# Patient Record
Sex: Female | Born: 1975
Health system: Southern US, Community
[De-identification: ages and names within clinical notes are randomized; demographics above are authoritative.]

## PROBLEM LIST (undated history)

## (undated) DIAGNOSIS — I10 Essential (primary) hypertension: Secondary | ICD-10-CM

## (undated) DIAGNOSIS — D649 Anemia, unspecified: Secondary | ICD-10-CM

## (undated) DIAGNOSIS — G709 Myoneural disorder, unspecified: Secondary | ICD-10-CM

## (undated) DIAGNOSIS — M549 Dorsalgia, unspecified: Secondary | ICD-10-CM

## (undated) DIAGNOSIS — G4700 Insomnia, unspecified: Secondary | ICD-10-CM

## (undated) DIAGNOSIS — G43909 Migraine, unspecified, not intractable, without status migrainosus: Secondary | ICD-10-CM

## (undated) DIAGNOSIS — L309 Dermatitis, unspecified: Secondary | ICD-10-CM

## (undated) DIAGNOSIS — Z9289 Personal history of other medical treatment: Secondary | ICD-10-CM

## (undated) DIAGNOSIS — F32A Depression, unspecified: Secondary | ICD-10-CM

## (undated) DIAGNOSIS — M1990 Unspecified osteoarthritis, unspecified site: Secondary | ICD-10-CM

## (undated) DIAGNOSIS — N938 Other specified abnormal uterine and vaginal bleeding: Secondary | ICD-10-CM

## (undated) DIAGNOSIS — S83281A Other tear of lateral meniscus, current injury, right knee, initial encounter: Secondary | ICD-10-CM

## (undated) DIAGNOSIS — F319 Bipolar disorder, unspecified: Secondary | ICD-10-CM

## (undated) DIAGNOSIS — K219 Gastro-esophageal reflux disease without esophagitis: Secondary | ICD-10-CM

## (undated) DIAGNOSIS — N390 Urinary tract infection, site not specified: Secondary | ICD-10-CM

## (undated) DIAGNOSIS — F329 Major depressive disorder, single episode, unspecified: Secondary | ICD-10-CM

## (undated) DIAGNOSIS — F419 Anxiety disorder, unspecified: Secondary | ICD-10-CM

## (undated) HISTORY — PX: TUBAL LIGATION: SHX77

## (undated) HISTORY — DX: Anxiety disorder, unspecified: F41.9

## (undated) HISTORY — DX: Depression, unspecified: F32.A

## (undated) HISTORY — DX: Essential (primary) hypertension: I10

## (undated) HISTORY — DX: Migraine, unspecified, not intractable, without status migrainosus: G43.909

## (undated) HISTORY — DX: Insomnia, unspecified: G47.00

## (undated) HISTORY — DX: Bipolar disorder, unspecified: F31.9

## (undated) HISTORY — DX: Major depressive disorder, single episode, unspecified: F32.9

## (undated) HISTORY — DX: Other specified abnormal uterine and vaginal bleeding: N93.8

---

## 1999-01-01 ENCOUNTER — Emergency Department (HOSPITAL_COMMUNITY): Admission: EM | Admit: 1999-01-01 | Discharge: 1999-01-01 | Payer: Self-pay | Admitting: Emergency Medicine

## 2000-01-23 ENCOUNTER — Emergency Department (HOSPITAL_COMMUNITY): Admission: EM | Admit: 2000-01-23 | Discharge: 2000-01-23 | Payer: Self-pay | Admitting: Emergency Medicine

## 2001-08-28 ENCOUNTER — Encounter: Payer: Self-pay | Admitting: Family Medicine

## 2001-08-28 ENCOUNTER — Ambulatory Visit (HOSPITAL_COMMUNITY): Admission: RE | Admit: 2001-08-28 | Discharge: 2001-08-28 | Payer: Self-pay | Admitting: Anesthesiology

## 2001-12-11 ENCOUNTER — Emergency Department (HOSPITAL_COMMUNITY): Admission: EM | Admit: 2001-12-11 | Discharge: 2001-12-11 | Payer: Self-pay | Admitting: Emergency Medicine

## 2001-12-21 ENCOUNTER — Emergency Department (HOSPITAL_COMMUNITY): Admission: EM | Admit: 2001-12-21 | Discharge: 2001-12-21 | Payer: Self-pay | Admitting: Emergency Medicine

## 2003-02-04 ENCOUNTER — Emergency Department (HOSPITAL_COMMUNITY): Admission: EM | Admit: 2003-02-04 | Discharge: 2003-02-04 | Payer: Self-pay | Admitting: Emergency Medicine

## 2003-04-07 ENCOUNTER — Emergency Department (HOSPITAL_COMMUNITY): Admission: EM | Admit: 2003-04-07 | Discharge: 2003-04-08 | Payer: Self-pay | Admitting: Emergency Medicine

## 2003-06-25 ENCOUNTER — Inpatient Hospital Stay (HOSPITAL_COMMUNITY): Admission: AD | Admit: 2003-06-25 | Discharge: 2003-06-25 | Payer: Self-pay | Admitting: Obstetrics & Gynecology

## 2004-02-03 ENCOUNTER — Inpatient Hospital Stay (HOSPITAL_COMMUNITY): Admission: AD | Admit: 2004-02-03 | Discharge: 2004-02-03 | Payer: Self-pay | Admitting: Obstetrics and Gynecology

## 2004-02-11 ENCOUNTER — Ambulatory Visit: Payer: Self-pay | Admitting: Obstetrics and Gynecology

## 2004-02-29 ENCOUNTER — Ambulatory Visit: Payer: Self-pay | Admitting: Sports Medicine

## 2004-02-29 ENCOUNTER — Ambulatory Visit (HOSPITAL_COMMUNITY): Admission: RE | Admit: 2004-02-29 | Discharge: 2004-02-29 | Payer: Self-pay | Admitting: Family Medicine

## 2004-04-05 ENCOUNTER — Ambulatory Visit: Payer: Self-pay | Admitting: Family Medicine

## 2004-06-07 ENCOUNTER — Ambulatory Visit: Payer: Self-pay | Admitting: Family Medicine

## 2004-11-01 ENCOUNTER — Ambulatory Visit: Payer: Self-pay | Admitting: Sports Medicine

## 2004-11-03 ENCOUNTER — Ambulatory Visit: Payer: Self-pay | Admitting: Sports Medicine

## 2004-11-04 ENCOUNTER — Ambulatory Visit: Payer: Self-pay | Admitting: Family Medicine

## 2005-12-29 ENCOUNTER — Emergency Department (HOSPITAL_COMMUNITY): Admission: EM | Admit: 2005-12-29 | Discharge: 2005-12-29 | Payer: Self-pay | Admitting: Emergency Medicine

## 2006-03-06 ENCOUNTER — Inpatient Hospital Stay (HOSPITAL_COMMUNITY): Admission: AD | Admit: 2006-03-06 | Discharge: 2006-03-06 | Payer: Self-pay | Admitting: Obstetrics and Gynecology

## 2006-08-02 ENCOUNTER — Emergency Department (HOSPITAL_COMMUNITY): Admission: EM | Admit: 2006-08-02 | Discharge: 2006-08-02 | Payer: Self-pay | Admitting: Emergency Medicine

## 2006-10-20 ENCOUNTER — Emergency Department (HOSPITAL_COMMUNITY): Admission: EM | Admit: 2006-10-20 | Discharge: 2006-10-20 | Payer: Self-pay | Admitting: Emergency Medicine

## 2006-11-10 ENCOUNTER — Emergency Department (HOSPITAL_COMMUNITY): Admission: EM | Admit: 2006-11-10 | Discharge: 2006-11-10 | Payer: Self-pay | Admitting: Emergency Medicine

## 2006-12-20 ENCOUNTER — Other Ambulatory Visit: Admission: RE | Admit: 2006-12-20 | Discharge: 2006-12-20 | Payer: Self-pay | Admitting: Family Medicine

## 2006-12-20 ENCOUNTER — Ambulatory Visit: Payer: Self-pay | Admitting: Family Medicine

## 2006-12-20 ENCOUNTER — Encounter: Payer: Self-pay | Admitting: Family Medicine

## 2006-12-20 DIAGNOSIS — I10 Essential (primary) hypertension: Secondary | ICD-10-CM | POA: Insufficient documentation

## 2006-12-20 LAB — CONVERTED CEMR LAB: Pap Smear: NORMAL

## 2006-12-21 ENCOUNTER — Encounter: Payer: Self-pay | Admitting: Family Medicine

## 2006-12-21 ENCOUNTER — Ambulatory Visit: Payer: Self-pay | Admitting: Family Medicine

## 2006-12-21 LAB — CONVERTED CEMR LAB
ALT: <8 units/L (ref 0–35)
AST: 12 units/L (ref 0–37)
Albumin: 4.1 g/dL (ref 3.5–5.2)
Alkaline Phosphatase: 49 units/L (ref 39–117)
BUN: 13 mg/dL (ref 6–23)
CO2: 22 meq/L (ref 19–32)
Calcium: 8.8 mg/dL (ref 8.4–10.5)
Chloride: 106 meq/L (ref 96–112)
Cholesterol: 190 mg/dL (ref 0–200)
Creatinine, Ser: 0.72 mg/dL (ref 0.40–1.20)
Glucose, Bld: 82 mg/dL (ref 70–99)
HCT: 44.3 % (ref 36.0–46.0)
HDL: 63 mg/dL (ref >39)
Hemoglobin: 14.7 g/dL (ref 12.0–15.0)
LDL Cholesterol: 111 mg/dL — ABNORMAL HIGH (ref 0–99)
MCHC: 33.2 g/dL (ref 30.0–36.0)
MCV: 88.6 fL (ref 78.0–100.0)
Platelets: 232 10*3/uL (ref 150–400)
Potassium: 4.3 meq/L (ref 3.5–5.3)
RBC: 5 M/uL (ref 3.87–5.11)
RDW: 13.8 % (ref 11.5–14.0)
Sodium: 138 meq/L (ref 135–145)
TSH: 0.394 microintl units/mL (ref 0.350–5.50)
Total Bilirubin: 0.8 mg/dL (ref 0.3–1.2)
Total CHOL/HDL Ratio: 3
Total Protein: 6.7 g/dL (ref 6.0–8.3)
Triglycerides: 80 mg/dL (ref <150)
VLDL: 16 mg/dL (ref 0–40)
WBC: 5.6 10*3/uL (ref 4.0–10.5)

## 2006-12-24 ENCOUNTER — Telehealth: Payer: Self-pay | Admitting: Family Medicine

## 2006-12-27 ENCOUNTER — Encounter: Payer: Self-pay | Admitting: Family Medicine

## 2007-03-07 ENCOUNTER — Emergency Department (HOSPITAL_COMMUNITY): Admission: EM | Admit: 2007-03-07 | Discharge: 2007-03-07 | Payer: Self-pay | Admitting: Emergency Medicine

## 2007-03-18 ENCOUNTER — Ambulatory Visit: Payer: Self-pay | Admitting: Sports Medicine

## 2007-03-19 ENCOUNTER — Ambulatory Visit: Payer: Self-pay | Admitting: Family Medicine

## 2007-03-20 ENCOUNTER — Ambulatory Visit: Payer: Self-pay | Admitting: Family Medicine

## 2008-04-06 ENCOUNTER — Ambulatory Visit: Payer: Self-pay | Admitting: Family Medicine

## 2008-04-08 ENCOUNTER — Ambulatory Visit: Payer: Self-pay | Admitting: Family Medicine

## 2008-04-28 ENCOUNTER — Telehealth: Payer: Self-pay | Admitting: Family Medicine

## 2008-08-11 ENCOUNTER — Emergency Department (HOSPITAL_COMMUNITY): Admission: EM | Admit: 2008-08-11 | Discharge: 2008-08-11 | Payer: Self-pay | Admitting: Family Medicine

## 2008-09-09 ENCOUNTER — Telehealth: Payer: Self-pay | Admitting: Family Medicine

## 2008-10-29 ENCOUNTER — Ambulatory Visit: Payer: Self-pay | Admitting: Family Medicine

## 2008-10-29 ENCOUNTER — Encounter: Payer: Self-pay | Admitting: Family Medicine

## 2008-10-29 DIAGNOSIS — E66811 Obesity, class 1: Secondary | ICD-10-CM | POA: Insufficient documentation

## 2008-10-29 DIAGNOSIS — E669 Obesity, unspecified: Secondary | ICD-10-CM | POA: Insufficient documentation

## 2008-11-03 ENCOUNTER — Encounter: Payer: Self-pay | Admitting: Family Medicine

## 2008-11-03 LAB — CONVERTED CEMR LAB
BUN: 16 mg/dL (ref 6–23)
CO2: 26 meq/L (ref 19–32)
Calcium: 9 mg/dL (ref 8.4–10.5)
Chloride: 103 meq/L (ref 96–112)
Creatinine, Ser: 0.77 mg/dL (ref 0.40–1.20)
Glucose, Bld: 86 mg/dL (ref 70–99)
Potassium: 4 meq/L (ref 3.5–5.3)
Sodium: 141 meq/L (ref 135–145)

## 2008-12-24 ENCOUNTER — Emergency Department (HOSPITAL_COMMUNITY): Admission: EM | Admit: 2008-12-24 | Discharge: 2008-12-24 | Payer: Self-pay | Admitting: Emergency Medicine

## 2009-01-13 ENCOUNTER — Telehealth: Payer: Self-pay | Admitting: Family Medicine

## 2009-01-13 ENCOUNTER — Ambulatory Visit: Payer: Self-pay | Admitting: Family Medicine

## 2009-01-23 ENCOUNTER — Encounter (INDEPENDENT_AMBULATORY_CARE_PROVIDER_SITE_OTHER): Payer: Self-pay | Admitting: *Deleted

## 2009-01-23 DIAGNOSIS — F17200 Nicotine dependence, unspecified, uncomplicated: Secondary | ICD-10-CM | POA: Insufficient documentation

## 2009-03-13 DIAGNOSIS — S83519A Sprain of anterior cruciate ligament of unspecified knee, initial encounter: Secondary | ICD-10-CM

## 2009-03-13 HISTORY — DX: Sprain of anterior cruciate ligament of unspecified knee, initial encounter: S83.519A

## 2009-06-29 ENCOUNTER — Ambulatory Visit: Payer: Self-pay | Admitting: Family Medicine

## 2009-06-29 DIAGNOSIS — G4700 Insomnia, unspecified: Secondary | ICD-10-CM | POA: Insufficient documentation

## 2009-09-08 ENCOUNTER — Telehealth: Payer: Self-pay | Admitting: Family Medicine

## 2009-09-08 ENCOUNTER — Ambulatory Visit: Payer: Self-pay | Admitting: Family Medicine

## 2009-09-08 LAB — CONVERTED CEMR LAB: Beta hcg, urine, semiquantitative: POSITIVE

## 2009-09-09 ENCOUNTER — Telehealth: Payer: Self-pay | Admitting: Family Medicine

## 2009-09-28 ENCOUNTER — Ambulatory Visit: Payer: Self-pay | Admitting: Family Medicine

## 2009-10-06 ENCOUNTER — Telehealth: Payer: Self-pay | Admitting: Family Medicine

## 2009-10-27 ENCOUNTER — Emergency Department (HOSPITAL_COMMUNITY)
Admission: EM | Admit: 2009-10-27 | Discharge: 2009-10-27 | Payer: Self-pay | Source: Home / Self Care | Admitting: Emergency Medicine

## 2009-10-27 ENCOUNTER — Emergency Department (HOSPITAL_COMMUNITY): Admission: EM | Admit: 2009-10-27 | Discharge: 2009-10-27 | Payer: Self-pay | Admitting: Emergency Medicine

## 2009-10-31 ENCOUNTER — Emergency Department (HOSPITAL_COMMUNITY): Admission: EM | Admit: 2009-10-31 | Discharge: 2009-10-31 | Payer: Self-pay | Admitting: Emergency Medicine

## 2009-11-03 ENCOUNTER — Ambulatory Visit: Payer: Self-pay | Admitting: Family Medicine

## 2009-11-03 DIAGNOSIS — G8929 Other chronic pain: Secondary | ICD-10-CM | POA: Insufficient documentation

## 2009-11-03 DIAGNOSIS — M25569 Pain in unspecified knee: Secondary | ICD-10-CM | POA: Insufficient documentation

## 2009-11-04 ENCOUNTER — Ambulatory Visit (HOSPITAL_COMMUNITY): Admission: RE | Admit: 2009-11-04 | Discharge: 2009-11-04 | Payer: Self-pay | Admitting: Family Medicine

## 2009-11-05 ENCOUNTER — Telehealth: Payer: Self-pay | Admitting: Family Medicine

## 2009-11-05 DIAGNOSIS — M23349 Other meniscus derangements, anterior horn of lateral meniscus, unspecified knee: Secondary | ICD-10-CM | POA: Insufficient documentation

## 2009-11-11 ENCOUNTER — Telehealth: Payer: Self-pay | Admitting: *Deleted

## 2009-11-12 ENCOUNTER — Encounter: Payer: Self-pay | Admitting: Family Medicine

## 2009-11-12 ENCOUNTER — Ambulatory Visit: Payer: Self-pay | Admitting: Family Medicine

## 2009-11-17 ENCOUNTER — Encounter (INDEPENDENT_AMBULATORY_CARE_PROVIDER_SITE_OTHER): Payer: Self-pay | Admitting: *Deleted

## 2009-11-17 ENCOUNTER — Encounter: Payer: Self-pay | Admitting: Family Medicine

## 2009-11-17 ENCOUNTER — Ambulatory Visit: Payer: Self-pay | Admitting: Sports Medicine

## 2009-11-17 DIAGNOSIS — S83006A Unspecified dislocation of unspecified patella, initial encounter: Secondary | ICD-10-CM | POA: Insufficient documentation

## 2009-12-08 ENCOUNTER — Ambulatory Visit: Payer: Self-pay | Admitting: Sports Medicine

## 2009-12-08 ENCOUNTER — Encounter: Payer: Self-pay | Admitting: Family Medicine

## 2009-12-08 ENCOUNTER — Encounter (INDEPENDENT_AMBULATORY_CARE_PROVIDER_SITE_OTHER): Payer: Self-pay | Admitting: *Deleted

## 2009-12-09 ENCOUNTER — Telehealth: Payer: Self-pay | Admitting: Family Medicine

## 2009-12-27 ENCOUNTER — Encounter
Admission: RE | Admit: 2009-12-27 | Discharge: 2010-01-11 | Payer: Self-pay | Source: Home / Self Care | Admitting: Family Medicine

## 2009-12-27 ENCOUNTER — Encounter: Payer: Self-pay | Admitting: Family Medicine

## 2010-04-14 NOTE — Progress Notes (Signed)
  Phone Note Refill Request Call back at [phone]   Refills Requested: Medication #1:  PERCOCET 5-325 MG TABS Take 1-2 tabs by mouth every 6 hours as needed for pain Patient out of pain meds and need refilled.  Please send to [facility] on [address]  Initial call taken by: Abundio Miu,  December 09, 2009 2:47 PM  Follow-up for Phone Call        Narcotic, did not call this in.  Placed prescription in "to be called" box so that patient would be contacted to pick this up.  If she requires further pain medications she should make an appointment Follow-up by: Renold Don MD,  December 09, 2009 8:45 PM  Additional Follow-up for Phone Call Additional follow up Details #1::        Pt notified that rx ready for pick up and if more is required appt will need to be made. Additional Follow-up by: Clydell Hakim,  December 10, 2009 10:00 AM    Prescriptions: PERCOCET 5-325 MG TABS (OXYCODONE-ACETAMINOPHEN) Take 1-2 tabs by mouth every 6 hours as needed for pain  #45 x 0   Entered and Authorized by:   Renold Don MD   Signed by:   Renold Don MD on 12/09/2009   Method used:   Print then Give to Patient   RxID:   [id number]

## 2010-04-14 NOTE — Letter (Signed)
Summary: Out of Work  [facility]  [address]   [place], [place] [address]   Phone: [phone]  Fax: [phone]    November 17, 2009   Employee:  [employer]    To Whom It May Concern:   For Medical reasons, please excuse the above named employee from work for the following dates:  Start:  November 17, 2009 3:25 PM   End:  December 08, 2009   If you need additional information, please feel free to contact our office.         Sincerely,    Lillia Pauls CMA

## 2010-04-14 NOTE — Assessment & Plan Note (Signed)
Summary: f/u,df   Vital Signs:  Patient profile:   35 year old female Height:      65 inches Weight:      179.0 pounds BMI:     29.89 Temp:     98.6 degrees F oral Pulse rate:   103 / minute BP sitting:   165 / 117  (left arm) Cuff size:   regular  Vitals Entered By: Gladstone Pih (September 28, 2009 2:39 PM)  Serial Vital Signs/Assessments:  Comments: 3:08 PM Manual BP: 148/96 By: Garen Grams LPN   CC: F/U, discuss preg and HTN med change Is Patient Diabetic? No Pain Assessment Patient in pain? no        Primary Care Provider:  Marisue Ivan  MD  CC:  F/U and discuss preg and HTN med change.  History of Present Illness: Patient here to discuss that she is pregnant and hypertension:  1.  Pregnancy:  Patient with LMP in June 15.  States she took home pregnancy test, verified here in office.  Unsure whether she wants to keep pregnancy .Taking prenatal vitamins.  Has information on various agencies in town that perform abortions.    2.  Hypertension:  measures blood pressures at home.  States they have been running 140s/150s, higher since being taken off HCTZ and placed on Labetolol.  States she is taking her medicine every single day twice daily as recommended.  Occasional lightheadedness when pressures high.  ROS:  no headaches, vision changes, chest pain, dyspnea, nausea/vomiting, changes in bowel habits, lower extremity edema   Habits & Providers  Alcohol-Tobacco-Diet     Tobacco Status: current     Tobacco Counseling: to quit use of tobacco products     Cigarette Packs/Day: <0.25  Current Problems (verified): 1)  Pregnant State, Incidental  (ICD-V22.2) 2)  Insomnia, Chronic  (ICD-307.42) 3)  Back Pain, Lumbar  (ICD-724.2) 4)  Tobacco User  (ICD-305.1) 5)  Obesity  (ICD-278.00) 6)  Preventive Health Care  (ICD-V70.0) 7)  Hypertension  (ICD-401.9)  Current Medications (verified): 1)  Naproxen 500 Mg Tabs (Naproxen) .Marland Kitchen.. 1 Tab By Mouth Two Times A Day With  Food X 10 Days 2)  Labetalol Hcl 100 Mg Tabs (Labetalol Hcl) .... Two Tabs in Am and 1 Tab in Pm 3)  Ondansetron Hcl 4 Mg Tabs (Ondansetron Hcl) .... One Tab By Mouth Q6 Hrs As Needed Nausea 4)  Promethazine Hcl 12.5 Mg Tabs (Promethazine Hcl) .... One Tab By Mouth Q 6hrs As Needed Nausea 5)  Prenavite Multiple Vitamin 28-0.8 Mg Tabs (Prenatal Vit-Fe Fumarate-Fa) .... One Tab By Mouth Daily  Allergies (verified): 1)  ! Imitrex (Sumatriptan Succinate)  Past History:  Past medical, surgical, family and social histories (including risk factors) reviewed, and no changes noted (except as noted below).  Past Medical History: Reviewed history from 12/20/2006 and no changes required. 1. HTN 2. Allergic rhinitis 3. Migraines  Past Surgical History: Reviewed history from 12/20/2006 and no changes required. BTL  Family History: Reviewed history from 12/20/2006 and no changes required. 1. Sister- bipolar 2. Cousins- bipolar 3. Mother- HTN 4. Brother- Htn  Social History: Reviewed history from 12/20/2006 and no changes required. Lives in apt with 17yo daughter, 15yo son, 11yo daughter.  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  occasional EtOH.  No other drugsPacks/Day:  <0.25  Physical Exam  General:  Vital signs reviewed. Well-developed, well-nourished patient in NAD.  Awake and cooperative  Mouth:  oral mucosa moist Lungs:  clear to auscultation bilaterally without wheezing, rales, or rhonchi.  Normal work of breathing  Heart:  Regular rate and rhythm without murmur, rub, or gallop.  Normal S1/S2  Abdomen:  soft/ND/NT.  Fundal height non-palpable, not able to auscultate FHTs.  good BS   Impression & Recommendations:  Problem # 1:  PREGNANT STATE, INCIDENTAL (ICD-V22.2) Assessment Unchanged Patient needs prenatal labs drawn.  Declined blood draw today, fear of needles.  Discussed with her importance of this.  Will need official new OB visit as well as prenatal lab draws  prior to being seen at [facility], where patient needs to be referred due to pre-gestational HTN.  Currrently doing well, no complaints, emesis improved.  Patient still undecided over pregnancy, urged her to continue taking PNV while she makes decision and to stop smoking.   Orders: Obstetric Referral (Obstetric) Other OB visit- [facility] ([facility])  Problem # 2:  HYPERTENSION (ICD-401.9) Assessment: Deteriorated Patient with worsening HTN after being taken off HCTZ.  Plan to increase Labetolol today, want to see her back next week but patient states it will have to be 2 weeks due to finances.  Even after pressing her on this issues, patient adament 2 week return visit.  No current red flags or symptoms of pre-eclampsia.  Will recheck blood pressure at that time as well as obtain UA for protein and prenatal labs as above.   Her updated medication list for this problem includes:    Labetalol Hcl 100 Mg Tabs (Labetalol hcl) .Marland Kitchen..Marland Kitchen Two tabs in am and 1 tab in pm  Orders: Obstetric Referral (Obstetric)  Complete Medication List: 1)  Naproxen 500 Mg Tabs (Naproxen) .Marland Kitchen.. 1 tab by mouth two times a day with food x 10 days 2)  Labetalol Hcl 100 Mg Tabs (Labetalol hcl) .... Two tabs in am and 1 tab in pm 3)  Ondansetron Hcl 4 Mg Tabs (Ondansetron hcl) .... One tab by mouth q6 hrs as needed nausea 4)  Promethazine Hcl 12.5 Mg Tabs (Promethazine hcl) .... One tab by mouth q 6hrs as needed nausea 5)  Prenavite Multiple Vitamin 28-0.8 Mg Tabs (Prenatal vit-fe fumarate-fa) .... One tab by mouth daily Prescriptions: LABETALOL HCL 100 MG TABS (LABETALOL HCL) two tabs in AM and 1 tab in PM  #60 x 2   Entered and Authorized by:   Renold Don MD   Signed by:   Renold Don MD on 09/28/2009   Method used:   Electronically to        [facility] [phone]* (retail)       [address]       [place], [place]  [address]       Ph: [phone]       Fax: [phone]   RxID:   [id number]
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
## 2010-04-14 NOTE — Progress Notes (Signed)
Summary: referral prob  Phone Note Call from Patient Call back at Home Phone [phone]   Caller: Patient Summary of Call: pt has [facility] and [facility] doesn't take that - needs to go somewhere else  also needs a note to go back to work next week instead of tomorrow Initial call taken by: De Nurse,  November 11, 2009 10:43 AM  Follow-up for Phone Call        Informed pt that referral would have to go thru [facility], [place], or [place]. Pt c/o of increased pain and doesnt believe she can go back to work Monday. Will be in tomm (9/2) to address these concerns with PCP. Follow-up by: Garen Grams LPN,  November 11, 2009 4:51 PM

## 2010-04-14 NOTE — Progress Notes (Signed)
Summary: triage  Phone Note Call from Patient Call back at Home Phone [phone]   Caller: Patient Summary of Call: n/v - stomach pain - wants to come in today Initial call taken by: De Nurse,  September 08, 2009 11:10 AM  Follow-up for Phone Call        sick x 6 days. states she is feeling worse. appt today in work in . aware of wait Follow-up by: Golden Circle RN,  September 08, 2009 11:34 AM

## 2010-04-14 NOTE — Progress Notes (Signed)
Summary: meds prob  Phone Note Call from Patient Call back at Home Phone [phone]   Caller: Patient Summary of Call: pt cannot afford meds from yesterday - needs it called into [facility]  Initial call taken by: De Nurse,  September 09, 2009 8:34 AM  Follow-up for Phone Call        LM for HD to call me back. need to know if they will fill rxs for person with orange card. called pt & told her what I have done so far. told her I will call her back with the answer when I hear from them states she does not feel any better Follow-up by: Golden Circle RN,  September 09, 2009 8:55 AM  Additional Follow-up for Phone Call Additional follow up Details #1::        spoke with pharmacist. they only have & will fill the phenergan. called pt about the others told her the labetalol is on a low cost plan at [facility]. she asked for them to be [facility]  told her the ondansetron will never be on a low cost list. she is appyling for medicaid since she is pregnant. states she is on her way there now.  told her medicaid only pays for this drug if she is post op or undergoing chemo. she is unable to pay cash. once she gets the pink medicaid they will pay for prenatal  vits. can buy it herself today or get with medicaid. she called back asking about the naproxyn. told her she cannot take it sinc she is pregnant. told her tylenol only for any aches/pains. Additional Follow-up by: Golden Circle RN,  September 09, 2009 1:57 PM    Additional Follow-up for Phone Call Additional follow up Details #2::    Sent labetalol to requested pharmacy. Please let patient know. thanks!  Follow-up by: Bobby Rumpf  MD,  September 10, 2009 11:49 AM  Prescriptions: LABETALOL HCL 100 MG TABS (LABETALOL HCL) one tab by mouth two times a day  #60 x 1   Entered and Authorized by:   Bobby Rumpf  MD   Signed by:   Bobby Rumpf  MD on 09/10/2009   Method used:   Electronically to        [facility]  [address] [phone]* (retail)       [address] [place], [place]  [address]       Ph: [phone]       Fax: [phone]   RxID:   [id number] PROMETHAZINE HCL 12.5 MG TABS (PROMETHAZINE HCL) one tab by mouth q 6hrs as needed nausea  #30 x 1   Entered by:   Golden Circle RN   Authorized by:   Bobby Rumpf  MD   Signed by:   Golden Circle RN on 09/09/2009   Method used:   Printed then faxed to ...       [facility] (retail)       [address] [place], [place]  [address]       Ph: [phone]       Fax: [phone]   RxID:   [id number]

## 2010-04-14 NOTE — Miscellaneous (Signed)
Summary: [facility]  [facility]   Imported By: Marily Memos 01/13/2010 15:32:31  _____________________________________________________________________  External Attachment:    Type:   Image     Comment:   External Document

## 2010-04-14 NOTE — Miscellaneous (Signed)
Summary: [facility]  [facility]   Imported By: Marily Memos 01/12/2010 09:06:27  _____________________________________________________________________  External Attachment:    Type:   Image     Comment:   External Document

## 2010-04-14 NOTE — Letter (Signed)
Summary: [facility] PT Referral form  [facility] PT Referral form   Imported By: Marily Memos 12/09/2009 10:35:14  _____________________________________________________________________  External Attachment:    Type:   Image     Comment:   External Document

## 2010-04-14 NOTE — Assessment & Plan Note (Signed)
Summary: MENISCAL PAIN/MJD   Vital Signs:  Patient profile:   35 year old female BP sitting:   129 / 84  Vitals Entered By: Lillia Pauls CMA (November 17, 2009 2:42 PM)  Primary Provider:  Renold Don MD   History of Present Illness: 35 yo F with 3 weeks of R knee pain s/p fall referred by PCP Renold Don. Slipped and fell backward 3 weeks ago, had immediate pain and swelling after getting up. Has continued to have diffuse pain and swelling. MRI that showed possible flap tear of ant horn lat meniscuse, MPFL and medial patellar retinacular tear, grade II MCL and LCL injuries, chondral bruising, and possible posterolateral corner injury. Still using crutches to get up and down steps.  Using an ACE wrap bandage. Alternating ice and heat.  Elevating as well. Does not remember patella dislocating, but states it has happened in the past.  Also lost her pregnancy with this fall.   ~ [redacted] weeks pregnant.   Allergies: 1)  ! Imitrex (Sumatriptan Succinate)  Past History:  Past Medical History: Last updated: 12/20/2006 1. HTN 2. Allergic rhinitis 3. Migraines  Past Surgical History: Last updated: 12/20/2006 BTL  Family History: Last updated: 12/20/2006 1. Sister- bipolar 2. Cousins- bipolar 3. Mother- HTN 4. Brother- Htn  Social History: Last updated: 12/20/2006 Lives in apt with 17yo daughter, 15yo son, 11yo daughter.  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  occasional EtOH.  No other drugs  Risk Factors: Smoking Status: current (11/12/2009) Packs/Day: <0.25 (11/12/2009)  Physical Exam  General:  Well-developed,well-nourished,in no acute distress; alert,appropriate and cooperative throughout examination Head:  normocephalic.   Eyes:  vision grossly intact.   Nose:  no external deformity.   Neck:  supple.   Lungs:  normal respiratory effort.   Abdomen:  soft.   Msk:  R Knee: No erythema, + moderate effusion. Palpation with b/l mild joint line ttp and  diffuse condylar ttp. ROM decreased with extension at  ~3-5 deg and flex to 100-110 deg. ACL and PCL intact.  MCL and LCL with pain and mild laxity compared to contralateral side. Equivocal McMurrays difficult to ascertain based on pain and swelling.  - bounce home. Mildly painful patellar compression, + apprehension.  Neurologic:  alert & oriented X3.     Impression & Recommendations:  Problem # 1:  KNEE PAIN, RIGHT (ICD-719.46)  Multiple factors contributing to her pain after this fall.  Most concerning are MPFL and med patellar retinacular tears which increase risk of repeat patellar dislocation, as well as possible meniscal tear.  Overall, pt does feel her pain and swelling are improved. - knee immobilzer x 3 weeks - isometric quad strengthening exercises - pt states she may have medicaid in 3 weeks, and if still in considerable pain with unstable knee, would refer for ortho consult at her f/u visit - f/u 3 weeks  Her updated medication list for this problem includes:    Naproxen 500 Mg Tabs (Naproxen) .Marland Kitchen... 1 tab by mouth two times a day with food x 10 days    Percocet 5-325 Mg Tabs (Oxycodone-acetaminophen) .Marland Kitchen... Take 1-2 tabs by mouth every 6 hours as needed for pain  Her updated medication list for this problem includes:    Naproxen 500 Mg Tabs (Naproxen) .Marland Kitchen... 1 tab by mouth two times a day with food x 10 days    Percocet 5-325 Mg Tabs (Oxycodone-acetaminophen) .Marland Kitchen... Take 1-2 tabs by mouth every 6 hours as needed for  pain  Problem # 2:  DERANGEMENT OF ANTERIOR HORN OF LATERAL MENISCUS (ICD-717.42) Unclear from MRI if truly has bucket handle tear - knee immobilizer x 3 weeks then f/u at [facility] - would consider scope and menisectomy if still in pain in 3 weeks  Orders: Knee Immobilizer any size (O9629)  Problem # 3:  PATELLAR DISLOCATION, RIGHT (ICD-836.3) Based on MRI findings, very concerning for repeat patellar dislocation.  Since has had another prior to this one, she is  at large risk of recurrent dislocations and really needs MPFL and retinaculum fixed.  However, currently without good insurance. - knee immobilizer x 3 weeks - if no better, ortho consult whenever gets medicaid - f/u 3 weeks  Complete Medication List: 1)  Naproxen 500 Mg Tabs (Naproxen) .Marland Kitchen.. 1 tab by mouth two times a day with food x 10 days 2)  Ondansetron Hcl 4 Mg Tabs (Ondansetron hcl) .... One tab by mouth q6 hrs as needed nausea 3)  Promethazine Hcl 12.5 Mg Tabs (Promethazine hcl) .... One tab by mouth q 6hrs as needed nausea 4)  Prenavite Multiple Vitamin 28-0.8 Mg Tabs (Prenatal vit-fe fumarate-fa) .... One tab by mouth daily 5)  Percocet 5-325 Mg Tabs (Oxycodone-acetaminophen) .... Take 1-2 tabs by mouth every 6 hours as needed for pain 6)  Hydrochlorothiazide 25 Mg Tabs (Hydrochlorothiazide) .... Take 1 pill daily po

## 2010-04-14 NOTE — Letter (Signed)
Summary: *Consult Note  [facility]  [address]   [place], [place] [address]   Phone: [phone]  Fax: [phone]    Re:    Kristin [name] DOB:    15-May-1975   Dear: Dr. Gwendolyn Grant   Thank you for requesting that we see the above patient for consultation.  A copy of the detailed office note will be sent under separate cover, for your review.  Evaluation today is consistent with: R knee pain with multiple damaged structures   Our recommendation is for: immobilizer for 3 weeks and then follow up in sports med clinic   New Orders include:    New Medications started today include:    After today's visit, the patients current medications include: 1)  NAPROXEN 500 MG TABS (NAPROXEN) 1 tab by mouth two times a day with food x 10 days 2)  ONDANSETRON HCL 4 MG TABS (ONDANSETRON HCL) one tab by mouth q6 hrs as needed nausea 3)  PROMETHAZINE HCL 12.5 MG TABS (PROMETHAZINE HCL) one tab by mouth q 6hrs as needed nausea 4)  PRENAVITE MULTIPLE VITAMIN 28-0.8 MG TABS (PRENATAL VIT-FE FUMARATE-FA) one tab by mouth daily 5)  PERCOCET 5-325 MG TABS (OXYCODONE-ACETAMINOPHEN) Take 1-2 tabs by mouth every 6 hours as needed for pain 6)  HYDROCHLOROTHIAZIDE 25 MG TABS (HYDROCHLOROTHIAZIDE) Take 1 pill daily po   Thank you for this consultation.  If you have any further questions regarding the care of this patient, please do not hesitate to contact me @ [phone]  Thank you for this opportunity to look after your patient.  Sincerely,   Corbin Ade MD Sports Medicine [facility]

## 2010-04-14 NOTE — Letter (Signed)
Summary: Out of Work  [facility]  [address]   [place], [place] [address]   Phone: [phone]  Fax: [phone]    December 08, 2009   Employee:  [employer]    To Whom It May Concern:   For Medical reasons, please excuse the above named employee from certain work activities. She has work restrictions of :  No lifting over 15 lbs No lifting, bending, or stooping   If you need additional information, please feel free to contact our office.         Sincerely,    Corbin Ade, M.D./Neeton Christell Constant CMA

## 2010-04-14 NOTE — Progress Notes (Signed)
  Phone Note Outgoing Call   Call placed by: Renold Don MD,  October 06, 2009 2:44 PM Summary of Call: Spoke with Ms Panik on phone regarding [facility].  Patient needs to be seen here with prenatal labs drawn before being referred to [facility].  Discussed scheduling an appt here this week or next, sooner rather than later to get her blood pressure under control.  Patient expressed understanding and agreed with plan, will call after looking at schedule.

## 2010-04-14 NOTE — Assessment & Plan Note (Signed)
Summary: n&v x 6 days/[place]   Vital Signs:  Patient profile:   35 year old female Height:      65 inches Weight:      176.6 pounds BMI:     29.49 Temp:     98.4 degrees F oral Pulse rate:   88 / minute BP sitting:   144 / 89  (left arm) Cuff size:   regular  Vitals Entered By: Gladstone Pih (September 08, 2009 4:29 PM) CC: C/O N/V x6 days Is Patient Diabetic? No Pain Assessment Patient in pain? no      Comments LMPmay 27th   Primary Care Provider:  Marisue Ivan  MD  CC:  C/O N/V x6 days.  History of Present Illness: 1) Emesis and nausea: Started six days ago with nausea - progressed to emesis about three days ago, approximately three times a day with trying to eat (non bloody, non bilious). LMP May 27th. Mild abdominal pain epigastric - occurs when vomiting. Denies cough, runny nose, sore throat, fever, chills, sick contact, association of pain with position, diarrhea, melena, hematochezia, neurological signs.   Urine pregnancy today = POSITIVE  [id number]. History of hyperemesis gravidarum. Unplanned pregnancy. Unsure if she wishes to keep vs. terminate. 4 weeks  5 days EGA by LMP. Periods usually regular, 28 - 30 days apart.    2) Hypertension: On HCTZ. Denies chest pain, LE edema, dyspnea, headache. Would like refill on HCTZ.   3) Tobacco use: 1/4 pack per day. Wants to quit now that she is pregnant. Denies chronic cough.   see [facility] for medication reconciliation  Habits & Providers  Alcohol-Tobacco-Diet     Tobacco Status: current     Tobacco Counseling: to quit use of tobacco products     Cigarette Packs/Day: 0.25  Medications Prior to Update: 1)  Hydrochlorothiazide 25 Mg  Tabs (Hydrochlorothiazide) .... Take 1 Tab By Mouth Every Morning 2)  Naproxen 500 Mg Tabs (Naproxen) .Marland Kitchen.. 1 Tab By Mouth Two Times A Day With Food X 10 Days  Allergies (verified): 1)  ! Imitrex (Sumatriptan Succinate)  Social History: Packs/Day:  0.25  Review of Systems   as per HPI o/w negative   Physical Exam  General:  VS Reviewed. Well appearing, NAD.  Lungs:  Normal respiratory effort, chest expands symmetrically. Lungs are clear to auscultation, no crackles or wheezes. Heart:  Normal rate and regular rhythm. S1 and S2 normal without gallop, murmur, click, rub or other extra sounds. Abdomen:  soft, NT, ND, +BS  uterus < 10 weeks in size  Extremities:  no edema  Neurologic:  alert & oriented X3 and cranial nerves II-XII intact.     Impression & Recommendations:  Problem # 1:  PREGNANT STATE, INCIDENTAL (ICD-V22.2) +ve pregancy. uncertain about desire to terminate pregnancy. Advised to stop tobacco use, and alcohol use. Start prenatal vitamin. Apply for Medicaid. Patient to follow up with Dr. Gwendolyn Grant regarding plans for this pregnancy in one week. If desires to keep pregnancy would need referral to [facility] given hypertension (but would need prenatal labs and [place] done here first). Will change to labetalol today (from HCTZ). Will treat emesis with phenergan and Zofran. Also Advanced Maternal Age.  Orders: [facility]- Est  Level 4 (25956)  Problem # 2:  EMESIS (ICD-787.03) Assessment: New  Related to problem #1. Treated with phenergan and zofran. Advised regarding smaller, more frequent meals.   Orders: [facility]- Est  Level 4 (38756)  Problem # 3:  TOBACCO USER (ICD-305.1)  Assessment: Unchanged  Advised to quit smoking especially given pregnancy, offered assistance - patient wants to quit on her own. Would have PCP follow this issue.   Orders: [facility]- Est  Level 4 (95284)  Problem # 4:  HYPERTENSION (ICD-401.9) Assessment: Unchanged  Will change to labetalol (from HCTZ as below) given pregnancy. Follow up with PCP in one week.   The following medications were removed from the medication list:    Hydrochlorothiazide 25 Mg Tabs (Hydrochlorothiazide) .Marland Kitchen... Take 1 tab by mouth every morning Her updated medication list for this problem  includes:    Labetalol Hcl 100 Mg Tabs (Labetalol hcl) ..... One tab by mouth two times a day  BP today: 144/89 Prior BP: 135/81 (06/29/2009)  Labs Reviewed: K+: 4.0 (10/29/2008) Creat: : 0.77 (10/29/2008)   Chol: 190 (12/21/2006)   HDL: 63 (12/21/2006)   LDL: 111 (12/21/2006)   TG: 80 (12/21/2006)  Orders: [facility]- Est  Level 4 (99214)  Complete Medication List: 1)  Naproxen 500 Mg Tabs (Naproxen) .Marland Kitchen.. 1 tab by mouth two times a day with food x 10 days 2)  Labetalol Hcl 100 Mg Tabs (Labetalol hcl) .... One tab by mouth two times a day 3)  Ondansetron Hcl 4 Mg Tabs (Ondansetron hcl) .... One tab by mouth q6 hrs as needed nausea 4)  Promethazine Hcl 12.5 Mg Tabs (Promethazine hcl) .... One tab by mouth q 6hrs as needed nausea 5)  Prenavite Multiple Vitamin 28-0.8 Mg Tabs (Prenatal vit-fe fumarate-fa) .... One tab by mouth daily  Other Orders: U Preg-[facility] (13244)  Patient Instructions: 1)  Stop taking hydrochlorothiazide. 2)  Start taking labetolol for blood pressure control. 3)  I know this pregnancy comes as a surprise for you.  4)  Follow up in one week with your new doctor Dr. Gwendolyn Grant to discuss your plans regarding this pregnancy. If you plan on keeping this pregnancy we will need to refer you to the [facility] [place] [facility] 5)  Take promethazine and ondansetron to help with nausea as directed 6)  Eat smaller more frequent meals to help nausea. 7)  Take prenatal vitamin.  Prescriptions: PRENAVITE MULTIPLE VITAMIN 28-0.8 MG TABS (PRENATAL VIT-FE FUMARATE-FA) one tab by mouth daily  #30 x 3   Entered and Authorized by:   Bobby Rumpf  MD   Signed by:   Bobby Rumpf  MD on 09/08/2009   Method used:   Electronically to        [facility] [address] [phone]* (retail)       [address]       [place], [place]  [address]       Ph: [phone]       Fax: [phone]   RxID:   [id number] PROMETHAZINE HCL 12.5 MG TABS (PROMETHAZINE HCL) one tab by mouth q 6hrs as needed  nausea  #30 x 1   Entered and Authorized by:   Bobby Rumpf  MD   Signed by:   Bobby Rumpf  MD on 09/08/2009   Method used:   Electronically to        [facility] [address] [phone]* (retail)       [address]       [place], [place]  [address]       Ph: [phone]       Fax: [phone]   RxID:   [id number] ONDANSETRON HCL 4 MG TABS (ONDANSETRON HCL) one tab by mouth q6 hrs as needed nausea  #30 x 1  Entered and Authorized by:   Bobby Rumpf  MD   Signed by:   Bobby Rumpf  MD on 09/08/2009   Method used:   Electronically to        [facility] [address] [phone]* (retail)       [address]       [place], [place]  [address]       Ph: [phone]       Fax: [phone]   RxID:   [id number] LABETALOL HCL 100 MG TABS (LABETALOL HCL) one tab by mouth two times a day  #60 x 1   Entered and Authorized by:   Bobby Rumpf  MD   Signed by:   Bobby Rumpf  MD on 09/08/2009   Method used:   Electronically to        [facility] [address] [phone]* (retail)       [address]       [place], [place]  [address]       Ph: [phone]       Fax: [phone]   RxID:   [id number]   Laboratory Results   Urine Tests  Date/Time Received: September 08, 2009 4:42 PM  Date/Time Reported: September 08, 2009 4:44 PM     Urine HCG: positive Comments: ............................................... Delora Fuel September 08, 2009 4:44 PM

## 2010-04-14 NOTE — Assessment & Plan Note (Signed)
Summary: F/U LEG,MC   Vital Signs:  Patient profile:   35 year old female BP sitting:   145 / 89  Vitals Entered By: Lillia Pauls CMA (December 08, 2009 2:00 PM)  Primary Provider:  Renold Don MD   History of Present Illness: 35 yo F here for f/u R knee pain.  Fall 6 weeks ago.  Had MRI with MPFL and med ret tear.  Also with lat men tear.  Also Gr 2 MCL and LCL sprains. Also possible PLC injury. Has been in immobilizer since 3 weeks ago. Some walking in the immobilizer Knee feeling better.  Mostly pain at night. Swelling has gotten better. Has been doing straight leg raises. Unsure if will get medicaid or not. Meets again with them soon.  Allergies: 1)  ! Imitrex (Sumatriptan Succinate)  Physical Exam  General:  Well-developed,well-nourished,in no acute distress; alert,appropriate and cooperative throughout examination Msk:  Knee: Normal to inspection with no erythema, mild effusion . Palpation normal with no warmth or joint line tenderness or patellar tenderness or condyle tenderness.  Mild ttp around patellar rim. ROM full ext, flex 120 deg. Ligaments with solid consistent endpoints at ACL, PCL, only mild laxity at LCL/MCL. Negative Mcmurray's and provocative meniscal tests. Non painful patellar compression and neg apprehension. Patellar and quadriceps tendons unremarkable.    Impression & Recommendations:  Problem # 1:  PATELLAR DISLOCATION, RIGHT (ICD-836.3) Assessment Improved Much improvement. - placed in patellar stabilizing brace since LCL and MCL ligaments essentially stable. - referred to [facility] - f/u 3 weeks  Orders: Patella / Knee brace (A2130)  Problem # 2:  DERANGEMENT OF ANTERIOR HORN OF LATERAL MENISCUS (ICD-717.42) Assessment: Improved May be cause of some continued popping and pain. - PT referral - patellar stabilizing brace - f/u 3 weeks, if gets medicaid, likely consider ortho consult for possible menisectomy  Complete Medication  List: 1)  Naproxen 500 Mg Tabs (Naproxen) .Marland Kitchen.. 1 tab by mouth two times a day with food x 10 days 2)  Ondansetron Hcl 4 Mg Tabs (Ondansetron hcl) .... One tab by mouth q6 hrs as needed nausea 3)  Promethazine Hcl 12.5 Mg Tabs (Promethazine hcl) .... One tab by mouth q 6hrs as needed nausea 4)  Prenavite Multiple Vitamin 28-0.8 Mg Tabs (Prenatal vit-fe fumarate-fa) .... One tab by mouth daily 5)  Percocet 5-325 Mg Tabs (Oxycodone-acetaminophen) .... Take 1-2 tabs by mouth every 6 hours as needed for pain 6)  Hydrochlorothiazide 25 Mg Tabs (Hydrochlorothiazide) .... Take 1 pill daily po
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
## 2010-04-14 NOTE — Assessment & Plan Note (Signed)
Summary: Lower back pain, insomnia, and HTN   Vital Signs:  Patient profile:   35 year old female Height:      65 inches Weight:      185.4 pounds BMI:     30.96 Temp:     98.6 degrees F oral Pulse rate:   87 / minute BP sitting:   135 / 81  (left arm) Cuff size:   regular  Vitals Entered By: Gladstone Pih (June 29, 2009 1:23 PM) CC: C/O lower back pain X6days Is Patient Diabetic? No Pain Assessment Patient in pain? yes     Location: lower back   Primary Care Provider:  Marisue Ivan  MD  CC:  C/O lower back pain X6days.  History of Present Illness: 35yo AAF c/o lumbar pain and insomnia  Lumbar pain: x 6 days.  Worsening course.  States that it is localized in lower back, bilaterally.  She woke up with the pain.  Denies any event or trauma to cause the pain.  No prior hx of back problems.  Pain is intermittent sharp pain, nonradiating.  No numbness, weakness, or paresthesia of lower extremities.  No associated fevers, chills, N/V, dysuria, or hematuria.  Insomnia: Chronic issue.  States she has been taking sleeping aides like Tylenol PM since she was a kid.  Denies any stimulants at night.    HTN: No adverse effects from medication.  Not checking it regularly.  No dizziness, HA, CP, palpitations, or swelling.   Habits & Providers  Alcohol-Tobacco-Diet     Tobacco Status: current     Tobacco Counseling: to quit use of tobacco products     Cigarette Packs/Day: 0.5  Current Medications (verified): 1)  Hydrochlorothiazide 25 Mg  Tabs (Hydrochlorothiazide) .... Take 1 Tab By Mouth Every Morning 2)  Naproxen 500 Mg Tabs (Naproxen) .Marland Kitchen.. 1 Tab By Mouth Two Times A Day With Food X 10 Days  Allergies (verified): 1)  ! Imitrex (Sumatriptan Succinate)  Past History:  Past Medical History: Last updated: 12/20/2006 1. HTN 2. Allergic rhinitis 3. Migraines  Social History: Packs/Day:  0.5  Review of Systems      See HPI  Physical Exam  General:  VS Reviewed.  Well appearing, NAD.  Lungs:  Normal respiratory effort, chest expands symmetrically. Lungs are clear to auscultation, no crackles or wheezes. Heart:  Normal rate and regular rhythm. S1 and S2 normal without gallop, murmur, click, rub or other extra sounds. Msk:  Back exam: Inspection- no obvious deformities, no erythema, ecchymosis, or edema Palpation- no ttp of entire spine; mild ttp of para-lumbar muscles ROM- full Neg straight leg raise- sitting and supine Neurologic:  gross sensation intact diminished DTRs but equal b/l 5/5 strenght lower extremities   Impression & Recommendations:  Problem # 1:  BACK PAIN, LUMBAR (ICD-724.2) Assessment New Exam and hx reassuring.  No neuropathy. Likely para-lumbar muscle strain. Will treat with naproxen and rehab back exercises and f/u in 2 weeks.  Her updated medication list for this problem includes:    Naproxen 500 Mg Tabs (Naproxen) .Marland Kitchen... 1 tab by mouth two times a day with food x 10 days  Orders: Urinalysis-[facility] (00000) [facility]- Est  Level 4 (16606)  Problem # 2:  INSOMNIA, CHRONIC (ICD-307.42) Assessment: Unchanged  This is the first time she has complained of insomnia. States it is a chronic issue. Plan to implement a new sleep routine and inc exercise regimen. Will f/u in 2 weeks.  Consider add melatonin at that time.  Orders:  [facility]- Est  Level 4 (99214)  Problem # 3:  HYPERTENSION (ICD-401.9) Assessment: Improved  At goal (<140/90). No changes to regimen.  Her updated medication list for this problem includes:    Hydrochlorothiazide 25 Mg Tabs (Hydrochlorothiazide) .Marland Kitchen... Take 1 tab by mouth every morning  Orders: [facility]- Est  Level 4 (16109)  Complete Medication List: 1)  Hydrochlorothiazide 25 Mg Tabs (Hydrochlorothiazide) .... Take 1 tab by mouth every morning 2)  Naproxen 500 Mg Tabs (Naproxen) .Marland Kitchen.. 1 tab by mouth two times a day with food x 10 days  Patient Instructions: 1)  Please schedule a follow-up appointment in  2 weeks.  2)  Back pain: Do the exercises provided and take the pain medication with food. 3)  Insomnia: Go to bed at the same time each night and wake up at the same time each morning.  Increase the intensity of exercise 7 days a week but before 6pm.   Prescriptions: NAPROXEN 500 MG TABS (NAPROXEN) 1 tab by mouth two times a day with food x 10 days  #20 x 0   Entered and Authorized by:   Marisue Ivan  MD   Signed by:   Marisue Ivan  MD on 06/29/2009   Method used:   Electronically to        [facility] [phone]* (retail)       [address]       [place], [place]  [address]       Ph: [phone]       Fax: [phone]   RxID:   [id number]    Prevention & Chronic Care Immunizations   Influenza vaccine: Not documented    Tetanus booster: Not documented    Pneumococcal vaccine: Not documented  Other Screening   Pap smear: normal  (12/20/2006)   Pap smear due: 12/19/2009   Smoking status: current  (06/29/2009)  Lipids   Total Cholesterol: 190  (12/21/2006)   LDL: 111  (12/21/2006)   LDL Direct: Not documented   HDL: 63  (12/21/2006)   Triglycerides: 80  (12/21/2006)  Hypertension   Last Blood Pressure: 135 / 81  (06/29/2009)   Serum creatinine: 0.77  (10/29/2008)   BMP action: Ordered   Serum potassium 4.0  (10/29/2008)    Hypertension flowsheet reviewed?: Yes   Progress toward BP goal: At goal  Self-Management Support :   Personal Goals (by the next clinic visit) :      Personal blood pressure goal: 140/90  (10/29/2008)   Patient will work on the following items until the next clinic visit to reach self-care goals:     Medications and monitoring: take my medicines every day, check my blood pressure, bring all of my medications to every visit  (06/29/2009)     Eating: drink diet soda or water instead of juice or soda, eat more vegetables, use fresh or frozen vegetables, eat foods that are low in salt, eat baked foods instead of fried foods, eat  fruit for snacks and desserts, limit or avoid alcohol  (06/29/2009)    Hypertension self-management support: BP self-monitoring log, Written self-care plan, Education handout  (06/29/2009)   Hypertension self-care plan printed.   Hypertension education handout printed  Appended Document: Lower back pain, insomnia, and HTN Reviewed.  Normal...........Marland KitchenMarisue Ivan, MD   Clinical Lists Changes  Observations: Added new observation of COMMENTS: Eustaquio Boyden  MD  June 29, 2009 2:14 PM  (06/29/2009 14:13) Added new observation of PH URINE: 5.5  (06/29/2009 14:13) Added  new observation of SPEC GR URIN: >=1.030  (06/29/2009 14:13) Added new observation of APPEARANCE U: Clear  (06/29/2009 14:13) Added new observation of UA COLOR: dark yellow  (06/29/2009 14:13) Added new observation of WBC DIPSTK U: negative  (06/29/2009 14:13) Added new observation of NITRITE URN: negative  (06/29/2009 14:13) Added new observation of UROBILINOGEN: 0.2  (06/29/2009 14:13) Added new observation of PROTEIN, URN: trace  (06/29/2009 14:13) Added new observation of BLOOD UR DIP: negative  (06/29/2009 14:13) Added new observation of KETONES URN: trace (5)  (06/29/2009 14:13) Added new observation of BILIRUBIN UR: negative  (06/29/2009 14:13) Added new observation of GLUCOSE, URN: negative  (06/29/2009 14:13)      Laboratory Results   Urine Tests  Date/Time Received: June 29, 2009 1:55 PM  Date/Time Reported: 2:13 PM   Routine Urinalysis   Color: dark yellow Appearance: Clear Glucose: negative   (Normal Range: Negative) Bilirubin: negative   (Normal Range: Negative) Ketone: trace (5)   (Normal Range: Negative) Spec. Gravity: >=1.030   (Normal Range: 1.003-1.035) Blood: negative   (Normal Range: Negative) pH: 5.5   (Normal Range: 5.0-8.0) Protein: trace   (Normal Range: Negative) Urobilinogen: 0.2   (Normal Range: 0-1) Nitrite: negative   (Normal Range: Negative) Leukocyte Esterace:  negative   (Normal Range: Negative)    Comments: Eustaquio Boyden  MD  June 29, 2009 2:14 PM

## 2010-05-26 LAB — POCT PREGNANCY, URINE: Preg Test, Ur: NEGATIVE

## 2010-06-20 LAB — POCT URINALYSIS DIP (DEVICE)
Bilirubin Urine: NEGATIVE
Glucose, UA: NEGATIVE mg/dL
Hgb urine dipstick: NEGATIVE
Ketones, ur: NEGATIVE mg/dL
Nitrite: NEGATIVE
Protein, ur: NEGATIVE mg/dL
Specific Gravity, Urine: 1.025 (ref 1.005–1.030)
Urobilinogen, UA: 1 mg/dL (ref 0.0–1.0)
pH: 6 (ref 5.0–8.0)

## 2010-06-20 LAB — POCT PREGNANCY, URINE: Preg Test, Ur: NEGATIVE

## 2010-06-20 LAB — URINE CULTURE: Colony Count: 35000

## 2010-07-26 ENCOUNTER — Other Ambulatory Visit: Payer: Self-pay | Admitting: Family Medicine

## 2010-07-26 NOTE — Telephone Encounter (Signed)
Refill request

## 2010-07-29 NOTE — Group Therapy Note (Signed)
Kristin [name], Kristin [name] NO.:  [id number]   MEDICAL RECORD NO.:  [id number]          PATIENT TYPE:  WOC   LOCATION:  [facility]                   FACILITY:  [facility]   PHYSICIAN:  Argentina Donovan, MD        DATE OF BIRTH:  1975/03/30   DATE OF SERVICE:  02/11/2004                                    CLINIC NOTE   REASON FOR VISIT:  The patient is a 35 year old black female with history of  tubal ligation who went into the [facility] because of amenorrhea and breast  tenderness with bilateral breast discharge.  She had a pregnancy test which  was negative and was referred to our clinic, and in the interim had her  period.  We discussed breast discharges with the caution of no stimulation  and not to squeeze as she has done to elicit the discharge when she had sore  breasts after the delayed onset of period.  She was due for Pap smear which  we did, and [facility] could do a prolactin although I expect it is going to be  normal.  The external genitalia is normal, BUS within normal limits.  The  vagina is clean, well-rugated, the cervix clean and parous.  The uterus  anterior; normal size, shape, consistency.  The adnexa is normal.  The  abdomen is soft, flat, nontender, no masses nor organomegaly.   IMPRESSION:  1.  Galactorrhea secondary to manipulation.  2.  Gynecological examination for Pap smear.   In addition, the patient is found to have significant hypertension, 152/100  and 166/114, for which she will be referred to the medical clinic.      PR/MEDQ  D:  02/11/2004  T:  02/11/2004  Job:  [id number]

## 2010-09-06 ENCOUNTER — Ambulatory Visit (INDEPENDENT_AMBULATORY_CARE_PROVIDER_SITE_OTHER): Payer: Medicaid Other | Admitting: Family Medicine

## 2010-09-06 ENCOUNTER — Telehealth: Payer: Self-pay | Admitting: Family Medicine

## 2010-09-06 ENCOUNTER — Encounter: Payer: Self-pay | Admitting: Family Medicine

## 2010-09-06 VITALS — BP 122/80 | HR 88 | Temp 98.4°F | Ht 64.0 in | Wt 183.0 lb

## 2010-09-06 DIAGNOSIS — L989 Disorder of the skin and subcutaneous tissue, unspecified: Secondary | ICD-10-CM

## 2010-09-06 NOTE — Patient Instructions (Signed)
Get on the Internet and read about Chlorhexidine baths or Hibiclens baths for MRSA prevention.  Our office will call you or send a letter with your culture results.  Remember to put sunblock on the dark areas on your legs and stomach.

## 2010-09-06 NOTE — Telephone Encounter (Signed)
Please call this patient in the morning and let her know that our swab could not be sent off since it was two different areas on 1 swab. She needs to come back for a nurse visit (they will not charge her for it) and have one or both of them reswabbed tomorrow if they reaccumulate. Please let her know first thing in the morning.

## 2010-09-07 ENCOUNTER — Ambulatory Visit: Payer: Medicaid Other

## 2010-09-07 ENCOUNTER — Encounter: Payer: Self-pay | Admitting: Sports Medicine

## 2010-09-07 ENCOUNTER — Ambulatory Visit (INDEPENDENT_AMBULATORY_CARE_PROVIDER_SITE_OTHER): Payer: Medicaid Other | Admitting: Sports Medicine

## 2010-09-07 VITALS — BP 117/79 | HR 81 | Temp 97.8°F | Wt 183.7 lb

## 2010-09-07 DIAGNOSIS — L989 Disorder of the skin and subcutaneous tissue, unspecified: Secondary | ICD-10-CM

## 2010-09-07 MED ORDER — DOXYCYCLINE HYCLATE 100 MG PO TABS: 100.0000 mg | ORAL_TABLET | Freq: Two times a day (BID) | ORAL | Status: AC

## 2010-09-07 MED ORDER — PERMETHRIN 5 % EX CREA: TOPICAL_CREAM | Freq: Once | CUTANEOUS | Status: AC

## 2010-09-07 MED ORDER — HYDROXYZINE HCL 25 MG PO TABS: 25.0000 mg | ORAL_TABLET | Freq: Four times a day (QID) | ORAL | Status: AC | PRN

## 2010-09-07 NOTE — Assessment & Plan Note (Signed)
I suspect her rash is due to scabies, it is more pruritic than painful which is less suggestive of a folliculitis.  Though there is minimal erythema, it is still possible there is some bacterial superinfection. Will tx with permethrin applied once. Doxycycline 100mg  PO BID x 7d. Hydroxyzine for itching.

## 2010-09-07 NOTE — Progress Notes (Signed)
  Subjective:    Patient ID: Kristin [name], female    DOB: Dec 18, 1975, 35 y.o.   MRN: [id number]  HPI Multiple pruritic spots on wrists, arms, waist, ankles.  They are small red spots that are intensely pruritic, they then scab over and become hyperpigmented.  No fevers/chills.  Noone else in house has similar symptoms.   Review of Systems See HPI    Objective:   Physical Exam  Constitutional: She appears well-developed and well-nourished. No distress.  Skin: Skin is warm and dry.       Pt has multiple 1mm papules with red central punctum, excoriated, on wrists/forearms, waist, ankles, legs.          Assessment & Plan:

## 2010-09-07 NOTE — Patient Instructions (Signed)
Great to see you. Permethrin as directed. Doxy as directed. Hydroxyzine for itching. Come back if no better after 2nd application.   Ihor Austin. Benjamin Stain, M.D. [name] [address] [place] [address] [phone]  . Scabies Scabies are small bugs (mites) that burrow under the skin and cause red bumps and severe itching. These bugs can only be seen with a microscope.   Scabies are highly contagious. They can spread easily from person to person by direct contact. They are also spread through sharing clothing or linens that have the scabies mites living in them. It is not unusual for an entire family to become infected through shared towels, clothing, or bedding.   HOME CARE INSTRUCTIONS  Your caregiver may prescribe a cream or lotion to kill the mites. If this cream is prescribed; massage the cream into the entire area of the body from the neck to the bottom of both feet. Also massage the cream into the scalp and face if your child is less than 20 year old. Avoid the eyes and mouth.   Leave the cream on for 8 to12 hours. DO NOT wash your hands after application. Your child should bathe or shower after the 8 to 12 hour application period. Sometimes it is helpful to apply the cream to your child at right before bedtime.   One treatment is usually effective and will eliminate approximately 95% of infestations. For severe cases, your caregiver may decide to repeat the treatment in 1 week. Everyone in your household should be treated with one application of the cream.   New rashes or burrows should not appear after successful treatment within 24 to 48 hours; however the itching and rash may last for 2 to 4 weeks after successful treatment. If your symptoms persist longer than this, see your caregiver.   Your caregiver also may prescribe a medication to help with the itching or to help the rash go away more quickly.   Scabies can live on clothing or linens for  up to 3 days. Your entire child's recently used clothing, towels, stuffed toys, and bed linens should be washed in hot water and then dried in a dryer for at least 20 minutes on high heat. Items that cannot be washed should be enclosed in a plastic bag for at least 3 days.   To help relieve itching, bathe your child in a COOL bath or apply cool washcloths to the affected areas.   Your child may return to school after treatment with the prescribed cream.  SEEK MEDICAL CARE IF:  The itching persists longer than 4 weeks after treatment.   The rash spreads or becomes infected (the area has red blisters or yellow-tan crust).  Document Released: 02/27/2005 Document Re-Released: 07/16/2008 [facility] Patient Information 2011 [place], [place].IMPORTANT: HOW TO USE THIS INFORMATION:  This is a summary and does NOT have all possible information about this product. This information does not assure that this product is safe, effective, or appropriate for you. This information is not individual medical advice and does not substitute for the advice of your health care professional. Always ask your health care professional for complete information about this product and your specific health needs.     PERMETHRIN CREAM - TOPICAL (purr-METH-rin)    COMMON BRAND NAME(S): Elimite    USES:  This medication is used to treat scabies, a condition caused by tiny insects called mites that infest and irritate your skin. Permethrin belongs to a class of drugs  known as pyrethrins. Permethrin works by paralyzing and killing the mites and their eggs.    HOW TO USE:  This medication is for use on the skin only. Apply this medication as soon as possible after it is prescribed. Apply the medicine from your head to the soles of your feet, including under your nails and in skin folds such as between the toes, as directed. Massage the cream into the skin. Do not use more medication than prescribed. Wash off the cream after 8-14 hours  by showering or taking a bath. Avoid getting the cream into your eyes, nose, mouth, or vagina. If the medication gets in your eyes, flush the eyes with plenty of water. Consult your doctor if irritation persists. Symptoms of scabies include an intense itching that is usually worse at bedtime. You may also see small, fine, wavy lines on the skin with a tiny insect at the end (a burrow). Ronna Polio are usually found on finger/toe webs, wrists, elbows, armpits, belt line, lower buttocks, female nipples, or female genitals. Even if permethrin kills all the scabies, the dead mites can still make you itch for up to 4 weeks after treatment. Ask your doctor about other medications that may be used to soothe the itching. Inform your doctor if your condition persists or worsens 2 weeks after treatment. Your doctor may need to look for living mites and recommend more treatment.    SIDE EFFECTS:  Skin irritation, including itching, swelling, and redness, may occur with scabies and temporarily worsen after treatment with permethrin. Mild burning or stinging may also occur. If any of these effects persist or worsen, notify your doctor or pharmacist promptly. Remember that your doctor has prescribed this medication because he or she has judged that the benefit to you is greater than the risk of side effects. Many people using this medication do not have serious side effects. A very serious allergic reaction to this drug is rare. However, seek immediate medical attention if you notice any symptoms of a serious allergic reaction, including: rash, itching/swelling (especially of the face/tongue/throat), severe dizziness, trouble breathing. This is not a complete list of possible side effects. If you notice other effects not listed above, contact your doctor or pharmacist. In the [place] - Call your doctor for medical advice about side effects. You may report side effects to FDA at 1-800-FDA-1088. In [place] - Call your doctor for medical  advice about side effects. You may report side effects to [facility] [place] at [phone].    PRECAUTIONS:  Before using permethrin, tell your doctor or pharmacist if you are allergic to it; or if you have any other allergies. This product may contain inactive ingredients, which can cause allergic reactions or other problems. Talk to your pharmacist for more details. Before using this medication, tell your doctor or pharmacist your medical history, especially of: skin infections, asthma. Constant or forceful scratching of the skin/scalp may lead to a bacterial skin infection. Tell your doctor immediately if you develop worsening redness or pus. Tell your doctor if you are pregnant before using this medication. It is unknown if this drug passes into breast milk but is unlikely to harm a nursing infant. Consult your doctor before breast-feeding.    DRUG INTERACTIONS:  Your doctor or pharmacist may already be aware of any possible drug interactions and may be monitoring you for them. Do not start, stop, or change the dosage of any medicine before checking with your doctor or pharmacist first. Before using this medication, tell  your doctor or pharmacist of all prescription and nonprescription/herbal products you may use. This document does not contain all possible interactions. Therefore, before using this product, tell your doctor or pharmacist of all the products you use. Keep a list of all your medications with you, and share the list with your doctor and pharmacist.    OVERDOSE:  This medication may be harmful if swallowed. If swallowing or overdose is suspected, contact your local poison control center or emergency room immediately. [place] residents can call the [place] National Poison Hotline at 340-883-1304. [place] residents should call a provincial local poison control center directly.    NOTES:  Do not share this medication with others. One application is usually all that is needed. To avoid giving scabies  to another person or getting it again, clothing and bed linens that have been in contact with your skin less than 2 days before treatment should be machine-washed with hot water and dried in a hot dryer for 20 minutes, dry cleaned, or removed from body contact for 72 hours. People who are in close contact with the infected person, such as members of the same household, should also be checked for scabies.    MISSED DOSE:  Not applicable.    STORAGE:  Store at room temperature between 59-77 degrees F (15-25 degrees C) away from light and moisture. Do not store in the bathroom. Keep all medicines away from children and pets. Do not flush medications down the toilet or pour them into a drain unless instructed to do so. Properly discard this product when it is expired or no longer needed. Consult your pharmacist or local waste disposal company for more details about how to safely discard your product.    Information last revised October 2010 Copyright(c) 2010 [employer].

## 2010-09-08 NOTE — Progress Notes (Signed)
Skin Lesion: PT has had some skin lesions that last 3 weeks. She has some itching with them but says they comes and go. Her current lesions on her on left chin and Rt index finger. However, she has had them on her abdomen and legs and she showed me the scars. She as not been able to come in because of her work and wants to see if there is something that can be done about them today. No h/o of exposure to a person with a rash.   ROS: neg except as noted above.   PE:  Skin: Pt has what looks like an acne bump on her left chin measuring 2 mm is size, the also has a Rt index finger clear fluid filled blister-looking lesion that is about 5 mm in size. Multiple scars from prior lesion on her abdomen and legs.

## 2010-09-08 NOTE — Assessment & Plan Note (Signed)
Pt has noticed these random "bumps" that appear to be small abscesses on her abdomen and now there is one on her face and a clear fluid filled lesion on her Rt index finger.  I suspect the one on her face is an acne bump but the one on her finger is not the same. Will try to culture.

## 2010-09-09 ENCOUNTER — Ambulatory Visit: Payer: Medicaid Other

## 2010-09-14 ENCOUNTER — Emergency Department (HOSPITAL_COMMUNITY)
Admission: EM | Admit: 2010-09-14 | Discharge: 2010-09-14 | Disposition: A | Discharge status: Home / Self Care | Payer: Medicaid Other | Attending: Emergency Medicine | Admitting: Emergency Medicine

## 2010-09-14 ENCOUNTER — Emergency Department (HOSPITAL_COMMUNITY): Payer: Medicaid Other

## 2010-09-14 DIAGNOSIS — S63279A Dislocation of unspecified interphalangeal joint of unspecified finger, initial encounter: Secondary | ICD-10-CM | POA: Insufficient documentation

## 2010-09-14 DIAGNOSIS — M79609 Pain in unspecified limb: Secondary | ICD-10-CM | POA: Insufficient documentation

## 2010-09-14 DIAGNOSIS — M20009 Unspecified deformity of unspecified finger(s): Secondary | ICD-10-CM | POA: Insufficient documentation

## 2010-09-14 DIAGNOSIS — I10 Essential (primary) hypertension: Secondary | ICD-10-CM | POA: Insufficient documentation

## 2010-09-14 DIAGNOSIS — S6980XA Other specified injuries of unspecified wrist, hand and finger(s), initial encounter: Secondary | ICD-10-CM | POA: Insufficient documentation

## 2010-09-14 DIAGNOSIS — S6990XA Unspecified injury of unspecified wrist, hand and finger(s), initial encounter: Secondary | ICD-10-CM | POA: Insufficient documentation

## 2010-10-04 ENCOUNTER — Ambulatory Visit (INDEPENDENT_AMBULATORY_CARE_PROVIDER_SITE_OTHER): Payer: Medicaid Other | Admitting: Family Medicine

## 2010-10-04 ENCOUNTER — Encounter: Payer: Self-pay | Admitting: Family Medicine

## 2010-10-04 DIAGNOSIS — F410 Panic disorder [episodic paroxysmal anxiety] without agoraphobia: Secondary | ICD-10-CM

## 2010-10-04 DIAGNOSIS — F3189 Other bipolar disorder: Secondary | ICD-10-CM

## 2010-10-04 DIAGNOSIS — F3181 Bipolar II disorder: Secondary | ICD-10-CM

## 2010-10-04 DIAGNOSIS — G4700 Insomnia, unspecified: Secondary | ICD-10-CM

## 2010-10-04 MED ORDER — ESZOPICLONE 1 MG PO TABS: 1.0000 mg | ORAL_TABLET | Freq: Every day | ORAL | Status: DC

## 2010-10-04 MED ORDER — CLONAZEPAM 0.5 MG PO TABS: 0.5000 mg | ORAL_TABLET | Freq: Two times a day (BID) | ORAL | Status: DC

## 2010-10-04 NOTE — Patient Instructions (Signed)
Take the Klonopin 1 pill in the AM and 1 pill in the PM for 1 week, then if you need it. Take the Lunesta at night.  The Klonopin can also be used for sleep, so if you take then both do not drive afterwards. I will [facility] to [facility]

## 2010-10-05 ENCOUNTER — Telehealth: Payer: Self-pay | Admitting: *Deleted

## 2010-10-05 ENCOUNTER — Telehealth: Payer: Self-pay | Admitting: Psychology

## 2010-10-05 ENCOUNTER — Other Ambulatory Visit: Payer: Self-pay | Admitting: Family Medicine

## 2010-10-05 MED ORDER — ZOLPIDEM TARTRATE 5 MG PO TABS: 5.0000 mg | ORAL_TABLET | Freq: Every evening | ORAL | Status: DC | PRN

## 2010-10-05 NOTE — Telephone Encounter (Signed)
Patient called to schedule [facility] appointment.  First available for a new patient was 11/16/2010 at 9:30.  Discussed options including [facility]  Patient preferred to wait.  Will alert Dr. Gwendolyn Grant in case he needs to follow-up with her sooner.    I explained that I need a phone call if she is unable to attend her appointment.  She voiced an understanding.

## 2010-10-05 NOTE — Telephone Encounter (Signed)
Change to Ambien

## 2010-10-05 NOTE — Telephone Encounter (Signed)
Will switch to Ambien.  Same warnings apply for sedation and not to take with klonopin

## 2010-10-05 NOTE — Telephone Encounter (Signed)
PA required for Lunesta. Form placed in MD box.

## 2010-10-05 NOTE — Telephone Encounter (Signed)
Patient calls asking about status of medication. She states pharmacy told her Ambien would be covered. Will forward message to MD to ask if he wants to switch medication.

## 2010-10-05 NOTE — Telephone Encounter (Signed)
Rx called to pharmacy for generic ambein. Advised pharmacy to cancel RX for Lunesta.

## 2010-10-09 ENCOUNTER — Encounter: Payer: Self-pay | Admitting: Family Medicine

## 2010-10-09 DIAGNOSIS — F410 Panic disorder [episodic paroxysmal anxiety] without agoraphobia: Secondary | ICD-10-CM | POA: Insufficient documentation

## 2010-10-09 DIAGNOSIS — F319 Bipolar disorder, unspecified: Secondary | ICD-10-CM | POA: Insufficient documentation

## 2010-10-09 NOTE — Assessment & Plan Note (Signed)
Likely diagnosis, but await final diagnosis by [facility]. Referred her there today.  She would likely benefit from mood stabilizer.  If unable to be seen, can start this and ask for [facility] for further guidance.

## 2010-10-09 NOTE — Assessment & Plan Note (Signed)
Will try short-term course of Ambien.  Remeron may help this patient, along with depression, see Bipolar.

## 2010-10-09 NOTE — Progress Notes (Signed)
  Subjective:    Patient ID: Kristin [name], female    DOB: March 01, 1976, 35 y.o.   MRN: [id number]  HPI 1.  Concern for bipolar:  She has been told by family members to discuss with her doctor about being bipolar.  Doesn't know what this means.  Describes periods of anhedonia, depression, guilt, loss of interest, followed by weeks of elevated mood, increased sexual activity, spending sprees.  States "I spend lots of money when I'm angry."  Also states other family members think angers very easily, very labile daily mood.  No hallucinations, visual disturbances.  2.  Anxiety attacks:  Becoming more frequent for patient.  Describes chest tightness, difficulty breathing, feelings of doom, palpitations.  Lasts about 5 minutes then resolves over time.  No related to exertion.  Triggered by stress or leaving house.  No LE swelling.    3.  Insomnia:  Long-standing problem for patient, ever since she was a teenager.  Difficulty falling asleep and staying asleep.  Lunesta helped with this in past, has not taken for some time.  Now taking OTC meds, but states these are no longer helping.    Review of Systems See HPI above for review of systems.       Objective:   Physical Exam Gen:  Alert, cooperative patient who appears stated age in no acute distress.  Vital signs reviewed. Neck: No masses or thyromegaly or limitation in range of motion.  No cervical lymphadenopathy. Cardiac:  Regular rate and rhythm without murmur auscultated.  Good S1/S2. Pulm:  Clear to auscultation bilaterally with good air movement.  No wheezes or rales noted.   Ext:  No clubbing/cyanosis/erythema.  No edema noted bilateral lower extremities.   Alert and oriented to person, place, and date.  CN II-XII intact.  No focal deficits noted.          Assessment & Plan:   No problem-specific assessment & plan notes found for this encounter.

## 2010-10-09 NOTE — Assessment & Plan Note (Signed)
Started on Klonopin.  Will taper off once she is started on mood stabilizer.  Do not want to start SSRI today for fear of triggering manic episode.

## 2010-10-21 ENCOUNTER — Telehealth: Payer: Self-pay | Admitting: Psychology

## 2010-10-21 NOTE — Telephone Encounter (Signed)
Called [place] to let her know I had a cancellation for [facility] for August 15th.  She is scheduled for September 5th.  She said she was able to make that appointment.  Scheduled for 10:00.  Reminded her that I need a phone call if for some reason she is unable to attend.

## 2010-10-26 ENCOUNTER — Ambulatory Visit (INDEPENDENT_AMBULATORY_CARE_PROVIDER_SITE_OTHER): Payer: Medicaid Other | Admitting: Psychology

## 2010-10-26 DIAGNOSIS — F1010 Alcohol abuse, uncomplicated: Secondary | ICD-10-CM

## 2010-10-26 DIAGNOSIS — F319 Bipolar disorder, unspecified: Secondary | ICD-10-CM

## 2010-10-26 MED ORDER — DIVALPROEX SODIUM ER 500 MG PO TB24: 1000.0000 mg | ORAL_TABLET | Freq: Every day | ORAL | Status: DC
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
## 2010-10-26 NOTE — Patient Instructions (Signed)
Please call the [facility] ([facility] at:  [phone].  Dr. Kathrynn Running and I think you need this level of care in order to adequately treat your illness. You can educate yourself at [email or web address]  There you will find a lot of information about Bipolar Disorder. Staying away from alcohol is your best chance at being well.  We suspect you will not be able to manage your illness well at your current level of drinking. Dr. Kathrynn Running recommended Depakote as a good medication to treat your illness.  It is a mood stabilizer.  You will likely need more than one medication for adequate treatment.  Take one pill a day for three days and then two pills daily.  Always with food (evening meal is a good choice).  You may experience nausea and upset stomach - especially if you don't take with food.  This medicine should not make you feel worse.  If you are feeling worse, please call Dr. Pascal Lux at [phone] or follow-up with Dr. Gwendolyn Grant at [facility]. Please schedule a follow-up with Dr. Gwendolyn Grant in two weeks to get your Depakote level drawn.   Getting at therapist is another crucial piece of your treatment.  We recommend Letha Cape for therapy at [phone].

## 2010-10-26 NOTE — Assessment & Plan Note (Signed)
Denies difficulty with the law although violent episodes appear to be more likely when drinking.  Does drink and drive.  Did not assess history of drinking.  + family history of alcoholism (mother).
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
## 2010-10-28 NOTE — Progress Notes (Signed)
I have read this note and spoken in person with Dr. Pascal Lux.  Kristin [name] obviously is in need of much help and outside the scope of primary care regarding her Behavioral and mental health.  Await seeing her again, will draw Depakote level, and continue to support her in whatever capacity I can.

## 2010-10-31 ENCOUNTER — Emergency Department (HOSPITAL_COMMUNITY)
Admission: EM | Admit: 2010-10-31 | Discharge: 2010-10-31 | Disposition: A | Discharge status: Home / Self Care | Payer: Medicaid Other | Attending: Emergency Medicine | Admitting: Emergency Medicine

## 2010-10-31 ENCOUNTER — Emergency Department (HOSPITAL_COMMUNITY): Payer: Medicaid Other

## 2010-10-31 DIAGNOSIS — W208XXA Other cause of strike by thrown, projected or falling object, initial encounter: Secondary | ICD-10-CM | POA: Insufficient documentation

## 2010-10-31 DIAGNOSIS — R21 Rash and other nonspecific skin eruption: Secondary | ICD-10-CM | POA: Insufficient documentation

## 2010-10-31 DIAGNOSIS — I10 Essential (primary) hypertension: Secondary | ICD-10-CM | POA: Insufficient documentation

## 2010-10-31 DIAGNOSIS — M79609 Pain in unspecified limb: Secondary | ICD-10-CM | POA: Insufficient documentation

## 2010-11-01 ENCOUNTER — Telehealth: Payer: Self-pay | Admitting: Psychology

## 2010-11-01 NOTE — Telephone Encounter (Signed)
Called [place] to follow-up from [facility].  I forgot to take her to the lab after her [facility] appointment and she was under the impression she would get them drawn when she followed up with Dr. Gwendolyn Grant.  She said she had an appt scheduled for this Wednesday with Dr. Gwendolyn Grant but I do not see it listed.  Called her back and encouraged her to schedule with Dr. Gwendolyn Grant and get her labs.  She reported she has started taking Depakote and is tolerating it.  Will notify Dr. Gwendolyn Grant of mix-up with the lab.

## 2010-11-09 ENCOUNTER — Encounter: Payer: Self-pay | Admitting: Family Medicine

## 2010-11-09 ENCOUNTER — Other Ambulatory Visit: Payer: Medicaid Other

## 2010-11-09 ENCOUNTER — Ambulatory Visit (INDEPENDENT_AMBULATORY_CARE_PROVIDER_SITE_OTHER): Payer: Medicaid Other | Admitting: Family Medicine

## 2010-11-09 VITALS — BP 139/94 | HR 70 | Temp 98.2°F | Ht 64.5 in | Wt 187.4 lb

## 2010-11-09 DIAGNOSIS — F1010 Alcohol abuse, uncomplicated: Secondary | ICD-10-CM

## 2010-11-09 DIAGNOSIS — I10 Essential (primary) hypertension: Secondary | ICD-10-CM

## 2010-11-09 DIAGNOSIS — F319 Bipolar disorder, unspecified: Secondary | ICD-10-CM

## 2010-11-09 LAB — COMPREHENSIVE METABOLIC PANEL
Albumin: 4.3 g/dL (ref 3.5–5.2)
CO2: 27 mEq/L (ref 19–32)
Calcium: 9.3 mg/dL (ref 8.4–10.5)
Glucose, Bld: 74 mg/dL (ref 70–99)
Potassium: 3.8 mEq/L (ref 3.5–5.3)
Sodium: 138 mEq/L (ref 135–145)
Total Protein: 6.9 g/dL (ref 6.0–8.3)

## 2010-11-09 LAB — LIPID PANEL
Cholesterol: 215 mg/dL — ABNORMAL HIGH (ref 0–200)
LDL Cholesterol: 80 mg/dL (ref 0–99)
VLDL: 52 mg/dL — ABNORMAL HIGH (ref 0–40)

## 2010-11-09 LAB — CBC
Hemoglobin: 13.1 g/dL (ref 12.0–15.0)
RBC: 4.49 MIL/uL (ref 3.87–5.11)

## 2010-11-09 MED ORDER — CLONAZEPAM 0.5 MG PO TABS: 0.5000 mg | ORAL_TABLET | Freq: Two times a day (BID) | ORAL | Status: DC

## 2010-11-09 MED ORDER — ZOLPIDEM TARTRATE 5 MG PO TABS: 5.0000 mg | ORAL_TABLET | Freq: Every evening | ORAL | Status: DC | PRN

## 2010-11-09 NOTE — Progress Notes (Signed)
TSH,FLP,CBC AND CMP DONE TODAY Kristin [name]

## 2010-11-09 NOTE — Patient Instructions (Signed)
Please call the [facility] at:  [phone].  Dr. Manning and I think you need this level of care in order to adequately treat your illness. You can educate yourself at [email or web address].  There you will find a lot of information about Bipolar Disorder. Staying away from alcohol is your best chance at being well.  We suspect you will not be able to manage your illness well at your current level of drinking. Dr. Manning recommended Depakote as a good medication to treat your illness.  It is a mood stabilizer.  You will likely need more than one medication for adequate treatment.  Take one pill a day for three days and then two pills daily.  Always with food (evening meal is a good choice).  You may experience nausea and upset stomach - especially if you don't take with food.  This medicine should not make you feel worse.  If you are feeling worse, please call Dr. Kane at [phone] or [facility]. Please schedule a follow-up with Dr. Walden in two weeks to get your Depakote level drawn.   Getting at therapist is another crucial piece of your treatment.  We recommend Merrianne Leff for therapy at [phone].     

## 2010-11-10 ENCOUNTER — Other Ambulatory Visit: Payer: Medicaid Other

## 2010-11-10 LAB — VALPROIC ACID LEVEL: Valproic Acid Lvl: 125.8 ug/mL — ABNORMAL HIGH (ref 50.0–100.0)

## 2010-11-13 NOTE — Assessment & Plan Note (Signed)
Persistent use.  Counseled regarding cutting back and drinking and driving.

## 2010-11-13 NOTE — Assessment & Plan Note (Signed)
Mood improved today, although patient cannot tell much of a difference with medication.  Reassured her that it may take some time before she notices major changes.  Still not immediate risk to self or others. Depakote level somewhat elevated to 125.  Discussed this with Dr. Raymondo Band, no changes as this will likely help manic symptoms. We will not be continuing this medication here.  She is to obtain refills from psychiatry.  Gave her number again for [facility].   Will continue to follow closely.

## 2010-11-13 NOTE — Progress Notes (Signed)
  Subjective:    Patient ID: Kristin [name], female    DOB: May 17, 1975, 35 y.o.   MRN: [id number]  HPI 1.  [facility]:  Patient states she is doing much better.  Still "actively investigating" Psych, has not yet called [facility].  No real changes in mental status since she was last at [facility].  Still with occasional auditory hallucinations.  Denies any active suicidal or homicidal ideations.  Denies any depression today.  Came for Depakote level today as well.  No abdominal pain, nausea/vomiting, headaches, manic episodes.   Review of Systems See HPI above for review of systems.       Objective:   Physical Exam Gen:  Alert, cooperative patient who appears stated age in no acute distress.  Vital signs reviewed. Cardiac:  Regular rate and rhythm without murmur auscultated.  Good S1/S2. Pulm:  Clear to auscultation bilaterally with good air movement.  No wheezes or rales noted.   Abd:  Soft/nondistended/nontender.  Good bowel sounds throughout all four quadrants.  No masses noted.  Psych: Engaged, not depressed, anxious, or manic appearing today        Assessment & Plan:

## 2010-12-16 ENCOUNTER — Ambulatory Visit (INDEPENDENT_AMBULATORY_CARE_PROVIDER_SITE_OTHER): Payer: Medicaid Other | Admitting: Family Medicine

## 2010-12-16 ENCOUNTER — Encounter: Payer: Self-pay | Admitting: Family Medicine

## 2010-12-16 VITALS — BP 130/88 | HR 71 | Temp 98.6°F | Ht 64.5 in | Wt 196.3 lb

## 2010-12-16 DIAGNOSIS — H6690 Otitis media, unspecified, unspecified ear: Secondary | ICD-10-CM

## 2010-12-16 MED ORDER — AMOXICILLIN 500 MG PO CAPS: 500.0000 mg | ORAL_CAPSULE | Freq: Two times a day (BID) | ORAL | Status: AC

## 2010-12-16 NOTE — Patient Instructions (Signed)
Consider getting your flu shot Make an appointment for smoking cessation counseling at any time! Common Cold, Adult An upper respiratory tract infection, or cold, is a viral infection of the air passages to the lung. Colds are contagious, especially during the first 3 or 4 days. Antibiotics cannot cure a cold. Cold germs are spread by coughs, sneezes, and hand to hand contact. A respiratory tract infection usually clears up in a few days, but some people may be sick for a week or two. HOME CARE INSTRUCTIONS  Only take over-the-counter or prescription medicines for pain, discomfort, or fever as directed by your caregiver.   Be careful not to blow your nose too hard. This may cause a nosebleed.   Use a cool-mist humidifier (vaporizer) to increase air moisture. This will make it easier for you to breath. Do not use hot steam.   Rest as much as possible and get plenty of sleep.   Wash your hands often, especially after you blow your nose. Cover your mouth and nose with a tissue when you sneeze or cough.   Drink at least 8 glasses of clear liquids every day, such as water, fruit juices, tea, clear soups, and carbonated beverages.  SEEK MEDICAL CARE IF:  An oral temperature above 101 lasts 4 days or more, and is not controlled by medication.   You have a sore throat that gets worse or you see white or yellow spots in your throat.   Your cough gets worse or lasts more than 10 days.   You have a rash somewhere on your skin. You have large and tender lumps in your neck.   You have an earache or a headache.   You have thick, greenish or yellowish discharge from your nose.   You cough-up thick yellow, green, gray or bloody mucus (secretions).  SEEK IMMEDIATE MEDICAL CARE IF: You have trouble breathing, chest pain, or your skin or nails look gray or blue. MAKE SURE YOU:    Understand these instructions.   Will watch your condition.   Will get help right away if you are not doing well or  get worse.  Document Released: 02/25/2000 Document Re-Released: 02/10/2008 [facility] Patient Information 2011 [place], [place].

## 2010-12-16 NOTE — Assessment & Plan Note (Signed)
Patient has right otitis media in setting of uri- given mild symptoms, advised would likely self resolve.  Given handwritten rx of amoxicillin to use if worsens.  Discussed important of smoking cessation and encouraged her on already cutting back  Informed her of resources available for additional help.

## 2010-12-16 NOTE — Progress Notes (Signed)
  Subjective:    Patient ID: Kristin [name], female    DOB: Feb 13, 1976, 35 y.o.   MRN: [id number]  HPI 2 weeks of cough., sore throat, nasal congestion and rhinorrhea bilaterally.  Subjective fevers, right ear pressure.  No diarrhea abdominal pain, constipation.  Overall feels fatigued, myalgias.    Review of Systems See above.    Objective:   Physical Exam  GEN: Alert & Oriented, No acute distress HEENT: [place]/AT. EOMI, PERRLA, no conjunctival injection or scleral icterus.  Left tympanic membranes intact without erythema or effusion.  Right TM red without effusion.  Nares without edema or rhinorrhea.  Oropharynx is without erythema or exudates.  No anterior or posterior cervical lymphadenopathy. CV:  Regular Rate & Rhythm, no murmur Respiratory:  Normal work of breathing, CTAB Abd:  + BS, soft, no tenderness to palpation Ext: no pre-tibial edema       Assessment & Plan:

## 2010-12-30 ENCOUNTER — Other Ambulatory Visit: Payer: Self-pay | Admitting: Family Medicine

## 2010-12-30 DIAGNOSIS — F319 Bipolar disorder, unspecified: Secondary | ICD-10-CM

## 2010-12-30 NOTE — Telephone Encounter (Signed)
Kristin [name] is out of her medication for Bi-Polar, she did not know the name of the med.  She uses [facility] on [address].  She is also calling the Pharmacy to request the refill request be sent.

## 2011-01-02 NOTE — Telephone Encounter (Signed)
Forwarded to pcp.Kristin [name]  

## 2011-01-03 ENCOUNTER — Other Ambulatory Visit: Payer: Self-pay | Admitting: Family Medicine

## 2011-01-03 DIAGNOSIS — F319 Bipolar disorder, unspecified: Secondary | ICD-10-CM

## 2011-01-03 MED ORDER — DIVALPROEX SODIUM ER 500 MG PO TB24: 1000.0000 mg | ORAL_TABLET | Freq: Every day | ORAL | Status: DC

## 2011-01-03 NOTE — Telephone Encounter (Signed)
Will provide 1 month refill until Jaelen can be seen at psych office.

## 2011-01-25 ENCOUNTER — Telehealth: Payer: Self-pay | Admitting: Family Medicine

## 2011-01-25 NOTE — Telephone Encounter (Signed)
Patient was here at the end of Aug and had blood work done, she had forgotten to take her meds that day and says her levels may have been elevated because of that, wants to know if she can have the labs redrawn?

## 2011-01-26 NOTE — Telephone Encounter (Signed)
Called pt and told her that she will need to make an appt to be seen for the labs that she is requesting to have redrawn. She stated that she will speak to the mental health doctor. I informed her that she has an appt on 11/30 with pcp and she can possibly speak with him at that time about it.Loralee Pacas [place]

## 2011-02-06 ENCOUNTER — Other Ambulatory Visit: Payer: Self-pay | Admitting: Family Medicine

## 2011-02-06 NOTE — Telephone Encounter (Signed)
Refill request

## 2011-02-10 ENCOUNTER — Ambulatory Visit: Payer: Medicaid Other | Admitting: Family Medicine

## 2011-03-23 ENCOUNTER — Other Ambulatory Visit: Payer: Self-pay | Admitting: Family Medicine

## 2011-03-23 NOTE — Telephone Encounter (Signed)
Refill request

## 2011-04-12 ENCOUNTER — Other Ambulatory Visit: Payer: Self-pay | Admitting: Family Medicine

## 2011-05-26 ENCOUNTER — Other Ambulatory Visit: Payer: Self-pay | Admitting: Family Medicine

## 2011-05-26 NOTE — Telephone Encounter (Signed)
Refill request

## 2011-07-27 ENCOUNTER — Telehealth: Payer: Self-pay | Admitting: Family Medicine

## 2011-07-27 NOTE — Telephone Encounter (Signed)
Patient scheduled in [place] with Dr. Rivka Safer tomorrow morning.

## 2011-07-27 NOTE — Telephone Encounter (Signed)
Patient is calling because she needs a refill on her Klonopin and Ambien.  Her Medicaid has ended so she can't afford to come in and be seen for her meds.  She said her son is very sick and she needs something for her nerves sent to [facility] on [address].

## 2011-07-28 ENCOUNTER — Ambulatory Visit: Payer: Medicaid Other

## 2011-10-18 ENCOUNTER — Ambulatory Visit: Payer: Medicaid Other | Admitting: Sports Medicine

## 2011-12-05 ENCOUNTER — Other Ambulatory Visit: Payer: Self-pay | Admitting: Family Medicine

## 2012-03-26 ENCOUNTER — Ambulatory Visit (INDEPENDENT_AMBULATORY_CARE_PROVIDER_SITE_OTHER): Payer: Medicaid Other | Admitting: Sports Medicine

## 2012-03-26 ENCOUNTER — Encounter: Payer: Self-pay | Admitting: Sports Medicine

## 2012-03-26 VITALS — BP 161/96 | HR 79 | Temp 99.4°F | Wt 195.5 lb

## 2012-03-26 DIAGNOSIS — L259 Unspecified contact dermatitis, unspecified cause: Secondary | ICD-10-CM

## 2012-03-26 DIAGNOSIS — L309 Dermatitis, unspecified: Secondary | ICD-10-CM

## 2012-03-26 MED ORDER — TRIAMCINOLONE ACETONIDE 0.1 % EX OINT: TOPICAL_OINTMENT | Freq: Two times a day (BID) | CUTANEOUS | Status: DC

## 2012-03-26 NOTE — Patient Instructions (Addendum)
It was nice to see you today.   Today we discussed: 1. Eczema  Please switch back to your old detergent or use [employer] hypoallegenic detergent.  Continue to use DOVE SOAP  Use Eucerin Cream or Vaseline Ointment 3-4 times per day to help moisturize your skin  You may use the - triamcinolone ointment (KENALOG) 0.1 %; Apply topically 2 (two) times daily.  Dispense: 30 g; Refill: 0 as needed  Please plan to return to see me in 1 month.  If you need anything prior to seeing me please call the clinic.  Please Bring all medications with you to each appointment.

## 2012-03-26 NOTE — Assessment & Plan Note (Addendum)
Symptomatic treatment Short course of steroid ointment SEE AVS

## 2012-03-28 NOTE — Progress Notes (Signed)
  [name]  Patient name: Wandra Babin MRN [id number]  Date of birth: Apr 03, 1975  CC & HPI:  Arva Slaugh is a 37 y.o. female presenting today for rash on forearms, thighs and legs.  Reports changing laundry detergent recently.  Has not been using any spec fic creams.  Worsened over past 2-3 weeks with weather change and change in detergents at same time period  ROS:  No fevers, no chills, no broken skin  Pertinent History Reviewed:  Medical & Surgical Hx:  Reviewed: Significant for HTN, bipolar disorder, panic attacks and etOH abuse Medications: Reviewed & Updated - see associated section Social History: Reviewed -  reports that she has been smoking.  She does not have any smokeless tobacco history on file.   Objective Findings:  Vitals:  Filed Vitals:   03/26/12 1436  BP: 161/96  Pulse: 79  Temp: 99.4 F (37.4 C)    PE: GENERAL:  Adult obese AA female. In no discomfort; no respiratory distress. Skin: markedly dry skin with eczematous rash on noted on antecubital area with hyperpigmentation.    Assessment & Plan:

## 2012-04-10 ENCOUNTER — Ambulatory Visit: Payer: Medicaid Other | Admitting: Sports Medicine

## 2012-06-12 ENCOUNTER — Ambulatory Visit: Payer: Medicaid Other | Admitting: Sports Medicine

## 2012-06-12 ENCOUNTER — Ambulatory Visit (INDEPENDENT_AMBULATORY_CARE_PROVIDER_SITE_OTHER): Payer: Medicaid Other | Admitting: Sports Medicine

## 2012-06-12 ENCOUNTER — Encounter: Payer: Self-pay | Admitting: Sports Medicine

## 2012-06-12 VITALS — BP 152/99 | HR 75 | Temp 98.3°F | Wt 193.0 lb

## 2012-06-12 DIAGNOSIS — F1010 Alcohol abuse, uncomplicated: Secondary | ICD-10-CM

## 2012-06-12 DIAGNOSIS — R109 Unspecified abdominal pain: Secondary | ICD-10-CM | POA: Insufficient documentation

## 2012-06-12 DIAGNOSIS — F410 Panic disorder [episodic paroxysmal anxiety] without agoraphobia: Secondary | ICD-10-CM

## 2012-06-12 DIAGNOSIS — F319 Bipolar disorder, unspecified: Secondary | ICD-10-CM

## 2012-06-12 DIAGNOSIS — I10 Essential (primary) hypertension: Secondary | ICD-10-CM

## 2012-06-12 DIAGNOSIS — R358 Other polyuria: Secondary | ICD-10-CM

## 2012-06-12 DIAGNOSIS — R3589 Other polyuria: Secondary | ICD-10-CM

## 2012-06-12 DIAGNOSIS — J309 Allergic rhinitis, unspecified: Secondary | ICD-10-CM

## 2012-06-12 LAB — COMPREHENSIVE METABOLIC PANEL
AST: 20 U/L (ref 0–37)
Albumin: 4.7 g/dL (ref 3.5–5.2)
Alkaline Phosphatase: 50 U/L (ref 39–117)
BUN: 17 mg/dL (ref 6–23)
Potassium: 4.1 mEq/L (ref 3.5–5.3)

## 2012-06-12 LAB — POCT URINALYSIS DIPSTICK
Ketones, UA: NEGATIVE
Leukocytes, UA: NEGATIVE
Nitrite, UA: NEGATIVE
Protein, UA: NEGATIVE
pH, UA: 6

## 2012-06-12 LAB — CBC
HCT: 41.1 % (ref 36.0–46.0)
MCHC: 33.6 g/dL (ref 30.0–36.0)
RDW: 14.8 % (ref 11.5–15.5)

## 2012-06-12 MED ORDER — CETIRIZINE HCL 10 MG PO TABS: 10.0000 mg | ORAL_TABLET | Freq: Every day | ORAL | Status: DC

## 2012-06-12 MED ORDER — LISINOPRIL-HYDROCHLOROTHIAZIDE 10-12.5 MG PO TABS: 1.0000 | ORAL_TABLET | Freq: Every day | ORAL | Status: DC

## 2012-06-12 MED ORDER — CLONAZEPAM 0.5 MG PO TABS: 0.5000 mg | ORAL_TABLET | Freq: Two times a day (BID) | ORAL | Status: DC | PRN

## 2012-06-12 MED ORDER — DIVALPROEX SODIUM ER 500 MG PO TB24: 1000.0000 mg | ORAL_TABLET | Freq: Every day | ORAL | Status: DC

## 2012-06-12 NOTE — Progress Notes (Signed)
  [facility]  Patient name: Kristin [name] MRN [id number]  Date of birth: November 03, 1975  CC & HPI:  Kristin [name] is a 37 y.o. female presenting today for follow up of:  # Bipolar disorder, anxiety: Patient has recently lost her Medicaid and was lost to followup from [facility].   #  Hypertension - chronic problem, marginally controlled.    Home BP checks/ranges: Not checking  no orthostasis  no chest pain, no dyspnea on exertion, no orthopnea/PND, no peripheral edema,   no episodes of unilateral weakness, dysarthria or acute visual changes    ------------------------------------------------------------------------------------------------------------------ Medication Compliance: Has been out of medications  Diet Compliance: probably noncompliant though I cannot elicit that specific history ------------------------------------------------------------------------------------------------------------------ New Concerns:  # Seasonal allergies: Reports that she's had issues with this for quite some time, she is reported to have tried a nasal medication previously without success.  Reports significant congestion  # Abdominal pain: Initially reported some dysuria but then later to me directly she did not have any dysuria this was more likely coming from the fact she is due to begin her menses.  This is typical for her to have some lower pelvic discomfort prior to.   ROS:  PER HPI  Pertinent History Reviewed:  Medical & Surgical Hx:  Reviewed: Significant for bipolar type I, insomnia, hypertension, current every day tobacco smoker, history of alcohol abuse, Medications: Reviewed & Updated - See associated section in EMR Social History: Reviewed -  reports that she has been smoking.  She does not have any smokeless tobacco history on file.   Objective Findings:  Vitals: BP 152/99  Pulse 75  Temp(Src) 98.3 F (36.8 C) (Oral)  Wt 193 lb (87.544 kg)  BMI 32.63  kg/m2  PE: GENERAL:  Adult African American female. In no discomfort; no respiratory distress. PSYCH: Alert and appropriately interactive; Insight:Lacking, flight of ideas and tangential thoughts however appropriate throughout exam.  Suspect some borderline cognitive ability H&N: AT/[place], trachea midline EENT:  MMM, no scleral icterus, EOMi HEART: RRR, S1/S2 heard, no murmur LUNGS: CTA B, no wheezes, no crackles EXTREMITIES: Moves all 4 extremities spontaneously, warm well perfused, no edema, bilateral DP and PT pulses 2/4.      Assessment & Plan:

## 2012-06-12 NOTE — Assessment & Plan Note (Signed)
Likely due to premenstrual symptoms.  UA negative

## 2012-06-12 NOTE — Assessment & Plan Note (Signed)
Likely contributing but did not readdress this today

## 2012-06-12 NOTE — Assessment & Plan Note (Signed)
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

## 2012-06-12 NOTE — Assessment & Plan Note (Addendum)
I have began this patient again on her prior Depakote dosage as well as her chronic benzodiazepine dosage.  She has to followup with [facility] center for continuation of this therapy in patient voices understanding of this today.  She has previously been seen and mood disorder clinic at [facility] and was ultimately referred to [facility] center for lack of remorse and significant anti-social characteristics. I check baseline labs and she is to followup in 4 weeks for recheck of CBC, CMET and Depakote level.  I have spoken with Dr. Pascal Lux who agrees that all further psychiatric care needs to come from a formal psychiatric office

## 2012-06-12 NOTE — Assessment & Plan Note (Signed)
Significantly elevated today.  Has been on hydrochlorothiazide the past with questionable and unclear medication compliance recently.  I feel that she'll benefit more from being on dual therapy and have started her on lisinopril hydrochlorothiazide.

## 2012-06-12 NOTE — Assessment & Plan Note (Signed)
Have refilled chronic benzodiazepines.  This will need to come from [facility] in the future

## 2012-06-12 NOTE — Patient Instructions (Addendum)
It was good to see you.  I have refilled your Klonopin and Depakote.  You need to be seen at [facility] to be able to continue this medication.  We need to recheck your labs in 4-6 weeks.  Please schedule a follow up appointment with me in 4 weeks.

## 2012-06-14 ENCOUNTER — Encounter: Payer: Self-pay | Admitting: Sports Medicine

## 2012-06-14 ENCOUNTER — Telehealth: Payer: Self-pay | Admitting: Sports Medicine

## 2012-06-14 NOTE — Telephone Encounter (Signed)
Patient is calling for results of urinalysis.

## 2012-06-14 NOTE — Telephone Encounter (Signed)
Returned call. Informed of normal urinalysis and other labs

## 2012-07-15 ENCOUNTER — Encounter: Payer: Medicaid Other | Admitting: Sports Medicine

## 2012-08-05 ENCOUNTER — Emergency Department (HOSPITAL_COMMUNITY): Payer: Medicaid Other

## 2012-08-05 ENCOUNTER — Emergency Department (HOSPITAL_COMMUNITY)
Admission: EM | Admit: 2012-08-05 | Discharge: 2012-08-05 | Disposition: A | Discharge status: Home / Self Care | Payer: Medicaid Other | Attending: Emergency Medicine | Admitting: Emergency Medicine

## 2012-08-05 ENCOUNTER — Encounter (HOSPITAL_COMMUNITY): Payer: Self-pay | Admitting: Emergency Medicine

## 2012-08-05 DIAGNOSIS — R1110 Vomiting, unspecified: Secondary | ICD-10-CM | POA: Insufficient documentation

## 2012-08-05 DIAGNOSIS — Z3202 Encounter for pregnancy test, result negative: Secondary | ICD-10-CM | POA: Insufficient documentation

## 2012-08-05 DIAGNOSIS — N83209 Unspecified ovarian cyst, unspecified side: Secondary | ICD-10-CM | POA: Insufficient documentation

## 2012-08-05 DIAGNOSIS — F411 Generalized anxiety disorder: Secondary | ICD-10-CM | POA: Insufficient documentation

## 2012-08-05 DIAGNOSIS — G43909 Migraine, unspecified, not intractable, without status migrainosus: Secondary | ICD-10-CM | POA: Insufficient documentation

## 2012-08-05 DIAGNOSIS — F3289 Other specified depressive episodes: Secondary | ICD-10-CM | POA: Insufficient documentation

## 2012-08-05 DIAGNOSIS — F17200 Nicotine dependence, unspecified, uncomplicated: Secondary | ICD-10-CM | POA: Insufficient documentation

## 2012-08-05 DIAGNOSIS — F329 Major depressive disorder, single episode, unspecified: Secondary | ICD-10-CM | POA: Insufficient documentation

## 2012-08-05 DIAGNOSIS — I10 Essential (primary) hypertension: Secondary | ICD-10-CM | POA: Insufficient documentation

## 2012-08-05 LAB — COMPREHENSIVE METABOLIC PANEL
Albumin: 3.6 g/dL (ref 3.5–5.2)
BUN: 11 mg/dL (ref 6–23)
Calcium: 9 mg/dL (ref 8.4–10.5)
Creatinine, Ser: 0.8 mg/dL (ref 0.50–1.10)
Total Protein: 7.3 g/dL (ref 6.0–8.3)

## 2012-08-05 LAB — URINALYSIS, ROUTINE W REFLEX MICROSCOPIC
Leukocytes, UA: NEGATIVE
Nitrite: NEGATIVE
Specific Gravity, Urine: 1.032 — ABNORMAL HIGH (ref 1.005–1.030)
Urobilinogen, UA: 1 mg/dL (ref 0.0–1.0)
pH: 5.5 (ref 5.0–8.0)

## 2012-08-05 LAB — CBC WITH DIFFERENTIAL/PLATELET
Basophils Relative: 2 % — ABNORMAL HIGH (ref 0–1)
Eosinophils Absolute: 0.3 10*3/uL (ref 0.0–0.7)
Eosinophils Relative: 7 % — ABNORMAL HIGH (ref 0–5)
HCT: 37 % (ref 36.0–46.0)
Hemoglobin: 12.6 g/dL (ref 12.0–15.0)
MCH: 29.8 pg (ref 26.0–34.0)
MCHC: 34.1 g/dL (ref 30.0–36.0)
Monocytes Absolute: 0.5 10*3/uL (ref 0.1–1.0)
Monocytes Relative: 11 % (ref 3–12)
Neutrophils Relative %: 41 % — ABNORMAL LOW (ref 43–77)

## 2012-08-05 LAB — LIPASE, BLOOD: Lipase: 21 U/L (ref 11–59)

## 2012-08-05 MED ORDER — IBUPROFEN 600 MG PO TABS: 600.0000 mg | ORAL_TABLET | Freq: Four times a day (QID) | ORAL | Status: DC | PRN

## 2012-08-05 MED ORDER — SODIUM CHLORIDE 0.9 % IV BOLUS (SEPSIS)
1000.0000 mL | Freq: Once | INTRAVENOUS | Status: AC
  Administered 2012-08-05: 1000 mL via INTRAVENOUS

## 2012-08-05 MED ORDER — KETOROLAC TROMETHAMINE 30 MG/ML IJ SOLN
30.0000 mg | Freq: Once | INTRAMUSCULAR | Status: AC
  Administered 2012-08-05: 30 mg via INTRAVENOUS
  Filled 2012-08-05: qty 1

## 2012-08-05 MED ORDER — HYDROCODONE-ACETAMINOPHEN 5-325 MG PO TABS: 2.0000 | ORAL_TABLET | ORAL | Status: DC | PRN

## 2012-08-05 NOTE — ED Notes (Signed)
Pt c/o of abdominal pain that started yesterday but stopped and came back today around 1430. Vomited twice yesterday but has not today. Denies nausea at the moment.

## 2012-08-05 NOTE — ED Provider Notes (Signed)
History     CSN: [id number]  Arrival date & time 08/05/12  1513   First MD Initiated Contact with Patient 08/05/12 1532      Chief Complaint  Patient presents with  . Abdominal Pain     HPI Pt c/o of abdominal pain that started yesterday but stopped and came back today around 1430. Vomited twice yesterday but has not today. Denies nausea at the moment.  Past Medical History  Diagnosis Date  . Depression   . Anxiety   . Migraine headache   . Hypertension     History reviewed. No pertinent past surgical history.  History reviewed. No pertinent family history.  History  Substance Use Topics  . Smoking status: Current Every Day Smoker  . Smokeless tobacco: Not on file  . Alcohol Use: Yes     Comment: occassionally     OB History   Grav Para Term Preterm Abortions TAB SAB Ect Mult Living                  Review of Systems All other systems reviewed and are negative Allergies  Sumatriptan  Home Medications   Current Outpatient Rx  Name  Route  Sig  Dispense  Refill  . cetirizine (ZYRTEC) 10 MG tablet   Oral   Take 1 tablet (10 mg total) by mouth daily.   30 tablet   11   . clonazePAM (KLONOPIN) 0.5 MG tablet   Oral   Take 1 tablet (0.5 mg total) by mouth 2 (two) times daily as needed for anxiety.   60 tablet   1   . divalproex (DEPAKOTE ER) 500 MG 24 hr tablet   Oral   Take 500 mg by mouth daily.         Marland Kitchen lisinopril-hydrochlorothiazide (PRINZIDE) 10-12.5 MG per tablet   Oral   Take 1 tablet by mouth daily.   30 tablet   3   . HYDROcodone-acetaminophen (NORCO/VICODIN) 5-325 MG per tablet   Oral   Take 2 tablets by mouth every 4 (four) hours as needed for pain.   10 tablet   0   . ibuprofen (ADVIL,MOTRIN) 600 MG tablet   Oral   Take 1 tablet (600 mg total) by mouth every 6 (six) hours as needed for pain.   30 tablet   0     BP 122/75  Pulse 85  Temp(Src) 98 F (36.7 C)  Resp 20  SpO2 100%  LMP 06/15/2012  Physical Exam    Nursing note and vitals reviewed. Constitutional: She is oriented to person, place, and time. She appears well-developed and well-nourished. No distress.  HENT:  Head: Normocephalic and atraumatic.  Eyes: Pupils are equal, round, and reactive to light.  Neck: Normal range of motion.  Cardiovascular: Normal rate and intact distal pulses.   Pulmonary/Chest: No respiratory distress.  Abdominal: Normal appearance. She exhibits no distension. There is tenderness in the left lower quadrant. There is rebound. There is no rigidity and no guarding.    Musculoskeletal: Normal range of motion.  Neurological: She is alert and oriented to person, place, and time. No cranial nerve deficit.  Skin: Skin is warm and dry. No rash noted.  Psychiatric: She has a normal mood and affect. Her behavior is normal.    ED Course  Procedures (including critical care time) Medications  sodium chloride 0.9 % bolus 1,000 mL (0 mLs Intravenous Stopped 08/05/12 1732)  ketorolac (TORADOL) 30 MG/ML injection 30 mg (30 mg Intravenous Given  08/05/12 1754)    Labs Reviewed  CBC WITH DIFFERENTIAL - Abnormal; Notable for the following:    Neutrophils Relative % 41 (*)    Eosinophils Relative 7 (*)    Basophils Relative 2 (*)    All other components within normal limits  COMPREHENSIVE METABOLIC PANEL - Abnormal; Notable for the following:    Glucose, Bld 102 (*)    All other components within normal limits  URINALYSIS, ROUTINE W REFLEX MICROSCOPIC - Abnormal; Notable for the following:    APPearance CLOUDY (*)    Specific Gravity, Urine 1.032 (*)    All other components within normal limits  LIPASE, BLOOD  PREGNANCY, URINE   US Transvaginal Non-ob  08/05/2012   *RADIOLOGY REPORT*  Clinical Data: Pelvic pain.  LMP 06/14/2012  TRANSABDOMINAL AND TRANSVAGINAL ULTRASOUND OF PELVIS Technique:  Both transabdominal and transvaginal ultrasound examinations of the pelvis were performed. Transabdominal technique was performed  for global imaging of the pelvis including uterus, ovaries, adnexal regions, and pelvic cul-de-sac.  It was necessary to proceed with endovaginal exam following the transabdominal exam to visualize the uterus, endometrium, ovaries, adnexal regions.  Comparison:  None  Findings:  Uterus: 10.1 x 4.8 x 7.3 cm.  No mass.  Endometrium: 7.6 mm, normal in appearance.  Right ovary:  3.3 x 1.8 x 3.2 cm.  Left ovary: 4.3 x 3.8 x 6.4 cm. 2 complex cysts are present. Fine , reticular internal echoes are present.  No evidence for discrete solid components. These are similar in appearance and measure 2.5 x 2.6 x 2.3 cm and 2.0 x 2.4 x 2.3 cm.  Other findings: No free fluid  IMPRESSION:  1.  Normal appearance of the uterus and right ovary. 2.  2 complex cysts within the left ovary.  Findings are favored to be physiologic.  However, follow-up is suggested in 6-8 weeks to exclude persistent lesions.   Original Report Authenticated By: Norva Pavlov, M.D.   US Pelvis Complete  08/05/2012   *RADIOLOGY REPORT*  Clinical Data: Pelvic pain.  LMP 06/14/2012  TRANSABDOMINAL AND TRANSVAGINAL ULTRASOUND OF PELVIS Technique:  Both transabdominal and transvaginal ultrasound examinations of the pelvis were performed. Transabdominal technique was performed for global imaging of the pelvis including uterus, ovaries, adnexal regions, and pelvic cul-de-sac.  It was necessary to proceed with endovaginal exam following the transabdominal exam to visualize the uterus, endometrium, ovaries, adnexal regions.  Comparison:  None  Findings:  Uterus: 10.1 x 4.8 x 7.3 cm.  No mass.  Endometrium: 7.6 mm, normal in appearance.  Right ovary:  3.3 x 1.8 x 3.2 cm.  Left ovary: 4.3 x 3.8 x 6.4 cm. 2 complex cysts are present. Fine , reticular internal echoes are present.  No evidence for discrete solid components. These are similar in appearance and measure 2.5 x 2.6 x 2.3 cm and 2.0 x 2.4 x 2.3 cm.  Other findings: No free fluid  IMPRESSION:  1.  Normal  appearance of the uterus and right ovary. 2.  2 complex cysts within the left ovary.  Findings are favored to be physiologic.  However, follow-up is suggested in 6-8 weeks to exclude persistent lesions.   Original Report Authenticated By: Norva Pavlov, M.D.     1. Ovarian cyst       MDM          Nelia Shi, MD 08/05/12 [phone]

## 2012-08-05 NOTE — ED Notes (Signed)
US in Rm 

## 2012-08-05 NOTE — ED Notes (Signed)
Pt asked to give urine specimen but can't void. Nurse aware

## 2012-08-20 ENCOUNTER — Encounter: Payer: Medicaid Other | Admitting: Sports Medicine

## 2012-08-30 ENCOUNTER — Encounter: Payer: Self-pay | Admitting: Family Medicine

## 2012-08-30 ENCOUNTER — Ambulatory Visit (INDEPENDENT_AMBULATORY_CARE_PROVIDER_SITE_OTHER): Payer: Medicaid Other | Admitting: Family Medicine

## 2012-08-30 VITALS — BP 150/98 | HR 67 | Temp 98.7°F | Wt 199.9 lb

## 2012-08-30 DIAGNOSIS — N912 Amenorrhea, unspecified: Secondary | ICD-10-CM

## 2012-08-30 DIAGNOSIS — Z3201 Encounter for pregnancy test, result positive: Secondary | ICD-10-CM

## 2012-08-30 LAB — POCT URINE PREGNANCY: Preg Test, Ur: POSITIVE

## 2012-08-30 MED ORDER — PRENATAL VITAMINS (DIS) PO TABS: 1.0000 | ORAL_TABLET | Freq: Once | ORAL | Status: DC

## 2012-08-30 MED ORDER — GNP PRENATAL VITAMINS 28-0.8 MG PO TABS: 1.0000 | ORAL_TABLET | Freq: Once | ORAL | Status: DC

## 2012-08-30 NOTE — Assessment & Plan Note (Signed)
-   Positive pregnancy test at home and in office today. LMP: 08/05/12 - Patient does not believe she wants to continue the pregnancy. Does not offer reason. FOB is not in the picture full time. Her other children are grown.  - Prescribed PNV in the event she were to continue the pregnancy and encouraged her to start them today, until she makes her final decision.   - Encouraged her to make decision promptly in order to receive the most ideal outcome, with whatever she chooses. Explained the importance of early prenatal care.  - Stressed that she is currently taking medications that has been proven to have negative outcomes with pregnancy (depakote) and WILL cause the fetus harm.  - AVS on first trimester pregnancy and contact information for [facility].  - F/U: As she makes her decision, as soon as possible

## 2012-08-30 NOTE — Progress Notes (Signed)
Subjective:     Patient ID: Kristin [name], female   DOB: 12/18/1975, 37 y.o.   MRN: [id number]  HPI  Pregnancy: pateint is here today because she feels she may be pregnant. Her last LMP was 08/05/12, she has experienced breast tenderness, fatigue and nausea. This is an unplanned pregnancy, and she is uncertain if she would like to continue the pregancy if she is pregnant. The FOB is not in her life full time currently. She is a [id number]. She reports her periods are irregular between 28 -40 days and are usually quite heavy, requiring her to use a pad and a tampon together changing both about every 1 hour for the first few days. Her periods last about 6 days. She has an appointment next week to see Dr. [name] about her menses problems. He children are all grown. She is on Depakote and takes Advil on occasions.   Review of Systems See above HPI    Objective:   Physical Exam BP 150/98  Pulse 67  Temp(Src) 98.7 F (37.1 C) (Oral)  Wt 199 lb 14.4 oz (90.674 kg)  BMI 33.8 kg/m2  LMP 06/15/2012 CV: RRR Chest: CTAB ABD: Soft, NTND. BS present Ext: No erythema or edema

## 2012-08-30 NOTE — Patient Instructions (Addendum)
It was nice to meet you today. Your pregnancy test is positive. I have called you in PNV. It is important to make your decision as soon as possible as you are on medications that would need changed d/t their negative effects on a fetus. The earlier you initiate your care and start prenatal care the better for you and your baby.   Pregnancy - First Trimester During sexual intercourse, millions of sperm go into the vagina. Only 1 sperm will penetrate and fertilize the female egg while it is in the Fallopian tube. One week later, the fertilized egg implants into the wall of the uterus. An embryo begins to develop into a baby. At 6 to 8 weeks, the eyes and face are formed and the heartbeat can be seen on ultrasound. At the end of 12 weeks (first trimester), all the baby's organs are formed. Now that you are pregnant, you will want to do everything you can to have a healthy baby. Two of the most important things are to get good prenatal care and follow your caregiver's instructions. Prenatal care is all the medical care you receive before the baby's birth. It is given to prevent, find, and treat problems during the pregnancy and childbirth. PRENATAL EXAMS  During prenatal visits, your weight, blood pressure, and urine are checked. This is done to make sure you are healthy and progressing normally during the pregnancy.  A pregnant woman should gain 25 to 35 pounds during the pregnancy. However, if you are overweight or underweight, your caregiver will advise you regarding your weight.  Your caregiver will ask and answer questions for you.  Blood work, cervical cultures, other necessary tests, and a Pap test are done during your prenatal exams. These tests are done to check on your health and the probable health of your baby. Tests are strongly recommended and done for HIV with your permission. This is the virus that causes AIDS. These tests are done because medicines can be given to help prevent your baby from  being born with this infection should you have been infected without knowing it. Blood work is also used to find out your blood type, previous infections, and follow your blood levels (hemoglobin).  Low hemoglobin (anemia) is common during pregnancy. Iron and vitamins are given to help prevent this. Later in the pregnancy, blood tests for diabetes will be done along with any other tests if any problems develop.  You may need other tests to make sure you and the baby are doing well. CHANGES DURING THE FIRST TRIMESTER  Your body goes through many changes during pregnancy. They vary from person to person. Talk to your caregiver about changes you notice and are concerned about. Changes can include:  Your menstrual period stops.  The egg and sperm carry the genes that determine what you look like. Genes from you and your partner are forming a baby. The female genes determine whether the baby is a boy or a girl.  Your body increases in girth and you may feel bloated.  Feeling sick to your stomach (nauseous) and throwing up (vomiting). If the vomiting is uncontrollable, call your caregiver.  Your breasts will begin to enlarge and become tender.  Your nipples may stick out more and become darker.  The need to urinate more. Painful urination may mean you have a bladder infection.  Tiring easily.  Loss of appetite.  Cravings for certain kinds of food.  At first, you may gain or lose a couple of pounds.  You may have changes in your emotions from day to day (excited to be pregnant or concerned something may go wrong with the pregnancy and baby).  You may have more vivid and strange dreams. HOME CARE INSTRUCTIONS   It is very important to avoid all smoking, alcohol and non-prescribed drugs during your pregnancy. These affect the formation and growth of the baby. Avoid chemicals while pregnant to ensure the delivery of a healthy infant.  Start your prenatal visits by the 12th week of pregnancy.  They are usually scheduled monthly at first, then more often in the last 2 months before delivery. Keep your caregiver's appointments. Follow your caregiver's instructions regarding medicine use, blood and lab tests, exercise, and diet.  During pregnancy, you are providing food for you and your baby. Eat regular, well-balanced meals. Choose foods such as meat, fish, milk and other low fat dairy products, vegetables, fruits, and whole-grain breads and cereals. Your caregiver will tell you of the ideal weight gain.  You can help morning sickness by keeping soda crackers at the bedside. Eat a couple before arising in the morning. You may want to use the crackers without salt on them.  Eating 4 to 5 small meals rather than 3 large meals a day also may help the nausea and vomiting.  Drinking liquids between meals instead of during meals also seems to help nausea and vomiting.  A physical sexual relationship may be continued throughout pregnancy if there are no other problems. Problems may be early (premature) leaking of amniotic fluid from the membranes, vaginal bleeding, or belly (abdominal) pain.  Exercise regularly if there are no restrictions. Check with your caregiver or physical therapist if you are unsure of the safety of some of your exercises. Greater weight gain will occur in the last 2 trimesters of pregnancy. Exercising will help:  Control your weight.  Keep you in shape.  Prepare you for labor and delivery.  Help you lose your pregnancy weight after you deliver your baby.  Wear a good support or jogging bra for breast tenderness during pregnancy. This may help if worn during sleep too.  Ask when prenatal classes are available. Begin classes when they are offered.  Do not use hot tubs, steam rooms, or saunas.  Wear your seat belt when driving. This protects you and your baby if you are in an accident.  Avoid raw meat, uncooked cheese, cat litter boxes, and soil used by cats  throughout the pregnancy. These carry germs that can cause birth defects in the baby.  The first trimester is a good time to visit your dentist for your dental health. Getting your teeth cleaned is okay. Use a softer toothbrush and brush gently during pregnancy.  Ask for help if you have financial, counseling, or nutritional needs during pregnancy. Your caregiver will be able to offer counseling for these needs as well as refer you for other special needs.  Do not take any medicines or herbs unless told by your caregiver.  Inform your caregiver if there is any mental or physical domestic violence.  Make a list of emergency phone numbers of family, friends, hospital, and police and fire departments.  Write down your questions. Take them to your prenatal visit.  Do not douche.  Do not cross your legs.  If you have to stand for long periods of time, rotate you feet or take small steps in a circle.  You may have more vaginal secretions that may require a sanitary pad. Do not use  tampons or scented sanitary pads. MEDICINES AND DRUG USE IN PREGNANCY  Take prenatal vitamins as directed. The vitamin should contain 1 milligram of folic acid. Keep all vitamins out of reach of children. Only a couple vitamins or tablets containing iron may be fatal to a baby or young child when ingested.  Avoid use of all medicines, including herbs, over-the-counter medicines, not prescribed or suggested by your caregiver. Only take over-the-counter or prescription medicines for pain, discomfort, or fever as directed by your caregiver. Do not use aspirin, ibuprofen, or naproxen unless directed by your caregiver.  Let your caregiver also know about herbs you may be using.  Alcohol is related to a number of birth defects. This includes fetal alcohol syndrome. All alcohol, in any form, should be avoided completely. Smoking will cause low birth rate and premature babies.  Street or illegal drugs are very harmful to  the baby. They are absolutely forbidden. A baby born to an addicted mother will be addicted at birth. The baby will go through the same withdrawal an adult does.  Let your caregiver know about any medicines that you have to take and for what reason you take them. SEEK MEDICAL CARE IF:  You have any concerns or worries during your pregnancy. It is better to call with your questions if you feel they cannot wait, rather than worry about them. SEEK IMMEDIATE MEDICAL CARE IF:   An unexplained oral temperature above 102 F (38.9 C) develops, or as your caregiver suggests.  You have leaking of fluid from the vagina (birth canal). If leaking membranes are suspected, take your temperature and inform your caregiver of this when you call.  There is vaginal spotting or bleeding. Notify your caregiver of the amount and how many pads are used.  You develop a bad smelling vaginal discharge with a change in the color.  You continue to feel sick to your stomach (nauseated) and have no relief from remedies suggested. You vomit blood or coffee ground-like materials.  You lose more than 2 pounds of weight in 1 week.  You gain more than 2 pounds of weight in 1 week and you notice swelling of your face, hands, feet, or legs.  You gain 5 pounds or more in 1 week (even if you do not have swelling of your hands, face, legs, or feet).  You get exposed to [place] measles and have never had them.  You are exposed to fifth disease or chickenpox.  You develop belly (abdominal) pain. Round ligament discomfort is a common non-cancerous (benign) cause of abdominal pain in pregnancy. Your caregiver still must evaluate this.  You develop headache, fever, diarrhea, pain with urination, or shortness of breath.  You fall or are in a car accident or have any kind of trauma.  There is mental or physical violence in your home. Document Released: 02/21/2001 Document Revised: 11/22/2011 Document Reviewed:  08/25/2008 [facility] Patient Information 2014 [place], [place].

## 2012-09-04 ENCOUNTER — Ambulatory Visit (INDEPENDENT_AMBULATORY_CARE_PROVIDER_SITE_OTHER): Payer: Medicaid Other | Admitting: Family Medicine

## 2012-09-04 ENCOUNTER — Other Ambulatory Visit (HOSPITAL_COMMUNITY)
Admission: RE | Admit: 2012-09-04 | Discharge: 2012-09-04 | Disposition: A | Discharge status: Home / Self Care | Payer: Medicaid Other | Source: Ambulatory Visit | Attending: Family Medicine | Admitting: Family Medicine

## 2012-09-04 ENCOUNTER — Encounter: Payer: Self-pay | Admitting: Family Medicine

## 2012-09-04 VITALS — BP 144/90 | HR 70 | Temp 98.2°F | Ht 64.5 in | Wt 196.8 lb

## 2012-09-04 DIAGNOSIS — Z113 Encounter for screening for infections with a predominantly sexual mode of transmission: Secondary | ICD-10-CM

## 2012-09-04 DIAGNOSIS — N920 Excessive and frequent menstruation with regular cycle: Secondary | ICD-10-CM | POA: Insufficient documentation

## 2012-09-04 DIAGNOSIS — Z01419 Encounter for gynecological examination (general) (routine) without abnormal findings: Secondary | ICD-10-CM | POA: Insufficient documentation

## 2012-09-04 DIAGNOSIS — N83209 Unspecified ovarian cyst, unspecified side: Secondary | ICD-10-CM

## 2012-09-04 DIAGNOSIS — Z124 Encounter for screening for malignant neoplasm of cervix: Secondary | ICD-10-CM

## 2012-09-04 DIAGNOSIS — Z1151 Encounter for screening for human papillomavirus (HPV): Secondary | ICD-10-CM | POA: Insufficient documentation

## 2012-09-04 NOTE — Assessment & Plan Note (Addendum)
F/u U/s-pt. Given 10 Vicodin 1 month ago.  Should not require further meds.  Also has minimal tenderness on exam.

## 2012-09-04 NOTE — Patient Instructions (Signed)
Menorrhagia Dysfunctional uterine bleeding is different from a normal menstrual period. When periods are heavy or there is more bleeding than is usual for you, it is called menorrhagia. It may be caused by hormonal imbalance, or physical, metabolic, or other problems. Examination is necessary in order that your caregiver may treat treatable causes. If this is a continuing problem, a D&C may be needed. That means that the cervix (the opening of the uterus or womb) is dilated (stretched larger) and the lining of the uterus is scraped out. The tissue scraped out is then examined under a microscope by a specialist (pathologist) to make sure there is nothing of concern that needs further or more extensive treatment. HOME CARE INSTRUCTIONS   If medications were prescribed, take exactly as directed. Do not change or switch medications without consulting your caregiver.  Long term heavy bleeding may result in iron deficiency. Your caregiver may have prescribed iron pills. They help replace the iron your body lost from heavy bleeding. Take exactly as directed. Iron may cause constipation. If this becomes a problem, increase the bran, fruits, and roughage in your diet.  Do not take aspirin or medicines that contain aspirin one week before or during your menstrual period. Aspirin may make the bleeding worse.  If you need to change your sanitary pad or tampon more than once every 2 hours, stay in bed and rest as much as possible until the bleeding stops.  Eat well-balanced meals. Eat foods high in iron. Examples are leafy green vegetables, meat, liver, eggs, and whole grain breads and cereals. Do not try to lose weight until the abnormal bleeding has stopped and your blood iron level is back to normal. SEEK MEDICAL CARE IF:   You need to change your sanitary pad or tampon more than once an hour.  You develop nausea (feeling sick to your stomach) and vomiting, dizziness, or diarrhea while you are taking your  medicine.  You have any problems that may be related to the medicine you are taking. SEEK IMMEDIATE MEDICAL CARE IF:   You have a fever.  You develop chills.  You develop severe bleeding or start to pass blood clots.  You feel dizzy or faint. MAKE SURE YOU:   Understand these instructions.  Will watch your condition.  Will get help right away if you are not doing well or get worse. Document Released: 02/27/2005 Document Revised: 05/22/2011 Document Reviewed: 10/18/2007 [facility] Patient Information 2014 [place], [place]. Ovarian Cyst The ovaries are small organs that are on each side of the uterus. The ovaries are the organs that produce the female hormones, estrogen and progesterone. An ovarian cyst is a sac filled with fluid that can vary in its size. It is normal for a small cyst to form in women who are in the childbearing age and who have menstrual periods. This type of cyst is called a follicle cyst that becomes an ovulation cyst (corpus luteum cyst) after it produces the women's egg. It later goes away on its own if the woman does not become pregnant. There are other kinds of ovarian cysts that may cause problems and may need to be treated. The most serious problem is a cyst with cancer. It should be noted that menopausal women who have an ovarian cyst are at a higher risk of it being a cancer cyst. They should be evaluated very quickly, thoroughly and followed closely. This is especially true in menopausal women because of the high rate of ovarian cancer in women in  menopause. CAUSES AND TYPES OF OVARIAN CYSTS:  FUNCTIONAL CYST: The follicle/corpus luteum cyst is a functional cyst that occurs every month during ovulation with the menstrual cycle. They go away with the next menstrual cycle if the woman does not get pregnant. Usually, there are no symptoms with a functional cyst.  ENDOMETRIOMA CYST: This cyst develops from the lining of the uterus tissue. This cyst gets in or on the  ovary. It grows every month from the bleeding during the menstrual period. It is also called a "chocolate cyst" because it becomes filled with blood that turns brown. This cyst can cause pain in the lower abdomen during intercourse and with your menstrual period.  CYSTADENOMA CYST: This cyst develops from the cells on the outside of the ovary. They usually are not cancerous. They can get very big and cause lower abdomen pain and pain with intercourse. This type of cyst can twist on itself, cut off its blood supply and cause severe pain. It also can easily rupture and cause a lot of pain.  DERMOID CYST: This type of cyst is sometimes found in both ovaries. They are found to have different kinds of body tissue in the cyst. The tissue includes skin, teeth, hair, and/or cartilage. They usually do not have symptoms unless they get very big. Dermoid cysts are rarely cancerous.  POLYCYSTIC OVARY: This is a rare condition with hormone problems that produces many small cysts on both ovaries. The cysts are follicle-like cysts that never produce an egg and become a corpus luteum. It can cause an increase in body weight, infertility, acne, increase in body and facial hair and lack of menstrual periods or rare menstrual periods. Many women with this problem develop type 2 diabetes. The exact cause of this problem is unknown. A polycystic ovary is rarely cancerous.  THECA LUTEIN CYST: Occurs when too much hormone (human chorionic gonadotropin) is produced and over-stimulates the ovaries to produce an egg. They are frequently seen when doctors stimulate the ovaries for invitro-fertilization (test tube babies).  LUTEOMA CYST: This cyst is seen during pregnancy. Rarely it can cause an obstruction to the birth canal during labor and delivery. They usually go away after delivery. SYMPTOMS   Pelvic pain or pressure.  Pain during sexual intercourse.  Increasing girth (swelling) of the abdomen.  Abnormal menstrual  periods.  Increasing pain with menstrual periods.  You stop having menstrual periods and you are not pregnant. DIAGNOSIS  The diagnosis can be made during:  Routine or annual pelvic examination (common).  Ultrasound.  X-ray of the pelvis.  CT Scan.  MRI.  Blood tests. TREATMENT   Treatment may only be to follow the cyst monthly for 2 to 3 months with your caregiver. Many go away on their own, especially functional cysts.  May be aspirated (drained) with a long needle with ultrasound, or by laparoscopy (inserting a tube into the pelvis through a small incision).  The whole cyst can be removed by laparoscopy.  Sometimes the cyst may need to be removed through an incision in the lower abdomen.  Hormone treatment is sometimes used to help dissolve certain cysts.  Birth control pills are sometimes used to help dissolve certain cysts. HOME CARE INSTRUCTIONS  Follow your caregiver's advice regarding:  Medicine.  Follow up visits to evaluate and treat the cyst.  You may need to come back or make an appointment with another caregiver, to find the exact cause of your cyst, if your caregiver is not a gynecologist.  Get  your yearly and recommended pelvic examinations and Pap tests.  Let your caregiver know if you have had an ovarian cyst in the past. SEEK MEDICAL CARE IF:   Your periods are late, irregular, they stop, or are painful.  Your stomach (abdomen) or pelvic pain does not go away.  Your stomach becomes larger or swollen.  You have pressure on your bladder or trouble emptying your bladder completely.  You have painful sexual intercourse.  You have feelings of fullness, pressure, or discomfort in your stomach.  You lose weight for no apparent reason.  You feel generally ill.  You become constipated.  You lose your appetite.  You develop acne.  You have an increase in body and facial hair.  You are gaining weight, without changing your exercise and  eating habits.  You think you are pregnant. SEEK IMMEDIATE MEDICAL CARE IF:   You have increasing abdominal pain.  You feel sick to your stomach (nausea) and/or vomit.  You develop a fever that comes on suddenly.  You develop abdominal pain during a bowel movement.  Your menstrual periods become heavier than usual. Document Released: 02/27/2005 Document Revised: 05/22/2011 Document Reviewed: 12/31/2008 [facility] Patient Information 2014 [place], [place].

## 2012-09-04 NOTE — Assessment & Plan Note (Signed)
Unclear etiology, has nml pelvic sono.  Check TSH.  May need EMB.

## 2012-09-04 NOTE — Progress Notes (Signed)
Subjective:    Patient ID: Kristin [name], female    DOB: [birth year], 37 y.o.   MRN: [id number]  HPI  New pt.  Referred from ED when presented with abdominal pain.  Found 2 physiologic cysts.  Needs f/u u/s.  Given 10 vicodin, is out now.  Requesting more.  States pain is improved.  Also reports heavy cycles x 2 years.  No pap for a while.  Sen with pregnancy 4 days ago.  States, "I am not pregnant now". Had medical termination.  Is bleeding.  No pap for some time 2008.  Past Medical History  Diagnosis Date  . Depression   . Anxiety   . Migraine headache   . Hypertension    History reviewed. No pertinent past surgical history. Family History  Problem Relation Age of Onset  . Cancer Maternal Grandmother     Breast  . Hypertension Mother   . Hypertension Brother   . Cancer Maternal Uncle     colon   History   Social History  . Marital Status: Single    Spouse Name: N/A    Number of Children: N/A  . Years of Education: N/A   Occupational History  . Not on file.   Social History Main Topics  . Smoking status: Current Every Day Smoker  . Smokeless tobacco: Not on file  . Alcohol Use: Yes     Comment: occassionally   . Drug Use: No  . Sexually Active: Yes   Other Topics Concern  . Not on file   Social History Narrative  . No narrative on file   Current Outpatient Prescriptions on File Prior to Visit  Medication Sig Dispense Refill  . cetirizine (ZYRTEC) 10 MG tablet Take 1 tablet (10 mg total) by mouth daily.  30 tablet  11  . clonazePAM (KLONOPIN) 0.5 MG tablet Take 1 tablet (0.5 mg total) by mouth 2 (two) times daily as needed for anxiety.  60 tablet  1  . divalproex (DEPAKOTE ER) 500 MG 24 hr tablet Take 500 mg by mouth daily.      Marland Kitchen ibuprofen (ADVIL,MOTRIN) 600 MG tablet Take 1 tablet (600 mg total) by mouth every 6 (six) hours as needed for pain.  30 tablet  0  . lisinopril-hydrochlorothiazide (PRINZIDE) 10-12.5 MG per tablet Take 1 tablet by mouth daily.  30  tablet  3   No current facility-administered medications on file prior to visit.   Allergies  Allergen Reactions  . Sumatriptan Anaphylaxis     Review of Systems  Constitutional: Negative for fever and fatigue.  Respiratory: Negative for shortness of breath.   Cardiovascular: Negative for chest pain.  Gastrointestinal: Positive for abdominal pain. Negative for nausea and vomiting.  Genitourinary: Positive for vaginal bleeding.  Skin: Negative for rash.       Objective:   Physical Exam  Vitals reviewed. Constitutional: She is oriented to person, place, and time. She appears well-developed and well-nourished. No distress.  HENT:  Head: Normocephalic and atraumatic.  Neck: Neck supple.  Cardiovascular: Normal rate.   Pulmonary/Chest: Effort normal. She has no rales.  Abdominal: Soft. There is no tenderness.  Genitourinary: Vagina normal and uterus normal.  NEFG, BUS WNL, cervix without lesions.  Adnexa without mass or tenderness.  Musculoskeletal: Normal range of motion.  Neurological: She is alert and oriented to person, place, and time.  Skin: Skin is warm and dry.          Assessment & Plan:  See problem list Pap  smear today STD screen.

## 2012-09-05 LAB — TSH: TSH: 0.501 u[IU]/mL (ref 0.350–4.500)

## 2012-09-09 ENCOUNTER — Telehealth: Payer: Self-pay | Admitting: General Practice

## 2012-09-09 NOTE — Telephone Encounter (Signed)
Message copied by Kathee Delton on Mon Sep 09, 2012  4:13 PM ------      Message from: Reva Bores      Created: Mon Sep 09, 2012 11:20 AM       ASCUS pap with negative HPV--f/u pap in 1 year. ------

## 2012-09-09 NOTE — Telephone Encounter (Signed)
Called and informed patient of results and recommendations. Patient verbalized understanding and asked about the results of her other tests. Informed patient everything else was normal/negative. Patient verbalized understanding and had no further questions

## 2012-10-09 ENCOUNTER — Encounter: Payer: Self-pay | Admitting: Obstetrics & Gynecology

## 2012-10-09 ENCOUNTER — Ambulatory Visit (INDEPENDENT_AMBULATORY_CARE_PROVIDER_SITE_OTHER): Payer: Medicaid Other | Admitting: Obstetrics & Gynecology

## 2012-10-09 VITALS — BP 140/92 | HR 63 | Temp 98.0°F | Resp 20 | Ht 64.5 in | Wt 193.2 lb

## 2012-10-09 DIAGNOSIS — R102 Pelvic and perineal pain: Secondary | ICD-10-CM

## 2012-10-09 DIAGNOSIS — N83209 Unspecified ovarian cyst, unspecified side: Secondary | ICD-10-CM

## 2012-10-09 DIAGNOSIS — N949 Unspecified condition associated with female genital organs and menstrual cycle: Secondary | ICD-10-CM

## 2012-10-09 MED ORDER — MELOXICAM 15 MG PO TABS: 15.0000 mg | ORAL_TABLET | Freq: Every day | ORAL | Status: DC

## 2012-10-09 NOTE — Progress Notes (Addendum)
  Subjective:    Patient ID: Kristin [name], female    DOB: 12-21-1975, 37 y.o.   MRN: [id number]  HPI Pt here for f/u of pelvic pain.   She states that she has had this intermittent crampy BL pelvic pain for approx 2 months. She had some relief with ibuprofen and vicoden from the ED. She states that it is worsened by bending and states that sometimes its so severe that she cannot sit down.   She also states that she has had heavy periods for approx 2 years.  She had a + upreg in June and states that she has had an elective ab since then.   She also states she is having dyspareunia on insertion. Which is new in the last 2 months.   Review of Systems Per HPI    Objective:   Physical Exam  Gen: NAD, alert, cooperative with exam HEENT: NCAT CV: RRR, good S1/S2, no murmur Resp: CTABL, no wheezes, non-labored Abd: Soft, mild tenderness to palpation of BL lower quadrants, no rebound, no rigidity.      Assessment & Plan:   Pelvic pain - Cysts seen on [place] in 06/2012 complex small and likely physiologic vs hemmorhagic - GCC, HIV, RPR negative at last visit.  - ASCUS with negative HPV at last visit- f/u year.  - Rx for Mobic 15 mg daily, dc ibuprofen (pt is out), repeat US, f/u 4 weeks.   Elenora Gamma, MD    Attestation of Attending Supervision of Resident: Evaluation and management procedures were performed by the [facility] Medicine Resident under my supervision.  I have seen and examined the patient, reviewed the resident's note and chart, and I agree with the management and plan.  Jaynie Collins, MD, FACOG Attending Obstetrician & Gynecologist Faculty Practice, [facility] of [place]

## 2012-10-09 NOTE — Patient Instructions (Addendum)
Follow up 4 weeks for pelvic pain  Your ultrasound will be scheduled.

## 2012-10-09 NOTE — Progress Notes (Signed)
Pt states she continues to have pain daily- some days it is stronger than others.

## 2012-10-15 ENCOUNTER — Telehealth: Payer: Self-pay

## 2012-10-15 ENCOUNTER — Ambulatory Visit (HOSPITAL_COMMUNITY)
Admission: RE | Admit: 2012-10-15 | Discharge: 2012-10-15 | Disposition: A | Discharge status: Home / Self Care | Payer: Medicaid Other | Source: Ambulatory Visit | Attending: Family Medicine | Admitting: Family Medicine

## 2012-10-15 DIAGNOSIS — N83209 Unspecified ovarian cyst, unspecified side: Secondary | ICD-10-CM | POA: Insufficient documentation

## 2012-10-15 NOTE — Telephone Encounter (Signed)
Message copied by Faythe Casa on Tue Oct 15, 2012  4:31 PM ------      Message from: Reva Bores      Created: Tue Oct 15, 2012  4:27 PM       Ovarian cyst has resolved-please inform pt. ------

## 2012-10-15 NOTE — Telephone Encounter (Signed)
Called pt and informed pt that her ovarian cysts have resolved. Pt stated understanding and wanted to know if she had an appt scheduled.  I informed pt that she does have an appt sheduled for Sept 4th @ 215pm.  Pt stated "ok".

## 2012-11-06 ENCOUNTER — Ambulatory Visit (INDEPENDENT_AMBULATORY_CARE_PROVIDER_SITE_OTHER): Payer: Medicaid Other | Admitting: *Deleted

## 2012-11-06 DIAGNOSIS — Z111 Encounter for screening for respiratory tuberculosis: Secondary | ICD-10-CM

## 2012-11-06 NOTE — Progress Notes (Signed)
PPD Placement note Kristin [name], 37 y.o. female is here today for placement of PPD test Reason for PPD test: work  Pt taken PPD test before: yes Verified in allergy area and with patient that they are not allergic to the products PPD is made of (Phenol or Tween). Yes Is patient taking any oral or IV steroid medication now or have they taken it in the last month? no Has the patient ever received the BCG vaccine?: no Has the patient been in recent contact with anyone known or suspected of having active TB disease?: no      O: Alert and oriented in NAD. P:  PPD placed on 11/06/2012.  Patient advised to return for reading within 48-72 hours. Wyatt Haste, RN-BSN

## 2012-11-08 ENCOUNTER — Encounter: Payer: Self-pay | Admitting: *Deleted

## 2012-11-08 ENCOUNTER — Ambulatory Visit (INDEPENDENT_AMBULATORY_CARE_PROVIDER_SITE_OTHER): Payer: Medicaid Other | Admitting: *Deleted

## 2012-11-08 DIAGNOSIS — Z111 Encounter for screening for respiratory tuberculosis: Secondary | ICD-10-CM

## 2012-11-08 DIAGNOSIS — Z09 Encounter for follow-up examination after completed treatment for conditions other than malignant neoplasm: Secondary | ICD-10-CM

## 2012-11-14 ENCOUNTER — Ambulatory Visit: Payer: Medicaid Other | Admitting: Obstetrics & Gynecology

## 2012-11-17 NOTE — Progress Notes (Signed)
PPD Reading Note PPD read and results entered in EpicCare. Result: 0 mm induration. Interpretation: Negative Deetta Siegmann Ann, RN  

## 2012-12-04 ENCOUNTER — Other Ambulatory Visit: Payer: Self-pay | Admitting: Sports Medicine

## 2012-12-04 MED ORDER — LISINOPRIL-HYDROCHLOROTHIAZIDE 10-12.5 MG PO TABS: 1.0000 | ORAL_TABLET | Freq: Every day | ORAL | Status: DC

## 2012-12-16 ENCOUNTER — Encounter: Payer: Self-pay | Admitting: Family Medicine

## 2012-12-16 ENCOUNTER — Ambulatory Visit (INDEPENDENT_AMBULATORY_CARE_PROVIDER_SITE_OTHER): Payer: Medicaid Other | Admitting: Family Medicine

## 2012-12-16 VITALS — BP 142/82 | HR 80 | Temp 99.3°F | Ht 64.5 in | Wt 205.0 lb

## 2012-12-16 DIAGNOSIS — J111 Influenza due to unidentified influenza virus with other respiratory manifestations: Secondary | ICD-10-CM

## 2012-12-16 MED ORDER — OSELTAMIVIR PHOSPHATE 75 MG PO CAPS: 75.0000 mg | ORAL_CAPSULE | Freq: Two times a day (BID) | ORAL | Status: AC

## 2012-12-16 MED ORDER — ONDANSETRON 8 MG PO TBDP: 8.0000 mg | ORAL_TABLET | Freq: Three times a day (TID) | ORAL | Status: DC | PRN

## 2012-12-16 MED ORDER — IBUPROFEN 600 MG PO TABS: 600.0000 mg | ORAL_TABLET | Freq: Four times a day (QID) | ORAL | Status: DC | PRN

## 2012-12-16 NOTE — Patient Instructions (Addendum)
Ms. Dieter,  Thank you for coming in today. Your symptoms are consistent with the flu.  Please start tamiflu, one pill twice daily for 5 days.  Take Zofran if you feel nauseated by the tamiflu.   Also  please do the following: 1. Take in plenty of fluids. Hot tea, soup etc will help open your nasal passages. 2. Mucinex/robitussin (guaifenessin) to break up chest congestion.  3. Alternate tylenol (acetominophen) and ibuprofen (motrin) every 4 hrs as needed for fever and pain. 4. Nasal saline for stuffy nose.   Call and schedule a follow if you develop chest pain, shortness of breath, persistent high fever temp > 102 .  Dr. Armen Pickup

## 2012-12-16 NOTE — Progress Notes (Signed)
  Subjective:    Patient ID: Rockne Coons, female    DOB: [birth year], 37 y.o.   MRN: [id number]  URI   Fever    -37-year-old female presents with a significant other for same-day visit to discuss the following:  #1 bodyaches: Patient reports 2 days of body aches associated with nasal congestion, frontal headache, cough productive of green sputum, mild to moderate abdominal discomfort and nausea. Patient has sick contacts in the form of her significant other who had similar symptoms last week. She denies associated fever, chest pain, shortness of breath, emesis.  Social history, patient is a smoker she smokes Lasix per day. Review of Systems   As per history of present illness    Objective:   Physical Exam BP 142/82  Pulse 80  Temp(Src) 99.3 F (37.4 C) (Oral)  Ht 5' 4.5" (1.638 m)  Wt 205 lb (92.987 kg)  BMI 34.66 kg/m2  SpO2 100% General appearance: alert, cooperative and no distress Head: Normocephalic, without obvious abnormality, atraumatic Eyes: conjunctivae/corneas clear. PERRL, EOM's intact.  Ears: normal TM's and external ear canals both ears Nose: clear discharge, turbinates normal, no sinus tenderness Throat: lips, mucosa, and tongue normal; teeth and gums normal Neck: no adenopathy, no carotid bruit, no JVD, supple, symmetrical, trachea midline and thyroid not enlarged, symmetric, no tenderness/mass/nodules Lungs: clear to auscultation bilaterally Heart: regular rate and rhythm, S1, S2 normal, no murmur, click, rub or gallop Abdomen: mild TTP diffusely w/o masses, rebound or guarding  Extremities: extremities normal, atraumatic, no cyanosis or edema Skin: warm and dry       Assessment & Plan:

## 2012-12-16 NOTE — Assessment & Plan Note (Signed)
A: s/s consistent with flu. No evidence of pneumonia. Fist 48-72 hrs of illness.  P: Please start tamiflu, one pill twice daily for 5 days.  Take Zofran if you feel nauseated by the tamiflu.   Also  please do the following: 1. Take in plenty of fluids. Hot tea, soup etc will help open your nasal passages. 2. Mucinex/robitussin (guaifenessin) to break up chest congestion.  3. Alternate tylenol (acetominophen) and ibuprofen (motrin) every 4 hrs as needed for fever and pain. 4. Nasal saline for stuffy nose.   Call and schedule a follow if you develop chest pain, shortness of breath, persistent high fever temp > 102 .

## 2012-12-30 ENCOUNTER — Other Ambulatory Visit: Payer: Self-pay | Admitting: Sports Medicine

## 2012-12-30 DIAGNOSIS — J111 Influenza due to unidentified influenza virus with other respiratory manifestations: Secondary | ICD-10-CM

## 2012-12-30 MED ORDER — IBUPROFEN 600 MG PO TABS: 600.0000 mg | ORAL_TABLET | Freq: Four times a day (QID) | ORAL | Status: DC | PRN

## 2013-01-16 ENCOUNTER — Emergency Department (HOSPITAL_COMMUNITY)
Admission: EM | Admit: 2013-01-16 | Discharge: 2013-01-16 | Disposition: A | Discharge status: Home / Self Care | Payer: Medicaid Other | Attending: Emergency Medicine | Admitting: Emergency Medicine

## 2013-01-16 ENCOUNTER — Encounter (HOSPITAL_COMMUNITY): Payer: Self-pay | Admitting: Emergency Medicine

## 2013-01-16 ENCOUNTER — Emergency Department (HOSPITAL_COMMUNITY): Payer: Medicaid Other

## 2013-01-16 DIAGNOSIS — F329 Major depressive disorder, single episode, unspecified: Secondary | ICD-10-CM | POA: Insufficient documentation

## 2013-01-16 DIAGNOSIS — Z79899 Other long term (current) drug therapy: Secondary | ICD-10-CM | POA: Insufficient documentation

## 2013-01-16 DIAGNOSIS — F411 Generalized anxiety disorder: Secondary | ICD-10-CM | POA: Insufficient documentation

## 2013-01-16 DIAGNOSIS — I10 Essential (primary) hypertension: Secondary | ICD-10-CM | POA: Insufficient documentation

## 2013-01-16 DIAGNOSIS — IMO0002 Reserved for concepts with insufficient information to code with codable children: Secondary | ICD-10-CM | POA: Insufficient documentation

## 2013-01-16 DIAGNOSIS — G43909 Migraine, unspecified, not intractable, without status migrainosus: Secondary | ICD-10-CM | POA: Insufficient documentation

## 2013-01-16 DIAGNOSIS — F3289 Other specified depressive episodes: Secondary | ICD-10-CM | POA: Insufficient documentation

## 2013-01-16 DIAGNOSIS — S8391XA Sprain of unspecified site of right knee, initial encounter: Secondary | ICD-10-CM

## 2013-01-16 DIAGNOSIS — Y9301 Activity, walking, marching and hiking: Secondary | ICD-10-CM | POA: Insufficient documentation

## 2013-01-16 DIAGNOSIS — Y929 Unspecified place or not applicable: Secondary | ICD-10-CM | POA: Insufficient documentation

## 2013-01-16 DIAGNOSIS — X500XXA Overexertion from strenuous movement or load, initial encounter: Secondary | ICD-10-CM | POA: Insufficient documentation

## 2013-01-16 DIAGNOSIS — F17200 Nicotine dependence, unspecified, uncomplicated: Secondary | ICD-10-CM | POA: Insufficient documentation

## 2013-01-16 MED ORDER — IBUPROFEN 800 MG PO TABS: 800.0000 mg | ORAL_TABLET | Freq: Three times a day (TID) | ORAL | Status: DC

## 2013-01-16 MED ORDER — METHOCARBAMOL 500 MG PO TABS: 500.0000 mg | ORAL_TABLET | Freq: Two times a day (BID) | ORAL | Status: DC

## 2013-01-16 NOTE — ED Provider Notes (Signed)
CSN: [id number]     Arrival date & time 01/16/13  1238 History  This chart was scribed for non-physician practitioner Fayrene Helper, PA-C working with Dagmar Hait, MD by Leone Payor, ED Scribe. This patient was seen in room TR06C/TR06C and the patient's care was started at 1238.    Chief Complaint  Patient presents with  . Knee Injury    The history is provided by the patient. No language interpreter was used.    HPI Comments: Kristin [name] is a 37 y.o. female who presents to the Emergency Department complaining of a right knee injury that occurred yesterday. Pt states she was walking when she twisted it while stepping. She now complains of constant, unchanged right knee pain. She reports falling to the ground but denies head injury or LOC. Pt states pain is worse with standing or walking. She denies numbness or weakness.    Past Medical History  Diagnosis Date  . Depression   . Anxiety   . Migraine headache   . Hypertension    History reviewed. No pertinent past surgical history. Family History  Problem Relation Age of Onset  . Cancer Maternal Grandmother     Breast  . Hypertension Mother   . Hypertension Brother   . Cancer Maternal Uncle     colon   History  Substance Use Topics  . Smoking status: Current Every Day Smoker -- 0.25 packs/day for 5 years  . Smokeless tobacco: Not on file  . Alcohol Use: Yes     Comment: occassionally    OB History   Grav Para Term Preterm Abortions TAB SAB Ect Mult Living   4 3 3  1 1    3      Review of Systems  Musculoskeletal: Positive for arthralgias (right knee pain). Negative for back pain and neck pain.  Neurological: Negative for weakness and numbness.    Allergies  Sumatriptan  Home Medications   Current Outpatient Rx  Name  Route  Sig  Dispense  Refill  . cetirizine (ZYRTEC) 10 MG tablet   Oral   Take 1 tablet (10 mg total) by mouth daily.   30 tablet   11   . clonazePAM (KLONOPIN) 0.5 MG tablet   Oral    Take 1 tablet (0.5 mg total) by mouth 2 (two) times daily as needed for anxiety.   60 tablet   1   . divalproex (DEPAKOTE ER) 500 MG 24 hr tablet   Oral   Take 500 mg by mouth daily.         Marland Kitchen ibuprofen (ADVIL,MOTRIN) 600 MG tablet   Oral   Take 1 tablet (600 mg total) by mouth every 6 (six) hours as needed for pain or fever. DO NOT TAKE IF STILL TAKING MOBIC   30 tablet   0   . lisinopril-hydrochlorothiazide (PRINZIDE) 10-12.5 MG per tablet   Oral   Take 1 tablet by mouth daily.   30 tablet   3   . meloxicam (MOBIC) 15 MG tablet   Oral   Take 1 tablet (15 mg total) by mouth daily.   30 tablet   2   . ondansetron (ZOFRAN ODT) 8 MG disintegrating tablet   Oral   Take 1 tablet (8 mg total) by mouth every 8 (eight) hours as needed for nausea.   20 tablet   0    BP 137/88  Pulse 81  Temp(Src) 98.5 F (36.9 C) (Oral)  Resp 20  Wt 205  lb (92.987 kg)  SpO2 100%  LMP 01/02/2013 Physical Exam  Nursing note and vitals reviewed. Constitutional: She is oriented to person, place, and time. She appears well-developed and well-nourished.  HENT:  Head: Normocephalic and atraumatic.  Cardiovascular: Normal rate and intact distal pulses.   Pulmonary/Chest: Effort normal.  Abdominal: She exhibits no distension.  Musculoskeletal:       Right hip: Normal.       Right knee: She exhibits no ecchymosis and no deformity. Tenderness found. Medial joint line and lateral joint line tenderness noted.       Right ankle: Normal.  Right knee moderate tenderness to palpation to medial and lateral aspects. Mild edema noted. Tenderness with varus and valgus maneuvers. No obvious joint laxity. Negative anterior and posterior drawer test. No deformity, bruising, or bleeding.   Normal right hip and right ankle.   Neurological: She is alert and oriented to person, place, and time.  Skin: Skin is warm and dry.  Psychiatric: She has a normal mood and affect.    ED Course   Procedures  DIAGNOSTIC STUDIES: Oxygen Saturation is 100% on RA, normal by my interpretation.    COORDINATION OF CARE: 1:37 PM Advised pt to follow up with orthopedist. Discussed treatment plan with pt at bedside and pt agreed to plan.   Labs Review Labs Reviewed - No data to display Imaging Review Dg Knee Complete 4 Views Right  01/16/2013   CLINICAL DATA:  Medial right knee pain.  EXAM: RIGHT KNEE - COMPLETE 4+ VIEW  COMPARISON:  10/27/2009.  FINDINGS: Very mild medial compartment osteoarthritis is present with tiny marginal osteophytes. The medial joint space is preserved. There is no fracture. The alignment of the knee is anatomic. Mild patellofemoral osteoarthritis is present. Probable small knee effusion. Unchanged patellar enthesopathy.  IMPRESSION: No acute osseous abnormality. Mild medial and patellofemoral osteoarthritis. Probable small effusion.   Electronically Signed   By: Andreas Newport M.D.   On: 01/16/2013 13:15    EKG Interpretation   None       MDM   1. Right knee sprain, initial encounter    BP 137/88  Pulse 81  Temp(Src) 98.5 F (36.9 C) (Oral)  Resp 20  Wt 205 lb (92.987 kg)  SpO2 100%  LMP 01/02/2013  I have reviewed nursing notes and vital signs. I personally reviewed the imaging tests through PACS system  I reviewed available ER/hospitalization records thought the EMR   I personally performed the services described in this documentation, which was scribed in my presence. The recorded information has been reviewed and is accurate.    Fayrene Helper, PA-C 01/16/13 1353

## 2013-01-16 NOTE — ED Notes (Signed)
Pt in c/o right knee pain after twisting it yesterday, increased pain with walking, pt ambulatory to room

## 2013-01-16 NOTE — ED Notes (Addendum)
Pt offered ace wrap because per ortho tech we do not have a knee sleeve that will fit pt. Pt states she has an immobilizer from a friend she will wear. Fayrene Helper- PA-c aware.

## 2013-01-16 NOTE — ED Provider Notes (Signed)
Medical screening examination/treatment/procedure(s) were performed by non-physician practitioner and as supervising physician I was immediately available for consultation/collaboration.  EKG Interpretation   None         William Less Woolsey, MD 01/16/13 1541 

## 2013-05-15 ENCOUNTER — Other Ambulatory Visit: Payer: Self-pay | Admitting: *Deleted

## 2013-05-15 MED ORDER — LISINOPRIL-HYDROCHLOROTHIAZIDE 10-12.5 MG PO TABS: 1.0000 | ORAL_TABLET | Freq: Every day | ORAL | Status: DC

## 2013-05-19 ENCOUNTER — Other Ambulatory Visit: Payer: Self-pay | Admitting: *Deleted

## 2013-05-19 MED ORDER — DIVALPROEX SODIUM ER 500 MG PO TB24: 500.0000 mg | ORAL_TABLET | Freq: Every day | ORAL | Status: DC

## 2013-05-21 ENCOUNTER — Telehealth: Payer: Self-pay | Admitting: Sports Medicine

## 2013-05-21 NOTE — Telephone Encounter (Signed)
Wants klonapan refilled [facility] on [address]

## 2013-05-22 NOTE — Telephone Encounter (Signed)
Needs appointment for controlled substance refills

## 2013-05-22 NOTE — Telephone Encounter (Signed)
Relayed message patient voiced understanding.Jomar Denz S  

## 2013-06-09 ENCOUNTER — Ambulatory Visit: Payer: Medicaid Other | Admitting: Sports Medicine

## 2013-06-23 ENCOUNTER — Ambulatory Visit (INDEPENDENT_AMBULATORY_CARE_PROVIDER_SITE_OTHER): Payer: Medicaid Other | Admitting: Sports Medicine

## 2013-06-23 ENCOUNTER — Encounter: Payer: Self-pay | Admitting: Sports Medicine

## 2013-06-23 VITALS — BP 148/92 | HR 77 | Temp 98.5°F | Ht 64.5 in | Wt 195.0 lb

## 2013-06-23 DIAGNOSIS — F17200 Nicotine dependence, unspecified, uncomplicated: Secondary | ICD-10-CM

## 2013-06-23 DIAGNOSIS — I10 Essential (primary) hypertension: Secondary | ICD-10-CM

## 2013-06-23 DIAGNOSIS — F319 Bipolar disorder, unspecified: Secondary | ICD-10-CM

## 2013-06-23 DIAGNOSIS — J309 Allergic rhinitis, unspecified: Secondary | ICD-10-CM

## 2013-06-23 DIAGNOSIS — B353 Tinea pedis: Secondary | ICD-10-CM

## 2013-06-23 MED ORDER — CLOTRIMAZOLE 1 % EX CREA: 1.0000 "application " | TOPICAL_CREAM | Freq: Two times a day (BID) | CUTANEOUS | Status: DC

## 2013-06-23 MED ORDER — LURASIDONE HCL 40 MG PO TABS
40.0000 mg | ORAL_TABLET | Freq: Every day | ORAL | Status: DC
Start: 2013-06-23 — End: 2013-10-06

## 2013-06-23 MED ORDER — CETIRIZINE HCL 10 MG PO TABS: 10.0000 mg | ORAL_TABLET | Freq: Every day | ORAL | Status: DC

## 2013-06-23 MED ORDER — FLUTICASONE PROPIONATE 50 MCG/ACT NA SUSP: 2.0000 | Freq: Every day | NASAL | Status: DC

## 2013-06-23 MED ORDER — LISINOPRIL-HYDROCHLOROTHIAZIDE 10-12.5 MG PO TABS: 1.0000 | ORAL_TABLET | Freq: Every day | ORAL | Status: DC

## 2013-06-23 MED ORDER — CLONAZEPAM 0.5 MG PO TABS: 0.5000 mg | ORAL_TABLET | Freq: Two times a day (BID) | ORAL | Status: DC | PRN

## 2013-06-23 NOTE — Assessment & Plan Note (Signed)
Patient reports being quit, suspect some continued secondhand exposure

## 2013-06-23 NOTE — Assessment & Plan Note (Signed)
Problem Based Documentation:    Subjective Report:  Patient reports a 3 week onset of increased nasal congestion, stuffiness, rhinorrhea.  Typically occurs with season change his  Pt denies any fevers, chills, or rigors.     Assessment & Plan & Follow up Issues:  Subacute, recurrent condition 1. Symptomatic treatment with Flonase and Zyrtec

## 2013-06-23 NOTE — Assessment & Plan Note (Signed)
Problem Based Documentation:    Subjective Report:  Patient reports being seen a [facility] and medication was changed from Depakote to another medication but she never had this filled.  She has been lost to followup due to lack of insurance but she has regained her Medicaid.  No evidence of SI/HI or significant troubles at this time.  Patient recognizes the problems she has had the past and does feel like medication is appropriate for her and is willing to followup with [facility] since she has been established there previously     Assessment & Plan & Follow up Issues:  Chronic, untreated condition - Previously evaluated in the disorder clinic and is felt best at this time for her to be under the care of a psychiatrist due to her lack of remorse - Given the above will initiate therapy the patient is to be seen at [facility] (further prescriptions need to come from her psychiatrist) 1. Trial of Latuda given previous intolerance to Depakote and Abilify 2. Refill for Klonopin > Follow up to insure psychiatric treatment has been initiated .

## 2013-06-23 NOTE — Progress Notes (Signed)
Kristin [name] - [age] y.o. female MRN [id number]  Date of birth: September 04, 1975  SUBJECTIVE:     CC: Medication Refill See problem based charting for additional subjective (including HPI, Interval History & ROS)   HISTORY:  Recent Labs  09/04/12 1455  TSH 0.501  } Wt Readings from Last 3 Encounters:  06/23/13 195 lb (88.451 kg)  01/16/13 205 lb (92.987 kg)  12/16/12 205 lb (92.987 kg)   BP Readings from Last 3 Encounters:  06/23/13 148/92  01/16/13 102/69  12/16/12 142/82    History  Smoking status  . Current Every Day Smoker -- 0.25 packs/day for 5 years  Smokeless tobacco  . Not on file   There are no preventive care reminders to display for this patient.  Otherwise past Medical, Surgical, Social, and Family History Reviewed per EMR Medications and Allergies reviewed and updated per below.  VITALS: BP 148/92  Pulse 77  Temp(Src) 98.5 F (36.9 C) (Oral)  Ht 5' 4.5" (1.638 m)  Wt 195 lb (88.451 kg)  BMI 32.97 kg/m2  LMP 06/02/2013  PHYSICAL EXAM: GENERAL:  the [place] female female. In no discomfort; no respiratory distress  PSYCH: alert and appropriate, good insight  Mood is euthymic.    HNEENT:  no JVD, no thyromegaly   CARDIO: RRR, S1/S2 heard, no murmur  LUNGS: CTA B, no wheezes, no crackles  ABDOMEN:   EXTREM:  Warm, well perfused.  Moves all 4 extremities spontaneously; no lateralization.   Pedal pulses 2/4.  0 pretibial edema.  GU:   SKIN:  flaking slightly erythematous rash between interdigital areas on left foot and on plantar aspect of left foot    MEDICATIONS, LABS & OTHER ORDERS: Previous Medications   No medications on file   Modified Medications   Modified Medication Previous Medication   CETIRIZINE (ZYRTEC) 10 MG TABLET cetirizine (ZYRTEC) 10 MG tablet      Take 1 tablet (10 mg total) by mouth daily.    Take 1 tablet (10 mg total) by mouth daily.   CLONAZEPAM (KLONOPIN) 0.5 MG TABLET clonazePAM (KLONOPIN) 0.5 MG tablet      Take 1 tablet  (0.5 mg total) by mouth 2 (two) times daily as needed for anxiety.    Take 1 tablet (0.5 mg total) by mouth 2 (two) times daily as needed for anxiety.   LISINOPRIL-HYDROCHLOROTHIAZIDE (PRINZIDE) 10-12.5 MG PER TABLET lisinopril-hydrochlorothiazide (PRINZIDE) 10-12.5 MG per tablet      Take 1 tablet by mouth daily.    Take 1 tablet by mouth daily.   New Prescriptions   CLOTRIMAZOLE (LOTRIMIN) 1 % CREAM    Apply 1 application topically 2 (two) times daily.   FLUTICASONE (FLONASE) 50 MCG/ACT NASAL SPRAY    Place 2 sprays into both nostrils daily.   LURASIDONE (LATUDA) 40 MG TABS TABLET    Take 1 tablet (40 mg total) by mouth daily with breakfast.   Discontinued Medications   DIVALPROEX (DEPAKOTE ER) 500 MG 24 HR TABLET    Take 1 tablet (500 mg total) by mouth daily.   IBUPROFEN (ADVIL,MOTRIN) 600 MG TABLET    Take 1 tablet (600 mg total) by mouth every 6 (six) hours as needed for pain or fever. DO NOT TAKE IF STILL TAKING MOBIC   IBUPROFEN (ADVIL,MOTRIN) 800 MG TABLET    Take 1 tablet (800 mg total) by mouth 3 (three) times daily.   METHOCARBAMOL (ROBAXIN) 500 MG TABLET    Take 1 tablet (500 mg total) by mouth 2 (  two) times daily.   ONDANSETRON (ZOFRAN ODT) 8 MG DISINTEGRATING TABLET    Take 1 tablet (8 mg total) by mouth every 8 (eight) hours as needed for nausea.  No orders of the defined types were placed in this encounter.   ASSESSMENT & PLAN: See problem based charting & AVS for pt instructions.

## 2013-06-23 NOTE — Progress Notes (Signed)
Opened in error

## 2013-06-23 NOTE — Patient Instructions (Signed)
It was to see today. You need to follow up with [facility] to continue your care of your bipolar disorder there. I have sent in prescriptions for Zyrtec and Flonase for your allergies. Please use the Lotrimin cream twice per day for your athlete's foot. Your medication list should be up-to-date.  Please follow up in 2-3 months to check to make sure your blood pressure is improved

## 2013-06-23 NOTE — Assessment & Plan Note (Signed)
Problem Based Documentation:    Subjective Report:  Pt denies chest pain, dyspnea at rest or exertion, PND, lower extremity edema.  Patient denies any facial asymmetry, unilateral weakness, or dysarthria.  Patient has been out of medications for 2-3 days.  Overall low activity level.  Nutrition is poor with highly processed choices     Assessment & Plan & Follow up Issues:  Chronic, untreated condition 1. Refill for Lisinopril/HCTZ > Followup BP check in one month   .

## 2013-06-23 NOTE — Assessment & Plan Note (Signed)
Problem Based Documentation:    Subjective Report:   left foot with itching, pain and rash between toes and on medial aspect of foot     Assessment & Plan & Follow up Issues:  Acute condition 1. Start Lotrimin for likely athlete's foot. > Consider noninfectious (potential inflammatory/autoimmune) etiology given prior episodes of cutaneous manifestations on legs and arms that are now resolved.    Marland Kitchen

## 2013-10-02 ENCOUNTER — Encounter: Payer: Self-pay | Admitting: Family Medicine

## 2013-10-02 ENCOUNTER — Ambulatory Visit (INDEPENDENT_AMBULATORY_CARE_PROVIDER_SITE_OTHER): Payer: Medicaid Other | Admitting: Family Medicine

## 2013-10-02 VITALS — BP 151/94 | HR 71 | Temp 98.2°F | Ht 64.5 in | Wt 196.8 lb

## 2013-10-02 DIAGNOSIS — I10 Essential (primary) hypertension: Secondary | ICD-10-CM

## 2013-10-02 DIAGNOSIS — B353 Tinea pedis: Secondary | ICD-10-CM

## 2013-10-02 DIAGNOSIS — J988 Other specified respiratory disorders: Secondary | ICD-10-CM

## 2013-10-02 DIAGNOSIS — J22 Unspecified acute lower respiratory infection: Secondary | ICD-10-CM

## 2013-10-02 MED ORDER — CLOTRIMAZOLE-BETAMETHASONE 1-0.05 % EX CREA: 1.0000 "application " | TOPICAL_CREAM | Freq: Two times a day (BID) | CUTANEOUS | Status: DC

## 2013-10-02 MED ORDER — LISINOPRIL-HYDROCHLOROTHIAZIDE 10-12.5 MG PO TABS: 1.0000 | ORAL_TABLET | Freq: Every day | ORAL | Status: DC

## 2013-10-02 MED ORDER — AMOXICILLIN-POT CLAVULANATE ER 1000-62.5 MG PO TB12: 2.0000 | ORAL_TABLET | Freq: Two times a day (BID) | ORAL | Status: DC

## 2013-10-02 MED ORDER — HYDROCODONE-HOMATROPINE 5-1.5 MG/5ML PO SYRP: 5.0000 mL | ORAL_SOLUTION | Freq: Three times a day (TID) | ORAL | Status: DC | PRN

## 2013-10-02 NOTE — Assessment & Plan Note (Signed)
Medication refilled today

## 2013-10-02 NOTE — Assessment & Plan Note (Signed)
Given symptoms and physical exam findings there is concern for underlying PNA.  Additionally, L TM erythematous and consistent with Otitis. Will treat with empiric Augmentin.  Treating cough with Hycodan. Follow up if she fails to improve or worsens.

## 2013-10-02 NOTE — Patient Instructions (Signed)
It was nice to see you today.  Below is what we have discussed.  Cold - It appears that you likely have an underlying bacterial illness - I have prescribed an antibiotic for you. - I have also given you something for cough.  Foot itching/lesion - I have prescribed a new topical cream for the affected areas.  Follow up as needed.

## 2013-10-02 NOTE — Progress Notes (Signed)
   Subjective:    Patient ID: Kristin [name], female    DOB: September 15, 1975, 38 y.o.   MRN: [id number]  HPI 38 year female with hx of HTN and tobacco abuse presents for a same day appointment with complaints of cough/cold.  She also has 2 other concerns today.  1) Cough/Cold - She reports productive cough x 2 weeks. - Associated symptoms - tactile fever, sweats, chills, body aches, chest pain from cough, SOB/wheezing. - She has tried several OTC treatments/medications: Robitussin, Coricidin, Nyquil, Dayquil, Cough syrups, Mucinex.  No exacerbating factors  - She continues to have symptoms and is particularly trouble by the cough.  2) HTN - In need of medication refill  3) Rash - Left foot - Patient reports that she has been seen for this before and was prescribed Lotrimin. - She has not had any relief and continues to have "rash" despite treatment. - Area is itchy and she often scratches it. - She reports that it is exacerbated by moisture.  Review of Systems Per HPI    Objective:   Physical Exam Filed Vitals:   10/02/13 1527  BP: 151/94  Pulse: 71  Temp: 98.2 F (36.8 C)   Exam: General: well appearing female in NAD. HEENT:  - Oropharynx mildly erythematous; no exudate. - Left TM erythematous. Right TM normal.  Cardiovascular: RRR. No murmurs, rubs, or gallops. Respiratory: Diffuse wheezing bilaterally in all lung fields. Skin: Left foot - erythematous, macerated areas on the medial and lateral plantar surface of foot.  Excoriation noted.  Additional macerated areas noted between digits.     Assessment & Plan:  See Problem List

## 2013-10-02 NOTE — Assessment & Plan Note (Signed)
Will treat with topical Lotrisone.

## 2013-10-06 ENCOUNTER — Other Ambulatory Visit: Payer: Self-pay | Admitting: *Deleted

## 2013-10-06 DIAGNOSIS — F319 Bipolar disorder, unspecified: Secondary | ICD-10-CM

## 2013-10-07 MED ORDER — LURASIDONE HCL 40 MG PO TABS: 40.0000 mg | ORAL_TABLET | Freq: Every day | ORAL | Status: DC

## 2013-10-07 NOTE — Telephone Encounter (Signed)
Relayed message,patient voiced understanding. Kristin [name]  

## 2013-10-07 NOTE — Telephone Encounter (Signed)
This patient needs to be receiving care at [facility], per clinic notes by previous PCP. I have refilled latuda to bridge her to care there, but she will need to receive future prescriptions from them or at the least make an appointment to address this problem with me.

## 2013-11-12 ENCOUNTER — Ambulatory Visit (INDEPENDENT_AMBULATORY_CARE_PROVIDER_SITE_OTHER): Payer: Medicaid Other | Admitting: *Deleted

## 2013-11-12 DIAGNOSIS — Z111 Encounter for screening for respiratory tuberculosis: Secondary | ICD-10-CM

## 2013-11-12 NOTE — Progress Notes (Signed)
   Pt in nurse clinic for PPD placement.  Pt advised to return in 48-72 hours for reading.  Clovis Pu, RN

## 2013-11-14 ENCOUNTER — Encounter: Payer: Self-pay | Admitting: *Deleted

## 2013-11-14 ENCOUNTER — Ambulatory Visit (INDEPENDENT_AMBULATORY_CARE_PROVIDER_SITE_OTHER): Payer: Medicaid Other | Admitting: *Deleted

## 2013-11-14 DIAGNOSIS — Z111 Encounter for screening for respiratory tuberculosis: Secondary | ICD-10-CM

## 2013-11-14 LAB — TB SKIN TEST
INDURATION: 0 mm
TB SKIN TEST: NEGATIVE

## 2013-11-14 NOTE — Progress Notes (Signed)
   PPD Reading Note PPD read and results entered in EpicCare. Result: 0 mm induration. Interpretation: Negative Harris Kistler L, RN  

## 2014-01-12 ENCOUNTER — Encounter: Payer: Self-pay | Admitting: Family Medicine

## 2014-01-12 ENCOUNTER — Other Ambulatory Visit: Payer: Self-pay | Admitting: *Deleted

## 2014-01-12 DIAGNOSIS — F319 Bipolar disorder, unspecified: Secondary | ICD-10-CM

## 2014-02-16 ENCOUNTER — Ambulatory Visit: Payer: Medicaid Other | Admitting: Family Medicine

## 2014-02-23 ENCOUNTER — Other Ambulatory Visit: Payer: Self-pay | Admitting: Family Medicine

## 2014-02-23 ENCOUNTER — Ambulatory Visit (INDEPENDENT_AMBULATORY_CARE_PROVIDER_SITE_OTHER): Payer: Medicaid Other | Admitting: Family Medicine

## 2014-02-23 ENCOUNTER — Encounter: Payer: Self-pay | Admitting: Family Medicine

## 2014-02-23 VITALS — BP 141/82 | HR 84 | Temp 99.4°F | Ht 64.5 in | Wt 196.0 lb

## 2014-02-23 DIAGNOSIS — J020 Streptococcal pharyngitis: Secondary | ICD-10-CM

## 2014-02-23 DIAGNOSIS — J029 Acute pharyngitis, unspecified: Secondary | ICD-10-CM

## 2014-02-23 LAB — POCT RAPID STREP A (OFFICE): Rapid Strep A Screen: NEGATIVE

## 2014-02-23 MED ORDER — IBUPROFEN 600 MG PO TABS: 600.0000 mg | ORAL_TABLET | Freq: Four times a day (QID) | ORAL | Status: DC | PRN

## 2014-02-23 NOTE — Patient Instructions (Signed)

## 2014-02-23 NOTE — Progress Notes (Signed)
  Subjective:     Kristin [name] is a 38 y.o. female who presents for evaluation of sore throat. Associated symptoms include post nasal drip and sore throat. Onset of symptoms was 3 days ago, and have been unchanged since that time. She is drinking plenty of fluids. She has not had a recent close exposure to someone with proven streptococcal pharyngitis.  Denies fever, does have some LAD, + for cough, + for tonsillar hypertrophy   The following portions of the patient's history were reviewed and updated as appropriate: allergies, current medications, past family history, past medical history, past social history, past surgical history and problem list.  Review of Systems Pertinent items are noted in HPI.    Objective:    BP 141/82 mmHg  Pulse 84  Temp(Src) 99.4 F (37.4 C) (Oral)  Ht 5' 4.5" (1.638 m)  Wt 88.905 kg (196 lb)  BMI 33.14 kg/m2  LMP 02/10/2014 (Approximate) General:  alert, cooperative and appears stated age  Mouth:  + 1-2 hypertrophied tonsils, + hyperemia, no exudates, no vesicular lesion   Neck: no adenopathy.   Laboratory Strep test done. Results:pending      Assessment:     Acute Pharyngitis, likely  Viral pharyngitis    Plan:    Use of OTC analgesics recommended as well as salt water gargles. Patient advised of the risk of peritonsillar abscess formation. Follow up as needed. Ibuprofen 600-800 mg, throat lozenges PRN, tea w/ honey

## 2014-02-23 NOTE — Addendum Note (Signed)
Addended by: Gildardo Cranker R on: 02/23/2014 04:00 PM   Modules accepted: Orders

## 2014-03-30 ENCOUNTER — Ambulatory Visit: Payer: Medicaid Other | Admitting: Family Medicine

## 2014-04-22 ENCOUNTER — Encounter: Payer: Self-pay | Admitting: Family Medicine

## 2014-04-22 ENCOUNTER — Ambulatory Visit (INDEPENDENT_AMBULATORY_CARE_PROVIDER_SITE_OTHER): Payer: Medicaid Other | Admitting: Family Medicine

## 2014-04-22 ENCOUNTER — Other Ambulatory Visit (HOSPITAL_COMMUNITY)
Admission: RE | Admit: 2014-04-22 | Discharge: 2014-04-22 | Disposition: A | Discharge status: Home / Self Care | Payer: Medicaid Other | Source: Ambulatory Visit | Attending: Family Medicine | Admitting: Family Medicine

## 2014-04-22 VITALS — BP 154/97 | HR 75 | Temp 98.2°F | Ht 64.5 in | Wt 197.0 lb

## 2014-04-22 DIAGNOSIS — F319 Bipolar disorder, unspecified: Secondary | ICD-10-CM

## 2014-04-22 DIAGNOSIS — Z720 Tobacco use: Secondary | ICD-10-CM

## 2014-04-22 DIAGNOSIS — A499 Bacterial infection, unspecified: Secondary | ICD-10-CM

## 2014-04-22 DIAGNOSIS — I10 Essential (primary) hypertension: Secondary | ICD-10-CM

## 2014-04-22 DIAGNOSIS — G4700 Insomnia, unspecified: Secondary | ICD-10-CM

## 2014-04-22 DIAGNOSIS — F17200 Nicotine dependence, unspecified, uncomplicated: Secondary | ICD-10-CM

## 2014-04-22 DIAGNOSIS — B3731 Acute candidiasis of vulva and vagina: Secondary | ICD-10-CM | POA: Insufficient documentation

## 2014-04-22 DIAGNOSIS — B9689 Other specified bacterial agents as the cause of diseases classified elsewhere: Secondary | ICD-10-CM | POA: Insufficient documentation

## 2014-04-22 DIAGNOSIS — E669 Obesity, unspecified: Secondary | ICD-10-CM

## 2014-04-22 DIAGNOSIS — R300 Dysuria: Secondary | ICD-10-CM

## 2014-04-22 DIAGNOSIS — Z113 Encounter for screening for infections with a predominantly sexual mode of transmission: Secondary | ICD-10-CM | POA: Insufficient documentation

## 2014-04-22 DIAGNOSIS — N898 Other specified noninflammatory disorders of vagina: Secondary | ICD-10-CM

## 2014-04-22 DIAGNOSIS — B373 Candidiasis of vulva and vagina: Secondary | ICD-10-CM

## 2014-04-22 DIAGNOSIS — N760 Acute vaginitis: Secondary | ICD-10-CM

## 2014-04-22 LAB — POCT WET PREP (WET MOUNT)
CLUE CELLS WET PREP WHIFF POC: POSITIVE
Trichomonas Wet Prep HPF POC: NEGATIVE

## 2014-04-22 LAB — POCT URINALYSIS DIPSTICK
BILIRUBIN UA: NEGATIVE
Blood, UA: NEGATIVE
GLUCOSE UA: NEGATIVE
KETONES UA: 15
Leukocytes, UA: NEGATIVE
Nitrite, UA: NEGATIVE
Protein, UA: NEGATIVE
SPEC GRAV UA: 1.015
Urobilinogen, UA: NEGATIVE
pH, UA: 5.5

## 2014-04-22 NOTE — Assessment & Plan Note (Signed)
Pre-contemplative. Support offered.

## 2014-04-22 NOTE — Patient Instructions (Signed)
We did a lot during this visit, so we need to make sure we're on the same page. What we talked about is:  - Continue taking latuda as directed and take clonazepam only as needed. You should add talk therapy for maximum benefit. I highly recommend [facility] as a way of treating this mood disorder. I also understand wanting all your care at one place. With that in mind, I recommend that you meet with our psychologist, Dr. Spero Geralds, for help dealing with your bipolar disorder/anxiety.  You can schedule an appointment with her by calling her directly at [phone]. - Start taking trazodone at bedtime. Also keep good sleep hygiene, as we discussed. Make sure you have a bedtime routine. - I will call you with the results of your urine test and call in an antibiotic if needed.   Make a follow up appointment with me

## 2014-04-22 NOTE — Assessment & Plan Note (Signed)
+   whiff and clue cells on wet prep. Likely cause of discharge. Few yeast also present.  - Flagyl - Will follow up GC/Chl

## 2014-04-22 NOTE — Assessment & Plan Note (Addendum)
With few yeast on wet prep. Will Rx diflucan, especially in light of concomitant abx.

## 2014-04-22 NOTE — Assessment & Plan Note (Signed)
Historical records reviewed. She denies any recent manic symptoms. Incredibly resistant to talk therapy in general and [facility] in specific. Though I agree that she needs to be followed by a psychiatrist due to the severity of her symptoms and criminal record, I believe the only positive direction to move in will be me personally prescribing her chronic medications.  - Continue latuda - Refill clonazepam - Will discuss case with Dr. Pascal Lux.
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
## 2014-04-22 NOTE — Assessment & Plan Note (Signed)
Discussed sleep hygeine at length. Think control of mood disorder will improve this symptom.  - Start trazodone qHS

## 2014-04-23 ENCOUNTER — Telehealth: Payer: Self-pay | Admitting: Family Medicine

## 2014-04-23 MED ORDER — METRONIDAZOLE 500 MG PO TABS: 500.0000 mg | ORAL_TABLET | Freq: Two times a day (BID) | ORAL | Status: DC

## 2014-04-23 MED ORDER — CLONAZEPAM 0.5 MG PO TABS: 0.5000 mg | ORAL_TABLET | Freq: Two times a day (BID) | ORAL | Status: DC | PRN

## 2014-04-23 MED ORDER — TRAZODONE HCL 100 MG PO TABS: 100.0000 mg | ORAL_TABLET | Freq: Every day | ORAL | Status: DC

## 2014-04-23 MED ORDER — FLUCONAZOLE 150 MG PO TABS
150.0000 mg | ORAL_TABLET | Freq: Once | ORAL | Status: DC
Start: 2014-04-23 — End: 2014-12-21

## 2014-04-23 MED ORDER — LURASIDONE HCL 40 MG PO TABS: 40.0000 mg | ORAL_TABLET | Freq: Every day | ORAL | Status: DC

## 2014-04-23 NOTE — Assessment & Plan Note (Signed)
At goal after initially above goal today despite reported compliance. Asymptomatic.  - Continue ACE-HCTZ - Noticed need for repeat monitoring labs after pt left. Will discuss at follow up.

## 2014-04-23 NOTE — Telephone Encounter (Signed)
Spoke with patient and informed her of below 

## 2014-04-23 NOTE — Telephone Encounter (Signed)
She is right; with all the other issues covered during this visit I forgot to actually prescribe trazodone. That is my fault. I just prescribed it by EMR.

## 2014-04-23 NOTE — Telephone Encounter (Signed)
Pt called and wants to know when the doctor is going to send in her sleeping pills. She thought he was going to do this yesterday.Please call patient and inform them. jw

## 2014-04-23 NOTE — Telephone Encounter (Signed)
As indicated in office visit documentation from 04/22/2014, pt requires treatment for BV and vulvovaginitis. Urinalysis was completely normal, so no UTI treatment.  - Flagyl and diflucan were sent to the pharmacy along with refills of latuda (by EMR) and klonopin (called in).

## 2014-04-23 NOTE — Telephone Encounter (Signed)
Pt calling back, wants to see if MD is still going to call in a sleep medication? Says the laduda is for her bipolar and the klonopin is for her nerves. Pt may be reached at  [phone]

## 2014-04-24 ENCOUNTER — Telehealth: Payer: Self-pay | Admitting: Family Medicine

## 2014-04-24 ENCOUNTER — Ambulatory Visit (INDEPENDENT_AMBULATORY_CARE_PROVIDER_SITE_OTHER): Payer: Medicaid Other | Admitting: *Deleted

## 2014-04-24 ENCOUNTER — Telehealth: Payer: Self-pay | Admitting: *Deleted

## 2014-04-24 ENCOUNTER — Other Ambulatory Visit: Payer: Self-pay | Admitting: Family Medicine

## 2014-04-24 DIAGNOSIS — A549 Gonococcal infection, unspecified: Secondary | ICD-10-CM

## 2014-04-24 DIAGNOSIS — Q969 Turner's syndrome, unspecified: Secondary | ICD-10-CM

## 2014-04-24 LAB — GC/CHLAMYDIA PROBE AMP (~~LOC~~) NOT AT ARMC
CHLAMYDIA, DNA PROBE: NEGATIVE
NEISSERIA GONORRHEA: POSITIVE — AB

## 2014-04-24 MED ORDER — AZITHROMYCIN 500 MG PO TABS: 1000.0000 mg | ORAL_TABLET | Freq: Once | ORAL | Status: DC

## 2014-04-24 MED ORDER — AZITHROMYCIN 250 MG PO TABS
500.0000 mg | ORAL_TABLET | Freq: Once | ORAL | Status: AC
Start: 2014-04-24 — End: 2014-04-24
  Administered 2014-04-24: 500 mg via ORAL

## 2014-04-24 MED ORDER — CEFTRIAXONE SODIUM 1 G IJ SOLR
250.0000 mg | Freq: Once | INTRAMUSCULAR | Status: AC
  Administered 2014-04-24: 250 mg via INTRAMUSCULAR

## 2014-04-24 NOTE — Telephone Encounter (Signed)
Attempted to call patient to inform her that she will need to go to health department to be treated. She can call Br[facility] at [phone]. Mailbox is full

## 2014-04-24 NOTE — Progress Notes (Signed)
  Pt in nurse clinic for gonorrhea treatment.  Rocephin 250 mg IM given per verbal orders Dr. Jarvis Newcomer; pt brought in Rx for Azithromycin 1gm PO x1.  Pt advised not to have sex x 10-14 days until partner has been tested/treated. Clovis Pu, RN

## 2014-04-24 NOTE — Telephone Encounter (Signed)
Spoke with patient and gave her below message, she is coming today at 2:45pm for appointment for injection

## 2014-04-24 NOTE — Telephone Encounter (Signed)
Spoke with kendall and she will contact [facility] herself

## 2014-04-24 NOTE — Telephone Encounter (Signed)
Gonorrhea positive result noted. Pt needs therapy. Will determine her preference for therapy, the major options for which are: - CTX 250mg  IM x1 is preferred if she has any concomitant extragenital infection, but I'm not sure if she wants to return to the office.  - suprax 400mg  po x1 is $20 at [facility] for cash payers.

## 2014-06-29 ENCOUNTER — Other Ambulatory Visit: Payer: Self-pay | Admitting: *Deleted

## 2014-06-29 MED ORDER — CETIRIZINE HCL 10 MG PO TABS: 10.0000 mg | ORAL_TABLET | Freq: Every day | ORAL | Status: DC

## 2014-12-21 ENCOUNTER — Ambulatory Visit (INDEPENDENT_AMBULATORY_CARE_PROVIDER_SITE_OTHER): Payer: Self-pay | Admitting: *Deleted

## 2014-12-21 ENCOUNTER — Encounter: Payer: Self-pay | Admitting: Family Medicine

## 2014-12-21 ENCOUNTER — Ambulatory Visit (INDEPENDENT_AMBULATORY_CARE_PROVIDER_SITE_OTHER): Payer: Self-pay | Admitting: Family Medicine

## 2014-12-21 VITALS — BP 110/73 | HR 77 | Temp 98.5°F | Wt 194.7 lb

## 2014-12-21 DIAGNOSIS — M25562 Pain in left knee: Secondary | ICD-10-CM

## 2014-12-21 DIAGNOSIS — G4700 Insomnia, unspecified: Secondary | ICD-10-CM

## 2014-12-21 DIAGNOSIS — M25561 Pain in right knee: Secondary | ICD-10-CM

## 2014-12-21 DIAGNOSIS — L309 Dermatitis, unspecified: Secondary | ICD-10-CM

## 2014-12-21 DIAGNOSIS — M1711 Unilateral primary osteoarthritis, right knee: Secondary | ICD-10-CM

## 2014-12-21 DIAGNOSIS — Z111 Encounter for screening for respiratory tuberculosis: Secondary | ICD-10-CM

## 2014-12-21 DIAGNOSIS — F319 Bipolar disorder, unspecified: Secondary | ICD-10-CM

## 2014-12-21 DIAGNOSIS — F17200 Nicotine dependence, unspecified, uncomplicated: Secondary | ICD-10-CM

## 2014-12-21 DIAGNOSIS — I10 Essential (primary) hypertension: Secondary | ICD-10-CM

## 2014-12-21 HISTORY — DX: Unilateral primary osteoarthritis, right knee: M17.11

## 2014-12-21 LAB — CBC
HCT: 38.5 % (ref 36.0–46.0)
Hemoglobin: 12.4 g/dL (ref 12.0–15.0)
MCH: 27.3 pg (ref 26.0–34.0)
MCHC: 32.2 g/dL (ref 30.0–36.0)
MCV: 84.6 fL (ref 78.0–100.0)
MPV: 10.3 fL (ref 8.6–12.4)
PLATELETS: 297 10*3/uL (ref 150–400)
RBC: 4.55 MIL/uL (ref 3.87–5.11)
RDW: 14.7 % (ref 11.5–15.5)
WBC: 4.7 10*3/uL (ref 4.0–10.5)

## 2014-12-21 LAB — BASIC METABOLIC PANEL WITH GFR
BUN: 12 mg/dL (ref 7–25)
CALCIUM: 8.7 mg/dL (ref 8.6–10.2)
CO2: 28 mmol/L (ref 20–31)
Chloride: 108 mmol/L (ref 98–110)
Creat: 0.56 mg/dL (ref 0.50–1.10)
Glucose, Bld: 84 mg/dL (ref 65–99)
Potassium: 3.9 mmol/L (ref 3.5–5.3)
SODIUM: 144 mmol/L (ref 135–146)

## 2014-12-21 MED ORDER — MELOXICAM 7.5 MG PO TABS
7.5000 mg | ORAL_TABLET | Freq: Every day | ORAL | Status: DC
Start: 2014-12-21 — End: 2014-12-21

## 2014-12-21 MED ORDER — MELOXICAM 7.5 MG PO TABS: 7.5000 mg | ORAL_TABLET | Freq: Every day | ORAL | Status: DC

## 2014-12-21 MED ORDER — CLONAZEPAM 0.5 MG PO TABS: 0.5000 mg | ORAL_TABLET | Freq: Two times a day (BID) | ORAL | Status: DC | PRN

## 2014-12-21 MED ORDER — TRIAMCINOLONE ACETONIDE 0.025 % EX CREA: 1.0000 "application " | TOPICAL_CREAM | Freq: Two times a day (BID) | CUTANEOUS | Status: DC

## 2014-12-21 MED ORDER — LISINOPRIL-HYDROCHLOROTHIAZIDE 10-12.5 MG PO TABS: 1.0000 | ORAL_TABLET | Freq: Every day | ORAL | Status: DC

## 2014-12-21 MED ORDER — LURASIDONE HCL 40 MG PO TABS: 40.0000 mg | ORAL_TABLET | Freq: Every day | ORAL | Status: DC

## 2014-12-21 MED ORDER — TRAZODONE HCL 100 MG PO TABS: 100.0000 mg | ORAL_TABLET | Freq: Every day | ORAL | Status: DC
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
## 2014-12-21 NOTE — Progress Notes (Signed)
   PPD placed Left Forearm.  Pt to return 12/23/14 for reading.  Pt tolerated intradermal injection. Clovis Pu, RN

## 2014-12-21 NOTE — Assessment & Plan Note (Signed)
Reports trazodone at 100mg  is working well. Will continue this along with other psychotropics. Warned that ADD medication would exacerbate this.

## 2014-12-21 NOTE — Assessment & Plan Note (Signed)
Flare likely due to detergents in prison. Will Rx topical steroid cream for large area coverage until flare subsides with continued lifestyle modifications.

## 2014-12-21 NOTE — Assessment & Plan Note (Signed)
R knee XR 2014 showed very mild medial compartment OA with osteophytes and preserved joint space. Will trial NSAIDs in addition to continued activity and topical therapy.

## 2014-12-21 NOTE — Assessment & Plan Note (Signed)
Significant anti-social behaviors as well. Reports that she will seek psychiatric care at [facility]. I will continue latuda and klonopin until that visit. Warned of risks of multiple CNS depressants. Follow up 6 months.

## 2014-12-21 NOTE — Patient Instructions (Signed)
Thank you for coming in today!  We are checking some labs today, and we'll call you if they are abnormal. If you do not hear from me by phone or letter in 2 weeks, please call us as we may have been unable to reach you.   - Consider quitting smoking - Use klonopin as little as possible as this is a risky medication to be on.  - If you plan to become pregnant please let me know because we will need to change your medications.   Our clinic's number is [phone]. Feel free to call any time with questions or concerns. We will answer any questions after hours with our 24-hour emergency line at that number as well.   - Dr. Jarvis Newcomer

## 2014-12-21 NOTE — Assessment & Plan Note (Addendum)
Well controlled, continue current medications. Check labs today.

## 2014-12-21 NOTE — Assessment & Plan Note (Signed)
Declines intervention. Gave business card with 1-800 quit line. Follow up at next office visit.

## 2014-12-23 ENCOUNTER — Ambulatory Visit (INDEPENDENT_AMBULATORY_CARE_PROVIDER_SITE_OTHER): Payer: Self-pay | Admitting: *Deleted

## 2014-12-23 ENCOUNTER — Encounter: Payer: Self-pay | Admitting: *Deleted

## 2014-12-23 DIAGNOSIS — Z7689 Persons encountering health services in other specified circumstances: Secondary | ICD-10-CM

## 2014-12-23 DIAGNOSIS — Z111 Encounter for screening for respiratory tuberculosis: Secondary | ICD-10-CM

## 2014-12-23 LAB — TB SKIN TEST
INDURATION: 0 mm
TB Skin Test: NEGATIVE

## 2014-12-23 NOTE — Progress Notes (Signed)
   PPD Reading Note PPD read and results entered in EpicCare. Result: 0 mm induration. Interpretation: Negative If test not read within 48-72 hours of initial placement, patient advised to repeat in other arm 1-3 weeks after this test. Allergic reaction: no  Gabbi Whetstone L, RN  

## 2015-06-17 ENCOUNTER — Emergency Department (HOSPITAL_COMMUNITY): Admission: EM | Admit: 2015-06-17 | Discharge: 2015-06-17 | Discharge status: Against Medical Advice | Payer: Self-pay

## 2015-06-17 NOTE — ED Notes (Signed)
No answer when pt's name called in the waiting room; unable to locate pt; pt eloped from the waiting room prior to triage

## 2015-06-17 NOTE — ED Notes (Signed)
No answer when pt's name called for triage  

## 2015-06-17 NOTE — ED Notes (Signed)
No answer when pt's name called in the waiting room 

## 2015-06-18 ENCOUNTER — Emergency Department (HOSPITAL_COMMUNITY): Payer: No Typology Code available for payment source

## 2015-06-18 ENCOUNTER — Emergency Department (HOSPITAL_COMMUNITY)
Admission: EM | Admit: 2015-06-18 | Discharge: 2015-06-18 | Disposition: A | Discharge status: Home / Self Care | Payer: No Typology Code available for payment source | Attending: Emergency Medicine | Admitting: Emergency Medicine

## 2015-06-18 DIAGNOSIS — I10 Essential (primary) hypertension: Secondary | ICD-10-CM | POA: Insufficient documentation

## 2015-06-18 DIAGNOSIS — Y998 Other external cause status: Secondary | ICD-10-CM | POA: Diagnosis not present

## 2015-06-18 DIAGNOSIS — S161XXA Strain of muscle, fascia and tendon at neck level, initial encounter: Secondary | ICD-10-CM | POA: Diagnosis not present

## 2015-06-18 DIAGNOSIS — S39012A Strain of muscle, fascia and tendon of lower back, initial encounter: Secondary | ICD-10-CM | POA: Insufficient documentation

## 2015-06-18 DIAGNOSIS — F17200 Nicotine dependence, unspecified, uncomplicated: Secondary | ICD-10-CM | POA: Insufficient documentation

## 2015-06-18 DIAGNOSIS — S4992XA Unspecified injury of left shoulder and upper arm, initial encounter: Secondary | ICD-10-CM | POA: Diagnosis not present

## 2015-06-18 DIAGNOSIS — Z791 Long term (current) use of non-steroidal anti-inflammatories (NSAID): Secondary | ICD-10-CM | POA: Diagnosis not present

## 2015-06-18 DIAGNOSIS — Y9389 Activity, other specified: Secondary | ICD-10-CM | POA: Diagnosis not present

## 2015-06-18 DIAGNOSIS — F419 Anxiety disorder, unspecified: Secondary | ICD-10-CM | POA: Diagnosis not present

## 2015-06-18 DIAGNOSIS — F329 Major depressive disorder, single episode, unspecified: Secondary | ICD-10-CM | POA: Diagnosis not present

## 2015-06-18 DIAGNOSIS — Z79899 Other long term (current) drug therapy: Secondary | ICD-10-CM | POA: Diagnosis not present

## 2015-06-18 DIAGNOSIS — G43909 Migraine, unspecified, not intractable, without status migrainosus: Secondary | ICD-10-CM | POA: Insufficient documentation

## 2015-06-18 DIAGNOSIS — S4991XA Unspecified injury of right shoulder and upper arm, initial encounter: Secondary | ICD-10-CM | POA: Insufficient documentation

## 2015-06-18 DIAGNOSIS — Y92410 Unspecified street and highway as the place of occurrence of the external cause: Secondary | ICD-10-CM | POA: Diagnosis not present

## 2015-06-18 DIAGNOSIS — Z7952 Long term (current) use of systemic steroids: Secondary | ICD-10-CM | POA: Insufficient documentation

## 2015-06-18 DIAGNOSIS — S199XXA Unspecified injury of neck, initial encounter: Secondary | ICD-10-CM | POA: Diagnosis present

## 2015-06-18 MED ORDER — IBUPROFEN 800 MG PO TABS
800.0000 mg | ORAL_TABLET | Freq: Once | ORAL | Status: AC
  Administered 2015-06-18: 800 mg via ORAL
  Filled 2015-06-18: qty 1

## 2015-06-18 MED ORDER — TRAMADOL HCL 50 MG PO TABS: 50.0000 mg | ORAL_TABLET | Freq: Four times a day (QID) | ORAL | Status: DC | PRN

## 2015-06-18 MED ORDER — IBUPROFEN 800 MG PO TABS: 800.0000 mg | ORAL_TABLET | Freq: Three times a day (TID) | ORAL | Status: DC | PRN

## 2015-06-18 NOTE — ED Notes (Signed)
Patient transported to X-ray 
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
## 2015-06-18 NOTE — Discharge Instructions (Signed)
Return here as needed.  Follow-up with a primary care doctor.  Your's x-rays did not show any significant abnormalities.  Use ice and heat on your neck and back
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
## 2015-06-29 ENCOUNTER — Ambulatory Visit: Payer: Self-pay

## 2015-07-14 ENCOUNTER — Other Ambulatory Visit: Payer: Self-pay | Admitting: *Deleted

## 2015-07-14 DIAGNOSIS — F319 Bipolar disorder, unspecified: Secondary | ICD-10-CM

## 2015-08-23 ENCOUNTER — Ambulatory Visit (INDEPENDENT_AMBULATORY_CARE_PROVIDER_SITE_OTHER): Payer: Self-pay | Admitting: Family Medicine

## 2015-08-23 ENCOUNTER — Encounter: Payer: Self-pay | Admitting: Family Medicine

## 2015-08-23 VITALS — BP 123/72 | HR 73 | Temp 98.0°F | Ht 64.5 in | Wt 208.0 lb

## 2015-08-23 DIAGNOSIS — M25562 Pain in left knee: Secondary | ICD-10-CM

## 2015-08-23 DIAGNOSIS — M25561 Pain in right knee: Secondary | ICD-10-CM

## 2015-08-23 DIAGNOSIS — F319 Bipolar disorder, unspecified: Secondary | ICD-10-CM

## 2015-08-23 DIAGNOSIS — F411 Generalized anxiety disorder: Secondary | ICD-10-CM

## 2015-08-23 DIAGNOSIS — I10 Essential (primary) hypertension: Secondary | ICD-10-CM

## 2015-08-23 MED ORDER — CLONAZEPAM 0.5 MG PO TABS: 0.5000 mg | ORAL_TABLET | Freq: Two times a day (BID) | ORAL | Status: DC | PRN

## 2015-08-23 MED ORDER — CETIRIZINE HCL 10 MG PO TABS: 10.0000 mg | ORAL_TABLET | Freq: Every day | ORAL | Status: DC

## 2015-08-23 MED ORDER — METHYLPREDNISOLONE ACETATE 80 MG/ML IJ SUSP
80.0000 mg | Freq: Once | INTRAMUSCULAR | Status: AC
  Administered 2015-08-23: 80 mg via INTRA_ARTICULAR

## 2015-08-23 MED ORDER — LISINOPRIL-HYDROCHLOROTHIAZIDE 10-12.5 MG PO TABS: 1.0000 | ORAL_TABLET | Freq: Every day | ORAL | Status: DC

## 2015-08-23 MED ORDER — MELOXICAM 7.5 MG PO TABS: 7.5000 mg | ORAL_TABLET | Freq: Every day | ORAL | Status: DC
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
## 2015-08-23 NOTE — Assessment & Plan Note (Signed)
Urged to re-establish with [facility] once she obtains insurance coverage. Will refill low dose benzodiazepine in the interim.

## 2015-08-23 NOTE — Addendum Note (Signed)
Addended by: Lamonte Sakai, APRIL D on: 08/23/2015 04:54 PM   Modules accepted: Orders

## 2015-08-23 NOTE — Assessment & Plan Note (Signed)
Left knee was injected today after dry tap attempt x1 without ultrasound guidance. Tolerated well, postprocedure instructions provided. Continue tylenol and NSAIDs prn.

## 2015-08-23 NOTE — Patient Instructions (Signed)
Thank you for coming in today!  - Bring all medications in a bag to your visits. - Make sure you get the orange card and get established with [facility].   Our clinic's number is [phone]. Feel free to call any time with questions or concerns. We will answer any questions after hours with our 24-hour emergency line at that number as well.   - Dr. Jarvis Newcomer

## 2015-08-23 NOTE — Assessment & Plan Note (Signed)
At goal, continue current medications

## 2015-08-23 NOTE — Progress Notes (Signed)
Subjective: Kristin [name] is a 40 y.o. female returning for medication refills.  She has taken BP medications without interruption, doesn't check BP. Denies CP, SOB, orthopnea, palpitations.   Reports bilateral knee pain worse with walking though it seems to improve after getting up and around every day. No giving out, popping or locking. Told she needs knee surgery on the left, but doesn't have insurance.   Has not been taking latuda due to finances. Hasn't established with [facility] yet, but applying for orange card today and will estaqblish then. Has been on clonazepam long term with good control of anxiety. Anxiety is generalized without fear of going outdoors. Klonopin has allowed her to keep her job. She reports manic reaction to SSRI in the past.   - Smoker x 8 years. Not interested in quitting or cutting down at this time.   Objective: BP 123/72 mmHg  Pulse 73  Temp(Src) 98 F (36.7 C) (Oral)  Ht 5' 4.5" (1.638 m)  Wt 208 lb (94.348 kg)  BMI 35.16 kg/m2  LMP 07/29/2015 Gen: Well-appearing, pleasant 40 y.o. female in no distress HEENT: MMM, posterior oropharynx clear Pulm: Non-labored; CTAB, no wheezes  CV: Regular rate, no murmur appreciated; distal pulses intact/symmetric Skin: Many tattoos, eczematous eruption on bilateral shins, no wounds.  MSK: Knees with no overlying erythema or warmth, but significant effusions bilaterally and medial joint line tenderness. Negative thesally. Patellar and quad tendons wnl. AROM only restricted at full extension.  Neuro: A&Ox3, CN II-XII without deficits Psych: Neatly groomed, wearing pajamas. Maintains good eye contact and is cooperative and attentive. Speech is normal volume and rate. Mood is normal with a mildly restricted affect. No suicidal or homicidal ideation. Does not appear to be responding to any internal stimuli. Able to maintain train of thought and concentrate on the questions. No fidgeting. Cognitive ability may be low  average.  PROCEDURE NOTE:  Left knee arthrocentesis and injection 4:33 PM 08/23/2015  After consent was obtained, using sterile technique the inferolateral joint space of the left knee was prepped and draped in the usual sterile fashion. The joint was entered and negative pressure applied by 20 cc syringe with no fluid return. 1cc of 80mg /ml depomedrol and 4 ml plain Lidocaine was then injected and the needle withdrawn.  The procedure was well tolerated.   Assessment/Plan: Kristin [name] is a 40 y.o. female here for HTN, knee pain, and anxiety.

## 2015-10-19 ENCOUNTER — Encounter (HOSPITAL_COMMUNITY): Payer: Self-pay | Admitting: Emergency Medicine

## 2015-10-19 ENCOUNTER — Ambulatory Visit (HOSPITAL_COMMUNITY)
Admission: EM | Admit: 2015-10-19 | Discharge: 2015-10-19 | Disposition: A | Discharge status: Home / Self Care | Payer: Self-pay | Attending: Family Medicine | Admitting: Family Medicine

## 2015-10-19 DIAGNOSIS — Z3490 Encounter for supervision of normal pregnancy, unspecified, unspecified trimester: Secondary | ICD-10-CM

## 2015-10-19 DIAGNOSIS — Z3201 Encounter for pregnancy test, result positive: Secondary | ICD-10-CM

## 2015-10-19 DIAGNOSIS — N939 Abnormal uterine and vaginal bleeding, unspecified: Secondary | ICD-10-CM

## 2015-10-19 LAB — POCT PREGNANCY, URINE: PREG TEST UR: POSITIVE — AB

## 2015-10-19 NOTE — ED Notes (Signed)
Patients positive preg test was reported/verified to [employer]

## 2015-10-19 NOTE — ED Provider Notes (Signed)
CSN: [id number]     Arrival date & time 10/19/15  1555 History   None    Chief Complaint  Patient presents with  . Possible Pregnancy   (Consider location/radiation/quality/duration/timing/severity/associated sxs/prior Treatment) Patient checked at home pregnancy test and it was positive yesterday.  She has been spotting like she is on her menses and she is concerned and wants to get checked.   The history is provided by the patient.  Possible Pregnancy  This is a new problem. The current episode started yesterday. The problem occurs constantly. The problem has not changed since onset.Nothing aggravates the symptoms. Nothing relieves the symptoms. She has tried nothing for the symptoms.    Past Medical History:  Diagnosis Date  . Anxiety   . Depression   . Hypertension   . Migraine headache    History reviewed. No pertinent surgical history. Family History  Problem Relation Age of Onset  . Cancer Maternal Grandmother     Breast  . Hypertension Mother   . Hypertension Brother   . Cancer Maternal Uncle     colon   Social History  Substance Use Topics  . Smoking status: Current Every Day Smoker    Packs/day: 0.25    Years: 5.00  . Smokeless tobacco: Not on file  . Alcohol use Yes     Comment: occassionally    OB History    Gravida Para Term Preterm AB Living   4 3 3   1 3    SAB TAB Ectopic Multiple Live Births     1           Review of Systems  Constitutional: Negative.   HENT: Negative.   Eyes: Negative.   Respiratory: Negative.   Cardiovascular: Negative.   Gastrointestinal: Negative.   Endocrine: Negative.   Genitourinary: Negative.   Musculoskeletal: Negative.   Skin: Negative.   Allergic/Immunologic: Negative.   Neurological: Negative.   Hematological: Negative.   Psychiatric/Behavioral: Negative.     Allergies  Sumatriptan  Home Medications   Prior to Admission medications   Medication Sig Start Date End Date Taking? Authorizing Provider   cetirizine (ZYRTEC) 10 MG tablet Take 1 tablet (10 mg total) by mouth daily. 08/23/15   Tyrone Nine, MD  clonazePAM (KLONOPIN) 0.5 MG tablet Take 1 tablet (0.5 mg total) by mouth 2 (two) times daily as needed for anxiety. 08/23/15   Tyrone Nine, MD  lisinopril-hydrochlorothiazide (PRINZIDE,ZESTORETIC) 10-12.5 MG tablet Take 1 tablet by mouth daily. 08/23/15   Tyrone Nine, MD  lurasidone (LATUDA) 40 MG TABS tablet Take 1 tablet (40 mg total) by mouth daily with breakfast. 12/21/14   Tyrone Nine, MD  meloxicam (MOBIC) 7.5 MG tablet Take 1 tablet (7.5 mg total) by mouth daily. 08/23/15   Tyrone Nine, MD  traZODone (DESYREL) 100 MG tablet Take 1 tablet (100 mg total) by mouth at bedtime. 12/21/14   Tyrone Nine, MD   Meds Ordered and Administered this Visit  Medications - No data to display  BP 139/93 (BP Location: Left Arm)   Pulse 83   Temp 98.7 F (37.1 C) (Oral)   Resp 14   LMP 10/08/2015 (Approximate)   SpO2 100%  No data found.   Physical Exam  Constitutional: She appears well-developed and well-nourished.  HENT:  Head: Normocephalic and atraumatic.  Right Ear: External ear normal.  Left Ear: External ear normal.  Mouth/Throat: Oropharynx is clear and moist.  Eyes: Conjunctivae and EOM are normal. Pupils are  equal, round, and reactive to light.  Neck: Normal range of motion. Neck supple.  Cardiovascular: Normal rate, regular rhythm, normal heart sounds and intact distal pulses.   Pulmonary/Chest: Effort normal and breath sounds normal.    Urgent Care Course   Clinical Course    Procedures (including critical care time)  Labs Review Labs Reviewed  POCT PREGNANCY, URINE - Abnormal; Notable for the following:       Result Value   Preg Test, Ur POSITIVE (*)    All other components within normal limits    Imaging Review No results found.   Visual Acuity Review  Right Eye Distance:   Left Eye Distance:   Bilateral Distance:    Right Eye Near:   Left Eye Near:     Bilateral Near:         MDM   1. Pregnancy   2. Uterine bleeding    [facility] and spoke with Dr. Berton Lan who accepts and patient is DC'd and goes by POV to [facility].    Deatra Canter, FNP 10/19/15 1716    Deatra Canter, FNP 10/19/15 [phone]

## 2015-10-19 NOTE — ED Triage Notes (Signed)
Patient reports last normal period was in late June.  Reports spotting in July and now

## 2015-12-13 ENCOUNTER — Ambulatory Visit (INDEPENDENT_AMBULATORY_CARE_PROVIDER_SITE_OTHER): Payer: Self-pay | Admitting: *Deleted

## 2015-12-13 DIAGNOSIS — Z111 Encounter for screening for respiratory tuberculosis: Secondary | ICD-10-CM

## 2015-12-13 NOTE — Progress Notes (Signed)
Tuberculin skin test applied to left ventral forearm. Explained how to read the test, measuring induration not just erythema; she will come into office in 48-72 hours to have test read. 

## 2015-12-15 ENCOUNTER — Encounter: Payer: Self-pay | Admitting: *Deleted

## 2015-12-15 ENCOUNTER — Ambulatory Visit (INDEPENDENT_AMBULATORY_CARE_PROVIDER_SITE_OTHER): Payer: Self-pay | Admitting: *Deleted

## 2015-12-15 DIAGNOSIS — Z111 Encounter for screening for respiratory tuberculosis: Secondary | ICD-10-CM

## 2015-12-15 LAB — TB SKIN TEST
Induration: 0 mm
TB SKIN TEST: NEGATIVE

## 2015-12-15 NOTE — Progress Notes (Signed)
   PPD Reading Note PPD read and results entered in EpicCare. Result: 0 mm induration. Interpretation: Negative If test not read within 48-72 hours of initial placement, patient advised to repeat in other arm 1-3 weeks after this test. Allergic reaction: no  Martin, Tamika L, RN  

## 2015-12-27 ENCOUNTER — Other Ambulatory Visit: Payer: Self-pay | Admitting: Student

## 2015-12-27 ENCOUNTER — Other Ambulatory Visit: Payer: Self-pay | Admitting: *Deleted

## 2015-12-27 DIAGNOSIS — G4700 Insomnia, unspecified: Secondary | ICD-10-CM

## 2015-12-27 MED ORDER — TRAZODONE HCL 100 MG PO TABS: 100.0000 mg | ORAL_TABLET | Freq: Every day | ORAL | 5 refills | Status: DC

## 2016-07-28 ENCOUNTER — Encounter (HOSPITAL_COMMUNITY): Payer: Self-pay | Admitting: *Deleted

## 2016-07-28 ENCOUNTER — Emergency Department (HOSPITAL_COMMUNITY): Payer: Self-pay

## 2016-07-28 ENCOUNTER — Emergency Department (HOSPITAL_COMMUNITY)
Admission: EM | Admit: 2016-07-28 | Discharge: 2016-07-28 | Disposition: A | Discharge status: Home / Self Care | Payer: Self-pay | Attending: Emergency Medicine | Admitting: Emergency Medicine

## 2016-07-28 DIAGNOSIS — M5442 Lumbago with sciatica, left side: Secondary | ICD-10-CM | POA: Insufficient documentation

## 2016-07-28 DIAGNOSIS — M5441 Lumbago with sciatica, right side: Secondary | ICD-10-CM | POA: Insufficient documentation

## 2016-07-28 DIAGNOSIS — I10 Essential (primary) hypertension: Secondary | ICD-10-CM | POA: Insufficient documentation

## 2016-07-28 DIAGNOSIS — F17200 Nicotine dependence, unspecified, uncomplicated: Secondary | ICD-10-CM | POA: Insufficient documentation

## 2016-07-28 DIAGNOSIS — Z79899 Other long term (current) drug therapy: Secondary | ICD-10-CM | POA: Insufficient documentation

## 2016-07-28 LAB — URINALYSIS, ROUTINE W REFLEX MICROSCOPIC
BILIRUBIN URINE: NEGATIVE
GLUCOSE, UA: NEGATIVE mg/dL
KETONES UR: NEGATIVE mg/dL
LEUKOCYTES UA: NEGATIVE
NITRITE: NEGATIVE
Protein, ur: NEGATIVE mg/dL
Specific Gravity, Urine: 1.006 (ref 1.005–1.030)
pH: 6 (ref 5.0–8.0)

## 2016-07-28 MED ORDER — LORAZEPAM 1 MG PO TABS
1.0000 mg | ORAL_TABLET | Freq: Once | ORAL | Status: AC
Start: 2016-07-28 — End: 2016-07-28
  Administered 2016-07-28: 1 mg via ORAL
  Filled 2016-07-28: qty 1

## 2016-07-28 MED ORDER — METHYLPREDNISOLONE 4 MG PO TBPK: ORAL_TABLET | ORAL | 0 refills | Status: DC

## 2016-07-28 MED ORDER — CYCLOBENZAPRINE HCL 10 MG PO TABS: 10.0000 mg | ORAL_TABLET | Freq: Two times a day (BID) | ORAL | 0 refills | Status: DC | PRN

## 2016-07-28 NOTE — ED Notes (Signed)
Pt being transported to MRI.

## 2016-07-28 NOTE — ED Provider Notes (Signed)
[facility]-EMERGENCY DEPT Provider Note   CSN: [id number] Arrival date & time: 07/28/16  1611  By signing my name below, I, Kristin [name], attest that this documentation has been prepared under the direction and in the presence of [facility], PA-C. Electronically Signed: Cynda [name], Scribe. 07/28/16. 5:09 PM.  History   Chief Complaint Chief Complaint  Patient presents with  . Back Pain   HPI Comments: Kristin [name] is a 41 y.o. female with no pertinent past medical history, who presents to the Emergency Department complaining of sudden-onset, intermittent bilateral lower back pain that began two weeks ago. Patient reports low back pain that radiates down the bilateral legs circumferentially. 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 Patient states her pain is worse in the evening. Patient states she falls all of the time due to her "bad knees", last fall was three weeks ago when mis-stepped on her left foot. Patient reports associated pain with ambulation, nausea, unsteady gait, hip weakness, and bowel incontinence x3 episodes, most recently last night. Patient describes her pain as sharp, with a severity of 8/10. Patient reports taking Advil and aleve with mild relief. Patient is ambulatory without difficulty in the emergency room. Pain is worse with standing, nothing improves her pain. Patient denies any IV drug use or history of cancer. Patient denies any fever, chills, numbness, nausea, abdominal pain, or any other symptoms.   The history is provided by the patient. No language interpreter was used.    Past Medical History:  Diagnosis Date  . Anxiety   . Depression   . Hypertension   . Migraine headache     Patient Active Problem List   Diagnosis Date Noted  . Generalized anxiety disorder 08/23/2015  . Knee pain 12/21/2014  . Menorrhagia 09/04/2012  . Allergic rhinitis 06/12/2012  . Eczema 03/26/2012  . Alcohol abuse 10/26/2010  . Bipolar 1 disorder  (HCC) 10/09/2010  . Panic attacks 10/09/2010  . INSOMNIA, CHRONIC 06/29/2009  . TOBACCO USER 01/23/2009  . Obesity 10/29/2008  . Essential hypertension 12/20/2006    History reviewed. No pertinent surgical history.  OB History    Gravida Para Term Preterm AB Living   4 3 3   1 3    SAB TAB Ectopic Multiple Live Births     1             Home Medications    Prior to Admission medications   Medication Sig Start Date End Date Taking? Authorizing Provider  cetirizine (ZYRTEC) 10 MG tablet Take 1 tablet (10 mg total) by mouth daily. 08/23/15   Tyrone Nine, MD  clonazePAM (KLONOPIN) 0.5 MG tablet Take 1 tablet (0.5 mg total) by mouth 2 (two) times daily as needed for anxiety. 08/23/15   Tyrone Nine, MD  cyclobenzaprine (FLEXERIL) 10 MG tablet Take 1 tablet (10 mg total) by mouth 2 (two) times daily as needed for muscle spasms. 07/28/16   Diontae Route A, PA-C  lisinopril-hydrochlorothiazide (PRINZIDE,ZESTORETIC) 10-12.5 MG tablet Take 1 tablet by mouth daily. 08/23/15   Tyrone Nine, MD  lurasidone (LATUDA) 40 MG TABS tablet Take 1 tablet (40 mg total) by mouth daily with breakfast. 12/21/14   Tyrone Nine, MD  meloxicam (MOBIC) 7.5 MG tablet Take 1 tablet (7.5 mg total) by mouth daily. 08/23/15   Tyrone Nine, MD  methylPREDNISolone (MEDROL DOSEPAK) 4 MG TBPK tablet Day 1: 2 tablets before breakfast, 1 after lunch, 1 after dinner, and  2 at bedtime. Day 2: 1 tablet before breakfast, 1after lunch, 1 after dinner, and 2 at bedtime Day 3: 1 tablet before breakfast, 1 after lunch, 1 after dinner, and 1 at bedtime Day 4: 1 tablet before breakfast, 1 after lunch, and 1 at bedtime Day 5: 1 tablet before breakfast and 1 at bedtime Day 6: 1 tablet before breakfast 07/28/16   Devyn Sheerin A, PA-C  traZODone (DESYREL) 100 MG tablet Take 1 tablet (100 mg total) by mouth at bedtime. 12/27/15   Almon Hercules, MD    Family History Family History  Problem Relation Age of Onset  . Cancer Maternal  Grandmother        Breast  . Hypertension Mother   . Hypertension Brother   . Cancer Maternal Uncle        colon    Social History Social History  Substance Use Topics  . Smoking status: Current Every Day Smoker    Packs/day: 0.25    Years: 5.00  . Smokeless tobacco: Never Used  . Alcohol use Yes     Comment: occassionally      Allergies   Sumatriptan   Review of Systems Review of Systems  Constitutional: Negative for chills and fever.  Gastrointestinal: Positive for vomiting. Negative for abdominal pain and nausea.       Bowel incontinence  Musculoskeletal: Positive for back pain and gait problem. Negative for joint swelling.  Neurological: Positive for weakness. Negative for numbness.  All other systems reviewed and are negative.    Physical Exam Updated Vital Signs BP 136/81 (BP Location: Right Arm)   Pulse 80   Temp 98.1 F (36.7 C) (Oral)   Resp 18   SpO2 96%   Physical Exam  Constitutional: She is oriented to person, place, and time. She appears well-developed and well-nourished.  Resting comfortably in chair  HENT:  Head: Normocephalic.  Eyes: EOM are normal.  Neck: Normal range of motion.  Cardiovascular: Normal rate and intact distal pulses.   2+ dp/pt pulses bl, negative homans  Pulmonary/Chest: Effort normal.  Abdominal: Soft. She exhibits no distension. There is no tenderness.  Musculoskeletal: Normal range of motion.  Midline LSP TTP as well as bilateral paraspinal muscle tenderness. Full ROM of LSP with pain on flexion and extension. 4/5 strength of bl hips on flexion. 5/5 strength of gastrocs and EHLs.   Neurological: She is alert and oriented to person, place, and time. A sensory deficit is present. No cranial nerve deficit.  Reflex Scores:      Tricep reflexes are 1+ on the right side and 1+ on the left side.      Bicep reflexes are 1+ on the right side and 1+ on the left side.      Brachioradialis reflexes are 1+ on the right side and 1+  on the left side.      Patellar reflexes are 1+ on the right side and 1+ on the left side.      Achilles reflexes are 1+ on the right side and 1+ on the left side. Fluent speech, no facial droop,altered sensation of BLE circumferentially, antalgic gait, unable to heel walk or toe walk. Babinski's absent.   Skin: Skin is warm and dry. Capillary refill takes less than 2 seconds.  Psychiatric: She has a normal mood and affect. Her behavior is normal.  Nursing note and vitals reviewed.    ED Treatments / Results  DIAGNOSTIC STUDIES: Oxygen Saturation is 98% on RA, normal by my  interpretation.    COORDINATION OF CARE: 5:08 PM Discussed treatment plan with pt at bedside and pt agreed to plan, which includes pain medication and imaging.   Labs (all labs ordered are listed, but only abnormal results are displayed) Labs Reviewed  URINALYSIS, ROUTINE W REFLEX MICROSCOPIC - Abnormal; Notable for the following:       Result Value   Hgb urine dipstick SMALL (*)    Bacteria, UA RARE (*)    Squamous Epithelial / LPF 0-5 (*)    All other components within normal limits    EKG  EKG Interpretation None       Radiology Mr Lumbar Spine Wo Contrast  Result Date: 07/28/2016 CLINICAL DATA:  Low back pain radiating to lower extremities for 2 weeks. Stool incontinence. EXAM: MRI LUMBAR SPINE WITHOUT CONTRAST TECHNIQUE: Multiplanar, multisequence MR imaging of the lumbar spine was performed. No intravenous contrast was administered. COMPARISON:  Lumbar spine radiographs June 18, 2015 FINDINGS: Mild motion degraded examination. SEGMENTATION: For the purposes of this report, the last well-formed intervertebral disc will be described as L5-S1. ALIGNMENT: Maintenance of the lumbar lordosis. No malalignment. VERTEBRAE:Vertebral bodies are intact. Intervertebral discs demonstrate normal morphology and signal characteristics. No abnormal bone marrow signal. CONUS MEDULLARIS: Conus medullaris terminates at L1-2  and demonstrates normal morphology and signal characteristics. Cauda equina is normal. PARASPINAL AND SOFT TISSUES: Included prevertebral and paraspinal soft tissues are normal. 3 cm LEFT adnexal cyst with fluid fluid level consistent with hemorrhagic cyst. DISC LEVELS: L1-2 and L2-3: No disc bulge, canal stenosis nor neural foraminal narrowing. L3-4: Annular bulging. Mild facet arthropathy and ligamentum flavum redundancy without canal stenosis or neural foraminal narrowing. L4-5: Small broad-based disc bulge. Moderate LEFT subarticular broad-based disc protrusion posteriorly displaces the traversing LEFT L5 within the lateral recess. Mild facet arthropathy and ligamentum flavum redundancy. Minimal canal stenosis. Mild RIGHT, mild-to-moderate LEFT neural foraminal narrowing. L5-S1: Annular bulging asymmetric to the RIGHT. Mild facet arthropathy and ligamentum flavum redundancy without canal stenosis or neural foraminal narrowing. IMPRESSION: Moderate L4-5 disc protrusion resulting in minimal canal stenosis and displacement of the traversing LEFT L5 nerve. Mild to moderate LEFT L4-5 neural foraminal narrowing. 3 cm hemorrhagic LEFT adnexal cyst. No necessary follow-up. This recommendation follows ACR consensus guidelines: White Paper of the ACR Incidental Findings Committee II on Adnexal Findings. J Am Coll Radiol [phone]. Electronically Signed   By: Awilda Metro M.D.   On: 07/28/2016 19:04    Procedures Procedures (including critical care time)  Medications Ordered in ED Medications  LORazepam (ATIVAN) tablet 1 mg (1 mg Oral Given 07/28/16 1733)     Initial Impression / Assessment and Plan / ED Course  I have reviewed the triage vital signs and the nursing notes.  Pertinent labs & imaging results that were available during my care of the patient were reviewed by me and considered in my medical decision making (see chart for details).      Patient with back pain.  No neurological  deficits and normal neuro exam.  Patient can walk but states is painful.  No fever, night sweats, weight loss, h/o cancer, IVDU. With recent history bowel incontinence, concern for cauda equina syndrome. Obtained MRI of lumbar spine which showed moderate disc protrusion at L4-5 neural foraminal narrowing and nerve impingement. Incidental finding of left adnexal cyst. No evidence of cauda equina syndrome. Discussed findings with patient. RICE protocol, muscle relaxant, steroid pack indicated and discussed with patient. Discussed use of ice or heat to the affected areas  as well as gentle stretching. Recommend follow-up with neurosurgery for further evaluation. Patient states she already knows she has degeneration of discs in her lumbar spine and has seen a neurosurgeon in the past. Discussed strict ED return precautions. Pt verbalized understanding of and agreement with plan and is safe for discharge home at this time.  Final Clinical Impressions(s) / ED Diagnoses   Final diagnoses:  Acute bilateral low back pain with bilateral sciatica    New Prescriptions Discharge Medication List as of 07/28/2016  7:22 PM    START taking these medications   Details  cyclobenzaprine (FLEXERIL) 10 MG tablet Take 1 tablet (10 mg total) by mouth 2 (two) times daily as needed for muscle spasms., Starting Fri 07/28/2016, Print    methylPREDNISolone (MEDROL DOSEPAK) 4 MG TBPK tablet Day 1: 2 tablets before breakfast, 1 after lunch, 1 after dinner, and 2 at bedtime. Day 2: 1 tablet before breakfast, 1after lunch, 1 after dinner, and 2 at bedtime Day 3: 1 tablet before breakfast, 1 after lunch, 1 after dinner, and 1 at bedtime Day  4: 1 tablet before breakfast, 1 after lunch, and 1 at bedtime Day 5: 1 tablet before breakfast and 1 at bedtime Day 6: 1 tablet before breakfast, Print       I personally performed the services described in this documentation, which was scribed in my presence. The recorded information has  been reviewed and is accurate.    Jeanie Sewer, PA-C 07/29/16 0217    Maia Plan, MD 07/29/16 [phone]

## 2016-07-28 NOTE — Discharge Instructions (Signed)

## 2016-07-28 NOTE — ED Notes (Signed)
Pt to MRI

## 2016-07-28 NOTE — ED Triage Notes (Signed)
To ED for eval of lower back pain for past 2 weeks. States the pain moves down her legs. No difficulty with urination. Denies injury. Ambulatory without difficutly

## 2016-08-10 ENCOUNTER — Ambulatory Visit: Payer: Self-pay | Admitting: Student

## 2016-08-25 ENCOUNTER — Ambulatory Visit (INDEPENDENT_AMBULATORY_CARE_PROVIDER_SITE_OTHER): Payer: BLUE CROSS/BLUE SHIELD | Admitting: Student

## 2016-08-25 ENCOUNTER — Encounter: Payer: Self-pay | Admitting: Student

## 2016-08-25 DIAGNOSIS — M5442 Lumbago with sciatica, left side: Secondary | ICD-10-CM

## 2016-08-25 DIAGNOSIS — F411 Generalized anxiety disorder: Secondary | ICD-10-CM | POA: Diagnosis not present

## 2016-08-25 DIAGNOSIS — M25561 Pain in right knee: Secondary | ICD-10-CM

## 2016-08-25 DIAGNOSIS — M5441 Lumbago with sciatica, right side: Secondary | ICD-10-CM

## 2016-08-25 DIAGNOSIS — M25562 Pain in left knee: Secondary | ICD-10-CM

## 2016-08-25 DIAGNOSIS — I10 Essential (primary) hypertension: Secondary | ICD-10-CM

## 2016-08-25 DIAGNOSIS — G8929 Other chronic pain: Secondary | ICD-10-CM | POA: Diagnosis not present

## 2016-08-25 DIAGNOSIS — M549 Dorsalgia, unspecified: Secondary | ICD-10-CM | POA: Insufficient documentation

## 2016-08-25 MED ORDER — CYCLOBENZAPRINE HCL 10 MG PO TABS: 10.0000 mg | ORAL_TABLET | Freq: Two times a day (BID) | ORAL | 0 refills | Status: DC | PRN

## 2016-08-25 MED ORDER — LISINOPRIL-HYDROCHLOROTHIAZIDE 10-12.5 MG PO TABS: 1.0000 | ORAL_TABLET | Freq: Every day | ORAL | 3 refills | Status: DC

## 2016-08-25 MED ORDER — MELOXICAM 15 MG PO TABS: 7.5000 mg | ORAL_TABLET | Freq: Every day | ORAL | 0 refills | Status: DC
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
## 2016-08-25 NOTE — Assessment & Plan Note (Addendum)
Likely due to disc bulge as noted on her recent MRI of lumbar spine. No red flag for quada equina. Will treat with Mobic 15 mg daily and Flexeril 10 mg twice a day as needed. Follow up in two weeks.

## 2016-08-25 NOTE — Assessment & Plan Note (Addendum)
Patient to follow up in clinic in two weeks to discuss this in detail. She hasn't been seen in our clinic over a year. I don't think Klonopin is a good medicine for long term management of her anxiety. She also has history of BPD-1. She is currently not on any antipsychotic medication. She may need to follow up at [facility] as she has a new health insurance now. She is not a danger to self or others.

## 2016-08-25 NOTE — Patient Instructions (Signed)
It was great seeing you today! We have addressed the following issues today 1. Back pain: this is likely due to bulging disc in your back as seen on your recent MRI. I sent a prescription for meloxicam and flexeril to your pharmacy. I recommend avoiding heavy lifting greater than 10 pounds. I suggest follow-up in 2 weeks or sooner if needed.   If we did any lab work today, and the results require attention, either me or my nurse will get in touch with you. If everything is normal, you will get a letter in mail and a message via . If you don't hear from [place] in two weeks, please give [place] a call. Otherwise, we look forward to seeing you again at your next visit. If you have any questions or concerns before then, please call the clinic at [phone].  Please bring all your medications to every doctors visit  Sign up for My Chart to have easy access to your labs results, and communication with your Primary care physician.    Please check-out at the front desk before leaving the clinic.    Take Care,   Dr. Alanda Slim

## 2016-09-07 ENCOUNTER — Encounter: Payer: Self-pay | Admitting: Family Medicine

## 2016-09-07 ENCOUNTER — Ambulatory Visit (INDEPENDENT_AMBULATORY_CARE_PROVIDER_SITE_OTHER): Payer: BLUE CROSS/BLUE SHIELD | Admitting: Family Medicine

## 2016-09-07 ENCOUNTER — Telehealth: Payer: Self-pay | Admitting: Student

## 2016-09-07 VITALS — BP 136/98 | HR 82 | Temp 98.2°F | Ht 65.0 in | Wt 200.0 lb

## 2016-09-07 DIAGNOSIS — M545 Low back pain, unspecified: Secondary | ICD-10-CM

## 2016-09-07 DIAGNOSIS — G8929 Other chronic pain: Secondary | ICD-10-CM | POA: Diagnosis not present

## 2016-09-07 MED ORDER — PREDNISONE 50 MG PO TABS: 50.0000 mg | ORAL_TABLET | Freq: Every day | ORAL | 0 refills | Status: DC

## 2016-09-07 MED ORDER — CLOTRIMAZOLE-BETAMETHASONE 1-0.05 % EX CREA: 1.0000 "application " | TOPICAL_CREAM | Freq: Two times a day (BID) | CUTANEOUS | 0 refills | Status: DC

## 2016-09-07 NOTE — Patient Instructions (Signed)
Thank you so much for coming to visit today! I have sent a prescription for a steroid to the pharmacy. Please continue the medications prescribed by your PCP. I have placed a referral to physical therapy. Please let us know if you do not hear from them soon! Please follow up with your PCP for your anxiety. If no improvement in back pain, you may need a referral to an orthopedist.  Dr. Caroleen Hamman

## 2016-09-07 NOTE — Progress Notes (Signed)
Subjective:     Patient ID: Kristin [name], female   DOB: 11-01-1975, 41 y.o.   MRN: [id number]  HPI Mrs. Reuss is a 41 year old female presenting today for back pain. Last seen on June 15 for back pain. Prescribed Mobic 15 mg daily and Flexeril 10 mg. Reports little improvement with medications prescribed. Pain is worse on her right back and occasionally shoots down her right leg. Reports numbness in her big toes bilaterally and in her groin. Reports she is able to feel herself urinating and wiping. Reports she had fecal incontinence for 3 weeks, resolved 3 weeks ago. Denies urinary or fecal incontinence at this time. Denies urinary retention. Denies weakness. Has not yet tried physical therapy.  Also of note patient requests refill of Klonopin. Discussed that PCP did not wish for her to be on this medication. Patient is upset and states she plans to change PCPs.  Review of Systems Per HPI    Objective:   Physical Exam  Constitutional: She appears well-developed and well-nourished. No distress.  Cardiovascular: Normal rate and regular rhythm.   No murmur heard. Pulmonary/Chest: Effort normal. No respiratory distress. She has no wheezes.  Musculoskeletal:  Tenderness over right lumbar paraspinal muscles. Negative straight leg raise bilaterally. Muscle strength 5 out of 5 in bilateral lower extremities, 4+ out of 5 with dorsiflexion laterally. Gait normal.  Neurological:  Reports a decreased sensation in bilateral lower extremities, right worse than left.      Assessment and Plan:     1. Chronic right-sided low back pain without sciatica MRI obtained May 18 showed moderate L4-5 disc protrusion resulting in minimal canal stenosis and displacement of the left L5 nerve, mild to moderate left L88-72-year-old for minimal narrowing. This MRI was obtained in the midst of patient reporting fecal incontinence, which has resolved. Continue Mobic and Flexeril. Will also try a course of prednisone.  Referral placed to physical therapy. Discussed if no improvement, patient may require referral to orthopedics or neurosurgery. Follow-up with PCP.

## 2016-09-07 NOTE — Telephone Encounter (Signed)
Pt would like to talk to dr Alanda Slim. She wants to know why he will not refill her klonopin.

## 2016-09-08 ENCOUNTER — Emergency Department (HOSPITAL_COMMUNITY)
Admission: EM | Admit: 2016-09-08 | Discharge: 2016-09-09 | Discharge status: Against Medical Advice | Payer: BLUE CROSS/BLUE SHIELD | Attending: Emergency Medicine | Admitting: Emergency Medicine

## 2016-09-08 ENCOUNTER — Other Ambulatory Visit: Payer: Self-pay | Admitting: Student

## 2016-09-08 ENCOUNTER — Emergency Department (HOSPITAL_COMMUNITY): Payer: BLUE CROSS/BLUE SHIELD

## 2016-09-08 DIAGNOSIS — Z79899 Other long term (current) drug therapy: Secondary | ICD-10-CM | POA: Insufficient documentation

## 2016-09-08 DIAGNOSIS — F17200 Nicotine dependence, unspecified, uncomplicated: Secondary | ICD-10-CM | POA: Diagnosis not present

## 2016-09-08 DIAGNOSIS — Y999 Unspecified external cause status: Secondary | ICD-10-CM | POA: Diagnosis not present

## 2016-09-08 DIAGNOSIS — Y929 Unspecified place or not applicable: Secondary | ICD-10-CM | POA: Diagnosis not present

## 2016-09-08 DIAGNOSIS — Z791 Long term (current) use of non-steroidal anti-inflammatories (NSAID): Secondary | ICD-10-CM | POA: Insufficient documentation

## 2016-09-08 DIAGNOSIS — Y9383 Activity, rough housing and horseplay: Secondary | ICD-10-CM | POA: Diagnosis not present

## 2016-09-08 DIAGNOSIS — I10 Essential (primary) hypertension: Secondary | ICD-10-CM | POA: Diagnosis not present

## 2016-09-08 DIAGNOSIS — F319 Bipolar disorder, unspecified: Secondary | ICD-10-CM

## 2016-09-08 DIAGNOSIS — Z23 Encounter for immunization: Secondary | ICD-10-CM | POA: Diagnosis not present

## 2016-09-08 DIAGNOSIS — S31811A Laceration without foreign body of right buttock, initial encounter: Secondary | ICD-10-CM | POA: Diagnosis not present

## 2016-09-08 MED ORDER — TETANUS-DIPHTH-ACELL PERTUSSIS 5-2.5-18.5 LF-MCG/0.5 IM SUSP
0.5000 mL | Freq: Once | INTRAMUSCULAR | Status: AC
  Administered 2016-09-09: 0.5 mL via INTRAMUSCULAR
  Filled 2016-09-08: qty 0.5

## 2016-09-08 MED ORDER — LIDOCAINE-EPINEPHRINE (PF) 2 %-1:200000 IJ SOLN
20.0000 mL | Freq: Once | INTRAMUSCULAR | Status: AC
  Administered 2016-09-09: 20 mL
  Filled 2016-09-08: qty 20

## 2016-09-08 MED ORDER — CLONAZEPAM 0.5 MG PO TABS: 0.5000 mg | ORAL_TABLET | Freq: Two times a day (BID) | ORAL | 0 refills | Status: DC | PRN

## 2016-09-08 NOTE — Progress Notes (Signed)
Called and talked to patient about her Klonopin. Patient states having bad anxiety. She reports using Klonopin at least every other day, sometimes up to 2 times a day. She also reports using Klonopin for sleep. She has not tried any other medications.  She also had history of bipolar disorder. She took herself of Latuda due to cost issue. She says she didn't have insurance at that time. She says she have [employer] now.  I have explained my concern about chronic use of Klonopin. I told her this is not a good medication for long-term use if she has anxiety almost everyday. Patient doesn't agree. She says Klonopin worked well for her. Off note, patient got her last Klonopin Rx from our clinic about one year ago. She was given one more refill at that time. For now, I gave her 1 prescription for Klonopin 0.5 mg, #60. Advised her to schedule a follow-up appointment to discuss about her anxiety and mood issue. Patient voiced understanding and agrees to do so. I left a prescription at the front desk for the patient to pick up
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
## 2016-09-08 NOTE — Discharge Instructions (Signed)
1. Medications: Tylenol or ibuprofen for pain, usual home medications °2. Treatment: ice for swelling, keep wound clean with warm soap and water and keep bandage dry, do not submerge in water for 24 hours °3. Follow Up: Please return in 7 days to have your stitches/staples removed or sooner if you have concerns. Return to the emergency department for increased redness, drainage of pus from the wound ° ° °WOUND CARE ° Keep area clean and dry for 24 hours. Do not remove bandage, if applied. ° After 24 hours, remove bandage and wash wound gently with mild soap and warm water. Reapply a new bandage after cleaning wound, if directed.  ° Continue daily cleansing with soap and water until stitches/staples are removed. ° Do not apply any ointments or creams to the wound while stitches/staples are in place, as this may cause delayed healing. °Return if you experience any of the following signs of infection: Swelling, redness, pus drainage, streaking, fever >101.0 F ° Return if you experience excessive bleeding that does not stop after 15-20 minutes of constant, firm pressure. ° °
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
## 2016-09-12 ENCOUNTER — Telehealth: Payer: Self-pay | Admitting: Physical Therapy

## 2016-09-12 NOTE — Telephone Encounter (Signed)
09/12/16 due to deductible not met, patient wants to hold off on PT.

## 2016-09-16 ENCOUNTER — Encounter (HOSPITAL_COMMUNITY): Payer: Self-pay | Admitting: Emergency Medicine

## 2016-09-16 ENCOUNTER — Ambulatory Visit (HOSPITAL_COMMUNITY)
Admission: EM | Admit: 2016-09-16 | Discharge: 2016-09-16 | Disposition: A | Discharge status: Home / Self Care | Payer: BLUE CROSS/BLUE SHIELD | Attending: Family Medicine | Admitting: Family Medicine

## 2016-09-16 DIAGNOSIS — Z4802 Encounter for removal of sutures: Secondary | ICD-10-CM

## 2016-09-16 MED ORDER — DICLOFENAC POTASSIUM 50 MG PO TABS: 50.0000 mg | ORAL_TABLET | Freq: Three times a day (TID) | ORAL | 0 refills | Status: DC

## 2016-09-16 NOTE — Discharge Instructions (Signed)
According to the record you had 2 sutures placed in the ED. Two sutures removed in the urgent care. Keep area clean
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
## 2016-09-16 NOTE — ED Triage Notes (Signed)
Pt presents for suture removal. Site has been sore according to patient. Denies signs of infection.

## 2016-09-21 ENCOUNTER — Emergency Department (HOSPITAL_COMMUNITY)
Admission: EM | Admit: 2016-09-21 | Discharge: 2016-09-21 | Disposition: A | Discharge status: Home / Self Care | Payer: BLUE CROSS/BLUE SHIELD | Attending: Emergency Medicine | Admitting: Emergency Medicine

## 2016-09-21 ENCOUNTER — Encounter (HOSPITAL_COMMUNITY): Payer: Self-pay | Admitting: Emergency Medicine

## 2016-09-21 DIAGNOSIS — Z79899 Other long term (current) drug therapy: Secondary | ICD-10-CM | POA: Insufficient documentation

## 2016-09-21 DIAGNOSIS — Y999 Unspecified external cause status: Secondary | ICD-10-CM | POA: Diagnosis not present

## 2016-09-21 DIAGNOSIS — I10 Essential (primary) hypertension: Secondary | ICD-10-CM | POA: Diagnosis not present

## 2016-09-21 DIAGNOSIS — F17210 Nicotine dependence, cigarettes, uncomplicated: Secondary | ICD-10-CM | POA: Diagnosis not present

## 2016-09-21 DIAGNOSIS — Z3201 Encounter for pregnancy test, result positive: Secondary | ICD-10-CM | POA: Diagnosis not present

## 2016-09-21 DIAGNOSIS — Z041 Encounter for examination and observation following transport accident: Secondary | ICD-10-CM | POA: Diagnosis present

## 2016-09-21 DIAGNOSIS — M791 Myalgia: Secondary | ICD-10-CM | POA: Insufficient documentation

## 2016-09-21 DIAGNOSIS — Y929 Unspecified place or not applicable: Secondary | ICD-10-CM | POA: Diagnosis not present

## 2016-09-21 DIAGNOSIS — Y939 Activity, unspecified: Secondary | ICD-10-CM | POA: Insufficient documentation

## 2016-09-21 LAB — POC URINE PREG, ED: PREG TEST UR: POSITIVE — AB

## 2016-09-21 MED ORDER — CYCLOBENZAPRINE HCL 10 MG PO TABS: 10.0000 mg | ORAL_TABLET | Freq: Two times a day (BID) | ORAL | 0 refills | Status: DC | PRN

## 2016-09-21 MED ORDER — ACETAMINOPHEN 500 MG PO TABS
1000.0000 mg | ORAL_TABLET | Freq: Once | ORAL | Status: AC
  Administered 2016-09-21: 1000 mg via ORAL
  Filled 2016-09-21: qty 2

## 2016-09-21 NOTE — ED Notes (Signed)
Discharge instructions reviewed with patient. Patient verbalizes understanding. VSS- patient hypertensive, but states she has history of hypertension and didn't take her lisinopril this morning. Patient states she will follow up with her OBGYN for pregnancy hypertension follow-up.
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
## 2016-09-21 NOTE — Discharge Instructions (Signed)
Flexeril is category B which is presumed to be safe in pregnancy based on animal studies.  You can take this in addition to tylenol as needed.  Follow up with an OB/GYN doctor to establish your prenatal care. Return to the emergency room for bleeding, abdominal pain, other concerns

## 2016-09-26 ENCOUNTER — Ambulatory Visit: Payer: BLUE CROSS/BLUE SHIELD | Admitting: Student

## 2016-10-07 ENCOUNTER — Encounter (HOSPITAL_COMMUNITY): Payer: Self-pay | Admitting: Emergency Medicine

## 2016-10-07 ENCOUNTER — Emergency Department (HOSPITAL_COMMUNITY)
Admission: EM | Admit: 2016-10-07 | Discharge: 2016-10-08 | Discharge status: Against Medical Advice | Payer: BLUE CROSS/BLUE SHIELD | Attending: Emergency Medicine | Admitting: Emergency Medicine

## 2016-10-07 DIAGNOSIS — Z79899 Other long term (current) drug therapy: Secondary | ICD-10-CM | POA: Diagnosis not present

## 2016-10-07 DIAGNOSIS — R51 Headache: Secondary | ICD-10-CM | POA: Insufficient documentation

## 2016-10-07 DIAGNOSIS — F17210 Nicotine dependence, cigarettes, uncomplicated: Secondary | ICD-10-CM | POA: Diagnosis not present

## 2016-10-07 DIAGNOSIS — F10920 Alcohol use, unspecified with intoxication, uncomplicated: Secondary | ICD-10-CM | POA: Diagnosis not present

## 2016-10-07 DIAGNOSIS — R55 Syncope and collapse: Secondary | ICD-10-CM | POA: Diagnosis not present

## 2016-10-07 DIAGNOSIS — Y908 Blood alcohol level of 240 mg/100 ml or more: Secondary | ICD-10-CM | POA: Insufficient documentation

## 2016-10-07 DIAGNOSIS — I10 Essential (primary) hypertension: Secondary | ICD-10-CM | POA: Diagnosis not present

## 2016-10-07 LAB — BASIC METABOLIC PANEL
Anion gap: 13 (ref 5–15)
BUN: 9 mg/dL (ref 6–20)
CHLORIDE: 104 mmol/L (ref 101–111)
CO2: 24 mmol/L (ref 22–32)
Calcium: 8.6 mg/dL — ABNORMAL LOW (ref 8.9–10.3)
Creatinine, Ser: 0.73 mg/dL (ref 0.44–1.00)
GFR calc Af Amer: >60 mL/min (ref >60)
GFR calc non Af Amer: >60 mL/min (ref >60)
Glucose, Bld: 93 mg/dL (ref 65–99)
POTASSIUM: 3.4 mmol/L — AB (ref 3.5–5.1)
SODIUM: 141 mmol/L (ref 135–145)

## 2016-10-07 LAB — CBG MONITORING, ED
GLUCOSE-CAPILLARY: 106 mg/dL — AB (ref 65–99)
Glucose-Capillary: 88 mg/dL (ref 65–99)

## 2016-10-07 LAB — CBC
HEMATOCRIT: 32.1 % — AB (ref 36.0–46.0)
Hemoglobin: 11.1 g/dL — ABNORMAL LOW (ref 12.0–15.0)
MCH: 29.8 pg (ref 26.0–34.0)
MCHC: 34.6 g/dL (ref 30.0–36.0)
MCV: 86.3 fL (ref 78.0–100.0)
Platelets: 137 10*3/uL — ABNORMAL LOW (ref 150–400)
RBC: 3.72 MIL/uL — AB (ref 3.87–5.11)
RDW: 14.6 % (ref 11.5–15.5)
WBC: 2.5 10*3/uL — AB (ref 4.0–10.5)

## 2016-10-07 LAB — ETHANOL: ALCOHOL ETHYL (B): 392 mg/dL — AB (ref <5)

## 2016-10-07 LAB — I-STAT BETA HCG BLOOD, ED (MC, WL, AP ONLY): I-stat hCG, quantitative: <5 m[IU]/mL (ref <5)

## 2016-10-07 MED ORDER — SODIUM CHLORIDE 0.9 % IV BOLUS (SEPSIS)
1000.0000 mL | Freq: Once | INTRAVENOUS | Status: AC
  Administered 2016-10-07: 1000 mL via INTRAVENOUS

## 2016-10-07 MED ORDER — OXYCODONE-ACETAMINOPHEN 5-325 MG PO TABS
1.0000 | ORAL_TABLET | Freq: Once | ORAL | Status: AC
  Administered 2016-10-07: 1 via ORAL
  Filled 2016-10-07: qty 1

## 2016-10-07 NOTE — ED Provider Notes (Addendum)
[facility]-EMERGENCY DEPT Provider Note   CSN: [id number] Arrival date & time: 10/07/16  2103     History   Chief Complaint Chief Complaint  Patient presents with  . Loss of Consciousness    HPI Kristin [name] is a 41 y.o. female.  Patient is 41 year old female with a history of hypertension, anxiety and depression who presents after syncopal episode. She states that she was at her brother's funeral today and passed out 2-3 times. She was outside in the heat. She does say that she drink 1 beer earlier in the day. She also reported that she took 1 Klonopin early this morning before the general that per EMS, as reported that she may have taken more Klonopin throughout the day. She currently has a headache but she states it feels typical for her normal headaches. She denies any injuries and syncopal episode. She denies any numbness or weakness to extremities other than she's been having some chronic tingling in her feet related to back pain. She was recently diagnosed with a herniated disc. She denies any recent illnesses. She was recently diagnosed with pregnancy. Last menstrual period was July 6. She is not yet followed up with OB. She denies any vaginal bleeding or discharge. No abdominal pain. No chest pain. She states she was having some shortness of breath earlier today but none currently. No palpitations prior to the event.She has bilateral leg swelling which she says she's had in the past but seems to be more today. She denies any calf tenderness. She does report a history of syncope in the past. No fevers coughing colds or other recent illnesses. No vomiting or diarrhea. She's had some nausea which she thought may be related to the pregnancy. She does state she has chronic back pain. She recently was diagnosed with a herniated disc in May. At that time she was having some stool incontinence but she denies having any of those issues currently.  She denies any injuries from the syncopal  event.      Past Medical History:  Diagnosis Date  . Anxiety   . Depression   . Hypertension   . Migraine headache     Patient Active Problem List   Diagnosis Date Noted  . Back pain 08/25/2016  . Generalized anxiety disorder 08/23/2015  . Knee pain 12/21/2014  . Menorrhagia 09/04/2012  . Allergic rhinitis 06/12/2012  . Eczema 03/26/2012  . Alcohol abuse 10/26/2010  . Bipolar 1 disorder (HCC) 10/09/2010  . Panic attacks 10/09/2010  . INSOMNIA, CHRONIC 06/29/2009  . TOBACCO USER 01/23/2009  . Obesity 10/29/2008  . Essential hypertension 12/20/2006    History reviewed. No pertinent surgical history.  OB History    Gravida Para Term Preterm AB Living   4 3 3   1 3    SAB TAB Ectopic Multiple Live Births     1             Home Medications    Prior to Admission medications   Medication Sig Start Date End Date Taking? Authorizing Provider  clonazePAM (KLONOPIN) 0.5 MG tablet Take 1 tablet (0.5 mg total) by mouth 2 (two) times daily as needed for anxiety. 09/08/16   Almon Hercules, MD  clotrimazole-betamethasone (LOTRISONE) cream Apply 1 application topically 2 (two) times daily. 09/07/16   Rumley, Lora Havens, DO  cyclobenzaprine (FLEXERIL) 10 MG tablet Take 1 tablet (10 mg total) by mouth 2 (two) times daily as needed for muscle spasms. 09/21/16   Linwood Dibbles, MD  diclofenac (CATAFLAM) 50 MG tablet Take 1 tablet (50 mg total) by mouth 3 (three) times daily. One tablet TID with food prn pain. Patient taking differently: Take 50 mg by mouth 3 (three) times daily as needed. One tablet TID with food prn pain. 09/16/16   Hayden Rasmussen, NP  lisinopril-hydrochlorothiazide (PRINZIDE,ZESTORETIC) 10-12.5 MG tablet Take 1 tablet by mouth daily. 08/25/16   Almon Hercules, MD  meloxicam (MOBIC) 15 MG tablet Take 0.5 tablets (7.5 mg total) by mouth daily. Patient not taking: Reported on 09/21/2016 08/25/16   Almon Hercules, MD  predniSONE (DELTASONE) 50 MG tablet Take 1 tablet (50 mg total) by  mouth daily with breakfast. Patient not taking: Reported on 09/21/2016 09/07/16   Araceli Bouche, DO    Family History Family History  Problem Relation Age of Onset  . Cancer Maternal Grandmother        Breast  . Hypertension Mother   . Hypertension Brother   . Cancer Maternal Uncle        colon    Social History Social History  Substance Use Topics  . Smoking status: Current Every Day Smoker    Packs/day: 0.25    Years: 5.00  . Smokeless tobacco: Never Used  . Alcohol use Yes     Comment: occassionally      Allergies   Sumatriptan   Review of Systems Review of Systems  Constitutional: Positive for fatigue. Negative for chills, diaphoresis and fever.  HENT: Negative for congestion, rhinorrhea and sneezing.   Eyes: Negative.   Respiratory: Negative for cough, chest tightness and shortness of breath.   Cardiovascular: Positive for leg swelling. Negative for chest pain.  Gastrointestinal: Positive for nausea. Negative for abdominal pain, blood in stool, diarrhea and vomiting.  Genitourinary: Negative for difficulty urinating, flank pain, frequency, hematuria, vaginal bleeding and vaginal discharge.  Musculoskeletal: Positive for back pain (Chronic). Negative for arthralgias.  Skin: Negative for rash.  Neurological: Positive for syncope and numbness (Tingling in both feet). Negative for dizziness, speech difficulty, weakness and headaches.     Physical Exam Updated Vital Signs BP 121/74 (BP Location: Right Arm)   Pulse 91   Temp 98.2 F (36.8 C) (Oral)   Resp (!) 21   LMP 09/15/2016   SpO2 100%   Physical Exam  Constitutional: She is oriented to person, place, and time. She appears well-developed and well-nourished.  HENT:  Head: Normocephalic and atraumatic.  Eyes: Pupils are equal, round, and reactive to light.  Neck: Normal range of motion. Neck supple.  Cardiovascular: Normal rate, regular rhythm and normal heart sounds.   Pulmonary/Chest: Effort normal  and breath sounds normal. No respiratory distress. She has no wheezes. She has no rales. She exhibits no tenderness.  Abdominal: Soft. Bowel sounds are normal. There is no tenderness. There is no rebound and no guarding.  Musculoskeletal: Normal range of motion. She exhibits no edema.  Lymphadenopathy:    She has no cervical adenopathy.  Neurological: She is alert and oriented to person, place, and time.  After she was able to better cooperate with exam, had normal motor strength in all extremities, normal sensation to LT in all extremities.  Skin: Skin is warm and dry. No rash noted.  Psychiatric: She has a normal mood and affect.     ED Treatments / Results  Labs (all labs ordered are listed, but only abnormal results are displayed) Labs Reviewed  BASIC METABOLIC PANEL - Abnormal; Notable for the following:  Result Value   Potassium 3.4 (*)    Calcium 8.6 (*)    All other components within normal limits  CBC - Abnormal; Notable for the following:    WBC 2.5 (*)    RBC 3.72 (*)    Hemoglobin 11.1 (*)    HCT 32.1 (*)    Platelets 137 (*)    All other components within normal limits  ETHANOL - Abnormal; Notable for the following:    Alcohol, Ethyl (B) 392 (*)    All other components within normal limits  CBG MONITORING, ED - Abnormal; Notable for the following:    Glucose-Capillary 106 (*)    All other components within normal limits  URINALYSIS, ROUTINE W REFLEX MICROSCOPIC  RAPID URINE DRUG SCREEN, HOSP PERFORMED  HIV ANTIBODY (ROUTINE TESTING)  CBG MONITORING, ED  I-STAT BETA HCG BLOOD, ED ([facility], [facility], [facility] ONLY)    EKG  EKG Interpretation  Date/Time:  Saturday October 07 2016 21:19:35 EDT Ventricular Rate:  94 PR Interval:    QRS Duration: 95 QT Interval:  359 QTC Calculation: 449 R Axis:   72 Text Interpretation:  Sinus rhythm since last tracing no significant change Confirmed by Rolan Bucco [phone]) on 10/08/2016 12:20:33 AM       Radiology No results  found.  Procedures Procedures (including critical care time)  Medications Ordered in ED Medications  sodium chloride 0.9 % bolus 1,000 mL (1,000 mLs Intravenous New Bag/Given 10/07/16 2200)  oxyCODONE-acetaminophen (PERCOCET/ROXICET) 5-325 MG per tablet 1 tablet (1 tablet Oral Given 10/07/16 2217)     Initial Impression / Assessment and Plan / ED Course  I have reviewed the triage vital signs and the nursing notes.  Pertinent labs & imaging results that were available during my care of the patient were reviewed by me and considered in my medical decision making (see chart for details).     21:30 On initial evaluation, patient was not responding to verbal stimuli. A sternal rub was performed. She woke up and stated that she would "kick my ass"  if I touch her like that again. I did advise her that threatening staff was inappropriate and that we were trying to take care of her. She later apologized.  Patient is given IV fluids. She has normal vital signs. Her alcohol level came back at 390. I advised a period of observation the patient is refusing. She is adamant about leaving. She is going home with family members including her sister who is here. She's able to ambulate unassisted. She is oriented 3. She has no current complaints. She left AMA.  Final Clinical Impressions(s) / ED Diagnoses   Final diagnoses:  Syncope and collapse  Alcoholic intoxication without complication [facility])    New Prescriptions New Prescriptions   No medications on file     Rolan Bucco, MD 10/08/16 Deliah Boston, MD 10/08/16 1210

## 2016-10-07 NOTE — ED Notes (Signed)
Bed: WA17 Expected date:  Expected time:  Means of arrival:  Comments: EMS 41 yo female syncope x 2 today

## 2016-10-07 NOTE — ED Notes (Signed)
Pt states she cannot void at the moment for urine sample and did not want to try. Writer states she will go back into room once pt has had fluids and ask again for urine sample.

## 2016-10-07 NOTE — ED Triage Notes (Signed)
Pt BIB EMS for syncope x2. Pupils are constricted but reactive. Patient buried her brother today, states she only had one beer. Denies drug use. States she took a clonopin this morning before the funeral. Family on scene states she had 2-3 syncopal episodes. Pt lethargic and somnolent, no airway compromise. Easily arousable to voice.

## 2016-10-07 NOTE — ED Notes (Signed)
RN going to draw blood from IV for blood specimen. Pt still stating she cannot urinate for urine sample.

## 2016-10-30 ENCOUNTER — Encounter: Payer: Self-pay | Admitting: Family Medicine

## 2016-10-30 ENCOUNTER — Ambulatory Visit (INDEPENDENT_AMBULATORY_CARE_PROVIDER_SITE_OTHER): Payer: BLUE CROSS/BLUE SHIELD | Admitting: Family Medicine

## 2016-10-30 DIAGNOSIS — F319 Bipolar disorder, unspecified: Secondary | ICD-10-CM | POA: Diagnosis not present

## 2016-10-30 DIAGNOSIS — F411 Generalized anxiety disorder: Secondary | ICD-10-CM

## 2016-10-30 MED ORDER — CLONAZEPAM 0.5 MG PO TABS: 0.5000 mg | ORAL_TABLET | Freq: Two times a day (BID) | ORAL | 0 refills | Status: DC | PRN

## 2016-10-30 NOTE — Progress Notes (Signed)
D

## 2016-10-30 NOTE — Progress Notes (Signed)
    Subjective:  Kristin [name] is a 41 y.o. female who presents to the [facility] today with complaint of depressed mood  HPI:  Kristin [name] is a 41yo F with complicated psychiatric history including substance abuse, untreated bipolar one disorder, anxiety and depression.  She is also currently pregnant, but states she plans to terminate the pregnancy. Reportedly her brother was murdered about one month ago and she has been having trouble sleeping and feels depressed. Denies current drug use or alcohol use.  When asked specicically about alcohol abuse given her history, she states that she has not had a drink in one year.  However, chart review shows that she presented to the ED on 7/28 and had an ethanol level of 392.  Currently denies SOB, chest pain, NVD, changes in vision or headaches.  Recently seen by Dr. Alanda Slim at the end of June and was given klonopin 0.5mg  BID PRN and was dispensed 60 pills.  She is currently out of medication and requesting more today.  Reports she is unlikely to hurt herself in next week.  Never hospitalized for mental health issues.  Was previously on latuda for bipolar and has not been on it for at least 2 years.  She was recently incarcerated and reports that for that reason she has not been doing anyone in psychiatry.  She is open to following up with [facility] or someone at [facility] behavioral health in the future.   PMH: bipolar 1 disorder, tobacco use, alcohol abuse, generalized anxiety disorder Tobacco use: current smoker Medication: reviewed and updated ROS: see HPI   Objective:  Physical Exam: BP (!) 148/106   Pulse 75   Temp 98.5 F (36.9 C) (Oral)   Ht 5' 4.5" (1.638 m)   Wt 199 lb 3.2 oz (90.4 kg)   LMP 10/05/2016 (Approximate)   SpO2 98%   BMI 33.66 kg/m   Gen: 40yo F in NAD, resting comfortably, but appears irritated CV: RRR with no murmurs appreciated Pulm: NWOB, CTAB with no crackles, wheezes, or rhonchi GI: Normal bowel sounds present. Soft, Nontender,  Nondistended. MSK: no edema, cyanosis, or clubbing noted Skin: warm, dry Neuro: grossly normal, moves all extremities Psych: flat affect, appears irritated.  No results found for this or any previous visit (from the past 72 hour(s)).   Assessment/Plan:  Generalized anxiety disorder Complex patient with multiple untreated psychiatric issues currently on benzodiazepine klonopin 0.5mg  BID. Her current level of anxiety is likely heightened by her untreated bipolar disorder and recent death of her brother. Benzodiazepine is not the most appropriate medication to be on.  GIven that patient has been taking this daily for past month, there is concern for possible withdrawal. Patient's PCP wants to ultimately wean her off this per our discussion.  However, he is ok with one refill and wants to follow up with her from there.  She also desperately needs to be seen by psychiatrist.  - refilled klonopin 0.5mg  BID PRN dispensed 60 pills  - follow up with Dr. Alanda Slim - recommended patient call [facility]

## 2016-10-30 NOTE — Patient Instructions (Addendum)
[place] you were seen today for medication refill.  Please follow up with Dr. Alanda Slim.  As we discussed, if you are having any thoughts of hurting yourself or others you need to call 911.  I am recommending that you call [facility] at [phone] to be seen by a Psychiatrist.    It was very nice seeing you today, Kristin [name] L. Myrtie Soman, MD [facility] Family Medicine Resident PGY-2 10/30/2016 3:59 PM

## 2016-11-01 NOTE — Assessment & Plan Note (Signed)
Complex patient with multiple untreated psychiatric issues currently on benzodiazepine klonopin 0.5mg  BID. Her current level of anxiety is likely heightened by her untreated bipolar disorder and recent death of her brother. Benzodiazepine is not the most appropriate medication to be on.  GIven that patient has been taking this daily for past month, there is concern for possible withdrawal. Patient's PCP wants to ultimately wean her off this per our discussion.  However, he is ok with one refill and wants to follow up with her from there.  She also desperately needs to be seen by psychiatrist.  - refilled klonopin 0.5mg  BID PRN dispensed 60 pills  - follow up with Dr. Alanda Slim - recommended patient call [facility]

## 2016-11-26 ENCOUNTER — Inpatient Hospital Stay (HOSPITAL_COMMUNITY)
Admission: AD | Admit: 2016-11-26 | Discharge: 2016-11-26 | Disposition: A | Discharge status: Home / Self Care | Payer: BLUE CROSS/BLUE SHIELD | Source: Ambulatory Visit | Attending: Obstetrics and Gynecology | Admitting: Obstetrics and Gynecology

## 2016-11-26 DIAGNOSIS — I10 Essential (primary) hypertension: Secondary | ICD-10-CM | POA: Insufficient documentation

## 2016-11-26 DIAGNOSIS — Z79899 Other long term (current) drug therapy: Secondary | ICD-10-CM | POA: Insufficient documentation

## 2016-11-26 DIAGNOSIS — F419 Anxiety disorder, unspecified: Secondary | ICD-10-CM | POA: Insufficient documentation

## 2016-11-26 DIAGNOSIS — F329 Major depressive disorder, single episode, unspecified: Secondary | ICD-10-CM | POA: Insufficient documentation

## 2016-11-26 DIAGNOSIS — N939 Abnormal uterine and vaginal bleeding, unspecified: Secondary | ICD-10-CM

## 2016-11-26 DIAGNOSIS — F17210 Nicotine dependence, cigarettes, uncomplicated: Secondary | ICD-10-CM | POA: Insufficient documentation

## 2016-11-26 LAB — WET PREP, GENITAL
Sperm: NONE SEEN
Trich, Wet Prep: NONE SEEN
WBC WET PREP: NONE SEEN
Yeast Wet Prep HPF POC: NONE SEEN

## 2016-11-26 LAB — URINALYSIS, ROUTINE W REFLEX MICROSCOPIC
Bilirubin Urine: NEGATIVE
GLUCOSE, UA: NEGATIVE mg/dL
KETONES UR: NEGATIVE mg/dL
LEUKOCYTES UA: NEGATIVE
Nitrite: NEGATIVE
PROTEIN: NEGATIVE mg/dL
Specific Gravity, Urine: 1.002 — ABNORMAL LOW (ref 1.005–1.030)
WBC, UA: NONE SEEN WBC/hpf (ref 0–5)
pH: 6 (ref 5.0–8.0)

## 2016-11-26 LAB — HCG, QUANTITATIVE, PREGNANCY: hCG, Beta Chain, Quant, S: <1 m[IU]/mL (ref <5)

## 2016-11-26 LAB — CBC
HCT: 35.2 % — ABNORMAL LOW (ref 36.0–46.0)
Hemoglobin: 12.4 g/dL (ref 12.0–15.0)
MCH: 30.6 pg (ref 26.0–34.0)
MCHC: 35.2 g/dL (ref 30.0–36.0)
MCV: 86.9 fL (ref 78.0–100.0)
PLATELETS: 131 10*3/uL — AB (ref 150–400)
RBC: 4.05 MIL/uL (ref 3.87–5.11)
RDW: 15.9 % — AB (ref 11.5–15.5)
WBC: 3.5 10*3/uL — ABNORMAL LOW (ref 4.0–10.5)

## 2016-11-26 LAB — POCT PREGNANCY, URINE: Preg Test, Ur: NEGATIVE

## 2016-11-26 MED ORDER — MEGESTROL ACETATE 40 MG PO TABS: 40.0000 mg | ORAL_TABLET | Freq: Two times a day (BID) | ORAL | 2 refills | Status: DC

## 2016-11-26 MED ORDER — KETOROLAC TROMETHAMINE 60 MG/2ML IM SOLN
60.0000 mg | Freq: Once | INTRAMUSCULAR | Status: AC
  Administered 2016-11-26: 60 mg via INTRAMUSCULAR
  Filled 2016-11-26: qty 2

## 2016-11-26 NOTE — MAU Note (Signed)
+  vaginal bleeding with clots--nickel in size Spotting on Sunday 9/9; went away and then started bleeding again States having to wear a tampon +HPT  +lower abdominal cramping Rating pain 10/10

## 2016-11-26 NOTE — Progress Notes (Addendum)
G4P3 non-pregnant pt presents to triage for vaginal bleeding with dime size clots since last Sunday. Says bleeding stopped and now bleeding again. Pt states +HPT. UPT done and is negative here.   1830: up to bathroom   1854: pt getting inpatient. Asking what's taking so long to be seen. Provider made aware.   1907: Provider at bs assessing. GC, wet prep, and pelvic exam done.   2055: Discharge instructions given with pt understanding. Pt left unit via ambulatory with friend.

## 2016-11-26 NOTE — MAU Provider Note (Signed)
Chief Complaint: Vaginal Bleeding   First Provider Initiated Contact with Patient 11/26/16 1858      SUBJECTIVE HPI: Kristin [name] is a 41 y.o. [id number] who presents to maternity admissions reporting heavy vaginal bleeding, irregular menses. She reports she had a positive pregnancy test and then a negative test in [facility] and [facility] back in July.  She has not taken another test at home.  She reports her periods have always been irregular, but this month she reports her period started, then stopped, then when bleeding started again 1 week later, it was heavy with clots. Her bleeding is associated with fatigue. She denies dizziness, h/a, palpitations.  She has history of HTN and is on medication but did not take her medication. She admits to a lot of stress in recent months since her brother was killed. She is unable to give a timeline of her health history in the last few months, like when she had ED visits, because of her stress. She denies vaginal itching/burning, urinary symptoms, h/a, dizziness, n/v, or fever/chills.     HPI  Past Medical History:  Diagnosis Date  . Anxiety   . Depression   . Hypertension   . Migraine headache    No past surgical history on file. Social History   Social History  . Marital status: Single    Spouse name: N/A  . Number of children: N/A  . Years of education: N/A   Occupational History  . Not on file.   Social History Main Topics  . Smoking status: Current Every Day Smoker    Packs/day: 0.25    Years: 5.00  . Smokeless tobacco: Never Used  . Alcohol use Yes     Comment: occassionally   . Drug use: No  . Sexual activity: Not Currently    Birth control/ protection: None   Other Topics Concern  . Not on file   Social History Narrative  . No narrative on file   No current facility-administered medications on file prior to encounter.    Current Outpatient Prescriptions on File Prior to Encounter  Medication Sig Dispense Refill  . clonazePAM  (KLONOPIN) 0.5 MG tablet Take 1 tablet (0.5 mg total) by mouth 2 (two) times daily as needed for anxiety. 60 tablet 0  . clotrimazole-betamethasone (LOTRISONE) cream Apply 1 application topically 2 (two) times daily. 30 g 0  . cyclobenzaprine (FLEXERIL) 10 MG tablet Take 1 tablet (10 mg total) by mouth 2 (two) times daily as needed for muscle spasms. 20 tablet 0  . diclofenac (CATAFLAM) 50 MG tablet Take 1 tablet (50 mg total) by mouth 3 (three) times daily. One tablet TID with food prn pain. (Patient taking differently: Take 50 mg by mouth 3 (three) times daily as needed. One tablet TID with food prn pain.) 21 tablet 0  . lisinopril-hydrochlorothiazide (PRINZIDE,ZESTORETIC) 10-12.5 MG tablet Take 1 tablet by mouth daily. 90 tablet 3  . meloxicam (MOBIC) 15 MG tablet Take 0.5 tablets (7.5 mg total) by mouth daily. (Patient not taking: Reported on 09/21/2016) 30 tablet 0  . predniSONE (DELTASONE) 50 MG tablet Take 1 tablet (50 mg total) by mouth daily with breakfast. (Patient not taking: Reported on 09/21/2016) 5 tablet 0   Allergies  Allergen Reactions  . Sumatriptan Anaphylaxis    ROS:  Review of Systems  Constitutional: Positive for fatigue. Negative for chills and fever.  Respiratory: Negative for shortness of breath.   Cardiovascular: Negative for chest pain.  Gastrointestinal: Negative for nausea and vomiting.  Genitourinary: Positive for pelvic pain and vaginal bleeding. Negative for difficulty urinating, dysuria, flank pain, vaginal discharge and vaginal pain.  Neurological: Negative for dizziness and headaches.  Psychiatric/Behavioral: Negative.      I have reviewed patient's Past Medical Hx, Surgical Hx, Family Hx, Social Hx, medications and allergies.   Physical Exam   Patient Vitals for the past 24 hrs:  BP Temp Temp src Pulse Resp SpO2  11/26/16 1954 124/78 98 F (36.7 C) Oral 75 18 -  11/26/16 1819 (!) 139/94 - - 97 - -  11/26/16 1804 (!) 151/99 98 F (36.7 C) Oral 96 18  100 %   Constitutional: Well-developed, well-nourished female in no acute distress.  Cardiovascular: normal rate Respiratory: normal effort GI: Abd soft, non-tender. Pos BS x 4 MS: Extremities nontender, no edema, normal ROM Neurologic: Alert and oriented x 4.  GU: Neg CVAT.  PELVIC EXAM: Cervix pink, visually closed, without lesion, small amount dark red bleeding, vaginal walls and external genitalia normal Bimanual exam: Cervix 0/long/high, firm, anterior, neg CMT, uterus nontender, nonenlarged, adnexa without tenderness, enlargement, or mass   LAB RESULTS Results for orders placed or performed during the hospital encounter of 11/26/16 (from the past 24 hour(s))  Urinalysis, Routine w reflex microscopic     Status: Abnormal   Collection Time: 11/26/16  5:54 PM  Result Value Ref Range   Color, Urine STRAW (A) YELLOW   APPearance CLEAR CLEAR   Specific Gravity, Urine 1.002 (L) 1.005 - 1.030   pH 6.0 5.0 - 8.0   Glucose, UA NEGATIVE NEGATIVE mg/dL   Hgb urine dipstick MODERATE (A) NEGATIVE   Bilirubin Urine NEGATIVE NEGATIVE   Ketones, ur NEGATIVE NEGATIVE mg/dL   Protein, ur NEGATIVE NEGATIVE mg/dL   Nitrite NEGATIVE NEGATIVE   Leukocytes, UA NEGATIVE NEGATIVE   RBC / HPF 0-5 0 - 5 RBC/hpf   WBC, UA NONE SEEN 0 - 5 WBC/hpf   Bacteria, UA RARE (A) NONE SEEN   Squamous Epithelial / LPF 0-5 (A) NONE SEEN  Pregnancy, urine POC     Status: None   Collection Time: 11/26/16  6:05 PM  Result Value Ref Range   Preg Test, Ur NEGATIVE NEGATIVE  Wet prep, genital     Status: Abnormal   Collection Time: 11/26/16  7:10 PM  Result Value Ref Range   Yeast Wet Prep HPF POC NONE SEEN NONE SEEN   Trich, Wet Prep NONE SEEN NONE SEEN   Clue Cells Wet Prep HPF POC PRESENT (A) NONE SEEN   WBC, Wet Prep HPF POC NONE SEEN NONE SEEN   Sperm NONE SEEN   CBC     Status: Abnormal   Collection Time: 11/26/16  7:21 PM  Result Value Ref Range   WBC 3.5 (L) 4.0 - 10.5 K/uL   RBC 4.05 3.87 - 5.11  MIL/uL   Hemoglobin 12.4 12.0 - 15.0 g/dL   HCT 91.4 (L) 78.2 - 95.6 %   MCV 86.9 78.0 - 100.0 fL   MCH 30.6 26.0 - 34.0 pg   MCHC 35.2 30.0 - 36.0 g/dL   RDW 21.3 (H) 08.6 - 57.8 %   Platelets 131 (L) 150 - 400 K/uL       IMAGING No results found.  MAU Management/MDM: Ordered labs and reviewed results. No evidence of acute abdomen, Hgb normal.  Megace 40 mg BID with follow up in [facility].  Treatments in MAU included Toradol 60 mg IM x 1 dose. Pt stable at time of  discharge.  ASSESSMENT 1. Abnormal uterine bleeding (AUB)     PLAN Discharge home Allergies as of 11/26/2016      Reactions   Sumatriptan Anaphylaxis      Medication List    TAKE these medications   clonazePAM 0.5 MG tablet Commonly known as:  KLONOPIN Take 1 tablet (0.5 mg total) by mouth 2 (two) times daily as needed for anxiety.   clotrimazole-betamethasone cream Commonly known as:  LOTRISONE Apply 1 application topically 2 (two) times daily.   cyclobenzaprine 10 MG tablet Commonly known as:  FLEXERIL Take 1 tablet (10 mg total) by mouth 2 (two) times daily as needed for muscle spasms.   diclofenac 50 MG tablet Commonly known as:  CATAFLAM Take 1 tablet (50 mg total) by mouth 3 (three) times daily. One tablet TID with food prn pain. What changed:  when to take this  reasons to take this  additional instructions   lisinopril-hydrochlorothiazide 10-12.5 MG tablet Commonly known as:  PRINZIDE,ZESTORETIC Take 1 tablet by mouth daily.   megestrol 40 MG tablet Commonly known as:  MEGACE Take 1 tablet (40 mg total) by mouth 2 (two) times daily.   meloxicam 15 MG tablet Commonly known as:  MOBIC Take 0.5 tablets (7.5 mg total) by mouth daily.   predniSONE 50 MG tablet Commonly known as:  DELTASONE Take 1 tablet (50 mg total) by mouth daily with breakfast.            Discharge Care Instructions        Start     Ordered   11/26/16 0000  megestrol (MEGACE) 40 MG tablet  2 times daily     Question:  Supervising Provider  Answer:  Willodean Rosenthal   11/26/16 1952   11/26/16 0000  Discharge patient    Question Answer Comment  Discharge disposition 01-Home or Self Care   Discharge patient date 11/26/2016      11/26/16 1952     Follow-up Information    [facility] Follow up.   Specialty:  Obstetrics and Gynecology Why:  The office will call you with a follow up appointment. Return to MAU as needed for emergencies. Contact information: [address] [place] [address] [phone]          Sharen Counter Certified Nurse-Midwife 11/26/2016  8:21 PM

## 2016-11-27 LAB — GC/CHLAMYDIA PROBE AMP (~~LOC~~) NOT AT ARMC
Chlamydia: NEGATIVE
Neisseria Gonorrhea: NEGATIVE

## 2016-12-04 ENCOUNTER — Encounter: Payer: Self-pay | Admitting: Family Medicine

## 2016-12-05 ENCOUNTER — Encounter: Payer: Self-pay | Admitting: Student

## 2016-12-05 ENCOUNTER — Ambulatory Visit (INDEPENDENT_AMBULATORY_CARE_PROVIDER_SITE_OTHER): Payer: BLUE CROSS/BLUE SHIELD | Admitting: Student

## 2016-12-05 ENCOUNTER — Telehealth: Payer: Self-pay | Admitting: *Deleted

## 2016-12-05 VITALS — BP 108/68 | HR 69 | Temp 98.5°F | Ht 64.5 in | Wt 196.0 lb

## 2016-12-05 DIAGNOSIS — M5442 Lumbago with sciatica, left side: Secondary | ICD-10-CM | POA: Diagnosis not present

## 2016-12-05 DIAGNOSIS — G8929 Other chronic pain: Secondary | ICD-10-CM

## 2016-12-05 DIAGNOSIS — Z111 Encounter for screening for respiratory tuberculosis: Secondary | ICD-10-CM

## 2016-12-05 DIAGNOSIS — M5441 Lumbago with sciatica, right side: Secondary | ICD-10-CM | POA: Diagnosis not present

## 2016-12-05 MED ORDER — GABAPENTIN 300 MG PO CAPS: 300.0000 mg | ORAL_CAPSULE | Freq: Every day | ORAL | 3 refills | Status: DC

## 2016-12-05 MED ORDER — DICLOFENAC SODIUM 1 % TD GEL: 4.0000 g | Freq: Four times a day (QID) | TRANSDERMAL | 3 refills | Status: DC

## 2016-12-05 NOTE — Progress Notes (Signed)
Subjective:    Kristin [name] is a 41 y.o. old female here for back pain  HPI  Back pain: this has been going on for many years. Her back pain has been the same for the last three months but the pain is getting worse. She describes the pain as stabbing. Pain radiates down to her legs to anterior aspect of her thighs. She also reports numbness in her big toes bilaterally. She also reports balance issues especially in the morning.  No history of trauma or injury. She had epidural for delivery may years. She fell 4 days ago. Denies hitting her head and LOC. She falls about twice a month.  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 She did a lot of lifting in the past but not currently. She tried oral NSAID and steroid in the past.   Patient had lumbar MRI 4 months ago that showed moderate L4-5 disc protrusion resulting in minimal canal stenosis and displacement of the traversing LEFT L5 nerve, as well as mild to moderate LEFT L4-5 neural foraminal narrowing. Patient denies fever, saddle anesthesia, bowel or bladder issue, unintentional weight loss or night sweat.  Patient headache history of bipolar disorder. She denies smoking, drinking or recreational drug use.  PMH/Problem List: has Obesity; TOBACCO USER; INSOMNIA, CHRONIC; Essential hypertension; Bipolar 1 disorder (HCC); Panic attacks; Alcohol abuse; Eczema; Allergic rhinitis; Menorrhagia; Knee pain; Generalized anxiety disorder; and Back pain on her problem list.   has a past medical history of Anxiety; Depression; Hypertension; and Migraine headache.  FH:  Family History  Problem Relation Age of Onset  . Cancer Maternal Grandmother        Breast  . Hypertension Mother   . Hypertension Brother   . Cancer Maternal Uncle        colon    SH Social History  Substance Use Topics  . Smoking status: Current Every Day Smoker    Packs/day: 0.25    Years: 5.00  . Smokeless tobacco: Never Used  . Alcohol use Yes     Comment: occassionally      Review of Systems Review of systems negative except for pertinent positives and negatives in history of present illness above.     Objective:     Vitals:   12/05/16 1200  BP: 108/68  Pulse: 69  Temp: 98.5 F (36.9 C)  TempSrc: Oral  SpO2: 97%  Weight: 196 lb (88.9 kg)  Height: 5' 4.5" (1.638 m)   Body mass index is 33.12 kg/m.  Physical Exam GEN: appears well, obese, no apparent distress. CVS: RRR, nl S1&S2, no murmurs, no edema RESP: no IWOB, good air movement bilaterally, CTAB GI: BS present & normal, soft, NTND MSK: Back and LEs  Normal skin, spine with normal alignment and no deformity. Both legs appear symmetric  No step offs. Exaggerated response to mild palpation all over her back.     ROM hard to assess due patients poor effort  Lying and seated SLR negative  Neuro exam in LE: motor 4/5 in all muscle groups of upper and lower extremity partly due to poor effort. , light sensation intact in L4-S1 dermatomes. I wasn't able to illicit patellar reflex bilaterally even with distraction. Proprioception intact. Gait slow and short stance. Wasn't able to walk heal to toe or tip toe.  SKIN: no apparent skin lesion NEURO: as above PSYCH: appropriate affect, no SI/HI    Assessment and Plan:  1. Chronic bilateral low back pain with bilateral sciatica: Her exam is  unreliable due to patient's poor effort. Obviously, her weight has huge role to play. Patient with known canal and foraminal stenosis as seen on recent MRI 4 months ago that suggests some amount of radiculopathy. I thinks she would benefit from referral to neurosurgery. Meanwhile, will treat pain with Voltaren gel and gabapentin. Warned about the side effect of the latter. Part of her pain could also be a somatic manifestation of her underlying psychiatric condition. No red flag for cauda equina, malignancy or fracture. - diclofenac sodium (VOLTAREN) 1 % GEL; Apply 4 g topically 4 (four) times daily.  Dispense:  400 g; Refill: 3 - gabapentin (NEURONTIN) 300 MG capsule; Take 1 capsule (300 mg total) by mouth at bedtime. You may take up to 3 times a day if no improvement.  Dispense: 90 capsule; Refill: 3 - Ambulatory referral to Neurosurgery  2. 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 She understands that she has to return in 48-72 hours for reading - PPD  Return if symptoms worsen or fail to improve.  Almon Hercules, MD 12/06/16 Pager: [phone]

## 2016-12-05 NOTE — Patient Instructions (Signed)
It was great seeing you today! We have addressed the following issues today 1. Back pain: We have sent a prescription for Voltaren gel and gabapentin to your pharmacy. The later might make you drowsy and sedated. Please be cautious if you are driving. We have also sent a referral to neurosurgery. Someone will get in touch with you about this referring the next couple of weeks.  If we did any lab work today, and the results require attention, either me or my nurse will get in touch with you. If everything is normal, you will get a letter in mail and a message via . If you don't hear from [place] in two weeks, please give [place] a call. Otherwise, we look forward to seeing you again at your next visit. If you have any questions or concerns before then, please call the clinic at [phone].  Please bring all your medications to every doctors visit  Sign up for My Chart to have easy access to your labs results, and communication with your Primary care physician.    Please check-out at the front desk before leaving the clinic.    Take Care,   Dr. Alanda Slim

## 2016-12-05 NOTE — Telephone Encounter (Signed)
Prior Authorization received from [facility] for diclofenac 1% gel.  PA form placed in provider box for completion. Clovis Pu, RN

## 2016-12-06 NOTE — Telephone Encounter (Signed)
Completed, and signed PA form for Diclofenac gel.  Note routed to RN team inbasket and placed completed form in Clinic RN's office (wall pocket above desk).  Almon Hercules, MD

## 2016-12-07 ENCOUNTER — Ambulatory Visit (INDEPENDENT_AMBULATORY_CARE_PROVIDER_SITE_OTHER): Payer: BLUE CROSS/BLUE SHIELD | Admitting: *Deleted

## 2016-12-07 DIAGNOSIS — Z111 Encounter for screening for respiratory tuberculosis: Secondary | ICD-10-CM

## 2016-12-07 LAB — TB SKIN TEST
INDURATION: 0 mm
TB Skin Test: NEGATIVE

## 2016-12-07 NOTE — Progress Notes (Signed)
   PPD Reading Note PPD read and results entered in Epic. Result: 0 mm induration. Interpretation: Negative Allergic reaction: no Result letter given to pt L. Leward Quan, RN, BSN

## 2016-12-12 NOTE — Telephone Encounter (Signed)
PA for diclofenac 1% was denied via BCBS of [place]. Medication can be approved to treat osteoarthritis of elbow, wrist, hand, knee, ankle or foot. Reference number: [id number].  Clovis Pu, RN

## 2016-12-13 NOTE — Telephone Encounter (Signed)
Called and talked to patient about the PA denial on her diclofenac gel. Discussed about other options including physical therapy. She is interested in this but likes to weight on input from neurosurgery. She has a referral placed at her recent visit with me. She will let me know once she sees them.

## 2016-12-15 ENCOUNTER — Inpatient Hospital Stay (HOSPITAL_COMMUNITY)
Admission: AD | Admit: 2016-12-15 | Discharge: 2016-12-15 | Disposition: A | Discharge status: Home / Self Care | Payer: BLUE CROSS/BLUE SHIELD | Source: Ambulatory Visit | Attending: Obstetrics & Gynecology | Admitting: Obstetrics & Gynecology

## 2016-12-15 ENCOUNTER — Encounter (HOSPITAL_COMMUNITY): Payer: Self-pay | Admitting: *Deleted

## 2016-12-15 DIAGNOSIS — N939 Abnormal uterine and vaginal bleeding, unspecified: Secondary | ICD-10-CM

## 2016-12-15 DIAGNOSIS — Z3202 Encounter for pregnancy test, result negative: Secondary | ICD-10-CM | POA: Diagnosis not present

## 2016-12-15 DIAGNOSIS — F17210 Nicotine dependence, cigarettes, uncomplicated: Secondary | ICD-10-CM | POA: Diagnosis not present

## 2016-12-15 DIAGNOSIS — F419 Anxiety disorder, unspecified: Secondary | ICD-10-CM | POA: Insufficient documentation

## 2016-12-15 LAB — CBC
HCT: 33.5 % — ABNORMAL LOW (ref 36.0–46.0)
Hemoglobin: 11.5 g/dL — ABNORMAL LOW (ref 12.0–15.0)
MCH: 30.7 pg (ref 26.0–34.0)
MCHC: 34.3 g/dL (ref 30.0–36.0)
MCV: 89.3 fL (ref 78.0–100.0)
PLATELETS: 151 10*3/uL (ref 150–400)
RBC: 3.75 MIL/uL — ABNORMAL LOW (ref 3.87–5.11)
RDW: 15.5 % (ref 11.5–15.5)
WBC: 4.7 10*3/uL (ref 4.0–10.5)

## 2016-12-15 LAB — POCT PREGNANCY, URINE: Preg Test, Ur: NEGATIVE

## 2016-12-15 MED ORDER — MEGESTROL ACETATE 40 MG PO TABS: 80.0000 mg | ORAL_TABLET | Freq: Two times a day (BID) | ORAL | 1 refills | Status: DC

## 2016-12-15 NOTE — MAU Note (Signed)
Urine in lab 

## 2016-12-15 NOTE — MAU Provider Note (Signed)
History     CSN: [id number]  Arrival date and time: 12/15/16 1408   First Provider Initiated Contact with Patient 12/15/16 1449      Chief Complaint  Patient presents with  . Vaginal Bleeding   Non-pregnant female here with heavy VB. Bleeding started about 3 weeks ago. She reports saturating a pad and tampon hourly, every day. Associated sx are uterine cramping. She was seen 2 weeks ago in MAU for the bleeding and given Megace which she states hasn't helped. She is sexually active but not in the last 1.5 months. She is not using contraception.    Past Medical History:  Diagnosis Date  . Anxiety   . Depression   . Hypertension   . Migraine headache     Past Surgical History:  Procedure Laterality Date  . NO PAST SURGERIES      Family History  Problem Relation Age of Onset  . Cancer Maternal Grandmother        Breast  . Hypertension Mother   . Hypertension Brother   . Cancer Maternal Uncle        colon    Social History  Substance Use Topics  . Smoking status: Current Every Day Smoker    Packs/day: 0.25    Years: 5.00  . Smokeless tobacco: Never Used  . Alcohol use Yes     Comment: occassionally     Allergies:  Allergies  Allergen Reactions  . Sumatriptan Anaphylaxis    Prescriptions Prior to Admission  Medication Sig Dispense Refill Last Dose  . clonazePAM (KLONOPIN) 0.5 MG tablet Take 1 tablet (0.5 mg total) by mouth 2 (two) times daily as needed for anxiety. 60 tablet 0   . clotrimazole-betamethasone (LOTRISONE) cream Apply 1 application topically 2 (two) times daily. 30 g 0 Taking  . cyclobenzaprine (FLEXERIL) 10 MG tablet Take 1 tablet (10 mg total) by mouth 2 (two) times daily as needed for muscle spasms. 20 tablet 0 Taking  . diclofenac (CATAFLAM) 50 MG tablet Take 1 tablet (50 mg total) by mouth 3 (three) times daily. One tablet TID with food prn pain. (Patient taking differently: Take 50 mg by mouth 3 (three) times daily as needed. One tablet TID  with food prn pain.) 21 tablet 0 Taking  . diclofenac sodium (VOLTAREN) 1 % GEL Apply 4 g topically 4 (four) times daily. 400 g 3   . gabapentin (NEURONTIN) 300 MG capsule Take 1 capsule (300 mg total) by mouth at bedtime. You may take up to 3 times a day if no improvement. 90 capsule 3   . lisinopril-hydrochlorothiazide (PRINZIDE,ZESTORETIC) 10-12.5 MG tablet Take 1 tablet by mouth daily. 90 tablet 3 Taking  . megestrol (MEGACE) 40 MG tablet Take 1 tablet (40 mg total) by mouth 2 (two) times daily. 60 tablet 2   . meloxicam (MOBIC) 15 MG tablet Take 0.5 tablets (7.5 mg total) by mouth daily. (Patient not taking: Reported on 09/21/2016) 30 tablet 0 Completed Course at Unknown time    Review of Systems  Gastrointestinal: Positive for abdominal pain.  Genitourinary: Positive for vaginal bleeding.   Physical Exam   Blood pressure 118/79, pulse (!) 102, temperature 98.4 F (36.9 C), temperature source Oral, resp. rate 16, height 5' 4.5" (1.638 m), weight 193 lb 8 oz (87.8 kg), last menstrual period 12/05/2016, SpO2 98 %.  Physical Exam  Constitutional: She is oriented to person, place, and time. She appears well-developed and well-nourished. No distress.  HENT:  Head: Normocephalic and atraumatic.  Neck: Normal range of motion.  Cardiovascular: Normal rate.   Respiratory: Effort normal.  GI: Soft. She exhibits no distension and no mass. There is no tenderness. There is no rebound and no guarding.  Genitourinary:  Genitourinary Comments: External: no lesions or erythema Vagina: rugated, pink, moist, scant bloody discharge Uterus: non enlarged, anteverted, non tender, no CMT Adnexae: no masses, no tenderness left, no tenderness right   Musculoskeletal: Normal range of motion.  Neurological: She is alert and oriented to person, place, and time.  Skin: Skin is warm and dry.  Psychiatric: She has a normal mood and affect.   Results for orders placed or performed during the hospital  encounter of 12/15/16 (from the past 24 hour(s))  CBC     Status: Abnormal   Collection Time: 12/15/16  2:50 PM  Result Value Ref Range   WBC 4.7 4.0 - 10.5 K/uL   RBC 3.75 (L) 3.87 - 5.11 MIL/uL   Hemoglobin 11.5 (L) 12.0 - 15.0 g/dL   HCT 15.3 (L) 79.4 - 32.7 %   MCV 89.3 78.0 - 100.0 fL   MCH 30.7 26.0 - 34.0 pg   MCHC 34.3 30.0 - 36.0 g/dL   RDW 61.4 70.9 - 29.5 %   Platelets 151 150 - 400 K/uL  Pregnancy, urine POC     Status: None   Collection Time: 12/15/16  3:19 PM  Result Value Ref Range   Preg Test, Ur NEGATIVE NEGATIVE   MAU Course  Procedures  MDM Labs ordered and reviewed. No evidence of anemia. Will increase Megace and order outpt pelvic US. Pt has appt scheduled in [facility] for follow up. Stable for discharge home.  Assessment and Plan   1. Abnormal uterine bleeding (AUB)    Discharge home Follow up in [facility] as scheduled Rx Megace Ibuprofen or Aleve prn  Allergies as of 12/15/2016      Reactions   Sumatriptan Anaphylaxis      Medication List    TAKE these medications   clonazePAM 0.5 MG tablet Commonly known as:  KLONOPIN Take 1 tablet (0.5 mg total) by mouth 2 (two) times daily as needed for anxiety.   clotrimazole-betamethasone cream Commonly known as:  LOTRISONE Apply 1 application topically 2 (two) times daily.   cyclobenzaprine 10 MG tablet Commonly known as:  FLEXERIL Take 1 tablet (10 mg total) by mouth 2 (two) times daily as needed for muscle spasms.   diclofenac 50 MG tablet Commonly known as:  CATAFLAM Take 1 tablet (50 mg total) by mouth 3 (three) times daily. One tablet TID with food prn pain. What changed:  when to take this  reasons to take this  additional instructions   diclofenac sodium 1 % Gel Commonly known as:  VOLTAREN Apply 4 g topically 4 (four) times daily.   gabapentin 300 MG capsule Commonly known as:  NEURONTIN Take 1 capsule (300 mg total) by mouth at bedtime. You may take up to 3 times a day if no improvement.    lisinopril-hydrochlorothiazide 10-12.5 MG tablet Commonly known as:  PRINZIDE,ZESTORETIC Take 1 tablet by mouth daily.   megestrol 40 MG tablet Commonly known as:  MEGACE Take 2 tablets (80 mg total) by mouth 2 (two) times daily. What changed:  how much to take   meloxicam 15 MG tablet Commonly known as:  MOBIC Take 0.5 tablets (7.5 mg total) by mouth daily.      Donette Larry, CNM 12/15/2016, 3:39 PM

## 2016-12-15 NOTE — MAU Note (Addendum)
Was given some pills to stop her bleeding, taking the pills, continues to bleed. It did go away, but then came back about 3 days later and is heavier. Talked to her dr, was told to keep taking them and f/u here. Has started feeling dizzy, almost passed out yesterday and this mornnig

## 2016-12-15 NOTE — Discharge Instructions (Signed)

## 2016-12-25 ENCOUNTER — Ambulatory Visit (HOSPITAL_COMMUNITY)
Admission: RE | Admit: 2016-12-25 | Discharge: 2016-12-25 | Disposition: A | Discharge status: Home / Self Care | Payer: BLUE CROSS/BLUE SHIELD | Source: Ambulatory Visit | Attending: Certified Nurse Midwife | Admitting: Certified Nurse Midwife

## 2016-12-25 DIAGNOSIS — R9389 Abnormal findings on diagnostic imaging of other specified body structures: Secondary | ICD-10-CM | POA: Insufficient documentation

## 2016-12-25 DIAGNOSIS — N939 Abnormal uterine and vaginal bleeding, unspecified: Secondary | ICD-10-CM | POA: Diagnosis not present

## 2016-12-26 ENCOUNTER — Ambulatory Visit (INDEPENDENT_AMBULATORY_CARE_PROVIDER_SITE_OTHER): Payer: BLUE CROSS/BLUE SHIELD | Admitting: Family Medicine

## 2016-12-26 ENCOUNTER — Other Ambulatory Visit (HOSPITAL_COMMUNITY)
Admission: RE | Admit: 2016-12-26 | Discharge: 2016-12-26 | Disposition: A | Discharge status: Home / Self Care | Payer: BLUE CROSS/BLUE SHIELD | Source: Ambulatory Visit | Attending: Family Medicine | Admitting: Family Medicine

## 2016-12-26 ENCOUNTER — Encounter: Payer: Self-pay | Admitting: Family Medicine

## 2016-12-26 VITALS — BP 149/97 | HR 83 | Ht 64.0 in | Wt 194.0 lb

## 2016-12-26 DIAGNOSIS — N938 Other specified abnormal uterine and vaginal bleeding: Secondary | ICD-10-CM

## 2016-12-26 DIAGNOSIS — F329 Major depressive disorder, single episode, unspecified: Secondary | ICD-10-CM

## 2016-12-26 DIAGNOSIS — R4589 Other symptoms and signs involving emotional state: Secondary | ICD-10-CM

## 2016-12-26 NOTE — Progress Notes (Signed)
GYNECOLOGY OFFICE VISIT NOTE  History:  41 y.o. [id number] with significant for obesity, smoking, ad menorrhagia, here today to follow up on AUB. She reports long history of heavy menses, irregular. Now worse, reports she has been bleeding for about a month. Seen in MAU for this, and started on megace; bleeding has gotten lighter since, but still having some light bleeding.  She denies any abnormal vaginal discharge, bleeding, pelvic pain or other concerns. Denies recent changes in weight, change in appetite, heat/cold intolerance, tremors, palpitations, diarrhea or constipation  She reports she is s/p BTL.  Past Medical History:  Diagnosis Date  . Anxiety   . Depression   . Hypertension   . Migraine headache     Past Surgical History:  Procedure Laterality Date  . TUBAL LIGATION      The following portions of the patient's history were reviewed and updated as appropriate: allergies, current medications, past family history, past medical history, past social history, past surgical history and problem list.   Health Maintenance: Reports normal pap within last 3 years; need to get records  Review of Systems:  Pertinent items noted in HPI and remainder of comprehensive ROS otherwise negative.   Objective:  Physical Exam Ht 5\' 4"  (1.626 m)   Wt 194 lb (88 kg)   LMP 12/05/2016 (Exact Date)   BMI 33.30 kg/m  CONSTITUTIONAL: Well-developed, well-nourished female in no acute distress.  HENT:  Normocephalic, atraumatic. Oropharynx is clear and moist EYES: Conjunctivae and EOM are normal. Pupils are equal, round, and reactive to light. No scleral icterus.  NECK: Normal range of motion, supple, no masses SKIN: Skin is warm and dry. No rash noted. Not diaphoretic. No erythema. No pallor. NEUROLOGIC: Alert and oriented to person, place, and time.  PSYCHIATRIC: Normal mood and affect. Normal behavior.  CARDIOVASCULAR: Normal heart rate noted RESPIRATORY: Effort and breath sounds normal,  no problems with respiration noted ABDOMEN: Soft, no distention noted.   PELVIC: Normal appearing external genitalia; normal appearing vaginal mucosa and cervix.  No abnormal discharge noted.  Normal uterine size, no other palpable masses, no uterine or adnexal tenderness. MUSCULOSKELETAL: Normal range of motion. No edema noted.  Labs and Imaging US Transvaginal Non-ob  Result Date: 12/25/2016 CLINICAL DATA:  Abnormal uterine bleeding, heavy vaginal bleeding for 3 weeks EXAM: TRANSABDOMINAL AND TRANSVAGINAL ULTRASOUND OF PELVIS TECHNIQUE: Both transabdominal and transvaginal ultrasound examinations of the pelvis were performed. Transabdominal technique was performed for global imaging of the pelvis including uterus, ovaries, adnexal regions, and pelvic cul-de-sac. It was necessary to proceed with endovaginal exam following the transabdominal exam to visualize the endometrium, ovaries, and adnexa. COMPARISON:  08/05/2012 FINDINGS: Uterus Measurements: 9.7 x 4.8 x 4.8 cm. Normal morphology without mass Endometrium Thickness: 18 mm thick. Heterogeneous appearance without discrete mass. Right ovary Measurements:  2.4 x 1.5 x 2.6 cm. Normal morphology without mass Left ovary Measurements: 2.7 x 1.1 x 1.5 cm. Normal morphology without mass Other findings No free pelvic fluid or adnexal masses IMPRESSION: Thickened and heterogeneous endometrial complex 18 mm thick. Endometrial thickness is considered abnormal. If bleeding remains unresponsive to hormonal or medical therapy, focal lesion work-up with sonohysterogram should be considered. Endometrial biopsy should also be considered in pre-menopausal patients at high risk for endometrial carcinoma. (Ref: Radiological Reasoning: Algorithmic Workup of Abnormal Vaginal Bleeding with Endovaginal Sonography and Sonohysterography. AJR 2008; 425:Z56-38) Electronically Signed   By: Ulyses Southward M.D.   On: 12/25/2016 14:47   US Pelvis Complete  Result Date:  12/25/2016 CLINICAL DATA:  Abnormal uterine bleeding, heavy vaginal bleeding for 3 weeks EXAM: TRANSABDOMINAL AND TRANSVAGINAL ULTRASOUND OF PELVIS TECHNIQUE: Both transabdominal and transvaginal ultrasound examinations of the pelvis were performed. Transabdominal technique was performed for global imaging of the pelvis including uterus, ovaries, adnexal regions, and pelvic cul-de-sac. It was necessary to proceed with endovaginal exam following the transabdominal exam to visualize the endometrium, ovaries, and adnexa. COMPARISON:  08/05/2012 FINDINGS: Uterus Measurements: 9.7 x 4.8 x 4.8 cm. Normal morphology without mass Endometrium Thickness: 18 mm thick. Heterogeneous appearance without discrete mass. Right ovary Measurements:  2.4 x 1.5 x 2.6 cm. Normal morphology without mass Left ovary Measurements: 2.7 x 1.1 x 1.5 cm. Normal morphology without mass Other findings No free pelvic fluid or adnexal masses IMPRESSION: Thickened and heterogeneous endometrial complex 18 mm thick. Endometrial thickness is considered abnormal. If bleeding remains unresponsive to hormonal or medical therapy, focal lesion work-up with sonohysterogram should be considered. Endometrial biopsy should also be considered in pre-menopausal patients at high risk for endometrial carcinoma. (Ref: Radiological Reasoning: Algorithmic Workup of Abnormal Vaginal Bleeding with Endovaginal Sonography and Sonohysterography. AJR 2008; [id number]:[id number]) Electronically Signed   By: Ulyses Southward M.D.   On: 12/25/2016 14:47    Assessment & Plan:  AUB - Hgb 11.5 on 12/15/16 - Neg STD testing in MAU on 9/16 - U/S as above showing 18 mm endometrial complex - Endometrial biopsy done today - TSH ordered, draw next visit (lab closed at end of visit today) - Request pap results - Continue megace for now - Follow up in 1-2 weeks to discuss endometrial bx results and management (I briefly discussed medical management options with patient today if negative  endometrial bx)  Depressed mood  - Positive PHQ-9 today. No SI/HI - Scheduled BH follow up  Routine preventative health maintenance measures emphasized. Please refer to After Visit Summary for other counseling recommendations.   Return in about 2 weeks (around 01/09/2017) for GYN follow up .  Raynelle Fanning P. Armya Westerhoff, MD [facility] Fellow [facility]  Future Appointments Date Time Provider Department Center  01/10/2017 1:40 PM Reva Bores, MD [facility]  01/10/2017 2:00 PM [facility] [place] [facility]

## 2016-12-30 ENCOUNTER — Encounter: Payer: Self-pay | Admitting: Family Medicine

## 2016-12-30 NOTE — Progress Notes (Signed)
ENDOMETRIAL BIOPSY     The indications for endometrial biopsy were reviewed.   Risks of the biopsy including cramping, bleeding, infection, uterine perforation, inadequate specimen and need for additional procedures  were discussed. The patient states she understands and agrees to undergo procedure today. Consent was signed. Time out was performed. Urine HCG was negative. During the pelvic exam, the cervix was prepped with Betadine. A single-toothed tenaculum was placed on the anterior lip of the cervix to stabilize it. The 3 mm pipelle was introduced into the endometrial cavity without difficulty to a depth of 8.5 cm, and a moderate amount of tissue was obtained and sent to pathology. The instruments were removed from the patient's vagina. Minimal bleeding from the cervix was noted. The patient tolerated the procedure well. Routine post-procedure instructions were given to the patient.    Raynelle Fanning P. Vartan Kerins, MD OB Fellow

## 2017-01-10 ENCOUNTER — Encounter: Payer: Self-pay | Admitting: Family Medicine

## 2017-01-10 ENCOUNTER — Ambulatory Visit: Payer: BLUE CROSS/BLUE SHIELD | Admitting: Clinical

## 2017-01-10 ENCOUNTER — Ambulatory Visit (INDEPENDENT_AMBULATORY_CARE_PROVIDER_SITE_OTHER): Payer: BLUE CROSS/BLUE SHIELD | Admitting: Family Medicine

## 2017-01-10 VITALS — BP 142/103 | HR 83 | Ht 64.5 in | Wt 202.0 lb

## 2017-01-10 DIAGNOSIS — N921 Excessive and frequent menstruation with irregular cycle: Secondary | ICD-10-CM | POA: Diagnosis not present

## 2017-01-10 DIAGNOSIS — F4321 Adjustment disorder with depressed mood: Secondary | ICD-10-CM

## 2017-01-10 NOTE — Patient Instructions (Signed)
Endometrial Ablation Endometrial ablation is a procedure that destroys the thin inner layer of the lining of the uterus (endometrium). This procedure may be done:  To stop heavy periods.  To stop bleeding that is causing anemia.  To control irregular bleeding.  To treat bleeding caused by small tumors (fibroids) in the endometrium.  This procedure is often an alternative to major surgery, such as removal of the uterus and cervix (hysterectomy). As a result of this procedure:  You may not be able to have children. However, if you are premenopausal (you have not gone through menopause): ? You may still have a small chance of getting pregnant. ? You will need to use a reliable method of birth control after the procedure to prevent pregnancy.  You may stop having a menstrual period, or you may have only a small amount of bleeding during your period. Menstruation may return several years after the procedure.  Tell a health care provider about:  Any allergies you have.  All medicines you are taking, including vitamins, herbs, eye drops, creams, and over-the-counter medicines.  Any problems you or family members have had with the use of anesthetic medicines.  Any blood disorders you have.  Any surgeries you have had.  Any medical conditions you have. What are the risks? Generally, this is a safe procedure. However, problems may occur, including:  A hole (perforation) in the uterus or bowel.  Infection of the uterus, bladder, or vagina.  Bleeding.  Damage to other structures or organs.  An air bubble in the lung (air embolus).  Problems with pregnancy after the procedure.  Failure of the procedure.  Decreased ability to diagnose cancer in the endometrium.  What happens before the procedure?  You will have tests of your endometrium to make sure there are no pre-cancerous cells or cancer cells present.  You may have an ultrasound of the uterus.  You may be given  medicines to thin the endometrium.  Ask your health care provider about: ? Changing or stopping your regular medicines. This is especially important if you take diabetes medicines or blood thinners. ? Taking medicines such as aspirin and ibuprofen. These medicines can thin your blood. Do not take these medicines before your procedure if your doctor tells you not to.  Plan to have someone take you home from the hospital or clinic. What happens during the procedure?  You will lie on an exam table with your feet and legs supported as in a pelvic exam.  To lower your risk of infection: ? Your health care team will wash or sanitize their hands and put on germ-free (sterile) gloves. ? Your genital area will be washed with soap.  An IV tube will be inserted into one of your veins.  You will be given a medicine to help you relax (sedative).  A surgical instrument with a light and camera (resectoscope) will be inserted into your vagina and moved into your uterus. This allows your surgeon to see inside your uterus.  Endometrial tissue will be removed using one of the following methods: ? Radiofrequency. This method uses a radiofrequency-alternating electric current to remove the endometrium. ? Cryotherapy. This method uses extreme cold to freeze the endometrium. ? Heated-free liquid. This method uses a heated saltwater (saline) solution to remove the endometrium. ? Microwave. This method uses high-energy microwaves to heat up the endometrium and remove it. ? Thermal balloon. This method involves inserting a catheter with a balloon tip into the uterus. The balloon tip is   filled with heated fluid to remove the endometrium. The procedure may vary among health care providers and hospitals. What happens after the procedure?  Your blood pressure, heart rate, breathing rate, and blood oxygen level will be monitored until the medicines you were given have worn off.  As tissue healing occurs, you may  notice vaginal bleeding for 4-6 weeks after the procedure. You may also experience: ? Cramps. ? Thin, watery vaginal discharge that is light pink or brown in color. ? A need to urinate more frequently than usual. ? Nausea.  Do not drive for 24 hours if you were given a sedative.  Do not have sex or insert anything into your vagina until your health care provider approves. Summary  Endometrial ablation is done to treat the many causes of heavy menstrual bleeding.  The procedure may be done only after medications have been tried to control the bleeding.  Plan to have someone take you home from the hospital or clinic. This information is not intended to replace advice given to you by your health care provider. Make sure you discuss any questions you have with your health care provider. Document Released: 01/07/2004 Document Revised: 03/16/2016 Document Reviewed: 03/16/2016 Elsevier Interactive Patient Education  2017 [employer]  

## 2017-01-10 NOTE — Progress Notes (Signed)
Pt stated still having bleeding since after the biopsy was done

## 2017-01-10 NOTE — BH Specialist Note (Signed)
Integrated Behavioral Health Initial Visit  MRN: [id number] Name: Kristin [name]  Number of Integrated Behavioral Health Clinician visits:: 1/6 Session Start time: 2:45  Session End time: 2:50 Total time: 5 minutes  Type of Service: Integrated Behavioral Health- Individual/Family Interpretor:No. Interpretor Name and Language: n/a   Warm Hand Off Completed.       SUBJECTIVE: Kristin [name] is a 41 y.o. female accompanied by n/a Patient was referred by Dr [name] for grief Patient reports the following symptoms/concerns: Pt states she does not have time to talk today, denies SI, says "I lost my brother", but has no intent or plan to hurt herself. Pt says her "thoughts of death" are her thinking about her brother only. Pt open to taking educational materials today, and finding a new psychiatrist who will prescribe Klonopin. Duration of problem: Undetermined; Severity of problem: severe  OBJECTIVE: Mood: Appropriate and Affect: Appropriate Risk of harm to self or others: No plan to harm self or others  LIFE CONTEXT: Family and Social: - School/Work: Works as Water engineer: - Life Changes: Loss of brother  GOALS ADDRESSED: Patient will: 1. Reduce symptoms of: anxiety and depression 2. Increase knowledge and/or ability of: coping skills  3. Demonstrate ability to: Increase healthy adjustment to current life circumstances and Begin healthy grieving over loss  INTERVENTIONS: Interventions utilized: Supportive Counseling and Psychoeducation and/or Health Education  Standardized Assessments completed: GAD-7 and PHQ 9  ASSESSMENT: Patient currently experiencing Grief.   Patient may benefit from psychoeducational materials today, regarding coping with symptoms of anxiety and depression, increased since loss.  PLAN: 1. Follow up with behavioral health clinician on : As needed 2. Behavioral recommendations:  Psychologist, counselling for local psychiatrists who accept insurance; establish care  asap  -Consider reading educational material regarding coping with symptoms of anxiety and depression that have increased as a result of grief. 3. Referral(s): Integrated Behavioral Health Services (In Clinic) 4. "From scale of 1-10, how likely are you to follow plan?": 9  Rae Lips, LCSWA  Depression screen [facility] 2/9 12/26/2016 12/05/2016 09/07/2016 08/25/2016 08/23/2015  Decreased Interest - 0 0 0 3  Down, Depressed, Hopeless 1 0 0 0 3  PHQ - 2 Score 1 0 0 0 6  Altered sleeping 3 - - - -  Tired, decreased energy 3 - - - -  Change in appetite 3 - - - -  Feeling bad or failure about yourself  0 - - - -  Trouble concentrating 3 - - - -  Moving slowly or fidgety/restless 0 - - - -  Suicidal thoughts 0 - - - -  PHQ-9 Score 13 - - - -   GAD 7 : Generalized Anxiety Score 12/26/2016  Nervous, Anxious, on Edge 3  Control/stop worrying 2  Worry too much - different things 3  Trouble relaxing 3  Restless 3  Easily annoyed or irritable 3  Afraid - awful might happen 3  Total GAD 7 Score 20

## 2017-01-10 NOTE — Progress Notes (Signed)
    Subjective:    Patient ID: Kristin [name] is a 41 y.o. female presenting with Follow-up  on 01/10/2017  HPI: Here for bleeding. [id number] with three prior vaginal births. Has been on Megace and it is not working. Works as a Lawyer. Seen previously by [facility] fellow and had negative EMB.   Review of Systems  Constitutional: Negative for chills and fever.  Respiratory: Negative for shortness of breath.   Cardiovascular: Negative for chest pain.  Gastrointestinal: Negative for abdominal pain, nausea and vomiting.  Genitourinary: Positive for vaginal bleeding. Negative for dysuria.  Skin: Negative for rash.      Objective:    BP (!) 142/103   Pulse 83   Ht 5' 4.5" (1.638 m)   Wt 202 lb (91.6 kg)   BMI 34.14 kg/m  Physical Exam  Constitutional: She is oriented to person, place, and time. She appears well-developed and well-nourished. No distress.  HENT:  Head: Normocephalic and atraumatic.  Eyes: No scleral icterus.  Neck: Neck supple.  Cardiovascular: Normal rate.   Pulmonary/Chest: Effort normal.  Abdominal: Soft.  Neurological: She is alert and oriented to person, place, and time.  Skin: Skin is warm and dry.  Psychiatric: She has a normal mood and affect.   U/S reveals nml size uterus and thickened endometrial stripe. nml ovaries.     Assessment & Plan:   Problem List Items Addressed This Visit      Unprioritized   Menorrhagia - Primary    Treatment options reviewed in detail with the patient including oral meds, IUD, endometrial ablation or hysterectomy. Patient has failed meds. OC's are not an option due to smoking and age >35 and BP issues. She declines IUD. After careful consderation, she opts for endometrial ablation. S/p BTL. Risks include but are not limited to bleeding, infection, injury to surrounding structures, including bowel, bladder and ureters, blood clots, and death.  Likelihood of success is high.       Relevant Orders   TSH (Completed)   CBC  (Completed)    will update pap prior to surgery.  Total face-to-face time with patient: 25 minutes. Over 50% of encounter was spent on counseling and coordination of care. Return in about 3 months (around 04/12/2017) for postop check.  Reva Bores 01/10/2017 2:27 PM

## 2017-01-11 LAB — CBC
Hematocrit: 37.7 % (ref 34.0–46.6)
Hemoglobin: 12.2 g/dL (ref 11.1–15.9)
MCH: 30.4 pg (ref 26.6–33.0)
MCHC: 32.4 g/dL (ref 31.5–35.7)
MCV: 94 fL (ref 79–97)
PLATELETS: 210 10*3/uL (ref 150–379)
RBC: 4.01 x10E6/uL (ref 3.77–5.28)
RDW: 14.2 % (ref 12.3–15.4)
WBC: 3.7 10*3/uL (ref 3.4–10.8)

## 2017-01-11 LAB — TSH: TSH: 0.471 u[IU]/mL (ref 0.450–4.500)

## 2017-01-11 NOTE — Assessment & Plan Note (Signed)
Treatment options reviewed in detail with the patient including oral meds, IUD, endometrial ablation or hysterectomy. Patient has failed meds. OC's are not an option due to smoking and age >35 and BP issues. She declines IUD. After careful consderation, she opts for endometrial ablation. S/p BTL. Risks include but are not limited to bleeding, infection, injury to surrounding structures, including bowel, bladder and ureters, blood clots, and death.  Likelihood of success is high.

## 2017-01-12 ENCOUNTER — Other Ambulatory Visit: Payer: Self-pay | Admitting: Student

## 2017-01-19 ENCOUNTER — Encounter (HOSPITAL_COMMUNITY): Payer: Self-pay

## 2017-01-26 ENCOUNTER — Telehealth: Payer: Self-pay | Admitting: General Practice

## 2017-01-26 NOTE — Telephone Encounter (Signed)
Called and notified patient in regards to appointment with Dr. [name] on 02/23/17 at 10:40am.  Patient voiced understanding.

## 2017-02-23 ENCOUNTER — Ambulatory Visit: Payer: BLUE CROSS/BLUE SHIELD | Admitting: Family Medicine

## 2017-02-23 ENCOUNTER — Telehealth: Payer: Self-pay

## 2017-02-23 ENCOUNTER — Encounter: Payer: Self-pay | Admitting: Family Medicine

## 2017-02-23 NOTE — Telephone Encounter (Signed)
Per Dr. [name] Pt., Called to reschedule appt,Pt. Stated has the Flu and that's the reason why she didn't come in. Wanted me to reschedule for her but advised that she would need to contact Front desk directly. Pt became upset & stated that she would just have Pap Smear done at PCP. So spoke to Dr.Pratts nurse & she advised she needs to have Pap done hear so Surgery can be done. So called pt. back to advise that Front desk will call her to reschedule. And that the [facility] will call her regarding surgery date.

## 2017-02-23 NOTE — Progress Notes (Signed)
Patient did not keep appointment today. She will be called to reschedule.  

## 2017-02-25 ENCOUNTER — Ambulatory Visit (HOSPITAL_COMMUNITY)
Admission: EM | Admit: 2017-02-25 | Discharge: 2017-02-25 | Disposition: A | Discharge status: Home / Self Care | Payer: BLUE CROSS/BLUE SHIELD | Attending: Internal Medicine | Admitting: Internal Medicine

## 2017-02-25 ENCOUNTER — Encounter (HOSPITAL_COMMUNITY): Payer: Self-pay

## 2017-02-25 ENCOUNTER — Other Ambulatory Visit: Payer: Self-pay

## 2017-02-25 DIAGNOSIS — R69 Illness, unspecified: Secondary | ICD-10-CM | POA: Diagnosis not present

## 2017-02-25 DIAGNOSIS — J111 Influenza due to unidentified influenza virus with other respiratory manifestations: Secondary | ICD-10-CM

## 2017-02-25 LAB — POCT PREGNANCY, URINE: Preg Test, Ur: NEGATIVE

## 2017-02-25 MED ORDER — CETIRIZINE-PSEUDOEPHEDRINE ER 5-120 MG PO TB12
1.0000 | ORAL_TABLET | Freq: Every day | ORAL | 0 refills | Status: DC
Start: 2017-02-25 — End: 2017-03-16

## 2017-02-25 MED ORDER — BENZONATATE 100 MG PO CAPS: 100.0000 mg | ORAL_CAPSULE | Freq: Three times a day (TID) | ORAL | 0 refills | Status: DC

## 2017-02-25 MED ORDER — MELOXICAM 7.5 MG PO TABS
7.5000 mg | ORAL_TABLET | Freq: Every day | ORAL | 0 refills | Status: DC
Start: 2017-02-25 — End: 2017-03-16

## 2017-02-25 MED ORDER — FLUTICASONE PROPIONATE 50 MCG/ACT NA SUSP: 2.0000 | Freq: Every day | NASAL | 0 refills | Status: DC

## 2017-02-25 NOTE — Discharge Instructions (Signed)
Tessalon for cough. Mobic for body aches. Start flonase, zyrtec-D for nasal congestion. You can use over the counter nasal saline rinse such as neti pot for nasal congestion. Keep hydrated, your urine should be clear to pale yellow in color. Tylenol/motrin for fever and pain. Monitor for any worsening of symptoms, chest pain, shortness of breath, wheezing, swelling of the throat, follow up for reevaluation.   For sore throat try using a honey-based tea. Use 3 teaspoons of honey with juice squeezed from half lemon. Place shaved pieces of ginger into 1/2-1 cup of water and warm over stove top. Then mix the ingredients and repeat every 4 hours as needed.

## 2017-02-25 NOTE — ED Triage Notes (Signed)
Pt presents today with body aches, fever, chills, and sinus problems that has been going on for 2 days. She would also like a pregnancy test while she is here.

## 2017-02-25 NOTE — ED Provider Notes (Signed)
[facility]    CSN: [id number] Arrival date & time: 02/25/17  1552     History   Chief Complaint Chief Complaint  Patient presents with  . Generalized Body Aches    HPI Kristin [name] is a 41 y.o. female.   41 year old female comes in for 2 day history of URI symptoms. She has had body aches, subjective fever, chills, sinus pressure, rhinorrhea, nasal congestion. Productive cough, worse at night. otc nyquil/dayquil, cold medications without relief. mucinex with some relief. 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 Current every day smoker, 1/3 ppd, about 3 pack year history. Inhalers in the past. Denies shortness of breath, wheezing.   Would like pregnancy test today. States that she has had abnormal uterine bleeding for the past 4 months and has been evaluated by GYN and will be going through a procedure. States "I just want to make sure".       Past Medical History:  Diagnosis Date  . Anxiety   . Depression   . Hypertension   . Migraine headache     Patient Active Problem List   Diagnosis Date Noted  . Back pain 08/25/2016  . Generalized anxiety disorder 08/23/2015  . Knee pain 12/21/2014  . Menorrhagia 09/04/2012  . Allergic rhinitis 06/12/2012  . Eczema 03/26/2012  . Alcohol abuse 10/26/2010  . Bipolar 1 disorder (HCC) 10/09/2010  . Panic attacks 10/09/2010  . INSOMNIA, CHRONIC 06/29/2009  . TOBACCO USER 01/23/2009  . Obesity 10/29/2008  . Essential hypertension 12/20/2006    Past Surgical History:  Procedure Laterality Date  . TUBAL LIGATION      OB History    Gravida Para Term Preterm AB Living   4 3 3   1 3    SAB TAB Ectopic Multiple Live Births     1             Home Medications    Prior to Admission medications   Medication Sig Start Date End Date Taking? Authorizing Provider  clonazePAM (KLONOPIN) 0.5 MG tablet Take 1 tablet (0.5 mg total) by mouth 2 (two) times daily as needed for anxiety. 10/30/16  Yes  Renne MuscaWarden, Daniel L, MD  clotrimazole-betamethasone (LOTRISONE) cream Apply 1 application topically 2 (two) times daily. 09/07/16  Yes Rumley, Hastings N, DO  cyclobenzaprine (FLEXERIL) 10 MG tablet Take 1 tablet (10 mg total) by mouth 2 (two) times daily as needed for muscle spasms. 09/21/16  Yes Linwood DibblesKnapp, Jon, MD  diclofenac (CATAFLAM) 50 MG tablet Take 1 tablet (50 mg total) by mouth 3 (three) times daily. One tablet TID with food prn pain. Patient taking differently: Take 50 mg by mouth 3 (three) times daily as needed. One tablet TID with food prn pain. 09/16/16  Yes Mabe, Onalee Huaavid, NP  gabapentin (NEURONTIN) 300 MG capsule Take 1 capsule (300 mg total) by mouth at bedtime. You may take up to 3 times a day if no improvement. 12/05/16  Yes Almon HerculesGonfa, Taye T, MD  lisinopril-hydrochlorothiazide (PRINZIDE,ZESTORETIC) 10-12.5 MG tablet Take 1 tablet by mouth daily.   Yes [provider]  megestrol (MEGACE) 40 MG tablet Take 2 tablets (80 mg total) by mouth 2 (two) times daily. 12/15/16  Yes Donette LarryBhambri, Melanie, CNM  benzonatate (TESSALON) 100 MG capsule Take 1 capsule (100 mg total) by mouth every 8 (eight) hours. 02/25/17   Cathie HoopsYu, Mariusz Jubb V, PA-C  cetirizine-pseudoephedrine (ZYRTEC-D) 5-120 MG tablet Take 1 tablet by mouth daily. 02/25/17   Linward HeadlandYu, Evans Levee  V, PA-C  fluticasone (FLONASE) 50 MCG/ACT nasal spray Place 2 sprays into both nostrils daily. 02/25/17   Cathie Hoops, Aundrea Horace V, PA-C  meloxicam (MOBIC) 7.5 MG tablet Take 1 tablet (7.5 mg total) by mouth daily. 02/25/17   Belinda Fisher, PA-C    Family History Family History  Problem Relation Age of Onset  . Cancer Maternal Grandmother        Breast  . Hypertension Mother   . Hypertension Brother   . Cancer Maternal Uncle        colon    Social History Social History   Tobacco Use  . Smoking status: Current Every Day Smoker    Packs/day: 0.25    Years: 5.00    Pack years: 1.25  . Smokeless tobacco: Never Used  Substance Use Topics  . Alcohol use: Yes    Comment:  occassionally   . Drug use: No     Allergies   Sumatriptan   Review of Systems Review of Systems  Reason unable to perform ROS: See HPI as above.     Physical Exam Triage Vital Signs ED Triage Vitals  Enc Vitals Group     BP 02/25/17 1617 135/87     Pulse Rate 02/25/17 1617 77     Resp 02/25/17 1617 16     Temp 02/25/17 1617 98.9 F (37.2 C)     Temp Source 02/25/17 1617 Oral     SpO2 02/25/17 1617 98 %     Weight --      Height --      Head Circumference --      Peak Flow --      Pain Score 02/25/17 1620 10     Pain Loc --      Pain Edu? --      Excl. in GC? --    No data found.  Updated Vital Signs BP 135/87 (BP Location: Right Arm)   Pulse 77   Temp 98.9 F (37.2 C) (Oral)   Resp 16   SpO2 98%   Visual Acuity Right Eye Distance:   Left Eye Distance:   Bilateral Distance:    Right Eye Near:   Left Eye Near:    Bilateral Near:     Physical Exam  Constitutional: She is oriented to person, place, and time. She appears well-developed and well-nourished. No distress.  HENT:  Head: Normocephalic and atraumatic.  Right Ear: External ear and ear canal normal. Tympanic membrane is erythematous. Tympanic membrane is not bulging.  Left Ear: External ear and ear canal normal. Tympanic membrane is erythematous. Tympanic membrane is not bulging.  Nose: Mucosal edema and rhinorrhea present. Right sinus exhibits maxillary sinus tenderness. Right sinus exhibits no frontal sinus tenderness. Left sinus exhibits maxillary sinus tenderness. Left sinus exhibits no frontal sinus tenderness.  Mouth/Throat: Uvula is midline, oropharynx is clear and moist and mucous membranes are normal.  Eyes: Conjunctivae are normal. Pupils are equal, round, and reactive to light.  Neck: Normal range of motion. Neck supple.  Cardiovascular: Normal rate, regular rhythm and normal heart sounds. Exam reveals no gallop and no friction rub.  No murmur heard. Pulmonary/Chest: Effort normal and  breath sounds normal. She has no decreased breath sounds. She has no wheezes. She has no rhonchi. She has no rales.  Lymphadenopathy:    She has no cervical adenopathy.  Neurological: She is alert and oriented to person, place, and time.  Skin: Skin is warm and dry.  Psychiatric: She has a  normal mood and affect. Her behavior is normal. Judgment normal.     UC Treatments / Results  Labs (all labs ordered are listed, but only abnormal results are displayed) Labs Reviewed  POCT PREGNANCY, URINE    EKG  EKG Interpretation None       Radiology No results found.  Procedures Procedures (including critical care time)  Medications Ordered in UC Medications - No data to display   Initial Impression / Assessment and Plan / UC Course  I have reviewed the triage vital signs and the nursing notes.  Pertinent labs & imaging results that were available during my care of the patient were reviewed by me and considered in my medical decision making (see chart for details).    Discussed with patient history and exam most consistent with viral URI. Symptomatic treatment as needed. Push fluids. Return precautions given.   Final Clinical Impressions(s) / UC Diagnoses   Final diagnoses:  Influenza-like illness    ED Discharge Orders        Ordered    benzonatate (TESSALON) 100 MG capsule  Every 8 hours     02/25/17 1640    cetirizine-pseudoephedrine (ZYRTEC-D) 5-120 MG tablet  Daily     02/25/17 1640    fluticasone (FLONASE) 50 MCG/ACT nasal spray  Daily     02/25/17 1640    meloxicam (MOBIC) 7.5 MG tablet  Daily     02/25/17 1640         Belinda Fisher, PA-C 02/25/17 1645

## 2017-02-27 ENCOUNTER — Ambulatory Visit (INDEPENDENT_AMBULATORY_CARE_PROVIDER_SITE_OTHER): Payer: BLUE CROSS/BLUE SHIELD | Admitting: Student

## 2017-02-27 ENCOUNTER — Encounter: Payer: Self-pay | Admitting: Student

## 2017-02-27 ENCOUNTER — Other Ambulatory Visit: Payer: Self-pay

## 2017-02-27 VITALS — BP 124/78 | HR 92 | Temp 97.9°F | Ht 64.5 in | Wt 204.4 lb

## 2017-02-27 DIAGNOSIS — F319 Bipolar disorder, unspecified: Secondary | ICD-10-CM

## 2017-02-27 DIAGNOSIS — L309 Dermatitis, unspecified: Secondary | ICD-10-CM | POA: Diagnosis not present

## 2017-02-27 DIAGNOSIS — G8929 Other chronic pain: Secondary | ICD-10-CM

## 2017-02-27 DIAGNOSIS — M5442 Lumbago with sciatica, left side: Secondary | ICD-10-CM | POA: Diagnosis not present

## 2017-02-27 DIAGNOSIS — B353 Tinea pedis: Secondary | ICD-10-CM | POA: Diagnosis not present

## 2017-02-27 DIAGNOSIS — M5441 Lumbago with sciatica, right side: Secondary | ICD-10-CM

## 2017-02-27 DIAGNOSIS — R05 Cough: Secondary | ICD-10-CM | POA: Diagnosis not present

## 2017-02-27 DIAGNOSIS — R059 Cough, unspecified: Secondary | ICD-10-CM

## 2017-02-27 MED ORDER — BENZONATATE 100 MG PO CAPS: 100.0000 mg | ORAL_CAPSULE | Freq: Three times a day (TID) | ORAL | 0 refills | Status: DC

## 2017-02-27 MED ORDER — CYCLOBENZAPRINE HCL 10 MG PO TABS: 10.0000 mg | ORAL_TABLET | Freq: Two times a day (BID) | ORAL | 0 refills | Status: DC | PRN

## 2017-02-27 MED ORDER — TERBINAFINE HCL 1 % EX CREA: 1.0000 "application " | TOPICAL_CREAM | Freq: Two times a day (BID) | CUTANEOUS | 0 refills | Status: DC

## 2017-02-27 MED ORDER — TRIAMCINOLONE ACETONIDE 0.025 % EX OINT: 1.0000 "application " | TOPICAL_OINTMENT | Freq: Two times a day (BID) | CUTANEOUS | 0 refills | Status: DC

## 2017-02-27 MED ORDER — CETIRIZINE HCL 10 MG PO TABS: 10.0000 mg | ORAL_TABLET | Freq: Every day | ORAL | 0 refills | Status: DC

## 2017-02-27 NOTE — Progress Notes (Signed)
Subjective:    Kristin [name] is a 41 y.o. old female here for itching, cough and back pain.  HPI Itching: for two weeks. All over. She has history of eczema. Worried about bed bug. She says she was with someone who had a bed bug.  Reports having similar symptoms in the past.  Itching improved with eczema medication in the past.  Cough: for a week. Cough is productive with yellowish phlegm. No hemoptysis. Runny nose, congestion, fever, body pain leading to this. Denies shortness of breathing. Admits chest pain with cough. She works at nursing home.  She has not had a flu shot yet.  Declines flu shot today.  Back pain: Chronic issue.  She reports having back spasms intermittently.  Back spasm usually improves with muscle relaxer.  Denies urinary retention, fever, bowel or bladder issue.  She was referred to neurosurgery in the past due to associated radiculopathy. She missed her apt and didn't call to make another apt. PMH/Problem List: has Obesity; TOBACCO USER; INSOMNIA, CHRONIC; Essential hypertension; Bipolar 1 disorder (HCC); Panic attacks; Alcohol abuse; Eczema; Allergic rhinitis; Menorrhagia; Knee pain; Generalized anxiety disorder; and Back pain on their problem list.   has a past medical history of Anxiety, Depression, Hypertension, and Migraine headache.  FH:  Family History  Problem Relation Age of Onset  . Cancer Maternal Grandmother        Breast  . Hypertension Mother   . Hypertension Brother   . Cancer Maternal Uncle        colon    SH Social History   Tobacco Use  . Smoking status: Current Every Day Smoker    Packs/day: 0.25    Years: 5.00    Pack years: 1.25  . Smokeless tobacco: Never Used  Substance Use Topics  . Alcohol use: Yes    Comment: occassionally   . Drug use: No    Review of Systems Review of systems negative except for pertinent positives and negatives in history of present illness above.     Objective:     Vitals:   02/27/17 1509  BP: 124/78    Pulse: 92  Temp: 97.9 F (36.6 C)  TempSrc: Oral  SpO2: 94%  Weight: 204 lb 6.4 oz (92.7 kg)  Height: 5' 4.5" (1.638 m)   Body mass index is 34.54 kg/m.  Physical Exam  GEN: appears well, no apparent distress. HEM: negative for cervical or periauricular lymphadenopathies CVS: RRR, nl s1 & s2, no murmurs, no edema RESP: no IWOB, good air movement bilaterally, CTAB MSK: Some tenderness to palpation over paraspinal muscles. SKIN: some keratosis pilaris but no other obvious lesion. NEURO: alert and oiented appropriately, no gross deficits  PSYCH: inappropriate at times. She suddenly pulled her shirts saying there a rash on her left breast that I do not see.  Denies SI/HI.  Assessment and Plan:  1. Eczema, unspecified type - triamcinolone (KENALOG) 0.025 % ointment; Apply 1 application topically 2 (two) times daily.  Dispense: 30 g; Refill: 0 - cetirizine (ZYRTEC ALLERGY) 10 MG tablet; Take 1 tablet (10 mg total) by mouth daily.  Dispense: 30 tablet; Refill: 0 2. Cough - benzonatate (TESSALON) 100 MG capsule; Take 1 capsule (100 mg total) by mouth every 8 (eight) hours.  Dispense: 21 capsule; Refill: 0  3. Tinea pedis of both feet - terbinafine (LAMISIL) 1 % cream; Apply 1 application topically 2 (two) times daily.  Dispense: 30 g; Refill: 0  4. Chronic bilateral low back pain with bilateral sciatica: Referred  to neurosurgery about 3 months ago.  He is to schedule about 1 days ago.  She says she could not get hold of anyone when she tries to call back.  I encouraged her to either go to their office or call again to reschedule this appointment. - cyclobenzaprine (FLEXERIL) 10 MG tablet; Take 1 tablet (10 mg total) by mouth 2 (two) times daily as needed for muscle spasms.  Dispense: 20 tablet; Refill: 0  5. Bipolar 1 disorder (HCC): some manic and inappropriate behaviors. - Ambulatory referral to Psychiatry  Return if symptoms worsen or fail to improve.  Kristin Hercules,  MD 02/27/17 Pager: [phone]

## 2017-02-27 NOTE — Patient Instructions (Signed)
It was great seeing you today! We have addressed the following issues today  For your cough, we sent a cough medication to your pharmacy.  For eczema/itching, without the prescription for a medication you can take by mouth.  You also gave you a prescription for steroid ointment  For your fungal foot infection, we sent a prescription for a cream to your pharmacy.  For your back pain, we sent a prescription for Flexeril to pharmacy.  For your mood issue, we send a referral to a psychiatrist.  Someone will get in touch with you over the next 2-4 weeks about this referral.  If we did any lab work today, and the results require attention, either me or my nurse will get in touch with you. If everything is normal, you will get a letter in mail and a message via . If you don't hear from [place] in two weeks, please give [place] a call. Otherwise, we look forward to seeing you again at your next visit. If you have any questions or concerns before then, please call the clinic at [phone].  Please bring all your medications to every doctors visit  Sign up for My Chart to have easy access to your labs results, and communication with your Primary care physician.    Please check-out at the front desk before leaving the clinic.    Take Care,   Dr. Alanda Slim

## 2017-03-16 ENCOUNTER — Encounter: Payer: Self-pay | Admitting: Family Medicine

## 2017-03-16 ENCOUNTER — Ambulatory Visit (INDEPENDENT_AMBULATORY_CARE_PROVIDER_SITE_OTHER): Payer: BLUE CROSS/BLUE SHIELD | Admitting: Family Medicine

## 2017-03-16 ENCOUNTER — Other Ambulatory Visit: Payer: Self-pay

## 2017-03-16 ENCOUNTER — Emergency Department (HOSPITAL_COMMUNITY)
Admission: EM | Admit: 2017-03-16 | Discharge: 2017-03-16 | Disposition: A | Discharge status: Home / Self Care | Payer: BLUE CROSS/BLUE SHIELD | Attending: Emergency Medicine | Admitting: Emergency Medicine

## 2017-03-16 ENCOUNTER — Emergency Department (HOSPITAL_COMMUNITY): Payer: BLUE CROSS/BLUE SHIELD

## 2017-03-16 ENCOUNTER — Encounter (HOSPITAL_COMMUNITY): Payer: Self-pay

## 2017-03-16 VITALS — BP 139/83 | HR 86 | Wt 203.0 lb

## 2017-03-16 DIAGNOSIS — Y999 Unspecified external cause status: Secondary | ICD-10-CM | POA: Insufficient documentation

## 2017-03-16 DIAGNOSIS — L259 Unspecified contact dermatitis, unspecified cause: Secondary | ICD-10-CM | POA: Diagnosis not present

## 2017-03-16 DIAGNOSIS — L309 Dermatitis, unspecified: Secondary | ICD-10-CM

## 2017-03-16 DIAGNOSIS — Z124 Encounter for screening for malignant neoplasm of cervix: Secondary | ICD-10-CM

## 2017-03-16 DIAGNOSIS — Y929 Unspecified place or not applicable: Secondary | ICD-10-CM | POA: Insufficient documentation

## 2017-03-16 DIAGNOSIS — I10 Essential (primary) hypertension: Secondary | ICD-10-CM | POA: Insufficient documentation

## 2017-03-16 DIAGNOSIS — Y939 Activity, unspecified: Secondary | ICD-10-CM | POA: Insufficient documentation

## 2017-03-16 DIAGNOSIS — Z113 Encounter for screening for infections with a predominantly sexual mode of transmission: Secondary | ICD-10-CM

## 2017-03-16 DIAGNOSIS — Z79899 Other long term (current) drug therapy: Secondary | ICD-10-CM | POA: Diagnosis not present

## 2017-03-16 DIAGNOSIS — M545 Low back pain: Secondary | ICD-10-CM | POA: Insufficient documentation

## 2017-03-16 DIAGNOSIS — Z1151 Encounter for screening for human papillomavirus (HPV): Secondary | ICD-10-CM

## 2017-03-16 HISTORY — DX: Dermatitis, unspecified: L30.9

## 2017-03-16 LAB — POC URINE PREG, ED: Preg Test, Ur: NEGATIVE

## 2017-03-16 MED ORDER — CYCLOBENZAPRINE HCL 10 MG PO TABS: 10.0000 mg | ORAL_TABLET | Freq: Two times a day (BID) | ORAL | 0 refills | Status: DC | PRN

## 2017-03-16 MED ORDER — NAPROXEN 500 MG PO TABS: 500.0000 mg | ORAL_TABLET | Freq: Two times a day (BID) | ORAL | 0 refills | Status: DC

## 2017-03-16 MED ORDER — TRIAMCINOLONE ACETONIDE 0.1 % EX CREA: 1.0000 "application " | TOPICAL_CREAM | Freq: Two times a day (BID) | CUTANEOUS | 0 refills | Status: DC
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
## 2017-03-16 NOTE — Progress Notes (Signed)
   Subjective:    Patient ID: Kristin [name] is a 42 y.o. female presenting with Gynecologic Exam  on 03/16/2017  HPI: Here today for pap smear. We have scheduled her for HTA in late January. Needs pap, prior to procedure. On Megace but still bleeding.  Review of Systems  Constitutional: Negative for chills and fever.  Respiratory: Negative for shortness of breath.   Cardiovascular: Negative for chest pain.  Gastrointestinal: Negative for abdominal pain, nausea and vomiting.  Genitourinary: Negative for dysuria.  Skin: Negative for rash.      Objective:    BP 139/83   Pulse 86   Wt 203 lb (92.1 kg)   BMI 34.31 kg/m  Physical Exam  Constitutional: She is oriented to person, place, and time. She appears well-developed and well-nourished. No distress.  HENT:  Head: Normocephalic and atraumatic.  Eyes: No scleral icterus.  Neck: Neck supple.  Cardiovascular: Normal rate.  Pulmonary/Chest: Effort normal.  Abdominal: Soft.  Neurological: She is alert and oriented to person, place, and time.  Skin: Skin is warm and dry.  Psychiatric: She has a normal mood and affect.        Assessment & Plan:   Problem List Items Addressed This Visit    None    Visit Diagnoses    Screening for cervical cancer    -  Primary   Relevant Orders   Cytology - PAP     Continue Megace until surgery. Total face-to-face time with patient: 10 minutes. Over 50% of encounter was spent on counseling and coordination of care.  Reva Bores 03/16/2017 3:56 PM

## 2017-03-20 LAB — CYTOLOGY - PAP
Chlamydia: NEGATIVE
DIAGNOSIS: NEGATIVE
HPV (WINDOPATH): NOT DETECTED
Neisseria Gonorrhea: NEGATIVE

## 2017-03-27 ENCOUNTER — Encounter (HOSPITAL_BASED_OUTPATIENT_CLINIC_OR_DEPARTMENT_OTHER): Payer: Self-pay | Admitting: *Deleted

## 2017-03-29 NOTE — H&P (Signed)
Kristin [name] is an 42 y.o. [id number] female.   Chief Complaint: abnormal bleeding HPI: Here for bleeding. G9E0100 with three prior vaginal births. Has been on Megace and it is not working. Works as a Lawyer. Seen previously by [facility] fellow and had negative EMB. desires better treatment option. Normal pap    Past Medical History:  Diagnosis Date  . Anxiety   . Arthritis   . Depression   . Eczema   . Hypertension   . Migraine headache     Past Surgical History:  Procedure Laterality Date  . TUBAL LIGATION      Family History  Problem Relation Age of Onset  . Cancer Maternal Grandmother        Breast  . Hypertension Mother   . Hypertension Brother   . Cancer Maternal Uncle        colon   Social History:  reports that she has quit smoking. She has a 1.25 pack-year smoking history. she has never used smokeless tobacco. She reports that she drinks alcohol. She reports that she does not use drugs.  Allergies:  Allergies  Allergen Reactions  . Sumatriptan Anaphylaxis    No medications prior to admission.    A comprehensive review of systems was negative.  Height 5' 4.5" (1.638 m), weight 203 lb (92.1 kg), last menstrual period 03/16/2017. General appearance: alert, cooperative and appears stated age Head: Normocephalic, without obvious abnormality, atraumatic Neck: supple, symmetrical, trachea midline Lungs: normal effort Heart: regular rate and rhythm Abdomen: soft, non-tender; bowel sounds normal; no masses,  no organomegaly Extremities: extremities normal, atraumatic, no cyanosis or edema Skin: Skin color, texture, turgor normal. No rashes or lesions Neurologic: Grossly normal   Lab Results  Component Value Date   WBC 3.7 01/10/2017   HGB 12.2 01/10/2017   HCT 37.7 01/10/2017   MCV 94 01/10/2017   PLT 210 01/10/2017   Lab Results  Component Value Date   PREGTESTUR NEGATIVE 03/16/2017   HCG <5.0 10/07/2016     Assessment/Plan Principal Problem:  Menorrhagia  For Dilation and Curettage with Hysteroscopy and HTA endometrial ablation. Risks include but are not limited to bleeding, infection, injury to surrounding structures, including bowel, bladder and ureters, blood clots, and death.  Likelihood of success is high.    Reva Bores 03/29/2017, 11:38 AM

## 2017-04-03 ENCOUNTER — Encounter (HOSPITAL_BASED_OUTPATIENT_CLINIC_OR_DEPARTMENT_OTHER)
Admission: RE | Admit: 2017-04-03 | Discharge: 2017-04-03 | Disposition: A | Discharge status: Home / Self Care | Payer: BLUE CROSS/BLUE SHIELD | Source: Ambulatory Visit | Attending: Family Medicine | Admitting: Family Medicine

## 2017-04-03 DIAGNOSIS — M1990 Unspecified osteoarthritis, unspecified site: Secondary | ICD-10-CM | POA: Diagnosis not present

## 2017-04-03 DIAGNOSIS — N920 Excessive and frequent menstruation with regular cycle: Secondary | ICD-10-CM | POA: Diagnosis not present

## 2017-04-03 DIAGNOSIS — Z87891 Personal history of nicotine dependence: Secondary | ICD-10-CM | POA: Diagnosis not present

## 2017-04-03 DIAGNOSIS — I10 Essential (primary) hypertension: Secondary | ICD-10-CM | POA: Diagnosis not present

## 2017-04-03 DIAGNOSIS — Z888 Allergy status to other drugs, medicaments and biological substances status: Secondary | ICD-10-CM | POA: Diagnosis not present

## 2017-04-03 LAB — BASIC METABOLIC PANEL
Anion gap: 15 (ref 5–15)
BUN: 25 mg/dL — ABNORMAL HIGH (ref 6–20)
CO2: 23 mmol/L (ref 22–32)
CREATININE: 0.9 mg/dL (ref 0.44–1.00)
Calcium: 9.7 mg/dL (ref 8.9–10.3)
Chloride: 96 mmol/L — ABNORMAL LOW (ref 101–111)
Glucose, Bld: 109 mg/dL — ABNORMAL HIGH (ref 65–99)
POTASSIUM: 4 mmol/L (ref 3.5–5.1)
SODIUM: 134 mmol/L — AB (ref 135–145)

## 2017-04-03 LAB — CBC
HCT: 35.4 % — ABNORMAL LOW (ref 36.0–46.0)
Hemoglobin: 11.9 g/dL — ABNORMAL LOW (ref 12.0–15.0)
MCH: 30.3 pg (ref 26.0–34.0)
MCHC: 33.6 g/dL (ref 30.0–36.0)
MCV: 90.1 fL (ref 78.0–100.0)
PLATELETS: 215 10*3/uL (ref 150–400)
RBC: 3.93 MIL/uL (ref 3.87–5.11)
RDW: 15.6 % — AB (ref 11.5–15.5)
WBC: 4.3 10*3/uL (ref 4.0–10.5)

## 2017-04-04 ENCOUNTER — Encounter (HOSPITAL_BASED_OUTPATIENT_CLINIC_OR_DEPARTMENT_OTHER): Payer: Self-pay

## 2017-04-04 ENCOUNTER — Ambulatory Visit (HOSPITAL_BASED_OUTPATIENT_CLINIC_OR_DEPARTMENT_OTHER): Payer: BLUE CROSS/BLUE SHIELD | Admitting: Anesthesiology

## 2017-04-04 ENCOUNTER — Other Ambulatory Visit: Payer: Self-pay

## 2017-04-04 ENCOUNTER — Ambulatory Visit (HOSPITAL_BASED_OUTPATIENT_CLINIC_OR_DEPARTMENT_OTHER)
Admission: RE | Admit: 2017-04-04 | Discharge: 2017-04-04 | Disposition: A | Discharge status: Home / Self Care | Payer: BLUE CROSS/BLUE SHIELD | Source: Ambulatory Visit | Attending: Family Medicine | Admitting: Family Medicine

## 2017-04-04 ENCOUNTER — Encounter (HOSPITAL_BASED_OUTPATIENT_CLINIC_OR_DEPARTMENT_OTHER)
Admission: RE | Disposition: A | Discharge status: Home / Self Care | Payer: Self-pay | Source: Ambulatory Visit | Attending: Family Medicine

## 2017-04-04 DIAGNOSIS — N921 Excessive and frequent menstruation with irregular cycle: Secondary | ICD-10-CM | POA: Diagnosis not present

## 2017-04-04 DIAGNOSIS — I10 Essential (primary) hypertension: Secondary | ICD-10-CM | POA: Insufficient documentation

## 2017-04-04 DIAGNOSIS — M1990 Unspecified osteoarthritis, unspecified site: Secondary | ICD-10-CM | POA: Insufficient documentation

## 2017-04-04 DIAGNOSIS — Z87891 Personal history of nicotine dependence: Secondary | ICD-10-CM | POA: Insufficient documentation

## 2017-04-04 DIAGNOSIS — Z888 Allergy status to other drugs, medicaments and biological substances status: Secondary | ICD-10-CM | POA: Insufficient documentation

## 2017-04-04 DIAGNOSIS — N920 Excessive and frequent menstruation with regular cycle: Secondary | ICD-10-CM | POA: Diagnosis not present

## 2017-04-04 HISTORY — DX: Unspecified osteoarthritis, unspecified site: M19.90

## 2017-04-04 HISTORY — PX: DILITATION & CURRETTAGE/HYSTROSCOPY WITH HYDROTHERMAL ABLATION: SHX5570

## 2017-04-04 SURGERY — DILATATION & CURETTAGE/HYSTEROSCOPY WITH HYDROTHERMAL ABLATION
Anesthesia: General | Site: Vagina

## 2017-04-04 MED ORDER — PROPOFOL 10 MG/ML IV BOLUS
INTRAVENOUS | Status: DC | PRN
  Administered 2017-04-04: 200 mg via INTRAVENOUS

## 2017-04-04 MED ORDER — PROMETHAZINE HCL 25 MG/ML IJ SOLN: 6.2500 mg | INTRAMUSCULAR | Status: DC | PRN

## 2017-04-04 MED ORDER — OXYCODONE-ACETAMINOPHEN 5-325 MG PO TABS
2.0000 | ORAL_TABLET | Freq: Once | ORAL | Status: AC
  Administered 2017-04-04: 2 via ORAL

## 2017-04-04 MED ORDER — SUFENTANIL CITRATE 50 MCG/ML IV SOLN
INTRAVENOUS | Status: DC | PRN
  Administered 2017-04-04: 10 ug via INTRAVENOUS

## 2017-04-04 MED ORDER — BUPIVACAINE HCL (PF) 0.25 % IJ SOLN
INTRAMUSCULAR | Status: AC
  Filled 2017-04-04: qty 30

## 2017-04-04 MED ORDER — ONDANSETRON HCL 4 MG/2ML IJ SOLN
INTRAMUSCULAR | Status: AC
  Filled 2017-04-04: qty 2

## 2017-04-04 MED ORDER — HYDROMORPHONE HCL 1 MG/ML IJ SOLN
0.2500 mg | INTRAMUSCULAR | Status: DC | PRN
  Administered 2017-04-04: 0.5 mg via INTRAVENOUS

## 2017-04-04 MED ORDER — MIDAZOLAM HCL 2 MG/2ML IJ SOLN
1.0000 mg | INTRAMUSCULAR | Status: DC | PRN
  Administered 2017-04-04: 2 mg via INTRAVENOUS

## 2017-04-04 MED ORDER — KETOROLAC TROMETHAMINE 30 MG/ML IJ SOLN
30.0000 mg | Freq: Once | INTRAMUSCULAR | Status: AC
  Administered 2017-04-04: 30 mg via INTRAVENOUS

## 2017-04-04 MED ORDER — OXYCODONE-ACETAMINOPHEN 5-325 MG PO TABS
ORAL_TABLET | ORAL | Status: AC
  Filled 2017-04-04: qty 2

## 2017-04-04 MED ORDER — MIDAZOLAM HCL 2 MG/2ML IJ SOLN
INTRAMUSCULAR | Status: AC
  Filled 2017-04-04: qty 2

## 2017-04-04 MED ORDER — SUCCINYLCHOLINE CHLORIDE 200 MG/10ML IV SOSY
PREFILLED_SYRINGE | INTRAVENOUS | Status: AC
  Filled 2017-04-04: qty 10

## 2017-04-04 MED ORDER — SODIUM CHLORIDE 0.9 % IR SOLN
Status: DC | PRN
  Administered 2017-04-04: 3000 mL

## 2017-04-04 MED ORDER — PHENYLEPHRINE 40 MCG/ML (10ML) SYRINGE FOR IV PUSH (FOR BLOOD PRESSURE SUPPORT)
PREFILLED_SYRINGE | INTRAVENOUS | Status: AC
  Filled 2017-04-04: qty 10

## 2017-04-04 MED ORDER — BUPIVACAINE HCL (PF) 0.25 % IJ SOLN
INTRAMUSCULAR | Status: DC | PRN
  Administered 2017-04-04: 20 mL

## 2017-04-04 MED ORDER — HYDROMORPHONE HCL 1 MG/ML IJ SOLN
INTRAMUSCULAR | Status: AC
  Filled 2017-04-04: qty 0.5

## 2017-04-04 MED ORDER — LACTATED RINGERS IV SOLN: INTRAVENOUS | Status: DC

## 2017-04-04 MED ORDER — PROPOFOL 500 MG/50ML IV EMUL
INTRAVENOUS | Status: AC
  Filled 2017-04-04: qty 50

## 2017-04-04 MED ORDER — LIDOCAINE HCL (CARDIAC) 20 MG/ML IV SOLN
INTRAVENOUS | Status: DC | PRN
  Administered 2017-04-04: 50 mg via INTRAVENOUS

## 2017-04-04 MED ORDER — OXYCODONE-ACETAMINOPHEN 5-325 MG PO TABS: 1.0000 | ORAL_TABLET | Freq: Four times a day (QID) | ORAL | 0 refills | Status: DC | PRN

## 2017-04-04 MED ORDER — KETOROLAC TROMETHAMINE 30 MG/ML IJ SOLN
INTRAMUSCULAR | Status: AC
  Filled 2017-04-04: qty 1

## 2017-04-04 MED ORDER — EPHEDRINE 5 MG/ML INJ
INTRAVENOUS | Status: AC
  Filled 2017-04-04: qty 10

## 2017-04-04 MED ORDER — SCOPOLAMINE 1 MG/3DAYS TD PT72: 1.0000 | MEDICATED_PATCH | Freq: Once | TRANSDERMAL | Status: DC | PRN

## 2017-04-04 MED ORDER — SUFENTANIL CITRATE 50 MCG/ML IV SOLN
INTRAVENOUS | Status: AC
  Filled 2017-04-04: qty 1

## 2017-04-04 MED ORDER — FENTANYL CITRATE (PF) 100 MCG/2ML IJ SOLN
50.0000 ug | INTRAMUSCULAR | Status: DC | PRN
Start: 2017-04-04 — End: 2017-04-04

## 2017-04-04 MED ORDER — DEXAMETHASONE SODIUM PHOSPHATE 4 MG/ML IJ SOLN
INTRAMUSCULAR | Status: DC | PRN
  Administered 2017-04-04: 10 mg via INTRAVENOUS

## 2017-04-04 MED ORDER — LIDOCAINE 2% (20 MG/ML) 5 ML SYRINGE
INTRAMUSCULAR | Status: AC
Start: 2017-04-04 — End: 2017-04-04
  Filled 2017-04-04: qty 5

## 2017-04-04 MED ORDER — SCOPOLAMINE 1 MG/3DAYS TD PT72
MEDICATED_PATCH | TRANSDERMAL | Status: AC
  Filled 2017-04-04: qty 1

## 2017-04-04 MED ORDER — LACTATED RINGERS IV SOLN
INTRAVENOUS | Status: DC
  Administered 2017-04-04 (×2): via INTRAVENOUS

## 2017-04-04 MED ORDER — DEXAMETHASONE SODIUM PHOSPHATE 10 MG/ML IJ SOLN
INTRAMUSCULAR | Status: AC
  Filled 2017-04-04: qty 1

## 2017-04-04 MED ORDER — ONDANSETRON HCL 4 MG/2ML IJ SOLN
INTRAMUSCULAR | Status: DC | PRN
  Administered 2017-04-04: 4 mg via INTRAVENOUS

## 2017-04-04 MED ORDER — BUPIVACAINE-EPINEPHRINE (PF) 0.5% -1:200000 IJ SOLN
INTRAMUSCULAR | Status: AC
  Filled 2017-04-04: qty 30

## 2017-04-04 MED ORDER — ROCURONIUM BROMIDE 10 MG/ML (PF) SYRINGE
PREFILLED_SYRINGE | INTRAVENOUS | Status: AC
  Filled 2017-04-04: qty 5

## 2017-04-04 MED ORDER — LIDOCAINE 2% (20 MG/ML) 5 ML SYRINGE
INTRAMUSCULAR | Status: DC | PRN
  Administered 2017-04-04: 100 mg via INTRAVENOUS

## 2017-04-04 MED ORDER — SILVER NITRATE-POT NITRATE 75-25 % EX MISC
CUTANEOUS | Status: AC
  Filled 2017-04-04: qty 1

## 2017-04-04 MED ORDER — SODIUM CHLORIDE 0.9 % IJ SOLN
INTRAMUSCULAR | Status: AC
  Filled 2017-04-04: qty 10

## 2017-04-04 SURGICAL SUPPLY — 15 items
BRIEF STRETCH FOR OB PAD XXL (UNDERPADS AND DIAPERS) ×2 IMPLANT
CANISTER SUCT 3000ML PPV (MISCELLANEOUS) IMPLANT
CATH ROBINSON RED A/P 16FR (CATHETERS) IMPLANT
CLOTH BEACON ORANGE TIMEOUT ST (SAFETY) IMPLANT
CONTAINER PREFILL 10% NBF 60ML (FORM) IMPLANT
GLOVE BIOGEL PI IND STRL 7.0 (GLOVE) ×2 IMPLANT
GLOVE BIOGEL PI INDICATOR 7.0 (GLOVE) ×2
GLOVE ECLIPSE 6.5 STRL STRAW (GLOVE) ×2 IMPLANT
GLOVE ECLIPSE 7.0 STRL STRAW (GLOVE) ×2 IMPLANT
GOWN STRL REUS W/TWL LRG LVL3 (GOWN DISPOSABLE) ×4 IMPLANT
PACK VAGINAL MINOR WOMEN LF (CUSTOM PROCEDURE TRAY) ×2 IMPLANT
PAD OB MATERNITY 4.3X12.25 (PERSONAL CARE ITEMS) ×2 IMPLANT
PAD PREP 24X48 CUFFED NSTRL (MISCELLANEOUS) ×2 IMPLANT
SET GENESYS HTA PROCERVA (MISCELLANEOUS) ×2 IMPLANT
TOWEL OR 17X24 6PK STRL BLUE (TOWEL DISPOSABLE) ×2 IMPLANT

## 2017-04-04 NOTE — Anesthesia Postprocedure Evaluation (Signed)
Anesthesia Post Note  [name]  Procedure(s) Performed: DILATATION & CURETTAGE/HYSTEROSCOPY WITH HYDROTHERMAL ABLATION (N/A Vagina )     Patient location during evaluation: PACU Anesthesia Type: General Level of consciousness: sedated Pain management: pain level controlled Vital Signs Assessment: post-procedure vital signs reviewed and stable Respiratory status: spontaneous breathing and respiratory function stable Cardiovascular status: stable Postop Assessment: no apparent nausea or vomiting Anesthetic complications: no    Last Vitals:  Vitals:   04/04/17 1115 04/04/17 1132  BP: 107/68 119/80  Pulse: 95 95  Resp: 18 18  Temp:  36.9 C  SpO2: 100% 100%    Last Pain:  Vitals:   04/04/17 1132  TempSrc: Oral  PainSc: 0-No pain                 Dana Dorner DANIEL

## 2017-04-04 NOTE — Anesthesia Procedure Notes (Signed)
Procedure Name: LMA Insertion Date/Time: 04/04/2017 9:52 AM Performed by: Ronnette Hila, CRNA Pre-anesthesia Checklist: Patient identified, Emergency Drugs available, Suction available and Patient being monitored Patient Re-evaluated:Patient Re-evaluated prior to induction Oxygen Delivery Method: Circle system utilized Preoxygenation: Pre-oxygenation with 100% oxygen Induction Type: IV induction Ventilation: Mask ventilation without difficulty LMA: LMA inserted LMA Size: 4.0 Number of attempts: 1 Airway Equipment and Method: Bite block Placement Confirmation: positive ETCO2 Tube secured with: Tape Dental Injury: Teeth and Oropharynx as per pre-operative assessment

## 2017-04-04 NOTE — Discharge Instructions (Signed)
Hysteroscopy, Care After °Refer to this sheet in the next few weeks. These instructions provide you with information on caring for yourself after your procedure. Your health care provider may also give you more specific instructions. Your treatment has been planned according to current medical practices, but problems sometimes occur. Call your health care provider if you have any problems or questions after your procedure. °What can I expect after the procedure? °After your procedure, it is typical to have the following: °· You may have some cramping. This normally lasts for a couple days. °· You may have bleeding. This can vary from light spotting for a few days to menstrual-like bleeding for 3-7 days. ° °Follow these instructions at home: °· Rest for the first 1-2 days after the procedure. °· Only take over-the-counter or prescription medicines as directed by your health care provider. Do not take aspirin. It can increase the chances of bleeding. °· Take showers instead of baths for 2 weeks or as directed by your health care provider. °· Do not drive for 24 hours or as directed. °· Do not drink alcohol while taking pain medicine. °· Do not use tampons, douche, or have sexual intercourse for 2 weeks or until your health care provider says it is okay. °· Take your temperature twice a day for 4-5 days. Write it down each time. °· Follow your health care provider's advice about diet, exercise, and lifting. °· If you develop constipation, you may: °? Take a mild laxative if your health care provider approves. °? Add bran foods to your diet. °? Drink enough fluids to keep your urine clear or pale yellow. °· Try to have someone with you or available to you for the first 24-48 hours, especially if you were given a general anesthetic. °· Follow up with your health care provider as directed. °Contact a health care provider if: °· You feel dizzy or lightheaded. °· You feel sick to your stomach (nauseous). °· You have  abnormal vaginal discharge. °· You have a rash. °· You have pain that is not controlled with medicine. °Get help right away if: °· You have bleeding that is heavier than a normal menstrual period. °· You have a fever. °· You have increasing cramps or pain, not controlled with medicine. °· You have new belly (abdominal) pain. °· You pass out. °· You have pain in the tops of your shoulders (shoulder strap areas). °· You have shortness of breath. °This information is not intended to replace advice given to you by your health care provider. Make sure you discuss any questions you have with your health care provider. °Document Released: 12/18/2012 Document Revised: 08/05/2015 Document Reviewed: 09/26/2012 °Elsevier Interactive Patient Education © 2017 [employer] ° ° °Post Anesthesia Home Care Instructions ° °Activity: °Get plenty of rest for the remainder of the day. A responsible individual must stay with you for 24 hours following the procedure.  °For the next 24 hours, DO NOT: °-Drive a car °-Operate machinery °-Drink alcoholic beverages °-Take any medication unless instructed by your physician °-Make any legal decisions or sign important papers. ° °Meals: °Start with liquid foods such as gelatin or soup. Progress to regular foods as tolerated. Avoid greasy, spicy, heavy foods. If nausea and/or vomiting occur, drink only clear liquids until the nausea and/or vomiting subsides. Call your physician if vomiting continues. ° °Special Instructions/Symptoms: °Your throat may feel dry or sore from the anesthesia or the breathing tube placed in your throat during surgery. If this causes discomfort, gargle   with warm salt water. The discomfort should disappear within 24 hours. ° °If you had a scopolamine patch placed behind your ear for the management of post- operative nausea and/or vomiting: ° °1. The medication in the patch is effective for 72 hours, after which it should be removed.  Wrap patch in a tissue and discard in  the trash. Wash hands thoroughly with soap and water. °2. You may remove the patch earlier than 72 hours if you experience unpleasant side effects which may include dry mouth, dizziness or visual disturbances. °3. Avoid touching the patch. Wash your hands with soap and water after contact with the patch. °  ° ° °

## 2017-04-04 NOTE — Transfer of Care (Signed)
Immediate Anesthesia Transfer of Care Note  [name]  Procedure(s) Performed: DILATATION & CURETTAGE/HYSTEROSCOPY WITH HYDROTHERMAL ABLATION (N/A Vagina )  Patient Location: PACU  Anesthesia Type:General  Level of Consciousness: sedated  Airway & Oxygen Therapy: Patient Spontanous Breathing and Patient connected to face mask oxygen  Post-op Assessment: Report given to RN and Post -op Vital signs reviewed and stable  Post vital signs: Reviewed and stable  Last Vitals:  Vitals:   04/04/17 0927  BP: 120/72  Pulse: 91  Resp: 16  Temp: 36.8 C  SpO2: 100%    Last Pain:  Vitals:   04/04/17 0927  TempSrc: Oral  PainSc: 8          Complications: No apparent anesthesia complications

## 2017-04-04 NOTE — Interval H&P Note (Signed)
History and Physical Interval Note:  04/04/2017 9:26 AM  Kristin [name]  has presented today for surgery, with the diagnosis of Menorrhagia  The various methods of treatment have been discussed with the patient and family. After consideration of risks, benefits and other options for treatment, the patient has consented to  Procedure(s): DILATATION & CURETTAGE/HYSTEROSCOPY WITH HYDROTHERMAL ABLATION (N/A) as a surgical intervention .  The patient's history has been reviewed, patient examined, no change in status, stable for surgery.  I have reviewed the patient's chart and labs.  Questions were answered to the patient's satisfaction.     Reva Bores

## 2017-04-04 NOTE — Anesthesia Preprocedure Evaluation (Signed)
Anesthesia Evaluation  Patient identified by MRN, date of birth, ID band Patient awake    Reviewed: Allergy & Precautions, NPO status , Patient's Chart, lab work & pertinent test results  Airway Mallampati: II  TM Distance: >3 FB Neck ROM: Full    Dental no notable dental hx.    Pulmonary neg pulmonary ROS, former smoker,    Pulmonary exam normal breath sounds clear to auscultation       Cardiovascular hypertension, Pt. on medications Normal cardiovascular exam Rhythm:Regular Rate:Normal     Neuro/Psych  Headaches, PSYCHIATRIC DISORDERS Anxiety Depression Bipolar Disorder negative neurological ROS     GI/Hepatic negative GI ROS, Neg liver ROS,   Endo/Other  negative endocrine ROS  Renal/GU negative Renal ROS     Musculoskeletal  (+) Arthritis ,   Abdominal   Peds  Hematology negative hematology ROS (+)   Anesthesia Other Findings   Reproductive/Obstetrics negative OB ROS                             Anesthesia Physical Anesthesia Plan  ASA: II  Anesthesia Plan: General   Post-op Pain Management:    Induction: Intravenous  PONV Risk Score and Plan: 4 or greater and Ondansetron, Dexamethasone, Midazolam and Scopolamine patch - Pre-op  Airway Management Planned: LMA  Additional Equipment:   Intra-op Plan:   Post-operative Plan: Extubation in OR  Informed Consent: I have reviewed the patients History and Physical, chart, labs and discussed the procedure including the risks, benefits and alternatives for the proposed anesthesia with the patient or authorized representative who has indicated his/her understanding and acceptance.   Dental advisory given  Plan Discussed with: CRNA  Anesthesia Plan Comments:         Anesthesia Quick Evaluation

## 2017-04-04 NOTE — Op Note (Signed)
Preoperative diagnosis: Menorrhagia  Postoperative diagnosis: Menorrhagia  Procedure: HTA endometrial ablation, hysteroscopy, D & C  Surgeon: Shelbie Proctor. [name], M.D.  Anesthesia: Runell Gess, MD  Findings: Normal appearing cervix and uterine cavity with both ostia seen.  Estimated blood loss: Minimal  Specimens: Endometrial curettings  Disposition of specimen: Pathology  Reason for procedure: Patient is a 42 y.o. [id number] with abnormal bleeding who failed po meds and desired more definitive solution.   Procedure: Patient was taken to the OR where she was placed in dorsal lithotomy in Allen stirrups. SCDs were in place. An adequate timeout was obtained. The patient was prepped and draped in the usual sterile fashion. A speculum was placed inside the vagina and the cervix visualized. The cervix was grasped anteriorly with a single-tooth tenaculum. 20 cc of quarter percent Marcaine were injected for paracervical block. Sequential dilation was done to a #25 dilator, and the HTA with hysteroscope was introduced into the uterine cavity. The cervix and endometrial lining appeared normal both ostia were seen there was no deformity of the cavity. A seal test was done and was passed. The uterine cavity was heated to between 80 and 90C and HTA was performed for 10 minutes. Cooling was then performed and the instrument removed. Sharp curettage was then performed and sample sent to pathology. All instrument, needle, and lap counts were correct x 2. The patient was awakened taken to recovery room in stable condition.  Reva Bores MD 04/04/2017 10:31 AM

## 2017-04-05 ENCOUNTER — Encounter (HOSPITAL_BASED_OUTPATIENT_CLINIC_OR_DEPARTMENT_OTHER): Payer: Self-pay | Admitting: Family Medicine

## 2017-04-21 NOTE — OR Nursing (Signed)
RN spoke with Kristin [name] who calls today to ask about continued vaginal discharge after surgery on 04/04/2017.  RN gave contact information to surgeon office and instructed patient to review discharge instructions and call surgeon if she has further concerns.  RN also instructed Kristin. Jesus to go to the ER for heavy vaginal bleeding.  Kristin [name] reports no other symptoms other than continued discharge and states she will review discharge instructions and will call office/on-call physician if she has questions/problems or will go to ER if vaginal bleeding is heavy.

## 2017-04-26 ENCOUNTER — Ambulatory Visit (INDEPENDENT_AMBULATORY_CARE_PROVIDER_SITE_OTHER): Payer: BLUE CROSS/BLUE SHIELD | Admitting: Family Medicine

## 2017-04-26 VITALS — BP 147/94 | HR 86 | Wt 199.5 lb

## 2017-04-26 DIAGNOSIS — Z09 Encounter for follow-up examination after completed treatment for conditions other than malignant neoplasm: Secondary | ICD-10-CM

## 2017-04-26 DIAGNOSIS — Z113 Encounter for screening for infections with a predominantly sexual mode of transmission: Secondary | ICD-10-CM

## 2017-04-26 DIAGNOSIS — Z9889 Other specified postprocedural states: Secondary | ICD-10-CM

## 2017-04-26 DIAGNOSIS — N898 Other specified noninflammatory disorders of vagina: Secondary | ICD-10-CM

## 2017-04-26 NOTE — Progress Notes (Signed)
Patient stated she is having yellow watery discharge that has a foul odor since the surgery. Started period today.

## 2017-04-26 NOTE — Progress Notes (Signed)
    Subjective:    Patient ID: Kristin [name] is a 42 y.o. female presenting with Follow-up  on 04/26/2017  HPI: Having some foul smelling discharge. S/p HTA on 1/23. Notes some mid abdominal pain. Having light spotting presently. Denies fever, chills, disruption in bowel or bladder habits.  Review of Systems  Constitutional: Negative for chills and fever.  Respiratory: Negative for shortness of breath.   Cardiovascular: Negative for chest pain.  Gastrointestinal: Negative for abdominal pain, nausea and vomiting.  Genitourinary: Negative for dysuria.  Skin: Negative for rash.      Objective:    BP (!) 147/94   Pulse 86   Wt 199 lb 8 oz (90.5 kg)   LMP 04/26/2017   BMI 33.72 kg/m  Physical Exam  Constitutional: She is oriented to person, place, and time. She appears well-developed and well-nourished. No distress.  HENT:  Head: Normocephalic and atraumatic.  Eyes: No scleral icterus.  Neck: Neck supple.  Cardiovascular: Normal rate.  Pulmonary/Chest: Effort normal.  Abdominal: Soft.  Genitourinary:  Genitourinary Comments: Foul-smelling pink discharge noted.  Neurological: She is alert and oriented to person, place, and time.  Skin: Skin is warm and dry.  Psychiatric: She has a normal mood and affect.        Assessment & Plan:  Vaginal odor - check wet prep--though likely normal postop - Plan: Cervicovaginal ancillary only  Postop check - suspect odor is from HTA--reassured--should resolve in next 1-2 wks.  Return in about 3 months (around 07/24/2017) for a follow-up.   Reva Bores 04/26/2017 4:00 PM

## 2017-04-27 ENCOUNTER — Encounter: Payer: Self-pay | Admitting: Family Medicine

## 2017-04-27 LAB — CERVICOVAGINAL ANCILLARY ONLY
Bacterial vaginitis: POSITIVE — AB
CHLAMYDIA, DNA PROBE: NEGATIVE
Candida vaginitis: NEGATIVE
NEISSERIA GONORRHEA: NEGATIVE
Trichomonas: NEGATIVE

## 2017-04-29 ENCOUNTER — Other Ambulatory Visit: Payer: Self-pay | Admitting: Family Medicine

## 2017-04-29 DIAGNOSIS — N760 Acute vaginitis: Secondary | ICD-10-CM

## 2017-04-29 DIAGNOSIS — B9689 Other specified bacterial agents as the cause of diseases classified elsewhere: Secondary | ICD-10-CM

## 2017-04-29 MED ORDER — METRONIDAZOLE 500 MG PO TABS: 500.0000 mg | ORAL_TABLET | Freq: Two times a day (BID) | ORAL | 0 refills | Status: DC

## 2017-05-01 ENCOUNTER — Telehealth: Payer: Self-pay | Admitting: General Practice

## 2017-05-01 NOTE — Telephone Encounter (Signed)
Called patient & informed her of results, medication at pharmacy & instructions. Patient aware to avoid alcohol while on the medication. Patient verbalized understanding to all & had no questions

## 2017-05-01 NOTE — Telephone Encounter (Signed)
-----   Message from Reva Bores, MD sent at 04/29/2017  8:55 AM EST ----- Has BV--rx sent in

## 2017-05-10 ENCOUNTER — Encounter (HOSPITAL_COMMUNITY): Payer: Self-pay | Admitting: Emergency Medicine

## 2017-05-10 ENCOUNTER — Other Ambulatory Visit: Payer: Self-pay

## 2017-05-10 ENCOUNTER — Ambulatory Visit (HOSPITAL_COMMUNITY)
Admission: EM | Admit: 2017-05-10 | Discharge: 2017-05-10 | Disposition: A | Discharge status: Home / Self Care | Payer: BLUE CROSS/BLUE SHIELD | Attending: Internal Medicine | Admitting: Internal Medicine

## 2017-05-10 DIAGNOSIS — Z3201 Encounter for pregnancy test, result positive: Secondary | ICD-10-CM

## 2017-05-10 DIAGNOSIS — L309 Dermatitis, unspecified: Secondary | ICD-10-CM

## 2017-05-10 LAB — POCT PREGNANCY, URINE: Preg Test, Ur: POSITIVE — AB

## 2017-05-10 MED ORDER — TRIAMCINOLONE ACETONIDE 0.1 % EX CREA: 1.0000 "application " | TOPICAL_CREAM | Freq: Two times a day (BID) | CUTANEOUS | 0 refills | Status: DC

## 2017-05-10 NOTE — ED Triage Notes (Signed)
Pt c/o flair up for eczema and is requesting some cream. Pt also states she took a home preg test and it was positive and wants to be sure shes pregnant. Pt endorses vomiting x1 week.

## 2017-05-10 NOTE — ED Provider Notes (Signed)
[facility]    CSN: [id number] Arrival date & time: 05/10/17  1902     History   Chief Complaint Chief Complaint  Patient presents with  . Rash  . Emesis    HPI Kristin [name] is a 42 y.o. female.   Jully presents with request for refill of her kenalog for eczema lesions to her elbows. She also requests confirmation of pregnancy as she had a positive home test 2/23. She has been vomiting once a day. She has had some spotting. She had a d&c with ablation 1/23 and has had spotting since. Denies pelvic or abdominal pain. She has had three children and three total pregnancies. She takes gabapentin and lisinopril/hydrochlorothiazide daily. Hx of anxiety, depression, eczema, htn.    ROS per HPI.       Past Medical History:  Diagnosis Date  . Anxiety   . Arthritis   . Depression   . Eczema   . Hypertension   . Migraine headache     Patient Active Problem List   Diagnosis Date Noted  . Back pain 08/25/2016  . Generalized anxiety disorder 08/23/2015  . Knee pain 12/21/2014  . Menorrhagia 09/04/2012  . Allergic rhinitis 06/12/2012  . Eczema 03/26/2012  . Alcohol abuse 10/26/2010  . Bipolar 1 disorder (HCC) 10/09/2010  . Panic attacks 10/09/2010  . INSOMNIA, CHRONIC 06/29/2009  . TOBACCO USER 01/23/2009  . Obesity 10/29/2008  . Essential hypertension 12/20/2006    Past Surgical History:  Procedure Laterality Date  . DILITATION & CURRETTAGE/HYSTROSCOPY WITH HYDROTHERMAL ABLATION N/A 04/04/2017   Procedure: DILATATION & CURETTAGE/HYSTEROSCOPY WITH HYDROTHERMAL ABLATION;  Surgeon: Reva Bores, MD;  Location: [facility];  Service: Gynecology;  Laterality: N/A;  . TUBAL LIGATION      OB History    Gravida Para Term Preterm AB Living   4 3 3   1 3    SAB TAB Ectopic Multiple Live Births     1             Home Medications    Prior to Admission medications   Medication Sig Start Date End Date Taking? Authorizing Provider    cetirizine (ZYRTEC ALLERGY) 10 MG tablet Take 1 tablet (10 mg total) by mouth daily. 02/27/17   Almon Hercules, MD  clonazePAM (KLONOPIN) 0.5 MG tablet Take 1 tablet (0.5 mg total) by mouth 2 (two) times daily as needed for anxiety. 10/30/16   Renne Musca, MD  clotrimazole-betamethasone (LOTRISONE) cream Apply 1 application topically 2 (two) times daily. 09/07/16   Rumley, Lora Havens, DO  cyclobenzaprine (FLEXERIL) 10 MG tablet Take 1 tablet (10 mg total) by mouth 2 (two) times daily as needed for muscle spasms. 03/16/17   Khatri, Hina, PA-C  diclofenac (CATAFLAM) 50 MG tablet Take 1 tablet (50 mg total) by mouth 3 (three) times daily. One tablet TID with food prn pain. Patient taking differently: Take 50 mg by mouth 3 (three) times daily as needed. One tablet TID with food prn pain. 09/16/16   Hayden Rasmussen, NP  fluticasone (FLONASE) 50 MCG/ACT nasal spray Place 2 sprays into both nostrils daily. 02/25/17   Cathie Hoops, Amy V, PA-C  gabapentin (NEURONTIN) 300 MG capsule Take 1 capsule (300 mg total) by mouth at bedtime. You may take up to 3 times a day if no improvement. 12/05/16   Almon Hercules, MD  lisinopril-hydrochlorothiazide (PRINZIDE,ZESTORETIC) 10-12.5 MG tablet Take 1 tablet by mouth daily.    [provider]  metroNIDAZOLE (  FLAGYL) 500 MG tablet Take 1 tablet (500 mg total) by mouth 2 (two) times daily. Patient not taking: Reported on 05/10/2017 04/29/17   Reva Bores, MD  naproxen (NAPROSYN) 500 MG tablet Take 1 tablet (500 mg total) by mouth 2 (two) times daily. 03/16/17   Khatri, Hina, PA-C  oxyCODONE-acetaminophen (PERCOCET/ROXICET) 5-325 MG tablet Take 1-2 tablets by mouth every 6 (six) hours as needed. Patient not taking: Reported on 05/10/2017 04/04/17   Reva Bores, MD  terbinafine (LAMISIL) 1 % cream Apply 1 application topically 2 (two) times daily. 02/27/17   Almon Hercules, MD  triamcinolone cream (KENALOG) 0.1 % Apply 1 application topically 2 (two) times daily. 05/10/17   Georgetta Haber, NP    Family History Family History  Problem Relation Age of Onset  . Cancer Maternal Grandmother        Breast  . Hypertension Mother   . Hypertension Brother   . Cancer Maternal Uncle        colon    Social History Social History   Tobacco Use  . Smoking status: Former Smoker    Packs/day: 0.25    Years: 5.00    Pack years: 1.25  . Smokeless tobacco: Never Used  Substance Use Topics  . Alcohol use: Yes    Comment: occassionally   . Drug use: No     Allergies   Sumatriptan   Review of Systems Review of Systems   Physical Exam Triage Vital Signs ED Triage Vitals [05/10/17 1940]  Enc Vitals Group     BP (!) 141/91     Pulse Rate 80     Resp 18     Temp 98.6 F (37 C)     Temp src      SpO2 100 %     Weight      Height      Head Circumference      Peak Flow      Pain Score 0     Pain Loc      Pain Edu?      Excl. in GC?    No data found.  Updated Vital Signs BP (!) 141/91   Pulse 80   Temp 98.6 F (37 C)   Resp 18   LMP 04/26/2017   SpO2 100%   Visual Acuity Right Eye Distance:   Left Eye Distance:   Bilateral Distance:    Right Eye Near:   Left Eye Near:    Bilateral Near:     Physical Exam  Constitutional: She is oriented to person, place, and time. She appears well-developed and well-nourished. No distress.  Cardiovascular: Normal rate, regular rhythm and normal heart sounds.  Pulmonary/Chest: Effort normal and breath sounds normal.  Abdominal: Soft. She exhibits no distension. There is no tenderness. There is no guarding.  Neurological: She is alert and oriented to person, place, and time.  Skin: Skin is warm and dry. Rash noted.  Scaly rash to bilateral elbows, without redness, tenderness or drainage     UC Treatments / Results  Labs (all labs ordered are listed, but only abnormal results are displayed) Labs Reviewed  POCT PREGNANCY, URINE - Abnormal; Notable for the following components:      Result Value    Preg Test, Ur POSITIVE (*)    All other components within normal limits    EKG  EKG Interpretation None       Radiology No results found.  Procedures Procedures (including critical  care time)  Medications Ordered in UC Medications - No data to display   Initial Impression / Assessment and Plan / UC Course  I have reviewed the triage vital signs and the nursing notes.  Pertinent labs & imaging results that were available during my care of the patient were reviewed by me and considered in my medical decision making (see chart for details).     Positive urine pregnancy. Discussed with patient that her bp medication needs to be discontinued, patient states she would like to call her doctor tomorrow rather than have her medications changed tonight. Discussed to d/c use of gabapentin as well. Tylenol as needed. Kenalog for eczema. Encouraged follow up with OB as still with spotting. Patient verbalized understanding and agreeable to plan.     Final Clinical Impressions(s) / UC Diagnoses   Final diagnoses:  Eczema, unspecified type  Positive urine pregnancy test    ED Discharge Orders        Ordered    triamcinolone cream (KENALOG) 0.1 %  2 times daily     05/10/17 2024       Controlled Substance Prescriptions [place] Controlled Substance Registry consulted? Not Applicable   Georgetta Haber, NP 05/10/17 2044

## 2017-05-10 NOTE — Discharge Instructions (Signed)
Please follow up with your PCP and OB for your medication management as your medications are not safe during pregnancy. You will need your blood pressure medication changed.  Follow up with OB due to your continued spotting and for pregnancy follow up and management. If develop increased pain, bleeding or otherwise worsening please return to be seen or go to [facility].   For morning sickness: Doxylamine is available in some over-the-counter sleeping pills (eg, Unisom Sleep Tabs): One-half of the 25 mg over-the-counter tablet or two chewable 5 mg tablets can be used off-label as an antiemetic. In addition, pyridoxine (vitamin B6) 25 mg, also available over-the-counter, can be taken three or four times per day along with 12.5 mg of doxylamine

## 2017-06-14 ENCOUNTER — Encounter: Payer: Self-pay | Admitting: Student

## 2017-06-14 ENCOUNTER — Ambulatory Visit (INDEPENDENT_AMBULATORY_CARE_PROVIDER_SITE_OTHER): Payer: BLUE CROSS/BLUE SHIELD | Admitting: Student

## 2017-06-14 ENCOUNTER — Other Ambulatory Visit: Payer: Self-pay

## 2017-06-14 VITALS — BP 118/80 | HR 79 | Temp 98.5°F | Ht 65.0 in | Wt 199.6 lb

## 2017-06-14 DIAGNOSIS — B353 Tinea pedis: Secondary | ICD-10-CM

## 2017-06-14 DIAGNOSIS — F319 Bipolar disorder, unspecified: Secondary | ICD-10-CM

## 2017-06-14 DIAGNOSIS — F411 Generalized anxiety disorder: Secondary | ICD-10-CM

## 2017-06-14 MED ORDER — TERBINAFINE HCL 1 % EX CREA: 1.0000 "application " | TOPICAL_CREAM | Freq: Two times a day (BID) | CUTANEOUS | 0 refills | Status: DC

## 2017-06-14 MED ORDER — QUETIAPINE FUMARATE 50 MG PO TABS: ORAL_TABLET | ORAL | 0 refills | Status: DC

## 2017-06-14 MED ORDER — SERTRALINE HCL 50 MG PO TABS: 50.0000 mg | ORAL_TABLET | Freq: Every day | ORAL | 3 refills | Status: DC

## 2017-06-14 NOTE — Patient Instructions (Signed)
It was great seeing you today! We have addressed the following issues today  Mood issue: We have started you on Seroquel and Zoloft today.  Please do not take Zoloft without Seroquel.  You may take Seroquel without Zoloft.  Follow the direction on the prescription.  Seek immediate care or call 911 if you feel like you are unsafe to self or someone.  Follow-up in 2 weeks.  Skin rash: We sent a prescription for terbinafine cream to your pharmacy.  Use this cream for 2 weeks.  We also recommend frequent foot wear washing and change.   If we did any lab work today, and the results require attention, either me or my nurse will get in touch with you. If everything is normal, you will get a letter in mail and a message via . If you don't hear from [place] in two weeks, please give [place] a call. Otherwise, we look forward to seeing you again at your next visit. If you have any questions or concerns before then, please call the clinic at [phone].  Please bring all your medications to every doctors visit  Sign up for My Chart to have easy access to your labs results, and communication with your Primary care physician.    Please check-out at the front desk before leaving the clinic.    Take Care,   Dr. Alanda Slim

## 2017-06-14 NOTE — Progress Notes (Signed)
Subjective:    Kristin [name] is a 42 y.o. old female here for "nerves"  HPI Patient with history of bipolar disorder and GAD who presents to clinic for "nerves" that she describes as shaking, tremor and nausea. She also reports difficulty falling sleep. She says she has been going through tough time since her brother was murdered about 9 months ago. She reports experiencing most of her symptoms everyday. Despite this, she is still functional and working although she says her anxiety attack sometimes makes it hard to drive. She is not on medication except Klonopin 8 months ago. She was given one month supply at that time. She says she has been using it sparingly since then.  Patient used to go to [facility] in the past. I recommended going back [facility] to follow up on her  when I saw her for her mood issue over 9 months ago. She says she tried to go to [facility] but the wait was so long and she had to go back to work before she could see someone. She says she was on [place] about three years ago. She couldn't continue due to insurance.   PMH/Problem List: has Obesity; TOBACCO USER; INSOMNIA, CHRONIC; Essential hypertension; Bipolar 1 disorder (HCC); Panic attacks; Alcohol abuse; Eczema; Allergic rhinitis; Menorrhagia; Knee pain; Generalized anxiety disorder; and Back pain on their problem list.   has a past medical history of Anxiety, Arthritis, Depression, Eczema, Hypertension, and Migraine headache.  FH:  Family History  Problem Relation Age of Onset  . Cancer Maternal Grandmother        Breast  . Hypertension Mother   . Hypertension Brother   . Cancer Maternal Uncle        colon    SH Social History   Tobacco Use  . Smoking status: Former Smoker    Packs/day: 0.25    Years: 5.00    Pack years: 1.25  . Smokeless tobacco: Never Used  Substance Use Topics  . Alcohol use: Yes    Comment: occassionally   . Drug use: No    Review of Systems Review of systems negative except for pertinent  positives and negatives in history of present illness above.     Objective:     Vitals:   06/14/17 1543  BP: 118/80  Pulse: 79  Temp: 98.5 F (36.9 C)  TempSrc: Oral  SpO2: 96%  Weight: 199 lb 9.6 oz (90.5 kg)  Height: 5\' 5"  (1.651 m)   Body mass index is 33.22 kg/m.  Physical Exam  GEN: appears well & comfortable. No apparent distress. CVS: RRR, nl s1 & s2, no murmurs, no edema RESP: no IWOB, good air movement bilaterally, CTAB GI: BS present & normal, soft, NTND MSK: no focal tenderness or notable swelling SKIN: no apparent skin lesion NEURO: alert and oiented appropriately, no gross deficits   PSYCH: neatly groomed and appropriately dressed. Maintains good eye contact and is cooperative and attentive. Speech is normal volume and rate. Affect is appropriate. Thought process is logical and goal directed. No suicidal or homicidal ideation. Does not appear to be responding to any internal stimuli. Able to maintain train of thought and concentrate on the questions.   Depression screen [facility] 2/9 06/14/2017 03/16/2017 02/27/2017 01/10/2017 12/26/2016  Decreased Interest 3 2 0 3 -  Down, Depressed, Hopeless 1 0 0 3 1  PHQ - 2 Score 4 2 0 6 1  Altered sleeping 3 3 - 3 3  Tired, decreased energy 3 3 - 3 3  Change in appetite 3 2 - 2 3  Feeling bad or failure about yourself  0 0 - 1 0  Trouble concentrating 3 3 - 3 3  Moving slowly or fidgety/restless 1 0 - 0 0  Suicidal thoughts 0 0 - 1 0  PHQ-9 Score 17 13 - 19 13  Difficult doing work/chores Somewhat difficult - - - -   GAD 7 : Generalized Anxiety Score 06/14/2017 03/16/2017 01/10/2017 12/26/2016  Nervous, Anxious, on Edge 3 1 3 3   Control/stop worrying 2 1 3 2   Worry too much - different things 2 1 3 3   Trouble relaxing 3 3 3 3   Restless 3 0 3 3  Easily annoyed or irritable 3 3 3 3   Afraid - awful might happen 2 0 2 3  Total GAD 7 Score 18 9 20 20   Anxiety Difficulty Somewhat difficult - - -   She filled MDQ which was  positive for some manic symptoms as well.  Assessment and Plan:  1. Generalized anxiety disorder: GAD-7 elevated to 18. She reports daily symptoms. Given frequency of her symptoms she would benefit from daily medication such as SSRI. Discussed the risks with daily use of benzodiazepines. She is open to the recommendation. Will start Zoloft at 50 mg daily. We may need to titrate up this. Given history of BPD-1 and insomnia, will add Seroquel 50 mg HS for two days followed by 100 mg HS for two days and 200 mg HS then after. Discussed the risk of worsening mania including but not limited to SI & HI with Zoloft. Stressed the importance of taking her Seroquel. If not able to take, should stop Zoloft as well and let us know. Encouraged her to call 911 or crisis line if she feels unsafe to self or other. Follow up in two weeks or sooner if needed. Also referral to Psychiatry.  2. Bipolar 1 disorder [facility]): patient with both manic and depressive symptoms. PHQ-9 elevated to 17 suggestive for severe depression. Will initiate Zoloft and Seroquel as above - Ambulatory referral to Psychiatry  3. Tinea pedis of both feet: at the end of the encounter, patient brought up about some skin lesion and excessive sweating of her feet. She reports some improvement with antifungal cream in the past. This could also be due to corynebacterium minutissimum. If she fails to respond to terbinafine cream, will treat for the later. Emphasized the importance of foot hygiene and washing foot wears regularly.  - terbinafine (LAMISIL) 1 % cream; Apply 1 application topically 2 (two) times daily.  Dispense: 30 g; Refill: 0   No follow-ups on file.  Almon Hercules, MD 06/14/17 Pager: [phone]

## 2017-06-15 ENCOUNTER — Encounter: Payer: Self-pay | Admitting: Student

## 2017-06-15 NOTE — Assessment & Plan Note (Signed)
GAD-7 elevated to 18. She reports daily symptoms. Given frequency of her symptoms she would benefit from daily medication such as SSRI. Discussed the risks with daily use of benzodiazepines. She is open to the recommendation. Will start Zoloft at 50 mg daily. We may need to titrate up this. Given history of BPD-1 and insomnia, will add Seroquel 50 mg HS for two days followed by 100 mg HS for two days and 200 mg HS then after. Discussed the risk of worsening mania including but not limited to SI & HI with Zoloft. Stressed the importance of taking her Seroquel. If not able to take, should stop Zoloft as well and let us know. Encouraged her to call 911 or crisis line if she feels unsafe to self or other. Follow up in two weeks or sooner if needed. Also referral to Psychiatry.

## 2017-06-15 NOTE — Assessment & Plan Note (Signed)
Patient with both manic and depressive symptoms. PHQ-9 elevated to 17 suggestive for severe depression. Will initiate Zoloft and Seroquel as above - Ambulatory referral to Psychiatry

## 2017-06-19 ENCOUNTER — Telehealth: Payer: Self-pay | Admitting: Student

## 2017-06-19 NOTE — Telephone Encounter (Signed)
Pt called and said she went to pick up her meds from the pharmacy after her appt on 4/4 and the pharmacy did not get the seroquel. It looks like that one was printed instead of sent electronically. Please advise

## 2017-06-19 NOTE — Telephone Encounter (Signed)
We gave her paper prescription because the direction on the prescription was long. Can someone reprint or call in to her pharmacy please.  Thanks! Alwyn Ren

## 2017-06-21 NOTE — Telephone Encounter (Signed)
Contacted pt pharmacy to see if the Rx had been filled or picked up and they said it was there but they were just waiting on something with insurance.  I contacted pt and told her that the Rx was at pharmacy and that they were waiting on something with insurance.  Not sure if it was them needing her information or if it was a pre authorization.  Told pt she may want to take her insurance information to them and see what is going on.  Pharmacy said that things were crazy there as they are transitioning from [facility] to [facility]. Pt stated she would go and check into this. Lamonte Sakai, Zakariya Knickerbocker D, [place]

## 2017-07-04 ENCOUNTER — Other Ambulatory Visit: Payer: Self-pay | Admitting: Student

## 2017-07-04 DIAGNOSIS — M5442 Lumbago with sciatica, left side: Principal | ICD-10-CM

## 2017-07-04 DIAGNOSIS — G8929 Other chronic pain: Secondary | ICD-10-CM

## 2017-07-04 DIAGNOSIS — M5441 Lumbago with sciatica, right side: Principal | ICD-10-CM

## 2017-07-06 ENCOUNTER — Other Ambulatory Visit: Payer: Self-pay | Admitting: Family Medicine

## 2017-07-06 DIAGNOSIS — Z1231 Encounter for screening mammogram for malignant neoplasm of breast: Secondary | ICD-10-CM

## 2017-08-01 ENCOUNTER — Ambulatory Visit: Payer: Self-pay

## 2017-08-18 ENCOUNTER — Ambulatory Visit (HOSPITAL_COMMUNITY)
Admission: EM | Admit: 2017-08-18 | Discharge: 2017-08-18 | Disposition: A | Discharge status: Home / Self Care | Payer: Self-pay | Attending: Internal Medicine | Admitting: Internal Medicine

## 2017-08-18 ENCOUNTER — Other Ambulatory Visit: Payer: Self-pay

## 2017-08-18 ENCOUNTER — Encounter (HOSPITAL_COMMUNITY): Payer: Self-pay

## 2017-08-18 DIAGNOSIS — I10 Essential (primary) hypertension: Secondary | ICD-10-CM | POA: Insufficient documentation

## 2017-08-18 DIAGNOSIS — R109 Unspecified abdominal pain: Secondary | ICD-10-CM | POA: Insufficient documentation

## 2017-08-18 DIAGNOSIS — Z79899 Other long term (current) drug therapy: Secondary | ICD-10-CM | POA: Insufficient documentation

## 2017-08-18 DIAGNOSIS — E860 Dehydration: Secondary | ICD-10-CM | POA: Insufficient documentation

## 2017-08-18 DIAGNOSIS — R112 Nausea with vomiting, unspecified: Secondary | ICD-10-CM | POA: Insufficient documentation

## 2017-08-18 DIAGNOSIS — K219 Gastro-esophageal reflux disease without esophagitis: Secondary | ICD-10-CM | POA: Insufficient documentation

## 2017-08-18 DIAGNOSIS — F419 Anxiety disorder, unspecified: Secondary | ICD-10-CM | POA: Insufficient documentation

## 2017-08-18 DIAGNOSIS — Z8249 Family history of ischemic heart disease and other diseases of the circulatory system: Secondary | ICD-10-CM | POA: Insufficient documentation

## 2017-08-18 DIAGNOSIS — F329 Major depressive disorder, single episode, unspecified: Secondary | ICD-10-CM | POA: Insufficient documentation

## 2017-08-18 DIAGNOSIS — Z87891 Personal history of nicotine dependence: Secondary | ICD-10-CM | POA: Insufficient documentation

## 2017-08-18 LAB — POCT URINALYSIS DIP (DEVICE)
GLUCOSE, UA: NEGATIVE mg/dL
Hgb urine dipstick: NEGATIVE
Ketones, ur: 40 mg/dL — AB
LEUKOCYTES UA: NEGATIVE
Nitrite: NEGATIVE
Protein, ur: NEGATIVE mg/dL
Urobilinogen, UA: 1 mg/dL (ref 0.0–1.0)
pH: 5.5 (ref 5.0–8.0)

## 2017-08-18 LAB — POCT I-STAT, CHEM 8
BUN: 14 mg/dL (ref 6–20)
CHLORIDE: 96 mmol/L — AB (ref 101–111)
CREATININE: 1.2 mg/dL — AB (ref 0.44–1.00)
Calcium, Ion: 1.14 mmol/L — ABNORMAL LOW (ref 1.15–1.40)
Glucose, Bld: 66 mg/dL (ref 65–99)
HEMATOCRIT: 41 % (ref 36.0–46.0)
HEMOGLOBIN: 13.9 g/dL (ref 12.0–15.0)
POTASSIUM: 3.8 mmol/L (ref 3.5–5.1)
Sodium: 134 mmol/L — ABNORMAL LOW (ref 135–145)
TCO2: 22 mmol/L (ref 22–32)

## 2017-08-18 LAB — POCT PREGNANCY, URINE: PREG TEST UR: NEGATIVE

## 2017-08-18 MED ORDER — ONDANSETRON HCL 4 MG/2ML IJ SOLN
INTRAMUSCULAR | Status: AC
  Filled 2017-08-18: qty 2

## 2017-08-18 MED ORDER — ONDANSETRON HCL 4 MG/2ML IJ SOLN
4.0000 mg | Freq: Once | INTRAMUSCULAR | Status: AC
  Administered 2017-08-18: 4 mg via INTRAMUSCULAR

## 2017-08-18 MED ORDER — ONDANSETRON 4 MG PO TBDP
ORAL_TABLET | ORAL | Status: AC
  Filled 2017-08-18: qty 1

## 2017-08-18 MED ORDER — ONDANSETRON HCL 4 MG PO TABS: 4.0000 mg | ORAL_TABLET | Freq: Four times a day (QID) | ORAL | 0 refills | Status: DC

## 2017-08-18 MED ORDER — ONDANSETRON 4 MG PO TBDP
4.0000 mg | ORAL_TABLET | Freq: Once | ORAL | Status: AC
  Administered 2017-08-18: 4 mg via ORAL

## 2017-08-18 NOTE — Discharge Instructions (Addendum)
Zofran given in office Get rest and drink fluids Zofran prescribed.  Take as directed.    DIET Instructions: 30 minutes after taking nausea medicine, begin with sips of clear liquids. If able to hold down 2 - 4 ounces for 30 minutes, begin drinking more. Increase your fluid intake to replace losses. Clear liquids only for 24 hours (water, tea, sport drinks, clear flat ginger ale or cola and juices, broth, jello, popsicles, ect). Advance to bland foods, applesauce, rice, baked or boiled chicken, ect. Avoid milk, greasy foods and anything that doesn?t agree with you.  Follow up with PCP next week If you experience new or worsening symptoms return or go to ER If vomiting persists and you are unable to hold fluids or diarrhea persists more than 4 days, or becomes bloody, or if you develop high fever or abdominal pain, return here, see your doctor or go to the ER.

## 2017-08-18 NOTE — ED Triage Notes (Signed)
Pt presents today with vomiting, headache and some dizziness that has been going on for 3 weeks. States every day for 3 weeks straight she has not been able to keep anything on her stomach. States she does have some abdominal pain as well.

## 2017-08-18 NOTE — ED Provider Notes (Addendum)
[facility]   [id number] 08/18/17 Arrival Time: 1749  SUBJECTIVE:  Kristin [name] is a 42 y.o. female who presents with complaint of abdominal discomfort that began gradually 3 weeks ago.  Denies a precipitating event, or trauma.  Pain is diffuse about the abdomen.  Describes as intermittent and sharp in character.  Has tried acid reflux medication and TUMS without relief.  Reports worsening symptoms with eating and drinking.  Reports similar symptoms in the past and treated for acid reflux and started on nexium.  Last BM yesterday, normal for patient.  Complains of chills, decreased appetite, nausea  Reports 3x daily episodes of vomiting for the past 3 weeks.    Denies fever, chest pain, SOB, diarrhea, constipation, hematochezia, melena, dysuria, difficulty urinating, increased frequency or urgency, flank pain, loss of bowel or bladder function.  No LMP recorded.  ROS: As per HPI.  Past Medical History:  Diagnosis Date  . Anxiety   . Arthritis   . Depression   . Eczema   . Hypertension   . Migraine headache    Past Surgical History:  Procedure Laterality Date  . DILITATION & CURRETTAGE/HYSTROSCOPY WITH HYDROTHERMAL ABLATION N/A 04/04/2017   Procedure: DILATATION & CURETTAGE/HYSTEROSCOPY WITH HYDROTHERMAL ABLATION;  Surgeon: Reva Bores, MD;  Location: [facility];  Service: Gynecology;  Laterality: N/A;  . TUBAL LIGATION     Allergies  Allergen Reactions  . Sumatriptan Anaphylaxis   No current facility-administered medications on file prior to encounter.    Current Outpatient Medications on File Prior to Encounter  Medication Sig Dispense Refill  . cyclobenzaprine (FLEXERIL) 10 MG tablet Take 1 tablet (10 mg total) by mouth 2 (two) times daily as needed for muscle spasms. 20 tablet 0  . diclofenac (CATAFLAM) 50 MG tablet Take 1 tablet (50 mg total) by mouth 3 (three) times daily. One tablet TID with food prn pain. (Patient taking differently: Take  50 mg by mouth 3 (three) times daily as needed. One tablet TID with food prn pain.) 21 tablet 0  . fluticasone (FLONASE) 50 MCG/ACT nasal spray Place 2 sprays into both nostrils daily. 1 g 0  . gabapentin (NEURONTIN) 300 MG capsule TAKE 1 CAPSULE BY MOUTH AT BEDTIME*MAY TAKE UP TO 3 TIMES DAILY IF NO IMPROVEMENT 90 capsule 0  . lisinopril-hydrochlorothiazide (PRINZIDE,ZESTORETIC) 10-12.5 MG tablet Take 1 tablet by mouth daily.    . sertraline (ZOLOFT) 50 MG tablet Take 1 tablet (50 mg total) by mouth daily. 30 tablet 3  . terbinafine (LAMISIL) 1 % cream Apply 1 application topically 2 (two) times daily. 30 g 0  . triamcinolone cream (KENALOG) 0.1 % Apply 1 application topically 2 (two) times daily. 30 g 0  . cetirizine (ZYRTEC ALLERGY) 10 MG tablet Take 1 tablet (10 mg total) by mouth daily. 30 tablet 0  . clonazePAM (KLONOPIN) 0.5 MG tablet Take 1 tablet (0.5 mg total) by mouth 2 (two) times daily as needed for anxiety. 60 tablet 0  . clotrimazole-betamethasone (LOTRISONE) cream Apply 1 application topically 2 (two) times daily. 30 g 0  . metroNIDAZOLE (FLAGYL) 500 MG tablet Take 1 tablet (500 mg total) by mouth 2 (two) times daily. (Patient not taking: Reported on 05/10/2017) 14 tablet 0  . naproxen (NAPROSYN) 500 MG tablet Take 1 tablet (500 mg total) by mouth 2 (two) times daily. 30 tablet 0  . oxyCODONE-acetaminophen (PERCOCET/ROXICET) 5-325 MG tablet Take 1-2 tablets by mouth every 6 (six) hours as needed. (Patient not taking: Reported on  05/10/2017) 8 tablet 0  . QUEtiapine (SEROQUEL) 50 MG tablet Take 1 tablet (50 mg total) by mouth at bedtime for 2 days, THEN 2 tablets (100 mg total) at bedtime for 2 days, THEN 4 tablets (200 mg total) at bedtime. 126 tablet 0   Social History   Socioeconomic History  . Marital status: Single    Spouse name: Not on file  . Number of children: Not on file  . Years of education: Not on file  . Highest education level: Not on file  Occupational History  .  Not on file  Social Needs  . Financial resource strain: Not on file  . Food insecurity:    Worry: Not on file    Inability: Not on file  . Transportation needs:    Medical: Not on file    Non-medical: Not on file  Tobacco Use  . Smoking status: Former Smoker    Packs/day: 0.25    Years: 5.00    Pack years: 1.25  . Smokeless tobacco: Never Used  Substance and Sexual Activity  . Alcohol use: Yes    Comment: occassionally   . Drug use: No  . Sexual activity: Yes    Birth control/protection: Surgical  Lifestyle  . Physical activity:    Days per week: Not on file    Minutes per session: Not on file  . Stress: Not on file  Relationships  . Social connections:    Talks on phone: Not on file    Gets together: Not on file    Attends religious service: Not on file    Active member of club or organization: Not on file    Attends meetings of clubs or organizations: Not on file    Relationship status: Not on file  . Intimate partner violence:    Fear of current or ex partner: Not on file    Emotionally abused: Not on file    Physically abused: Not on file    Forced sexual activity: Not on file  Other Topics Concern  . Not on file  Social History Narrative  . Not on file   Family History  Problem Relation Age of Onset  . Cancer Maternal Grandmother        Breast  . Hypertension Mother   . Hypertension Brother   . Cancer Maternal Uncle        colon    OBJECTIVE:  Vitals:   08/18/17 1816  BP: 121/78  Pulse: 76  Resp: 16  Temp: 98.4 F (36.9 C)  TempSrc: Oral  SpO2: 99%    General appearance: AOx3 in no acute distress; appears uncomfortable  HEENT: NCAT.  Oropharynx clear.  Lungs: clear to auscultation bilaterally without adventitious breath sounds Heart: regular rate and rhythm.  Radial pulses 2+ symmetrical bilaterally Abdomen: soft, non-distended; normal active bowel sounds, nontender with auscultation examination; diffuse tenderness with light and deep  palpation about the abdomen; guarding; no rebound tenderness Back: no CVA tenderness Extremities: no edema; symmetrical with no gross deformities Skin: warm and dry Neurologic: normal gait Psychological: alert and cooperative; normal mood and affect  Labs: Results for orders placed or performed during the hospital encounter of 08/18/17 (from the past 24 hour(s))  POCT urinalysis dip (device)     Status: Abnormal   Collection Time: 08/18/17  7:25 PM  Result Value Ref Range   Glucose, UA NEGATIVE NEGATIVE mg/dL   Bilirubin Urine MODERATE (A) NEGATIVE   Ketones, ur 40 (A) NEGATIVE mg/dL  Specific Gravity, Urine >=1.030 1.005 - 1.030   Hgb urine dipstick NEGATIVE NEGATIVE   pH 5.5 5.0 - 8.0   Protein, ur NEGATIVE NEGATIVE mg/dL   Urobilinogen, UA 1.0 0.0 - 1.0 mg/dL   Nitrite NEGATIVE NEGATIVE   Leukocytes, UA NEGATIVE NEGATIVE  I-STAT, chem 8     Status: Abnormal   Collection Time: 08/18/17  7:32 PM  Result Value Ref Range   Sodium 134 (L) 135 - 145 mmol/L   Potassium 3.8 3.5 - 5.1 mmol/L   Chloride 96 (L) 101 - 111 mmol/L   BUN 14 6 - 20 mg/dL   Creatinine, Ser 1.61 (H) 0.44 - 1.00 mg/dL   Glucose, Bld 66 65 - 99 mg/dL   Calcium, Ion 0.96 (L) 1.15 - 1.40 mmol/L   TCO2 22 22 - 32 mmol/L   Hemoglobin 13.9 12.0 - 15.0 g/dL   HCT 04.5 40.9 - 81.1 %  Pregnancy, urine POC     Status: None   Collection Time: 08/18/17  7:34 PM  Result Value Ref Range   Preg Test, Ur NEGATIVE NEGATIVE    ASSESSMENT & PLAN:  1. Non-intractable vomiting with nausea, unspecified vomiting type   2. Dehydration     Meds ordered this encounter  Medications  . ondansetron (ZOFRAN-ODT) disintegrating tablet 4 mg  . ondansetron (ZOFRAN) 4 MG tablet    Sig: Take 1 tablet (4 mg total) by mouth every 6 (six) hours.    Dispense:  12 tablet    Refill:  0    Order Specific Question:   Supervising Provider    Answer:   Isa Rankin [id number]  . ondansetron (ZOFRAN) injection 4 mg   Patient  states she had an episode of vomiting following the administration of the medication.  Given zofran injection.    Get rest and drink fluids Zofran prescribed.  Take as directed.    DIET Instructions: 30 minutes after taking nausea medicine, begin with sips of clear liquids. If able to hold down 2 - 4 ounces for 30 minutes, begin drinking more. Increase your fluid intake to replace losses. Clear liquids only for 24 hours (water, tea, sport drinks, clear flat ginger ale or cola and juices, broth, jello, popsicles, ect). Advance to bland foods, applesauce, rice, baked or boiled chicken, ect. Avoid milk, greasy foods and anything that doesn't agree with you.  If symptoms persists follow up with PCP If you experience new or worsening symptoms return or go to ER If vomiting persists and you are unable to hold fluids or diarrhea persists more than 4 days, or becomes bloody, or if you develop high fever or abdominal pain, return here, see your doctor or go to the ER.    Reviewed expectations re: course of current medical issues. Questions answered. Outlined signs and symptoms indicating need for more acute intervention. Patient verbalized understanding. After Visit Summary given.   Rennis Harding, PA-C 08/18/17 1957    Rennis Harding, PA-C 08/18/17 1958

## 2017-08-20 ENCOUNTER — Emergency Department (HOSPITAL_COMMUNITY)
Admission: EM | Admit: 2017-08-20 | Discharge: 2017-08-21 | Disposition: A | Discharge status: Home / Self Care | Payer: Self-pay | Attending: Physician Assistant | Admitting: Physician Assistant

## 2017-08-20 ENCOUNTER — Other Ambulatory Visit: Payer: Self-pay

## 2017-08-20 ENCOUNTER — Emergency Department (HOSPITAL_COMMUNITY): Payer: Self-pay

## 2017-08-20 ENCOUNTER — Encounter (HOSPITAL_COMMUNITY): Payer: Self-pay | Admitting: Emergency Medicine

## 2017-08-20 DIAGNOSIS — R112 Nausea with vomiting, unspecified: Secondary | ICD-10-CM | POA: Insufficient documentation

## 2017-08-20 DIAGNOSIS — I10 Essential (primary) hypertension: Secondary | ICD-10-CM | POA: Insufficient documentation

## 2017-08-20 DIAGNOSIS — Z87891 Personal history of nicotine dependence: Secondary | ICD-10-CM | POA: Insufficient documentation

## 2017-08-20 DIAGNOSIS — Z79899 Other long term (current) drug therapy: Secondary | ICD-10-CM | POA: Insufficient documentation

## 2017-08-20 DIAGNOSIS — R911 Solitary pulmonary nodule: Secondary | ICD-10-CM | POA: Insufficient documentation

## 2017-08-20 DIAGNOSIS — K760 Fatty (change of) liver, not elsewhere classified: Secondary | ICD-10-CM | POA: Insufficient documentation

## 2017-08-20 DIAGNOSIS — R1084 Generalized abdominal pain: Secondary | ICD-10-CM | POA: Insufficient documentation

## 2017-08-20 LAB — URINALYSIS, ROUTINE W REFLEX MICROSCOPIC
GLUCOSE, UA: NEGATIVE mg/dL
Hgb urine dipstick: NEGATIVE
KETONES UR: 20 mg/dL — AB
Leukocytes, UA: NEGATIVE
NITRITE: NEGATIVE
PROTEIN: 30 mg/dL — AB
Specific Gravity, Urine: 1.027 (ref 1.005–1.030)
pH: 5 (ref 5.0–8.0)

## 2017-08-20 LAB — RAPID URINE DRUG SCREEN, HOSP PERFORMED
Amphetamines: POSITIVE — AB
Barbiturates: NOT DETECTED
Benzodiazepines: NOT DETECTED
Cocaine: POSITIVE — AB
OPIATES: NOT DETECTED
TETRAHYDROCANNABINOL: NOT DETECTED

## 2017-08-20 LAB — COMPREHENSIVE METABOLIC PANEL
ALBUMIN: 4.5 g/dL (ref 3.5–5.0)
ALT: 31 U/L (ref 14–54)
ANION GAP: 18 — AB (ref 5–15)
AST: 111 U/L — ABNORMAL HIGH (ref 15–41)
Alkaline Phosphatase: 82 U/L (ref 38–126)
BUN: 12 mg/dL (ref 6–20)
CHLORIDE: 92 mmol/L — AB (ref 101–111)
CO2: 24 mmol/L (ref 22–32)
Calcium: 9.8 mg/dL (ref 8.9–10.3)
Creatinine, Ser: 1.31 mg/dL — ABNORMAL HIGH (ref 0.44–1.00)
GFR calc Af Amer: 57 mL/min — ABNORMAL LOW (ref >60)
GFR calc non Af Amer: 49 mL/min — ABNORMAL LOW (ref >60)
GLUCOSE: 93 mg/dL (ref 65–99)
POTASSIUM: 3.8 mmol/L (ref 3.5–5.1)
SODIUM: 134 mmol/L — AB (ref 135–145)
Total Bilirubin: 1.3 mg/dL — ABNORMAL HIGH (ref 0.3–1.2)
Total Protein: 7.9 g/dL (ref 6.5–8.1)

## 2017-08-20 LAB — CBC
HEMATOCRIT: 36 % (ref 36.0–46.0)
HEMOGLOBIN: 12.4 g/dL (ref 12.0–15.0)
MCH: 30.5 pg (ref 26.0–34.0)
MCHC: 34.4 g/dL (ref 30.0–36.0)
MCV: 88.7 fL (ref 78.0–100.0)
Platelets: 177 10*3/uL (ref 150–400)
RBC: 4.06 MIL/uL (ref 3.87–5.11)
RDW: 14.8 % (ref 11.5–15.5)
WBC: 3.3 10*3/uL — ABNORMAL LOW (ref 4.0–10.5)

## 2017-08-20 LAB — URINE CULTURE

## 2017-08-20 LAB — I-STAT BETA HCG BLOOD, ED (MC, WL, AP ONLY)

## 2017-08-20 LAB — LIPASE, BLOOD: Lipase: 34 U/L (ref 11–51)

## 2017-08-20 MED ORDER — GI COCKTAIL ~~LOC~~
30.0000 mL | Freq: Once | ORAL | Status: AC
  Administered 2017-08-20: 30 mL via ORAL
  Filled 2017-08-20: qty 30

## 2017-08-20 MED ORDER — SODIUM CHLORIDE 0.9 % IV BOLUS
1000.0000 mL | Freq: Once | INTRAVENOUS | Status: AC
  Administered 2017-08-20: 1000 mL via INTRAVENOUS

## 2017-08-20 MED ORDER — IOPAMIDOL (ISOVUE-300) INJECTION 61%
INTRAVENOUS | Status: AC
  Filled 2017-08-20: qty 100

## 2017-08-20 MED ORDER — PROMETHAZINE HCL 25 MG PO TABS: 25.0000 mg | ORAL_TABLET | Freq: Three times a day (TID) | ORAL | 0 refills | Status: DC | PRN

## 2017-08-20 MED ORDER — FAMOTIDINE IN NACL 20-0.9 MG/50ML-% IV SOLN
20.0000 mg | Freq: Two times a day (BID) | INTRAVENOUS | Status: DC
  Administered 2017-08-20: 20 mg via INTRAVENOUS
  Filled 2017-08-20: qty 50

## 2017-08-20 MED ORDER — IOPAMIDOL (ISOVUE-300) INJECTION 61%
100.0000 mL | Freq: Once | INTRAVENOUS | Status: AC | PRN
  Administered 2017-08-20: 100 mL via INTRAVENOUS

## 2017-08-20 MED ORDER — PROMETHAZINE HCL 25 MG PO TABS
25.0000 mg | ORAL_TABLET | Freq: Once | ORAL | Status: AC
  Administered 2017-08-20: 25 mg via ORAL
  Filled 2017-08-20: qty 1

## 2017-08-20 MED ORDER — ONDANSETRON HCL 4 MG/2ML IJ SOLN
4.0000 mg | Freq: Once | INTRAMUSCULAR | Status: AC
  Administered 2017-08-20: 4 mg via INTRAVENOUS
  Filled 2017-08-20: qty 2

## 2017-08-20 NOTE — ED Provider Notes (Signed)
[facility]-EMERGENCY DEPT Provider Note   CSN: [id number] Arrival date & time: 08/20/17  1353     History   Chief Complaint Chief Complaint  Patient presents with  . Abdominal Pain    HPI Kristin [name] is a 42 y.o. female with history of anxiety, arthritis, depression, hypertension, migraine headaches, and eczema presents for evaluation of acute onset, progressively worsening nausea and vomiting with associated abdominal pain for 3 weeks.  Patient states that she is unable to keep any food or drink down and states "I have not been able to keep anything down since May 18 ".  She states that every time she has anything to eat or drink she will vomit.  She will also vomit sometimes without having had anything to eat recently.  Over the past week she has noted some blood "flecks" in the vomit.  Otherwise emesis is nonbilious.  She also notes constant generalized abdominal pain which is not lying at rest but becomes sharp and stabbing at times.  Pain worsens with vomiting.  She denies fevers, chills, chest pain, shortness of breath, diarrhea, constipation, melena, hematochezia, urinary symptoms, vaginal itching, bleeding, or discharge.  She was seen and evaluated at urgent care 2 days ago who gave her Zofran.  She notes decreased incidence in her vomiting but is still vomiting after meals.  She has tried over-the-counter acid reflux medication, Tums without relief of her symptoms.  She states that she has tried eating a bland diet of foods that will not upset her stomach but this is also not been helpful.  Patient states she will drink alcoholic beverages every weekend, denies recreational drug use or excessive caffeine intake.  The history is provided by the patient.    Past Medical History:  Diagnosis Date  . Anxiety   . Arthritis   . Depression   . Eczema   . Hypertension   . Migraine headache     Patient Active Problem List   Diagnosis Date Noted  . Back pain  08/25/2016  . Generalized anxiety disorder 08/23/2015  . Knee pain 12/21/2014  . Tinea pedis of both feet 10/02/2013  . Menorrhagia 09/04/2012  . Allergic rhinitis 06/12/2012  . Eczema 03/26/2012  . Alcohol abuse 10/26/2010  . Bipolar 1 disorder (HCC) 10/09/2010  . Panic attacks 10/09/2010  . INSOMNIA, CHRONIC 06/29/2009  . TOBACCO USER 01/23/2009  . Obesity 10/29/2008  . Essential hypertension 12/20/2006    Past Surgical History:  Procedure Laterality Date  . DILITATION & CURRETTAGE/HYSTROSCOPY WITH HYDROTHERMAL ABLATION N/A 04/04/2017   Procedure: DILATATION & CURETTAGE/HYSTEROSCOPY WITH HYDROTHERMAL ABLATION;  Surgeon: Reva Bores, MD;  Location: [facility];  Service: Gynecology;  Laterality: N/A;  . TUBAL LIGATION       OB History    Gravida  4   Para  3   Term  3   Preterm      AB  1   Living  3     SAB      TAB  1   Ectopic      Multiple      Live Births               Home Medications    Prior to Admission medications   Medication Sig Start Date End Date Taking? Authorizing Provider  clotrimazole-betamethasone (LOTRISONE) cream Apply 1 application topically 2 (two) times daily. 09/07/16  Yes Rumley, Rock[place] N, DO  diphenhydrAMINE (BENADRYL) 25 MG tablet Take 25 mg  by mouth every 6 (six) hours as needed for allergies.   Yes [provider]  fluticasone (FLONASE) 50 MCG/ACT nasal spray Place 2 sprays into both nostrils daily. Patient taking differently: Place 2 sprays into both nostrils daily as needed for allergies.  02/25/17  Yes Yu, Amy V, PA-C  gabapentin (NEURONTIN) 300 MG capsule TAKE 1 CAPSULE BY MOUTH AT BEDTIME*MAY TAKE UP TO 3 TIMES DAILY IF NO IMPROVEMENT Patient taking differently: TAKE 1 CAPSULE BY MOUTH AT BEDTIME AS NEEDED FOR PAIN (*MAY TAKE UP TO 3 TIMES) 07/04/17  Yes Almon Hercules, MD  lisinopril-hydrochlorothiazide (PRINZIDE,ZESTORETIC) 10-12.5 MG tablet Take 1 tablet by mouth daily.   Yes [provider]  sertraline (ZOLOFT) 50 MG tablet Take 1 tablet (50 mg total) by mouth daily. 06/14/17  Yes Almon Hercules, MD  terbinafine (LAMISIL) 1 % cream Apply 1 application topically 2 (two) times daily. 06/14/17  Yes Almon Hercules, MD  cetirizine (ZYRTEC ALLERGY) 10 MG tablet Take 1 tablet (10 mg total) by mouth daily. Patient taking differently: Take 10 mg by mouth daily as needed for allergies.  02/27/17   Almon Hercules, MD  clonazePAM (KLONOPIN) 0.5 MG tablet Take 1 tablet (0.5 mg total) by mouth 2 (two) times daily as needed for anxiety. Patient not taking: Reported on 08/20/2017 10/30/16   Renne Musca, MD  cyclobenzaprine (FLEXERIL) 10 MG tablet Take 1 tablet (10 mg total) by mouth 2 (two) times daily as needed for muscle spasms. 03/16/17   Khatri, Hina, PA-C  diclofenac (CATAFLAM) 50 MG tablet Take 1 tablet (50 mg total) by mouth 3 (three) times daily. One tablet TID with food prn pain. Patient not taking: Reported on 08/20/2017 09/16/16   Hayden Rasmussen, NP  metroNIDAZOLE (FLAGYL) 500 MG tablet Take 1 tablet (500 mg total) by mouth 2 (two) times daily. Patient not taking: Reported on 05/10/2017 04/29/17   Reva Bores, MD  naproxen (NAPROSYN) 500 MG tablet Take 1 tablet (500 mg total) by mouth 2 (two) times daily. Patient not taking: Reported on 08/20/2017 03/16/17   Dietrich Pates, PA-C  oxyCODONE-acetaminophen (PERCOCET/ROXICET) 5-325 MG tablet Take 1-2 tablets by mouth every 6 (six) hours as needed. Patient not taking: Reported on 05/10/2017 04/04/17   Reva Bores, MD  promethazine (PHENERGAN) 25 MG tablet Take 1 tablet (25 mg total) by mouth every 8 (eight) hours as needed for nausea or vomiting. 08/20/17   Michela Pitcher A, PA-C  QUEtiapine (SEROQUEL) 50 MG tablet Take 1 tablet (50 mg total) by mouth at bedtime for 2 days, THEN 2 tablets (100 mg total) at bedtime for 2 days, THEN 4 tablets (200 mg total) at bedtime. 06/14/17 07/18/17  Almon Hercules, MD  triamcinolone cream (KENALOG) 0.1 % Apply 1  application topically 2 (two) times daily. 05/10/17   Georgetta Haber, NP    Family History Family History  Problem Relation Age of Onset  . Cancer Maternal Grandmother        Breast  . Hypertension Mother   . Hypertension Brother   . Cancer Maternal Uncle        colon    Social History Social History   Tobacco Use  . Smoking status: Former Smoker    Packs/day: 0.25    Years: 5.00    Pack years: 1.25  . Smokeless tobacco: Never Used  Substance Use Topics  . Alcohol use: Yes    Comment: occassionally   . Drug use: No  Allergies   Sumatriptan   Review of Systems Review of Systems  Constitutional: Negative for chills and fever.  Respiratory: Negative for shortness of breath.   Cardiovascular: Negative for chest pain.  Gastrointestinal: Positive for abdominal pain, nausea and vomiting. Negative for blood in stool, constipation and diarrhea.  Genitourinary: Negative for dysuria, frequency, hematuria, vaginal bleeding, vaginal discharge and vaginal pain.  All other systems reviewed and are negative.    Physical Exam Updated Vital Signs BP 106/74 (BP Location: Left Arm)   Pulse 79   Temp 98.3 F (36.8 C) (Oral)   Resp 18   LMP 05/20/2017   SpO2 100%   Physical Exam  Constitutional: She appears well-developed and well-nourished. No distress.  HENT:  Head: Normocephalic and atraumatic.  Eyes: Conjunctivae are normal. Right eye exhibits no discharge. Left eye exhibits no discharge.  Neck: No JVD present. No tracheal deviation present.  Cardiovascular: Normal rate, regular rhythm and normal heart sounds.  Pulmonary/Chest: Effort normal and breath sounds normal.  Abdominal: Soft. She exhibits distension. Bowel sounds are increased. There is generalized tenderness and tenderness in the right upper quadrant and suprapubic area. There is guarding. There is no rigidity, no rebound, no CVA tenderness, no tenderness at McBurney's point and negative Murphy's sign.    Musculoskeletal: She exhibits no edema.  No midline spine TTP, no paraspinal muscle tenderness, no deformity, crepitus, or step-off noted   Neurological: She is alert.  Skin: Skin is warm and dry. No erythema.  Psychiatric: She has a normal mood and affect. Her behavior is normal.  Nursing note and vitals reviewed.    ED Treatments / Results  Labs (all labs ordered are listed, but only abnormal results are displayed) Labs Reviewed  COMPREHENSIVE METABOLIC PANEL - Abnormal; Notable for the following components:      Result Value   Sodium 134 (*)    Chloride 92 (*)    Creatinine, Ser 1.31 (*)    AST 111 (*)    Total Bilirubin 1.3 (*)    GFR calc non Af Amer 49 (*)    GFR calc Af Amer 57 (*)    Anion gap 18 (*)    All other components within normal limits  CBC - Abnormal; Notable for the following components:   WBC 3.3 (*)    All other components within normal limits  URINALYSIS, ROUTINE W REFLEX MICROSCOPIC - Abnormal; Notable for the following components:   Color, Urine AMBER (*)    APPearance HAZY (*)    Bilirubin Urine SMALL (*)    Ketones, ur 20 (*)    Protein, ur 30 (*)    Bacteria, UA RARE (*)    All other components within normal limits  RAPID URINE DRUG SCREEN, HOSP PERFORMED - Abnormal; Notable for the following components:   Cocaine POSITIVE (*)    Amphetamines POSITIVE (*)    All other components within normal limits  LIPASE, BLOOD  I-STAT BETA HCG BLOOD, ED ([facility], [facility], [facility] ONLY)    EKG None  Radiology Ct Abdomen Pelvis W Contrast  Result Date: 08/20/2017 CLINICAL DATA:  Nausea and vomiting.  Abdominal pain. EXAM: CT ABDOMEN AND PELVIS WITH CONTRAST TECHNIQUE: Multidetector CT imaging of the abdomen and pelvis was performed using the standard protocol following bolus administration of intravenous contrast. CONTRAST:  ISOVUE-300 IOPAMIDOL (ISOVUE-300) INJECTION 61% COMPARISON:  None. FINDINGS: Lower chest: 4 mm right middle lobe nodule image 8 series 4.  No pleural effusion or consolidation. Hepatobiliary: Diffusely decreased hepatic density  consistent with steatosis. More focal decreased density adjacent to the gallbladder fossa as well as falciform ligament may be more focal fatty infiltration. Gallbladder physiologically distended, no calcified stone. No biliary dilatation. Pancreas: No ductal dilatation or inflammation. Spleen: Normal in size without focal abnormality. Adrenals/Urinary Tract: Normal adrenal glands. No hydronephrosis or perinephric edema. Homogeneous renal enhancement with symmetric excretion on delayed phase imaging. Urinary bladder is non-distended without wall thickening. Stomach/Bowel: Lack of enteric contrast limits detailed bowel assessment. Stomach is nondistended. No bowel wall thickening, inflammatory change or obstruction. Normal appendix. Moderate colonic stool burden without colonic wall thickening or inflammatory change. Vascular/Lymphatic: No significant vascular findings are present. No enlarged abdominal or pelvic lymph nodes. Reproductive: Uterus and bilateral adnexa are unremarkable. Other: Small fat containing umbilical hernia. Small fat containing ventral upper abdominal hernia. No inflammatory change or bowel involvement. No free air, free fluid, or intra-abdominal fluid collection. Musculoskeletal: There are no acute or suspicious osseous abnormalities. IMPRESSION: 1. No acute findings in the abdomen/pelvis. 2. Diffusely decreased hepatic density consistent with steatosis. More focal decreased density adjacent the gallbladder fossa and adjacent to the falciform ligament likely represent more focal fatty infiltration, however nonspecific. Consider further evaluation with hepatic protocol MRI on a nonemergent basis for further evaluation to exclude underlying mass. 3. Right middle lobe 4 mm pulmonary nodule. No follow-up needed if patient is low-risk. Non-contrast chest CT can be considered in 12 months if patient is  high-risk. This recommendation follows the consensus statement: Guidelines for Management of Incidental Pulmonary Nodules Detected on CT Images: From the Fleischner Society 2017; Radiology 2017; 284:228-243. Electronically Signed   By: Rubye Oaks M.D.   On: 08/20/2017 21:29   US Abdomen Limited Ruq  Result Date: 08/20/2017 CLINICAL DATA:  Nausea and vomiting x3 weeks EXAM: ULTRASOUND ABDOMEN LIMITED RIGHT UPPER QUADRANT COMPARISON:  08/20/2017 CT FINDINGS: Gallbladder: No gallstones or wall thickening visualized. No sonographic Murphy sign noted by sonographer. Common bile duct: Diameter: 4 mm.  No choledocholithiasis. Liver: No focal lesion identified. Increased echogenicity of the liver parenchyma. Portal vein is patent on color Doppler imaging with normal direction of blood flow towards the liver. IMPRESSION: 1. Increased hepatic echogenicity compatible with steatosis. 2. No evidence of acute cholecystitis. Electronically Signed   By: Tollie Eth M.D.   On: 08/20/2017 21:44    Procedures Procedures (including critical care time)  Medications Ordered in ED Medications  sodium chloride 0.9 % bolus 1,000 mL (0 mLs Intravenous Stopped 08/21/17 0002)  gi cocktail (Maalox,Lidocaine,Donnatal) (30 mLs Oral Given 08/20/17 2208)  ondansetron (ZOFRAN) injection 4 mg (4 mg Intravenous Given 08/20/17 2043)  iopamidol (ISOVUE-300) 61 % injection 100 mL (100 mLs Intravenous Contrast Given 08/20/17 2104)  promethazine (PHENERGAN) tablet 25 mg (25 mg Oral Given 08/20/17 2249)     Initial Impression / Assessment and Plan / ED Course  I have reviewed the triage vital signs and the nursing notes.  Pertinent labs & imaging results that were available during my care of the patient were reviewed by me and considered in my medical decision making (see chart for details).     Patient presents with 3-week history of nausea and vomiting with generalized abdominal pain.  She is afebrile, vital signs are stable.   She is nontoxic in appearance.  She has generalized tenderness to palpation of the abdomen with no focal tenderness.  No evidence of acute abdomen.  Lab work reviewed by me shows no leukocytosis, no anemia.  CMP shows mild hyponatremia and hypochloremia consistent  with vomiting.  Elevated anion gap likely secondary to this as well.  She has an elevated creatinine of 1.3 though not consistent with AKI likely elevated secondary to her dehydration.  AST to ALT ratio suggestive of alcohol abuse.  She has an elevated AST and total bilirubin.  Remainder of LFTs are within normal limits.  UA consistent with dehydration but does not suggest UTI or nephrolithiasis.  Patient denies recreational drug use but UDS is positive for cocaine and amphetamines.  She is not pregnant.  Given abnormal LFTs, we will obtain right upper quadrant ultrasound and CT of the abdomen and pelvis.  She will be given nausea medicine and IV fluids in the ED in the meantime.   Imaging is consistent with hepatic steatosis, no evidence of acute cholecystitis. She also has a 4 mm right middle lobe pulmonary nodule, instructed patient to follow-up with her PCP in 1 year for reevaluation.  No evidence of obstruction, perforation, appendicitis, colitis, AAA, TOA, ovarian torsion, PID, or ectopic pregnancy.  I doubt other acute surgical abdominal pathology. Suspect hematemesis likely secondary to Mallory-Weiss tear and not likely to be due to esophageal varies or [place].  Patient did have emesis after being given Zofran but tolerated p.o. Phenergan without difficulty and was able to keep down p.o. food and fluids after this.  Will discharge with a small amount of p.o. Phenergan.  Instructed patient to advance the diet slowly and encouraged fluid hydration.  Also encouraged patient to continue over-the-counter GERD medication such as Zantac.  She will follow-up with her PCP or a gastroenterologist for reevaluation of her symptoms.  Discussed strict ED  return precautions. Pt verbalized understanding of and agreement with plan and is safe for discharge home at this time.  Discussed with Dr. Corlis Leak who agrees with assessment and plan at this time.  Patient has no complaints prior to discharge.   Final Clinical Impressions(s) / ED Diagnoses   Final diagnoses:  Nausea & vomiting  Generalized abdominal pain  Hepatic steatosis    ED Discharge Orders        Ordered    promethazine (PHENERGAN) 25 MG tablet  Every 8 hours PRN     08/20/17 2350       Jeanie Sewer, PA-C 08/22/17 1701    Mackuen, Cindee Salt, MD 08/23/17 2341

## 2017-08-20 NOTE — ED Triage Notes (Signed)
Pt complaint of abdominal pain with n/v; denies diarrhea; onset 3 weeks.

## 2017-08-20 NOTE — ED Notes (Signed)
ED Provider at bedside. 

## 2017-08-20 NOTE — Discharge Instructions (Signed)
1. Medications: You may take [phone] mg of Tylenol every 6 hours as needed for pain. Do not exceed 4000 mg of Tylenol daily.  Take Phenergan as needed for nausea.  Wait around 20 minutes before eating or drinking after taking this medication. 2. Treatment: rest, drink plenty of fluids, advance diet slowly.  Start with water and broth then advance to bland foods that will not upset your stomach such as crackers, mashed potatoes, and peanut butter.  Avoid fried foods, spicy foods, acidic foods, and alcohol. 3. Follow Up: Please followup with your primary doctor or a gastroenterologist in 3 days for discussion of your diagnoses and further evaluation after today's visit; if you do not have a primary care doctor use the resource guide provided to find one; Please return to the ER for persistent vomiting, high fevers or worsening symptoms

## 2017-08-20 NOTE — ED Notes (Signed)
Pt given gingerale and crackers. Will see how pt tolerates.

## 2017-08-20 NOTE — ED Notes (Signed)
Pt returned from CT. [place] at bedside.

## 2017-08-20 NOTE — ED Notes (Signed)
Patient transported to CT 

## 2017-08-21 NOTE — ED Notes (Signed)
Discharge instructions reviewed with pt. Pt verbalized understanding. Pt to follow up with GI. PIV removed. Pt ambulatory to waiting room.

## 2017-09-18 ENCOUNTER — Other Ambulatory Visit: Payer: Self-pay

## 2017-09-18 ENCOUNTER — Observation Stay (HOSPITAL_COMMUNITY)
Admission: EM | Admit: 2017-09-18 | Discharge: 2017-09-21 | Disposition: A | Discharge status: Home / Self Care | Payer: BLUE CROSS/BLUE SHIELD | Attending: Family Medicine | Admitting: Family Medicine

## 2017-09-18 ENCOUNTER — Encounter (HOSPITAL_COMMUNITY): Payer: Self-pay | Admitting: Emergency Medicine

## 2017-09-18 DIAGNOSIS — G629 Polyneuropathy, unspecified: Secondary | ICD-10-CM | POA: Insufficient documentation

## 2017-09-18 DIAGNOSIS — F319 Bipolar disorder, unspecified: Secondary | ICD-10-CM | POA: Diagnosis not present

## 2017-09-18 DIAGNOSIS — F1490 Cocaine use, unspecified, uncomplicated: Secondary | ICD-10-CM | POA: Diagnosis not present

## 2017-09-18 DIAGNOSIS — F411 Generalized anxiety disorder: Secondary | ICD-10-CM | POA: Insufficient documentation

## 2017-09-18 DIAGNOSIS — R1084 Generalized abdominal pain: Secondary | ICD-10-CM | POA: Insufficient documentation

## 2017-09-18 DIAGNOSIS — F17210 Nicotine dependence, cigarettes, uncomplicated: Secondary | ICD-10-CM | POA: Diagnosis not present

## 2017-09-18 DIAGNOSIS — R112 Nausea with vomiting, unspecified: Secondary | ICD-10-CM | POA: Diagnosis not present

## 2017-09-18 DIAGNOSIS — Z888 Allergy status to other drugs, medicaments and biological substances status: Secondary | ICD-10-CM | POA: Insufficient documentation

## 2017-09-18 DIAGNOSIS — K2980 Duodenitis without bleeding: Secondary | ICD-10-CM | POA: Insufficient documentation

## 2017-09-18 DIAGNOSIS — D61818 Other pancytopenia: Secondary | ICD-10-CM | POA: Insufficient documentation

## 2017-09-18 DIAGNOSIS — E878 Other disorders of electrolyte and fluid balance, not elsewhere classified: Secondary | ICD-10-CM | POA: Diagnosis not present

## 2017-09-18 DIAGNOSIS — K2950 Unspecified chronic gastritis without bleeding: Secondary | ICD-10-CM | POA: Insufficient documentation

## 2017-09-18 DIAGNOSIS — E876 Hypokalemia: Secondary | ICD-10-CM | POA: Insufficient documentation

## 2017-09-18 DIAGNOSIS — M1990 Unspecified osteoarthritis, unspecified site: Secondary | ICD-10-CM | POA: Diagnosis not present

## 2017-09-18 DIAGNOSIS — F1010 Alcohol abuse, uncomplicated: Secondary | ICD-10-CM | POA: Diagnosis not present

## 2017-09-18 DIAGNOSIS — R634 Abnormal weight loss: Secondary | ICD-10-CM | POA: Insufficient documentation

## 2017-09-18 DIAGNOSIS — K209 Esophagitis, unspecified without bleeding: Secondary | ICD-10-CM

## 2017-09-18 DIAGNOSIS — N179 Acute kidney failure, unspecified: Secondary | ICD-10-CM | POA: Diagnosis not present

## 2017-09-18 DIAGNOSIS — F419 Anxiety disorder, unspecified: Secondary | ICD-10-CM | POA: Insufficient documentation

## 2017-09-18 DIAGNOSIS — E669 Obesity, unspecified: Secondary | ICD-10-CM | POA: Diagnosis not present

## 2017-09-18 DIAGNOSIS — D649 Anemia, unspecified: Secondary | ICD-10-CM | POA: Diagnosis not present

## 2017-09-18 DIAGNOSIS — B9620 Unspecified Escherichia coli [E. coli] as the cause of diseases classified elsewhere: Secondary | ICD-10-CM | POA: Diagnosis not present

## 2017-09-18 DIAGNOSIS — F410 Panic disorder [episodic paroxysmal anxiety] without agoraphobia: Secondary | ICD-10-CM | POA: Diagnosis not present

## 2017-09-18 DIAGNOSIS — R197 Diarrhea, unspecified: Secondary | ICD-10-CM | POA: Diagnosis present

## 2017-09-18 DIAGNOSIS — Z6831 Body mass index (BMI) 31.0-31.9, adult: Secondary | ICD-10-CM | POA: Insufficient documentation

## 2017-09-18 DIAGNOSIS — F3170 Bipolar disorder, currently in remission, most recent episode unspecified: Secondary | ICD-10-CM

## 2017-09-18 DIAGNOSIS — N390 Urinary tract infection, site not specified: Secondary | ICD-10-CM | POA: Insufficient documentation

## 2017-09-18 DIAGNOSIS — K760 Fatty (change of) liver, not elsewhere classified: Secondary | ICD-10-CM | POA: Insufficient documentation

## 2017-09-18 DIAGNOSIS — I10 Essential (primary) hypertension: Secondary | ICD-10-CM | POA: Diagnosis not present

## 2017-09-18 DIAGNOSIS — D72818 Other decreased white blood cell count: Secondary | ICD-10-CM

## 2017-09-18 DIAGNOSIS — Z79899 Other long term (current) drug therapy: Secondary | ICD-10-CM | POA: Insufficient documentation

## 2017-09-18 DIAGNOSIS — E860 Dehydration: Secondary | ICD-10-CM

## 2017-09-18 LAB — URINALYSIS, ROUTINE W REFLEX MICROSCOPIC
Glucose, UA: NEGATIVE mg/dL
Hgb urine dipstick: NEGATIVE
Ketones, ur: NEGATIVE mg/dL
Nitrite: NEGATIVE
PROTEIN: 30 mg/dL — AB
Specific Gravity, Urine: 1.019 (ref 1.005–1.030)
WBC, UA: >50 WBC/hpf — ABNORMAL HIGH (ref 0–5)
pH: 5 (ref 5.0–8.0)

## 2017-09-18 LAB — COMPREHENSIVE METABOLIC PANEL
ALT: 15 U/L (ref 0–44)
AST: 80 U/L — AB (ref 15–41)
Albumin: 3.9 g/dL (ref 3.5–5.0)
Alkaline Phosphatase: 126 U/L (ref 38–126)
Anion gap: 14 (ref 5–15)
BUN: 7 mg/dL (ref 6–20)
CHLORIDE: 99 mmol/L (ref 98–111)
CO2: 26 mmol/L (ref 22–32)
Calcium: 9.6 mg/dL (ref 8.9–10.3)
Creatinine, Ser: 1.25 mg/dL — ABNORMAL HIGH (ref 0.44–1.00)
GFR, EST NON AFRICAN AMERICAN: 52 mL/min — AB (ref >60)
Glucose, Bld: 102 mg/dL — ABNORMAL HIGH (ref 70–99)
Potassium: 3.4 mmol/L — ABNORMAL LOW (ref 3.5–5.1)
Sodium: 139 mmol/L (ref 135–145)
Total Bilirubin: 1.4 mg/dL — ABNORMAL HIGH (ref 0.3–1.2)
Total Protein: 7.3 g/dL (ref 6.5–8.1)

## 2017-09-18 LAB — I-STAT BETA HCG BLOOD, ED (MC, WL, AP ONLY)

## 2017-09-18 LAB — CBC
HEMATOCRIT: 35 % — AB (ref 36.0–46.0)
Hemoglobin: 11.5 g/dL — ABNORMAL LOW (ref 12.0–15.0)
MCH: 30.1 pg (ref 26.0–34.0)
MCHC: 32.9 g/dL (ref 30.0–36.0)
MCV: 91.6 fL (ref 78.0–100.0)
Platelets: 186 10*3/uL (ref 150–400)
RBC: 3.82 MIL/uL — AB (ref 3.87–5.11)
RDW: 15.3 % (ref 11.5–15.5)
WBC: 3.5 10*3/uL — AB (ref 4.0–10.5)

## 2017-09-18 LAB — MAGNESIUM: Magnesium: 1.6 mg/dL — ABNORMAL LOW (ref 1.7–2.4)

## 2017-09-18 LAB — LIPASE, BLOOD: LIPASE: 32 U/L (ref 11–51)

## 2017-09-18 LAB — PHOSPHORUS: Phosphorus: 3 mg/dL (ref 2.5–4.6)

## 2017-09-18 LAB — FOLATE: FOLATE: 3.6 ng/mL — AB (ref >5.9)

## 2017-09-18 LAB — VITAMIN B12: VITAMIN B 12: 1574 pg/mL — AB (ref 180–914)

## 2017-09-18 LAB — PREALBUMIN: Prealbumin: 20.5 mg/dL (ref 18–38)

## 2017-09-18 MED ORDER — SERTRALINE HCL 50 MG PO TABS
50.0000 mg | ORAL_TABLET | Freq: Every day | ORAL | Status: DC
  Administered 2017-09-18 – 2017-09-21 (×4): 50 mg via ORAL
  Filled 2017-09-18 (×4): qty 1

## 2017-09-18 MED ORDER — SODIUM CHLORIDE 0.9 % IV BOLUS
1000.0000 mL | Freq: Once | INTRAVENOUS | Status: AC
  Administered 2017-09-18: 1000 mL via INTRAVENOUS

## 2017-09-18 MED ORDER — ONDANSETRON HCL 4 MG/2ML IJ SOLN
4.0000 mg | Freq: Once | INTRAMUSCULAR | Status: AC
Start: 2017-09-18 — End: 2017-09-18
  Administered 2017-09-18: 4 mg via INTRAVENOUS
  Filled 2017-09-18: qty 2

## 2017-09-18 MED ORDER — POTASSIUM CHLORIDE CRYS ER 20 MEQ PO TBCR
40.0000 meq | EXTENDED_RELEASE_TABLET | Freq: Once | ORAL | Status: AC
  Administered 2017-09-18: 40 meq via ORAL
  Filled 2017-09-18: qty 2

## 2017-09-18 MED ORDER — DIPHENHYDRAMINE HCL 50 MG/ML IJ SOLN
25.0000 mg | Freq: Once | INTRAMUSCULAR | Status: AC
Start: 2017-09-18 — End: 2017-09-18
  Administered 2017-09-18: 25 mg via INTRAVENOUS
  Filled 2017-09-18: qty 1

## 2017-09-18 MED ORDER — METOCLOPRAMIDE HCL 5 MG/ML IJ SOLN
10.0000 mg | Freq: Once | INTRAMUSCULAR | Status: AC
  Administered 2017-09-18: 10 mg via INTRAVENOUS
  Filled 2017-09-18: qty 2

## 2017-09-18 MED ORDER — SODIUM CHLORIDE 0.9 % IV SOLN
INTRAVENOUS | Status: DC
  Administered 2017-09-18: 23:00:00 via INTRAVENOUS

## 2017-09-18 MED ORDER — FAMOTIDINE IN NACL 20-0.9 MG/50ML-% IV SOLN
20.0000 mg | Freq: Two times a day (BID) | INTRAVENOUS | Status: DC
  Administered 2017-09-18 – 2017-09-19 (×2): 20 mg via INTRAVENOUS
  Filled 2017-09-18 (×2): qty 50

## 2017-09-18 MED ORDER — SODIUM CHLORIDE 0.9 % IV SOLN
1.0000 g | Freq: Once | INTRAVENOUS | Status: AC
  Administered 2017-09-18: 1 g via INTRAVENOUS
  Filled 2017-09-18: qty 10

## 2017-09-18 MED ORDER — ONDANSETRON HCL 4 MG/2ML IJ SOLN
4.0000 mg | Freq: Four times a day (QID) | INTRAMUSCULAR | Status: DC | PRN
  Administered 2017-09-19: 4 mg via INTRAVENOUS
  Filled 2017-09-18: qty 2

## 2017-09-18 MED ORDER — ENOXAPARIN SODIUM 40 MG/0.4ML ~~LOC~~ SOLN
40.0000 mg | SUBCUTANEOUS | Status: DC
  Administered 2017-09-18 – 2017-09-19 (×2): 40 mg via SUBCUTANEOUS
  Filled 2017-09-18 (×3): qty 0.4

## 2017-09-18 MED ORDER — GABAPENTIN 100 MG PO CAPS
100.0000 mg | ORAL_CAPSULE | Freq: Every evening | ORAL | Status: DC | PRN
  Administered 2017-09-20: 100 mg via ORAL
  Filled 2017-09-18: qty 1

## 2017-09-18 MED ORDER — ONDANSETRON HCL 4 MG PO TABS
4.0000 mg | ORAL_TABLET | Freq: Four times a day (QID) | ORAL | Status: DC | PRN
  Administered 2017-09-20 – 2017-09-21 (×3): 4 mg via ORAL
  Filled 2017-09-18 (×3): qty 1

## 2017-09-18 NOTE — Progress Notes (Signed)
Received report from ER RN

## 2017-09-18 NOTE — ED Notes (Signed)
Pt reporting mild nausea. EDP notified.

## 2017-09-18 NOTE — ED Notes (Signed)
Pt stated that she suspects she has a yeast infection and would like to treat it.

## 2017-09-18 NOTE — ED Notes (Signed)
Bladder Scan was unsuccessful. Could not detect bladder.

## 2017-09-18 NOTE — ED Notes (Signed)
ED Provider at bedside. 

## 2017-09-18 NOTE — H&P (Addendum)
Family Medicine Teaching [facility] Admission History and Physical Service Pager: [phone]  Patient name: Kristin [name] Medical record number: [id number] Date of birth: 05-07-1975 Age: 42 y.o. Gender: female  Primary Care Provider: Marthenia Rolling, DO Consultants: GI Code Status: Full code  Chief Complaint: Nausea and vomiting   Assessment and Plan: Kristin [name] is a 42 y.o. female presenting with nausea and vomiting. PMH is significant for HTN, bipolar disorder, anxiety, obesity, tobacco use, alcohol abuse, knee and back pain.  Nausea and vomiting: Ongoing for the past two months, worsening over the last few days.  Vitals are wnl.  Labs significant for slightly reduced potassium and elevated creatinine, although it is stable from one month ago.  Lipase wnl.  Patient has been seen in clinic by GI since mid May and was planning to follow-up with GI for capsule endoscopy in the coming week.  Right upper quadrant ultrasound on 6/10 showed no evidence of acute cholecystitis and possible steatosis; these results were corroborated by CT abdomen on the same day. GI was consulted in the ED and agreed with admission for observation and hydration, and they will see her on 7/10.  She was given 2L NS in the ED. -Admit to inpatient -Attending Dr. Lum Babe -Monitor vitals -Up with assistance -[facility] to see on 7/10; appreciate their recommendations -Continue IV fluids at 150 cc NS per hour - Famotidine IV - EKG to check on QT interval in setting of zofran use, although EKG in July 2018 with normal QT - weigh patient to quantify reported weight loss -Zofran PRN -Monitor mg and phos for refeeding syndrome  Peripheral neuropathy Patient reports a history peripheral neuropathy below the knees due to vertebral issue, no documentation was found in chart.  Decreased sensation of medial thighs and lower abdomen/pelvis is new.  No bladder or bowel incontinence. In the setting of potential malnutrition,  will check B12 and folate. - B12, folate pending  HTN: BP wnl on admission at 105/71, could be lower than baseline due to dehydration.  Takes lisinopril-HCTZ 10-12.5 mg at home. - hold BP meds d/t low-normal BP and AKI -Monitor vitals and restart home meds as needed  AKI: Creatinine on admission 1.25, baseline appears to be ~0.7.  Patient has also been taking NSAIDs for pain relief at home, which could contribute to this. - 1.5 mIVF @ 150 ml/hr - no nephrotoxic medications  Bipolar 1 disorder Per chart review patient takes Seroquel and Zoloft at home.  Patient reports not taking Seroquel at home but does take Zoloft. - Continue Zoloft 50 mg daily - would benefit from taking seroquel for bipolar disorder, will advise patient to resume at discharge  Asymptomatic positive UA (leukocytes) Patient did not endorse any symptoms of UTI during interview.  During work-up in ED urinalysis and microscopy were shown to have significant leukocytes, rare bacteria and white blood cells in clumps.  Patient was given 1 dose of ceftriaxone in ED. -Will not continue to treat asymptomatic bacteriuria  Cigarette smoker Patient reports smoking few cigarettes during this recent illness.  Patient declines nicotine patch  Alcohol use Patient reports consuming several drinks of alcohol on the weekends.  Last drink was 2 days ago.  Patient reports never having experienced withdrawal.  Patient does not expect to experience withdrawal symptoms in hospital.   Hypokalemia K mildly reduced at 3.4 on admission.   - give 40 mEq Kdur if tolerated - am BMP  FEN/GI: General diet Prophylaxis: lovenox  Disposition: MedSurg  History of Present Illness:  Kristin [name] is a 42 y.o. female presenting with nausea, vomiting, and abdominal cramping that has been occurring since 5/19 but has worsened over the past few days.  She reports that she has lost 30 lbs since this began.  She has some flecks of her blood in her vomit.   She says she was referred to [facility] and saw them in the clinic about two weeks ago.  She has a follow-up appointment with GI later this week but was told to go to the emergency room if symptoms worsened.  She is scheduled for a capsule endoscopy next week.  She reports that she has not been able to eat or drink during the past 24 hours.  She endorses general weakness and orthostatic symptoms with lightheadedness and shortness of breath with activity or on standing.  She denies diarrhea/hematuria/melena.  Exacerbating factors include palpation of the area and food intake, while alleviating factors include phenergan and tylenol and ibuprofen.  She says that her thighs and her stomach feel numb, starting about two weeks ago.  She reports decreased sensation of her lower extremities below the knees at baseline but this decreased sensation of her thighs is new.  Review Of Systems: Per HPI with the following additions:   Review of Systems  Constitutional: Positive for chills and fever.  Respiratory: Positive for cough and shortness of breath.   Gastrointestinal: Positive for abdominal pain, constipation, nausea and vomiting. Negative for blood in stool, diarrhea and melena.  Genitourinary: Negative for dysuria and flank pain.  Musculoskeletal: Negative for back pain, joint pain and neck pain.  Neurological: Negative for tremors.    Patient Active Problem List   Diagnosis Date Noted  . Back pain 08/25/2016  . Generalized anxiety disorder 08/23/2015  . Knee pain 12/21/2014  . Tinea pedis of both feet 10/02/2013  . Menorrhagia 09/04/2012  . Allergic rhinitis 06/12/2012  . Eczema 03/26/2012  . Alcohol abuse 10/26/2010  . Bipolar 1 disorder (HCC) 10/09/2010  . Panic attacks 10/09/2010  . INSOMNIA, CHRONIC 06/29/2009  . TOBACCO USER 01/23/2009  . Obesity 10/29/2008  . Essential hypertension 12/20/2006    Past Medical History: Past Medical History:  Diagnosis Date  . Anxiety   . Arthritis    . Depression   . Eczema   . Hypertension   . Migraine headache     Past Surgical History: Past Surgical History:  Procedure Laterality Date  . DILITATION & CURRETTAGE/HYSTROSCOPY WITH HYDROTHERMAL ABLATION N/A 04/04/2017   Procedure: DILATATION & CURETTAGE/HYSTEROSCOPY WITH HYDROTHERMAL ABLATION;  Surgeon: Reva Bores, MD;  Location: [facility];  Service: Gynecology;  Laterality: N/A;  . TUBAL LIGATION      Social History: Social History   Tobacco Use  . Smoking status: Current Every Day Smoker    Packs/day: 0.25    Years: 5.00    Pack years: 1.25  . Smokeless tobacco: Never Used  Substance Use Topics  . Alcohol use: Yes    Comment: occassionally   . Drug use: No   Please also refer to relevant sections of EMR.  Family History: Family History  Problem Relation Age of Onset  . Cancer Maternal Grandmother        Breast  . Hypertension Mother   . Hypertension Brother   . Cancer Maternal Uncle        colon    Allergies and Medications: Allergies  Allergen Reactions  . Sumatriptan Anaphylaxis   No current facility-administered  medications on file prior to encounter.    Current Outpatient Medications on File Prior to Encounter  Medication Sig Dispense Refill  . cetirizine (ZYRTEC ALLERGY) 10 MG tablet Take 1 tablet (10 mg total) by mouth daily. (Patient taking differently: Take 10 mg by mouth daily as needed for allergies. ) 30 tablet 0  . DEXILANT 60 MG capsule Take 60 mg by mouth daily.  2  . fluticasone (FLONASE) 50 MCG/ACT nasal spray Place 2 sprays into both nostrils daily. (Patient taking differently: Place 2 sprays into both nostrils daily as needed for allergies. ) 1 g 0  . gabapentin (NEURONTIN) 300 MG capsule TAKE 1 CAPSULE BY MOUTH AT BEDTIME*MAY TAKE UP TO 3 TIMES DAILY IF NO IMPROVEMENT (Patient taking differently: TAKE 1 CAPSULE BY MOUTH AT BEDTIME AS NEEDED FOR PAIN (*MAY TAKE UP TO 3 TIMES)) 90 capsule 0  .  lisinopril-hydrochlorothiazide (PRINZIDE,ZESTORETIC) 10-12.5 MG tablet Take 1 tablet by mouth daily.    . promethazine (PHENERGAN) 25 MG tablet Take 1 tablet (25 mg total) by mouth every 8 (eight) hours as needed for nausea or vomiting. 20 tablet 0  . sertraline (ZOLOFT) 50 MG tablet Take 1 tablet (50 mg total) by mouth daily. 30 tablet 3  . clonazePAM (KLONOPIN) 0.5 MG tablet Take 1 tablet (0.5 mg total) by mouth 2 (two) times daily as needed for anxiety. (Patient not taking: Reported on 08/20/2017) 60 tablet 0  . clotrimazole-betamethasone (LOTRISONE) cream Apply 1 application topically 2 (two) times daily. (Patient not taking: Reported on 09/18/2017) 30 g 0  . cyclobenzaprine (FLEXERIL) 10 MG tablet Take 1 tablet (10 mg total) by mouth 2 (two) times daily as needed for muscle spasms. (Patient not taking: Reported on 09/18/2017) 20 tablet 0  . diclofenac (CATAFLAM) 50 MG tablet Take 1 tablet (50 mg total) by mouth 3 (three) times daily. One tablet TID with food prn pain. (Patient not taking: Reported on 08/20/2017) 21 tablet 0  . metroNIDAZOLE (FLAGYL) 500 MG tablet Take 1 tablet (500 mg total) by mouth 2 (two) times daily. (Patient not taking: Reported on 05/10/2017) 14 tablet 0  . naproxen (NAPROSYN) 500 MG tablet Take 1 tablet (500 mg total) by mouth 2 (two) times daily. (Patient not taking: Reported on 08/20/2017) 30 tablet 0  . oxyCODONE-acetaminophen (PERCOCET/ROXICET) 5-325 MG tablet Take 1-2 tablets by mouth every 6 (six) hours as needed. (Patient not taking: Reported on 05/10/2017) 8 tablet 0  . QUEtiapine (SEROQUEL) 50 MG tablet Take 1 tablet (50 mg total) by mouth at bedtime for 2 days, THEN 2 tablets (100 mg total) at bedtime for 2 days, THEN 4 tablets (200 mg total) at bedtime. 126 tablet 0  . terbinafine (LAMISIL) 1 % cream Apply 1 application topically 2 (two) times daily. (Patient not taking: Reported on 09/18/2017) 30 g 0  . triamcinolone cream (KENALOG) 0.1 % Apply 1 application topically 2  (two) times daily. (Patient not taking: Reported on 09/18/2017) 30 g 0    Objective: BP (!) 132/92   Pulse 95   Temp 98.4 F (36.9 C) (Oral)   Resp 17   SpO2 100%  Exam: Physical Exam  Constitutional: She appears well-developed and well-nourished. No distress.  HENT:  Head: Normocephalic and atraumatic.  Eyes: Pupils are equal, round, and reactive to light. EOM are normal. No scleral icterus.  Neck: Neck supple.  Cardiovascular: Normal rate, regular rhythm, normal heart sounds and intact distal pulses. Exam reveals no gallop and no friction rub.  No murmur heard.  Pulmonary/Chest: Effort normal and breath sounds normal. No stridor. No respiratory distress. She has no wheezes. She has no rales.  Abdominal: Soft. Bowel sounds are normal. She exhibits no distension and no mass. There is tenderness. There is no guarding.  Abdomen diffusely tender to palpation.  More significant in the epigastric region.  Musculoskeletal: She exhibits no edema or deformity.  Lymphadenopathy:    She has no cervical adenopathy.  Neurological: A sensory deficit is present. No cranial nerve deficit.  Decreased sensation along medial thighs bilaterally and lower abdomen/pelvis.  Skin: Skin is warm and dry. She is not diaphoretic. No erythema.  Psychiatric: She has a normal mood and affect. Her behavior is normal. Judgment and thought content normal.     Labs and Imaging: CBC BMET  Recent Labs  Lab 09/18/17 1259  WBC 3.5*  HGB 11.5*  HCT 35.0*  PLT 186   Recent Labs  Lab 09/18/17 1259  NA 139  K 3.4*  CL 99  CO2 26  BUN 7  CREATININE 1.25*  GLUCOSE 102*  CALCIUM 9.6     Ct Abdomen Pelvis W Contrast  Result Date: 08/20/2017 CLINICAL DATA:  Nausea and vomiting.  Abdominal pain. EXAM: CT ABDOMEN AND PELVIS WITH CONTRAST TECHNIQUE: Multidetector CT imaging of the abdomen and pelvis was performed using the standard protocol following bolus administration of intravenous contrast. CONTRAST:   ISOVUE-300 IOPAMIDOL (ISOVUE-300) INJECTION 61% COMPARISON:  None. FINDINGS: Lower chest: 4 mm right middle lobe nodule image 8 series 4. No pleural effusion or consolidation. Hepatobiliary: Diffusely decreased hepatic density consistent with steatosis. More focal decreased density adjacent to the gallbladder fossa as well as falciform ligament may be more focal fatty infiltration. Gallbladder physiologically distended, no calcified stone. No biliary dilatation. Pancreas: No ductal dilatation or inflammation. Spleen: Normal in size without focal abnormality. Adrenals/Urinary Tract: Normal adrenal glands. No hydronephrosis or perinephric edema. Homogeneous renal enhancement with symmetric excretion on delayed phase imaging. Urinary bladder is non-distended without wall thickening. Stomach/Bowel: Lack of enteric contrast limits detailed bowel assessment. Stomach is nondistended. No bowel wall thickening, inflammatory change or obstruction. Normal appendix. Moderate colonic stool burden without colonic wall thickening or inflammatory change. Vascular/Lymphatic: No significant vascular findings are present. No enlarged abdominal or pelvic lymph nodes. Reproductive: Uterus and bilateral adnexa are unremarkable. Other: Small fat containing umbilical hernia. Small fat containing ventral upper abdominal hernia. No inflammatory change or bowel involvement. No free air, free fluid, or intra-abdominal fluid collection. Musculoskeletal: There are no acute or suspicious osseous abnormalities. IMPRESSION: 1. No acute findings in the abdomen/pelvis. 2. Diffusely decreased hepatic density consistent with steatosis. More focal decreased density adjacent the gallbladder fossa and adjacent to the falciform ligament likely represent more focal fatty infiltration, however nonspecific. Consider further evaluation with hepatic protocol MRI on a nonemergent basis for further evaluation to exclude underlying mass. 3. Right middle lobe 4 mm  pulmonary nodule. No follow-up needed if patient is low-risk. Non-contrast chest CT can be considered in 12 months if patient is high-risk. This recommendation follows the consensus statement: Guidelines for Management of Incidental Pulmonary Nodules Detected on CT Images: From the Fleischner Society 2017; Radiology 2017; 284:228-243. Electronically Signed   By: Rubye Oaks M.D.   On: 08/20/2017 21:29   US Abdomen Limited Ruq  Result Date: 08/20/2017 CLINICAL DATA:  Nausea and vomiting x3 weeks EXAM: ULTRASOUND ABDOMEN LIMITED RIGHT UPPER QUADRANT COMPARISON:  08/20/2017 CT FINDINGS: Gallbladder: No gallstones or wall thickening visualized. No sonographic Murphy sign noted by sonographer.  Common bile duct: Diameter: 4 mm.  No choledocholithiasis. Liver: No focal lesion identified. Increased echogenicity of the liver parenchyma. Portal vein is patent on color Doppler imaging with normal direction of blood flow towards the liver. IMPRESSION: 1. Increased hepatic echogenicity compatible with steatosis. 2. No evidence of acute cholecystitis. Electronically Signed   By: Tollie Eth M.D.   On: 08/20/2017 21:44     Mirian Mo, MD 09/18/2017, 8:44 PM PGY-1, [facility] FPTS Intern pager: [phone], text pages welcome  FPTS Upper-Level Resident Addendum   I have independently interviewed and examined the patient. I have discussed the above with the original author and agree with their documentation. My edits for correction/addition/clarification are in blue. Please see also any attending notes.    Lennox Solders, MD PGY-2, [facility] 09/18/2017 11:46 PM  FPTS Service pager: [phone] (text pages welcome through [facility])

## 2017-09-18 NOTE — ED Notes (Signed)
Went to pt's room to gather her things and take her upstairs to her new bed. Pt's admitting provider was at bedside talking to pt. This tech will take pt upstairs when they have finished with her.

## 2017-09-18 NOTE — ED Provider Notes (Addendum)
[facility] EMERGENCY DEPARTMENT Provider Note   CSN: [id number] Arrival date & time: 09/18/17  1228     History   Chief Complaint Chief Complaint  Patient presents with  . Emesis  . Dizziness    HPI Kristin [name] is a 42 y.o. female.  HPI 42 year old female with past medical history as below here with ongoing nausea, vomiting, and diffuse abdominal cramping.  Patient has had an extensive history of recurrent nausea and vomiting for the last 2 months.  She has lost 30 pounds unintentionally.  She has been seen by her PCP and is currently being referred to GI.  She saw a equal GI in the clinic and is scheduled for a capsule endoscopy within the next week.  She states that over the last several days, her nausea and vomiting is worsened.  She been unable to eat or drink for the last 24 hours.  She said associated lightheadedness upon standing and generalized weakness.  She has not had any diarrhea.  Her pain is diffuse, cramp-like, and worsens with palpation and eating.  No alleviating factors.   Past Medical History:  Diagnosis Date  . Anxiety   . Arthritis   . Depression   . Eczema   . Hypertension   . Migraine headache     Patient Active Problem List   Diagnosis Date Noted  . Nausea & vomiting 09/18/2017  . AKI (acute kidney injury) (HCC)   . Bipolar disorder in remission (HCC)   . Back pain 08/25/2016  . Generalized anxiety disorder 08/23/2015  . Knee pain 12/21/2014  . Tinea pedis of both feet 10/02/2013  . Menorrhagia 09/04/2012  . Allergic rhinitis 06/12/2012  . Eczema 03/26/2012  . Alcohol abuse 10/26/2010  . Bipolar 1 disorder (HCC) 10/09/2010  . Panic attacks 10/09/2010  . INSOMNIA, CHRONIC 06/29/2009  . TOBACCO USER 01/23/2009  . Obesity 10/29/2008  . Essential hypertension 12/20/2006    Past Surgical History:  Procedure Laterality Date  . DILITATION & CURRETTAGE/HYSTROSCOPY WITH HYDROTHERMAL ABLATION N/A 04/04/2017   Procedure:  DILATATION & CURETTAGE/HYSTEROSCOPY WITH HYDROTHERMAL ABLATION;  Surgeon: Reva Bores, MD;  Location: [facility];  Service: Gynecology;  Laterality: N/A;  . TUBAL LIGATION       OB History    Gravida  4   Para  3   Term  3   Preterm      AB  1   Living  3     SAB      TAB  1   Ectopic      Multiple      Live Births               Home Medications    Prior to Admission medications   Medication Sig Start Date End Date Taking? Authorizing Provider  cetirizine (ZYRTEC ALLERGY) 10 MG tablet Take 1 tablet (10 mg total) by mouth daily. Patient taking differently: Take 10 mg by mouth daily as needed for allergies.  02/27/17  Yes Gonfa, Boyce Medici, MD  DEXILANT 60 MG capsule Take 60 mg by mouth daily. 09/18/17  Yes [provider]  fluticasone (FLONASE) 50 MCG/ACT nasal spray Place 2 sprays into both nostrils daily. Patient taking differently: Place 2 sprays into both nostrils daily as needed for allergies.  02/25/17  Yes Yu, Amy V, PA-C  gabapentin (NEURONTIN) 300 MG capsule TAKE 1 CAPSULE BY MOUTH AT BEDTIME*MAY TAKE UP TO 3 TIMES DAILY IF NO IMPROVEMENT Patient taking  differently: TAKE 1 CAPSULE BY MOUTH AT BEDTIME AS NEEDED FOR PAIN (*MAY TAKE UP TO 3 TIMES) 07/04/17  Yes Almon Hercules, MD  lisinopril-hydrochlorothiazide (PRINZIDE,ZESTORETIC) 10-12.5 MG tablet Take 1 tablet by mouth daily.   Yes [provider]  promethazine (PHENERGAN) 25 MG tablet Take 1 tablet (25 mg total) by mouth every 8 (eight) hours as needed for nausea or vomiting. 08/20/17  Yes Fawze, Mina A, PA-C  sertraline (ZOLOFT) 50 MG tablet Take 1 tablet (50 mg total) by mouth daily. 06/14/17  Yes Almon Hercules, MD  clonazePAM (KLONOPIN) 0.5 MG tablet Take 1 tablet (0.5 mg total) by mouth 2 (two) times daily as needed for anxiety. Patient not taking: Reported on 08/20/2017 10/30/16   Renne Musca, MD  clotrimazole-betamethasone (LOTRISONE) cream Apply 1 application topically 2  (two) times daily. Patient not taking: Reported on 09/18/2017 09/07/16   Araceli Bouche, DO  cyclobenzaprine (FLEXERIL) 10 MG tablet Take 1 tablet (10 mg total) by mouth 2 (two) times daily as needed for muscle spasms. Patient not taking: Reported on 09/18/2017 03/16/17   Dietrich Pates, PA-C  diclofenac (CATAFLAM) 50 MG tablet Take 1 tablet (50 mg total) by mouth 3 (three) times daily. One tablet TID with food prn pain. Patient not taking: Reported on 08/20/2017 09/16/16   Hayden Rasmussen, NP  metroNIDAZOLE (FLAGYL) 500 MG tablet Take 1 tablet (500 mg total) by mouth 2 (two) times daily. Patient not taking: Reported on 05/10/2017 04/29/17   Reva Bores, MD  naproxen (NAPROSYN) 500 MG tablet Take 1 tablet (500 mg total) by mouth 2 (two) times daily. Patient not taking: Reported on 08/20/2017 03/16/17   Dietrich Pates, PA-C  oxyCODONE-acetaminophen (PERCOCET/ROXICET) 5-325 MG tablet Take 1-2 tablets by mouth every 6 (six) hours as needed. Patient not taking: Reported on 05/10/2017 04/04/17   Reva Bores, MD  QUEtiapine (SEROQUEL) 50 MG tablet Take 1 tablet (50 mg total) by mouth at bedtime for 2 days, THEN 2 tablets (100 mg total) at bedtime for 2 days, THEN 4 tablets (200 mg total) at bedtime. 06/14/17 07/18/17  Almon Hercules, MD  terbinafine (LAMISIL) 1 % cream Apply 1 application topically 2 (two) times daily. Patient not taking: Reported on 09/18/2017 06/14/17   Almon Hercules, MD  triamcinolone cream (KENALOG) 0.1 % Apply 1 application topically 2 (two) times daily. Patient not taking: Reported on 09/18/2017 05/10/17   Georgetta Haber, NP    Family History Family History  Problem Relation Age of Onset  . Cancer Maternal Grandmother        Breast  . Hypertension Mother   . Hypertension Brother   . Cancer Maternal Uncle        colon    Social History Social History   Tobacco Use  . Smoking status: Current Every Day Smoker    Packs/day: 0.25    Years: 5.00    Pack years: 1.25  . Smokeless tobacco: Never  Used  Substance Use Topics  . Alcohol use: Yes    Comment: occassionally   . Drug use: No     Allergies   Sumatriptan   Review of Systems Review of Systems  Constitutional: Positive for fatigue.  Gastrointestinal: Positive for abdominal pain, nausea and vomiting.  Neurological: Positive for light-headedness.  All other systems reviewed and are negative.    Physical Exam Updated Vital Signs BP 125/83 (BP Location: Left Arm)   Pulse 81   Temp 98.4 F (36.9 C) (Oral)  Resp 18   Ht 5\' 4"  (1.626 m)   Wt 80.9 kg (178 lb 5.6 oz)   SpO2 100%   BMI 30.61 kg/m   Physical Exam  Constitutional: She is oriented to person, place, and time. She appears well-developed and well-nourished. No distress.  HENT:  Head: Normocephalic and atraumatic.  Dry MM  Eyes: Conjunctivae are normal.  Neck: Neck supple.  Cardiovascular: Normal rate, regular rhythm and normal heart sounds. Exam reveals no friction rub.  No murmur heard. Pulmonary/Chest: Effort normal and breath sounds normal. No respiratory distress. She has no wheezes. She has no rales.  Abdominal: Normal appearance. She exhibits no distension. There is generalized tenderness.  Musculoskeletal: She exhibits no edema.  Neurological: She is alert and oriented to person, place, and time. She exhibits normal muscle tone.  Skin: Skin is warm. Capillary refill takes less than 2 seconds.  Psychiatric: She has a normal mood and affect.  Nursing note and vitals reviewed.    ED Treatments / Results  Labs (all labs ordered are listed, but only abnormal results are displayed) Labs Reviewed  COMPREHENSIVE METABOLIC PANEL - Abnormal; Notable for the following components:      Result Value   Potassium 3.4 (*)    Glucose, Bld 102 (*)    Creatinine, Ser 1.25 (*)    AST 80 (*)    Total Bilirubin 1.4 (*)    GFR calc non Af Amer 52 (*)    All other components within normal limits  CBC - Abnormal; Notable for the following components:    WBC 3.5 (*)    RBC 3.82 (*)    Hemoglobin 11.5 (*)    HCT 35.0 (*)    All other components within normal limits  URINALYSIS, ROUTINE W REFLEX MICROSCOPIC - Abnormal; Notable for the following components:   Color, Urine AMBER (*)    APPearance TURBID (*)    Bilirubin Urine SMALL (*)    Protein, ur 30 (*)    Leukocytes, UA LARGE (*)    WBC, UA >50 (*)    Bacteria, UA RARE (*)    Non Squamous Epithelial 0-5 (*)    All other components within normal limits  MAGNESIUM - Abnormal; Notable for the following components:   Magnesium 1.6 (*)    All other components within normal limits  VITAMIN B12 - Abnormal; Notable for the following components:   Vitamin B-12 1,574 (*)    All other components within normal limits  FOLATE - Abnormal; Notable for the following components:   Folate 3.6 (*)    All other components within normal limits  URINE CULTURE  LIPASE, BLOOD  PHOSPHORUS  PREALBUMIN  HIV ANTIBODY (ROUTINE TESTING)  CBC  BASIC METABOLIC PANEL  I-STAT BETA HCG BLOOD, ED ([facility], [facility], [facility] ONLY)    EKG None  Radiology No results found.  Procedures .Critical Care Performed by: Shaune Pollack, MD Authorized by: Shaune Pollack, MD   Critical care provider statement:    Critical care time (minutes):  35   Critical care time was exclusive of:  Separately billable procedures and treating other patients and teaching time   Critical care was necessary to treat or prevent imminent or life-threatening deterioration of the following conditions:  Cardiac failure, circulatory failure, respiratory failure and sepsis   Critical care was time spent personally by me on the following activities:  Development of treatment plan with patient or surrogate, discussions with consultants, evaluation of patient's response to treatment, examination of patient, obtaining history  from patient or surrogate, ordering and performing treatments and interventions, ordering and review of laboratory studies, ordering  and review of radiographic studies, pulse oximetry, re-evaluation of patient's condition and review of old charts   I assumed direction of critical care for this patient from another provider in my specialty: no     (including critical care time)  Medications Ordered in ED Medications  gabapentin (NEURONTIN) capsule 100 mg (has no administration in time range)  sertraline (ZOLOFT) tablet 50 mg (50 mg Oral Given 09/18/17 2322)  enoxaparin (LOVENOX) injection 40 mg (40 mg Subcutaneous Given 09/18/17 2318)  0.9 %  sodium chloride infusion ( Intravenous New Bag/Given 09/18/17 2318)  ondansetron (ZOFRAN) tablet 4 mg (has no administration in time range)    Or  ondansetron (ZOFRAN) injection 4 mg (has no administration in time range)  famotidine (PEPCID) IVPB 20 mg premix (20 mg Intravenous New Bag/Given 09/18/17 2323)  sodium chloride 0.9 % bolus 1,000 mL (0 mLs Intravenous Stopped 09/18/17 1857)  sodium chloride 0.9 % bolus 1,000 mL (0 mLs Intravenous Stopped 09/18/17 1857)  diphenhydrAMINE (BENADRYL) injection 25 mg (25 mg Intravenous Given 09/18/17 1708)  metoCLOPramide (REGLAN) injection 10 mg (10 mg Intravenous Given 09/18/17 1708)  cefTRIAXone (ROCEPHIN) 1 g in sodium chloride 0.9 % 100 mL IVPB (0 g Intravenous Stopped 09/18/17 1926)  ondansetron (ZOFRAN) injection 4 mg (4 mg Intravenous Given 09/18/17 2319)  potassium chloride SA (K-DUR,KLOR-CON) CR tablet 40 mEq (40 mEq Oral Given 09/18/17 2319)     Initial Impression / Assessment and Plan / ED Course  I have reviewed the triage vital signs and the nursing notes.  Pertinent labs & imaging results that were available during my care of the patient were reviewed by me and considered in my medical decision making (see chart for details).     43 year old female here with intractable nausea and vomiting.  This occurs in the setting of significant weight loss due to the same over the last 1 to 2 months.  On arrival here, the patient does appear dehydrated.   Lab work is overall reassuring with baseline renal function. Mild hypokalemia is likely 2/2 her vomiting, dehydration. She does have significant pyuria, large leukocytes, and rare bacteria concerning for concomitant UTI.  Will give IV Rocephin, fluids, and symptomatic control.  Discussed with Dr. Levora Angel of equal GI, who agrees with plan for admission and will see the patient in the morning.  Final Clinical Impressions(s) / ED Diagnoses   Final diagnoses:  Dehydration  Non-intractable vomiting with nausea, unspecified vomiting type  Other decreased white blood cell (WBC) count    ED Discharge Orders    None       Shaune Pollack, MD 09/19/17 Buddy Duty    Shaune Pollack, MD 10/01/17 [phone]

## 2017-09-18 NOTE — ED Triage Notes (Addendum)
vomting since may 18 , saw gi dr  Vista Lawman to have procedure on Friday, has gotten worse, has had ginger ale but states she vomited states she has been dizzy

## 2017-09-18 NOTE — ED Notes (Signed)
Pt ambulated back to room with a steady gait.  

## 2017-09-19 ENCOUNTER — Encounter (HOSPITAL_COMMUNITY)
Admission: EM | Disposition: A | Discharge status: Home / Self Care | Payer: Self-pay | Source: Home / Self Care | Attending: Emergency Medicine

## 2017-09-19 ENCOUNTER — Other Ambulatory Visit: Payer: Self-pay

## 2017-09-19 ENCOUNTER — Encounter (HOSPITAL_COMMUNITY): Payer: Self-pay | Admitting: Gastroenterology

## 2017-09-19 ENCOUNTER — Observation Stay (HOSPITAL_COMMUNITY): Payer: BLUE CROSS/BLUE SHIELD | Admitting: Anesthesiology

## 2017-09-19 DIAGNOSIS — E860 Dehydration: Secondary | ICD-10-CM

## 2017-09-19 DIAGNOSIS — K2950 Unspecified chronic gastritis without bleeding: Secondary | ICD-10-CM | POA: Diagnosis not present

## 2017-09-19 DIAGNOSIS — N390 Urinary tract infection, site not specified: Secondary | ICD-10-CM | POA: Diagnosis not present

## 2017-09-19 DIAGNOSIS — K2980 Duodenitis without bleeding: Secondary | ICD-10-CM | POA: Diagnosis not present

## 2017-09-19 DIAGNOSIS — R112 Nausea with vomiting, unspecified: Secondary | ICD-10-CM | POA: Diagnosis not present

## 2017-09-19 DIAGNOSIS — F3170 Bipolar disorder, currently in remission, most recent episode unspecified: Secondary | ICD-10-CM | POA: Diagnosis not present

## 2017-09-19 HISTORY — PX: ESOPHAGOGASTRODUODENOSCOPY (EGD) WITH PROPOFOL: SHX5813

## 2017-09-19 HISTORY — PX: BIOPSY: SHX5522

## 2017-09-19 LAB — RAPID URINE DRUG SCREEN, HOSP PERFORMED
AMPHETAMINES: NOT DETECTED
Benzodiazepines: NOT DETECTED
Cocaine: POSITIVE — AB
OPIATES: NOT DETECTED
Tetrahydrocannabinol: NOT DETECTED

## 2017-09-19 LAB — BASIC METABOLIC PANEL
Anion gap: 12 (ref 5–15)
BUN: 6 mg/dL (ref 6–20)
CALCIUM: 8.4 mg/dL — AB (ref 8.9–10.3)
CHLORIDE: 103 mmol/L (ref 98–111)
CO2: 24 mmol/L (ref 22–32)
Creatinine, Ser: 1.06 mg/dL — ABNORMAL HIGH (ref 0.44–1.00)
GFR calc Af Amer: >60 mL/min (ref >60)
GFR calc non Af Amer: >60 mL/min (ref >60)
GLUCOSE: 94 mg/dL (ref 70–99)
Potassium: 3.1 mmol/L — ABNORMAL LOW (ref 3.5–5.1)
SODIUM: 139 mmol/L (ref 135–145)

## 2017-09-19 LAB — LACTIC ACID, PLASMA: Lactic Acid, Venous: 1 mmol/L (ref 0.5–1.9)

## 2017-09-19 LAB — CBC
HCT: 29 % — ABNORMAL LOW (ref 36.0–46.0)
Hemoglobin: 9.7 g/dL — ABNORMAL LOW (ref 12.0–15.0)
MCH: 30.5 pg (ref 26.0–34.0)
MCHC: 33.4 g/dL (ref 30.0–36.0)
MCV: 91.2 fL (ref 78.0–100.0)
PLATELETS: 152 10*3/uL (ref 150–400)
RBC: 3.18 MIL/uL — ABNORMAL LOW (ref 3.87–5.11)
RDW: 15.2 % (ref 11.5–15.5)
WBC: 3.4 10*3/uL — ABNORMAL LOW (ref 4.0–10.5)

## 2017-09-19 LAB — HIV ANTIBODY (ROUTINE TESTING W REFLEX): HIV Screen 4th Generation wRfx: NONREACTIVE

## 2017-09-19 SURGERY — ESOPHAGOGASTRODUODENOSCOPY (EGD) WITH PROPOFOL
Anesthesia: Monitor Anesthesia Care

## 2017-09-19 MED ORDER — SODIUM CHLORIDE 0.9 % IV SOLN
INTRAVENOUS | Status: DC | PRN
  Administered 2017-09-19: 13:00:00 via INTRAVENOUS

## 2017-09-19 MED ORDER — PROPOFOL 10 MG/ML IV BOLUS
INTRAVENOUS | Status: DC | PRN
  Administered 2017-09-19: 70 mg via INTRAVENOUS
  Administered 2017-09-19: 50 mg via INTRAVENOUS
  Administered 2017-09-19: 40 mg via INTRAVENOUS
  Administered 2017-09-19: 100 mg via INTRAVENOUS

## 2017-09-19 MED ORDER — PANTOPRAZOLE SODIUM 40 MG PO TBEC
40.0000 mg | DELAYED_RELEASE_TABLET | Freq: Two times a day (BID) | ORAL | Status: DC
  Administered 2017-09-19 – 2017-09-21 (×5): 40 mg via ORAL
  Filled 2017-09-19 (×5): qty 1

## 2017-09-19 MED ORDER — FOLIC ACID 1 MG PO TABS
1.0000 mg | ORAL_TABLET | Freq: Every day | ORAL | Status: DC
  Administered 2017-09-19 – 2017-09-21 (×3): 1 mg via ORAL
  Filled 2017-09-19 (×3): qty 1

## 2017-09-19 MED ORDER — SODIUM CHLORIDE 0.9 % IV SOLN: INTRAVENOUS | Status: DC

## 2017-09-19 MED ORDER — LISINOPRIL-HYDROCHLOROTHIAZIDE 10-12.5 MG PO TABS: 1.0000 | ORAL_TABLET | Freq: Every day | ORAL | 3 refills | Status: DC

## 2017-09-19 MED ORDER — POTASSIUM CHLORIDE CRYS ER 20 MEQ PO TBCR
40.0000 meq | EXTENDED_RELEASE_TABLET | ORAL | Status: AC
  Administered 2017-09-19 (×2): 40 meq via ORAL
  Filled 2017-09-19 (×2): qty 2

## 2017-09-19 MED ORDER — FLUCONAZOLE 150 MG PO TABS
150.0000 mg | ORAL_TABLET | Freq: Once | ORAL | Status: AC
  Administered 2017-09-19: 150 mg via ORAL
  Filled 2017-09-19: qty 1

## 2017-09-19 MED ORDER — DICYCLOMINE HCL 20 MG PO TABS
20.0000 mg | ORAL_TABLET | Freq: Three times a day (TID) | ORAL | Status: DC
  Administered 2017-09-19 – 2017-09-21 (×6): 20 mg via ORAL
  Filled 2017-09-19 (×6): qty 1

## 2017-09-19 MED ORDER — LACTATED RINGERS IV SOLN: INTRAVENOUS | Status: DC | PRN

## 2017-09-19 MED ORDER — FAMOTIDINE 20 MG PO TABS
20.0000 mg | ORAL_TABLET | Freq: Two times a day (BID) | ORAL | Status: DC
  Administered 2017-09-19 – 2017-09-21 (×4): 20 mg via ORAL
  Filled 2017-09-19 (×4): qty 1

## 2017-09-19 MED ORDER — SUCRALFATE 1 GM/10ML PO SUSP
1.0000 g | Freq: Three times a day (TID) | ORAL | Status: DC
  Administered 2017-09-19 – 2017-09-21 (×7): 1 g via ORAL
  Filled 2017-09-19 (×7): qty 10

## 2017-09-19 MED ORDER — PROPOFOL 500 MG/50ML IV EMUL
INTRAVENOUS | Status: DC | PRN
  Administered 2017-09-19: 100 ug/kg/min via INTRAVENOUS

## 2017-09-19 SURGICAL SUPPLY — 14 items

## 2017-09-19 NOTE — Progress Notes (Signed)
Patient's IV came out and she is now refusing a new IV. MD made aware. Will continue to monitor.   Lillia Pauls RN

## 2017-09-19 NOTE — Brief Op Note (Signed)
09/18/2017 - 09/19/2017  1:47 PM  PATIENT:  Kristin [name]  42 y.o. female  PRE-OPERATIVE DIAGNOSIS:  Nausea, vomiting, weight loss  POST-OPERATIVE DIAGNOSIS:  Esophagitis, biopsies taken for Hpylori and Celiac disease  PROCEDURE:  Procedure(s): ESOPHAGOGASTRODUODENOSCOPY (EGD) WITH PROPOFOL (N/A) BIOPSY  SURGEON:  Surgeon(s) and Role:    * Miyah Hampshire, MD - Primary  Findings ----------- - EGD showed LA grade C esophagitis and Mild Gastritis. biopsies taken   Recommendations ----------------------- - Start BID PPI and Carafate - full liquid diet - recommend repeat EGD in 8 weeks to document healing. - GI will follow  Kathi Der MD, FACP 09/19/2017, 1:50 PM  Contact #  [phone]

## 2017-09-19 NOTE — Transfer of Care (Signed)
Immediate Anesthesia Transfer of Care Note  [name]  Procedure(s) Performed: ESOPHAGOGASTRODUODENOSCOPY (EGD) WITH PROPOFOL (N/A )  Patient Location: PACU  Anesthesia Type:MAC  Level of Consciousness: awake, alert  and oriented  Airway & Oxygen Therapy: Patient Spontanous Breathing and Patient connected to nasal cannula oxygen  Post-op Assessment: Report given to RN, Post -op Vital signs reviewed and stable and Patient moving all extremities X 4  Post vital signs: Reviewed and stable  Last Vitals:  Vitals Value Taken Time  BP    Temp    Pulse    Resp    SpO2      Last Pain:  Vitals:   09/19/17 1202  TempSrc: Oral  PainSc: 8       Patients Stated Pain Goal: 4 (94/80/16 5537)  Complications: No apparent anesthesia complications

## 2017-09-19 NOTE — Consult Note (Signed)
Reason for Consult:nausea vomiting weight loss Referring Physician: Hospital team  Kristin [name] is an 42 y.o. female.  HPI: patient seen and examined and her hospital computer chart reviewed and our office computer chart reviewed and she just seen our partner Dr. Michail Sermon in the office and was set up for an outpatient EGD on Friday and was admitted for her persistent symptoms of nausea vomiting abdominal pain and weight loss and we are consulted for further workup and plans and she's had this for about 6 months without much lower bowel complaints adn lab parameters and x-rays so far have been negative and she does have a positive tox screen but denies any dysphagia and is on multiple medicines including narcotics which might be playing a role and she has no other complaints  Past Medical History:  Diagnosis Date  . Anxiety   . Arthritis   . Depression   . Eczema   . Hypertension   . Migraine headache     Past Surgical History:  Procedure Laterality Date  . DILITATION & CURRETTAGE/HYSTROSCOPY WITH HYDROTHERMAL ABLATION N/A 04/04/2017   Procedure: DILATATION & CURETTAGE/HYSTEROSCOPY WITH HYDROTHERMAL ABLATION;  Surgeon: Donnamae Jude, MD;  Location: [place];  Service: Gynecology;  Laterality: N/A;  . TUBAL LIGATION      Family History  Problem Relation Age of Onset  . Cancer Maternal Grandmother        Breast  . Hypertension Mother   . Hypertension Brother   . Cancer Maternal Uncle        colon    Social History:  reports that she has been smoking.  She has a 1.25 pack-year smoking history. She has never used smokeless tobacco. She reports that she drinks alcohol. She reports that she does not use drugs.  Allergies:  Allergies  Allergen Reactions  . Sumatriptan Anaphylaxis    Medications: I have reviewed the patient's current medications.  Results for orders placed or performed during the hospital encounter of 09/18/17 (from the past 48 hour(s))  Lipase,  blood     Status: None   Collection Time: 09/18/17 12:59 PM  Result Value Ref Range   Lipase 32 11 - 51 U/L    Comment: Performed at [facility], [place] [address], [place], [place] [address]  Comprehensive metabolic panel     Status: Abnormal   Collection Time: 09/18/17 12:59 PM  Result Value Ref Range   Sodium 139 135 - 145 mmol/L   Potassium 3.4 (L) 3.5 - 5.1 mmol/L   Chloride 99 98 - 111 mmol/L    Comment: Please note change in reference range.   CO2 26 22 - 32 mmol/L   Glucose, Bld 102 (H) 70 - 99 mg/dL    Comment: Please note change in reference range.   BUN 7 6 - 20 mg/dL    Comment: Please note change in reference range.   Creatinine, Ser 1.25 (H) 0.44 - 1.00 mg/dL   Calcium 9.6 8.9 - 10.3 mg/dL   Total Protein 7.3 6.5 - 8.1 g/dL   Albumin 3.9 3.5 - 5.0 g/dL   AST 80 (H) 15 - 41 U/L   ALT 15 0 - 44 U/L    Comment: Please note change in reference range.   Alkaline Phosphatase 126 38 - 126 U/L   Total Bilirubin 1.4 (H) 0.3 - 1.2 mg/dL   GFR calc non Af Amer 52 (L) >60 mL/min   GFR calc Af Amer >60 >60 mL/min  Comment: (NOTE) The eGFR has been calculated using the CKD EPI equation. This calculation has not been validated in all clinical situations. eGFR's persistently <60 mL/min signify possible Chronic Kidney Disease.    Anion gap 14 5 - 15    Comment: Performed at [facility] [address] [place], [place] [address]  CBC     Status: Abnormal   Collection Time: 09/18/17 12:59 PM  Result Value Ref Range   WBC 3.5 (L) 4.0 - 10.5 K/uL   RBC 3.82 (L) 3.87 - 5.11 MIL/uL   Hemoglobin 11.5 (L) 12.0 - 15.0 g/dL   HCT 35.0 (L) 36.0 - 46.0 %   MCV 91.6 78.0 - 100.0 fL   MCH 30.1 26.0 - 34.0 pg   MCHC 32.9 30.0 - 36.0 g/dL   RDW 15.3 11.5 - 15.5 %   Platelets 186 150 - 400 K/uL    Comment: Performed at [facility], [place] [address], [place], [place] [address]  I-Stat beta hCG blood, ED     Status: None   Collection Time: 09/18/17  1:23 PM  Result  Value Ref Range   I-stat hCG, quantitative <5.0 <5 mIU/mL   Comment 3            Comment:   GEST. AGE      CONC.  (mIU/mL)   <=1 WEEK        5 - 50     2 WEEKS       50 - 500     3 WEEKS       100 - 10,000     4 WEEKS     1,000 - 30,000        FEMALE AND NON-PREGNANT FEMALE:     LESS THAN 5 mIU/mL   Urinalysis, Routine w reflex microscopic     Status: Abnormal   Collection Time: 09/18/17  3:37 PM  Result Value Ref Range   Color, Urine AMBER (A) YELLOW    Comment: BIOCHEMICALS MAY BE AFFECTED BY COLOR   APPearance TURBID (A) CLEAR   Specific Gravity, Urine 1.019 1.005 - 1.030   pH 5.0 5.0 - 8.0   Glucose, UA NEGATIVE NEGATIVE mg/dL   Hgb urine dipstick NEGATIVE NEGATIVE   Bilirubin Urine SMALL (A) NEGATIVE   Ketones, ur NEGATIVE NEGATIVE mg/dL   Protein, ur 30 (A) NEGATIVE mg/dL   Nitrite NEGATIVE NEGATIVE   Leukocytes, UA LARGE (A) NEGATIVE   RBC / HPF 6-10 0 - 5 RBC/hpf   WBC, UA >50 (H) 0 - 5 WBC/hpf   Bacteria, UA RARE (A) NONE SEEN   Squamous Epithelial / LPF 0-5 0 - 5   WBC Clumps PRESENT    Mucus PRESENT    Hyaline Casts, UA PRESENT    Non Squamous Epithelial 0-5 (A) NONE SEEN    Comment: Performed at [facility], [place] [address] [place], [place]  [address]  Magnesium     Status: Abnormal   Collection Time: 09/18/17  9:53 PM  Result Value Ref Range   Magnesium 1.6 (L) 1.7 - 2.4 mg/dL    Comment: Performed at [facility] [address] [place], [place] [address]  Phosphorus     Status: None   Collection Time: 09/18/17  9:53 PM  Result Value Ref Range   Phosphorus 3.0 2.5 - 4.6 mg/dL    Comment: Performed at [facility], [place] [address] [place], [place] [address]  Prealbumin  Status: None   Collection Time: 09/18/17  9:53 PM  Result Value Ref Range   Prealbumin 20.5 18 - 38 mg/dL    Comment: Performed at [facility], [place] [address] [place], [place] [address]  Vitamin B12     Status: Abnormal   Collection Time: 09/18/17  9:53 PM   Result Value Ref Range   Vitamin B-12 1,574 (H) 180 - 914 pg/mL    Comment: (NOTE) This assay is not validated for testing neonatal or myeloproliferative syndrome specimens for Vitamin B12 levels. Performed at [facility], [place] [address], [place], [place] [address]   Folate     Status: Abnormal   Collection Time: 09/18/17  9:53 PM  Result Value Ref Range   Folate 3.6 (L) >5.9 ng/mL    Comment: Performed at [facility], [place] [address] [place], [place] [address]  CBC     Status: Abnormal   Collection Time: 09/19/17  4:52 AM  Result Value Ref Range   WBC 3.4 (L) 4.0 - 10.5 K/uL   RBC 3.18 (L) 3.87 - 5.11 MIL/uL   Hemoglobin 9.7 (L) 12.0 - 15.0 g/dL   HCT 29.0 (L) 36.0 - 46.0 %   MCV 91.2 78.0 - 100.0 fL   MCH 30.5 26.0 - 34.0 pg   MCHC 33.4 30.0 - 36.0 g/dL   RDW 15.2 11.5 - 15.5 %   Platelets 152 150 - 400 K/uL    Comment: Performed at [facility], [place] [address], [place], [place] [address]  Basic metabolic panel     Status: Abnormal   Collection Time: 09/19/17  4:52 AM  Result Value Ref Range   Sodium 139 135 - 145 mmol/L   Potassium 3.1 (L) 3.5 - 5.1 mmol/L   Chloride 103 98 - 111 mmol/L    Comment: Please note change in reference range.   CO2 24 22 - 32 mmol/L   Glucose, Bld 94 70 - 99 mg/dL    Comment: Please note change in reference range.   BUN 6 6 - 20 mg/dL    Comment: Please note change in reference range.   Creatinine, Ser 1.06 (H) 0.44 - 1.00 mg/dL   Calcium 8.4 (L) 8.9 - 10.3 mg/dL   GFR calc non Af Amer >60 >60 mL/min   GFR calc Af Amer >60 >60 mL/min    Comment: (NOTE) The eGFR has been calculated using the CKD EPI equation. This calculation has not been validated in all clinical situations. eGFR's persistently <60 mL/min signify possible Chronic Kidney Disease.    Anion gap 12 5 - 15    Comment: Performed at [facility] [address] [place], [place] [address]  Lactic acid, plasma     Status: None   Collection Time:  09/19/17  7:57 AM  Result Value Ref Range   Lactic Acid, Venous 1.0 0.5 - 1.9 mmol/L    Comment: Performed at [facility] [address] [place], [place] [address]    No results found.  ROSnegative except above Blood pressure 120/87, pulse 73, temperature 98.3 F (36.8 C), temperature source Oral, resp. rate 18, height 5' 4" (1.626 m), weight 80.9 kg (178 lb 5.6 oz), SpO2 99 %. Physical Exampatient looks much better then I do expect from her story and is in good spirits vital signs stable afebrile no acute distress exam pertinent for abdomen being soft nontender good bowel sounds aren't long-term fine labs and CT  reviewed Assessment/Plan: Abdominal pain nausea vomiting weight loss questionably etiology Plan: Our partner Dr. B will proceed with an endoscopy later today and I've discussed that with him and the patient including the risks benefits and methods of endoscopy with further workup and plans pending those findings although I expect stopping her narcotics and other drug use will probably be helpful in the long run  [facility] E 09/19/2017, 9:48 AM

## 2017-09-19 NOTE — Progress Notes (Signed)
Interim Progress Note:  Patient requested pain meds for her abdominal pain and back pain s/p EGD.   I visited the patient while she was resting comfortably in bed. She was very pleasant. She said the pains were a mixture of sharp and hunger pains all over her abdomen, with additional pain radiating to her back.   On physical exam: heart was RRR Lungs: CTAB Abdomen: bowel sounds were auscultated in all quadrants, soft, non-distended, negative for guarding, rebound tenderness, Rovsing, and Murphy's Sign.   Back: tenderness to palpation on paraspinal musculature but no pain with palpation of vertebral spine; there was no CVA tenderness bilaterally.  The patient believes the back pain might have something to do with the mattress.   She also asked me to explain the post-EGD paperwork she received with her results and pictures.   I ordered Bentyl 20mg  PO for abdominal discomfort and cramping.  Peggyann Shoals, DO [facility], PGY-1 09/19/2017 5:50 PM

## 2017-09-19 NOTE — Anesthesia Postprocedure Evaluation (Signed)
Anesthesia Post Note  Patient: [name]  Procedure(s) Performed: ESOPHAGOGASTRODUODENOSCOPY (EGD) WITH PROPOFOL (N/A ) BIOPSY     Patient location during evaluation: PACU Anesthesia Type: MAC Level of consciousness: awake and alert Pain management: pain level controlled Vital Signs Assessment: post-procedure vital signs reviewed and stable Respiratory status: spontaneous breathing, nonlabored ventilation, respiratory function stable and patient connected to nasal cannula oxygen Cardiovascular status: stable and blood pressure returned to baseline Postop Assessment: no apparent nausea or vomiting Anesthetic complications: no    Last Vitals:  Vitals:   09/19/17 1349 09/19/17 1359  BP: 123/84 (!) 141/96  Pulse: 79 73  Resp: 16 16  Temp: 36.5 C   SpO2: 100% 100%    Last Pain:  Vitals:   09/19/17 1359  TempSrc:   PainSc: 0-No pain                 Ryan P Ellender

## 2017-09-19 NOTE — Anesthesia Preprocedure Evaluation (Addendum)
Anesthesia Evaluation  Patient identified by MRN, date of birth, ID band Patient awake    Reviewed: Allergy & Precautions, NPO status , Patient's Chart, lab work & pertinent test results  Airway Mallampati: III  TM Distance: >3 FB Neck ROM: Full    Dental   Patient unwilling to remove 3 facial piercings. Increased risk of injury discussed.:   Pulmonary Current Smoker,    Pulmonary exam normal breath sounds clear to auscultation       Cardiovascular hypertension, Pt. on medications Normal cardiovascular exam Rhythm:Regular Rate:Normal  ECG: SR, rate 94   Neuro/Psych  Headaches, PSYCHIATRIC DISORDERS Anxiety Depression Bipolar Disorder    GI/Hepatic negative GI ROS, Neg liver ROS,   Endo/Other  negative endocrine ROS  Renal/GU negative Renal ROS     Musculoskeletal negative musculoskeletal ROS (+)   Abdominal   Peds  Hematology  (+) anemia ,   Anesthesia Other Findings Nausea, vomiting, weight loss  Reproductive/Obstetrics hcg negative                            Anesthesia Physical Anesthesia Plan  ASA: III  Anesthesia Plan: MAC   Post-op Pain Management:    Induction: Intravenous  PONV Risk Score and Plan: 1 and Treatment may vary due to age or medical condition and Propofol infusion  Airway Management Planned: Natural Airway  Additional Equipment:   Intra-op Plan:   Post-operative Plan:   Informed Consent: I have reviewed the patients History and Physical, chart, labs and discussed the procedure including the risks, benefits and alternatives for the proposed anesthesia with the patient or authorized representative who has indicated his/her understanding and acceptance.   Dental advisory given  Plan Discussed with: CRNA  Anesthesia Plan Comments:         Anesthesia Quick Evaluation

## 2017-09-19 NOTE — Progress Notes (Signed)
Family Medicine Teaching Service Daily Progress Note Intern Pager: [phone]  Patient name: Kristin [name] Medical record number: [id number] Date of birth: [birth year] Age: 42 y.o. Gender: female  Primary Care Provider: Marthenia Rolling, DO Consultants: GI Code Status: Full code  Pt Overview and Major Events to Date:  7/9 admitted with nausea, vomiting, abdominal cramping  Assessment and Plan:  Kristin [name] is a 42 y.o. female presenting with nausea and vomiting. PMH is significant for HTN, bipolar disorder, anxiety, obesity, tobacco use, alcohol abuse, knee and back pain.  Nausea and vomiting: Endoscopy on 7/10 showed Esophagitis, biopsies were taken for H pylori and Celiac.  Started patient on PPI BID and Carafate, full liquid diet and recommend repeat Endoscopy in 8 weeks. Patient states that her stomach pain has improved, she only had tomato soup yesterday. -Monitor vitals -F/U [facility] consult today, appreciate recommendations - on Protonix and carafate per GI - f/u biopsy results - 8 week colonoscopy outpatient - Bentyl 20mg  PO - Famotidine BID -Zofran PRN - recheck if eating this afternoon  UTI: Urine culture positive for E coli. - Start Keflex 500mg  Q12hr x 7 days  Pancytopenia: History of leukopenia and anemia throughout admission.  On 7/11 platelets 135.  No concern for HIT.  Patient also has a prior history of thrombocytopenia that is more pronounced. - Recommend follow up CBC outpatient  Peripheral neuropathy Patient notes a history of peripheral neuropathy with reported slipped disc.    On admission, complained of new numbness of bilateral inner thighs.  No bladder or bowel incontinence. No concern for cauda equina.  B12 on admission 1,574.  Folate 3.6 on 7/9.Stable. - On Folic Acid  HTN: BP wnl on admission at 105/71, could be lower than baseline due to dehydration.  Takes lisinopril-HCTZ 10-12.5 mg at home. 130/101 this AM.  - restart home BP meds -Monitor vitals  and restart home meds as needed  AKI: Improving.  Creatinine on admission 1.25, baseline appears to be ~0.7.  Patient has also been taking NSAIDs for pain relief at home, which could contribute to this.  Cr improved to 0.99 this AM.  - Continue 1.5 mIVF @ 150 ml/hr - no nephrotoxic medications  Bipolar 1 disorder Per chart review patient takes Seroquel and Zoloft at home.  Patient reports not taking Seroquel at home but does take Zoloft. - Continue Zoloft 50 mg daily - would benefit from taking seroquel for bipolar disorder, will advise patient to resume at discharge, on $9 list at [facility]  Asymptomatic positive UA (leukocytes) Patient did not endorse any symptoms of UTI during interview.  During work-up in ED urinalysis and microscopy were shown to have significant leukocytes, rare bacteria and white blood cells in clumps.  Patient was given 1 dose of ceftriaxone in ED. -Will not continue to treat asymptomatic bacteriuria  Cigarette smoker Patient reports smoking few cigarettes during this recent illness.  Patient declines nicotine patch  Alcohol use Patient reports consuming several drinks of alcohol on the weekends.  Last drink was 2 days ago.  Patient reports never having experienced withdrawal.  Patient does not expect to experience withdrawal symptoms in hospital. Denies any current illicit drug use. - order UDS  Hypokalemia, resolved. K mildly reduced at 3.4 on admission.  Received Kdur 40 mEq on 7/9.  4.3 this AM. - recheck am BMP  FEN/GI: NPO currently for Endoscopy PPx: Lovenox  Disposition: Home  Subjective:  Patient feeling well today, states abdominal pain has improved.  Notes a  headache for which she was given tylenol.  States that she would like to go home.  Had some tomato soup yesterday and tolerated it well.  Objective: Temp:  [97.7 F (36.5 C)-98.4 F (36.9 C)] 98.2 F (36.8 C) (07/11 0435) Pulse Rate:  [72-79] 79 (07/11 0435) Resp:  [13-20] 18 (07/11  0435) BP: (123-147)/(84-101) 130/101 (07/11 0435) SpO2:  [98 %-100 %] 98 % (07/11 0435) Weight:  [182 lb 4.8 oz (82.7 kg)] 182 lb 4.8 oz (82.7 kg) (07/10 2047)   Physical Exam: General: 42 yo female in NAD, sleeping, but easily aroused Cardiovascular: RRR no m/r/g  Respiratory: CTAB no wheezes, no crackles Abdomen: Soft, epigastric tenderness, + bowel sounds Extremities: No edema Skin: warm and dry  Laboratory: Recent Labs  Lab 09/18/17 1259 09/19/17 0452 09/20/17 0541  WBC 3.5* 3.4* 3.5*  HGB 11.5* 9.7* 9.3*  HCT 35.0* 29.0* 27.6*  PLT 186 152 135*   Recent Labs  Lab 09/18/17 1259 09/19/17 0452 09/20/17 0541  NA 139 139 137  K 3.4* 3.1* 4.3  CL 99 103 108  CO2 26 24 21*  BUN 7 6 <5*  CREATININE 1.25* 1.06* 0.99  CALCIUM 9.6 8.4* 8.5*  PROT 7.3  --   --   BILITOT 1.4*  --   --   ALKPHOS 126  --   --   ALT 15  --   --   AST 80*  --   --   GLUCOSE 102* 94 98     Imaging/Diagnostic Tests: No results found.  Rittberger, Solmon Ice, DO 09/20/2017, 7:58 AM PGY-1, [facility] FPTS Intern pager: [phone], text pages welcome

## 2017-09-19 NOTE — Anesthesia Procedure Notes (Signed)
Procedure Name: MAC Date/Time: 09/19/2017 1:10 PM Performed by: Neldon Newport, CRNA Pre-anesthesia Checklist: Timeout performed, Patient being monitored, Suction available, Emergency Drugs available and Patient identified Oxygen Delivery Method: Nasal cannula

## 2017-09-19 NOTE — Progress Notes (Signed)
Initial Nutrition Assessment  DOCUMENTATION CODES:   Obesity unspecified  INTERVENTION:   Magic cup TID with meals, each supplement provides 290 kcal and 9 grams of protein  NUTRITION DIAGNOSIS:   Moderate Malnutrition related to acute illness(esophagitis) as evidenced by energy intake < 75% for > 7 days, percent weight loss.  GOAL:   Patient will meet greater than or equal to 90% of their needs  MONITOR:   PO intake, Supplement acceptance, Weight trends, Labs, Diet advancement  REASON FOR ASSESSMENT:   Malnutrition Screening Tool    ASSESSMENT:   Patient with PMH significant for HTN and bipolar disorder. Presents this admission with generalzied abdominal pain with associated nausea and vomiting.    7/10- EGD reveals esophagitis   Pt reports intake began to decrease on 07/28/17 after eating at a baby shower. During this time period she could only tolerate small snack options like chips, pineapple cups, and italian ice. She did not eat full meals as the sight of them made her nauseous. She was advanced to full liquid s/p EDG and has tolerated sips of water. Discussed the importance of protein intake for preservation of lean body mass. Pt amendable to [employer]. She does not like Ensure/Boost.   Pt endorses a UBW of 200 lb and a recent wt loss of 30 lb since 07/2016. Records indicate pt weighed 199 lb 06/14/17 and 178 lb this admission (10.5% wt loss in two months, significant for time frame). Nutrition-Focused physical exam completed.  Medications reviewed and include: folic acid, Carafate Labs reviewed: K 3.1 (L) Mg 1.6 (L)  NUTRITION - FOCUSED PHYSICAL EXAM:    Most Recent Value  Orbital Region  No depletion  Upper Arm Region  No depletion  Thoracic and Lumbar Region  No depletion  Buccal Region  No depletion  Temple Region  No depletion  Clavicle Bone Region  No depletion  Clavicle and Acromion Bone Region  No depletion  Scapular Bone Region  No depletion  Dorsal Hand   No depletion  Patellar Region  No depletion  Anterior Thigh Region  No depletion  Posterior Calf Region  No depletion  Edema (RD Assessment)  None  Hair  Reviewed  Eyes  Reviewed  Mouth  Reviewed  Skin  Reviewed  Nails  Reviewed     Diet Order:   Diet Order           Diet full liquid Room service appropriate? Yes; Fluid consistency: Thin  Diet effective now          EDUCATION NEEDS:   Education needs have been addressed  Skin:  Skin Assessment: Reviewed RN Assessment  Last BM:  09/17/17  Height:   Ht Readings from Last 1 Encounters:  09/18/17 5\' 4"  (1.626 m)    Weight:   Wt Readings from Last 1 Encounters:  09/18/17 178 lb 5.6 oz (80.9 kg)    Ideal Body Weight:  54.5 kg  BMI:  Body mass index is 30.61 kg/m.  Estimated Nutritional Needs:   Kcal:  1650-1850 kcal   Protein:  80-90 g  Fluid:  >1.6 L/day    Vanessa Kick RD, LDN Clinical Nutrition Pager # [phone]

## 2017-09-19 NOTE — Progress Notes (Signed)
Interim Progress Note:  Was just informed patient's IV came out. Patient refusing new IV because she does not want to be "stuck".  We will try medications p.o.  If nausea and vomiting persists, and patient is unable to take medicines by mouth, a new non-negotiable IV will be inserted.  Peggyann Shoals, DO [facility], PGY-1 09/19/2017 4:35 PM

## 2017-09-19 NOTE — Progress Notes (Signed)
Patient c/o itching in her vagina with no discharge. MD on call text paged. Will continue to monitor. Brilee Port, Drinda Butts, Charity fundraiser

## 2017-09-19 NOTE — Progress Notes (Signed)
EKG has been completed on this patient. Put in patient's chart. Will continue to monitor.   Lillia Pauls RN

## 2017-09-19 NOTE — Op Note (Signed)
[facility] Patient Name: Kristin [name] Procedure Date : 09/19/2017 MRN: [id number] Attending MD: Kathi Der , MD Date of Birth: 1976-03-04 CSN: [id number] Age: 42 Admit Type: Inpatient Procedure:                Upper GI endoscopy Indications:              Nausea with vomiting, Weight loss Providers:                Kathi Der, MD, Clearnce Sorrel, RN, Madalyn Rob, Technician Referring MD:              Medicines:                Sedation Administered by an Anesthesia Professional Complications:            No immediate complications. Estimated Blood Loss:     Estimated blood loss was minimal. Procedure:                Pre-Anesthesia Assessment:                           - Prior to the procedure, a History and Physical                            was performed, and patient medications and                            allergies were reviewed. The patient's tolerance of                            previous anesthesia was also reviewed. The risks                            and benefits of the procedure and the sedation                            options and risks were discussed with the patient.                            All questions were answered, and informed consent                            was obtained. Prior Anticoagulants: The patient has                            taken no previous anticoagulant or antiplatelet                            agents. ASA Grade Assessment: III - A patient with                            severe systemic disease. After reviewing the risks  and benefits, the patient was deemed in                            satisfactory condition to undergo the procedure.                           After obtaining informed consent, the endoscope was                            passed under direct vision. Throughout the                            procedure, the patient's blood pressure, pulse, and                           oxygen saturations were monitored continuously. The                            [id number] ([id number]) scope was introduced through the                            mouth, and advanced to the second part of duodenum.                            The upper GI endoscopy was technically difficult                            and complex due to the patient's poor cooperation.                            Successful completion of the procedure was aided by                            increasing the dose of sedation medication. The                            patient tolerated the procedure well. Scope In: Scope Out: Findings:      LA Grade C (one or more mucosal breaks continuous between tops of 2 or       more mucosal folds, less than 75% circumference) esophagitis with no       bleeding was found in the distal esophagus. Biopsies were taken with a       cold forceps for histology.      The exam of the esophagus was otherwise normal.      Scattered mild inflammation characterized by erythema, friability and       granularity was found in the entire examined stomach. Biopsies were       taken with a cold forceps for Helicobacter pylori testing.      The cardia and gastric fundus were normal on retroflexion.      The duodenal bulb, first portion of the duodenum and second portion of       the duodenum were normal. Biopsies were taken with a cold forceps for       histology. Impression:               -  LA Grade C erosive esophagitis. Biopsied.                           - Chronic gastritis. Biopsied.                           - Normal duodenal bulb, first portion of the                            duodenum and second portion of the duodenum.                            Biopsied. Recommendation:           - Return patient to hospital ward for ongoing care.                           - Full liquid diet.                           - Continue present medications.                           -  Await pathology results.                           - Repeat upper endoscopy in 2 months to check                            healing. Procedure Code(s):        --- Professional ---                           [phone], Esophagogastroduodenoscopy, flexible,                            transoral; with biopsy, single or multiple Diagnosis Code(s):        --- Professional ---                           K20.8, Other esophagitis                           K29.50, Unspecified chronic gastritis without                            bleeding                           R11.2, Nausea with vomiting, unspecified                           R63.4, Abnormal weight loss CPT copyright 2017 American Medical Association. All rights reserved. The codes documented in this report are preliminary and upon coder review may  be revised to meet current compliance requirements. Kathi Der, MD Kathi Der, MD 09/19/2017 1:45:47 PM Number of Addenda: 0

## 2017-09-19 NOTE — Progress Notes (Addendum)
Received page regarding vaginal itching.  Spoke with patient at bedside who endorsed vaginal itching for about 2 days.  Patient denies dysuria and polyuria at this time.  Patient reports no vaginal discharge or odor.  This is a similar presentation to past yeast infections per patient.  Will treat with Diflucan.  Patient had a UA positive for leukocytes, and microscopy significant for leukocytes in clusters.  Patient was not treated for UTI at that time due to the lack of reported symptoms.  Please consider UTI if symptoms are not resolved resolved with Diflucan.

## 2017-09-19 NOTE — Progress Notes (Signed)
Family Medicine Teaching Service Daily Progress Note Intern Pager: [phone]  Patient name: Kristin [name] Medical record number: [id number] Date of birth: 1975-11-09 Age: 42 y.o. Gender: female  Primary Care Provider: Marthenia Rolling, DO Consultants: GI Code Status: Full code  Pt Overview and Major Events to Date:  7/9 admitted with nausea, vomiting, abdominal cramping  Assessment and Plan:  Kristin [name] is a 42 y.o. female presenting with nausea and vomiting. PMH is significant for HTN, bipolar disorder, anxiety, obesity, tobacco use, alcohol abuse, knee and back pain.  Nausea and vomiting: Ongoing for the past two months, worsening over the last few days.  Vitals are wnl.  Labs significant for slightly reduced potassium and elevated creatinine, although it is stable from one month ago.  Lipase wnl.  Patient has been seen in clinic by GI since mid May and was planning to follow-up with GI for capsule endoscopy in the coming week.  Right upper quadrant ultrasound on 6/10 showed no evidence of acute cholecystitis and possible steatosis; these results were corroborated by CT abdomen on the same day. Patient also reports a 30 lb weight loss, since May, chart review reveals weight of 199# in clinic 06/2017, weight on admission 178#. GI was consulted in the ED and agreed with admission for observation and hydration, and they will see her on 7/10.  She was given 2L NS in the ED. LA ordered for mesenteric ischemia rule out, WNL.  Patient denies vomiting since admission, notes nausea improved on zofran.  Continues to experience abdominal cramping which she rates 10/10.  GI recommends endoscopy today. -Monitor vitals -F/U [facility] consult today, appreciate recommendations -Continue IV fluids at 150 cc NS per hour - Famotidine IV - EKG to check on QT interval in setting of zofran use, although EKG in July 2018 with normal QT -Zofran PRN -Monitor mg and phos for refeeding syndrome - FOBT  pending  Peripheral neuropathy Patient reports a history peripheral neuropathy below the knees due to vertebral issue, no documentation was found in chart.  Decreased sensation of medial thighs and lower abdomen/pelvis is new.  No bladder or bowel incontinence. B12 on admission 1,574.  Folate 3.6 on 7/9. Patient also notes a history of a known slipped disc in her back. - Start Folic Acid  HTN: BP wnl on admission at 105/71, could be lower than baseline due to dehydration.  Takes lisinopril-HCTZ 10-12.5 mg at home. 101/73 this AM. - hold BP meds d/t low-normal BP and AKI -Monitor vitals and restart home meds as needed  AKI: Creatinine on admission 1.25, baseline appears to be ~0.7.  Patient has also been taking NSAIDs for pain relief at home, which could contribute to this.  Cr improved to 1.06 this AM. - Continue 1.5 mIVF @ 150 ml/hr - no nephrotoxic medications  Bipolar 1 disorder Per chart review patient takes Seroquel and Zoloft at home.  Patient reports not taking Seroquel at home but does take Zoloft. - Continue Zoloft 50 mg daily - would benefit from taking seroquel for bipolar disorder, will advise patient to resume at discharge, on $9 list at [facility]  Asymptomatic positive UA (leukocytes) Patient did not endorse any symptoms of UTI during interview.  During work-up in ED urinalysis and microscopy were shown to have significant leukocytes, rare bacteria and white blood cells in clumps.  Patient was given 1 dose of ceftriaxone in ED. -Will not continue to treat asymptomatic bacteriuria  Cigarette smoker Patient reports smoking few cigarettes during this recent  illness.  Patient declines nicotine patch  Alcohol use Patient reports consuming several drinks of alcohol on the weekends.  Last drink was 2 days ago.  Patient reports never having experienced withdrawal.  Patient does not expect to experience withdrawal symptoms in hospital. Denies any current illicit drug use. -  order UDS  Hypokalemia K mildly reduced at 3.4 on admission.  Received Kdur 40 mEq on 7/9.  3.1 this AM. - 40 mEq Kdur x 2 - recheck am BMP  FEN/GI: NPO currently for Endoscopy PPx: Lovenox  Disposition: Home pending clinical improvement  Subjective:  Patient complains of 10/10 abdominal pain.  Denies vomiting since admission, nausea relieved by zofran.  Also admits to some numbness in inner thighs bilateral, denies numbness in vaginal area, no urinary incontinence or fecal incontinence.  Objective: Temp:  [98.1 F (36.7 C)-98.4 F (36.9 C)] 98.1 F (36.7 C) (07/10 0539) Pulse Rate:  [76-107] 76 (07/10 0539) Resp:  [16-18] 16 (07/10 0539) BP: (101-132)/(65-92) 101/73 (07/10 0539) SpO2:  [89 %-100 %] 100 % (07/10 0539) Weight:  [178 lb 5.6 oz (80.9 kg)] 178 lb 5.6 oz (80.9 kg) (07/09 2046)  Physical Exam: General: 42 yo female in NAD, resting comfortably in bed Cardiovascular: RRR no m/r/g  Respiratory: CTAB no wheezes, crackles Abdomen: Soft, diffusely mildly tender to palpation, + bowel sounds Extremities: Sensation intact, no edema, 5/5 stregnth BLE, 1+ Patellar reflex  Laboratory: Recent Labs  Lab 09/18/17 1259 09/19/17 0452  WBC 3.5* 3.4*  HGB 11.5* 9.7*  HCT 35.0* 29.0*  PLT 186 152   Recent Labs  Lab 09/18/17 1259 09/19/17 0452  NA 139 139  K 3.4* 3.1*  CL 99 103  CO2 26 24  BUN 7 6  CREATININE 1.25* 1.06*  CALCIUM 9.6 8.4*  PROT 7.3  --   BILITOT 1.4*  --   ALKPHOS 126  --   ALT 15  --   AST 80*  --   GLUCOSE 102* 94     Imaging/Diagnostic Tests: No results found.  Rittberger, Solmon Ice, DO 09/19/2017, 7:36 AM PGY-1, [facility] FPTS Intern pager: [phone], text pages welcome

## 2017-09-20 ENCOUNTER — Encounter (HOSPITAL_COMMUNITY): Payer: Self-pay | Admitting: Student

## 2017-09-20 ENCOUNTER — Observation Stay (HOSPITAL_COMMUNITY): Payer: BLUE CROSS/BLUE SHIELD

## 2017-09-20 DIAGNOSIS — K209 Esophagitis, unspecified without bleeding: Secondary | ICD-10-CM

## 2017-09-20 DIAGNOSIS — N179 Acute kidney failure, unspecified: Secondary | ICD-10-CM | POA: Diagnosis not present

## 2017-09-20 DIAGNOSIS — E860 Dehydration: Secondary | ICD-10-CM | POA: Diagnosis not present

## 2017-09-20 DIAGNOSIS — R112 Nausea with vomiting, unspecified: Secondary | ICD-10-CM | POA: Diagnosis not present

## 2017-09-20 DIAGNOSIS — F3170 Bipolar disorder, currently in remission, most recent episode unspecified: Secondary | ICD-10-CM | POA: Diagnosis not present

## 2017-09-20 LAB — BASIC METABOLIC PANEL
ANION GAP: 8 (ref 5–15)
BUN: <5 mg/dL — ABNORMAL LOW (ref 6–20)
CALCIUM: 8.5 mg/dL — AB (ref 8.9–10.3)
CO2: 21 mmol/L — ABNORMAL LOW (ref 22–32)
Chloride: 108 mmol/L (ref 98–111)
Creatinine, Ser: 0.99 mg/dL (ref 0.44–1.00)
GFR calc Af Amer: >60 mL/min (ref >60)
Glucose, Bld: 98 mg/dL (ref 70–99)
Potassium: 4.3 mmol/L (ref 3.5–5.1)
Sodium: 137 mmol/L (ref 135–145)

## 2017-09-20 LAB — CBC
HCT: 27.6 % — ABNORMAL LOW (ref 36.0–46.0)
HEMOGLOBIN: 9.3 g/dL — AB (ref 12.0–15.0)
MCH: 31.3 pg (ref 26.0–34.0)
MCHC: 33.7 g/dL (ref 30.0–36.0)
MCV: 92.9 fL (ref 78.0–100.0)
Platelets: 135 10*3/uL — ABNORMAL LOW (ref 150–400)
RBC: 2.97 MIL/uL — AB (ref 3.87–5.11)
RDW: 15 % (ref 11.5–15.5)
WBC: 3.5 10*3/uL — ABNORMAL LOW (ref 4.0–10.5)

## 2017-09-20 LAB — URINE CULTURE: Culture: 80000 — AB

## 2017-09-20 MED ORDER — GADOBENATE DIMEGLUMINE 529 MG/ML IV SOLN
20.0000 mL | Freq: Once | INTRAVENOUS | Status: AC
  Administered 2017-09-20: 20 mL via INTRAVENOUS

## 2017-09-20 MED ORDER — ACETAMINOPHEN 325 MG PO TABS
650.0000 mg | ORAL_TABLET | Freq: Four times a day (QID) | ORAL | Status: DC | PRN
  Administered 2017-09-20 – 2017-09-21 (×2): 650 mg via ORAL
  Filled 2017-09-20 (×2): qty 2

## 2017-09-20 MED ORDER — CEPHALEXIN 500 MG PO CAPS
500.0000 mg | ORAL_CAPSULE | Freq: Two times a day (BID) | ORAL | Status: DC
  Administered 2017-09-20 – 2017-09-21 (×3): 500 mg via ORAL
  Filled 2017-09-20 (×4): qty 1

## 2017-09-20 NOTE — Progress Notes (Addendum)
[facility] Progress Note  Kristin [name] 42 y.o. Dec 30, 1975  CC:  Nausea, vomiting, weight loss.   Subjective: she continues to nausea. No further vomiting. Abdominal pain somewhat improved.  ROS: negative for chest pain, palpitations, shortness of breath or hemoptysis.  Objective: Vital signs in last 24 hours: Vitals:   09/20/17 0435 09/20/17 0942  BP: (!) 130/101 109/82  Pulse: 79 78  Resp: 18 18  Temp: 98.2 F (36.8 C) 98.2 F (36.8 C)  SpO2: 98% 100%    Physical Exam:  General:  Alert, cooperative, no distress, appears stated age  Head:  Normocephalic, without obvious abnormality, atraumatic  Eyes:  , EOM's intact,   Lungs:   Clear to auscultation bilaterally, respirations unlabored  Heart:  Regular rate and rhythm, S1, S2 normal  Abdomen:   Soft, nontender, nondistended, bowel sounds present. No peritoneal signs  Extremities: Extremities normal, atraumatic, no  edema       Lab Results: Recent Labs    09/18/17 2153 09/19/17 0452 09/20/17 0541  NA  --  139 137  K  --  3.1* 4.3  CL  --  103 108  CO2  --  24 21*  GLUCOSE  --  94 98  BUN  --  6 <5*  CREATININE  --  1.06* 0.99  CALCIUM  --  8.4* 8.5*  MG 1.6*  --   --   PHOS 3.0  --   --    Recent Labs    09/18/17 1259  AST 80*  ALT 15  ALKPHOS 126  BILITOT 1.4*  PROT 7.3  ALBUMIN 3.9   Recent Labs    09/19/17 0452 09/20/17 0541  WBC 3.4* 3.5*  HGB 9.7* 9.3*  HCT 29.0* 27.6*  MCV 91.2 92.9  PLT 152 135*   No results for input(s): LABPROT, INR in the last 72 hours.    Assessment/Plan: - intractable nausea and vomiting. EGD yesterday showed EGD showed LA grade C esophagitis and Mild Gastritis. biopsies pending - weight loss.CT abdomen pelvis with IV contrast on 08/20/2017 showed focal decreased density adjacent to gallbladder fossa could be a focal fatty infiltration. MRI was recommended for further evaluation. - chronic anemia. - Mild elevated  AST.  Recommendations -------------------------- - MRI abdomen with liver protocol. - Advance diet to soft diet - Continue Twice a day PPI and Carafate. - hold off on gastric empty study as she has received Reglan on admission. -  Hopefully discharge tomorrow if continues to have improvement in symptoms.   Kathi Der MD, FACP 09/20/2017, 12:44 PM  Contact #  [phone]

## 2017-09-20 NOTE — Progress Notes (Signed)
RN brought patient's tylenol for her headache and asked how is her headache.Patient said " it's coming down because I took my blood pressure pill,they haven't given me my pill all day". Explained to patient that there's a reason why the doctors didn't order her BP med. Per MD note,pt came in with BP on the low side and in AKI. Explained  to patient the reason why it was not ordered and advised not to take any medicines from home.Patient verbalized understanding. MD on call text paged.Medicine (lisinopril/hctz) counted and brought down to pharmacy with patient's consent. Reminded patient to ask for it when she gets discharged. Zackrey Dyar, Drinda Butts, Charity fundraiser

## 2017-09-20 NOTE — Progress Notes (Signed)
Family Medicine Teaching Service Daily Progress Note Intern Pager: [phone]  Patient name: Kristin [name] Medical record number: [id number] Date of birth: 1976-01-18 Age: 42 y.o. Gender: female  Primary Care Provider: Marthenia Rolling, DO Consultants: GI Code Status: Full code  Pt Overview and Major Events to Date:  7/9 admitted with nausea, vomiting, abdominal cramping  Assessment and Plan:  Kristin [name] is a 42 y.o. female presenting with nausea and vomiting. PMH is significant for HTN, bipolar disorder, anxiety, obesity, tobacco use, alcohol abuse, knee and back pain.  Nausea and vomiting: Improved Patient states that she has been tolerating a p.o. diet well.  MRI on 7/11 shows diffuse hepatic steatosis with more focal area of fatty infiltration in the left hepatic lobe which corresponds to lesion seen on previous CT exam. Endoscopy on 7/10 positive for esophagitis.   started patient on PPI BID and Carafate, full liquid diet and recommend repeat Endoscopy in 8 weeks.  -Continue to monitor vitals -F/U GI consult, appreciate recommendations -Continue Protonix and carafate per GI - f/u biopsy results for H. pylori and celiac disease - 8 week endoscopy outpatient - Bentyl 20mg  PO as needed for abdominal cramping - Famotidine BID -Zofran PRN -Patient is medically stable for discharge at this time, waiting for patient to see GI today   UTI: Acute  Culture positive for E coli.  S/P 1 dose of Rocephin in ED. -Continue Keflex 500mg  Q12hr (7/11- )  Pancytopenia: Improved Stable leukopenia throughout admission.  Decreasing hemoglobin 11.5 on admission, 9.5 this AM.  Thrombocytopenia resolved, 151 this AM. -Outpatient CBC  Peripheral neuropathy: Chronic Patient notes that this is stable and continues to deny urinary and fecal incontinence. History of peripheral neuropathy with reported slipped disc.    Acute numbness of bilateral inner thighs with no concern for cauda equina.  B12  within normal limits.  Folate 3.6 on 7/9. -Continue folic Acid  HTN: Stable  This morning 121/91 -We will restart lisinopril-hydrochlorothiazide prior to discharge -Monitor vitals  AKI: Improving.   Creatinine this morning 0.98, baseline about 0.7  -Continue to encourage PO intake   Bipolar 1 disorder: Stable On Zoloft.  Has a prescription for Seroquel but has not picked up due to cost issues. - Continue Zoloft 50 mg daily - would benefit from taking seroquel for bipolar disorder, will advise patient to resume at discharge, on $9 list at [facility]   Cigarette smoker: Chronic Declines nicotine patch  Drug use UDS positive for cocaine  Hypokalemia, resolved.   S/P K-Dur 40 mEq.  K 3.7 -Continue to monitor BMP  FEN/GI: Soft foods PPx: Lovenox  Disposition: Home  Subjective:  Patient states that she is feeling much better today and is looking forward to going home.  Notes that she has been able to tolerate a solid food diet by mouth.  Notes a slight headache this morning.  Objective: Temp:  [98.1 F (36.7 C)-98.4 F (36.9 C)] 98.4 F (36.9 C) (07/12 0526) Pulse Rate:  [72-79] 72 (07/12 0526) Resp:  [16-20] 20 (07/12 0526) BP: (109-129)/(81-92) 121/91 (07/12 0526) SpO2:  [100 %] 100 % (07/12 0526) Weight:  [180 lb 11.2 oz (82 kg)] 180 lb 11.2 oz (82 kg) (07/12 0526)  Physical exam: General: 42 year old female in no acute distress, sleeping Cardio: Regular rate and rhythm, no murmurs rubs or gallops  Lungs: Clear to auscultation bilaterally with no wheezes or crackles Abdomen: Soft, epigastric tenderness, positive bowel sounds Extremities: No edema, sensation intact bilateral lower extremities Skin: Warm  and dry  Laboratory: Recent Labs  Lab 09/19/17 0452 09/20/17 0541 09/21/17 0430  WBC 3.4* 3.5* 3.6*  HGB 9.7* 9.3* 9.5*  HCT 29.0* 27.6* 28.2*  PLT 152 135* 151   Recent Labs  Lab 09/18/17 1259 09/19/17 0452 09/20/17 0541 09/21/17 0430  NA 139  139 137 139  K 3.4* 3.1* 4.3 3.7  CL 99 103 108 104  CO2 26 24 21* 23  BUN 7 6 <5* <5*  CREATININE 1.25* 1.06* 0.99 0.98  CALCIUM 9.6 8.4* 8.5* 8.6*  PROT 7.3  --   --   --   BILITOT 1.4*  --   --   --   ALKPHOS 126  --   --   --   ALT 15  --   --   --   AST 80*  --   --   --   GLUCOSE 102* 94 98 88     Imaging/Diagnostic Tests: No results found.  Rittberger, Solmon Ice, DO 09/21/2017, 6:47 AM PGY-1,  Family Medicine FPTS Intern pager: [phone], text pages welcome

## 2017-09-20 NOTE — Progress Notes (Signed)
Patient c/o headache,BP 130/101 MD on call text paged.Will continue to monitor. Bluford Sedler, Drinda Butts, Charity fundraiser

## 2017-09-21 DIAGNOSIS — E860 Dehydration: Secondary | ICD-10-CM | POA: Diagnosis not present

## 2017-09-21 DIAGNOSIS — R112 Nausea with vomiting, unspecified: Secondary | ICD-10-CM | POA: Diagnosis not present

## 2017-09-21 DIAGNOSIS — K209 Esophagitis, unspecified: Secondary | ICD-10-CM | POA: Diagnosis not present

## 2017-09-21 DIAGNOSIS — F3170 Bipolar disorder, currently in remission, most recent episode unspecified: Secondary | ICD-10-CM | POA: Diagnosis not present

## 2017-09-21 LAB — BASIC METABOLIC PANEL
ANION GAP: 12 (ref 5–15)
BUN: <5 mg/dL — ABNORMAL LOW (ref 6–20)
CHLORIDE: 104 mmol/L (ref 98–111)
CO2: 23 mmol/L (ref 22–32)
Calcium: 8.6 mg/dL — ABNORMAL LOW (ref 8.9–10.3)
Creatinine, Ser: 0.98 mg/dL (ref 0.44–1.00)
GFR calc Af Amer: >60 mL/min (ref >60)
GFR calc non Af Amer: >60 mL/min (ref >60)
GLUCOSE: 88 mg/dL (ref 70–99)
POTASSIUM: 3.7 mmol/L (ref 3.5–5.1)
SODIUM: 139 mmol/L (ref 135–145)

## 2017-09-21 LAB — CBC
HEMATOCRIT: 28.2 % — AB (ref 36.0–46.0)
HEMOGLOBIN: 9.5 g/dL — AB (ref 12.0–15.0)
MCH: 30.7 pg (ref 26.0–34.0)
MCHC: 33.7 g/dL (ref 30.0–36.0)
MCV: 91.3 fL (ref 78.0–100.0)
Platelets: 151 10*3/uL (ref 150–400)
RBC: 3.09 MIL/uL — ABNORMAL LOW (ref 3.87–5.11)
RDW: 15 % (ref 11.5–15.5)
WBC: 3.6 10*3/uL — AB (ref 4.0–10.5)

## 2017-09-21 LAB — PHOSPHORUS: PHOSPHORUS: 3.2 mg/dL (ref 2.5–4.6)

## 2017-09-21 LAB — MAGNESIUM: Magnesium: 1.6 mg/dL — ABNORMAL LOW (ref 1.7–2.4)

## 2017-09-21 MED ORDER — FOLIC ACID 1 MG PO TABS: 1.0000 mg | ORAL_TABLET | Freq: Every day | ORAL | 0 refills | Status: DC

## 2017-09-21 MED ORDER — CEPHALEXIN 500 MG PO CAPS: 500.0000 mg | ORAL_CAPSULE | Freq: Two times a day (BID) | ORAL | 0 refills | Status: DC

## 2017-09-21 MED ORDER — QUETIAPINE FUMARATE 50 MG PO TABS
ORAL_TABLET | ORAL | 0 refills | Status: DC
Start: 2017-09-21 — End: 2017-10-16

## 2017-09-21 MED ORDER — QUETIAPINE FUMARATE 200 MG PO TABS: 200.0000 mg | ORAL_TABLET | Freq: Every day | ORAL | 0 refills | Status: DC

## 2017-09-21 MED ORDER — SUCRALFATE 1 GM/10ML PO SUSP: 1.0000 g | Freq: Three times a day (TID) | ORAL | 0 refills | Status: DC

## 2017-09-21 MED ORDER — FAMOTIDINE 20 MG PO TABS: 20.0000 mg | ORAL_TABLET | Freq: Two times a day (BID) | ORAL | 0 refills | Status: DC

## 2017-09-21 MED ORDER — PANTOPRAZOLE SODIUM 40 MG PO TBEC: 40.0000 mg | DELAYED_RELEASE_TABLET | Freq: Two times a day (BID) | ORAL | 0 refills | Status: DC

## 2017-09-21 NOTE — Progress Notes (Signed)
[facility] Progress Note  Kristin [name] 42 y.o. Oct 22, 1975  CC:  Nausea, vomiting, weight loss.   Subjective: feeling somewhat better. Tolerating soft diet. One episode of vomiting which was not associated with food.  ROS: negative for chest pain, palpitations, shortness of breath or hemoptysis.  Objective: Vital signs in last 24 hours: Vitals:   09/20/17 2044 09/21/17 0526  BP: 122/81 (!) 121/91  Pulse: 79 72  Resp: 16 20  Temp: 98.1 F (36.7 C) 98.4 F (36.9 C)  SpO2: 100% 100%    Physical Exam:  General:  Alert, cooperative, no distress, appears stated age  Head:  Normocephalic, without obvious abnormality, atraumatic  Eyes:  , EOM's intact,   Lungs:   Clear to auscultation bilaterally, respirations unlabored  Heart:  Regular rate and rhythm, S1, S2 normal  Abdomen:   Soft, nontender, nondistended, bowel sounds present. No peritoneal signs  Extremities: Extremities normal, atraumatic, no  edema       Lab Results: Recent Labs    09/18/17 2153  09/20/17 0541 09/21/17 0430  NA  --    < > 137 139  K  --    < > 4.3 3.7  CL  --    < > 108 104  CO2  --    < > 21* 23  GLUCOSE  --    < > 98 88  BUN  --    < > <5* <5*  CREATININE  --    < > 0.99 0.98  CALCIUM  --    < > 8.5* 8.6*  MG 1.6*  --   --  1.6*  PHOS 3.0  --   --  3.2   < > = values in this interval not displayed.   Recent Labs    09/18/17 1259  AST 80*  ALT 15  ALKPHOS 126  BILITOT 1.4*  PROT 7.3  ALBUMIN 3.9   Recent Labs    09/20/17 0541 09/21/17 0430  WBC 3.5* 3.6*  HGB 9.3* 9.5*  HCT 27.6* 28.2*  MCV 92.9 91.3  PLT 135* 151   No results for input(s): LABPROT, INR in the last 72 hours.    Assessment/Plan: - intractable nausea and vomiting. EGD yesterday showed EGD showed LA grade C esophagitis and Mild Gastritis.  - weight loss.CT abdomen pelvis with IV contrast on 08/20/2017 showed focal decreased density adjacent to gallbladder fossa could be a focal fatty  infiltration. MRI was recommended for further evaluation.follow-up MRI showed fatty infiltration. No evidence of hepatic neoplasm. - chronic anemia. - Mild elevated AST.  Recommendations -------------------------- - gastric biopsies negative for H. Pylori. Esophageal biopsies showed Esophageal squamous mucosa shows vague two layers with superficial layer of parakeratotic-appearing squamous cells. This can be sometimes seen in esophagitis superficialis dissecans (sloughing esophagitis ) - recommend repeat EGD in 8 weeks as an outpatient setting. - MRI abdomen with liver protocol showed fatty infiltration. No evidence of neoplasm. - continue soft diet for now - Continue Twice a day PPI and Carafate. - hold off on gastric empty study as she has received Reglan on admission. - GI will sign off. Follow-up with primary GI doctor Schooler  in 4 weeks after discharge. Call us back if needed   Kathi Der MD, FACP 09/21/2017, 10:01 AM  Contact #  [phone]

## 2017-09-21 NOTE — Discharge Instructions (Signed)
Go to your appointment at the [facility] on Tuesday, July 16th at 2:45pm.  Call Dr. Marge Duncans office to make an appointment. [phone]  Pick up your prescriptions at [facility] on [facility].  Remember to try to stay hydrated and keep feeling better!

## 2017-09-22 NOTE — Discharge Summary (Signed)
Family Medicine Teaching [facility] Discharge Summary  Patient name: Kristin [name] Medical record number: [id number] Date of birth: July 03, 1975 Age: 42 y.o. Gender: female Date of Admission: 09/18/2017  Date of Discharge: 09/21/2017 Admitting Physician: Doreene Eland, MD  Primary Care Provider: Marthenia Rolling, DO Consultants: GI  Indication for Hospitalization: Nausea, vomiting, abdominal cramping  Discharge Diagnoses/Problem List:  Nausea and vomiting UTI Pancytopenia: Resolved Peripheral neuropathy Hypertension AKI: improving Bipolar 1 disorder Cigarette smoker Drug Use  Disposition: Home  Discharge Condition: Stable  Discharge Exam: Physical exam: General: 42 year old female in no acute distress, sleeping Cardio: Regular rate and rhythm, no murmurs rubs or gallops          Lungs: Clear to auscultation bilaterally with no wheezes or crackles Abdomen: Soft, epigastric tenderness, positive bowel sounds Extremities: No edema, sensation intact bilateral lower extremities Skin: Warm and dry    Brief Hospital Course:  Kristin [name] is a 42 year old female who presented with complaint of nausea and vomiting.  Her past medical history is significant for hypertension, bipolar disorder, anxiety, obesity, tobacco use, alcohol abuse, knee and back pain.  Nausea and vomiting Kristin [name] presented to the ED with complaints of generalized abdominal pain aching and cramping that she rated a 10 out of 10 in severity that was also associated with nausea and vomiting.  She stated that the symptoms have been occurring for the past 2 months but had worsened significantly in the few days prior to her presentation.  A chart review also revealed a 21 pound weight loss since 06/2017.  She had seen GI for evaluation as an outpatient, was scheduled for an outpatient endoscopy in the following week.  Her prior work-up has been negative, including a right upper quadrant ultrasound and CT scan of  the abdomen that were essentially negative.  GI was consulted in the ED and agreed with the decision to admit the patient for observation and hydration.  The patient was hydrated with IV fluids.  An endoscopy was performed on 7/10 that revealed severe esophagitis.  Biopsies were also taken, and were suggestive of sloughing esophagitis.  GI recommended starting a twice daily PPI and Carafate.  An MRI of the abdomen with liver protocol on 7/11 showed fatty infiltration.  GI noted that they would consider a gastric emptying study at a later time, as the patient had received Reglan during her admission.  It was recommended that she follow-up with Dr. Bosie Clos, GI doctor in 4 weeks after discharge.  The patient's nausea and abdominal pain continued to improve throughout her admission.  Her diet was advanced, which she continued to tolerate.  Patient's clinical symptoms continued to improve, and she was stable at the time of discharge.  UTI Patient's urine culture was found to be positive for E. coli.  Due to complaints of abdominal pain and nausea, decision was made to treat patient's UTI.  Patient was started on a 7-day course of Keflex 500 mg every 12 hours.  Pancytopenia She was slightly leukopenic and anemic throughout her admission.  On 7/11 her platelets decreased to 135.  There is no concern for HIT.  A chart review also revealed a more pronounced thrombocytopenia in the past.  Patient's thrombocytopenia resolved spontaneously, on discharge were 151.  Patient will need a follow-up CBC as an outpatient.  Hypokalemia Patient's potassium was mildly reduced on admission at 3.4.  She was given 40 mEq of K-Dur on 7/9.  Her hypokalemia resolved and on discharge was 10.  Recommend  a follow-up BMP as an outpatient.  AKI On admission patient's creatinine was found to be 1.25, a chart review revealed her baseline was around 0.7.  She noted that she had been taking NSAIDs for pain relief at home, she had also had  decreased oral intake due to her illness.  Was given IV fluids and her creatinine continue to improve.  On discharge her creatinine was 0.98.  Bipolar 1 disorder Upon presentation, the patient was only taking Zoloft.  She had a prescription for Seroquel but had not picked it up due to cost issues.  It was found that Seroquel is on the $9 list at [facility].  Her prescriptions were sent to [facility] and the patient was informed of cost.  She agreed that this would be affordable for her.   Drug use Patient also had a urine drug screen while hospitalized that was positive for cocaine.  Issues for Follow Up:  1. Resolution of UTI.  Patient received a 7-day course of Keflex, ending on 7/17. 2. Follow-up CBC, patient had transient pancytopenia while admitted. 3. Patient placed on folic acid due to low folate levels and chronic peripheral neuropathy. 4. Patient encouraged to start taking her Seroquel prescription, is on the $9 list at [facility].  Please make sure she is taking and tolerating well. 5. Follow-up BMP, patient had hypokalemia corrected with 40 mEq of K-Dur and AKI that was improving at the time of discharge. 6. Will need repeat endoscopy in 8 weeks.  Significant Procedures: Endoscopy with biopsy  Significant Labs and Imaging:  Recent Labs  Lab 09/19/17 0452 09/20/17 0541 09/21/17 0430  WBC 3.4* 3.5* 3.6*  HGB 9.7* 9.3* 9.5*  HCT 29.0* 27.6* 28.2*  PLT 152 135* 151   Recent Labs  Lab 09/18/17 1259 09/18/17 2153 09/19/17 0452 09/20/17 0541 09/21/17 0430  NA 139  --  139 137 139  K 3.4*  --  3.1* 4.3 3.7  CL 99  --  103 108 104  CO2 26  --  24 21* 23  GLUCOSE 102*  --  94 98 88  BUN 7  --  6 <5* <5*  CREATININE 1.25*  --  1.06* 0.99 0.98  CALCIUM 9.6  --  8.4* 8.5* 8.6*  MG  --  1.6*  --   --  1.6*  PHOS  --  3.0  --   --  3.2  ALKPHOS 126  --   --   --   --   AST 80*  --   --   --   --   ALT 15  --   --   --   --   ALBUMIN 3.9  --   --   --   --    Mr Liver W Wo  Contrast  Result Date: 09/21/2017 CLINICAL DATA:  Nausea and vomiting for several weeks. Indeterminate liver lesion on recent CT. EXAM: MRI ABDOMEN WITHOUT AND WITH CONTRAST TECHNIQUE: Multiplanar multisequence MR imaging of the abdomen was performed both before and after the administration of intravenous contrast. CONTRAST:  20mL MULTIHANCE GADOBENATE DIMEGLUMINE 529 MG/ML IV SOLN COMPARISON:  CT on 08/20/2017 FINDINGS: Lower chest: No acute findings. Hepatobiliary: No hepatic masses identified. Diffuse hepatic steatosis is seen on chemical shift imaging. On opposed phase images, more intense signal dropout is seen in the central left lobe adjacent to the porta hepatis, and extending along falciform ligament. This is consistent with focal greater areas of fatty infiltration. Gallbladder is unremarkable. No evidence of  biliary ductal dilatation. Pancreas:  No mass or inflammatory changes. Spleen:  Within normal limits in size and appearance. Adrenals/Urinary Tract: No masses identified. No evidence of hydronephrosis. Stomach/Bowel: Visualized portion unremarkable. Vascular/Lymphatic: No pathologically enlarged lymph nodes identified. No abdominal aortic aneurysm. Other:  None. Musculoskeletal:  No suspicious bone lesions identified. IMPRESSION: Diffuse hepatic steatosis, with more focal area of fatty infiltration in the left hepatic lobe which corresponds to the lesion seen on previous CT. No evidence of hepatic neoplasm or other significant abnormality. Electronically Signed   By: Myles Rosenthal M.D.   On: 09/21/2017 08:06   Results for orders placed or performed during the hospital encounter of 09/18/17  Urine culture     Status: Abnormal   Collection Time: 09/18/17  3:37 PM  Result Value Ref Range Status   Specimen Description URINE, CLEAN CATCH  Final   Special Requests   Final    NONE Performed at [facility], [address] [place], [place] [address]    Culture 80,000 COLONIES/mL ESCHERICHIA  COLI (A)  Final   Report Status 09/20/2017 FINAL  Final   Organism ID, Bacteria ESCHERICHIA COLI (A)  Final      Susceptibility   Escherichia coli - MIC*    AMPICILLIN >=32 RESISTANT Resistant     CEFAZOLIN <=4 SENSITIVE Sensitive     CEFTRIAXONE <=1 SENSITIVE Sensitive     CIPROFLOXACIN <=0.25 SENSITIVE Sensitive     GENTAMICIN >=16 RESISTANT Resistant     IMIPENEM <=0.25 SENSITIVE Sensitive     NITROFURANTOIN <=16 SENSITIVE Sensitive     TRIMETH/SULFA >=320 RESISTANT Resistant     AMPICILLIN/SULBACTAM >=32 RESISTANT Resistant     PIP/TAZO <=4 SENSITIVE Sensitive     Extended ESBL NEGATIVE Sensitive     * 80,000 COLONIES/mL ESCHERICHIA COLI      Results/Tests Pending at Time of Discharge: None  Discharge Medications:  Allergies as of 09/21/2017      Reactions   Sumatriptan Anaphylaxis      Medication List    STOP taking these medications   clonazePAM 0.5 MG tablet Commonly known as:  KLONOPIN   clotrimazole-betamethasone cream Commonly known as:  LOTRISONE   cyclobenzaprine 10 MG tablet Commonly known as:  FLEXERIL   DEXILANT 60 MG capsule Generic drug:  dexlansoprazole   diclofenac 50 MG tablet Commonly known as:  CATAFLAM   metroNIDAZOLE 500 MG tablet Commonly known as:  FLAGYL   naproxen 500 MG tablet Commonly known as:  NAPROSYN   oxyCODONE-acetaminophen 5-325 MG tablet Commonly known as:  PERCOCET/ROXICET   promethazine 25 MG tablet Commonly known as:  PHENERGAN   terbinafine 1 % cream Commonly known as:  LAMISIL   triamcinolone cream 0.1 % Commonly known as:  KENALOG     TAKE these medications   cephALEXin 500 MG capsule Commonly known as:  KEFLEX Take 1 capsule (500 mg total) by mouth every 12 (twelve) hours.   cetirizine 10 MG tablet Commonly known as:  ZYRTEC ALLERGY Take 1 tablet (10 mg total) by mouth daily. What changed:    when to take this  reasons to take this   famotidine 20 MG tablet Commonly known as:  PEPCID Take 1  tablet (20 mg total) by mouth 2 (two) times daily.   fluticasone 50 MCG/ACT nasal spray Commonly known as:  FLONASE Place 2 sprays into both nostrils daily. What changed:    when to take this  reasons to take this   folic acid 1 MG tablet  Commonly known as:  FOLVITE Take 1 tablet (1 mg total) by mouth daily.   gabapentin 300 MG capsule Commonly known as:  NEURONTIN TAKE 1 CAPSULE BY MOUTH AT BEDTIME*MAY TAKE UP TO 3 TIMES DAILY IF NO IMPROVEMENT What changed:  See the new instructions.   lisinopril-hydrochlorothiazide 10-12.5 MG tablet Commonly known as:  PRINZIDE,ZESTORETIC Take 1 tablet by mouth daily.   pantoprazole 40 MG tablet Commonly known as:  PROTONIX Take 1 tablet (40 mg total) by mouth 2 (two) times daily.   QUEtiapine 50 MG tablet Commonly known as:  SEROQUEL Take 1 tablet (50 mg total) by mouth at bedtime for 2 days, THEN 2 tablets (100 mg total) at bedtime for 2 days. Start taking on:  09/21/2017 What changed:  See the new instructions.   QUEtiapine 200 MG tablet Commonly known as:  SEROQUEL Take 1 tablet (200 mg total) by mouth at bedtime. After 50mg  x 2 days and 100mg  x 2 days. What changed:  You were already taking a medication with the same name, and this prescription was added. Make sure you understand how and when to take each.   sertraline 50 MG tablet Commonly known as:  ZOLOFT Take 1 tablet (50 mg total) by mouth daily.   sucralfate 1 GM/10ML suspension Commonly known as:  CARAFATE Take 10 mLs (1 g total) by mouth 4 (four) times daily -  with meals and at bedtime.       Discharge Instructions: Please refer to Patient Instructions section of EMR for full details.  Patient was counseled important signs and symptoms that should prompt return to medical care, changes in medications, dietary instructions, activity restrictions, and follow up appointments.   Follow-Up Appointments: Follow-up Information    Charlott Rakes, MD. Schedule an  appointment as soon as possible for a visit in 4 week(s).   Specialty:  Gastroenterology Why:  follow-up for esophagitis, nausea, vomiting and weight loss Contact information: [address] [place] [address] [phone]        Sandre Kitty, MD. Go to.   Why:  Appointment 7/16 at 2:45 pm Contact information: [address] [place] [address] [phone]           Stephannie Li, DO 09/22/2017, 1:06 PM PGY-1, [facility]

## 2017-09-25 ENCOUNTER — Inpatient Hospital Stay: Payer: BLUE CROSS/BLUE SHIELD | Admitting: Family Medicine

## 2017-10-15 ENCOUNTER — Encounter: Payer: Self-pay | Admitting: Family Medicine

## 2017-10-15 ENCOUNTER — Ambulatory Visit (INDEPENDENT_AMBULATORY_CARE_PROVIDER_SITE_OTHER): Payer: BLUE CROSS/BLUE SHIELD | Admitting: Family Medicine

## 2017-10-15 ENCOUNTER — Other Ambulatory Visit: Payer: Self-pay

## 2017-10-15 VITALS — BP 100/69 | HR 78 | Temp 99.1°F | Wt 180.0 lb

## 2017-10-15 DIAGNOSIS — M5441 Lumbago with sciatica, right side: Secondary | ICD-10-CM

## 2017-10-15 DIAGNOSIS — M5442 Lumbago with sciatica, left side: Secondary | ICD-10-CM

## 2017-10-15 DIAGNOSIS — S99921A Unspecified injury of right foot, initial encounter: Secondary | ICD-10-CM | POA: Diagnosis not present

## 2017-10-15 DIAGNOSIS — K209 Esophagitis, unspecified without bleeding: Secondary | ICD-10-CM

## 2017-10-15 DIAGNOSIS — G8929 Other chronic pain: Secondary | ICD-10-CM

## 2017-10-15 NOTE — Patient Instructions (Addendum)
It was a pleasure to see you today! Thank you for choosing [facility] for your primary care. Kristin [name] was seen for leg pain/"numbness". Come back to the clinic if you have any routine concerns, and go to the emergency room if you have any incontinence, full loss of sensation between your legs or inability to walk.  Also, call your GI doctor [phone] and your neurosurgeon [phone]   Please bring all your medications to every doctors visit   Sign up for My Chart to have easy access to your labs results, and communication with your Primary care physician.     Please check-out at the front desk before leaving the clinic.     Best,  Dr. Marthenia Rolling FAMILY MEDICINE RESIDENT - PGY2 10/15/2017 3:51 PM

## 2017-10-15 NOTE — Progress Notes (Signed)
    Subjective:  Kristin [name] is a 42 y.o. female who presents to the [facility] today with a chief complaint of back pain.   HPI: Back pain Back pain identical to prior complaints after extended time with no injections (she gets epidural steroid injection [place] neurosurg).  Some slowly chronic sensation changes diffusely to legs, not paresthesia but they feel "different, like they are frostbit", no leg weakness, no specific flexion or extension is aggravating, no incontinence, no IV drug use, no saddle paresthesia  GI followup Patient has not followed up with GI s/p hospitalization.   No current vomiting/throat pain and she wants to know if she should followup.  Toe injury Patient "stubbed" toe a week ago, it is still slightly swollen but not discolored.  She thinks she broke it.  Sensation still intact and although it hurts to do so she can walk on it"just fine".  She is taking only OTC pain meds for this and it's almost pain free at this point.  Objective:  Physical Exam: BP 100/69   Pulse 78   Temp 99.1 F (37.3 C) (Oral)   Wt 180 lb (81.6 kg)   SpO2 97%   BMI 30.90 kg/m   Gen: NAD, resting comfortably CV: RRR with no murmurs appreciated Pulm: NWOB, CTAB with no crackles, wheezes, or rhonchi GI: Normal bowel sounds present. Soft, Nontender, Nondistended. MSK: no edema, cyanosis, or clubbing noted.  Patient expressed some pain when activaly standing.  We ambulated ~50feet and she was able to do so reliably although her gait was slower and intentional. Skin: warm, dry Neuro: grossly normal, moves all extremities Psych: Normal affect and thought content  No results found for this or any previous visit (from the past 72 hour(s)).   Assessment/Plan:  Back pain Back pain identical to prior complaints after extended time with no injections (she gets epidural steroid injection [place] neurosurg)  Some slowly chronic sensation changes diffusely to legs, not paresthesia but they  feel "different, like they are frostbit", no leg weakness, no specific flexion or extension is aggravating, no incontinence, no IV drug use, no saddle paresthesia  Patient asked to contact neurosurg and schedule another injection.  Return precautions thoroughly discussed  Esophagitis determined by endoscopy Patient has not followed up with GI s/p hospitalization.   No current vomiting/throat pain and she wants to know if she should followup.  Patient instructed to call GI for followup appt.  Injury of toe on right foot Patient "stubbed" toe a week ago, it is still slightly swollen but not discolored.  She thinks she broke it.  Sensation still intact and although it hurts to do so she can walk on it"just fine"  She declines offer of XR, "all you will do is tell me to take it easy anyway"   Marthenia Rolling, DO FAMILY MEDICINE RESIDENT - PGY2 10/16/2017 9:16 AM

## 2017-10-16 DIAGNOSIS — S99921A Unspecified injury of right foot, initial encounter: Secondary | ICD-10-CM | POA: Insufficient documentation

## 2017-10-16 NOTE — Assessment & Plan Note (Signed)
Back pain identical to prior complaints after extended time with no injections (she gets epidural steroid injection [place] neurosurg)  Some slowly chronic sensation changes diffusely to legs, not paresthesia but they feel "different, like they are frostbit", no leg weakness, no specific flexion or extension is aggravating, no incontinence, no IV drug use, no saddle paresthesia  Patient asked to contact neurosurg and schedule another injection.  Return precautions thoroughly discussed

## 2017-10-16 NOTE — Assessment & Plan Note (Signed)
Patient "stubbed" toe a week ago, it is still slightly swollen but not discolored.  She thinks she broke it.  Sensation still intact and although it hurts to do so she can walk on it"just fine"  She declines offer of XR, "all you will do is tell me to take it easy anyway"

## 2017-10-16 NOTE — Assessment & Plan Note (Signed)
Patient has not followed up with GI s/p hospitalization.   No current vomiting/throat pain and she wants to know if she should followup.  Patient instructed to call GI for followup appt.

## 2017-10-18 ENCOUNTER — Emergency Department (HOSPITAL_COMMUNITY)
Admission: EM | Admit: 2017-10-18 | Discharge: 2017-10-18 | Discharge status: Absent without leave | Payer: BLUE CROSS/BLUE SHIELD

## 2017-10-18 NOTE — ED Notes (Signed)
Pt called to be triaged. Pt asked if she would have to be here for 5 hours. This RN told her we move patients as fast as possible, but there is no way to estimate her length of stay in the department especially before triaging her. Pt stated "I will just leave then"

## 2017-10-25 ENCOUNTER — Telehealth: Payer: Self-pay | Admitting: Family Medicine

## 2017-10-25 NOTE — Telephone Encounter (Signed)
Pt would like a referral to go to [facility]. She said that her stomach is not getting better and she is still loosing weight.jw

## 2017-10-31 NOTE — Telephone Encounter (Signed)
Pt has appt with Kristin [name] for 11/29/17.  She ask about [facility] and I advised that Dr. Parke Simmers wanted her to follow with Kristin [name] first.  Pt is agreeable to plan. Malaak Stach, Maryjo Rochester, CMA

## 2017-11-01 ENCOUNTER — Other Ambulatory Visit: Payer: Self-pay

## 2017-11-01 DIAGNOSIS — M5441 Lumbago with sciatica, right side: Principal | ICD-10-CM

## 2017-11-01 DIAGNOSIS — M5442 Lumbago with sciatica, left side: Principal | ICD-10-CM

## 2017-11-01 DIAGNOSIS — G8929 Other chronic pain: Secondary | ICD-10-CM

## 2017-11-01 MED ORDER — GABAPENTIN 300 MG PO CAPS: ORAL_CAPSULE | ORAL | 0 refills | Status: DC

## 2017-11-08 ENCOUNTER — Encounter: Payer: Self-pay | Admitting: Family Medicine

## 2017-11-08 ENCOUNTER — Ambulatory Visit: Payer: BLUE CROSS/BLUE SHIELD | Admitting: Family Medicine

## 2017-11-08 ENCOUNTER — Other Ambulatory Visit (HOSPITAL_COMMUNITY)
Admission: RE | Admit: 2017-11-08 | Discharge: 2017-11-08 | Disposition: A | Discharge status: Home / Self Care | Payer: BLUE CROSS/BLUE SHIELD | Source: Ambulatory Visit | Attending: Family Medicine | Admitting: Family Medicine

## 2017-11-08 VITALS — BP 119/80 | HR 88 | Temp 98.6°F | Wt 178.2 lb

## 2017-11-08 DIAGNOSIS — F319 Bipolar disorder, unspecified: Secondary | ICD-10-CM | POA: Diagnosis not present

## 2017-11-08 DIAGNOSIS — N898 Other specified noninflammatory disorders of vagina: Secondary | ICD-10-CM

## 2017-11-08 DIAGNOSIS — Z720 Tobacco use: Secondary | ICD-10-CM | POA: Diagnosis not present

## 2017-11-08 DIAGNOSIS — I10 Essential (primary) hypertension: Secondary | ICD-10-CM | POA: Insufficient documentation

## 2017-11-08 DIAGNOSIS — Z6830 Body mass index (BMI) 30.0-30.9, adult: Secondary | ICD-10-CM | POA: Insufficient documentation

## 2017-11-08 DIAGNOSIS — N760 Acute vaginitis: Secondary | ICD-10-CM

## 2017-11-08 DIAGNOSIS — B9689 Other specified bacterial agents as the cause of diseases classified elsewhere: Secondary | ICD-10-CM

## 2017-11-08 DIAGNOSIS — N3000 Acute cystitis without hematuria: Secondary | ICD-10-CM | POA: Insufficient documentation

## 2017-11-08 DIAGNOSIS — E669 Obesity, unspecified: Secondary | ICD-10-CM | POA: Diagnosis not present

## 2017-11-08 LAB — POCT URINALYSIS DIP (MANUAL ENTRY)
BILIRUBIN UA: NEGATIVE mg/dL
Bilirubin, UA: NEGATIVE
Blood, UA: NEGATIVE
GLUCOSE UA: NEGATIVE mg/dL
Nitrite, UA: POSITIVE — AB
Protein Ur, POC: 30 mg/dL — AB
Spec Grav, UA: 1.01 (ref 1.010–1.025)
Urobilinogen, UA: 0.2 E.U./dL
pH, UA: 6 (ref 5.0–8.0)

## 2017-11-08 LAB — POCT WET PREP (WET MOUNT)
CLUE CELLS WET PREP WHIFF POC: POSITIVE
Trichomonas Wet Prep HPF POC: ABSENT

## 2017-11-08 LAB — POCT UA - MICROSCOPIC ONLY

## 2017-11-08 LAB — POCT URINE PREGNANCY: Preg Test, Ur: NEGATIVE

## 2017-11-08 MED ORDER — METRONIDAZOLE 500 MG PO TABS: 500.0000 mg | ORAL_TABLET | Freq: Two times a day (BID) | ORAL | 0 refills | Status: AC

## 2017-11-08 MED ORDER — NITROFURANTOIN MONOHYD MACRO 100 MG PO CAPS: 100.0000 mg | ORAL_CAPSULE | Freq: Two times a day (BID) | ORAL | 0 refills | Status: AC

## 2017-11-08 NOTE — Patient Instructions (Addendum)
Thank you for coming in to see Kristin [name] today. Please see below to review our plan for today's visit.  Your symptoms and urine culture to support a urinary infection.  I have prescribed you an antibiotic called Macrobid which you will take twice daily for 5 days.  Your symptoms should improve over the next 48 hours.  If you develop fevers and chills, vomiting and are unable to take your medication, or flank pain, seek medical attention.  You also have bacterial vaginosis. Take the Flagyl twice daily for 7 days. Do not drink alcohol on this medication.  Please call the clinic at [phone] if your symptoms worsen or you have any concerns. It was our pleasure to serve you.  Durward Parcel, DO [facility], PGY-3

## 2017-11-08 NOTE — Progress Notes (Signed)
   Subjective   Patient ID: Kristin [name]    DOB: [birth year], 42 y.o. female   MRN: [id number]  CC: "Urinary infection"  HPI: Kristin [name] is a 42 y.o. female who presents for a same day appointment for the following:  DYSURIA  Pain urinating started 3 days ago. Pain is: burning Medications tried: none Any antibiotics in the last 30 days: mo More than 3 UTIs in the last 12 months: no STD exposure: no Possibly pregnant: unsure, does not use protection, LMP 1 month ago  Symptoms Urgency: yes Frequency: yes Blood in urine: no Pain in back: no Fever: no Vaginal discharge: some, clear Mouth Ulcers: no  ROS: see HPI for pertinent.  PMFSH: Bipolar, obesity, HTN, tobacco use disorder, GAD.  Surgical history tubal, D&C, endometrial ablation.  Family history HTN.  Smoking status reviewed. Medications reviewed.  Objective   BP 119/80   Pulse 88   Temp 98.6 F (37 C)   Wt 178 lb 3.2 oz (80.8 kg)   SpO2 100%   BMI 30.59 kg/m  Vitals and nursing note reviewed.  General: well nourished, well developed, NAD with non-toxic appearance HEENT: normocephalic, atraumatic, moist mucous membranes Cardiovascular: regular rate and rhythm without murmurs, rubs, or gallops Lungs: clear to auscultation bilaterally with normal work of breathing Abdomen: soft, non-tender, non-distended, normoactive bowel sounds GU: accompanied by chaperone, no external lesions or rashes, no bleeding, mild thin clear to white discharge, cervical os nonfriable, no adnexal tenderness  Assessment & Plan   Acute cystitis without hematuria Acute.  Urinalysis positive for nitrites and leukocytes.  Clinical presentation consistent.  History of Bactrim resistant E. coli.  No signs of kidney involvement.  Afebrile.  Has concomitant BV based on wet prep.  Negative pregnancy test. - Given Macrobid 100 mg twice daily for 5 days with Flagyl 500 mg twice daily for 7 days based on patient's preference for treatment given  vaginal discharge - Urine culture to evaluate sensitivities  Orders Placed This Encounter  Procedures  . Urine Culture  . POCT urinalysis dipstick  . POCT Wet Prep [employer])  . POCT UA - Microscopic Only  . POCT Urine Pregnancy (CPT [phone])   Meds ordered this encounter  Medications  . nitrofurantoin, macrocrystal-monohydrate, (MACROBID) 100 MG capsule    Sig: Take 1 capsule (100 mg total) by mouth 2 (two) times daily for 5 days.    Dispense:  10 capsule    Refill:  0  . metroNIDAZOLE (FLAGYL) 500 MG tablet    Sig: Take 1 tablet (500 mg total) by mouth 2 (two) times daily for 7 days.    Dispense:  14 tablet    Refill:  0    Durward Parcel, DO [facility], PGY-3 11/09/2017, 12:16 PM

## 2017-11-09 DIAGNOSIS — N3000 Acute cystitis without hematuria: Secondary | ICD-10-CM | POA: Insufficient documentation

## 2017-11-09 LAB — CERVICOVAGINAL ANCILLARY ONLY
Chlamydia: NEGATIVE
Neisseria Gonorrhea: NEGATIVE

## 2017-11-09 NOTE — Assessment & Plan Note (Addendum)
Acute.  Urinalysis positive for nitrites and leukocytes.  Clinical presentation consistent.  History of Bactrim resistant E. coli.  No signs of kidney involvement.  Afebrile.  Has concomitant BV based on wet prep.  Negative pregnancy test. - Given Macrobid 100 mg twice daily for 5 days with Flagyl 500 mg twice daily for 7 days based on patient's preference for treatment given vaginal discharge - Urine culture to evaluate sensitivities - Reviewed return precautions

## 2017-11-10 LAB — URINE CULTURE

## 2017-11-13 ENCOUNTER — Encounter: Payer: Self-pay | Admitting: Family Medicine

## 2017-11-18 ENCOUNTER — Other Ambulatory Visit: Payer: Self-pay

## 2017-11-18 ENCOUNTER — Emergency Department (HOSPITAL_COMMUNITY)
Admission: EM | Admit: 2017-11-18 | Discharge: 2017-11-18 | Disposition: A | Discharge status: Home / Self Care | Payer: BLUE CROSS/BLUE SHIELD | Attending: Emergency Medicine | Admitting: Emergency Medicine

## 2017-11-18 ENCOUNTER — Encounter (HOSPITAL_COMMUNITY): Payer: Self-pay | Admitting: Emergency Medicine

## 2017-11-18 ENCOUNTER — Emergency Department (HOSPITAL_COMMUNITY): Payer: BLUE CROSS/BLUE SHIELD

## 2017-11-18 DIAGNOSIS — Y9389 Activity, other specified: Secondary | ICD-10-CM | POA: Diagnosis not present

## 2017-11-18 DIAGNOSIS — S6992XA Unspecified injury of left wrist, hand and finger(s), initial encounter: Secondary | ICD-10-CM | POA: Diagnosis present

## 2017-11-18 DIAGNOSIS — I10 Essential (primary) hypertension: Secondary | ICD-10-CM | POA: Diagnosis not present

## 2017-11-18 DIAGNOSIS — F17210 Nicotine dependence, cigarettes, uncomplicated: Secondary | ICD-10-CM | POA: Diagnosis not present

## 2017-11-18 DIAGNOSIS — S62341A Nondisplaced fracture of base of second metacarpal bone. left hand, initial encounter for closed fracture: Secondary | ICD-10-CM

## 2017-11-18 DIAGNOSIS — Y929 Unspecified place or not applicable: Secondary | ICD-10-CM | POA: Insufficient documentation

## 2017-11-18 DIAGNOSIS — W19XXXA Unspecified fall, initial encounter: Secondary | ICD-10-CM | POA: Insufficient documentation

## 2017-11-18 DIAGNOSIS — Z79899 Other long term (current) drug therapy: Secondary | ICD-10-CM | POA: Insufficient documentation

## 2017-11-18 DIAGNOSIS — Y998 Other external cause status: Secondary | ICD-10-CM | POA: Diagnosis not present

## 2017-11-18 MED ORDER — HYDROCODONE-ACETAMINOPHEN 5-325 MG PO TABS: 1.0000 | ORAL_TABLET | Freq: Four times a day (QID) | ORAL | 0 refills | Status: DC | PRN

## 2017-11-18 MED ORDER — IBUPROFEN 200 MG PO TABS
600.0000 mg | ORAL_TABLET | Freq: Once | ORAL | Status: AC
Start: 2017-11-18 — End: 2017-11-18
  Administered 2017-11-18: 600 mg via ORAL
  Filled 2017-11-18: qty 1

## 2017-11-18 MED ORDER — HYDROCODONE-ACETAMINOPHEN 5-325 MG PO TABS
1.0000 | ORAL_TABLET | Freq: Once | ORAL | Status: AC
  Administered 2017-11-18: 1 via ORAL
  Filled 2017-11-18: qty 1

## 2017-11-18 NOTE — ED Provider Notes (Signed)
[facility] EMERGENCY DEPARTMENT Provider Note   CSN: [id number] Arrival date & time: 11/18/17  1924     History   Chief Complaint Chief Complaint  Patient presents with  . Fall  . Wrist Pain    HPI BILLIJO DILLING is a 42 y.o. female.  HPI   DEZIYA AMERO is a 42 y.o. female, with a history of HTN, presenting to the ED with left hand and wrist pain following a fall last night.  Patient sustained a mechanical fall onto an outstretched hand.  Her pain is moderate to severe, throbbing, nonradiating.  Denies neck/back pain, neuro deficits, LOC, other injuries, or any other complaints.      Past Medical History:  Diagnosis Date  . Anxiety   . Arthritis   . Depression   . Eczema   . Hypertension   . Migraine headache     Patient Active Problem List   Diagnosis Date Noted  . Acute cystitis without hematuria 11/09/2017  . Injury of toe on right foot 10/16/2017  . Esophagitis determined by endoscopy   . Dehydration   . Nausea & vomiting 09/18/2017  . AKI (acute kidney injury) (HCC)   . Bipolar disorder in remission (HCC)   . Back pain 08/25/2016  . Generalized anxiety disorder 08/23/2015  . Knee pain 12/21/2014  . Tinea pedis of both feet 10/02/2013  . Menorrhagia 09/04/2012  . Allergic rhinitis 06/12/2012  . Eczema 03/26/2012  . Alcohol abuse 10/26/2010  . Bipolar 1 disorder (HCC) 10/09/2010  . Panic attacks 10/09/2010  . INSOMNIA, CHRONIC 06/29/2009  . TOBACCO USER 01/23/2009  . Obesity 10/29/2008  . Essential hypertension 12/20/2006    Past Surgical History:  Procedure Laterality Date  . BIOPSY  09/19/2017   Procedure: BIOPSY;  Surgeon: Kathi Der, MD;  Location: [facility];  Service: Gastroenterology;;  . [name] & CURRETTAGE/HYSTROSCOPY WITH HYDROTHERMAL ABLATION N/A 04/04/2017   Procedure: DILATATION & CURETTAGE/HYSTEROSCOPY WITH HYDROTHERMAL ABLATION;  Surgeon: Reva Bores, MD;  Location: [facility];   Service: Gynecology;  Laterality: N/A;  . ESOPHAGOGASTRODUODENOSCOPY (EGD) WITH PROPOFOL N/A 09/19/2017   Procedure: ESOPHAGOGASTRODUODENOSCOPY (EGD) WITH PROPOFOL;  Surgeon: Kathi Der, MD;  Location: [facility];  Service: Gastroenterology;  Laterality: N/A;  . TUBAL LIGATION       OB History    Gravida  4   Para  3   Term  3   Preterm      AB  1   Living  3     SAB      TAB  1   Ectopic      Multiple      Live Births               Home Medications    Prior to Admission medications   Medication Sig Start Date End Date Taking? Authorizing Provider  cetirizine (ZYRTEC ALLERGY) 10 MG tablet Take 1 tablet (10 mg total) by mouth daily. Patient taking differently: Take 10 mg by mouth daily as needed for allergies.  02/27/17   Almon Hercules, MD  famotidine (PEPCID) 20 MG tablet Take 1 tablet (20 mg total) by mouth 2 (two) times daily. 09/21/17   Meccariello, Solmon Ice, DO  fluticasone (FLONASE) 50 MCG/ACT nasal spray Place 2 sprays into both nostrils daily. Patient taking differently: Place 2 sprays into both nostrils daily as needed for allergies.  02/25/17   Cathie Hoops, Amy V, PA-C  folic acid (FOLVITE) 1 MG tablet Take 1  tablet (1 mg total) by mouth daily. 09/22/17   Meccariello, Solmon Ice, DO  gabapentin (NEURONTIN) 300 MG capsule TAKE 1 CAPSULE BY MOUTH AT BEDTIME*MAY TAKE UP TO 3 TIMES DAILY IF NO IMPROVEMENT 11/01/17   Marthenia Rolling, DO  lisinopril-hydrochlorothiazide (PRINZIDE,ZESTORETIC) 10-12.5 MG tablet Take 1 tablet by mouth daily. 09/19/17   Marthenia Rolling, DO  pantoprazole (PROTONIX) 40 MG tablet Take 1 tablet (40 mg total) by mouth 2 (two) times daily. 09/21/17   Meccariello, Solmon Ice, DO  QUEtiapine (SEROQUEL) 200 MG tablet Take 1 tablet (200 mg total) by mouth at bedtime. After 50mg  x 2 days and 100mg  x 2 days. 09/21/17   Meccariello, Solmon Ice, DO  sertraline (ZOLOFT) 50 MG tablet Take 1 tablet (50 mg total) by mouth daily. 06/14/17   Almon Hercules, MD    Family  History Family History  Problem Relation Age of Onset  . Cancer Maternal Grandmother        Breast  . Hypertension Mother   . Hypertension Brother   . Cancer Maternal Uncle        colon    Social History Social History   Tobacco Use  . Smoking status: Current Every Day Smoker    Packs/day: 0.25    Years: 5.00    Pack years: 1.25  . Smokeless tobacco: Never Used  Substance Use Topics  . Alcohol use: Yes    Comment: occassionally   . Drug use: No     Allergies   Sumatriptan   Review of Systems Review of Systems  Musculoskeletal: Positive for arthralgias.  Neurological: Negative for weakness and numbness.     Physical Exam Updated Vital Signs BP 102/69 (BP Location: Right Arm)   Pulse 78   Temp 98.4 F (36.9 C) (Oral)   Resp 16   LMP 10/19/2017   SpO2 100%   Physical Exam  Constitutional: She appears well-developed and well-nourished. No distress.  HENT:  Head: Normocephalic and atraumatic.  Eyes: Conjunctivae are normal.  Neck: Neck supple.  Cardiovascular: Normal rate, regular rhythm and intact distal pulses.  Pulmonary/Chest: Effort normal.  Musculoskeletal: She exhibits edema and tenderness. She exhibits no deformity.  Tenderness across the dorsum of the left hand with questionable swelling.  Tenderness extends into the anatomical snuffbox.  No noted deformity or crepitus. No tenderness across the rest of the wrist.  Range of motion intact through the cardinal directions of the left wrist.  Neurological: She is alert.  Sensation grossly intact to light touch through each of the nerve distributions of the bilateral upper extremities. Abduction and adduction of the fingers intact against resistance. Grip strength somewhat weaker on the left, possibly due to pain. Supination and pronation intact against resistance. Strength 5/5 through the cardinal directions of the bilateral wrists. Strength 5/5 with flexion and extension of the bilateral  elbows. Patient can touch the thumb to each one of the fingertips without difficulty.  Skin: Skin is warm and dry. Capillary refill takes less than 2 seconds. She is not diaphoretic. No pallor.  Psychiatric: She has a normal mood and affect. Her behavior is normal.  Nursing note and vitals reviewed.    ED Treatments / Results  Labs (all labs ordered are listed, but only abnormal results are displayed) Labs Reviewed - No data to display  EKG None  Radiology Dg Wrist Complete Left  Result Date: 11/18/2017 CLINICAL DATA:  Left hand and wrist pain after a fall last night. Initial encounter. EXAM: LEFT WRIST - COMPLETE  3+ VIEW COMPARISON:  None. FINDINGS: There is a horizontal, nondisplaced fracture of the second metacarpal at the junction of the base and shaft. Nondisplaced fractures are also questioned through the base of the third and fourth metacarpals. There is no dislocation. Soft tissue swelling is noted throughout the hand. IMPRESSION: 1. Nondisplaced proximal second metacarpal fracture. 2. Possible nondisplaced proximal third and fourth metacarpal fractures. Electronically Signed   By: Sebastian Ache M.D.   On: 11/18/2017 20:31   Dg Hand Complete Left  Result Date: 11/18/2017 CLINICAL DATA:  Left hand and wrist pain after a fall last night. Initial encounter. EXAM: LEFT HAND - COMPLETE 3+ VIEW COMPARISON:  None. FINDINGS: There is a horizontal, nondisplaced fracture of the second metacarpal at the junction of the base and shaft. Nondisplaced fractures are also questioned through the base of the third and fourth metacarpals. There is no dislocation. Soft tissue swelling is noted throughout the hand. IMPRESSION: 1. Nondisplaced proximal second metacarpal fracture. 2. Possible nondisplaced proximal third and fourth metacarpal fractures. Electronically Signed   By: Sebastian Ache M.D.   On: 11/18/2017 20:31    Procedures Procedures (including critical care time)  Medications Ordered in  ED Medications  ibuprofen (ADVIL,MOTRIN) tablet 600 mg (600 mg Oral Given 11/18/17 1958)  HYDROcodone-acetaminophen (NORCO/VICODIN) 5-325 MG per tablet 1 tablet (1 tablet Oral Given 11/18/17 1959)     Initial Impression / Assessment and Plan / ED Course  I have reviewed the triage vital signs and the nursing notes.  Pertinent labs & imaging results that were available during my care of the patient were reviewed by me and considered in my medical decision making (see chart for details).     Patient presents with left hand pain following a fall that occurred last night.  Proximal second metacarpal fracture noted on x-ray.  Patient appears to be neurovascularly intact.  Placed in a sugar tong splint and given instructions for hand surgery follow-up. The patient was given instructions for home care as well as return precautions. Patient voices understanding of these instructions, accepts the plan, and is comfortable with discharge.  Findings and plan of care discussed with Linwood Dibbles, MD.   Final Clinical Impressions(s) / ED Diagnoses   Final diagnoses:  Fall, initial encounter  Closed nondisplaced fracture of base of second metacarpal bone of left hand, initial encounter    ED Discharge Orders    None       Concepcion Living 11/18/17 2055    Linwood Dibbles, MD 11/20/17 [phone]

## 2017-11-18 NOTE — ED Triage Notes (Signed)
Pt states she tripped and fell last night.  C/o pain to L wrist.

## 2017-11-18 NOTE — Progress Notes (Signed)
Orthopedic Tech Progress Note Patient Details:  Kristin [name] [birth year] [id number]  Ortho Devices Type of Ortho Device: Arm sling, Sugartong splint Ortho Device/Splint Location: lue Ortho Device/Splint Interventions: Ordered, Application, Adjustment   Post Interventions Patient Tolerated: Well Instructions Provided: Care of device, Adjustment of device   Trinna Post 11/18/2017, 9:33 PM

## 2017-11-18 NOTE — Discharge Instructions (Addendum)
There was evidence of at least one fracture in the left hand with the possibility of 2 more also noted. Antiinflammatory medications: Take 600 mg of ibuprofen every 6 hours or 440 mg (over the counter dose) to 500 mg (prescription dose) of naproxen every 12 hours for the next 3 days. After this time, these medications may be used as needed for pain. Take these medications with food to avoid upset stomach. Choose only one of these medications, do not take them together. Acetaminophen (generic for Tylenol): Should you continue to have additional pain while taking the ibuprofen or naproxen, you may add in acetaminophen as needed. Your daily total maximum amount of acetaminophen from all sources should be limited to 4000mg /day for persons without liver problems, or 2000mg /day for those with liver problems. Vicodin: May take Vicodin (hydrocodone-acetaminophen) as needed for severe pain.  Do not drive or perform other dangerous activities while taking the Vicodin.  Please note that each pill of Vicodin contains 325 mg of acetaminophen (Tylenol) and the above dosage limits apply. Ice: May apply ice to the area over the next 24 hours for 15 minutes at a time to reduce swelling. Elevation: Keep the extremity elevated as often as possible to reduce pain and inflammation. Support: Keep the splint clean and dry. Follow up: Follow-up with the hand specialist as soon as possible for management of this fracture.  This should occur within a week or two. Return: Return to the ED for numbness, weakness, increasing pain, overall worsening symptoms, loss of function, or you have tried to follow up with the orthopedic specialist and have been unable to do so.

## 2017-12-04 ENCOUNTER — Ambulatory Visit (INDEPENDENT_AMBULATORY_CARE_PROVIDER_SITE_OTHER): Payer: BLUE CROSS/BLUE SHIELD

## 2017-12-04 DIAGNOSIS — Z111 Encounter for screening for respiratory tuberculosis: Secondary | ICD-10-CM

## 2017-12-04 NOTE — Progress Notes (Signed)
Pt presents in nurse clinic for PPD placement. Tuberculin skin test placed on right forearm, wheel present. Pt scheduled for nurse visit 9/26 @230pm  for read. Reminder card given.

## 2017-12-05 ENCOUNTER — Other Ambulatory Visit (HOSPITAL_COMMUNITY): Payer: Self-pay | Admitting: *Deleted

## 2017-12-05 ENCOUNTER — Ambulatory Visit (HOSPITAL_COMMUNITY): Payer: BLUE CROSS/BLUE SHIELD | Admitting: Psychiatry

## 2017-12-05 ENCOUNTER — Encounter (HOSPITAL_COMMUNITY): Payer: Self-pay | Admitting: Psychiatry

## 2017-12-05 VITALS — BP 135/81 | HR 83 | Ht 64.5 in | Wt 180.0 lb

## 2017-12-05 DIAGNOSIS — F319 Bipolar disorder, unspecified: Secondary | ICD-10-CM | POA: Diagnosis not present

## 2017-12-05 DIAGNOSIS — R454 Irritability and anger: Secondary | ICD-10-CM | POA: Diagnosis not present

## 2017-12-05 DIAGNOSIS — F17210 Nicotine dependence, cigarettes, uncomplicated: Secondary | ICD-10-CM

## 2017-12-05 DIAGNOSIS — G4719 Other hypersomnia: Secondary | ICD-10-CM

## 2017-12-05 DIAGNOSIS — F5104 Psychophysiologic insomnia: Secondary | ICD-10-CM | POA: Diagnosis not present

## 2017-12-05 DIAGNOSIS — Z79899 Other long term (current) drug therapy: Secondary | ICD-10-CM

## 2017-12-05 DIAGNOSIS — R0683 Snoring: Secondary | ICD-10-CM

## 2017-12-05 DIAGNOSIS — F410 Panic disorder [episodic paroxysmal anxiety] without agoraphobia: Secondary | ICD-10-CM | POA: Diagnosis not present

## 2017-12-05 DIAGNOSIS — Z818 Family history of other mental and behavioral disorders: Secondary | ICD-10-CM

## 2017-12-05 MED ORDER — QUETIAPINE FUMARATE ER 50 MG PO TB24: ORAL_TABLET | ORAL | 2 refills | Status: DC

## 2017-12-05 MED ORDER — SERTRALINE HCL 100 MG PO TABS: ORAL_TABLET | ORAL | 0 refills | Status: DC
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
## 2017-12-05 NOTE — Telephone Encounter (Signed)
S.Maurer, MD asked that refill be sent in for Zoloft and Seroquel as they had been printed by accident. Submitted orders to patient pharmacy.

## 2017-12-06 ENCOUNTER — Ambulatory Visit: Payer: BLUE CROSS/BLUE SHIELD

## 2017-12-06 DIAGNOSIS — Z111 Encounter for screening for respiratory tuberculosis: Secondary | ICD-10-CM

## 2017-12-06 LAB — TB SKIN TEST
INDURATION: 0 mm
TB Skin Test: NEGATIVE

## 2017-12-06 NOTE — Progress Notes (Signed)
Pt presents in nurse clinic for PPD site read.  PPD read and results entered into epic.  Result: 45mm Interpretation: Negative   Letter generated and given to her for her employer

## 2018-01-08 ENCOUNTER — Ambulatory Visit: Payer: BLUE CROSS/BLUE SHIELD | Admitting: Family Medicine

## 2018-01-10 ENCOUNTER — Ambulatory Visit (HOSPITAL_COMMUNITY): Payer: BLUE CROSS/BLUE SHIELD | Admitting: Psychiatry

## 2018-01-22 ENCOUNTER — Other Ambulatory Visit: Payer: Self-pay | Admitting: Family Medicine

## 2018-01-22 DIAGNOSIS — G8929 Other chronic pain: Secondary | ICD-10-CM

## 2018-01-22 DIAGNOSIS — M5442 Lumbago with sciatica, left side: Principal | ICD-10-CM

## 2018-01-22 DIAGNOSIS — M5441 Lumbago with sciatica, right side: Principal | ICD-10-CM

## 2018-02-01 ENCOUNTER — Ambulatory Visit: Payer: BLUE CROSS/BLUE SHIELD | Admitting: Family Medicine

## 2018-02-21 ENCOUNTER — Ambulatory Visit: Payer: BLUE CROSS/BLUE SHIELD | Admitting: Family Medicine

## 2018-02-21 ENCOUNTER — Other Ambulatory Visit: Payer: Self-pay

## 2018-02-21 VITALS — BP 142/100 | HR 73 | Temp 98.2°F | Ht 64.0 in | Wt 176.8 lb

## 2018-02-21 DIAGNOSIS — Z3202 Encounter for pregnancy test, result negative: Secondary | ICD-10-CM

## 2018-02-21 DIAGNOSIS — R350 Frequency of micturition: Secondary | ICD-10-CM

## 2018-02-21 LAB — POCT UA - MICROSCOPIC ONLY

## 2018-02-21 LAB — POCT URINALYSIS DIP (MANUAL ENTRY)
BILIRUBIN UA: NEGATIVE
Glucose, UA: NEGATIVE mg/dL
NITRITE UA: POSITIVE — AB
PH UA: 6 (ref 5.0–8.0)
SPEC GRAV UA: 1.015 (ref 1.010–1.025)
Urobilinogen, UA: 1 E.U./dL

## 2018-02-21 LAB — POCT URINE PREGNANCY: PREG TEST UR: NEGATIVE

## 2018-02-21 MED ORDER — CEPHALEXIN 500 MG PO CAPS: 500.0000 mg | ORAL_CAPSULE | Freq: Four times a day (QID) | ORAL | 0 refills | Status: AC

## 2018-02-21 NOTE — Patient Instructions (Signed)
It was a pleasure to see you today! Thank you for choosing [facility] for your primary care. Kristin [name] was seen for UTI.   Our plans for today were:  I sent the antibiotic to your pharmacy. Call us if things aren't better in the next 2 days.   Please take your blood pressure medicine tomorrow and see Dr. Parke Simmers soon.   Best,  Dr. Chanetta Marshall

## 2018-02-21 NOTE — Progress Notes (Signed)
   CC: UTI?  HPI  Dysuria - more frequency than before. Also has back pain, does report she had a hx of slipped disk. No vaginal discharge. States urine smells like eggs. Has some pulling sensation when she pees. Feels similar to episode in august. No fever. Occasional sweats, thinks she may be having hot flashes.   BP - hasn't taken bp meds x 2 days because she has to  Urinate more with that medicine anyways.   ROS: Denies CP, SOB, abdominal pain, dysuria, changes in BMs.   CC, SH/smoking status, and VS noted  Objective: BP (!) 142/100   Pulse 73   Temp 98.2 F (36.8 C) (Oral)   Ht 5\' 4"  (1.626 m)   Wt 176 lb 12.8 oz (80.2 kg)   SpO2 99%   BMI 30.35 kg/m  Gen: NAD, alert, cooperative, and pleasant. HEENT: NCAT, EOMI, PERRL CV: RRR, no murmur Resp: CTAB, no wheezes, non-labored Abd: soft, mildly TTP over suprapubic region. MSK tenderness to bilateral lumbar aspects, +CVAT, no spinous tenderness.  Ext: No edema, warm Neuro: Alert and oriented, Speech clear, No gross deficits  Assessment and plan:  UTI: Given patient's chronic back pain, is difficult to appreciate whether this is MSK pain versus CVA tenderness.  Will treat as complicated UTI with increased frequency and duration of Keflex.  Previous urine culture was nearly pansensitive E. Coli, sensitive to ceftriaxone.  Keflex 500 4 times daily x7 days.  Return if worsening or no improvement.  Hypertension: Blood pressure elevated today, patient has not been taking her blood pressure meds due to frequency of urination.  Instructed her to take these tomorrow and return in 1 month to recheck with PCP.  Orders Placed This Encounter  Procedures  . POCT urinalysis dipstick  . POCT urine pregnancy  . POCT UA - Microscopic Only    No orders of the defined types were placed in this encounter.   Loni Muse, MD, PGY3 02/21/2018 3:26 PM

## 2018-02-26 LAB — URINE CULTURE

## 2018-02-28 ENCOUNTER — Other Ambulatory Visit: Payer: Self-pay | Admitting: Family Medicine

## 2018-02-28 ENCOUNTER — Telehealth: Payer: Self-pay | Admitting: *Deleted

## 2018-02-28 DIAGNOSIS — M5442 Lumbago with sciatica, left side: Principal | ICD-10-CM

## 2018-02-28 DIAGNOSIS — G8929 Other chronic pain: Secondary | ICD-10-CM

## 2018-02-28 DIAGNOSIS — M5441 Lumbago with sciatica, right side: Principal | ICD-10-CM

## 2018-02-28 MED ORDER — FLUCONAZOLE 150 MG PO TABS: 150.0000 mg | ORAL_TABLET | Freq: Once | ORAL | 0 refills | Status: AC

## 2018-02-28 NOTE — Telephone Encounter (Signed)
Called patient, she states she gets yeast infection with abx in the past. She is feeling some better than before antibiotics. Endorses chronic back pain.

## 2018-02-28 NOTE — Telephone Encounter (Signed)
Patient called again, is especially concerned about getting yeast treatment.  Ples Specter, RN [facility] RN)

## 2018-02-28 NOTE — Telephone Encounter (Signed)
I asked patient to call tomorrow to make an appt to be seen if she continues to have back pain - we can continue to consider pyelo although she has been adequately treated according to the culture.

## 2018-02-28 NOTE — Telephone Encounter (Signed)
Contacted pt and she first said it wasn't getting better and then she stated that she believes she has a yeast infection and would like something to treat this.  She stated that her back still hurts but she said she is still going a lot but not as much as she was, told her that doctor was waiting to see which antibiotic would be best.  Instructed her to continue with medicine unless she hears back from [place]. Lamonte Sakai, April D, [place]

## 2018-02-28 NOTE — Telephone Encounter (Signed)
-----   Message from Garth Bigness, MD sent at 02/26/2018 10:03 AM EST ----- [facility] team, please let patient know that her urine culture looks to be growing the same bacteria as before (it is not uncommon for the same bacteria to become a problem again). This means the antibiotic should have gotten her better, please ask her if she is feeling better. If she worsens or does not improve we need to see her again. I'm waiting for the results of exactly which antibiotics would be best for the particular bacteria, but based on last visit the antibiotic I sent her should be covering well. I will call her if anything changes.

## 2018-03-07 ENCOUNTER — Encounter: Payer: Self-pay | Admitting: Family Medicine

## 2018-03-07 ENCOUNTER — Other Ambulatory Visit (HOSPITAL_COMMUNITY)
Admission: RE | Admit: 2018-03-07 | Discharge: 2018-03-07 | Disposition: A | Discharge status: Home / Self Care | Payer: BLUE CROSS/BLUE SHIELD | Source: Ambulatory Visit | Attending: Family Medicine | Admitting: Family Medicine

## 2018-03-07 ENCOUNTER — Ambulatory Visit (INDEPENDENT_AMBULATORY_CARE_PROVIDER_SITE_OTHER): Payer: BLUE CROSS/BLUE SHIELD | Admitting: Family Medicine

## 2018-03-07 ENCOUNTER — Other Ambulatory Visit: Payer: Self-pay

## 2018-03-07 VITALS — BP 104/62 | HR 73 | Temp 97.9°F | Ht 64.0 in | Wt 171.2 lb

## 2018-03-07 DIAGNOSIS — N898 Other specified noninflammatory disorders of vagina: Secondary | ICD-10-CM | POA: Diagnosis not present

## 2018-03-07 DIAGNOSIS — R300 Dysuria: Secondary | ICD-10-CM | POA: Diagnosis not present

## 2018-03-07 LAB — POCT URINALYSIS DIP (MANUAL ENTRY)
Bilirubin, UA: NEGATIVE
Blood, UA: NEGATIVE
Glucose, UA: NEGATIVE mg/dL
Ketones, POC UA: NEGATIVE mg/dL
Nitrite, UA: NEGATIVE
PROTEIN UA: NEGATIVE mg/dL
Spec Grav, UA: <=1.005 — AB (ref 1.010–1.025)
Urobilinogen, UA: 0.2 E.U./dL
pH, UA: 6.5 (ref 5.0–8.0)

## 2018-03-07 LAB — POCT UA - MICROSCOPIC ONLY

## 2018-03-07 LAB — POCT WET PREP (WET MOUNT)
Clue Cells Wet Prep Whiff POC: POSITIVE
TRICHOMONAS WET PREP HPF POC: ABSENT

## 2018-03-07 MED ORDER — FLUCONAZOLE 150 MG PO TABS: 150.0000 mg | ORAL_TABLET | Freq: Once | ORAL | 0 refills | Status: AC

## 2018-03-07 MED ORDER — METRONIDAZOLE 500 MG PO TABS: 500.0000 mg | ORAL_TABLET | Freq: Two times a day (BID) | ORAL | 0 refills | Status: DC

## 2018-03-07 NOTE — Patient Instructions (Signed)
It was nice meeting you today Kristin [name]!  Your test showed that you have bacterial vaginosis and a yeast infection.  For your bacterial vaginosis, please take the antibiotic metronidazole 2 times per day for 1 week.  Please do not drink alcohol while taking this medication because it will make you feel very sick.  For your yeast infection, I am giving you 2 pills of Diflucan.  Please take the second pill 2 to 3 days after the first pill.  Please let us know if your symptoms do not improve within the week.  If you have any questions or concerns, please feel free to call the clinic.   Be well,  Dr. Frances Furbish

## 2018-03-07 NOTE — Assessment & Plan Note (Addendum)
Wet prep positive for yeast and clue cells.  GC and Chlamydia were also collected due to recent sexual activity.  Patient given a prescription for metronidazole 500 mg twice daily for 7 days and Diflucan 150 mg twice.  Patient counseled not to consume any alcohol while taking metronidazole.

## 2018-03-07 NOTE — Assessment & Plan Note (Signed)
UA positive for leukocytes but negative for nitrites.  Nitrites were positive the last 2 times her urine has been tested, so a negative result today argues against another UTI.  Patient was also given the appropriate antibiotic according to the sensitivities of her culture on 12/12, so the organism should have been properly treated.  Will send her urine today for culture and treat if a bacteria is isolated.  We will also treat if her symptoms do not resolve with her treatment for BV and yeast.

## 2018-03-07 NOTE — Progress Notes (Addendum)
   Subjective:    Kristin [name] - 42 y.o. female MRN [id number]  Date of birth: [birth year]  CC:  Kristin [name] is here for vaginal discharge and pelvic heaviness during urination.  HPI: Was treated with Keflex for a UTI on 02/21/18, and her malodorous urine improved; however, she continues to have pressure in her pelvis during urination.  She also reports urinary frequency.  She developed vulvar itching a few days after starting Keflex and was prescribed one tablet of Diflucan.  She denies any changes her vaginal discharge.  She does not think this is due to a sexually transmitted infection, but she has been sexually active recently.  Health Maintenance:  There are no preventive care reminders to display for this patient.  -  reports that she has been smoking. She has a 1.25 pack-year smoking history. She has never used smokeless tobacco. - Review of Systems: Per HPI. - Past Medical History: Patient Active Problem List   Diagnosis Date Noted  . Vaginal irritation 03/07/2018  . Acute cystitis without hematuria 11/09/2017  . Injury of toe on right foot 10/16/2017  . Esophagitis determined by endoscopy   . Dehydration   . Nausea & vomiting 09/18/2017  . AKI (acute kidney injury) (HCC)   . Bipolar disorder in remission (HCC)   . Back pain 08/25/2016  . Generalized anxiety disorder 08/23/2015  . Knee pain 12/21/2014  . Dysuria 04/22/2014  . Tinea pedis of both feet 10/02/2013  . Menorrhagia 09/04/2012  . Allergic rhinitis 06/12/2012  . Eczema 03/26/2012  . Alcohol abuse 10/26/2010  . Bipolar 1 disorder (HCC) 10/09/2010  . Panic attacks 10/09/2010  . INSOMNIA, CHRONIC 06/29/2009  . TOBACCO USER 01/23/2009  . Obesity 10/29/2008  . Essential hypertension 12/20/2006   - Medications: reviewed and updated   Objective:   Physical Exam BP 104/62   Pulse 73   Temp 97.9 F (36.6 C) (Oral)   Ht 5\' 4"  (1.626 m)   Wt 171 lb 3.2 oz (77.7 kg)   SpO2 97%   BMI 29.39 kg/m  Gen:  NAD, alert, cooperative with exam, well-appearing GU: Normal-appearing vulva, vagina, cervix without abnormal vaginal discharge or cervical lesions.        Assessment & Plan:   Vaginal irritation Wet prep positive for yeast and clue cells.  GC and Chlamydia were also collected due to recent sexual activity.  Patient given a prescription for metronidazole 500 mg twice daily for 7 days and Diflucan 150 mg twice.  Patient counseled not to consume any alcohol while taking metronidazole.  Dysuria UA positive for leukocytes but negative for nitrites.  Nitrites were positive the last 2 times her urine has been tested, so a negative result today argues against another UTI.  Patient was also given the appropriate antibiotic according to the sensitivities of her culture on 12/12, so the organism should have been properly treated.  Will send her urine today for culture and treat if a bacteria is isolated.  We will also treat if her symptoms do not resolve with her treatment for BV and yeast.    Lezlie Octave, M.D. 03/08/2018, 12:18 PM PGY-2, [facility]

## 2018-03-11 LAB — CERVICOVAGINAL ANCILLARY ONLY
Chlamydia: NEGATIVE
Neisseria Gonorrhea: NEGATIVE

## 2018-03-12 ENCOUNTER — Ambulatory Visit (INDEPENDENT_AMBULATORY_CARE_PROVIDER_SITE_OTHER): Payer: BLUE CROSS/BLUE SHIELD | Admitting: Psychiatry

## 2018-03-12 ENCOUNTER — Encounter: Payer: Self-pay | Admitting: Family Medicine

## 2018-03-12 ENCOUNTER — Encounter (HOSPITAL_COMMUNITY): Payer: Self-pay | Admitting: Psychiatry

## 2018-03-12 VITALS — BP 122/80 | Ht 64.5 in | Wt 170.0 lb

## 2018-03-12 DIAGNOSIS — F411 Generalized anxiety disorder: Secondary | ICD-10-CM

## 2018-03-12 DIAGNOSIS — F319 Bipolar disorder, unspecified: Secondary | ICD-10-CM

## 2018-03-12 MED ORDER — LAMOTRIGINE 25 MG PO TABS: ORAL_TABLET | ORAL | 1 refills | Status: DC

## 2018-03-12 MED ORDER — QUETIAPINE FUMARATE ER 50 MG PO TB24: ORAL_TABLET | ORAL | 1 refills | Status: DC
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
## 2018-03-15 ENCOUNTER — Encounter: Payer: Self-pay | Admitting: *Deleted

## 2018-03-19 ENCOUNTER — Encounter: Payer: Self-pay | Admitting: Family Medicine

## 2018-03-19 ENCOUNTER — Other Ambulatory Visit: Payer: Self-pay

## 2018-03-19 ENCOUNTER — Ambulatory Visit (INDEPENDENT_AMBULATORY_CARE_PROVIDER_SITE_OTHER): Payer: BLUE CROSS/BLUE SHIELD | Admitting: Family Medicine

## 2018-03-19 VITALS — BP 112/80 | HR 77 | Temp 98.5°F | Ht 64.0 in | Wt 171.6 lb

## 2018-03-19 DIAGNOSIS — R300 Dysuria: Secondary | ICD-10-CM

## 2018-03-19 DIAGNOSIS — R102 Pelvic and perineal pain unspecified side: Secondary | ICD-10-CM

## 2018-03-19 DIAGNOSIS — N200 Calculus of kidney: Secondary | ICD-10-CM

## 2018-03-19 DIAGNOSIS — N390 Urinary tract infection, site not specified: Secondary | ICD-10-CM

## 2018-03-19 DIAGNOSIS — R109 Unspecified abdominal pain: Secondary | ICD-10-CM

## 2018-03-19 LAB — POCT URINALYSIS DIP (MANUAL ENTRY)
Bilirubin, UA: NEGATIVE
Glucose, UA: NEGATIVE mg/dL
Ketones, POC UA: NEGATIVE mg/dL
Nitrite, UA: POSITIVE — AB
Protein Ur, POC: NEGATIVE mg/dL
Spec Grav, UA: <=1.005 — AB (ref 1.010–1.025)
Urobilinogen, UA: 0.2 E.U./dL
pH, UA: 6 (ref 5.0–8.0)

## 2018-03-19 LAB — POCT UA - MICROSCOPIC ONLY

## 2018-03-19 MED ORDER — SULFAMETHOXAZOLE-TRIMETHOPRIM 800-160 MG PO TABS: 1.0000 | ORAL_TABLET | Freq: Two times a day (BID) | ORAL | 0 refills | Status: DC

## 2018-03-19 MED ORDER — HYDROCODONE-ACETAMINOPHEN 10-325 MG PO TABS: 1.0000 | ORAL_TABLET | Freq: Four times a day (QID) | ORAL | 0 refills | Status: DC | PRN

## 2018-03-19 NOTE — Progress Notes (Signed)
Subjective:    Kristin [name] is a 43 y.o. female who presents to [facility] today for ongoing pelvic pressure and dysuria:  1.  Pelvic pressure: Present now for almost a month.  She has had ongoing frequent urination, intermittent dysuria although none currently, nocturia 1-3 times a night.  This is new for the past month.  She has never had a kidney stone.  She is ongoing pelvic pressure.  She had 2 documented urinary tract infections with positive E. coli in her culture.  She was first treated with Macrobid and switched to Keflex without relief.  No fevers or chills.  No nausea or vomiting.  She has had some mild to moderate bilateral low thoracic/upper lumbar back pain for the past week or so.  No real improvement with over-the-counter analgesics.  No abdominal pain.  No radiation to her lower legs or her back pain.  No falls or other mechanical injury.  She has no personal history of kidney stones.   ROS as above per HPI.    The following portions of the patient's history were reviewed and updated as appropriate: allergies, current medications, past medical history, family and social history, and problem list. Patient is a nonsmoker.    PMH reviewed.  Past Medical History:  Diagnosis Date  . Anxiety   . Arthritis   . Depression   . Eczema   . Hypertension   . Migraine headache    Past Surgical History:  Procedure Laterality Date  . BIOPSY  09/19/2017   Procedure: BIOPSY;  Surgeon: Kathi Der, MD;  [facility]: [facility];  Service: Gastroenterology;;  . [name] & CURRETTAGE/HYSTROSCOPY WITH HYDROTHERMAL ABLATION N/A 04/04/2017   Procedure: DILATATION & CURETTAGE/HYSTEROSCOPY WITH HYDROTHERMAL ABLATION;  Surgeon: Reva Bores, MD;  Location: [facility];  Service: Gynecology;  Laterality: N/A;  . ESOPHAGOGASTRODUODENOSCOPY (EGD) WITH PROPOFOL N/A 09/19/2017   Procedure: ESOPHAGOGASTRODUODENOSCOPY (EGD) WITH PROPOFOL;  Surgeon: Kathi Der, MD;  Location: [facility];  Service: Gastroenterology;  Laterality: N/A;  . TUBAL LIGATION      Medications reviewed. Current Outpatient Medications  Medication Sig Dispense Refill  . fluticasone (FLONASE) 50 MCG/ACT nasal spray Place 2 sprays into both nostrils daily. (Patient taking differently: Place 2 sprays into both nostrils daily as needed for allergies. ) 1 g 0  . folic acid (FOLVITE) 1 MG tablet Take 1 tablet (1 mg total) by mouth daily. 30 tablet 0  . gabapentin (NEURONTIN) 300 MG capsule TAKE 1 CAPSULE BY MOUTH AT BEDTIME. MAY TAKE UP TO 3 TIMES DAILY IF NO IMPROVEMENT 90 capsule 0  . lamoTRIgine (LAMICTAL) 25 MG tablet Take one tab daily for 1 week and than 2 tab daily 60 tablet 1  . lisinopril-hydrochlorothiazide (PRINZIDE,ZESTORETIC) 10-12.5 MG tablet Take 1 tablet by mouth daily. 90 tablet 3  . QUEtiapine (SEROQUEL XR) 50 MG TB24 24 hr tablet Take 50 mg at bedtime.  May repeat after 30-60 minutes as needed to achieve sleep 60 tablet 1  . sertraline (ZOLOFT) 100 MG tablet Take 100 mg (1 tablet) daily for one week, and then increase to 200 mg (2 tablets) daily Take in the morning. 180 tablet 0   No current facility-administered medications for this visit.      Objective:   Physical Exam BP 112/80   Pulse 77   Temp 98.5 F (36.9 C) (Oral)   Ht 5\' 4"  (1.626 m)   Wt 171 lb 9.6 oz (77.8 kg)   SpO2 99%   BMI 29.46  kg/m  Gen:  Alert, cooperative patient who appears stated age in no acute distress.  Vital signs reviewed. HEENT: EOMI,  MMM Cardiac:  Regular rate and rhythm without murmur auscultated.  Good S1/S2. Pulm:  Clear to auscultation bilaterally with good air movement.  No wheezes or rales noted.   Abd:  Soft/nondistended/minimal tenderness suprapubically.  Otherwise negative. Back:  Normal-appearing skin overlying back, no bruising.  Spine with normal alignment and no deformity.  No tenderness to vertebral process palpation.  Paraspinous muscles are TTP without spasm BL L1-2 region,  worse on RIght.   Range of motion full at neck and lumbar sacral regions.  Straight leg raise is negative.   Neuro:  Sensation and motor function 5/5 bilateral lower extremities.  Patellar and Achilles DTR's +2 BL.     Results for orders placed or performed in visit on 03/19/18 (from the past 72 hour(s))  POCT urinalysis dipstick     Status: Abnormal   Collection Time: 03/19/18  2:15 PM  Result Value Ref Range   Color, UA yellow yellow   Clarity, UA cloudy (A) clear   Glucose, UA negative negative mg/dL   Bilirubin, UA negative negative   Ketones, POC UA negative negative mg/dL   Spec Grav, UA <=1.102 (A) 1.010 - 1.025   Blood, UA trace-intact (A) negative   pH, UA 6.0 5.0 - 8.0   Protein Ur, POC negative negative mg/dL   Urobilinogen, UA 0.2 0.2 or 1.0 E.U./dL   Nitrite, UA Positive (A) Negative   Leukocytes, UA Moderate (2+) (A) Negative

## 2018-03-19 NOTE — Patient Instructions (Addendum)
It was good to see you again today.  Take the Bactrim once in the AM and once in the PM for the next 7 days.   Take the Norco for back pain.    We will get you set up for a CT scan to check for a kidney stone.    I have also referred you to urology.  They should call you for an appointment.

## 2018-03-20 ENCOUNTER — Ambulatory Visit
Admission: RE | Admit: 2018-03-20 | Discharge: 2018-03-20 | Disposition: A | Discharge status: Home / Self Care | Payer: BLUE CROSS/BLUE SHIELD | Source: Ambulatory Visit | Attending: Family Medicine | Admitting: Family Medicine

## 2018-03-20 DIAGNOSIS — R109 Unspecified abdominal pain: Secondary | ICD-10-CM

## 2018-03-21 ENCOUNTER — Encounter: Payer: Self-pay | Admitting: Family Medicine

## 2018-03-21 NOTE — Assessment & Plan Note (Signed)
Ongoing symptoms of the same. -Repeating UA today which showed persistent blood plus positive leukocyte esterase and positive nitrates.  She has urinary frequency and intermittent dysuria. -No improvement with treatments of Macrobid and Keflex.  Will try more broad-spectrum Bactrim today. -She also has ongoing back pain for the past week or so.  Likely mechanical/musculoskeletal although cannot rule out kidney stone especially blood in the urine. -Short-term course of Norco. -Obtaining CT scan of abdomen to check for kidney stone. --Referral to urology for either positive nephrolithiasis based on the results of the CT scan or referral for ongoing dysuria without other apparent cause and despite being treated multiple times with antibiotics. -If the Bactrim resolves her symptoms she can cancel urology appointment.

## 2018-03-28 ENCOUNTER — Ambulatory Visit (HOSPITAL_COMMUNITY): Payer: BLUE CROSS/BLUE SHIELD | Admitting: Licensed Clinical Social Worker

## 2018-03-29 ENCOUNTER — Encounter: Payer: Self-pay | Admitting: Family Medicine

## 2018-04-16 ENCOUNTER — Emergency Department (HOSPITAL_COMMUNITY)
Admission: EM | Admit: 2018-04-16 | Discharge: 2018-04-17 | Discharge status: Against Medical Advice | Payer: BLUE CROSS/BLUE SHIELD | Attending: Emergency Medicine | Admitting: Emergency Medicine

## 2018-04-16 ENCOUNTER — Encounter (HOSPITAL_COMMUNITY): Payer: Self-pay | Admitting: Emergency Medicine

## 2018-04-16 ENCOUNTER — Other Ambulatory Visit: Payer: Self-pay

## 2018-04-16 DIAGNOSIS — Z5321 Procedure and treatment not carried out due to patient leaving prior to being seen by health care provider: Secondary | ICD-10-CM | POA: Diagnosis not present

## 2018-04-16 DIAGNOSIS — R1110 Vomiting, unspecified: Secondary | ICD-10-CM | POA: Diagnosis present

## 2018-04-16 HISTORY — DX: Gastro-esophageal reflux disease without esophagitis: K21.9

## 2018-04-16 LAB — URINALYSIS, ROUTINE W REFLEX MICROSCOPIC
Bilirubin Urine: NEGATIVE
Glucose, UA: NEGATIVE mg/dL
HGB URINE DIPSTICK: NEGATIVE
Ketones, ur: NEGATIVE mg/dL
Leukocytes, UA: NEGATIVE
Nitrite: NEGATIVE
Protein, ur: NEGATIVE mg/dL
Specific Gravity, Urine: 1.027 (ref 1.005–1.030)
pH: 5 (ref 5.0–8.0)

## 2018-04-16 LAB — CBC
HCT: 35.1 % — ABNORMAL LOW (ref 36.0–46.0)
Hemoglobin: 11.5 g/dL — ABNORMAL LOW (ref 12.0–15.0)
MCH: 29.6 pg (ref 26.0–34.0)
MCHC: 32.8 g/dL (ref 30.0–36.0)
MCV: 90.2 fL (ref 80.0–100.0)
Platelets: UNDETERMINED 10*3/uL (ref 150–400)
RBC: 3.89 MIL/uL (ref 3.87–5.11)
RDW: 14.6 % (ref 11.5–15.5)
WBC: 4.8 10*3/uL (ref 4.0–10.5)
nRBC: 0 % (ref 0.0–0.2)

## 2018-04-16 LAB — COMPREHENSIVE METABOLIC PANEL
ALBUMIN: 4.1 g/dL (ref 3.5–5.0)
ALT: 37 U/L (ref 0–44)
ANION GAP: 15 (ref 5–15)
AST: 229 U/L — ABNORMAL HIGH (ref 15–41)
Alkaline Phosphatase: 84 U/L (ref 38–126)
BUN: 15 mg/dL (ref 6–20)
CALCIUM: 9.2 mg/dL (ref 8.9–10.3)
CO2: 20 mmol/L — ABNORMAL LOW (ref 22–32)
Chloride: 103 mmol/L (ref 98–111)
Creatinine, Ser: 0.92 mg/dL (ref 0.44–1.00)
GFR calc Af Amer: >60 mL/min (ref >60)
GFR calc non Af Amer: >60 mL/min (ref >60)
GLUCOSE: 103 mg/dL — AB (ref 70–99)
Potassium: 4 mmol/L (ref 3.5–5.1)
Sodium: 138 mmol/L (ref 135–145)
Total Bilirubin: 0.7 mg/dL (ref 0.3–1.2)
Total Protein: 7.6 g/dL (ref 6.5–8.1)

## 2018-04-16 LAB — LIPASE, BLOOD: Lipase: 31 U/L (ref 11–51)

## 2018-04-16 LAB — I-STAT BETA HCG BLOOD, ED (MC, WL, AP ONLY): I-stat hCG, quantitative: <5 m[IU]/mL (ref <5)

## 2018-04-16 MED ORDER — SODIUM CHLORIDE 0.9% FLUSH: 3.0000 mL | Freq: Once | INTRAVENOUS | Status: DC

## 2018-04-16 NOTE — ED Triage Notes (Signed)
C/o vomiting since May.  States she has been seen numerous times for same and has been told it is her acid reflux but isn't feeling better.

## 2018-04-17 ENCOUNTER — Encounter (HOSPITAL_COMMUNITY): Payer: Self-pay

## 2018-04-17 ENCOUNTER — Emergency Department (HOSPITAL_COMMUNITY)
Admission: EM | Admit: 2018-04-17 | Discharge: 2018-04-17 | Disposition: A | Discharge status: Home / Self Care | Payer: BLUE CROSS/BLUE SHIELD | Attending: Emergency Medicine | Admitting: Emergency Medicine

## 2018-04-17 ENCOUNTER — Other Ambulatory Visit: Payer: Self-pay

## 2018-04-17 DIAGNOSIS — R112 Nausea with vomiting, unspecified: Secondary | ICD-10-CM | POA: Diagnosis present

## 2018-04-17 DIAGNOSIS — K2100 Gastro-esophageal reflux disease with esophagitis, without bleeding: Secondary | ICD-10-CM

## 2018-04-17 DIAGNOSIS — F17200 Nicotine dependence, unspecified, uncomplicated: Secondary | ICD-10-CM | POA: Diagnosis not present

## 2018-04-17 DIAGNOSIS — E860 Dehydration: Secondary | ICD-10-CM | POA: Diagnosis not present

## 2018-04-17 DIAGNOSIS — I10 Essential (primary) hypertension: Secondary | ICD-10-CM | POA: Diagnosis not present

## 2018-04-17 DIAGNOSIS — Z79899 Other long term (current) drug therapy: Secondary | ICD-10-CM | POA: Insufficient documentation

## 2018-04-17 LAB — CBC WITH DIFFERENTIAL/PLATELET
Abs Immature Granulocytes: 0.01 10*3/uL (ref 0.00–0.07)
Basophils Absolute: 0 10*3/uL (ref 0.0–0.1)
Basophils Relative: 1 %
Eosinophils Absolute: 0.1 10*3/uL (ref 0.0–0.5)
Eosinophils Relative: 3 %
HCT: 35.8 % — ABNORMAL LOW (ref 36.0–46.0)
Hemoglobin: 11.7 g/dL — ABNORMAL LOW (ref 12.0–15.0)
Immature Granulocytes: 0 %
Lymphocytes Relative: 22 %
Lymphs Abs: 0.7 10*3/uL (ref 0.7–4.0)
MCH: 29.9 pg (ref 26.0–34.0)
MCHC: 32.7 g/dL (ref 30.0–36.0)
MCV: 91.6 fL (ref 80.0–100.0)
MONO ABS: 0.3 10*3/uL (ref 0.1–1.0)
Monocytes Relative: 10 %
Neutro Abs: 2 10*3/uL (ref 1.7–7.7)
Neutrophils Relative %: 64 %
Platelets: 110 10*3/uL — ABNORMAL LOW (ref 150–400)
RBC: 3.91 MIL/uL (ref 3.87–5.11)
RDW: 14.7 % (ref 11.5–15.5)
WBC: 3.2 10*3/uL — AB (ref 4.0–10.5)
nRBC: 0 % (ref 0.0–0.2)

## 2018-04-17 LAB — COMPREHENSIVE METABOLIC PANEL
ALK PHOS: 85 U/L (ref 38–126)
ALT: 35 U/L (ref 0–44)
AST: 154 U/L — ABNORMAL HIGH (ref 15–41)
Albumin: 4.3 g/dL (ref 3.5–5.0)
Anion gap: 9 (ref 5–15)
BILIRUBIN TOTAL: 1.8 mg/dL — AB (ref 0.3–1.2)
BUN: 14 mg/dL (ref 6–20)
CO2: 27 mmol/L (ref 22–32)
Calcium: 9.5 mg/dL (ref 8.9–10.3)
Chloride: 101 mmol/L (ref 98–111)
Creatinine, Ser: 0.73 mg/dL (ref 0.44–1.00)
GFR calc Af Amer: >60 mL/min (ref >60)
GFR calc non Af Amer: >60 mL/min (ref >60)
Glucose, Bld: 112 mg/dL — ABNORMAL HIGH (ref 70–99)
Potassium: 3.8 mmol/L (ref 3.5–5.1)
Sodium: 137 mmol/L (ref 135–145)
TOTAL PROTEIN: 7.7 g/dL (ref 6.5–8.1)

## 2018-04-17 LAB — URINALYSIS, ROUTINE W REFLEX MICROSCOPIC
Bilirubin Urine: NEGATIVE
Glucose, UA: NEGATIVE mg/dL
Hgb urine dipstick: NEGATIVE
Ketones, ur: 5 mg/dL — AB
Leukocytes, UA: NEGATIVE
Nitrite: NEGATIVE
Protein, ur: NEGATIVE mg/dL
SPECIFIC GRAVITY, URINE: 1.019 (ref 1.005–1.030)
pH: 5 (ref 5.0–8.0)

## 2018-04-17 LAB — PREGNANCY, URINE: Preg Test, Ur: NEGATIVE

## 2018-04-17 LAB — LIPASE, BLOOD: Lipase: 33 U/L (ref 11–51)

## 2018-04-17 MED ORDER — FAMOTIDINE IN NACL 20-0.9 MG/50ML-% IV SOLN
20.0000 mg | Freq: Once | INTRAVENOUS | Status: AC
  Administered 2018-04-17: 20 mg via INTRAVENOUS
  Filled 2018-04-17: qty 50

## 2018-04-17 MED ORDER — ONDANSETRON HCL 4 MG/2ML IJ SOLN
4.0000 mg | Freq: Once | INTRAMUSCULAR | Status: AC
  Administered 2018-04-17: 4 mg via INTRAVENOUS
  Filled 2018-04-17: qty 2

## 2018-04-17 MED ORDER — FAMOTIDINE 20 MG PO TABS: 20.0000 mg | ORAL_TABLET | Freq: Two times a day (BID) | ORAL | 0 refills | Status: DC

## 2018-04-17 MED ORDER — SODIUM CHLORIDE 0.9 % IV BOLUS
1000.0000 mL | Freq: Once | INTRAVENOUS | Status: AC
  Administered 2018-04-17: 1000 mL via INTRAVENOUS

## 2018-04-17 MED ORDER — PROMETHAZINE HCL 25 MG PO TABS: 25.0000 mg | ORAL_TABLET | Freq: Four times a day (QID) | ORAL | 0 refills | Status: DC | PRN

## 2018-04-17 NOTE — ED Provider Notes (Signed)
[facility]-EMERGENCY DEPT Provider Note   CSN: [id number] Arrival date & time: 04/17/18  5364     History   Chief Complaint Chief Complaint  Patient presents with  . Emesis    HPI Kristin [name] is a 43 y.o. female.  Pt presents to the ED today with n/v.  She said she has had sx since May of 2019.  She has followed with [facility] and has had 2 endoscopies.  She said she was told her esophagus was irritated.  She was given a rx for presumed PPI which she could not fill due to cost.  She called today for an appt, and was told that she was dismissed in December for noncompliance.  She denies any new sx.  No period in a year due to an endometrial ablation.     Past Medical History:  Diagnosis Date  . Acid reflux   . Anxiety   . Arthritis   . Depression   . Eczema   . Hypertension   . Migraine headache     Patient Active Problem List   Diagnosis Date Noted  . Vaginal irritation 03/07/2018  . Acute cystitis without hematuria 11/09/2017  . Injury of toe on right foot 10/16/2017  . Esophagitis determined by endoscopy   . Dehydration   . Nausea & vomiting 09/18/2017  . AKI (acute kidney injury) (HCC)   . Bipolar disorder in remission (HCC)   . Back pain 08/25/2016  . Generalized anxiety disorder 08/23/2015  . Knee pain 12/21/2014  . Dysuria 04/22/2014  . Tinea pedis of both feet 10/02/2013  . Menorrhagia 09/04/2012  . Allergic rhinitis 06/12/2012  . Eczema 03/26/2012  . Alcohol abuse 10/26/2010  . Bipolar 1 disorder (HCC) 10/09/2010  . Panic attacks 10/09/2010  . INSOMNIA, CHRONIC 06/29/2009  . TOBACCO USER 01/23/2009  . Obesity 10/29/2008  . Essential hypertension 12/20/2006    Past Surgical History:  Procedure Laterality Date  . BIOPSY  09/19/2017   Procedure: BIOPSY;  Surgeon: Kathi Der, MD;  Location: [facility];  Service: Gastroenterology;;  . [name] & CURRETTAGE/HYSTROSCOPY WITH HYDROTHERMAL ABLATION N/A 04/04/2017   Procedure: DILATATION & CURETTAGE/HYSTEROSCOPY WITH HYDROTHERMAL ABLATION;  Surgeon: Reva Bores, MD;  Location: [facility];  Service: Gynecology;  Laterality: N/A;  . ESOPHAGOGASTRODUODENOSCOPY (EGD) WITH PROPOFOL N/A 09/19/2017   Procedure: ESOPHAGOGASTRODUODENOSCOPY (EGD) WITH PROPOFOL;  Surgeon: Kathi Der, MD;  Location: [facility];  Service: Gastroenterology;  Laterality: N/A;  . TUBAL LIGATION       OB History    Gravida  4   Para  3   Term  3   Preterm      AB  1   Living  3     SAB      TAB  1   Ectopic      Multiple      Live Births               Home Medications    Prior to Admission medications   Medication Sig Start Date End Date Taking? Authorizing Provider  acetaminophen (TYLENOL) 500 MG tablet Take 1,000 mg by mouth every 6 (six) hours as needed for mild pain.   Yes [provider]  fluticasone (FLONASE) 50 MCG/ACT nasal spray Place 2 sprays into both nostrils daily. Patient taking differently: Place 2 sprays into both nostrils daily as needed for allergies.  02/25/17  Yes Yu, Amy V, PA-C  gabapentin (NEURONTIN) 300 MG capsule TAKE 1  CAPSULE BY MOUTH AT BEDTIME. MAY TAKE UP TO 3 TIMES DAILY IF NO IMPROVEMENT Patient taking differently: Take 300 mg by mouth 3 (three) times daily. TAKE 1 CAPSULE BY MOUTH AT BEDTIME. MAY TAKE UP TO 3 TIMES DAILY IF NO IMPROVEMENT 02/28/18  Yes Marthenia Rolling, DO  lisinopril-hydrochlorothiazide (PRINZIDE,ZESTORETIC) 10-12.5 MG tablet Take 1 tablet by mouth daily. 09/19/17  Yes Marthenia Rolling, DO  QUEtiapine (SEROQUEL XR) 50 MG TB24 24 hr tablet Take 50 mg at bedtime.  May repeat after 30-60 minutes as needed to achieve sleep 03/12/18  Yes Arfeen, Phillips Grout, MD  famotidine (PEPCID) 20 MG tablet Take 1 tablet (20 mg total) by mouth 2 (two) times daily. 04/17/18   Jacalyn Lefevre, MD  folic acid (FOLVITE) 1 MG tablet Take 1 tablet (1 mg total) by mouth daily. Patient not taking: Reported on 04/17/2018  09/22/17   Meccariello, Solmon Ice, DO  HYDROcodone-acetaminophen (NORCO) 10-325 MG tablet Take 1 tablet by mouth every 6 (six) hours as needed. Patient not taking: Reported on 04/17/2018 03/19/18   Tobey Grim, MD  lamoTRIgine (LAMICTAL) 25 MG tablet Take one tab daily for 1 week and than 2 tab daily 03/12/18   Arfeen, Phillips Grout, MD  promethazine (PHENERGAN) 25 MG tablet Take 1 tablet (25 mg total) by mouth every 6 (six) hours as needed for nausea or vomiting. 04/17/18   Jacalyn Lefevre, MD  sertraline (ZOLOFT) 100 MG tablet Take 100 mg (1 tablet) daily for one week, and then increase to 200 mg (2 tablets) daily Take in the morning. Patient not taking: Reported on 04/17/2018 12/05/17   Mariel Craft, MD  sulfamethoxazole-trimethoprim (BACTRIM DS,SEPTRA DS) 800-160 MG tablet Take 1 tablet by mouth 2 (two) times daily. X 7 days Patient not taking: Reported on 04/17/2018 03/19/18   Tobey Grim, MD    Family History Family History  Problem Relation Age of Onset  . Cancer Maternal Grandmother        Breast  . Hypertension Mother   . Hypertension Brother   . Cancer Maternal Uncle        colon  . Bipolar disorder Cousin   . Schizophrenia Cousin     Social History Social History   Tobacco Use  . Smoking status: Current Every Day Smoker    Packs/day: 0.25    Years: 5.00    Pack years: 1.25  . Smokeless tobacco: Never Used  Substance Use Topics  . Alcohol use: Yes    Comment: occassionally   . Drug use: No     Allergies   Sumatriptan   Review of Systems Review of Systems  Gastrointestinal: Positive for nausea and vomiting.  All other systems reviewed and are negative.    Physical Exam Updated Vital Signs BP (!) 127/92 (BP Location: Left Arm)   Pulse 85   Temp 98.3 F (36.8 C) (Oral)   Resp 16   Ht 5' 4.5" (1.638 m)   Wt 77.6 kg   SpO2 100%   BMI 28.90 kg/m   Physical Exam Vitals signs and nursing note reviewed.  Constitutional:      Appearance: Normal appearance.   HENT:     Head: Normocephalic and atraumatic.     Right Ear: External ear normal.     Left Ear: External ear normal.     Nose: Nose normal.     Mouth/Throat:     Mouth: Mucous membranes are dry.  Eyes:     Extraocular Movements: Extraocular movements intact.  Conjunctiva/sclera: Conjunctivae normal.     Pupils: Pupils are equal, round, and reactive to light.  Neck:     Musculoskeletal: Normal range of motion and neck supple.  Cardiovascular:     Rate and Rhythm: Normal rate and regular rhythm.     Pulses: Normal pulses.     Heart sounds: Normal heart sounds.  Pulmonary:     Effort: Pulmonary effort is normal.     Breath sounds: Normal breath sounds.  Abdominal:     General: Abdomen is flat. Bowel sounds are normal.     Palpations: Abdomen is soft.  Musculoskeletal: Normal range of motion.  Skin:    General: Skin is warm.     Capillary Refill: Capillary refill takes less than 2 seconds.  Neurological:     General: No focal deficit present.     Mental Status: She is alert and oriented to person, place, and time.  Psychiatric:        Mood and Affect: Mood normal.        Behavior: Behavior normal.      ED Treatments / Results  Labs (all labs ordered are listed, but only abnormal results are displayed) Labs Reviewed  CBC WITH DIFFERENTIAL/PLATELET - Abnormal; Notable for the following components:      Result Value   WBC 3.2 (*)    Hemoglobin 11.7 (*)    HCT 35.8 (*)    Platelets 110 (*)    All other components within normal limits  URINALYSIS, ROUTINE W REFLEX MICROSCOPIC - Abnormal; Notable for the following components:   APPearance HAZY (*)    Ketones, ur 5 (*)    All other components within normal limits  COMPREHENSIVE METABOLIC PANEL - Abnormal; Notable for the following components:   Glucose, Bld 112 (*)    AST 154 (*)    Total Bilirubin 1.8 (*)    All other components within normal limits  LIPASE, BLOOD  PREGNANCY, URINE    EKG None  Radiology No  results found.  Procedures Procedures (including critical care time)  Medications Ordered in ED Medications  sodium chloride 0.9 % bolus 1,000 mL (1,000 mLs Intravenous New Bag/Given 04/17/18 1521)  ondansetron (ZOFRAN) injection 4 mg (4 mg Intravenous Given 04/17/18 1518)  famotidine (PEPCID) IVPB 20 mg premix (20 mg Intravenous New Bag/Given 04/17/18 1518)     Initial Impression / Assessment and Plan / ED Course  I have reviewed the triage vital signs and the nursing notes.  Pertinent labs & imaging results that were available during my care of the patient were reviewed by me and considered in my medical decision making (see chart for details).  Pt is feeling much better.  She is given a rx for phenergan and pepcid which are cheaper.  She is instructed to f/u with GI.  Return if worse.  F/u with pcp.  Final Clinical Impressions(s) / ED Diagnoses   Final diagnoses:  Gastroesophageal reflux disease with esophagitis  Dehydration  Non-intractable vomiting with nausea, unspecified vomiting type    ED Discharge Orders         Ordered    promethazine (PHENERGAN) 25 MG tablet  Every 6 hours PRN     04/17/18 1634    famotidine (PEPCID) 20 MG tablet  2 times daily     04/17/18 1634           Jacalyn Lefevre, MD 04/17/18 1635

## 2018-04-17 NOTE — ED Triage Notes (Signed)
Pt states that she has been having emesis since May. Pt states that she had an endoscopy x2, without any definitive results. 3 episodes of emesis in 24 hours.

## 2018-04-17 NOTE — ED Notes (Signed)
No answer in the waiting room x 3 for room

## 2018-04-25 ENCOUNTER — Ambulatory Visit (HOSPITAL_COMMUNITY): Payer: BLUE CROSS/BLUE SHIELD | Admitting: Psychiatry

## 2018-05-13 ENCOUNTER — Other Ambulatory Visit: Payer: Self-pay | Admitting: Family Medicine

## 2018-05-13 DIAGNOSIS — M5442 Lumbago with sciatica, left side: Principal | ICD-10-CM

## 2018-05-13 DIAGNOSIS — G8929 Other chronic pain: Secondary | ICD-10-CM

## 2018-05-13 DIAGNOSIS — M5441 Lumbago with sciatica, right side: Principal | ICD-10-CM

## 2018-05-14 ENCOUNTER — Other Ambulatory Visit: Payer: Self-pay

## 2018-05-14 DIAGNOSIS — M5442 Lumbago with sciatica, left side: Principal | ICD-10-CM

## 2018-05-14 DIAGNOSIS — M5441 Lumbago with sciatica, right side: Principal | ICD-10-CM

## 2018-05-14 DIAGNOSIS — G8929 Other chronic pain: Secondary | ICD-10-CM

## 2018-05-15 MED ORDER — GABAPENTIN 300 MG PO CAPS: 300.0000 mg | ORAL_CAPSULE | Freq: Three times a day (TID) | ORAL | 0 refills | Status: DC

## 2018-06-10 ENCOUNTER — Telehealth (INDEPENDENT_AMBULATORY_CARE_PROVIDER_SITE_OTHER): Payer: Self-pay | Admitting: Psychiatry

## 2018-06-10 ENCOUNTER — Other Ambulatory Visit: Payer: Self-pay

## 2018-06-10 DIAGNOSIS — F319 Bipolar disorder, unspecified: Secondary | ICD-10-CM

## 2018-06-10 DIAGNOSIS — F411 Generalized anxiety disorder: Secondary | ICD-10-CM

## 2018-06-10 DIAGNOSIS — F1010 Alcohol abuse, uncomplicated: Secondary | ICD-10-CM

## 2018-06-10 MED ORDER — LAMOTRIGINE 25 MG PO TABS: ORAL_TABLET | ORAL | 1 refills | Status: DC

## 2018-06-10 MED ORDER — QUETIAPINE FUMARATE 100 MG PO TABS: 100.0000 mg | ORAL_TABLET | Freq: Every day | ORAL | 1 refills | Status: DC
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
## 2018-06-15 ENCOUNTER — Other Ambulatory Visit: Payer: Self-pay | Admitting: Family Medicine

## 2018-06-15 DIAGNOSIS — M5442 Lumbago with sciatica, left side: Principal | ICD-10-CM

## 2018-06-15 DIAGNOSIS — G8929 Other chronic pain: Secondary | ICD-10-CM

## 2018-06-15 DIAGNOSIS — M5441 Lumbago with sciatica, right side: Principal | ICD-10-CM

## 2018-07-23 ENCOUNTER — Other Ambulatory Visit: Payer: Self-pay

## 2018-07-23 ENCOUNTER — Ambulatory Visit (INDEPENDENT_AMBULATORY_CARE_PROVIDER_SITE_OTHER): Payer: BLUE CROSS/BLUE SHIELD | Admitting: Psychiatry

## 2018-07-23 ENCOUNTER — Encounter (HOSPITAL_COMMUNITY): Payer: Self-pay | Admitting: Psychiatry

## 2018-07-23 DIAGNOSIS — F411 Generalized anxiety disorder: Secondary | ICD-10-CM | POA: Diagnosis not present

## 2018-07-23 DIAGNOSIS — F419 Anxiety disorder, unspecified: Secondary | ICD-10-CM

## 2018-07-23 DIAGNOSIS — F1010 Alcohol abuse, uncomplicated: Secondary | ICD-10-CM

## 2018-07-23 DIAGNOSIS — F319 Bipolar disorder, unspecified: Secondary | ICD-10-CM

## 2018-07-23 MED ORDER — QUETIAPINE FUMARATE 100 MG PO TABS: 100.0000 mg | ORAL_TABLET | Freq: Every day | ORAL | 1 refills | Status: DC

## 2018-07-23 MED ORDER — HYDROXYZINE PAMOATE 25 MG PO CAPS: 25.0000 mg | ORAL_CAPSULE | Freq: Every day | ORAL | 1 refills | Status: DC | PRN

## 2018-07-23 MED ORDER — LAMOTRIGINE 25 MG PO TABS: ORAL_TABLET | ORAL | 1 refills | Status: DC
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
## 2018-08-06 ENCOUNTER — Ambulatory Visit: Payer: BLUE CROSS/BLUE SHIELD | Admitting: Family Medicine

## 2018-08-07 ENCOUNTER — Emergency Department (HOSPITAL_COMMUNITY)
Admission: EM | Admit: 2018-08-07 | Discharge: 2018-08-08 | Disposition: A | Discharge status: Home / Self Care | Payer: BLUE CROSS/BLUE SHIELD | Attending: Emergency Medicine | Admitting: Emergency Medicine

## 2018-08-07 ENCOUNTER — Encounter (HOSPITAL_COMMUNITY): Payer: Self-pay

## 2018-08-07 ENCOUNTER — Other Ambulatory Visit: Payer: Self-pay

## 2018-08-07 DIAGNOSIS — Y908 Blood alcohol level of 240 mg/100 ml or more: Secondary | ICD-10-CM | POA: Diagnosis not present

## 2018-08-07 DIAGNOSIS — R4689 Other symptoms and signs involving appearance and behavior: Secondary | ICD-10-CM | POA: Diagnosis present

## 2018-08-07 DIAGNOSIS — R45851 Suicidal ideations: Secondary | ICD-10-CM | POA: Diagnosis not present

## 2018-08-07 DIAGNOSIS — F332 Major depressive disorder, recurrent severe without psychotic features: Secondary | ICD-10-CM | POA: Insufficient documentation

## 2018-08-07 DIAGNOSIS — I10 Essential (primary) hypertension: Secondary | ICD-10-CM | POA: Diagnosis not present

## 2018-08-07 DIAGNOSIS — F10920 Alcohol use, unspecified with intoxication, uncomplicated: Secondary | ICD-10-CM | POA: Diagnosis not present

## 2018-08-07 DIAGNOSIS — F17210 Nicotine dependence, cigarettes, uncomplicated: Secondary | ICD-10-CM | POA: Insufficient documentation

## 2018-08-07 DIAGNOSIS — Z1159 Encounter for screening for other viral diseases: Secondary | ICD-10-CM | POA: Insufficient documentation

## 2018-08-07 LAB — CBC WITH DIFFERENTIAL/PLATELET
Abs Immature Granulocytes: 0.01 10*3/uL (ref 0.00–0.07)
Basophils Absolute: 0.1 10*3/uL (ref 0.0–0.1)
Basophils Relative: 2 %
Eosinophils Absolute: 0.3 10*3/uL (ref 0.0–0.5)
Eosinophils Relative: 6 %
HCT: 35.1 % — ABNORMAL LOW (ref 36.0–46.0)
Hemoglobin: 12.2 g/dL (ref 12.0–15.0)
Immature Granulocytes: 0 %
Lymphocytes Relative: 36 %
Lymphs Abs: 1.7 10*3/uL (ref 0.7–4.0)
MCH: 30.7 pg (ref 26.0–34.0)
MCHC: 34.8 g/dL (ref 30.0–36.0)
MCV: 88.4 fL (ref 80.0–100.0)
Monocytes Absolute: 0.6 10*3/uL (ref 0.1–1.0)
Monocytes Relative: 12 %
Neutro Abs: 2.1 10*3/uL (ref 1.7–7.7)
Neutrophils Relative %: 44 %
Platelets: 135 10*3/uL — ABNORMAL LOW (ref 150–400)
RBC: 3.97 MIL/uL (ref 3.87–5.11)
RDW: 14.2 % (ref 11.5–15.5)
WBC: 4.7 10*3/uL (ref 4.0–10.5)
nRBC: 0 % (ref 0.0–0.2)

## 2018-08-07 MED ORDER — ZIPRASIDONE MESYLATE 20 MG IM SOLR: 20.0000 mg | Freq: Once | INTRAMUSCULAR | Status: DC

## 2018-08-07 MED ORDER — STERILE WATER FOR INJECTION IJ SOLN
INTRAMUSCULAR | Status: AC
  Filled 2018-08-07: qty 10

## 2018-08-07 MED ORDER — ZIPRASIDONE MESYLATE 20 MG IM SOLR
20.0000 mg | Freq: Once | INTRAMUSCULAR | Status: AC | PRN
  Administered 2018-08-08: 20 mg via INTRAMUSCULAR
  Filled 2018-08-07: qty 20

## 2018-08-07 NOTE — ED Notes (Signed)
Pt presents with [employer] for SI/HI, bipolar, [employer] called out because she was threatening she "did not want to live anymore". BF took away pills a couple nights ago, states that was her plan to end her life. Pt verbally abusive and uncooperative.

## 2018-08-07 NOTE — ED Notes (Signed)
Daughters- [phone] Illene Silver

## 2018-08-07 NOTE — ED Provider Notes (Addendum)
[facility] EMERGENCY DEPARTMENT Provider Note   CSN: [id number] Arrival date & time: 08/07/18  2237    History   Chief Complaint Chief Complaint  Patient presents with  . Suicidal    HPI Kristin [name] is a 43 y.o. female.     43 year old female with a history of depression, anger issues, hypertension presents to the emergency department with [employer].  Police were called to the house due to a family altercation.  The patient was expressing suicidal thoughts and EMS was called to transport her to the hospital.  On my assessment, the patient is angry, agitated.  She was temporarily redirectable.  Was more calm and tearful regarding the passing of her brother almost 1 year ago.  She states that he "said he would never leave me, but now he is gone".  States that she has had thoughts to overdose on her prescribed medications.  Alleges that she overdosed on pills a few days ago with intention of suicide, but it "did not work".  She has been drinking alcohol excessively tonight.  Denies illicit drug use today.  Escalated her tone and aggression; has been combative with staff and police.  She is now demanding to leave the ED.  Followed by Dr. Lolly Mustache at [facility] Reports her medication was changed 1 week ago.     Past Medical History:  Diagnosis Date  . Acid reflux   . Anxiety   . Arthritis   . Depression   . Eczema   . Hypertension   . Migraine headache     Patient Active Problem List   Diagnosis Date Noted  . Vaginal irritation 03/07/2018  . Acute cystitis without hematuria 11/09/2017  . Injury of toe on right foot 10/16/2017  . Esophagitis determined by endoscopy   . Dehydration   . Nausea & vomiting 09/18/2017  . AKI (acute kidney injury) (HCC)   . Bipolar disorder in remission (HCC)   . Back pain 08/25/2016  . Generalized anxiety disorder 08/23/2015  . Knee pain 12/21/2014  . Dysuria 04/22/2014  . Tinea pedis of both feet 10/02/2013  . Menorrhagia 09/04/2012   . Allergic rhinitis 06/12/2012  . Eczema 03/26/2012  . Alcohol abuse 10/26/2010  . Bipolar 1 disorder (HCC) 10/09/2010  . Panic attacks 10/09/2010  . INSOMNIA, CHRONIC 06/29/2009  . TOBACCO USER 01/23/2009  . Obesity 10/29/2008  . Essential hypertension 12/20/2006    Past Surgical History:  Procedure Laterality Date  . BIOPSY  09/19/2017   Procedure: BIOPSY;  Surgeon: Kathi Der, MD;  Location: [facility] END[facility]COPY;  Service: Gastroenterology;;  . [name] & CURRETTAGE/HYSTROSCOPY WITH HYDROTHERMAL ABLATION N/A 04/04/2017   Procedure: DILATATION & CURETTAGE/HYSTEROSCOPY WITH HYDROTHERMAL ABLATION;  Surgeon: Reva Bores, MD;  Location: [facility];  Service: Gynecology;  Laterality: N/A;  . ESOPHAGOGASTRODUODENOSCOPY (EGD) WITH PROPOFOL N/A 09/19/2017   Procedure: ESOPHAGOGASTRODUODENOSCOPY (EGD) WITH PROPOFOL;  Surgeon: Kathi Der, MD;  Location: [facility];  Service: Gastroenterology;  Laterality: N/A;  . TUBAL LIGATION       OB History    Gravida  4   Para  3   Term  3   Preterm      AB  1   Living  3     SAB      TAB  1   Ectopic      Multiple      Live Births               Home Medications  Prior to Admission medications   Medication Sig Start Date End Date Taking? Authorizing Provider  acetaminophen (TYLENOL) 500 MG tablet Take 1,000 mg by mouth every 6 (six) hours as needed for mild pain.    [provider]  famotidine (PEPCID) 20 MG tablet Take 1 tablet (20 mg total) by mouth 2 (two) times daily. 04/17/18   Jacalyn Lefevre, MD  fluticasone (FLONASE) 50 MCG/ACT nasal spray Place 2 sprays into both nostrils daily. Patient taking differently: Place 2 sprays into both nostrils daily as needed for allergies.  02/25/17   Cathie Hoops, Amy V, PA-C  folic acid (FOLVITE) 1 MG tablet Take 1 tablet (1 mg total) by mouth daily. Patient not taking: Reported on 04/17/2018 09/22/17   Meccariello, Solmon Ice, DO  gabapentin (NEURONTIN) 300 MG  capsule TAKE 1 CAPSULE BY MOUTH THREE TIMES DAILY. TAKE 1 CAPSULE BY MOUTH AT BEDTIME, MAY TAKE UP TO 3 TIMES DAILY IF NO IMPROVEMENT 06/17/18   Marthenia Rolling, DO  hydrOXYzine (VISTARIL) 25 MG capsule Take 1 capsule (25 mg total) by mouth daily as needed for anxiety. 07/23/18   Arfeen, Phillips Grout, MD  lamoTRIgine (LAMICTAL) 25 MG tablet Take one tab daily for 1 week and than 2 tab daily 07/23/18   Arfeen, Phillips Grout, MD  lisinopril-hydrochlorothiazide (PRINZIDE,ZESTORETIC) 10-12.5 MG tablet Take 1 tablet by mouth daily. 09/19/17   Marthenia Rolling, DO  promethazine (PHENERGAN) 25 MG tablet Take 1 tablet (25 mg total) by mouth every 6 (six) hours as needed for nausea or vomiting. 04/17/18   Jacalyn Lefevre, MD  QUEtiapine (SEROQUEL) 100 MG tablet Take 1 tablet (100 mg total) by mouth at bedtime. 07/23/18 07/23/19  Cleotis Nipper, MD    Family History Family History  Problem Relation Age of Onset  . Cancer Maternal Grandmother        Breast  . Hypertension Mother   . Hypertension Brother   . Cancer Maternal Uncle        colon  . Bipolar disorder Cousin   . Schizophrenia Cousin     Social History Social History   Tobacco Use  . Smoking status: Current Every Day Smoker    Packs/day: 0.25    Years: 5.00    Pack years: 1.25  . Smokeless tobacco: Never Used  Substance Use Topics  . Alcohol use: Yes    Comment: occassionally   . Drug use: No     Allergies   Sumatriptan   Review of Systems Review of Systems Ten systems reviewed and are negative for acute change, except as noted in the HPI.    Physical Exam Updated Vital Signs BP 100/64 (BP Location: Right Arm)   Pulse (!) 101   Temp 98.6 F (37 C) (Oral)   Resp 20   Ht 5' 5.5" (1.664 m)   Wt 77.1 kg   SpO2 96%   BMI 27.86 kg/m   Physical Exam Vitals signs and nursing note reviewed.  Constitutional:      General: She is not in acute distress.    Appearance: She is well-developed. She is not diaphoretic.     Comments: Nontoxic and in  NAD  HENT:     Head: Normocephalic and atraumatic.  Eyes:     General: No scleral icterus.    Conjunctiva/sclera: Conjunctivae normal.  Neck:     Musculoskeletal: Normal range of motion.  Pulmonary:     Effort: Pulmonary effort is normal. No respiratory distress.     Comments: Respirations even and unlabored Musculoskeletal:  Normal range of motion.  Skin:    General: Skin is warm and dry.     Coloration: Skin is not pale.     Findings: No erythema or rash.  Neurological:     Mental Status: She is alert and oriented to person, place, and time.  Psychiatric:        Mood and Affect: Mood is depressed. Affect is angry and tearful.        Speech: Speech is rapid and pressured.        Behavior: Behavior is agitated and combative.        Thought Content: Thought content includes suicidal ideation. Thought content includes suicidal plan.     Comments: Patient expressing plans to OD on her prescribed medications. States she wants "to die".      ED Treatments / Results  Labs (all labs ordered are listed, but only abnormal results are displayed) Labs Reviewed  CBC WITH DIFFERENTIAL/PLATELET - Abnormal; Notable for the following components:      Result Value   HCT 35.1 (*)    Platelets 135 (*)    All other components within normal limits  COMPREHENSIVE METABOLIC PANEL - Abnormal; Notable for the following components:   CO2 19 (*)    BUN 22 (*)    AST 81 (*)    Anion gap 17 (*)    All other components within normal limits  ETHANOL - Abnormal; Notable for the following components:   Alcohol, Ethyl (B) 382 (*)    All other components within normal limits  ACETAMINOPHEN LEVEL - Abnormal; Notable for the following components:   Acetaminophen (Tylenol), Serum <10 (*)    All other components within normal limits  SARS CORONAVIRUS 2 (HOSPITAL ORDER, PERFORMED IN [facility])  SALICYLATE LEVEL  RAPID URINE DRUG SCREEN, HOSP PERFORMED  I-STAT BETA HCG BLOOD, ED ([facility], [facility], [facility]  ONLY)    EKG None  Radiology No results found.  Procedures Procedures (including critical care time)  Medications Ordered in ED Medications  sterile water (preservative free) injection (has no administration in time range)  ziprasidone (GEODON) injection 20 mg (20 mg Intramuscular Given 08/08/18 0002)     Initial Impression / Assessment and Plan / ED Course  I have reviewed the triage vital signs and the nursing notes.  Pertinent labs & imaging results that were available during my care of the patient were reviewed by me and considered in my medical decision making (see chart for details).        43 year old female presents to the emergency department for psychiatric evaluation.  Was transported by [facility] after she continued to express suicidal ideations.  Did endorse suicidal thoughts during my encounter with her.  Expressed plans to overdose on her prescribed medications.  States that she tried to overdose on medication a few days ago, but it "did not work".  Clinically intoxicated upon ED arrival.  Upon attempt to leave the emergency department, IVC papers initiated.  She did require Geodon for sedation secondary to agitation.  Pending TTS evaluation for recommendations.  The patient has been medically cleared at this time.  Disposition to be determined by oncoming ED provider.   Final Clinical Impressions(s) / ED Diagnoses   Final diagnoses:  Severe episode of recurrent major depressive disorder, without psychotic features (HCC)  Alcoholic intoxication without complication [facility])    ED Discharge Orders    None       Antony Madura, PA-C 08/08/18 0531    Jobe Gibbon,  Tresa Endo, PA-C 08/08/18 0531    Nira Conn, MD 08/08/18 [phone]

## 2018-08-07 NOTE — ED Notes (Signed)
Patient has been IVC'd--Kristin [name] 

## 2018-08-08 DIAGNOSIS — R45851 Suicidal ideations: Secondary | ICD-10-CM | POA: Diagnosis not present

## 2018-08-08 LAB — SALICYLATE LEVEL: Salicylate Lvl: <7 mg/dL (ref 2.8–30.0)

## 2018-08-08 LAB — I-STAT BETA HCG BLOOD, ED (MC, WL, AP ONLY): I-stat hCG, quantitative: <5 m[IU]/mL (ref <5)

## 2018-08-08 LAB — COMPREHENSIVE METABOLIC PANEL
ALT: 31 U/L (ref 0–44)
AST: 81 U/L — ABNORMAL HIGH (ref 15–41)
Albumin: 4.3 g/dL (ref 3.5–5.0)
Alkaline Phosphatase: 77 U/L (ref 38–126)
Anion gap: 17 — ABNORMAL HIGH (ref 5–15)
BUN: 22 mg/dL — ABNORMAL HIGH (ref 6–20)
CO2: 19 mmol/L — ABNORMAL LOW (ref 22–32)
Calcium: 9.3 mg/dL (ref 8.9–10.3)
Chloride: 104 mmol/L (ref 98–111)
Creatinine, Ser: 0.84 mg/dL (ref 0.44–1.00)
GFR calc Af Amer: >60 mL/min (ref >60)
GFR calc non Af Amer: >60 mL/min (ref >60)
Glucose, Bld: 85 mg/dL (ref 70–99)
Potassium: 3.5 mmol/L (ref 3.5–5.1)
Sodium: 140 mmol/L (ref 135–145)
Total Bilirubin: 1 mg/dL (ref 0.3–1.2)
Total Protein: 7.5 g/dL (ref 6.5–8.1)

## 2018-08-08 LAB — ACETAMINOPHEN LEVEL: Acetaminophen (Tylenol), Serum: <10 ug/mL — ABNORMAL LOW (ref 10–30)

## 2018-08-08 LAB — SARS CORONAVIRUS 2 BY RT PCR (HOSPITAL ORDER, PERFORMED IN ~~LOC~~ HOSPITAL LAB): SARS Coronavirus 2: NEGATIVE
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
## 2018-08-08 NOTE — ED Notes (Signed)
Pt offered snack, only wanted a coke to drink.

## 2018-08-08 NOTE — ED Notes (Signed)
MOTHER [name] [phone] Arlana Pouch [phone]

## 2018-08-08 NOTE — ED Notes (Signed)
Pt oob to bathroom and sitting up in bed. Now awake and alert

## 2018-08-08 NOTE — BH Assessment (Signed)
[facility] Assessment Progress Note   Pt BAL was 382 at 23:39.  Pt to be assessed when BAL is lower.  Clinician talked with Gabriel Rung, RN who confirmed that patient is still sleeping soundly from geodon administered earlier.

## 2018-08-08 NOTE — ED Notes (Addendum)
Pt rouses briefly and responds she is "too sleepy" to do tele psych assessment. Returns to sleep

## 2018-08-08 NOTE — ED Notes (Signed)
Pt discharged in care of Mother [name]. Pt and mother states understanding of instructions and follow up.

## 2018-08-08 NOTE — ED Notes (Signed)
Dr. Judd Lien made aware of [facility] recommendation. Chart reviewed and IVC rescind paperwork FaXED TO [facility].
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
## 2018-08-08 NOTE — Discharge Instructions (Signed)
Please come back to the emergency department for any new or worsening symptoms. °

## 2018-08-08 NOTE — ED Notes (Signed)
Please refer to downtime charting from 01:00a-02:30a 

## 2018-08-08 NOTE — ED Notes (Signed)
TTS in progress 

## 2018-08-08 NOTE — BH Assessment (Signed)
Clinician spoke to pt's nurse who states pt is unable to participate in [facility] Assessment at this time due to the medication she was provided to assist in her ability to relax; pt's nurse stated pt was snoring and sleeping at this time.

## 2018-08-08 NOTE — Progress Notes (Signed)
   08/08/18 0750  General Assessment Data  Reason for not completing assessment [facility] attempted to complete assessment, pt was unable to be aroused per nurse.  TTS will attempt to assess pt at a later time.    Sascha Palma L. Berdie Malter, MS, [facility], [facility] Therapeutic Triage Specialist  [phone]

## 2018-08-09 LAB — ETHANOL: Alcohol, Ethyl (B): 382 mg/dL (ref <10)

## 2018-08-14 ENCOUNTER — Ambulatory Visit (HOSPITAL_COMMUNITY)
Admission: EM | Admit: 2018-08-14 | Discharge: 2018-08-14 | Disposition: A | Discharge status: Home / Self Care | Payer: BLUE CROSS/BLUE SHIELD | Attending: Family Medicine | Admitting: Family Medicine

## 2018-08-14 ENCOUNTER — Other Ambulatory Visit: Payer: Self-pay

## 2018-08-14 ENCOUNTER — Encounter (HOSPITAL_COMMUNITY): Payer: Self-pay

## 2018-08-14 DIAGNOSIS — N390 Urinary tract infection, site not specified: Secondary | ICD-10-CM | POA: Insufficient documentation

## 2018-08-14 LAB — POCT URINALYSIS DIP (DEVICE)
Bilirubin Urine: NEGATIVE
Glucose, UA: NEGATIVE mg/dL
Ketones, ur: NEGATIVE mg/dL
Nitrite: POSITIVE — AB
Protein, ur: 30 mg/dL — AB
Specific Gravity, Urine: 1.025 (ref 1.005–1.030)
Urobilinogen, UA: 1 mg/dL (ref 0.0–1.0)
pH: 6 (ref 5.0–8.0)

## 2018-08-14 MED ORDER — NITROFURANTOIN MONOHYD MACRO 100 MG PO CAPS: 100.0000 mg | ORAL_CAPSULE | Freq: Two times a day (BID) | ORAL | 0 refills | Status: DC

## 2018-08-14 NOTE — ED Provider Notes (Signed)
[facility]    CSN: [id number] Arrival date & time: 08/14/18  1206     History   Chief Complaint Chief Complaint  Patient presents with  . Urinary Tract Infection    HPI Kristin Kristin [name] is Kristin [name] 43 y.o. female.   Patient is Kristin [name] 43 year old female with Kristin [name] past medical history of anxiety, acid reflux, arthritis, depression, migraine, hypertension.  She presents with approximate 1 week of dysuria, urinary frequency.  She has also had some mild odor and cloudiness to urine.  Denies any hematuria.  She has had some mild lower back discomfort but denies any lower abdominal pain, fevers or nausea.  Reporting history of recurrent UTIs.  Denies any vaginal discharge, itching or irritation.  Denies any concern for STDs.  Ablation for birth control.  She has not taken anything to treat her symptoms.  ROS per HPI      Past Medical History:  Diagnosis Date  . Acid reflux   . Anxiety   . Arthritis   . Depression   . Eczema   . Hypertension   . Migraine headache     Patient Active Problem List   Diagnosis Date Noted  . Suicidal ideation 08/08/2018  . Vaginal irritation 03/07/2018  . Acute cystitis without hematuria 11/09/2017  . Injury of toe on right foot 10/16/2017  . Esophagitis determined by endoscopy   . Dehydration   . Nausea & vomiting 09/18/2017  . AKI (acute kidney injury) (HCC)   . Bipolar disorder in remission (HCC)   . Back pain 08/25/2016  . Generalized anxiety disorder 08/23/2015  . Knee pain 12/21/2014  . Dysuria 04/22/2014  . Tinea pedis of both feet 10/02/2013  . Menorrhagia 09/04/2012  . Allergic rhinitis 06/12/2012  . Eczema 03/26/2012  . Alcohol abuse 10/26/2010  . Bipolar 1 disorder (HCC) 10/09/2010  . Panic attacks 10/09/2010  . INSOMNIA, CHRONIC 06/29/2009  . TOBACCO USER 01/23/2009  . Obesity 10/29/2008  . Essential hypertension 12/20/2006    Past Surgical History:  Procedure Laterality Date  . BIOPSY  09/19/2017   Procedure: BIOPSY;   Surgeon: Kathi Der, MD;  [facility];  Service: Gastroenterology;;  . [name] & CURRETTAGE/HYSTROSCOPY WITH HYDROTHERMAL ABLATION N/Kristin [name] 04/04/2017   Procedure: DILATATION & CURETTAGE/HYSTEROSCOPY WITH HYDROTHERMAL ABLATION;  Surgeon: Reva Bores, MD;  Location: [facility];  Service: Gynecology;  Laterality: N/Kristin [name];  . ESOPHAGOGASTRODUODENOSCOPY (EGD) WITH PROPOFOL N/Kristin [name] 09/19/2017   Procedure: ESOPHAGOGASTRODUODENOSCOPY (EGD) WITH PROPOFOL;  Surgeon: Kathi Der, MD;  [facility];  Service: Gastroenterology;  Laterality: N/Kristin [name];  . TUBAL LIGATION      OB History    Gravida  4   Para  3   Term  3   Preterm      AB  1   Living  3     SAB      TAB  1   Ectopic      Multiple      Live Births               Home Medications    Prior to Admission medications   Medication Sig Start Date End Date Taking? Authorizing Provider  acetaminophen (TYLENOL) 500 MG tablet Take 1,000 mg by mouth every 6 (six) hours as needed for mild pain.    [provider]  famotidine (PEPCID) 20 MG tablet Take 1 tablet (20 mg total) by mouth 2 (two) times daily. 04/17/18   Jacalyn Lefevre, MD  fluticasone (FLONASE) 50 MCG/ACT  nasal spray Place 2 sprays into both nostrils daily. Patient taking differently: Place 2 sprays into both nostrils daily as needed for allergies.  02/25/17   Cathie Hoops, Amy V, PA-C  folic acid (FOLVITE) 1 MG tablet Take 1 tablet (1 mg total) by mouth daily. Patient not taking: Reported on 04/17/2018 09/22/17   Meccariello, Solmon Ice, DO  gabapentin (NEURONTIN) 300 MG capsule TAKE 1 CAPSULE BY MOUTH THREE TIMES DAILY. TAKE 1 CAPSULE BY MOUTH AT BEDTIME, MAY TAKE UP TO 3 TIMES DAILY IF NO IMPROVEMENT 06/17/18   Marthenia Rolling, DO  hydrOXYzine (VISTARIL) 25 MG capsule Take 1 capsule (25 mg total) by mouth daily as needed for anxiety. 07/23/18   Arfeen, Phillips Grout, MD  lamoTRIgine (LAMICTAL) 25 MG tablet Take one tab daily for 1 week and than 2 tab  daily 07/23/18   Arfeen, Phillips Grout, MD  lisinopril-hydrochlorothiazide (PRINZIDE,ZESTORETIC) 10-12.5 MG tablet Take 1 tablet by mouth daily. 09/19/17   Marthenia Rolling, DO  nitrofurantoin, macrocrystal-monohydrate, (MACROBID) 100 MG capsule Take 1 capsule (100 mg total) by mouth 2 (two) times daily. 08/14/18   Kristin Byes A, NP  promethazine (PHENERGAN) 25 MG tablet Take 1 tablet (25 mg total) by mouth every 6 (six) hours as needed for nausea or vomiting. 04/17/18   Jacalyn Lefevre, MD  QUEtiapine (SEROQUEL) 100 MG tablet Take 1 tablet (100 mg total) by mouth at bedtime. 07/23/18 07/23/19  Cleotis Nipper, MD    Family History Family History  Problem Relation Age of Onset  . Cancer Maternal Grandmother        Breast  . Hypertension Mother   . Hypertension Brother   . Cancer Maternal Uncle        colon  . Bipolar disorder Cousin   . Schizophrenia Cousin     Social History Social History   Tobacco Use  . Smoking status: Current Every Day Smoker    Packs/day: 0.25    Years: 5.00    Pack years: 1.25  . Smokeless tobacco: Never Used  Substance Use Topics  . Alcohol use: Yes    Comment: occassionally   . Drug use: No     Allergies   Sumatriptan   Review of Systems Review of Systems   Physical Exam Triage Vital Signs ED Triage Vitals  Enc Vitals Group     BP 08/14/18 1220 (!) 150/96     Pulse Rate 08/14/18 1220 73     Resp 08/14/18 1220 18     Temp 08/14/18 1220 98.9 F (37.2 C)     Temp Source 08/14/18 1220 Oral     SpO2 08/14/18 1220 99 %     Weight --      Height --      Head Circumference --      Peak Flow --      Pain Score 08/14/18 1222 6     Pain Loc --      Pain Edu? --      Excl. in GC? --    No data found.  Updated Vital Signs BP (!) 150/96 (BP Location: Left Arm)   Pulse 73   Temp 98.9 F (37.2 C) (Oral)   Resp 18   SpO2 99%   Visual Acuity Right Eye Distance:   Left Eye Distance:   Bilateral Distance:    Right Eye Near:   Left Eye Near:     Bilateral Near:     Physical Exam Vitals signs and nursing note reviewed.  Constitutional:  General: She is not in acute distress.    Appearance: Normal appearance. She is not ill-appearing, toxic-appearing or diaphoretic.  HENT:     Head: Normocephalic.     Nose: Nose normal.     Mouth/Throat:     Pharynx: Oropharynx is clear.  Eyes:     Conjunctiva/sclera: Conjunctivae normal.  Neck:     Musculoskeletal: Normal range of motion.  Pulmonary:     Effort: Pulmonary effort is normal.  Abdominal:     Palpations: Abdomen is soft.     Tenderness: There is no abdominal tenderness.  Musculoskeletal: Normal range of motion.  Skin:    General: Skin is warm and dry.     Findings: No rash.  Neurological:     Mental Status: She is alert.  Psychiatric:        Mood and Affect: Mood normal.      UC Treatments / Results  Labs (all labs ordered are listed, but only abnormal results are displayed) Labs Reviewed  POCT URINALYSIS DIP (DEVICE) - Abnormal; Notable for the following components:      Result Value   Hgb urine dipstick TRACE (*)    Protein, ur 30 (*)    Nitrite POSITIVE (*)    Leukocytes,Ua SMALL (*)    All other components within normal limits  URINE CULTURE    EKG None  Radiology No results found.  Procedures Procedures (including critical care time)  Medications Ordered in UC Medications - No data to display  Initial Impression / Assessment and Plan / UC Course  I have reviewed the triage vital signs and the nursing notes.  Pertinent labs & imaging results that were available during my care of the patient were reviewed by me and considered in my medical decision making (see chart for details).    UTI  Urine positive for urinary tract infection Sending for culture based on history of resistance to antibiotics. Macrobid twice Kristin [name] day for 5 days Follow up as needed for continued or worsening symptoms  Final Clinical Impressions(s) / UC Diagnoses    Final diagnoses:  Lower urinary tract infectious disease     Discharge Instructions     Your urine was positive for urinary tract infection We will treat this with Macrobid twice Kristin [name] day for 5 days Make sure you are drinking plenty of fluids to include water Follow up as needed for continued or worsening symptoms     ED Prescriptions    Medication Sig Dispense Auth. Provider   nitrofurantoin, macrocrystal-monohydrate, (MACROBID) 100 MG capsule Take 1 capsule (100 mg total) by mouth 2 (two) times daily. 10 capsule Kristin Byes A, NP     Controlled Substance Prescriptions [place] Controlled Substance Registry consulted? Not Applicable   Janace Aris, NP 08/14/18 1318

## 2018-08-14 NOTE — Discharge Instructions (Signed)
Your urine was positive for urinary tract infection We will treat this with Macrobid twice a day for 5 days Make sure you are drinking plenty of fluids to include water Follow up as needed for continued or worsening symptoms

## 2018-08-14 NOTE — ED Triage Notes (Signed)
Pt presents with urinary tract symptoms ; urinary frequency and discomfort X 7 days.

## 2018-08-16 LAB — URINE CULTURE: Culture: >=100000 — AB

## 2018-08-19 ENCOUNTER — Telehealth (HOSPITAL_COMMUNITY): Payer: Self-pay | Admitting: Emergency Medicine

## 2018-08-19 NOTE — Telephone Encounter (Signed)
Urine culture was positive for e coli and was given macrobid urgent care visit. Attempted to reach patient. No answer at this time.

## 2018-08-21 ENCOUNTER — Telehealth (HOSPITAL_COMMUNITY): Payer: Self-pay | Admitting: Emergency Medicine

## 2018-08-21 NOTE — Telephone Encounter (Signed)
Pt called stating she was feeling no better after her antibiotics. Reviewed chart with Traci, pt will need to be reseen due to still having symptoms. Pt agreeable to plan, states she will come back tomorrow.

## 2018-08-29 ENCOUNTER — Telehealth: Payer: Self-pay | Admitting: *Deleted

## 2018-08-29 ENCOUNTER — Other Ambulatory Visit: Payer: Self-pay

## 2018-08-29 ENCOUNTER — Ambulatory Visit (HOSPITAL_COMMUNITY)
Admission: EM | Admit: 2018-08-29 | Discharge: 2018-08-29 | Disposition: A | Discharge status: Home / Self Care | Payer: BLUE CROSS/BLUE SHIELD | Attending: Family Medicine | Admitting: Family Medicine

## 2018-08-29 ENCOUNTER — Encounter (HOSPITAL_COMMUNITY): Payer: Self-pay | Admitting: Emergency Medicine

## 2018-08-29 DIAGNOSIS — M5441 Lumbago with sciatica, right side: Secondary | ICD-10-CM | POA: Diagnosis present

## 2018-08-29 DIAGNOSIS — N390 Urinary tract infection, site not specified: Secondary | ICD-10-CM | POA: Diagnosis not present

## 2018-08-29 DIAGNOSIS — G8929 Other chronic pain: Secondary | ICD-10-CM | POA: Insufficient documentation

## 2018-08-29 DIAGNOSIS — M5442 Lumbago with sciatica, left side: Secondary | ICD-10-CM | POA: Diagnosis not present

## 2018-08-29 DIAGNOSIS — Z20822 Contact with and (suspected) exposure to covid-19: Secondary | ICD-10-CM

## 2018-08-29 DIAGNOSIS — R6889 Other general symptoms and signs: Secondary | ICD-10-CM | POA: Diagnosis present

## 2018-08-29 DIAGNOSIS — R509 Fever, unspecified: Secondary | ICD-10-CM | POA: Insufficient documentation

## 2018-08-29 LAB — POCT URINALYSIS DIP (DEVICE)
Bilirubin Urine: NEGATIVE
Glucose, UA: NEGATIVE mg/dL
Ketones, ur: NEGATIVE mg/dL
Nitrite: POSITIVE — AB
Protein, ur: 100 mg/dL — AB
Specific Gravity, Urine: 1.015 (ref 1.005–1.030)
Urobilinogen, UA: 2 mg/dL — ABNORMAL HIGH (ref 0.0–1.0)
pH: 7 (ref 5.0–8.0)

## 2018-08-29 MED ORDER — LIDOCAINE HCL 2 % IJ SOLN
INTRAMUSCULAR | Status: AC
  Filled 2018-08-29: qty 20

## 2018-08-29 MED ORDER — CEPHALEXIN 500 MG PO CAPS: 500.0000 mg | ORAL_CAPSULE | Freq: Three times a day (TID) | ORAL | 0 refills | Status: DC

## 2018-08-29 MED ORDER — ACETAMINOPHEN 325 MG PO TABS
ORAL_TABLET | ORAL | Status: AC
  Filled 2018-08-29: qty 2

## 2018-08-29 MED ORDER — CEFTRIAXONE SODIUM 1 G IJ SOLR
1.0000 g | Freq: Once | INTRAMUSCULAR | Status: AC
  Administered 2018-08-29: 1 g via INTRAMUSCULAR

## 2018-08-29 MED ORDER — CEFTRIAXONE SODIUM 1 G IJ SOLR
INTRAMUSCULAR | Status: AC
  Filled 2018-08-29: qty 10

## 2018-08-29 MED ORDER — ACETAMINOPHEN 325 MG PO TABS
650.0000 mg | ORAL_TABLET | Freq: Once | ORAL | Status: AC
  Administered 2018-08-29: 650 mg via ORAL

## 2018-08-29 MED ORDER — GABAPENTIN 300 MG PO CAPS: ORAL_CAPSULE | ORAL | 0 refills | Status: DC

## 2018-08-29 NOTE — Telephone Encounter (Signed)
-----   Message from Raylene Everts, MD sent at 08/29/2018  2:26 PM EDT ----- Regarding: needs COVID testing

## 2018-08-29 NOTE — Telephone Encounter (Signed)
Patient called and scheduled for testing at [facility] site on 08/30/18. Pt advised to wear a mask and remain in care at scheduled appt time. Pt verbalized understanding.

## 2018-08-29 NOTE — ED Provider Notes (Signed)
[facility]    CSN: [id number] Arrival date & time: 08/29/18  1313      History   Chief Complaint Chief Complaint  Patient presents with   Dysuria   Fever    HPI Kristin [name] is a 43 y.o. female.   HPI  Patient is here for a bladder infection.  She states she is been having bladder infection symptoms ever since she was seen earlier this month for same.  She took the antibiotics.  She felt briefly better.  Now they are back.  Urinary frequency.  Dysuria.  Lower abdominal pressure and pain.  No vaginal discharge.  No concern for STD.  No nausea or vomiting. Starting yesterday she started having body aches.  Hurts all over.  Fever.  No cough, shortness of breath.  No runny nose or sore throat.  No respiratory symptoms.  She thinks it is due to her bladder infection. She also has a chronic low back pain with sciatica.  This is flared up.  It is her typical low back pain with pain into her leg.  Past Medical History:  Diagnosis Date   Acid reflux    Anxiety    Arthritis    Depression    Eczema    Hypertension    Migraine headache     Patient Active Problem List   Diagnosis Date Noted   Suicidal ideation 08/08/2018   Vaginal irritation 03/07/2018   Acute cystitis without hematuria 11/09/2017   Injury of toe on right foot 10/16/2017   Esophagitis determined by endoscopy    Dehydration    Nausea & vomiting 09/18/2017   AKI (acute kidney injury) (HCC)    Bipolar disorder in remission (HCC)    Back pain 08/25/2016   Generalized anxiety disorder 08/23/2015   Knee pain 12/21/2014   Dysuria 04/22/2014   Tinea pedis of both feet 10/02/2013   Menorrhagia 09/04/2012   Allergic rhinitis 06/12/2012   Eczema 03/26/2012   Alcohol abuse 10/26/2010   Bipolar 1 disorder (HCC) 10/09/2010   Panic attacks 10/09/2010   INSOMNIA, CHRONIC 06/29/2009   TOBACCO USER 01/23/2009   Obesity 10/29/2008   Essential hypertension 12/20/2006     Past Surgical History:  Procedure Laterality Date   BIOPSY  09/19/2017   Procedure: BIOPSY;  Surgeon: Kathi DerBrahmbhatt, Parag, MD;  Location: [facility];  Service: Gastroenterology;;   DILITATION & CURRETTAGE/HYSTROSCOPY WITH HYDROTHERMAL ABLATION N/A 04/04/2017   Procedure: DILATATION & CURETTAGE/HYSTEROSCOPY WITH HYDROTHERMAL ABLATION;  Surgeon: Reva BoresPratt, Tanya S, MD;  Location: [facility];  Service: Gynecology;  Laterality: N/A;   ESOPHAGOGASTRODUODENOSCOPY (EGD) WITH PROPOFOL N/A 09/19/2017   Procedure: ESOPHAGOGASTRODUODENOSCOPY (EGD) WITH PROPOFOL;  Surgeon: Kathi DerBrahmbhatt, Parag, MD;  Location: [facility];  Service: Gastroenterology;  Laterality: N/A;   TUBAL LIGATION      OB History    Gravida  4   Para  3   Term  3   Preterm      AB  1   Living  3     SAB      TAB  1   Ectopic      Multiple      Live Births               Home Medications    Prior to Admission medications   Medication Sig Start Date End Date Taking? Authorizing Provider  hydrOXYzine (VISTARIL) 25 MG capsule Take 1 capsule (25 mg total) by mouth daily as needed for anxiety. 07/23/18  Yes  Cleotis Nipper, MD  lamoTRIgine (LAMICTAL) 25 MG tablet Take one tab daily for 1 week and than 2 tab daily 07/23/18  Yes Arfeen, Phillips Grout, MD  lisinopril-hydrochlorothiazide (PRINZIDE,ZESTORETIC) 10-12.5 MG tablet Take 1 tablet by mouth daily. 09/19/17  Yes Bland, Scott, DO  QUEtiapine (SEROQUEL) 100 MG tablet Take 1 tablet (100 mg total) by mouth at bedtime. 07/23/18 07/23/19 Yes Arfeen, Phillips Grout, MD  acetaminophen (TYLENOL) 500 MG tablet Take 1,000 mg by mouth every 6 (six) hours as needed for mild pain.    [provider]  cephALEXin (KEFLEX) 500 MG capsule Take 1 capsule (500 mg total) by mouth 3 (three) times daily. 08/29/18   Eustace Moore, MD  famotidine (PEPCID) 20 MG tablet Take 1 tablet (20 mg total) by mouth 2 (two) times daily. 04/17/18   Jacalyn Lefevre, MD  fluticasone (FLONASE)  50 MCG/ACT nasal spray Place 2 sprays into both nostrils daily. Patient taking differently: Place 2 sprays into both nostrils daily as needed for allergies.  02/25/17   Cathie Hoops, Amy V, PA-C  gabapentin (NEURONTIN) 300 MG capsule TAKE 1 CAPSULE BY MOUTH THREE TIMES DAILY 08/29/18   Eustace Moore, MD  promethazine (PHENERGAN) 25 MG tablet Take 1 tablet (25 mg total) by mouth every 6 (six) hours as needed for nausea or vomiting. 04/17/18 08/29/18  Jacalyn Lefevre, MD    Family History Family History  Problem Relation Age of Onset   Cancer Maternal Grandmother        Breast   Hypertension Mother    Hypertension Brother    Cancer Maternal Uncle        colon   Bipolar disorder Cousin    Schizophrenia Cousin     Social History Social History   Tobacco Use   Smoking status: Current Every Day Smoker    Packs/day: 0.25    Years: 5.00    Pack years: 1.25   Smokeless tobacco: Never Used  Substance Use Topics   Alcohol use: Yes    Comment: occassionally    Drug use: No     Allergies   Sumatriptan   Review of Systems Review of Systems  Constitutional: Positive for fatigue and fever. Negative for chills.  HENT: Negative for ear pain and sore throat.   Eyes: Negative for pain and visual disturbance.  Respiratory: Negative for cough and shortness of breath.   Cardiovascular: Negative for chest pain and palpitations.  Gastrointestinal: Negative for abdominal pain and vomiting.  Genitourinary: Positive for dysuria and frequency. Negative for hematuria.  Musculoskeletal: Positive for myalgias. Negative for arthralgias and back pain.  Skin: Negative for color change and rash.  Neurological: Negative for seizures and syncope.  All other systems reviewed and are negative.    Physical Exam Triage Vital Signs ED Triage Vitals  Enc Vitals Group     BP 08/29/18 1347 124/78     Pulse Rate 08/29/18 1347 (!) 101     Resp 08/29/18 1347 16     Temp 08/29/18 1347 (!) 103.1 F (39.5  C)     Temp Source 08/29/18 1347 Oral     SpO2 08/29/18 1347 98 %     Weight --      Height --      Head Circumference --      Peak Flow --      Pain Score 08/29/18 1344 10     Pain Loc --      Pain Edu? --      Excl.  in GC? --    No data found.  Updated Vital Signs BP 124/78    Pulse (!) 101    Temp (!) 103.1 F (39.5 C) (Oral)    Resp 16    SpO2 98%      Physical Exam Constitutional:      General: She is in acute distress.     Appearance: She is well-developed and normal weight. She is ill-appearing.  HENT:     Head: Normocephalic and atraumatic.     Nose: Nose normal. No congestion.     Mouth/Throat:     Mouth: Mucous membranes are moist.  Eyes:     Conjunctiva/sclera: Conjunctivae normal.     Pupils: Pupils are equal, round, and reactive to light.  Neck:     Musculoskeletal: Normal range of motion.  Cardiovascular:     Rate and Rhythm: Tachycardia present.  Pulmonary:     Effort: Pulmonary effort is normal. No respiratory distress.     Breath sounds: Normal breath sounds. No rhonchi or rales.  Abdominal:     General: Abdomen is flat. There is no distension.     Palpations: Abdomen is soft.     Tenderness: There is no abdominal tenderness. There is no right CVA tenderness or left CVA tenderness.  Musculoskeletal: Normal range of motion.     Comments: Mild tenderness in the SI joints.  No palpable muscle spasm strength sensation range of motion and reflexes are normal in both lower extremities  Lymphadenopathy:     Cervical: No cervical adenopathy.  Skin:    General: Skin is warm and dry.  Neurological:     General: No focal deficit present.     Mental Status: She is alert.     Sensory: No sensory deficit.     Gait: Gait normal.     Deep Tendon Reflexes: Reflexes normal.  Psychiatric:        Mood and Affect: Mood normal.        Behavior: Behavior normal.      UC Treatments / Results  Labs (all labs ordered are listed, but only abnormal results are  displayed) Labs Reviewed  POCT URINALYSIS DIP (DEVICE) - Abnormal; Notable for the following components:      Result Value   Hgb urine dipstick TRACE (*)    Protein, ur 100 (*)    Urobilinogen, UA 2.0 (*)    Nitrite POSITIVE (*)    Leukocytes,Ua SMALL (*)    All other components within normal limits  URINE CULTURE    EKG None  Radiology No results found.  Procedures Procedures (including critical care time)  Medications Ordered in UC Medications  acetaminophen (TYLENOL) tablet 650 mg (650 mg Oral Given 08/29/18 1357)  cefTRIAXone (ROCEPHIN) injection 1 g (1 g Intramuscular Given 08/29/18 1456)  acetaminophen (TYLENOL) 325 MG tablet (has no administration in time range)  cefTRIAXone (ROCEPHIN) 1 g injection (has no administration in time range)  lidocaine (XYLOCAINE) 2 % (with pres) injection (has no administration in time range)    Initial Impression / Assessment and Plan / UC Course  I have reviewed the triage vital signs and the nursing notes.  Pertinent labs & imaging results that were available during my care of the patient were reviewed by me and considered in my medical decision making (see chart for details).  Clinical Course as of Aug 28 2053  Thu Aug 29, 2018  1412 POCT Urinalysis, Dipstick [YN]    Clinical Course User Index [YN] Delton See,  Letta PateYvonne Sue, MD   I explained to the patient that cystitis does not cause a high fever that she has.  She does not have CVA tenderness or abdominal tenderness, vomiting like you would see with a pyelonephritis.  I worry that the fever and body aches could be COVID-19.  Going to give her a shot of Rocephin followed with Keflex as an antibiotic for the urinary tract.  I am sending her for COVID-19 testing tomorrow.  She understands that she has to restrict her activities and stay home until her cover test is available.  Call for problems Pension refill provided at her request Final Clinical Impressions(s) / UC Diagnoses   Final  diagnoses:  Lower urinary tract infectious disease  Fever, unspecified fever cause  Suspected Covid-19 Virus Infection     Discharge Instructions     You need to drink plenty of fluids Take antibiotic 3 times a day Take Tylenol for pain and fever Rest You need to go for coronavirus testing tomorrow You need to stay home on isolation until your coronavirus test is available, you will be called     Person Under Monitoring Name: Kristin [name]  Location[place] [id number] [place] [id number]   Infection Prevention Recommendations for Individuals Confirmed to have, or Being Evaluated for, 2019 Novel Coronavirus (COVID-19) Infection Who Receive Care at Home  Individuals who are confirmed to have, or are being evaluated for, COVID-19 should follow the prevention steps below until a healthcare provider or local or state health department says they can return to normal activities.  Stay home except to get medical care You should restrict activities outside your home, except for getting medical care. Do not go to work, school, or public areas, and do not use public transportation or taxis.  Call ahead before visiting your doctor Before your medical appointment, call the healthcare provider and tell them that you have, or are being evaluated for, COVID-19 infection. This will help the healthcare providers office take steps to keep other people from getting infected. Ask your healthcare provider to call the local or state health department.  Monitor your symptoms Seek prompt medical attention if your illness is worsening (e.g., difficulty breathing). Before going to your medical appointment, call the healthcare provider and tell them that you have, or are being evaluated for, COVID-19 infection. Ask your healthcare provider to call the local or state health department.  Wear a facemask You should wear a facemask that covers your nose and mouth when you are in the same room  with other people and when you visit a healthcare provider. People who live with or visit you should also wear a facemask while they are in the same room with you.  Separate yourself from other people in your home As much as possible, you should stay in a different room from other people in your home. Also, you should use a separate bathroom, if available.  Avoid sharing household items You should not share dishes, drinking glasses, cups, eating utensils, towels, bedding, or other items with other people in your home. After using these items, you should wash them thoroughly with soap and water.  Cover your coughs and sneezes Cover your mouth and nose with a tissue when you cough or sneeze, or you can cough or sneeze into your sleeve. Throw used tissues in a lined trash can, and immediately wash your hands with soap and water for at least 20 seconds or use an alcohol-based hand rub.  Wash your  hands Wash your hands often and thoroughly with soap and water for at least 20 seconds. You can use an alcohol-based hand sanitizer if soap and water are not available and if your hands are not visibly dirty. Avoid touching your eyes, nose, and mouth with unwashed hands.   Prevention Steps for Caregivers and Household Members of Individuals Confirmed to have, or Being Evaluated for, COVID-19 Infection Being Cared for in the Home  If you live with, or provide care at home for, a person confirmed to have, or being evaluated for, COVID-19 infection please follow these guidelines to prevent infection:  Follow healthcare providers instructions Make sure that you understand and can help the patient follow any healthcare provider instructions for all care.  Provide for the patients basic needs You should help the patient with basic needs in the home and provide support for getting groceries, prescriptions, and other personal needs.  Monitor the patients symptoms If they are getting sicker, call  his or her medical provider and tell them that the patient has, or is being evaluated for, COVID-19 infection. This will help the healthcare providers office take steps to keep other people from getting infected. Ask the healthcare provider to call the local or state health department.  Limit the number of people who have contact with the patient  If possible, have only one caregiver for the patient.  Other household members should stay in another home or place of residence. If this is not possible, they should stay  in another room, or be separated from the patient as much as possible. Use a separate bathroom, if available.  Restrict visitors who do not have an essential need to be in the home.  Keep older adults, very young children, and other sick people away from the patient Keep older adults, very young children, and those who have compromised immune systems or chronic health conditions away from the patient. This includes people with chronic heart, lung, or kidney conditions, diabetes, and cancer.  Ensure good ventilation Make sure that shared spaces in the home have good air flow, such as from an air conditioner or an opened window, weather permitting.  Wash your hands often  Wash your hands often and thoroughly with soap and water for at least 20 seconds. You can use an alcohol based hand sanitizer if soap and water are not available and if your hands are not visibly dirty.  Avoid touching your eyes, nose, and mouth with unwashed hands.  Use disposable paper towels to dry your hands. If not available, use dedicated cloth towels and replace them when they become wet.  Wear a facemask and gloves  Wear a disposable facemask at all times in the room and gloves when you touch or have contact with the patients blood, body fluids, and/or secretions or excretions, such as sweat, saliva, sputum, nasal mucus, vomit, urine, or feces.  Ensure the mask fits over your nose and mouth  tightly, and do not touch it during use.  Throw out disposable facemasks and gloves after using them. Do not reuse.  Wash your hands immediately after removing your facemask and gloves.  If your personal clothing becomes contaminated, carefully remove clothing and launder. Wash your hands after handling contaminated clothing.  Place all used disposable facemasks, gloves, and other waste in a lined container before disposing them with other household waste.  Remove gloves and wash your hands immediately after handling these items.  Do not share dishes, glasses, or other household items with the  patient  Avoid sharing household items. You should not share dishes, drinking glasses, cups, eating utensils, towels, bedding, or other items with a patient who is confirmed to have, or being evaluated for, COVID-19 infection.  After the person uses these items, you should wash them thoroughly with soap and water.  Wash laundry thoroughly  Immediately remove and wash clothes or bedding that have blood, body fluids, and/or secretions or excretions, such as sweat, saliva, sputum, nasal mucus, vomit, urine, or feces, on them.  Wear gloves when handling laundry from the patient.  Read and follow directions on labels of laundry or clothing items and detergent. In general, wash and dry with the warmest temperatures recommended on the label.  Clean all areas the individual has used often  Clean all touchable surfaces, such as counters, tabletops, doorknobs, bathroom fixtures, toilets, phones, keyboards, tablets, and bedside tables, every day. Also, clean any surfaces that may have blood, body fluids, and/or secretions or excretions on them.  Wear gloves when cleaning surfaces the patient has come in contact with.  Use a diluted bleach solution (e.g., dilute bleach with 1 part bleach and 10 parts water) or a household disinfectant with a label that says EPA-registered for coronaviruses. To make a bleach  solution at home, add 1 tablespoon of bleach to 1 quart (4 cups) of water. For a larger supply, add  cup of bleach to 1 gallon (16 cups) of water.  Read labels of cleaning products and follow recommendations provided on product labels. Labels contain instructions for safe and effective use of the cleaning product including precautions you should take when applying the product, such as wearing gloves or eye protection and making sure you have good ventilation during use of the product.  Remove gloves and wash hands immediately after cleaning.  Monitor yourself for signs and symptoms of illness Caregivers and household members are considered close contacts, should monitor their health, and will be asked to limit movement outside of the home to the extent possible. Follow the monitoring steps for close contacts listed on the symptom monitoring form.   ? If you have additional questions, contact your local health department or call the epidemiologist on call at [phone] (available 24/7). ? This guidance is subject to change. For the most up-to-date guidance from [facility], please refer to their website: TripMetro.huhttps://www.cdc.gov/coronavirus/2019-ncov/hcp/guidance-prevent-spread.html    ED Prescriptions    Medication Sig Dispense Auth. Provider   cephALEXin (KEFLEX) 500 MG capsule Take 1 capsule (500 mg total) by mouth 3 (three) times daily. 30 capsule Eustace MooreNelson, Harman Ferrin Sue, MD   gabapentin (NEURONTIN) 300 MG capsule TAKE 1 CAPSULE BY MOUTH THREE TIMES DAILY 90 capsule Eustace MooreNelson, Audriana Aldama Sue, MD     Controlled Substance Prescriptions [place] Controlled Substance Registry consulted? Not Applicable   Eustace MooreNelson, Saurabh Hettich Sue, MD 08/29/18 2059

## 2018-08-29 NOTE — ED Triage Notes (Signed)
PT was seen June 3 for UTI symptoms and given antibiotics. PT reports she took full course and is not better. PT reports continued dysuria and frequency. New symptoms are back and leg pain. PT is having issues with incontinence due to frequency

## 2018-08-29 NOTE — Discharge Instructions (Signed)
You need to drink plenty of fluids Take antibiotic 3 times a day Take Tylenol for pain and fever Rest You need to go for coronavirus testing tomorrow You need to stay home on isolation until your coronavirus test is available, you will be called     Person Under Monitoring Name: Kristin [name]  Location: [address] [place] [address]   Infection Prevention Recommendations for Individuals Confirmed to have, or Being Evaluated for, 2019 Novel Coronavirus (COVID-19) Infection Who Receive Care at Home  Individuals who are confirmed to have, or are being evaluated for, COVID-19 should follow the prevention steps below until a healthcare provider or local or state health department says they can return to normal activities.  Stay home except to get medical care You should restrict activities outside your home, except for getting medical care. Do not go to work, school, or public areas, and do not use public transportation or taxis.  Call ahead before visiting your doctor Before your medical appointment, call the healthcare provider and tell them that you have, or are being evaluated for, COVID-19 infection. This will help the healthcare providers office take steps to keep other people from getting infected. Ask your healthcare provider to call the local or state health department.  Monitor your symptoms Seek prompt medical attention if your illness is worsening (e.g., difficulty breathing). Before going to your medical appointment, call the healthcare provider and tell them that you have, or are being evaluated for, COVID-19 infection. Ask your healthcare provider to call the local or state health department.  Wear a facemask You should wear a facemask that covers your nose and mouth when you are in the same room with other people and when you visit a healthcare provider. People who live with or visit you should also wear a facemask while they are in the same room  with you.  Separate yourself from other people in your home As much as possible, you should stay in a different room from other people in your home. Also, you should use a separate bathroom, if available.  Avoid sharing household items You should not share dishes, drinking glasses, cups, eating utensils, towels, bedding, or other items with other people in your home. After using these items, you should wash them thoroughly with soap and water.  Cover your coughs and sneezes Cover your mouth and nose with a tissue when you cough or sneeze, or you can cough or sneeze into your sleeve. Throw used tissues in a lined trash can, and immediately wash your hands with soap and water for at least 20 seconds or use an alcohol-based hand rub.  Wash your [employer] your hands often and thoroughly with soap and water for at least 20 seconds. You can use an alcohol-based hand sanitizer if soap and water are not available and if your hands are not visibly dirty. Avoid touching your eyes, nose, and mouth with unwashed hands.   Prevention Steps for Caregivers and Household Members of Individuals Confirmed to have, or Being Evaluated for, COVID-19 Infection Being Cared for in the Home  If you live with, or provide care at home for, a person confirmed to have, or being evaluated for, COVID-19 infection please follow these guidelines to prevent infection:  Follow healthcare providers instructions Make sure that you understand and can help the patient follow any healthcare provider instructions for all care.  Provide for the patients basic needs You should help the patient with basic needs in the home and  provide support for getting groceries, prescriptions, and other personal needs.  Monitor the patients symptoms If they are getting sicker, call his or her medical provider and tell them that the patient has, or is being evaluated for, COVID-19 infection. This will help the healthcare providers  office take steps to keep other people from getting infected. Ask the healthcare provider to call the local or state health department.  Limit the number of people who have contact with the patient If possible, have only one caregiver for the patient. Other household members should stay in another home or place of residence. If this is not possible, they should stay in another room, or be separated from the patient as much as possible. Use a separate bathroom, if available. Restrict visitors who do not have an essential need to be in the home.  Keep older adults, very young children, and other sick people away from the patient Keep older adults, very young children, and those who have compromised immune systems or chronic health conditions away from the patient. This includes people with chronic heart, lung, or kidney conditions, diabetes, and cancer.  Ensure good ventilation Make sure that shared spaces in the home have good air flow, such as from an air conditioner or an opened window, weather permitting.  Wash your hands often Wash your hands often and thoroughly with soap and water for at least 20 seconds. You can use an alcohol based hand sanitizer if soap and water are not available and if your hands are not visibly dirty. Avoid touching your eyes, nose, and mouth with unwashed hands. Use disposable paper towels to dry your hands. If not available, use dedicated cloth towels and replace them when they become wet.  Wear a facemask and gloves Wear a disposable facemask at all times in the room and gloves when you touch or have contact with the patients blood, body fluids, and/or secretions or excretions, such as sweat, saliva, sputum, nasal mucus, vomit, urine, or feces.  Ensure the mask fits over your nose and mouth tightly, and do not touch it during use. Throw out disposable facemasks and gloves after using them. Do not reuse. Wash your hands immediately after removing your facemask  and gloves. If your personal clothing becomes contaminated, carefully remove clothing and launder. Wash your hands after handling contaminated clothing. Place all used disposable facemasks, gloves, and other waste in a lined container before disposing them with other household waste. Remove gloves and wash your hands immediately after handling these items.  Do not share dishes, glasses, or other household items with the patient Avoid sharing household items. You should not share dishes, drinking glasses, cups, eating utensils, towels, bedding, or other items with a patient who is confirmed to have, or being evaluated for, COVID-19 infection. After the person uses these items, you should wash them thoroughly with soap and water.  Wash laundry thoroughly Immediately remove and wash clothes or bedding that have blood, body fluids, and/or secretions or excretions, such as sweat, saliva, sputum, nasal mucus, vomit, urine, or feces, on them. Wear gloves when handling laundry from the patient. Read and follow directions on labels of laundry or clothing items and detergent. In general, wash and dry with the warmest temperatures recommended on the label.  Clean all areas the individual has used often Clean all touchable surfaces, such as counters, tabletops, doorknobs, bathroom fixtures, toilets, phones, keyboards, tablets, and bedside tables, every day. Also, clean any surfaces that may have blood, body fluids, and/or secretions or  excretions on them. Wear gloves when cleaning surfaces the patient has come in contact with. Use a diluted bleach solution (e.g., dilute bleach with 1 part bleach and 10 parts water) or a household disinfectant with a label that says EPA-registered for coronaviruses. To make a bleach solution at home, add 1 tablespoon of bleach to 1 quart (4 cups) of water. For a larger supply, add  cup of bleach to 1 gallon (16 cups) of water. Read labels of cleaning products and follow  recommendations provided on product labels. Labels contain instructions for safe and effective use of the cleaning product including precautions you should take when applying the product, such as wearing gloves or eye protection and making sure you have good ventilation during use of the product. Remove gloves and wash hands immediately after cleaning.  Monitor yourself for signs and symptoms of illness Caregivers and household members are considered close contacts, should monitor their health, and will be asked to limit movement outside of the home to the extent possible. Follow the monitoring steps for close contacts listed on the symptom monitoring form.   ? If you have additional questions, contact your local health department or call the epidemiologist on call at [phone] (available 24/7). ? This guidance is subject to change. For the most up-to-date guidance from [facility], please refer to their website: [email or web address]

## 2018-08-30 ENCOUNTER — Other Ambulatory Visit: Payer: Self-pay

## 2018-08-30 DIAGNOSIS — Z20822 Contact with and (suspected) exposure to covid-19: Secondary | ICD-10-CM

## 2018-08-30 LAB — URINE CULTURE

## 2018-09-05 LAB — NOVEL CORONAVIRUS, NAA: SARS-CoV-2, NAA: NOT DETECTED

## 2018-09-06 ENCOUNTER — Telehealth (HOSPITAL_COMMUNITY): Payer: Self-pay | Admitting: Emergency Medicine

## 2018-09-06 NOTE — Telephone Encounter (Signed)
Your test for COVID-19 was negative.  Please continue good preventive care measures, including:  frequent hand-washing, avoid touching your face, cover coughs/sneezes, stay out of crowds and keep a 6 foot distance from others.  If you develop fever/cough/breathlessness, please stay home for 10 days and until you have had 3 consecutive days with cough/breathlessness improving and without fever (without taking a fever reducer). Go to the nearest hospital ED tent for assessment if fever/cough/breathlessness are severe or illness seems like a threat to life..  Patient contacted and made aware of all results, all questions answered.  

## 2018-09-18 ENCOUNTER — Other Ambulatory Visit: Payer: Self-pay

## 2018-09-18 ENCOUNTER — Encounter (HOSPITAL_COMMUNITY): Payer: Self-pay | Admitting: Psychiatry

## 2018-09-18 ENCOUNTER — Ambulatory Visit (INDEPENDENT_AMBULATORY_CARE_PROVIDER_SITE_OTHER): Payer: BLUE CROSS/BLUE SHIELD | Admitting: Psychiatry

## 2018-09-18 DIAGNOSIS — F411 Generalized anxiety disorder: Secondary | ICD-10-CM

## 2018-09-18 DIAGNOSIS — F419 Anxiety disorder, unspecified: Secondary | ICD-10-CM

## 2018-09-18 DIAGNOSIS — F319 Bipolar disorder, unspecified: Secondary | ICD-10-CM

## 2018-09-18 MED ORDER — HYDROXYZINE PAMOATE 25 MG PO CAPS: 25.0000 mg | ORAL_CAPSULE | Freq: Three times a day (TID) | ORAL | 1 refills | Status: DC | PRN

## 2018-09-18 MED ORDER — OLANZAPINE 5 MG PO TBDP: 5.0000 mg | ORAL_TABLET | Freq: Every day | ORAL | 0 refills | Status: DC
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
## 2018-09-25 ENCOUNTER — Other Ambulatory Visit: Payer: Self-pay

## 2018-09-25 ENCOUNTER — Ambulatory Visit (HOSPITAL_COMMUNITY): Payer: BLUE CROSS/BLUE SHIELD | Admitting: Licensed Clinical Social Worker

## 2018-09-30 ENCOUNTER — Other Ambulatory Visit: Payer: Self-pay | Admitting: Family Medicine

## 2018-09-30 DIAGNOSIS — M5442 Lumbago with sciatica, left side: Secondary | ICD-10-CM

## 2018-09-30 DIAGNOSIS — G8929 Other chronic pain: Secondary | ICD-10-CM

## 2018-10-14 ENCOUNTER — Encounter (HOSPITAL_COMMUNITY): Payer: Self-pay | Admitting: Psychiatry

## 2018-10-14 ENCOUNTER — Ambulatory Visit (INDEPENDENT_AMBULATORY_CARE_PROVIDER_SITE_OTHER): Payer: BLUE CROSS/BLUE SHIELD | Admitting: Psychiatry

## 2018-10-14 ENCOUNTER — Other Ambulatory Visit: Payer: Self-pay

## 2018-10-14 DIAGNOSIS — F419 Anxiety disorder, unspecified: Secondary | ICD-10-CM | POA: Diagnosis not present

## 2018-10-14 DIAGNOSIS — F1010 Alcohol abuse, uncomplicated: Secondary | ICD-10-CM

## 2018-10-14 DIAGNOSIS — F319 Bipolar disorder, unspecified: Secondary | ICD-10-CM | POA: Diagnosis not present

## 2018-10-14 MED ORDER — HYDROXYZINE PAMOATE 25 MG PO CAPS: 25.0000 mg | ORAL_CAPSULE | Freq: Every day | ORAL | 1 refills | Status: DC | PRN

## 2018-10-14 MED ORDER — OLANZAPINE 5 MG PO TBDP: 5.0000 mg | ORAL_TABLET | Freq: Every day | ORAL | 1 refills | Status: DC
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
## 2019-01-12 ENCOUNTER — Ambulatory Visit (HOSPITAL_COMMUNITY)
Admission: EM | Admit: 2019-01-12 | Discharge: 2019-01-12 | Disposition: A | Discharge status: Home / Self Care | Payer: Self-pay | Attending: Internal Medicine | Admitting: Internal Medicine

## 2019-01-12 ENCOUNTER — Other Ambulatory Visit: Payer: Self-pay

## 2019-01-12 ENCOUNTER — Encounter (HOSPITAL_COMMUNITY): Payer: Self-pay

## 2019-01-12 ENCOUNTER — Ambulatory Visit (INDEPENDENT_AMBULATORY_CARE_PROVIDER_SITE_OTHER): Payer: Self-pay

## 2019-01-12 DIAGNOSIS — N3001 Acute cystitis with hematuria: Secondary | ICD-10-CM | POA: Insufficient documentation

## 2019-01-12 DIAGNOSIS — M25561 Pain in right knee: Secondary | ICD-10-CM | POA: Insufficient documentation

## 2019-01-12 MED ORDER — KETOROLAC TROMETHAMINE 30 MG/ML IJ SOLN
30.0000 mg | Freq: Once | INTRAMUSCULAR | Status: AC
  Administered 2019-01-12: 30 mg via INTRAMUSCULAR

## 2019-01-12 MED ORDER — TRAMADOL HCL 50 MG PO TABS: 50.0000 mg | ORAL_TABLET | Freq: Four times a day (QID) | ORAL | 0 refills | Status: DC | PRN

## 2019-01-12 MED ORDER — NAPROXEN 375 MG PO TABS: 375.0000 mg | ORAL_TABLET | Freq: Two times a day (BID) | ORAL | 0 refills | Status: DC

## 2019-01-12 MED ORDER — DEXAMETHASONE SODIUM PHOSPHATE 10 MG/ML IJ SOLN
INTRAMUSCULAR | Status: AC
  Filled 2019-01-12: qty 1

## 2019-01-12 MED ORDER — CIPROFLOXACIN HCL 500 MG PO TABS: 500.0000 mg | ORAL_TABLET | Freq: Two times a day (BID) | ORAL | 0 refills | Status: DC

## 2019-01-12 MED ORDER — CEPHALEXIN 500 MG PO CAPS: 500.0000 mg | ORAL_CAPSULE | Freq: Four times a day (QID) | ORAL | 0 refills | Status: DC

## 2019-01-12 MED ORDER — DEXAMETHASONE SODIUM PHOSPHATE 10 MG/ML IJ SOLN
10.0000 mg | Freq: Once | INTRAMUSCULAR | Status: AC
  Administered 2019-01-12: 10 mg via INTRAMUSCULAR

## 2019-01-12 MED ORDER — KETOROLAC TROMETHAMINE 30 MG/ML IJ SOLN
INTRAMUSCULAR | Status: AC
  Filled 2019-01-12: qty 1

## 2019-01-12 MED ORDER — FLUCONAZOLE 150 MG PO TABS: 150.0000 mg | ORAL_TABLET | Freq: Once | ORAL | 0 refills | Status: AC

## 2019-01-12 NOTE — ED Triage Notes (Signed)
Pt Present right knee pain and swelling. She also C/O urinary frequency. Symptoms started on Thursday. Pt has completed antibiotics for her UTI but symptom has return.

## 2019-01-12 NOTE — ED Provider Notes (Addendum)
[facility]    CSN: [id number] Arrival date & time: 01/12/19  1121      History   Chief Complaint Chief Complaint  Patient presents with  . Knee Pain  . Urinary Tract Infection    HPI Kristin [name] is a 43 y.o. female comes to urgent care with complaint of severe right knee pain that started few days ago.  Patient had ACL injury several years ago and was supposed to have knee surgery at that time but not been able to get the knee surgery done.  Knee pain started spontaneously and is gotten progressively worse.  Pain is currently sharp, severe-10/10.  Patient is using crutches and cannot bear weight.  No fever or chills.  Pain is worse with movement.  No known relieving factors.  No radiation of pain. It is associated with left knee swelling without erythema.   HPI  Past Medical History:  Diagnosis Date  . Acid reflux   . Anxiety   . Arthritis   . Depression   . Eczema   . Hypertension   . Migraine headache     Patient Active Problem List   Diagnosis Date Noted  . Suicidal ideation 08/08/2018  . Vaginal irritation 03/07/2018  . Acute cystitis without hematuria 11/09/2017  . Injury of toe on right foot 10/16/2017  . Esophagitis determined by endoscopy   . Dehydration   . Nausea & vomiting 09/18/2017  . AKI (acute kidney injury) (HCC)   . Bipolar disorder in remission (HCC)   . Back pain 08/25/2016  . Generalized anxiety disorder 08/23/2015  . Knee pain 12/21/2014  . Dysuria 04/22/2014  . Tinea pedis of both feet 10/02/2013  . Menorrhagia 09/04/2012  . Allergic rhinitis 06/12/2012  . Eczema 03/26/2012  . Alcohol abuse 10/26/2010  . Bipolar 1 disorder (HCC) 10/09/2010  . Panic attacks 10/09/2010  . INSOMNIA, CHRONIC 06/29/2009  . TOBACCO USER 01/23/2009  . Obesity 10/29/2008  . Essential hypertension 12/20/2006    Past Surgical History:  Procedure Laterality Date  . BIOPSY  09/19/2017   Procedure: BIOPSY;  Surgeon: Kathi Der, MD;   Location: [facility];  Service: Gastroenterology;;  . [name] & CURRETTAGE/HYSTROSCOPY WITH HYDROTHERMAL ABLATION N/A 04/04/2017   Procedure: DILATATION & CURETTAGE/HYSTEROSCOPY WITH HYDROTHERMAL ABLATION;  Surgeon: Reva Bores, MD;  Location: [facility];  Service: Gynecology;  Laterality: N/A;  . ESOPHAGOGASTRODUODENOSCOPY (EGD) WITH PROPOFOL N/A 09/19/2017   Procedure: ESOPHAGOGASTRODUODENOSCOPY (EGD) WITH PROPOFOL;  Surgeon: Kathi Der, MD;  Location: [facility];  Service: Gastroenterology;  Laterality: N/A;  . TUBAL LIGATION      OB History    Gravida  4   Para  3   Term  3   Preterm      AB  1   Living  3     SAB      TAB  1   Ectopic      Multiple      Live Births               Home Medications    Prior to Admission medications   Medication Sig Start Date End Date Taking? Authorizing Provider  acetaminophen (TYLENOL) 500 MG tablet Take 1,000 mg by mouth every 6 (six) hours as needed for mild pain.    [provider]  cephALEXin (KEFLEX) 500 MG capsule Take 1 capsule (500 mg total) by mouth 4 (four) times daily. 01/12/19   Merrilee Jansky, MD  famotidine (PEPCID) 20 MG  tablet Take 1 tablet (20 mg total) by mouth 2 (two) times daily. 04/17/18   Jacalyn Lefevre, MD  fluticasone (FLONASE) 50 MCG/ACT nasal spray Place 2 sprays into both nostrils daily. Patient taking differently: Place 2 sprays into both nostrils daily as needed for allergies.  02/25/17   Cathie Hoops, Amy V, PA-C  gabapentin (NEURONTIN) 300 MG capsule TAKE 1 CAPSULE BY MOUTH AT BEDTIME, MAY TAKE UP TO 3 TIMES DAILY IF NO IMPROVEMENT. 09/30/18   Marthenia Rolling, DO  hydrOXYzine (VISTARIL) 25 MG capsule Take 1 capsule (25 mg total) by mouth daily as needed for anxiety. 10/14/18   Arfeen, Phillips Grout, MD  lisinopril-hydrochlorothiazide (PRINZIDE,ZESTORETIC) 10-12.5 MG tablet Take 1 tablet by mouth daily. 09/19/17   Marthenia Rolling, DO  naproxen (NAPROSYN) 375 MG tablet Take 1 tablet (375  mg total) by mouth 2 (two) times daily. 01/12/19   Naavya Postma, Britta Mccreedy, MD  OLANZapine zydis (ZYPREXA ZYDIS) 5 MG disintegrating tablet Take 1 tablet (5 mg total) by mouth at bedtime. 10/14/18   Arfeen, Phillips Grout, MD  traMADol (ULTRAM) 50 MG tablet Take 1 tablet (50 mg total) by mouth every 6 (six) hours as needed. 01/12/19   LampteyBritta Mccreedy, MD  promethazine (PHENERGAN) 25 MG tablet Take 1 tablet (25 mg total) by mouth every 6 (six) hours as needed for nausea or vomiting. 04/17/18 08/29/18  Jacalyn Lefevre, MD    Family History Family History  Problem Relation Age of Onset  . Cancer Maternal Grandmother        Breast  . Hypertension Mother   . Hypertension Brother   . Cancer Maternal Uncle        colon  . Bipolar disorder Cousin   . Schizophrenia Cousin     Social History Social History   Tobacco Use  . Smoking status: Current Every Day Smoker    Packs/day: 0.25    Years: 5.00    Pack years: 1.25  . Smokeless tobacco: Never Used  Substance Use Topics  . Alcohol use: Yes    Comment: occassionally   . Drug use: No     Allergies   Sumatriptan   Review of Systems Review of Systems  Constitutional: Positive for activity change. Negative for chills, fatigue and fever.  HENT: Negative.   Respiratory: Negative.   Cardiovascular: Negative.   Gastrointestinal: Negative.   Genitourinary: Negative.   Musculoskeletal: Positive for arthralgias, gait problem and joint swelling. Negative for back pain, myalgias, neck pain and neck stiffness.  Skin: Negative.   Neurological: Negative for dizziness, weakness and light-headedness.  Psychiatric/Behavioral: Negative for confusion and decreased concentration.     Physical Exam Triage Vital Signs ED Triage Vitals  Enc Vitals Group     BP 01/12/19 1207 138/84     Pulse Rate 01/12/19 1207 85     Resp 01/12/19 1207 16     Temp 01/12/19 1207 98.1 F (36.7 C)     Temp Source 01/12/19 1207 Temporal     SpO2 01/12/19 1207 97 %     Weight --       Height --      Head Circumference --      Peak Flow --      Pain Score 01/12/19 1212 10     Pain Loc --      Pain Edu? --      Excl. in GC? --    No data found.  Updated Vital Signs BP 138/84 (BP Location: Left Arm)   Pulse 85  Temp 98.1 F (36.7 C) (Temporal)   Resp 16   SpO2 97%   Visual Acuity Right Eye Distance:   Left Eye Distance:   Bilateral Distance:    Right Eye Near:   Left Eye Near:    Bilateral Near:     Physical Exam Vitals signs and nursing note reviewed.  Constitutional:      General: She is in acute distress.     Appearance: She is ill-appearing. She is not toxic-appearing.  HENT:     Mouth/Throat:     Mouth: Mucous membranes are moist.     Pharynx: No oropharyngeal exudate or posterior oropharyngeal erythema.  Eyes:     Conjunctiva/sclera: Conjunctivae normal.  Neck:     Musculoskeletal: Normal range of motion and neck supple.  Cardiovascular:     Rate and Rhythm: Normal rate and regular rhythm.     Pulses: Normal pulses.     Heart sounds: Normal heart sounds.  Pulmonary:     Effort: Pulmonary effort is normal. No respiratory distress.     Breath sounds: Normal breath sounds. No stridor. No rhonchi or rales.  Abdominal:     General: Bowel sounds are normal. There is no distension.     Tenderness: There is no abdominal tenderness. There is no rebound.  Musculoskeletal: Normal range of motion.        General: Swelling and tenderness present. No deformity or signs of injury.     Right lower leg: No edema.     Left lower leg: No edema.     Comments: Painful swelling of the right knee. Patella is not ballotable.  Skin:    General: Skin is warm.     Capillary Refill: Capillary refill takes less than 2 seconds.     Findings: No bruising, erythema or lesion.  Neurological:     General: No focal deficit present.     Mental Status: She is alert and oriented to person, place, and time.      UC Treatments / Results  Labs (all labs  ordered are listed, but only abnormal results are displayed) Labs Reviewed  URINE CULTURE    EKG   Radiology No results found.  Procedures Procedures (including critical care time)  Medications Ordered in UC Medications  ketorolac (TORADOL) 30 MG/ML injection 30 mg (30 mg Intramuscular Given 01/12/19 1246)  dexamethasone (DECADRON) injection 10 mg (10 mg Intramuscular Given 01/12/19 1246)  ketorolac (TORADOL) 30 MG/ML injection (has no administration in time range)  dexamethasone (DECADRON) 10 MG/ML injection (has no administration in time range)  ketorolac (TORADOL) 30 MG/ML injection (has no administration in time range)  dexamethasone (DECADRON) 10 MG/ML injection (has no administration in time range)    Initial Impression / Assessment and Plan / UC Course  I have reviewed the triage vital signs and the nursing notes.  Pertinent labs & imaging results that were available during my care of the patient were reviewed by me and considered in my medical decision making (see chart for details).     1.  Painful right knee effusion: X-ray of the right knee showed soft tissue edema and a small suprapatellar effusion. Mild degenerative changes. Toradol 30 mg IM x1 dose Dexamethasone 10 mg IM x1 dose Short course of prednisone at discharge We will prescribe tramadol for breakthrough pain and naproxen twice daily with food Patient will need orthopedic follow-up for knee evaluation if there is no improvement.  2.  Acute cystitis with hematuria: Urinalysis was positive for leukocyte  Estrace, protein, nitrite, blood Urine cultures have been sent Prior to urine cultures grew E. coli sensitive to cefazolin Patient will be prescribed Keflex 500 mg 4 times daily for 5 days We will call patient with urine culture results once available. Final Clinical Impressions(s) / UC Diagnoses   Final diagnoses:  Acute pain of right knee  Acute cystitis with hematuria   Discharge Instructions    None    ED Prescriptions    Medication Sig Dispense Auth. Provider   naproxen (NAPROSYN) 375 MG tablet Take 1 tablet (375 mg total) by mouth 2 (two) times daily. 20 tablet Rhonda Vangieson, Myrene Galas, MD   traMADol (ULTRAM) 50 MG tablet Take 1 tablet (50 mg total) by mouth every 6 (six) hours as needed. 15 tablet Afshin Chrystal, Myrene Galas, MD   ciprofloxacin (CIPRO) 500 MG tablet  (Status: Discontinued) Take 1 tablet (500 mg total) by mouth every 12 (twelve) hours. 10 tablet Daisha Filosa, Myrene Galas, MD   cephALEXin (KEFLEX) 500 MG capsule Take 1 capsule (500 mg total) by mouth 4 (four) times daily. 20 capsule Octavious Zidek, Myrene Galas, MD     I have reviewed the PDMP during this encounter.   Chase Picket, MD 01/12/19 1329    Chase Picket, MD 01/24/19 [phone]

## 2019-01-13 LAB — URINE CULTURE: Special Requests: NORMAL

## 2019-01-13 LAB — POCT URINALYSIS DIP (DEVICE)
Glucose, UA: NEGATIVE mg/dL
Nitrite: POSITIVE — AB
Protein, ur: 100 mg/dL — AB
Specific Gravity, Urine: 1.025 (ref 1.005–1.030)
Urobilinogen, UA: 0.2 mg/dL (ref 0.0–1.0)
pH: 5.5 (ref 5.0–8.0)

## 2019-01-15 ENCOUNTER — Other Ambulatory Visit: Payer: Self-pay

## 2019-01-15 ENCOUNTER — Encounter (HOSPITAL_COMMUNITY): Payer: BLUE CROSS/BLUE SHIELD | Admitting: Psychiatry

## 2019-01-15 ENCOUNTER — Telehealth (HOSPITAL_COMMUNITY): Payer: Self-pay | Admitting: Psychiatry

## 2019-01-15 NOTE — Progress Notes (Signed)
No show

## 2019-01-15 NOTE — Telephone Encounter (Signed)
No show. Patient did not pick up the phone. Message left to call back to r/s appointment.

## 2019-01-16 ENCOUNTER — Other Ambulatory Visit: Payer: Self-pay

## 2019-01-16 ENCOUNTER — Encounter (HOSPITAL_COMMUNITY): Payer: Self-pay

## 2019-01-16 ENCOUNTER — Ambulatory Visit (HOSPITAL_COMMUNITY)
Admission: EM | Admit: 2019-01-16 | Discharge: 2019-01-16 | Disposition: A | Discharge status: Home / Self Care | Payer: Self-pay | Attending: Internal Medicine | Admitting: Internal Medicine

## 2019-01-16 DIAGNOSIS — M25561 Pain in right knee: Secondary | ICD-10-CM

## 2019-01-16 MED ORDER — NAPROXEN 375 MG PO TABS: 375.0000 mg | ORAL_TABLET | Freq: Two times a day (BID) | ORAL | 0 refills | Status: DC

## 2019-01-16 NOTE — ED Provider Notes (Signed)
[place]    CSN: [id number] Arrival date & time: 01/16/19  1419      History   Chief Complaint Chief Complaint  Patient presents with  . follow up right knee inj    HPI Kristin [name] is a 43 y.o. female recently seen for painful right knee effusion comes to urgent care with complaints of persistent right knee pain and a request to be given some days off of work to recover.  Patient has history of collateral ligament sprain sustained in August 2011  she was supposed to have been evaluated by orthopedic surgery for knee surgery but has not been able to get to work.  Knee swelling is better compared to 4 days ago.  Pain is still of moderate severity.  No fever or chills.  No numbness or tingling.  HPI  Past Medical History:  Diagnosis Date  . Acid reflux   . Anxiety   . Arthritis   . Depression   . Eczema   . Hypertension   . Migraine headache     Patient Active Problem List   Diagnosis Date Noted  . Suicidal ideation 08/08/2018  . Vaginal irritation 03/07/2018  . Acute cystitis without hematuria 11/09/2017  . Injury of toe on right foot 10/16/2017  . Esophagitis determined by endoscopy   . Dehydration   . Nausea & vomiting 09/18/2017  . AKI (acute kidney injury) ([place])   . Bipolar disorder in remission ([place])   . Back pain 08/25/2016  . Generalized anxiety disorder 08/23/2015  . Knee pain 12/21/2014  . Dysuria 04/22/2014  . Tinea pedis of both feet 10/02/2013  . Menorrhagia 09/04/2012  . Allergic rhinitis 06/12/2012  . Eczema 03/26/2012  . Alcohol abuse 10/26/2010  . Bipolar 1 disorder ([place]) 10/09/2010  . Panic attacks 10/09/2010  . INSOMNIA, CHRONIC 06/29/2009  . TOBACCO USER 01/23/2009  . Obesity 10/29/2008  . Essential hypertension 12/20/2006    Past Surgical History:  Procedure Laterality Date  . BIOPSY  09/19/2017   Procedure: BIOPSY;  Surgeon: Otis Brace, MD;  Location: [place];  Service: Gastroenterology;;  . Murrell Redden &  CURRETTAGE/HYSTROSCOPY WITH HYDROTHERMAL ABLATION N/A 04/04/2017   Procedure: DILATATION & CURETTAGE/HYSTEROSCOPY WITH HYDROTHERMAL ABLATION;  Surgeon: Donnamae Jude, MD;  Location: [facility];  Service: Gynecology;  Laterality: N/A;  . ESOPHAGOGASTRODUODENOSCOPY (EGD) WITH PROPOFOL N/A 09/19/2017   Procedure: ESOPHAGOGASTRODUODENOSCOPY (EGD) WITH PROPOFOL;  Surgeon: Otis Brace, MD;  [facility];  Service: Gastroenterology;  Laterality: N/A;  . TUBAL LIGATION      OB History    Gravida  4   Para  3   Term  3   Preterm      AB  1   Living  3     SAB      TAB  1   Ectopic      Multiple      Live Births               Home Medications    Prior to Admission medications   Medication Sig Start Date End Date Taking? Authorizing Provider  acetaminophen (TYLENOL) 500 MG tablet Take 1,000 mg by mouth every 6 (six) hours as needed for mild pain.    [provider]  cephALEXin (KEFLEX) 500 MG capsule Take 1 capsule (500 mg total) by mouth 4 (four) times daily. 01/12/19   Chase Picket, MD  famotidine (PEPCID) 20 MG tablet Take 1 tablet (20 mg total) by mouth  2 (two) times daily. 04/17/18   Jacalyn Lefevre, MD  fluticasone (FLONASE) 50 MCG/ACT nasal spray Place 2 sprays into both nostrils daily. Patient taking differently: Place 2 sprays into both nostrils daily as needed for allergies.  02/25/17   Cathie Hoops, Amy V, PA-C  gabapentin (NEURONTIN) 300 MG capsule TAKE 1 CAPSULE BY MOUTH AT BEDTIME, MAY TAKE UP TO 3 TIMES DAILY IF NO IMPROVEMENT. 09/30/18   Marthenia Rolling, DO  hydrOXYzine (VISTARIL) 25 MG capsule Take 1 capsule (25 mg total) by mouth daily as needed for anxiety. 10/14/18   Arfeen, Phillips Grout, MD  lisinopril-hydrochlorothiazide (PRINZIDE,ZESTORETIC) 10-12.5 MG tablet Take 1 tablet by mouth daily. 09/19/17   Marthenia Rolling, DO  naproxen (NAPROSYN) 375 MG tablet Take 1 tablet (375 mg total) by mouth 2 (two) times daily. 01/16/19   Lamptey, Britta Mccreedy,  MD  OLANZapine zydis (ZYPREXA ZYDIS) 5 MG disintegrating tablet Take 1 tablet (5 mg total) by mouth at bedtime. 10/14/18   Arfeen, Phillips Grout, MD  traMADol (ULTRAM) 50 MG tablet Take 1 tablet (50 mg total) by mouth every 6 (six) hours as needed. 01/12/19   LampteyBritta Mccreedy, MD  promethazine (PHENERGAN) 25 MG tablet Take 1 tablet (25 mg total) by mouth every 6 (six) hours as needed for nausea or vomiting. 04/17/18 08/29/18  Jacalyn Lefevre, MD    Family History Family History  Problem Relation Age of Onset  . Cancer Maternal Grandmother        Breast  . Hypertension Mother   . Hypertension Brother   . Cancer Maternal Uncle        colon  . Bipolar disorder Cousin   . Schizophrenia Cousin     Social History Social History   Tobacco Use  . Smoking status: Current Every Day Smoker    Packs/day: 0.25    Years: 5.00    Pack years: 1.25  . Smokeless tobacco: Never Used  Substance Use Topics  . Alcohol use: Yes    Comment: occassionally   . Drug use: No     Allergies   Sumatriptan   Review of Systems Review of Systems  Constitutional: Negative.   Respiratory: Negative.   Genitourinary: Negative.   Musculoskeletal: Positive for arthralgias and joint swelling. Negative for back pain, gait problem, myalgias and neck pain.  Skin: Negative.   Neurological: Negative.      Physical Exam Triage Vital Signs ED Triage Vitals  Enc Vitals Group     BP 01/16/19 1438 137/90     Pulse Rate 01/16/19 1438 80     Resp 01/16/19 1438 16     Temp 01/16/19 1438 98.8 F (37.1 C)     Temp Source 01/16/19 1438 Oral     SpO2 01/16/19 1438 99 %     Weight --      Height --      Head Circumference --      Peak Flow --      Pain Score 01/16/19 1443 10     Pain Loc --      Pain Edu? --      Excl. in GC? --    No data found.  Updated Vital Signs BP 137/90 (BP Location: Left Arm)   Pulse 80   Temp 98.8 F (37.1 C) (Oral)   Resp 16   SpO2 99%   Visual Acuity Right Eye Distance:   Left  Eye Distance:   Bilateral Distance:    Right Eye Near:   Left Eye Near:  Bilateral Near:     Physical Exam Constitutional:      Appearance: She is not ill-appearing.  Cardiovascular:     Pulses: Normal pulses.     Heart sounds: Normal heart sounds.  Pulmonary:     Effort: Pulmonary effort is normal.     Breath sounds: Normal breath sounds. No wheezing, rhonchi or rales.  Abdominal:     General: Bowel sounds are normal.  Musculoskeletal:        General: Swelling present.     Comments: Right knee swelling.  Full range of motion.No bruising.  Skin:    General: Skin is warm.     Capillary Refill: Capillary refill takes less than 2 seconds.     Coloration: Skin is not jaundiced.     Findings: No erythema or lesion.  Neurological:     General: No focal deficit present.     Mental Status: She is alert and oriented to person, place, and time.      UC Treatments / Results  Labs (all labs ordered are listed, but only abnormal results are displayed) Labs Reviewed - No data to display  EKG   Radiology No results found.  Procedures Procedures (including critical care time)  Medications Ordered in UC Medications - No data to display  Initial Impression / Assessment and Plan / UC Course  I have reviewed the triage vital signs and the nursing notes.  Pertinent labs & imaging results that were available during my care of the patient were reviewed by me and considered in my medical decision making (see chart for details).     1.  Painful right knee swelling likely chronic: Refill of naproxen Orthopedic surgery referral for definitive management Work excuse note given to the patient. Final Clinical Impressions(s) / UC Diagnoses   Final diagnoses:  Right knee pain, unspecified chronicity   Discharge Instructions   None    ED Prescriptions    Medication Sig Dispense Auth. Provider   naproxen (NAPROSYN) 375 MG tablet Take 1 tablet (375 mg total) by mouth 2 (two)  times daily. 20 tablet Lamptey, Britta Mccreedy, MD     PDMP not reviewed this encounter.   Merrilee Jansky, MD 01/16/19 [phone]

## 2019-01-16 NOTE — ED Notes (Signed)
Pt states she has been vomiting since 3 days ago while taking medications prescribed 4 days ago from her last visit

## 2019-01-16 NOTE — ED Triage Notes (Signed)
Pt presents to UC stating she was seen 4 days ago for right knee injury. Pt presents to UC today stating her employment wants her to go back to work but pt states she is in too much pain to go back, so she would like to be evaluated and taken out of work for a few more days. Pt also states her employment needs documentation of her visits.

## 2019-01-21 ENCOUNTER — Encounter (HOSPITAL_COMMUNITY): Payer: Self-pay | Admitting: Emergency Medicine

## 2019-01-21 ENCOUNTER — Emergency Department (HOSPITAL_COMMUNITY)
Admission: EM | Admit: 2019-01-21 | Discharge: 2019-01-21 | Disposition: A | Discharge status: Home / Self Care | Payer: Self-pay | Attending: Emergency Medicine | Admitting: Emergency Medicine

## 2019-01-21 ENCOUNTER — Other Ambulatory Visit: Payer: Self-pay

## 2019-01-21 DIAGNOSIS — F17200 Nicotine dependence, unspecified, uncomplicated: Secondary | ICD-10-CM | POA: Insufficient documentation

## 2019-01-21 DIAGNOSIS — I10 Essential (primary) hypertension: Secondary | ICD-10-CM | POA: Insufficient documentation

## 2019-01-21 DIAGNOSIS — M25561 Pain in right knee: Secondary | ICD-10-CM | POA: Diagnosis present

## 2019-01-21 DIAGNOSIS — Z79899 Other long term (current) drug therapy: Secondary | ICD-10-CM | POA: Diagnosis not present

## 2019-01-21 DIAGNOSIS — M25461 Effusion, right knee: Secondary | ICD-10-CM | POA: Diagnosis not present

## 2019-01-21 MED ORDER — HYDROCODONE-ACETAMINOPHEN 5-325 MG PO TABS
1.0000 | ORAL_TABLET | Freq: Once | ORAL | Status: AC
  Administered 2019-01-21: 1 via ORAL
  Filled 2019-01-21: qty 1

## 2019-01-21 MED ORDER — HYDROCODONE-ACETAMINOPHEN 5-325 MG PO TABS: 1.0000 | ORAL_TABLET | Freq: Four times a day (QID) | ORAL | 0 refills | Status: DC | PRN

## 2019-01-21 NOTE — Discharge Instructions (Signed)
Continue using knee brace and crutches, ice and elevate the knee as much as possible.  Continue taking naproxen twice daily, Norco for breakthrough pain.  Please call first thing tomorrow morning to schedule follow-up appointment with Dr. Stann Mainland with orthopedics.

## 2019-01-21 NOTE — ED Triage Notes (Signed)
Pt in with R knee pain. States she was injured at work, was evaluated at [facility], and remains in pain. Tramadol and Naproxen not effective per pt. Brace on in triage.

## 2019-01-21 NOTE — ED Provider Notes (Signed)
[facility] EMERGENCY DEPARTMENT Provider Note   CSN: [id number] Arrival date & time: 01/21/19  1236     History   Chief Complaint Chief Complaint  Patient presents with  . Knee Pain    HPI Kristin [name] is a 43 y.o. female.     Kristin [name] is a 43 y.o. female with a history of hypertension, migraines, arthritis, depression, anxiety, who presents to the ED for ongoing right knee pain.  She had an injury at work at the beginning of the month and has been seen at urgent care twice regarding this knee pain.  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  She has history of a previous ACL injury many years ago that she was supposed to have surgery on but never did.  Ever since injury she has been having pain and swelling in the knee.  She has been given an knee brace and crutches.  She was initially prescribed tramadol and naproxen reports minimal relief with naproxen and tramadol made her very nauseous and she was unable to tolerate it.  She has been referred to orthopedics from urgent care but due to insurance issues was not able to follow-up and was referred to the ED.  She is already had x-rays of the knee which show no evidence of fracture.  She denies any redness or warmth over the knee, no fevers or chills.  She reports that pain is made worse with movement and improves with rest it is difficult for her to rest because she has had to continue to go back to work because she is having issues with Worker's Comp., and has multiple children at home.      Past Medical History:  Diagnosis Date  . Acid reflux   . Anxiety   . Arthritis   . Depression   . Eczema   . Hypertension   . Migraine headache     Patient Active Problem List   Diagnosis Date Noted  . Suicidal ideation 08/08/2018  . Vaginal irritation 03/07/2018  . Acute cystitis without hematuria 11/09/2017  . Injury of toe on right foot 10/16/2017  . Esophagitis determined by endoscopy    . Dehydration   . Nausea & vomiting 09/18/2017  . AKI (acute kidney injury) (HCC)   . Bipolar disorder in remission (HCC)   . Back pain 08/25/2016  . Generalized anxiety disorder 08/23/2015  . Knee pain 12/21/2014  . Dysuria 04/22/2014  . Tinea pedis of both feet 10/02/2013  . Menorrhagia 09/04/2012  . Allergic rhinitis 06/12/2012  . Eczema 03/26/2012  . Alcohol abuse 10/26/2010  . Bipolar 1 disorder (HCC) 10/09/2010  . Panic attacks 10/09/2010  . INSOMNIA, CHRONIC 06/29/2009  . TOBACCO USER 01/23/2009  . Obesity 10/29/2008  . Essential hypertension 12/20/2006    Past Surgical History:  Procedure Laterality Date  . BIOPSY  09/19/2017   Procedure: BIOPSY;  Surgeon: Kathi Der, MD;  Location: [facility];  Service: Gastroenterology;;  . [name] & CURRETTAGE/HYSTROSCOPY WITH HYDROTHERMAL ABLATION N/A 04/04/2017   Procedure: DILATATION & CURETTAGE/HYSTEROSCOPY WITH HYDROTHERMAL ABLATION;  Surgeon: Reva Bores, MD;  Location: [facility];  Service: Gynecology;  Laterality: N/A;  . ESOPHAGOGASTRODUODENOSCOPY (EGD) WITH PROPOFOL N/A 09/19/2017   Procedure: ESOPHAGOGASTRODUODENOSCOPY (EGD) WITH PROPOFOL;  Surgeon: Kathi Der, MD;  Location: [facility];  Service: Gastroenterology;  Laterality: N/A;  . TUBAL LIGATION       OB History    Gravida  4  Para  3   Term  3   Preterm      AB  1   Living  3     SAB      TAB  1   Ectopic      Multiple      Live Births               Home Medications    Prior to Admission medications   Medication Sig Start Date End Date Taking? Authorizing Provider  acetaminophen (TYLENOL) 500 MG tablet Take 1,000 mg by mouth every 6 (six) hours as needed for mild pain.    [provider]  cephALEXin (KEFLEX) 500 MG capsule Take 1 capsule (500 mg total) by mouth 4 (four) times daily. 01/12/19   Chase Picket, MD  famotidine (PEPCID) 20 MG tablet Take 1 tablet (20 mg total) by mouth 2 (two)  times daily. 04/17/18   Isla Pence, MD  fluticasone (FLONASE) 50 MCG/ACT nasal spray Place 2 sprays into both nostrils daily. Patient taking differently: Place 2 sprays into both nostrils daily as needed for allergies.  02/25/17   Tasia Catchings, Amy V, PA-C  gabapentin (NEURONTIN) 300 MG capsule TAKE 1 CAPSULE BY MOUTH AT BEDTIME, MAY TAKE UP TO 3 TIMES DAILY IF NO IMPROVEMENT. 09/30/18   Sherene Sires, DO  HYDROcodone-acetaminophen (NORCO) 5-325 MG tablet Take 1 tablet by mouth every 6 (six) hours as needed. 01/21/19   Jacqlyn Larsen, PA-C  hydrOXYzine (VISTARIL) 25 MG capsule Take 1 capsule (25 mg total) by mouth daily as needed for anxiety. 10/14/18   Arfeen, Arlyce Harman, MD  lisinopril-hydrochlorothiazide (PRINZIDE,ZESTORETIC) 10-12.5 MG tablet Take 1 tablet by mouth daily. 09/19/17   Sherene Sires, DO  naproxen (NAPROSYN) 375 MG tablet Take 1 tablet (375 mg total) by mouth 2 (two) times daily. 01/16/19   Lamptey, Myrene Galas, MD  OLANZapine zydis (ZYPREXA ZYDIS) 5 MG disintegrating tablet Take 1 tablet (5 mg total) by mouth at bedtime. 10/14/18   Arfeen, Arlyce Harman, MD  traMADol (ULTRAM) 50 MG tablet Take 1 tablet (50 mg total) by mouth every 6 (six) hours as needed. 01/12/19   LampteyMyrene Galas, MD  promethazine (PHENERGAN) 25 MG tablet Take 1 tablet (25 mg total) by mouth every 6 (six) hours as needed for nausea or vomiting. 04/17/18 08/29/18  Isla Pence, MD    Family History Family History  Problem Relation Age of Onset  . Cancer Maternal Grandmother        Breast  . Hypertension Mother   . Hypertension Brother   . Cancer Maternal Uncle        colon  . Bipolar disorder Cousin   . Schizophrenia Cousin     Social History Social History   Tobacco Use  . Smoking status: Current Every Day Smoker    Packs/day: 0.25    Years: 5.00    Pack years: 1.25  . Smokeless tobacco: Never Used  Substance Use Topics  . Alcohol use: Yes    Comment: occassionally   . Drug use: No     Allergies   Sumatriptan    Review of Systems Review of Systems  Constitutional: Negative for chills and fever.  Gastrointestinal: Negative for nausea and vomiting.  Musculoskeletal: Positive for arthralgias and joint swelling.  Skin: Negative for color change, rash and wound.  Neurological: Negative for weakness and numbness.     Physical Exam Updated Vital Signs BP (!) 156/75 (BP Location: Left Arm)   Pulse 76  Temp 98.6 F (37 C) (Oral)   Resp 18   Wt 77.1 kg   SpO2 99%   BMI 27.86 kg/m   Physical Exam Vitals signs and nursing note reviewed.  Constitutional:      General: She is not in acute distress.    Appearance: Normal appearance. She is well-developed and normal weight. She is not ill-appearing or diaphoretic.  HENT:     Head: Normocephalic and atraumatic.  Eyes:     General:        Right eye: No discharge.        Left eye: No discharge.  Pulmonary:     Effort: Pulmonary effort is normal. No respiratory distress.  Musculoskeletal:     Comments: Tenderness to palpation over the right knee with some swelling present, no erythema or warmth.  Patient is able to flex and extend greater than 90 degrees with some discomfort.  Distal pulses intact, normal sensation and strength.  Skin:    General: Skin is warm and dry.  Neurological:     Mental Status: She is alert and oriented to person, place, and time.     Coordination: Coordination normal.  Psychiatric:        Mood and Affect: Mood normal.        Behavior: Behavior normal.      ED Treatments / Results  Labs (all labs ordered are listed, but only abnormal results are displayed) Labs Reviewed - No data to display  EKG None  Radiology No results found.  Procedures Procedures (including critical care time)  Medications Ordered in ED Medications  HYDROcodone-acetaminophen (NORCO/VICODIN) 5-325 MG per tablet 1 tablet (1 tablet Oral Given 01/21/19 1617)     Initial Impression / Assessment and Plan / ED Course  I have reviewed  the triage vital signs and the nursing notes.  Pertinent labs & imaging results that were available during my care of the patient were reviewed by me and considered in my medical decision making (see chart for details).  Patient presents with persistent right knee pain, she has been seen multiple times at urgent care after injury to the knee at work, has had difficulty getting into see orthopedics for follow-up.  Has already had negative x-rays with no new injury do not think that additional x-rays are warranted at this time.  Known history of ACL injury, there is also potential for additional ligamentous injury, patient has knee effusion but I feel this is more likely traumatic, exam is not suggestive of septic arthritis and patient has no fevers or systemic signs of infection.  She already has knee brace and crutches.  Was given tramadol for breakthrough pain but did not tolerate this due to severe nausea.  Will give dose of Norco here and provide patient with short course.  Ortho follow-up with Dr. Aundria Rud.  Return precautions discussed.  Patient expresses understanding and agreement with plan.  Discharged home in good condition.  Final Clinical Impressions(s) / ED Diagnoses   Final diagnoses:  Acute pain of right knee  Effusion of right knee    ED Discharge Orders         Ordered    HYDROcodone-acetaminophen (NORCO) 5-325 MG tablet  Every 6 hours PRN     01/21/19 1645           Dartha Lodge, [place] 01/21/19 1700    Melene Plan, DO 01/21/19 2018

## 2019-01-21 NOTE — ED Notes (Signed)
Patient Alert and oriented to baseline. Stable and ambulatory to baseline. Patient verbalized understanding of the discharge instructions.  Patient belongings were taken by the patient.   

## 2019-01-29 ENCOUNTER — Other Ambulatory Visit: Payer: Self-pay

## 2019-01-29 ENCOUNTER — Encounter (HOSPITAL_COMMUNITY): Payer: Self-pay | Admitting: Emergency Medicine

## 2019-01-29 ENCOUNTER — Ambulatory Visit (HOSPITAL_COMMUNITY)
Admission: EM | Admit: 2019-01-29 | Discharge: 2019-01-29 | Disposition: A | Discharge status: Home / Self Care | Payer: Worker's Compensation | Attending: Emergency Medicine | Admitting: Emergency Medicine

## 2019-01-29 DIAGNOSIS — M25561 Pain in right knee: Secondary | ICD-10-CM | POA: Diagnosis not present

## 2019-01-29 MED ORDER — MELOXICAM 15 MG PO TABS: 15.0000 mg | ORAL_TABLET | Freq: Every day | ORAL | 0 refills | Status: AC

## 2019-01-29 MED ORDER — HYDROCODONE-ACETAMINOPHEN 5-325 MG PO TABS: 1.0000 | ORAL_TABLET | Freq: Four times a day (QID) | ORAL | 0 refills | Status: DC | PRN

## 2019-01-29 NOTE — ED Provider Notes (Signed)
HPI  SUBJECTIVE:  Kristin [name] is a 43 y.o. female who presents with continued right knee pain is described as sharp, shooting, constant, continued swelling.  She states that she is having difficulty walking.  She states the pain is getting worse.  No fevers, body aches, reinjury.  No giving way.  She reports popping.  No erythema.  She has tried ibuprofen 800 mg 3 or 4 times a day, Tylenol, steroids, tramadol and Norco without improvement in her symptoms.  Symptoms are worse with walking.  She does have crutches and a brace.  She states that the brace helps.  Patient was initially seen at the [facility] urgent care on 11/1 with severe right knee pain.  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  Denies direct trauma to it.  She was found to have a small suprapatellar effusion, soft tissue edema.  She was given Toradol, dexamethasone, short course of prednisone, tramadol and Naprosyn, recommended follow-up with orthopedics.  Returned to the urgent care on 11/5 with improvement in her symptoms, had a refill of her Naprosyn, referred to orthopedic surgery for definitive management.  Went to the ER on 11/10 because was unable to follow-up with orthopedics, was sent home with Norco and told to follow-up with orthopedics.  Patient states that she does not have insurance and cannot afford to see orthopedics.  She states that she is planning on filing with Worker's Compensation.  She has a past medical history of ACL injury that was going to need repair but she never had repaired, hypertension.  LMP: Status post ablation.  She is amenorrheic.  PMD: [facility].  Past Medical History:  Diagnosis Date  . Acid reflux   . Anxiety   . Arthritis   . Depression   . Eczema   . Hypertension   . Migraine headache     Past Surgical History:  Procedure Laterality Date  . BIOPSY  09/19/2017   Procedure: BIOPSY;  Surgeon: Kathi Der, MD;  Location: [facility];  Service:  Gastroenterology;;  . [name] & CURRETTAGE/HYSTROSCOPY WITH HYDROTHERMAL ABLATION N/A 04/04/2017   Procedure: DILATATION & CURETTAGE/HYSTEROSCOPY WITH HYDROTHERMAL ABLATION;  Surgeon: Reva Bores, MD;  Location: [facility];  Service: Gynecology;  Laterality: N/A;  . ESOPHAGOGASTRODUODENOSCOPY (EGD) WITH PROPOFOL N/A 09/19/2017   Procedure: ESOPHAGOGASTRODUODENOSCOPY (EGD) WITH PROPOFOL;  Surgeon: Kathi Der, MD;  Location: [facility];  Service: Gastroenterology;  Laterality: N/A;  . TUBAL LIGATION      Family History  Problem Relation Age of Onset  . Cancer Maternal Grandmother        Breast  . Hypertension Mother   . Hypertension Brother   . Cancer Maternal Uncle        colon  . Bipolar disorder Cousin   . Schizophrenia Cousin     Social History   Tobacco Use  . Smoking status: Current Every Day Smoker    Packs/day: 0.25    Years: 5.00    Pack years: 1.25  . Smokeless tobacco: Never Used  Substance Use Topics  . Alcohol use: Yes    Comment: occassionally   . Drug use: No    No current facility-administered medications for this encounter.   Current Outpatient Medications:  .  acetaminophen (TYLENOL) 500 MG tablet, Take 1,000 mg by mouth every 6 (six) hours as needed for mild pain., Disp: , Rfl:  .  cephALEXin (KEFLEX) 500 MG capsule, Take 1 capsule (500 mg total) by  mouth 4 (four) times daily., Disp: 20 capsule, Rfl: 0 .  famotidine (PEPCID) 20 MG tablet, Take 1 tablet (20 mg total) by mouth 2 (two) times daily., Disp: 30 tablet, Rfl: 0 .  fluticasone (FLONASE) 50 MCG/ACT nasal spray, Place 2 sprays into both nostrils daily. (Patient taking differently: Place 2 sprays into both nostrils daily as needed for allergies. ), Disp: 1 g, Rfl: 0 .  HYDROcodone-acetaminophen (NORCO/VICODIN) 5-325 MG tablet, Take 1-2 tablets by mouth every 6 (six) hours as needed for moderate pain or severe pain., Disp: 12 tablet, Rfl: 0 .  hydrOXYzine (VISTARIL) 25 MG  capsule, Take 1 capsule (25 mg total) by mouth daily as needed for anxiety., Disp: 20 capsule, Rfl: 1 .  lisinopril-hydrochlorothiazide (PRINZIDE,ZESTORETIC) 10-12.5 MG tablet, Take 1 tablet by mouth daily., Disp: 90 tablet, Rfl: 3 .  meloxicam (MOBIC) 15 MG tablet, Take 1 tablet (15 mg total) by mouth daily for 7 days., Disp: 7 tablet, Rfl: 0 .  OLANZapine zydis (ZYPREXA ZYDIS) 5 MG disintegrating tablet, Take 1 tablet (5 mg total) by mouth at bedtime., Disp: 30 tablet, Rfl: 1  Allergies  Allergen Reactions  . Sumatriptan Anaphylaxis     ROS  As noted in HPI.   Physical Exam  BP (!) 154/100 (BP Location: Left Arm)   Pulse 82   Temp 98.2 F (36.8 C) (Oral)   Resp 18   SpO2 99%   Constitutional: Well developed, well nourished, appears uncomfortable.  Wearing a knee brace, has a single crutch with her Eyes:  EOMI, conjunctiva normal bilaterally HENT: Normocephalic, atraumatic,mucus membranes moist Respiratory: Normal inspiratory effort Cardiovascular: Normal rate GI: nondistended skin: No rash, skin intact Musculoskeletal: R Knee ROM  decreased due to pain, Flexion/extension intact Tenderness entire joint, Patella NT, Patellar tendon tender, Medial joint  tender, Lateral joint tender, Popliteal region tender, Varus MCL stress testing stable, Valgus LCL stress testing stable, McMurray's testing  abnormal, some mild laxity with anterior drawer test.   Distal NVI with intact baseline sensation / motor / pulse distal to knee.  Positive effusion. No erythema. No increased temperature. No crepitus.  Neurologic: Alert & oriented x 3, no focal neuro deficits Psychiatric: Speech and behavior appropriate   ED Course   Medications - No data to display  No orders of the defined types were placed in this encounter.   No results found for this or any previous visit (from the past 24 hour(s)). No results found.  ED Clinical Impression  1. Right knee pain, unspecified chronicity       ED Assessment/Plan  Urgent care and ER records reviewed.  As noted in HPI.  Do not think that she needs repeat imaging as she denies repeat trauma.  No evidence of septic joint.  Suspect traumatic effusion.  Discussed with her that she needs evaluation by orthopedics, either through [employer]. or she can go through her own insurance and be seen by orthopedics sooner.  We will refill her Norco, try Mobic, discontinue other NSAIDs, continue 1 g of Tylenol 3-4 times a day as needed for pain.  We will have her follow-up with Dr. Jena Gauss if she chooses to go through her own insurance/pays out-of-pocket.  Continue knee brace and crutches or cane.  Work note for 3 days.  Reviewed  [facility] narcotic database.  2 narcotic prescriptions in 2020, both of them recently.  Feel that it is appropriate to give her another short prescription of narcotics.  Discussed MDM, treatment plan, and plan for  follow-up with patient.  patient agrees with plan.   Meds ordered this encounter  Medications  . HYDROcodone-acetaminophen (NORCO/VICODIN) 5-325 MG tablet    Sig: Take 1-2 tablets by mouth every 6 (six) hours as needed for moderate pain or severe pain.    Dispense:  12 tablet    Refill:  0  . meloxicam (MOBIC) 15 MG tablet    Sig: Take 1 tablet (15 mg total) by mouth daily for 7 days.    Dispense:  7 tablet    Refill:  0    *This clinic note was created using [employer]. Therefore, there may be occasional mistakes despite careful proofreading.   ?.Corena Pilgrim, MD 01/29/19 [phone]

## 2019-01-29 NOTE — ED Triage Notes (Signed)
Pt here for chronic knee pain

## 2019-01-29 NOTE — Discharge Instructions (Addendum)
Stop Advil, ibuprofen.  Start Mobic.  You take this once in the morning.  You may take a Tylenol containing product 3-4 times a day.  Either take at 1000 mg of Tylenol for mild to moderate pain or 1-2 Norco for severe pain.  Do not take Tylenol and Norco together as they both have Tylenol in them and too much Tylenol can hurt your liver.  Do not exceed 4 g of Tylenol from all sources in 1 day.  Ice your knee for 20 minutes at a time, especially after use.  Follow-up with Workmen's Compensation provider or with Dr. Doreatha Martin as soon as you possibly can for definitive treatment.  You can either go through [employer] for referral to orthopedics and it will be paid for, or you can use your own insurance/pay out-of-pocket to be seen by orthopedics sooner.

## 2019-04-14 ENCOUNTER — Other Ambulatory Visit: Payer: Self-pay

## 2019-04-14 ENCOUNTER — Ambulatory Visit (HOSPITAL_COMMUNITY)
Admission: EM | Admit: 2019-04-14 | Discharge: 2019-04-14 | Disposition: A | Discharge status: Home / Self Care | Payer: Self-pay | Attending: Family Medicine | Admitting: Family Medicine

## 2019-04-14 ENCOUNTER — Ambulatory Visit (INDEPENDENT_AMBULATORY_CARE_PROVIDER_SITE_OTHER): Payer: Self-pay

## 2019-04-14 DIAGNOSIS — S161XXA Strain of muscle, fascia and tendon at neck level, initial encounter: Secondary | ICD-10-CM

## 2019-04-14 DIAGNOSIS — S39012A Strain of muscle, fascia and tendon of lower back, initial encounter: Secondary | ICD-10-CM

## 2019-04-14 DIAGNOSIS — S7002XA Contusion of left hip, initial encounter: Secondary | ICD-10-CM

## 2019-04-14 DIAGNOSIS — R52 Pain, unspecified: Secondary | ICD-10-CM

## 2019-04-14 DIAGNOSIS — S7012XA Contusion of left thigh, initial encounter: Secondary | ICD-10-CM

## 2019-04-14 DIAGNOSIS — M549 Dorsalgia, unspecified: Secondary | ICD-10-CM

## 2019-04-14 MED ORDER — HYDROCODONE-ACETAMINOPHEN 5-325 MG PO TABS: 2.0000 | ORAL_TABLET | ORAL | 0 refills | Status: DC | PRN

## 2019-04-14 MED ORDER — IBUPROFEN 800 MG PO TABS: 800.0000 mg | ORAL_TABLET | Freq: Three times a day (TID) | ORAL | 0 refills | Status: DC

## 2019-04-14 MED ORDER — TIZANIDINE HCL 4 MG PO TABS: 4.0000 mg | ORAL_TABLET | Freq: Four times a day (QID) | ORAL | 0 refills | Status: DC | PRN
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
## 2019-04-14 NOTE — Discharge Instructions (Signed)
You need to rest.  Activity as tolerated.  Avoid bending and lifting activities. Ice or heat to painful muscles Ibuprofen 3 times a day with food Take pain medicine as needed.  Do not drive on pain medicine Take muscle relaxer as needed.  This is useful at bedtime Expect improvement over next few days. See your family practice doctor if not better towards the end of the week
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
## 2019-06-02 ENCOUNTER — Other Ambulatory Visit: Payer: Self-pay | Admitting: Family Medicine

## 2019-06-02 DIAGNOSIS — G8929 Other chronic pain: Secondary | ICD-10-CM

## 2019-06-02 DIAGNOSIS — M5442 Lumbago with sciatica, left side: Secondary | ICD-10-CM

## 2019-06-06 ENCOUNTER — Other Ambulatory Visit: Payer: Self-pay | Admitting: Family Medicine

## 2019-06-06 DIAGNOSIS — G8929 Other chronic pain: Secondary | ICD-10-CM

## 2019-06-06 DIAGNOSIS — M5442 Lumbago with sciatica, left side: Secondary | ICD-10-CM
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
## 2019-06-25 ENCOUNTER — Encounter: Payer: Self-pay | Admitting: Family Medicine

## 2019-06-25 ENCOUNTER — Ambulatory Visit (INDEPENDENT_AMBULATORY_CARE_PROVIDER_SITE_OTHER): Payer: Medicaid Other | Admitting: Family Medicine

## 2019-06-25 ENCOUNTER — Other Ambulatory Visit: Payer: Self-pay

## 2019-06-25 VITALS — BP 128/84 | HR 79 | Ht 64.0 in | Wt 188.6 lb

## 2019-06-25 DIAGNOSIS — K209 Esophagitis, unspecified without bleeding: Secondary | ICD-10-CM

## 2019-06-25 DIAGNOSIS — M25561 Pain in right knee: Secondary | ICD-10-CM

## 2019-06-25 DIAGNOSIS — M5442 Lumbago with sciatica, left side: Secondary | ICD-10-CM

## 2019-06-25 DIAGNOSIS — G8929 Other chronic pain: Secondary | ICD-10-CM

## 2019-06-25 DIAGNOSIS — M5441 Lumbago with sciatica, right side: Secondary | ICD-10-CM

## 2019-06-25 DIAGNOSIS — R350 Frequency of micturition: Secondary | ICD-10-CM

## 2019-06-25 DIAGNOSIS — M25562 Pain in left knee: Secondary | ICD-10-CM

## 2019-06-25 DIAGNOSIS — M1731 Unilateral post-traumatic osteoarthritis, right knee: Secondary | ICD-10-CM

## 2019-06-25 DIAGNOSIS — L309 Dermatitis, unspecified: Secondary | ICD-10-CM

## 2019-06-25 DIAGNOSIS — N3000 Acute cystitis without hematuria: Secondary | ICD-10-CM

## 2019-06-25 LAB — POCT URINALYSIS DIP (MANUAL ENTRY)
Blood, UA: NEGATIVE
Glucose, UA: NEGATIVE mg/dL
Nitrite, UA: POSITIVE — AB
Protein Ur, POC: 100 mg/dL — AB
Spec Grav, UA: 1.025 (ref 1.010–1.025)
Urobilinogen, UA: 1 E.U./dL
pH, UA: 6 (ref 5.0–8.0)

## 2019-06-25 MED ORDER — GABAPENTIN 300 MG PO CAPS: 300.0000 mg | ORAL_CAPSULE | Freq: Three times a day (TID) | ORAL | 2 refills | Status: DC

## 2019-06-25 MED ORDER — CEPHALEXIN 500 MG PO CAPS: 500.0000 mg | ORAL_CAPSULE | Freq: Four times a day (QID) | ORAL | 0 refills | Status: AC

## 2019-06-25 MED ORDER — TRIAMCINOLONE ACETONIDE 0.1 % EX OINT: 1.0000 "application " | TOPICAL_OINTMENT | Freq: Two times a day (BID) | CUTANEOUS | 3 refills | Status: DC

## 2019-06-25 MED ORDER — IBUPROFEN 600 MG PO TABS: 600.0000 mg | ORAL_TABLET | Freq: Four times a day (QID) | ORAL | 1 refills | Status: DC

## 2019-06-25 MED ORDER — FAMOTIDINE 20 MG PO TABS: 20.0000 mg | ORAL_TABLET | Freq: Two times a day (BID) | ORAL | 0 refills | Status: DC

## 2019-06-25 MED ORDER — FLUCONAZOLE 150 MG PO TABS: 150.0000 mg | ORAL_TABLET | Freq: Once | ORAL | 0 refills | Status: AC

## 2019-06-25 NOTE — Patient Instructions (Addendum)
You have osteoarthritis of your right knee.  No great treatments. When it flairs up like now, I think you would benefit from being on an antiinflammatory drug (ibuprofen) regularly, not just for the pain but also to calm down the inflammation.   I sent in several meds - see below. I also treated you for a urinary tract infection. Remember to learn to use your cane in the left hand, easier on your back.   Eventually (years from now), you are likely looking at knee replacement surgery.

## 2019-06-26 ENCOUNTER — Encounter: Payer: Self-pay | Admitting: Family Medicine

## 2019-06-26 NOTE — Assessment & Plan Note (Signed)
UA highly suggestive.  Begin keflex

## 2019-06-26 NOTE — Assessment & Plan Note (Signed)
Acute flair of chronic osteoarthritis.  Unclear cause of flair.

## 2019-06-26 NOTE — Assessment & Plan Note (Signed)
Refill pepcid per patient request.

## 2019-06-26 NOTE — Assessment & Plan Note (Signed)
Refill triamcinolone per patient request.

## 2019-06-26 NOTE — Progress Notes (Signed)
    SUBJECTIVE:   CHIEF COMPLAINT / HPI:   Urinary frequency , right knee pain and refill meds. 1. Urinary frequency and urgency.  No fever, dysuria or flank pain.  No vag DC.  Hx of UTIs.  Does have mild increase in chronic low back pain consistent with previous UTIs.   2. Right knee pain and swelling x 1 week.  No injury or overuse.  Previous knee problems.  Per patient, had torn ACL in 2011 that was not repair (she is ACL deficient.)  Than had injury at work in Nov 2011.  I found old x rays from 01/12/2019 that show "mild degenerative changes.  She knows to use a cane but was using in the wrong hand. 3. Wants refill on her pepcid and eczema medictions.      OBJECTIVE:   BP 128/84   Pulse 79   Ht 5\' 4"  (1.626 m)   Wt 188 lb 9.6 oz (85.5 kg)   SpO2 99%   BMI 32.37 kg/m   Low back mild paraspinous muscle tenderness.  Minimal lower abd tenderness. Right knee, possible mild effusion.  Medial collateral lig lax and intact.  Normal lat collateral.  As expected, some ACL give.  Not much tenderness to patellar compression.  ASSESSMENT/PLAN:   Acute cystitis without hematuria UA highly suggestive.  Begin keflex  Esophagitis determined by endoscopy Refill pepcid per patient request.  Eczema Refill triamcinolone per patient request.  Osteoarthritis of right knee Acute flair of chronic osteoarthritis.  Unclear cause of flair.     , MD [facility]

## 2019-06-27 LAB — URINE CULTURE

## 2019-07-04 ENCOUNTER — Other Ambulatory Visit: Payer: Self-pay

## 2019-07-04 ENCOUNTER — Encounter: Payer: Self-pay | Admitting: Family Medicine

## 2019-07-04 ENCOUNTER — Ambulatory Visit (INDEPENDENT_AMBULATORY_CARE_PROVIDER_SITE_OTHER): Payer: Self-pay | Admitting: Family Medicine

## 2019-07-04 VITALS — BP 126/84 | HR 78 | Wt 196.0 lb

## 2019-07-04 DIAGNOSIS — M5441 Lumbago with sciatica, right side: Secondary | ICD-10-CM

## 2019-07-04 DIAGNOSIS — F319 Bipolar disorder, unspecified: Secondary | ICD-10-CM

## 2019-07-04 DIAGNOSIS — I10 Essential (primary) hypertension: Secondary | ICD-10-CM

## 2019-07-04 DIAGNOSIS — F419 Anxiety disorder, unspecified: Secondary | ICD-10-CM

## 2019-07-04 DIAGNOSIS — M5442 Lumbago with sciatica, left side: Secondary | ICD-10-CM

## 2019-07-04 DIAGNOSIS — G8929 Other chronic pain: Secondary | ICD-10-CM

## 2019-07-04 MED ORDER — TIZANIDINE HCL 4 MG PO TABS: 4.0000 mg | ORAL_TABLET | Freq: Four times a day (QID) | ORAL | 0 refills | Status: DC | PRN

## 2019-07-04 MED ORDER — LISINOPRIL-HYDROCHLOROTHIAZIDE 10-12.5 MG PO TABS: 1.0000 | ORAL_TABLET | Freq: Every day | ORAL | 3 refills | Status: DC

## 2019-07-04 MED ORDER — OLANZAPINE 5 MG PO TBDP: 5.0000 mg | ORAL_TABLET | Freq: Every day | ORAL | 1 refills | Status: DC

## 2019-07-04 MED ORDER — ACETAMINOPHEN 500 MG PO TABS: 500.0000 mg | ORAL_TABLET | Freq: Three times a day (TID) | ORAL | 1 refills | Status: DC | PRN

## 2019-07-04 MED ORDER — HYDROXYZINE PAMOATE 25 MG PO CAPS: 25.0000 mg | ORAL_CAPSULE | Freq: Every day | ORAL | 1 refills | Status: DC | PRN

## 2019-07-04 NOTE — Patient Instructions (Signed)
Today we talked about your chronic pain issues, I have reviewed your imaging and I agree that you have a proven source of pain there.  I think that it might be reasonable for you to go back and see the orthopedic surgeon that you have already seen before.  So I put in a referral to [facility]  If you do not hear from them by middle of next week it would be fair to call across and just ask if you could schedule appointment.  I have also refilled most of her medications but I want to make sure that I am giving you some precautions about the Tylenol and ibuprofen you are taking  Please do not take more than 1000 mg of Tylenol at a time, you should also not take more than 2000 mg/day Please do not take more than 800 mg of ibuprofen at a time, you should not take more than 2000 mg a day of ibuprofen  Dr. Parke Simmers

## 2019-07-07 ENCOUNTER — Other Ambulatory Visit: Payer: Self-pay | Admitting: Family Medicine

## 2019-07-07 DIAGNOSIS — F419 Anxiety disorder, unspecified: Secondary | ICD-10-CM | POA: Insufficient documentation

## 2019-07-07 DIAGNOSIS — K209 Esophagitis, unspecified without bleeding: Secondary | ICD-10-CM

## 2019-07-07 NOTE — Assessment & Plan Note (Signed)
Lost to f/u with murphy wainer due to insurance issue.  Chrnoic pain persisting without red flag symptoms  Requests referral to go back to ortho

## 2019-07-07 NOTE — Progress Notes (Signed)
    SUBJECTIVE:   CHIEF COMPLAINT / HPI: chronic back pain  Patient with chronic back pain without radiation/weakness/incontinence/paresthesia.  She has had no fever and doesn't use IV drugs.  Has been using ibuprofen.  No falls/injuries lately, no acute changes  Also, had been without meds because she lost insurance but this is now resolved  PERTINENT  PMH / PSH: MRI findings of bulging disk  OBJECTIVE:   BP 126/84   Pulse 78   Wt 196 lb (88.9 kg)   SpO2 98%   BMI 33.64 kg/m   Gen: alert/pleasant/no distress MSK: no midline back tenderness, does have some mild hypertonicitiy on right lumbar musculature, no assymetric muscle weakness or atrophy, no sensation changes distally  ASSESSMENT/PLAN:   Essential hypertension Well controlled, no changes indicated  Bipolar 1 disorder No si/hi, thinks she is stable currently.  Refill chronic meds  Anxiety Feels hydoxyxine helps, stable with no acute changes  Refill chronic meds  Back pain Lost to f/u with [facility] due to insurance issue.  Chrnoic pain persisting without red flag symptoms  Requests referral to go back to ortho     Marthenia Rolling, DO [facility]

## 2019-07-07 NOTE — Assessment & Plan Note (Signed)
Feels hydoxyxine helps, stable with no acute changes  Refill chronic meds

## 2019-07-07 NOTE — Assessment & Plan Note (Signed)
Well controlled, no changes indicated

## 2019-07-07 NOTE — Assessment & Plan Note (Signed)
No si/hi, thinks she is stable currently.  Refill chronic meds

## 2019-07-10 ENCOUNTER — Other Ambulatory Visit: Payer: Self-pay | Admitting: Family Medicine

## 2019-07-10 DIAGNOSIS — M25561 Pain in right knee: Secondary | ICD-10-CM

## 2019-07-10 DIAGNOSIS — M25562 Pain in left knee: Secondary | ICD-10-CM

## 2019-07-22 ENCOUNTER — Other Ambulatory Visit: Payer: Self-pay

## 2019-07-22 ENCOUNTER — Ambulatory Visit (INDEPENDENT_AMBULATORY_CARE_PROVIDER_SITE_OTHER): Payer: Self-pay | Admitting: Family Medicine

## 2019-07-22 VITALS — BP 142/90 | HR 86 | Ht 64.0 in | Wt 186.1 lb

## 2019-07-22 DIAGNOSIS — F319 Bipolar disorder, unspecified: Secondary | ICD-10-CM

## 2019-07-22 DIAGNOSIS — R350 Frequency of micturition: Secondary | ICD-10-CM

## 2019-07-22 DIAGNOSIS — G8929 Other chronic pain: Secondary | ICD-10-CM

## 2019-07-22 DIAGNOSIS — K209 Esophagitis, unspecified without bleeding: Secondary | ICD-10-CM

## 2019-07-22 DIAGNOSIS — N3000 Acute cystitis without hematuria: Secondary | ICD-10-CM

## 2019-07-22 DIAGNOSIS — M5440 Lumbago with sciatica, unspecified side: Secondary | ICD-10-CM

## 2019-07-22 LAB — POCT URINALYSIS DIP (MANUAL ENTRY)
Bilirubin, UA: NEGATIVE
Blood, UA: NEGATIVE
Glucose, UA: NEGATIVE mg/dL
Ketones, POC UA: NEGATIVE mg/dL
Leukocytes, UA: NEGATIVE
Nitrite, UA: NEGATIVE
Protein Ur, POC: NEGATIVE mg/dL
Spec Grav, UA: <=1.005 — AB (ref 1.010–1.025)
Urobilinogen, UA: 0.2 E.U./dL
pH, UA: 6 (ref 5.0–8.0)
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
## 2019-07-22 NOTE — Patient Instructions (Signed)
Thank you for coming in to see [place] today! Please see below to review our plan for today's visit:  1. Keep taking your Pepcid twice daily - it may not feel like much but this IS helping to prevent damage to your esophagus. I have referred you to Gastroenterology for further evaluation of your Acid Reflux. 2. Take Ibuprofen 200mg  with Tylenol 500mg  3-4 times daily for back pain. Follow up with in 1 week if you have not yet heard from about the referral. 3. We will check your urine for signs of infection - we will call you with results.  Please come back and see [place] about your Migraines!  Please call the clinic at [phone] if your symptoms worsen or you have any concerns. It was our pleasure to serve you!  Dr. [place] [facility]

## 2019-07-25 ENCOUNTER — Other Ambulatory Visit: Payer: Self-pay | Admitting: Family Medicine

## 2019-07-25 DIAGNOSIS — M25561 Pain in right knee: Secondary | ICD-10-CM

## 2019-07-25 DIAGNOSIS — K209 Esophagitis, unspecified without bleeding: Secondary | ICD-10-CM

## 2019-07-25 DIAGNOSIS — M25562 Pain in left knee: Secondary | ICD-10-CM

## 2019-07-28 NOTE — Progress Notes (Deleted)
    SUBJECTIVE:   CHIEF COMPLAINT / HPI:   *** VAGINAL DISCHARGE  Onset: ***   Description: ***  Odor: ***   Itching: ***  Symptoms Dysuria: {YES/NO/WILD CARDS:18581}  Bleeding: {YES/NO/WILD CARDS:18581}  Pelvic pain: {YES/NO/WILD CARDS:18581}  Back pain: {YES/NO/WILD CARDS:18581}  Fever: {YES/NO/WILD CARDS:18581}  Genital sores: {YES/NO/WILD CARDS:18581}  Rash: {YES/NO/WILD CARDS:18581}  Dyspareunia: {YES/NO/WILD CARDS:18581}  GI Sxs: {YES/NO/WILD CARDS:18581}  Prior treatment: {YES/NO/WILD CARDS:18581}   Red Flags: Missed period: {YES/NO/WILD ALPFX:90240}  Pregnancy: {YES/NO/WILD CARDS:18581}  Recent antibiotics: {YES/NO/WILD XBDZH:29924}  Sexual activity: {YES/NO/WILD CARDS:18581}  Possible STD exposure: {YES/NO/WILD CARDS:18581}  IUD: {YES/NO/WILD CARDS:18581}  Diabetes: {YES/NO/WILD CARDS:18581}     PERTINENT  PMH / PSH: ***  OBJECTIVE:   There were no vitals taken for this visit.  ***  ASSESSMENT/PLAN:   No problem-specific Assessment & Plan notes found for this encounter.     Oralia Manis, DO [facility]

## 2019-07-29 ENCOUNTER — Ambulatory Visit: Payer: Medicaid Other | Admitting: Family Medicine

## 2019-07-29 DIAGNOSIS — R350 Frequency of micturition: Secondary | ICD-10-CM | POA: Insufficient documentation

## 2019-07-29 NOTE — Assessment & Plan Note (Signed)
Patient's PHQ 9 score is 17 today.  Does not appear to be in any mania.  Is mostly upset that she continues to have acid reflux, migraines, body aches.  Denies any SI/HI. -Continue to follow-up with psychiatrist, [facility] specialist

## 2019-07-29 NOTE — Assessment & Plan Note (Signed)
Patient reporting urinary frequency, denies burning or urgency with urination.  Was recently prescribed Keflex at 4/15 appointment, however reports that she lost the antibiotic. -Urinalysis at today's visit negative for urinary tract infection -Follow-up if symptoms persist

## 2019-07-29 NOTE — Assessment & Plan Note (Signed)
-   Keep taking Pepcid twice daily - Referral made to gastroenterology for further evaluation

## 2019-07-29 NOTE — Assessment & Plan Note (Signed)
Chronic pain persisting without red flag symptoms.  Imaging normal February 2021 except for concerns for left hip arthritis. -Ibuprofen 200 mg with Tylenol 500 mg 3-4 times daily for back pain with plenty of water.  Need to especially monitor for any signs or symptoms of GI bleed due to patient's history of GERD -Referral made to orthopedics, patient instructed to follow-up with [place] if she has not heard from referral in about 1 week

## 2019-07-31 ENCOUNTER — Ambulatory Visit: Payer: Medicaid Other | Admitting: Family Medicine

## 2019-08-04 ENCOUNTER — Ambulatory Visit: Payer: Medicaid Other | Admitting: Family Medicine

## 2019-08-05 ENCOUNTER — Encounter: Payer: Self-pay | Admitting: Family Medicine

## 2019-08-05 ENCOUNTER — Ambulatory Visit (INDEPENDENT_AMBULATORY_CARE_PROVIDER_SITE_OTHER): Payer: Self-pay | Admitting: Family Medicine

## 2019-08-05 ENCOUNTER — Other Ambulatory Visit: Payer: Self-pay

## 2019-08-05 VITALS — BP 135/80 | HR 101 | Ht 64.5 in | Wt 185.2 lb

## 2019-08-05 DIAGNOSIS — R300 Dysuria: Secondary | ICD-10-CM

## 2019-08-05 DIAGNOSIS — N390 Urinary tract infection, site not specified: Secondary | ICD-10-CM

## 2019-08-05 DIAGNOSIS — N898 Other specified noninflammatory disorders of vagina: Secondary | ICD-10-CM

## 2019-08-05 DIAGNOSIS — R319 Hematuria, unspecified: Secondary | ICD-10-CM

## 2019-08-05 LAB — POCT URINALYSIS DIP (MANUAL ENTRY)
Glucose, UA: NEGATIVE mg/dL
Ketones, POC UA: NEGATIVE mg/dL
Nitrite, UA: NEGATIVE
Protein Ur, POC: 100 mg/dL — AB
Spec Grav, UA: 1.025 (ref 1.010–1.025)
Urobilinogen, UA: 1 E.U./dL
pH, UA: 6 (ref 5.0–8.0)

## 2019-08-05 LAB — POCT WET PREP (WET MOUNT)
Clue Cells Wet Prep Whiff POC: POSITIVE
WBC, Wet Prep HPF POC: >20

## 2019-08-05 NOTE — Patient Instructions (Signed)
Was great seeing you today!  We performed a urine test to see how your urine looks as well as to evaluate your back pain.  I will give you call with these results when they come back.  We also did a wet prep, which shows if you have yeast, BV, trichomoniasis.  On top of that we did opt to get the blood work for syphilis and HIV testing.  I will give you call with these results, some of these should come back this afternoon.

## 2019-08-06 ENCOUNTER — Telehealth: Payer: Self-pay

## 2019-08-06 ENCOUNTER — Other Ambulatory Visit: Payer: Self-pay | Admitting: Orthopedic Surgery

## 2019-08-06 DIAGNOSIS — N390 Urinary tract infection, site not specified: Secondary | ICD-10-CM | POA: Insufficient documentation

## 2019-08-06 DIAGNOSIS — M25561 Pain in right knee: Secondary | ICD-10-CM

## 2019-08-06 LAB — RPR: RPR Ser Ql: NONREACTIVE

## 2019-08-06 LAB — HIV ANTIBODY (ROUTINE TESTING W REFLEX): HIV Screen 4th Generation wRfx: NONREACTIVE

## 2019-08-06 MED ORDER — METRONIDAZOLE 500 MG PO TABS
500.0000 mg | ORAL_TABLET | Freq: Two times a day (BID) | ORAL | 0 refills | Status: DC
Start: 2019-08-06 — End: 2019-09-03

## 2019-08-06 MED ORDER — CEPHALEXIN 500 MG PO CAPS
500.0000 mg | ORAL_CAPSULE | Freq: Three times a day (TID) | ORAL | 0 refills | Status: DC
Start: 2019-08-06 — End: 2019-09-03

## 2019-08-06 NOTE — Assessment & Plan Note (Signed)
Positive for trichomoniasis and BV on wet prep.  Will treat with Flagyl 500 mg twice daily for 7 days.  Recommended patient get her partner tested and for their treatment as well.

## 2019-08-06 NOTE — Telephone Encounter (Signed)
LVM for patient to return my call in the morning.

## 2019-08-06 NOTE — Assessment & Plan Note (Signed)
Large blood, 3+ leukocytes.  Will treat with Keflex 500 mg 3 times daily for 5 days.

## 2019-08-06 NOTE — Progress Notes (Signed)
   CHIEF COMPLAINT / HPI: 44 year old female who presents for dysuria and vaginal discharge.  Patient states that the symptoms have been going on for a few days now.  His that she is interested in getting her urine and STD testing.  Patient did not want GC/chlamydia testing, but did not want wet prep and blood testing for RPR and HIV.  PERTINENT  PMH / PSH:    OBJECTIVE: BP 135/80   Pulse (!) 101   Ht 5' 4.5" (1.638 m)   Wt 185 lb 3.2 oz (84 kg)   SpO2 99%   BMI 31.30 kg/m   Gen: 44 year old female, no acute distress, resting comfortably CV: Skin warm and dry Resp: No accessory muscle use, no respiratory distress Neuro: Alert and oriented, Speech clear, No gross deficits  GU: Thick clear discharge noted on wet prep.  Wet prep: Positive for trichomoniasis, few clue cells, positive whiff UA: Moderate bilirubin, large blood, protein equal 100, large leuks  ASSESSMENT / PLAN:  Vaginal discharge Positive for trichomoniasis and BV on wet prep.  Will treat with Flagyl 500 mg twice daily for 7 days.  Recommended patient get her partner tested and for their treatment as well.  UTI (urinary tract infection) Large blood, 3+ leukocytes.  Will treat with Keflex 500 mg 3 times daily for 5 days.     Myrene Buddy MD PGY-3 Family Medicine Resident [facility]

## 2019-08-06 NOTE — Telephone Encounter (Signed)
Will treat with flagyl 500mg  bid for 7 days. It looks like she also has UTI on her UA so will treat with keflex 500mg  tid for 5 days.  MD PGY-3 Family Medicine Resident

## 2019-08-06 NOTE — Telephone Encounter (Signed)
Patient calls nurse line requesting results from yesterdays apt. She test positive for BV and Trichomonas it looks like. I can relay the message, just let me know the treatment plan to her pharmacy. Will forward to provider who saw patient.

## 2019-08-12 ENCOUNTER — Other Ambulatory Visit: Payer: Self-pay | Admitting: Family Medicine

## 2019-08-12 DIAGNOSIS — K209 Esophagitis, unspecified without bleeding: Secondary | ICD-10-CM

## 2019-08-19 NOTE — Addendum Note (Signed)
Addended by: Steva Colder on: 08/19/2019 09:37 AM   Modules accepted: Orders

## 2019-08-19 NOTE — Telephone Encounter (Signed)
I finally was able to get a hold of patient to inform her of results. Patient informed of UTI and +Trich. Patient stated her pharmacy called her about a week ago and she has already picked up and taken all antibiotics, Keflex and Flagyl. Patient advised to tell partner(s) and refrain from intercourse until all parties treated. Patient reports she has not been active for a while, but voiced understanding. Patient reports she now has a yeast infection from antibiotic use. Please send in 1 diflucan to her pharmacy.

## 2019-08-19 NOTE — Telephone Encounter (Signed)
Patient returns call to nurse line to report strong smell to urine this morning. Patient denies any other symptoms at this time. Asked patient if she had continued to notice odor all day, patient states she has not voided since this AM. Advised patient to increase water intake to flush out kidneys and to call back if odor is persistent or new symptoms develop.   Patient also wants to check status of receiving diflucan.   To prescribing doctor  Veronda Prude, RN

## 2019-08-20 MED ORDER — FLUCONAZOLE 150 MG PO TABS: 150.0000 mg | ORAL_TABLET | Freq: Once | ORAL | 0 refills | Status: AC

## 2019-08-20 NOTE — Telephone Encounter (Signed)
Sent in diflucan to pharmacy  Myrene Buddy MD PGY-3 Family Medicine Resident

## 2019-08-20 NOTE — Addendum Note (Signed)
Addended by: Delman Cheadle on: 08/20/2019 11:32 AM   Modules accepted: Orders

## 2019-08-21 NOTE — Telephone Encounter (Signed)
Patient calls nurse line stating her UTI symptoms are still persistent despite finishing antibiotic. A culture was not done for sensitivity, therefore I have scheduled the patient a FU visit next week, per her request. Patient reports a foul odor to her urine all day and darker color. Patient advised to drink plenty of water. ED precautions given.

## 2019-08-25 ENCOUNTER — Other Ambulatory Visit: Payer: Self-pay | Admitting: Family Medicine

## 2019-08-25 DIAGNOSIS — K209 Esophagitis, unspecified without bleeding: Secondary | ICD-10-CM

## 2019-08-25 DIAGNOSIS — M25561 Pain in right knee: Secondary | ICD-10-CM

## 2019-08-26 ENCOUNTER — Encounter: Payer: Self-pay | Admitting: Family Medicine

## 2019-08-26 ENCOUNTER — Ambulatory Visit (INDEPENDENT_AMBULATORY_CARE_PROVIDER_SITE_OTHER): Payer: 59 | Admitting: Family Medicine

## 2019-08-26 ENCOUNTER — Other Ambulatory Visit: Payer: Self-pay

## 2019-08-26 VITALS — BP 148/88 | HR 110 | Ht 64.0 in | Wt 181.0 lb

## 2019-08-26 DIAGNOSIS — N390 Urinary tract infection, site not specified: Secondary | ICD-10-CM | POA: Diagnosis not present

## 2019-08-26 DIAGNOSIS — R300 Dysuria: Secondary | ICD-10-CM | POA: Diagnosis not present

## 2019-08-26 LAB — POCT URINALYSIS DIP (MANUAL ENTRY)
Glucose, UA: NEGATIVE mg/dL
Ketones, POC UA: NEGATIVE mg/dL
Nitrite, UA: POSITIVE — AB
Protein Ur, POC: 30 mg/dL — AB
Spec Grav, UA: >=1.03 — AB (ref 1.010–1.025)
Urobilinogen, UA: 1 E.U./dL
pH, UA: 5.5 (ref 5.0–8.0)

## 2019-08-26 LAB — POCT UA - MICROSCOPIC ONLY: Epithelial cells, urine per micros: >20

## 2019-08-26 MED ORDER — SULFAMETHOXAZOLE-TRIMETHOPRIM 400-80 MG PO TABS: 2.0000 | ORAL_TABLET | Freq: Two times a day (BID) | ORAL | 0 refills | Status: AC

## 2019-08-26 MED ORDER — FLUCONAZOLE 150 MG PO TABS
150.0000 mg | ORAL_TABLET | Freq: Once | ORAL | 0 refills | Status: AC
Start: 2019-08-26 — End: 2019-08-26

## 2019-08-26 NOTE — Progress Notes (Signed)
    SUBJECTIVE:   CHIEF COMPLAINT / HPI:   DYSURIA  Pain urinating started 4 weeks ago. Pain is:  back pain Medications tried: ibuprofen, gabapentin Any antibiotics in the last 30 days: Y, keflex More than 3 UTIs in the last 12 months: N STD exposure: Trichomonas in 07/2019 Possibly pregnant: Pt s/p uterine ablation, no longer having periods  Symptoms Urgency: Y Frequency: Y Blood in urine: N Pain in back:Y Fever: N Vaginal discharge: N Mouth Ulcers: N  Denies nausea, vomiting   Review of Symptoms - see HPI  PERTINENT  PMH / PSH:   OBJECTIVE:   BP (!) 148/88   Pulse (!) 110   Ht 5\' 4"  (1.626 m)   Wt 181 lb (82.1 kg)   SpO2 97%   BMI 31.07 kg/m   Gen: NAD, resting comfortably CV: RRR with no murmurs appreciated Pulm: NWOB, CTAB with no crackles, wheezes, or rhonchi GI:  Soft, Nontender, Nondistended, no CVA tenderness MSK: no edema, cyanosis, or clubbing noted Skin: warm, dry Neuro: grossly normal, moves all extremities Psych: Normal affect and thought content  ASSESSMENT/PLAN:   UTI (urinary tract infection) UA is consistent with cystitis.  Unsure if recurrence of symptoms is just due to incomplete treatment of prior UTI versus new infection.  Will send urine for culture to ensure that susceptibilities were obtained.  We will do a longer course ensure clearance. -Bactrim for 14 days     , MD [facility]

## 2019-08-26 NOTE — Assessment & Plan Note (Signed)
UA is consistent with cystitis.  Unsure if recurrence of symptoms is just due to incomplete treatment of prior UTI versus new infection.  Will send urine for culture to ensure that susceptibilities were obtained.  We will do a longer course ensure clearance. -Bactrim for 14 days

## 2019-08-26 NOTE — Patient Instructions (Signed)
Urinary Tract Infection, Adult A urinary tract infection (UTI) is an infection of any part of the urinary tract. The urinary tract includes:  The kidneys.  The ureters.  The bladder.  The urethra. These organs make, store, and get rid of pee (urine) in the body. What are the causes? This is caused by germs (bacteria) in your genital area. These germs grow and cause swelling (inflammation) of your urinary tract. What increases the risk? You are more likely to develop this condition if:  You have a small, thin tube (catheter) to drain pee.  You cannot control when you pee or poop (incontinence).  You are female, and: ? You use these methods to prevent pregnancy:  A medicine that kills sperm (spermicide).  A device that blocks sperm (diaphragm). ? You have low levels of a female hormone (estrogen). ? You are pregnant.  You have genes that add to your risk.  You are sexually active.  You take antibiotic medicines.  You have trouble peeing because of: ? A prostate that is bigger than normal, if you are female. ? A blockage in the part of your body that drains pee from the bladder (urethra). ? A kidney stone. ? A nerve condition that affects your bladder (neurogenic bladder). ? Not getting enough to drink. ? Not peeing often enough.  You have other conditions, such as: ? Diabetes. ? A weak disease-fighting system (immune system). ? Sickle cell disease. ? Gout. ? Injury of the spine. What are the signs or symptoms? Symptoms of this condition include:  Needing to pee right away (urgently).  Peeing often.  Peeing small amounts often.  Pain or burning when peeing.  Blood in the pee.  Pee that smells bad or not like normal.  Trouble peeing.  Pee that is cloudy.  Fluid coming from the vagina, if you are female.  Pain in the belly or lower back. Other symptoms include:  Throwing up (vomiting).  No urge to eat.  Feeling mixed up (confused).  Being tired  and grouchy (irritable).  A fever.  Watery poop (diarrhea). How is this treated? This condition may be treated with:  Antibiotic medicine.  Other medicines.  Drinking enough water. Follow these instructions at home:  Medicines  Take over-the-counter and prescription medicines only as told by your doctor.  If you were prescribed an antibiotic medicine, take it as told by your doctor. Do not stop taking it even if you start to feel better. General instructions  Make sure you: ? Pee until your bladder is empty. ? Do not hold pee for a long time. ? Empty your bladder after sex. ? Wipe from front to back after pooping if you are a female. Use each tissue one time when you wipe.  Drink enough fluid to keep your pee pale yellow.  Keep all follow-up visits as told by your doctor. This is important. Contact a doctor if:  You do not get better after 1-2 days.  Your symptoms go away and then come back. Get help right away if:  You have very bad back pain.  You have very bad pain in your lower belly.  You have a fever.  You are sick to your stomach (nauseous).  You are throwing up. Summary  A urinary tract infection (UTI) is an infection of any part of the urinary tract.  This condition is caused by germs in your genital area.  There are many risk factors for a UTI. These include having a small, thin   tube to drain pee and not being able to control when you pee or poop.  Treatment includes antibiotic medicines for germs.  Drink enough fluid to keep your pee pale yellow. This information is not intended to replace advice given to you by your health care provider. Make sure you discuss any questions you have with your health care provider. Document Revised: 02/14/2018 Document Reviewed: 09/06/2017 Elsevier Patient Education  2020 [employer]  

## 2019-08-30 ENCOUNTER — Other Ambulatory Visit: Payer: Self-pay

## 2019-08-30 ENCOUNTER — Encounter (HOSPITAL_COMMUNITY): Payer: Self-pay | Admitting: *Deleted

## 2019-08-30 ENCOUNTER — Emergency Department (HOSPITAL_COMMUNITY)
Admission: EM | Admit: 2019-08-30 | Discharge: 2019-08-30 | Discharge status: Against Medical Advice | Payer: 59 | Attending: Emergency Medicine | Admitting: Emergency Medicine

## 2019-08-30 DIAGNOSIS — Z888 Allergy status to other drugs, medicaments and biological substances status: Secondary | ICD-10-CM | POA: Diagnosis not present

## 2019-08-30 DIAGNOSIS — Z79899 Other long term (current) drug therapy: Secondary | ICD-10-CM | POA: Insufficient documentation

## 2019-08-30 DIAGNOSIS — R030 Elevated blood-pressure reading, without diagnosis of hypertension: Secondary | ICD-10-CM

## 2019-08-30 DIAGNOSIS — Z87891 Personal history of nicotine dependence: Secondary | ICD-10-CM | POA: Diagnosis not present

## 2019-08-30 DIAGNOSIS — R112 Nausea with vomiting, unspecified: Secondary | ICD-10-CM | POA: Insufficient documentation

## 2019-08-30 MED ORDER — FAMOTIDINE 20 MG PO TABS
20.0000 mg | ORAL_TABLET | Freq: Once | ORAL | Status: DC
  Filled 2019-08-30: qty 1

## 2019-08-30 MED ORDER — ONDANSETRON 8 MG PO TBDP
8.0000 mg | ORAL_TABLET | Freq: Once | ORAL | Status: DC
  Filled 2019-08-30: qty 1

## 2019-08-30 MED ORDER — ALUM & MAG HYDROXIDE-SIMETH 200-200-20 MG/5ML PO SUSP: 30.0000 mL | Freq: Once | ORAL | Status: DC

## 2019-08-30 NOTE — ED Triage Notes (Addendum)
Pt states she has been vomiting for about 3 weeks, starts in am with cough followed by vomiting or when she coughs, she has GERD and was hospitalized due to GERD with inflammation to the esophagus. Vomits about 3 times a day. Reports burning in throat due to vomiting

## 2019-08-31 NOTE — ED Provider Notes (Signed)
[name]-EMERGENCY DEPT Provider Note   CSN: [id number] Arrival date & time: 08/30/19  1332     History Chief Complaint  Patient presents with  . Emesis    Kristin [name] is a 44 y.o. female.  Patient c/o intermittent nausea/vomiting in the past three weeks. Symptoms acute onset, episodic, recurrent, moderate, 1-3 episodes per day. No bloody or bilious emesis. No abd distension. Having normal bms. Currently, denies abd or flank pain. Denies any recent chest pain or discomfort. No sob or unusual doe. No fever or chills. No headaches.   The history is provided by the patient.  Emesis Associated symptoms: no abdominal pain, no chills, no cough, no fever, no headaches and no sore throat        Past Medical History:  Diagnosis Date  . Acid reflux   . Anxiety   . Arthritis   . Depression   . Eczema   . Hypertension   . Migraine headache     Patient Active Problem List   Diagnosis Date Noted  . UTI (urinary tract infection) 08/06/2019  . Frequent urination 07/29/2019  . Anxiety 07/07/2019  . Suicidal ideation 08/08/2018  . Vaginal irritation 03/07/2018  . Acute cystitis without hematuria 11/09/2017  . Injury of toe on right foot 10/16/2017  . Esophagitis determined by endoscopy   . Dehydration   . Nausea & vomiting 09/18/2017  . AKI (acute kidney injury) (HCC)   . Bipolar disorder in remission (HCC)   . Back pain 08/25/2016  . Generalized anxiety disorder 08/23/2015  . Osteoarthritis of right knee 12/21/2014  . Dysuria 04/22/2014  . Vaginal discharge 04/22/2014  . Tinea pedis of both feet 10/02/2013  . Menorrhagia 09/04/2012  . Allergic rhinitis 06/12/2012  . Eczema 03/26/2012  . Alcohol abuse 10/26/2010  . Bipolar 1 disorder (HCC) 10/09/2010  . Panic attacks 10/09/2010  . INSOMNIA, CHRONIC 06/29/2009  . TOBACCO USER 01/23/2009  . Obesity 10/29/2008  . Essential hypertension 12/20/2006    Past Surgical History:  Procedure Laterality  Date  . BIOPSY  09/19/2017   Procedure: BIOPSY;  Surgeon: Kathi Der, MD;  Location: [facility] ENDOSCOPY;  Service: Gastroenterology;;  . [name] & CURRETTAGE/HYSTROSCOPY WITH HYDROTHERMAL ABLATION N/A 04/04/2017   Procedure: DILATATION & CURETTAGE/HYSTEROSCOPY WITH HYDROTHERMAL ABLATION;  Surgeon: Reva Bores, MD;  Location:  SURGERY CENTER;  Service: Gynecology;  Laterality: N/A;  . ESOPHAGOGASTRODUODENOSCOPY (EGD) WITH PROPOFOL N/A 09/19/2017   Procedure: ESOPHAGOGASTRODUODENOSCOPY (EGD) WITH PROPOFOL;  Surgeon: Kathi Der, MD;  Location: [facility] ENDOSCOPY;  Service: Gastroenterology;  Laterality: N/A;  . TUBAL LIGATION       OB History    Gravida  4   Para  3   Term  3   Preterm      AB  1   Living  3     SAB      TAB  1   Ectopic      Multiple      Live Births              Family History  Problem Relation Age of Onset  . Cancer Maternal Grandmother        Breast  . Hypertension Mother   . Hypertension Brother   . Cancer Maternal Uncle        colon  . Bipolar disorder Cousin   . Schizophrenia Cousin     Social History   Tobacco Use  . Smoking status: Former Smoker    Packs/day: 0.25  Years: 5.00    Pack years: 1.25    Types: Cigarettes  . Smokeless tobacco: Never Used  Vaping Use  . Vaping Use: Never used  Substance Use Topics  . Alcohol use: Yes    Comment: occassionally   . Drug use: No    Home Medications Prior to Admission medications   Medication Sig Start Date End Date Taking? Authorizing Provider  acetaminophen (TYLENOL) 500 MG tablet Take 1 tablet (500 mg total) by mouth every 8 (eight) hours as needed for mild pain. 07/04/19   Sherene Sires, DO  cephALEXin (KEFLEX) 500 MG capsule Take 1 capsule (500 mg total) by mouth 3 (three) times daily. 08/06/19   Guadalupe Dawn, MD  famotidine (PEPCID) 20 MG tablet TAKE 1 TABLET(20 MG) BY MOUTH TWICE DAILY 08/25/19   Sherene Sires, DO  gabapentin (NEURONTIN) 300 MG capsule Take 1  capsule (300 mg total) by mouth 3 (three) times daily. 06/25/19   Zenia Resides, MD  hydrOXYzine (VISTARIL) 25 MG capsule Take 1 capsule (25 mg total) by mouth daily as needed for anxiety. 07/04/19   Sherene Sires, DO  ibuprofen (ADVIL) 600 MG tablet TAKE 1 TABLET(600 MG) BY MOUTH TWICE DAILY AS NEEDED 08/25/19   Sherene Sires, DO  lisinopril-hydrochlorothiazide (ZESTORETIC) 10-12.5 MG tablet Take 1 tablet by mouth daily. 07/04/19   Sherene Sires, DO  metroNIDAZOLE (FLAGYL) 500 MG tablet Take 1 tablet (500 mg total) by mouth 2 (two) times daily. 08/06/19   Guadalupe Dawn, MD  OLANZapine zydis (ZYPREXA ZYDIS) 5 MG disintegrating tablet Take 1 tablet (5 mg total) by mouth at bedtime. 07/04/19   Sherene Sires, DO  sulfamethoxazole-trimethoprim (BACTRIM) 400-80 MG tablet Take 2 tablets by mouth 2 (two) times daily for 14 days. 08/26/19 09/09/19  Bonnita Hollow, MD  tiZANidine (ZANAFLEX) 4 MG tablet Take 1-2 tablets (4-8 mg total) by mouth every 6 (six) hours as needed for muscle spasms. 07/04/19   Sherene Sires, DO  triamcinolone ointment (KENALOG) 0.1 % Apply 1 application topically 2 (two) times daily. 06/25/19   Zenia Resides, MD  fluticasone (FLONASE) 50 MCG/ACT nasal spray Place 2 sprays into both nostrils daily. Patient taking differently: Place 2 sprays into both nostrils daily as needed for allergies.  02/25/17 04/14/19  Ok Edwards, PA-C  promethazine (PHENERGAN) 25 MG tablet Take 1 tablet (25 mg total) by mouth every 6 (six) hours as needed for nausea or vomiting. 04/17/18 08/29/18  Isla Pence, MD    Allergies    Sumatriptan  Review of Systems   Review of Systems  Constitutional: Negative for chills and fever.  HENT: Negative for sore throat.   Eyes: Negative for redness.  Respiratory: Negative for cough and shortness of breath.   Cardiovascular: Negative for chest pain.  Gastrointestinal: Positive for nausea and vomiting. Negative for abdominal pain.  Genitourinary: Negative for dysuria  and flank pain.  Musculoskeletal: Negative for back pain and neck pain.  Skin: Negative for rash.  Neurological: Negative for headaches.  Hematological: Does not bruise/bleed easily.  Psychiatric/Behavioral: Negative for confusion.    Physical Exam Updated Vital Signs BP (!) 165/112 (BP Location: Left Arm) Comment: pt staed hasnt took medication today  Pulse 93   Temp 98.1 F (36.7 C) (Oral)   Resp 18   SpO2 97%   Physical Exam Vitals and nursing note reviewed.  Constitutional:      Appearance: Normal appearance. She is well-developed.  HENT:     Head: Atraumatic.     Nose:  Nose normal.     Mouth/Throat:     Mouth: Mucous membranes are moist.  Eyes:     General: No scleral icterus.    Conjunctiva/sclera: Conjunctivae normal.  Neck:     Trachea: No tracheal deviation.  Cardiovascular:     Rate and Rhythm: Normal rate and regular rhythm.     Pulses: Normal pulses.     Heart sounds: Normal heart sounds. No murmur heard.  No friction rub. No gallop.   Pulmonary:     Effort: Pulmonary effort is normal. No respiratory distress.     Breath sounds: Normal breath sounds.  Abdominal:     General: Bowel sounds are normal. There is no distension.     Palpations: Abdomen is soft. There is no mass.     Tenderness: There is no abdominal tenderness. There is no guarding or rebound.     Hernia: No hernia is present.  Genitourinary:    Comments: No cva tenderness.  Musculoskeletal:        General: No swelling.     Cervical back: Normal range of motion and neck supple. No rigidity. No muscular tenderness.     Right lower leg: No edema.     Left lower leg: No edema.  Skin:    General: Skin is warm and dry.     Findings: No rash.  Neurological:     Mental Status: She is alert.     Comments: Alert, speech normal.   Psychiatric:        Mood and Affect: Mood normal.     ED Results / Procedures / Treatments   Labs (all labs ordered are listed, but only abnormal results are  displayed)  EKG None  Radiology No results found.  Procedures Procedures (including critical care time)  Medications Ordered in ED Medications - No data to display  ED Course  I have reviewed the triage vital signs and the nursing notes.  Pertinent labs & imaging results that were available during my care of the patient were reviewed by me and considered in my medical decision making (see chart for details).    MDM Rules/Calculators/A&P                         Labs ordered. Zofran. Po fluids.   Reviewed nursing notes and prior charts for additional history.   I went to reassess pt - pt had left ED AMA without completion of ED evaluation and treatment without notifying myself or medical staff.     Final Clinical Impression(s) / ED Diagnoses Final diagnoses:  None    Rx / DC Orders ED Discharge Orders    None       Cathren Laine, MD 08/31/19 1319

## 2019-09-02 ENCOUNTER — Emergency Department (HOSPITAL_COMMUNITY)
Admission: EM | Admit: 2019-09-02 | Discharge: 2019-09-03 | Disposition: A | Discharge status: Home / Self Care | Payer: 59 | Attending: Emergency Medicine | Admitting: Emergency Medicine

## 2019-09-02 ENCOUNTER — Encounter (HOSPITAL_COMMUNITY): Payer: Self-pay | Admitting: *Deleted

## 2019-09-02 ENCOUNTER — Other Ambulatory Visit: Payer: Self-pay

## 2019-09-02 DIAGNOSIS — R112 Nausea with vomiting, unspecified: Secondary | ICD-10-CM | POA: Diagnosis not present

## 2019-09-02 DIAGNOSIS — N3000 Acute cystitis without hematuria: Secondary | ICD-10-CM | POA: Diagnosis not present

## 2019-09-02 DIAGNOSIS — I10 Essential (primary) hypertension: Secondary | ICD-10-CM | POA: Diagnosis not present

## 2019-09-02 DIAGNOSIS — Z87891 Personal history of nicotine dependence: Secondary | ICD-10-CM | POA: Insufficient documentation

## 2019-09-02 DIAGNOSIS — E876 Hypokalemia: Secondary | ICD-10-CM | POA: Insufficient documentation

## 2019-09-02 DIAGNOSIS — Z79899 Other long term (current) drug therapy: Secondary | ICD-10-CM | POA: Diagnosis not present

## 2019-09-02 LAB — COMPREHENSIVE METABOLIC PANEL
ALT: 12 U/L (ref 0–44)
AST: 66 U/L — ABNORMAL HIGH (ref 15–41)
Albumin: 4.1 g/dL (ref 3.5–5.0)
Alkaline Phosphatase: 101 U/L (ref 38–126)
Anion gap: 16 — ABNORMAL HIGH (ref 5–15)
BUN: 8 mg/dL (ref 6–20)
CO2: 22 mmol/L (ref 22–32)
Calcium: 8.9 mg/dL (ref 8.9–10.3)
Chloride: 99 mmol/L (ref 98–111)
Creatinine, Ser: 0.73 mg/dL (ref 0.44–1.00)
GFR calc Af Amer: >60 mL/min (ref >60)
GFR calc non Af Amer: >60 mL/min (ref >60)
Glucose, Bld: 111 mg/dL — ABNORMAL HIGH (ref 70–99)
Potassium: 2.8 mmol/L — ABNORMAL LOW (ref 3.5–5.1)
Sodium: 137 mmol/L (ref 135–145)
Total Bilirubin: 2.3 mg/dL — ABNORMAL HIGH (ref 0.3–1.2)
Total Protein: 7 g/dL (ref 6.5–8.1)

## 2019-09-02 LAB — CBC
HCT: 35 % — ABNORMAL LOW (ref 36.0–46.0)
Hemoglobin: 12.1 g/dL (ref 12.0–15.0)
MCH: 31.7 pg (ref 26.0–34.0)
MCHC: 34.6 g/dL (ref 30.0–36.0)
MCV: 91.6 fL (ref 80.0–100.0)
Platelets: 156 10*3/uL (ref 150–400)
RBC: 3.82 MIL/uL — ABNORMAL LOW (ref 3.87–5.11)
RDW: 13.1 % (ref 11.5–15.5)
WBC: 5.1 10*3/uL (ref 4.0–10.5)
nRBC: 0 % (ref 0.0–0.2)

## 2019-09-02 LAB — I-STAT BETA HCG BLOOD, ED (MC, WL, AP ONLY): I-stat hCG, quantitative: <5 m[IU]/mL (ref <5)

## 2019-09-02 LAB — LIPASE, BLOOD: Lipase: 28 U/L (ref 11–51)

## 2019-09-02 MED ORDER — SODIUM CHLORIDE 0.9% FLUSH: 3.0000 mL | Freq: Once | INTRAVENOUS | Status: DC

## 2019-09-02 NOTE — ED Triage Notes (Addendum)
To ED for eval of n/v for past 3 wks. Seen by pcp and given pepcid which isn't working. Some diarrhea. Hx of reflux. States she is not able to keep any food or drink down. States she has been under treatment for UTI from [facility] - states she was just placed on a stronger abx but 'probably not in my system' because of vomiting.

## 2019-09-03 ENCOUNTER — Other Ambulatory Visit: Payer: Self-pay

## 2019-09-03 LAB — URINALYSIS, ROUTINE W REFLEX MICROSCOPIC
Glucose, UA: NEGATIVE mg/dL
Ketones, ur: NEGATIVE mg/dL
Nitrite: NEGATIVE
Protein, ur: >=300 mg/dL — AB
Specific Gravity, Urine: 1.026 (ref 1.005–1.030)
WBC, UA: >50 WBC/hpf — ABNORMAL HIGH (ref 0–5)
pH: 5 (ref 5.0–8.0)

## 2019-09-03 LAB — MAGNESIUM: Magnesium: 1.2 mg/dL — ABNORMAL LOW (ref 1.7–2.4)

## 2019-09-03 MED ORDER — MAGNESIUM OXIDE 400 MG PO TABS: 400.0000 mg | ORAL_TABLET | Freq: Every day | ORAL | 0 refills | Status: DC

## 2019-09-03 MED ORDER — SODIUM CHLORIDE 0.9 % IV BOLUS
1000.0000 mL | Freq: Once | INTRAVENOUS | Status: AC
  Administered 2019-09-03: 1000 mL via INTRAVENOUS

## 2019-09-03 MED ORDER — MAGNESIUM OXIDE 400 (241.3 MG) MG PO TABS
400.0000 mg | ORAL_TABLET | Freq: Once | ORAL | Status: AC
  Administered 2019-09-03: 400 mg via ORAL
  Filled 2019-09-03: qty 1

## 2019-09-03 MED ORDER — POTASSIUM CHLORIDE 10 MEQ/100ML IV SOLN
10.0000 meq | INTRAVENOUS | Status: AC
  Administered 2019-09-03 (×2): 10 meq via INTRAVENOUS
  Filled 2019-09-03 (×2): qty 100

## 2019-09-03 MED ORDER — ONDANSETRON HCL 4 MG/2ML IJ SOLN
4.0000 mg | Freq: Once | INTRAMUSCULAR | Status: AC
  Administered 2019-09-03: 4 mg via INTRAVENOUS
  Filled 2019-09-03: qty 2

## 2019-09-03 MED ORDER — PANTOPRAZOLE SODIUM 40 MG IV SOLR
40.0000 mg | Freq: Once | INTRAVENOUS | Status: AC
  Administered 2019-09-03: 40 mg via INTRAVENOUS
  Filled 2019-09-03: qty 40

## 2019-09-03 MED ORDER — MAGNESIUM SULFATE 2 GM/50ML IV SOLN: 2.0000 g | Freq: Once | INTRAVENOUS | Status: DC

## 2019-09-03 MED ORDER — OMEPRAZOLE 20 MG PO CPDR
DELAYED_RELEASE_CAPSULE | ORAL | 0 refills | Status: DC
Start: 2019-09-03 — End: 2019-09-23

## 2019-09-03 MED ORDER — SODIUM CHLORIDE 0.9 % IV SOLN
1.0000 g | Freq: Once | INTRAVENOUS | Status: AC
  Administered 2019-09-03: 1 g via INTRAVENOUS
  Filled 2019-09-03: qty 10

## 2019-09-03 MED ORDER — SUCRALFATE 1 GM/10ML PO SUSP
1.0000 g | Freq: Three times a day (TID) | ORAL | 0 refills | Status: DC
Start: 2019-09-03 — End: 2019-09-22

## 2019-09-03 MED ORDER — POTASSIUM CHLORIDE CRYS ER 20 MEQ PO TBCR
40.0000 meq | EXTENDED_RELEASE_TABLET | Freq: Once | ORAL | Status: AC
  Administered 2019-09-03: 40 meq via ORAL
  Filled 2019-09-03: qty 2

## 2019-09-03 MED ORDER — ONDANSETRON 4 MG PO TBDP
4.0000 mg | ORAL_TABLET | Freq: Three times a day (TID) | ORAL | 0 refills | Status: DC | PRN
Start: 2019-09-03 — End: 2019-09-23

## 2019-09-03 MED ORDER — POTASSIUM CHLORIDE CRYS ER 20 MEQ PO TBCR: 20.0000 meq | EXTENDED_RELEASE_TABLET | Freq: Two times a day (BID) | ORAL | 0 refills | Status: DC

## 2019-09-03 NOTE — Discharge Instructions (Signed)
Please read and follow all provided instructions.  Your diagnoses today include:  1. Non-intractable vomiting with nausea, unspecified vomiting type   2. Hypokalemia   3. Hypomagnesemia   4. Acute cystitis without hematuria     Tests performed today include:  Blood counts and electrolytes - shows low potassium and magnesium  Blood tests to check liver and kidney function  Urine test to look for infection - shows continued infection  Vital signs. See below for your results today.   Medications prescribed:   Omeprazole (Prilosec) - stomach acid reducer  This medication can be found over-the-counter   Carafate - for stomach upset and to protect your stomach   Zofran (ondansetron) - for nausea and vomiting  Take any prescribed medications only as directed.  Home care instructions:   Follow any educational materials contained in this packet.   You should rest for the next several days. Keep drinking plenty of fluids and use the medicine for nausea as directed.    Drink clear liquids for the next 24 hours and introduce solid foods slowly after 24 hours using the b.r.a.t. diet (Bananas, Rice, Applesauce, Toast, Yogurt).      Follow-up instructions: Please follow-up with your primary care provider in the next 2 days for further evaluation of your symptoms and recheck of your electrolytes.   Return instructions:  SEEK IMMEDIATE MEDICAL ATTENTION IF:  If you have pain that does not go away or becomes severe   A temperature above 101F develops   Repeated vomiting occurs (multiple episodes)   If you have pain that becomes localized to portions of the abdomen. The right side could possibly be appendicitis. In an adult, the left lower portion of the abdomen could be colitis or diverticulitis.   Blood is being passed in stools or vomit (bright red or black tarry stools)   You develop chest pain, difficulty breathing, dizziness or fainting, or become confused, poorly  responsive, or inconsolable (young children)  If you have any other emergent concerns regarding your health   Your vital signs today were: BP (!) 139/96   Pulse 98   Temp 98.1 F (36.7 C) (Oral)   Resp 15   Ht 5\' 4"  (1.626 m)   Wt 82.1 kg   SpO2 100%   BMI 31.07 kg/m  If your blood pressure (bp) was elevated above 135/85 this visit, please have this repeated by your doctor within one month. --------------

## 2019-09-03 NOTE — ED Provider Notes (Signed)
[facility] EMERGENCY DEPARTMENT Provider Note   CSN: [id number] Arrival date & time: 09/02/19  1718     History No chief complaint on file.   [name] is a 44 y.o. female.  Patient with history of esophagitis/gastritis, admission for this in 09/2017 --presents to the emergency department with complaint of 3 weeks of vomiting as well as sore throat and epigastric pain.  Patient states that she has been vomiting approximately three times per day.  She has been vomiting pretty consistently in the mornings.  She states that eating and drinking makes her nausea worse.  She has been able to keep down small fluid boluses.  She currently takes Pepcid twice a day but does not think this has been helping.  Per review of records in epic, she was prescribed Carafate and twice daily PPI after discharge from hospital in 2019.  She uses ibuprofen sparingly.  She drinks alcohol approximately once a week.  She is a former smoker.  Patient has been prescribed several recent courses of antibiotics for UTI.  She reports odor to the urine and increased urinary frequency.  She completed a course of Keflex and metronidazole in May.  She was prescribed Bactrim about a week ago by PCP.  She states that she took two doses of this yesterday but vomited afterwards.  She has not been taking it regularly.  No fevers or back pain.  She has has noticed dysuria over the past 2 days.        Past Medical History:  Diagnosis Date  . Acid reflux   . Anxiety   . Arthritis   . Depression   . Eczema   . Hypertension   . Migraine headache     Patient Active Problem List   Diagnosis Date Noted  . UTI (urinary tract infection) 08/06/2019  . Frequent urination 07/29/2019  . Anxiety 07/07/2019  . Suicidal ideation 08/08/2018  . Vaginal irritation 03/07/2018  . Acute cystitis without hematuria 11/09/2017  . Injury of toe on right foot 10/16/2017  . Esophagitis determined by endoscopy   .  Dehydration   . Nausea & vomiting 09/18/2017  . AKI (acute kidney injury) (HCC)   . Bipolar disorder in remission (HCC)   . Back pain 08/25/2016  . Generalized anxiety disorder 08/23/2015  . Osteoarthritis of right knee 12/21/2014  . Dysuria 04/22/2014  . Vaginal discharge 04/22/2014  . Tinea pedis of both feet 10/02/2013  . Menorrhagia 09/04/2012  . Allergic rhinitis 06/12/2012  . Eczema 03/26/2012  . Alcohol abuse 10/26/2010  . Bipolar 1 disorder (HCC) 10/09/2010  . Panic attacks 10/09/2010  . INSOMNIA, CHRONIC 06/29/2009  . TOBACCO USER 01/23/2009  . Obesity 10/29/2008  . Essential hypertension 12/20/2006    Past Surgical History:  Procedure Laterality Date  . BIOPSY  09/19/2017   Procedure: BIOPSY;  Surgeon: Kathi Der, MD;  Location: [facility];  Service: Gastroenterology;;  . [name] & CURRETTAGE/HYSTROSCOPY WITH HYDROTHERMAL ABLATION N/A 04/04/2017   Procedure: DILATATION & CURETTAGE/HYSTEROSCOPY WITH HYDROTHERMAL ABLATION;  Surgeon: Reva Bores, MD;  Location: [facility];  Service: Gynecology;  Laterality: N/A;  . ESOPHAGOGASTRODUODENOSCOPY (EGD) WITH PROPOFOL N/A 09/19/2017   Procedure: ESOPHAGOGASTRODUODENOSCOPY (EGD) WITH PROPOFOL;  Surgeon: Kathi Der, MD;  Location: [facility];  Service: Gastroenterology;  Laterality: N/A;  . TUBAL LIGATION       OB History    Gravida  4   Para  3   Term  3   Preterm  AB  1   Living  3     SAB      TAB  1   Ectopic      Multiple      Live Births              Family History  Problem Relation Age of Onset  . Cancer Maternal Grandmother        Breast  . Hypertension Mother   . Hypertension Brother   . Cancer Maternal Uncle        colon  . Bipolar disorder Cousin   . Schizophrenia Cousin     Social History   Tobacco Use  . Smoking status: Former Smoker    Packs/day: 0.25    Years: 5.00    Pack years: 1.25    Types: Cigarettes  . Smokeless tobacco: Never  Used  Vaping Use  . Vaping Use: Never used  Substance Use Topics  . Alcohol use: Yes    Comment: occassionally   . Drug use: No    Home Medications Prior to Admission medications   Medication Sig Start Date End Date Taking? Authorizing Provider  acetaminophen (TYLENOL) 500 MG tablet Take 1 tablet (500 mg total) by mouth every 8 (eight) hours as needed for mild pain. 07/04/19  Yes Bland, Scott, DO  famotidine (PEPCID) 20 MG tablet TAKE 1 TABLET(20 MG) BY MOUTH TWICE DAILY Patient taking differently: Take 20 mg by mouth 2 (two) times daily.  08/25/19  Yes Bland, Scott, DO  gabapentin (NEURONTIN) 300 MG capsule Take 1 capsule (300 mg total) by mouth 3 (three) times daily. 06/25/19  Yes Hensel, Santiago Bumpers, MD  hydrOXYzine (VISTARIL) 25 MG capsule Take 1 capsule (25 mg total) by mouth daily as needed for anxiety. 07/04/19  Yes Bland, Scott, DO  ibuprofen (ADVIL) 600 MG tablet TAKE 1 TABLET(600 MG) BY MOUTH TWICE DAILY AS NEEDED Patient taking differently: Take 600 mg by mouth 2 (two) times daily as needed for moderate pain.  08/25/19  Yes Bland, Scott, DO  lisinopril-hydrochlorothiazide (ZESTORETIC) 10-12.5 MG tablet Take 1 tablet by mouth daily. 07/04/19  Yes Bland, Scott, DO  OLANZapine zydis (ZYPREXA ZYDIS) 5 MG disintegrating tablet Take 1 tablet (5 mg total) by mouth at bedtime. 07/04/19  Yes Marthenia Rolling, DO  sulfamethoxazole-trimethoprim (BACTRIM) 400-80 MG tablet Take 2 tablets by mouth 2 (two) times daily for 14 days. 08/26/19 09/09/19 Yes Garnette Gunner, MD  tiZANidine (ZANAFLEX) 4 MG tablet Take 1-2 tablets (4-8 mg total) by mouth every 6 (six) hours as needed for muscle spasms. 07/04/19  Yes Marthenia Rolling, DO  triamcinolone ointment (KENALOG) 0.1 % Apply 1 application topically 2 (two) times daily. 06/25/19  Yes Hensel, Santiago Bumpers, MD  fluticasone (FLONASE) 50 MCG/ACT nasal spray Place 2 sprays into both nostrils daily. Patient taking differently: Place 2 sprays into both nostrils daily as  needed for allergies.  02/25/17 04/14/19  Belinda Fisher, PA-C  promethazine (PHENERGAN) 25 MG tablet Take 1 tablet (25 mg total) by mouth every 6 (six) hours as needed for nausea or vomiting. 04/17/18 08/29/18  Jacalyn Lefevre, MD    Allergies    Sumatriptan  Review of Systems   Review of Systems  Constitutional: Negative for fever.  HENT: Positive for sore throat. Negative for rhinorrhea.   Eyes: Negative for redness.  Respiratory: Negative for cough and shortness of breath.   Cardiovascular: Negative for chest pain.  Gastrointestinal: Positive for abdominal pain, nausea and vomiting. Negative for blood in  stool, constipation and diarrhea.  Genitourinary: Positive for dysuria and frequency.  Musculoskeletal: Negative for myalgias.  Skin: Negative for rash.  Neurological: Negative for headaches.    Physical Exam Updated Vital Signs BP (!) 135/94 (BP Location: Right Arm)   Pulse 94   Temp 98.4 F (36.9 C) (Oral)   Resp 15   SpO2 100%   Physical Exam Vitals and nursing note reviewed.  Constitutional:      Appearance: She is well-developed.  HENT:     Head: Normocephalic and atraumatic.  Eyes:     General:        Right eye: No discharge.        Left eye: No discharge.     Conjunctiva/sclera: Conjunctivae normal.  Cardiovascular:     Rate and Rhythm: Regular rhythm. Tachycardia present.     Heart sounds: Normal heart sounds.     Comments: 95-110bpm during exam Pulmonary:     Effort: Pulmonary effort is normal.     Breath sounds: Normal breath sounds.  Abdominal:     Palpations: Abdomen is soft.     Tenderness: There is abdominal tenderness. There is no guarding or rebound.     Comments: Mild epigastric tenderness  Musculoskeletal:     Cervical back: Normal range of motion and neck supple.  Skin:    General: Skin is warm and dry.  Neurological:     Mental Status: She is alert.     ED Results / Procedures / Treatments   Labs (all labs ordered are listed, but only  abnormal results are displayed) Labs Reviewed  COMPREHENSIVE METABOLIC PANEL - Abnormal; Notable for the following components:      Result Value   Potassium 2.8 (*)    Glucose, Bld 111 (*)    AST 66 (*)    Total Bilirubin 2.3 (*)    Anion gap 16 (*)    All other components within normal limits  CBC - Abnormal; Notable for the following components:   RBC 3.82 (*)    HCT 35.0 (*)    All other components within normal limits  URINALYSIS, ROUTINE W REFLEX MICROSCOPIC - Abnormal; Notable for the following components:   Color, Urine AMBER (*)    APPearance HAZY (*)    Hgb urine dipstick SMALL (*)    Bilirubin Urine SMALL (*)    Protein, ur >=300 (*)    Leukocytes,Ua LARGE (*)    WBC, UA >50 (*)    Bacteria, UA FEW (*)    Non Squamous Epithelial 0-5 (*)    All other components within normal limits  MAGNESIUM - Abnormal; Notable for the following components:   Magnesium 1.2 (*)    All other components within normal limits  URINE CULTURE  LIPASE, BLOOD  I-STAT BETA HCG BLOOD, ED ([facility], [facility], [facility] ONLY)    ED ECG REPORT   Date: 09/03/2019  Rate: 108  Rhythm: sinus tachycardia, PAC  QRS Axis: normal  Intervals: normal  ST/T Wave abnormalities: nonspecific T wave changes  Conduction Disutrbances:none  Narrative Interpretation:   Old EKG Reviewed: changes noted  I have personally reviewed the EKG tracing and agree with the computerized printout as noted.   Radiology No results found.  Procedures Procedures (including critical care time)  Medications Ordered in ED Medications  sodium chloride flush (NS) 0.9 % injection 3 mL (has no administration in time range)  sodium chloride 0.9 % bolus 1,000 mL (has no administration in time range)  potassium chloride 10 mEq  in 100 mL IVPB (has no administration in time range)  ondansetron (ZOFRAN) injection 4 mg (has no administration in time range)  pantoprazole (PROTONIX) injection 40 mg (has no administration in time range)    ED  Course  I have reviewed the triage vital signs and the nursing notes.  Pertinent labs & imaging results that were available during my care of the patient were reviewed by me and considered in my medical decision making (see chart for details).  Patient seen and examined.  Reviewed hospitalization notes from 2019 as well as EGD results.  Patient history and exam is suspicious for recurrence of esophagitis/gastritis.  She has no rebound or guarding on exam and appears comfortable.  She would benefit from IV fluids given her mild tachycardia.  She will be given potassium repletion.  Will check magnesium.  Will give dose of IV Protonix.  Patient requesting ice chips.  Vital signs reviewed and are as follows: BP (!) 135/94 (BP Location: Right Arm)   Pulse 94   Temp 98.4 F (36.9 C) (Oral)   Resp 15   SpO2 100%   Mg was low, repletion ordered.   UA continues to show UTI -- given not tolerating antibiotics at home, dose of Rocephin ordered.  Patient was given 2 L normal saline IV.  Repeat EKG reviewed.  Patient has had similar inferior T wave inversions in the past.  Patient has tolerated oral fluids without vomiting.  Overall she appears well.  Plan as follows:  Concern for esophagitis/gastritis: Patient to continue Pepcid, start omeprazole, start Carafate.  Encouraged continuation of liquids to ensure hydration.  Low potassium/magnesium: Will need follow-up with PCP in the next several days for recheck.  Home with 10 days of supplementation.  I sent note to PCP requesting close follow-up.  UTI: Untreated.  No signs of pyelonephritis.  Culture sent.  Patient given dose of Rocephin here.  She was prescribed Bactrim.  Hopefully she can tolerate this with symptom control.  Will need PCP follow-up to ensure improvement/resolution.  The patient was urged to return to the Emergency Department immediately with worsening of current symptoms, worsening abdominal pain, persistent vomiting, blood noted  in stools, fever, or any other concerns. The patient verbalized understanding.      MDM Rules/Calculators/A&P                          Patient with abdominal pain, epigastric.  Here today mainly because of persistent nausea and vomiting.  Symptoms are controlled and patient was hydrated as above.  Lab abnormalities addressed as above.  Patient without rebound or guarding on exam.  She is only tender in the epigastrium.  Do not feel that further imaging is necessary at this point.  Vitals are stable.  No signs of significant dehydration, patient is tolerating PO's. Lungs are clear and no signs suggestive of PNA. Low concern for appendicitis, cholecystitis, pancreatitis, ruptured viscus, UTI, kidney stone, aortic dissection, aortic aneurysm or other emergent abdominal etiology. Supportive therapy indicated with return if symptoms worsen.   Final Clinical Impression(s) / ED Diagnoses Final diagnoses:  Non-intractable vomiting with nausea, unspecified vomiting type  Hypokalemia  Hypomagnesemia  Acute cystitis without hematuria    Rx / DC Orders ED Discharge Orders         Ordered    omeprazole (PRILOSEC) 20 MG capsule     Discontinue  Reprint     09/03/19 1318    sucralfate (CARAFATE) 1 GM/10ML  suspension  3 times daily with meals & bedtime     Discontinue  Reprint     09/03/19 1318    ondansetron (ZOFRAN ODT) 4 MG disintegrating tablet  Every 8 hours PRN     Discontinue  Reprint     09/03/19 1318    potassium chloride SA (KLOR-CON) 20 MEQ tablet  2 times daily     Discontinue  Reprint     09/03/19 1318    magnesium oxide (MAG-OX) 400 MG tablet  Daily     Discontinue  Reprint     09/03/19 1318           Carlisle Cater, PA-C 09/03/19 1340    Little, Wenda Overland, MD 09/03/19 1356

## 2019-09-04 ENCOUNTER — Ambulatory Visit: Payer: 59 | Admitting: Family Medicine

## 2019-09-04 LAB — URINE CULTURE

## 2019-09-05 ENCOUNTER — Telehealth: Payer: Self-pay | Admitting: *Deleted

## 2019-09-05 NOTE — Telephone Encounter (Signed)
LVM to call office to see about getting pt in for an appointment per Dr. Parke Simmers.  Her new PCP will be Dr. Nobie Putnam.  Please assist her in getting this scheduled.Theodore Rahrig Zimmerman Rumple, CMA

## 2019-09-05 NOTE — Telephone Encounter (Signed)
-----   Message from Marthenia Rolling, DO sent at 09/05/2019 11:49 AM EDT ----- Regarding: FW: Patient follow-up Please schedule patient for f/u in the next week if possible with someone "bmp/recurring UTIs/vomiting" ----- Message ----- From: Garnette Gunner, MD Sent: 09/05/2019   9:28 AM EDT To: Marthenia Rolling, DO Subject: FW: Patient follow-up                          FYI ----- Message ----- From: Renne Crigler, PA-C Sent: 09/03/2019   1:23 PM EDT To: Garnette Gunner, MD Subject: Patient follow-up                              Dr. Janee Morn --   Ms. Wordell has seen multiple providers at [facility], you most recently, so I wanted to update you about her ED visit today.    She was seen for nausea/vomiting and epigastric pain over the past several weeks.  She has a history of esophagitis/gastritis and I am concerned that this has returned.  She was given IV fluids, protonix and zofran with improvement.  I have restarted her on omeprazole and Carafate for this.  She was found to have low potassium and low magnesium.  These will need rechecked as an outpatient.  We spent time giving repletion in the ED.  I have given her 10 days of oral supplements as well.  As you know, she continues to undergo treatment for UTI.  She tried to start her latest prescription, Bactrim, yesterday but vomited.  I sent a urine culture today and give her a dose of IV Rocephin.  Her presentation is not suggestive of pyelo currently.   I do not think that she needs admission at this time, but will require close follow-up with the clinic.  Thanks a lot,  [employer]

## 2019-09-07 ENCOUNTER — Other Ambulatory Visit: Payer: Self-pay | Admitting: Family Medicine

## 2019-09-07 DIAGNOSIS — K209 Esophagitis, unspecified without bleeding: Secondary | ICD-10-CM

## 2019-09-08 ENCOUNTER — Other Ambulatory Visit: Payer: Self-pay

## 2019-09-08 ENCOUNTER — Ambulatory Visit
Admission: RE | Admit: 2019-09-08 | Discharge: 2019-09-08 | Disposition: A | Discharge status: Home / Self Care | Payer: 59 | Source: Ambulatory Visit | Attending: Orthopedic Surgery | Admitting: Orthopedic Surgery

## 2019-09-08 DIAGNOSIS — M25561 Pain in right knee: Secondary | ICD-10-CM

## 2019-09-22 ENCOUNTER — Encounter (HOSPITAL_BASED_OUTPATIENT_CLINIC_OR_DEPARTMENT_OTHER): Payer: Self-pay | Admitting: Orthopedic Surgery

## 2019-09-22 ENCOUNTER — Other Ambulatory Visit: Payer: Self-pay

## 2019-09-22 NOTE — Progress Notes (Addendum)
Spoke w/ via phone for pre-op interview---pt Lab needs dos---- urine poct             Lab results------ekg 09-08-2019 epic COVID test ------09-27-2019 at 1225 pm Arrive at -------600 am 09-30-2019 No food after mdinight clear liquids from midnight until 500 am then npo Medications to take morning of surgery -----famotidine, gabapentin, omeprazole, hydroxyazine prn Diabetic medication -----n/a Patient Special Instructions -----none Pre-Op special Istructions -----none Patient verbalized understanding of instructions that were given at this phone interview. Patient denies shortness of breath, chest pain, fever, cough a this phone interview.

## 2019-09-22 NOTE — Progress Notes (Signed)
NEW Covid Policy July 2021  Surgery Day:  09-30-19  Facility:  [facility]  Type of Surgery: right knee arthroscopy  Fully Covid Vaccinated:   no Not Covid Vaccinated:  covid  Do you have symptoms? no  In the past 14 days:        Have you had any symptoms? no       Have you been tested covid positive? no       Have you been in contact with someone covid positive? no        Is pt Immuno-compromised? no

## 2019-09-23 ENCOUNTER — Encounter: Payer: Self-pay | Admitting: Family Medicine

## 2019-09-23 ENCOUNTER — Other Ambulatory Visit: Payer: Self-pay

## 2019-09-23 ENCOUNTER — Ambulatory Visit (INDEPENDENT_AMBULATORY_CARE_PROVIDER_SITE_OTHER): Payer: 59 | Admitting: Family Medicine

## 2019-09-23 VITALS — BP 132/64 | HR 93 | Wt 171.2 lb

## 2019-09-23 DIAGNOSIS — F319 Bipolar disorder, unspecified: Secondary | ICD-10-CM | POA: Diagnosis not present

## 2019-09-23 DIAGNOSIS — N898 Other specified noninflammatory disorders of vagina: Secondary | ICD-10-CM | POA: Diagnosis not present

## 2019-09-23 DIAGNOSIS — K209 Esophagitis, unspecified without bleeding: Secondary | ICD-10-CM

## 2019-09-23 DIAGNOSIS — R300 Dysuria: Secondary | ICD-10-CM

## 2019-09-23 LAB — POCT URINALYSIS DIP (MANUAL ENTRY)
Blood, UA: NEGATIVE
Glucose, UA: NEGATIVE mg/dL
Leukocytes, UA: NEGATIVE
Nitrite, UA: NEGATIVE
Protein Ur, POC: 30 mg/dL — AB
Spec Grav, UA: 1.02 (ref 1.010–1.025)
Urobilinogen, UA: 2 E.U./dL — AB
pH, UA: 5.5 (ref 5.0–8.0)

## 2019-09-23 LAB — POCT WET PREP (WET MOUNT): Clue Cells Wet Prep Whiff POC: POSITIVE

## 2019-09-23 MED ORDER — METRONIDAZOLE 500 MG PO TABS
500.0000 mg | ORAL_TABLET | Freq: Two times a day (BID) | ORAL | 0 refills | Status: DC
Start: 2019-09-23 — End: 2019-10-15

## 2019-09-23 MED ORDER — ONDANSETRON 4 MG PO TBDP: 4.0000 mg | ORAL_TABLET | Freq: Three times a day (TID) | ORAL | 0 refills | Status: DC | PRN

## 2019-09-23 MED ORDER — OLANZAPINE 5 MG PO TABS: 5.0000 mg | ORAL_TABLET | Freq: Every day | ORAL | 1 refills | Status: DC

## 2019-09-23 MED ORDER — OMEPRAZOLE 40 MG PO CPDR: 40.0000 mg | DELAYED_RELEASE_CAPSULE | Freq: Every day | ORAL | 3 refills | Status: DC

## 2019-09-23 MED ORDER — FAMOTIDINE 20 MG PO TABS: ORAL_TABLET | ORAL | 0 refills | Status: DC

## 2019-09-23 NOTE — Progress Notes (Signed)
    SUBJECTIVE:   CHIEF COMPLAINT / HPI:  Reflux Patient reports long history of reflux which she has been taking medication for for approximately 2 years.  She says that 2 years ago she had a severe episode of reflux and she was hospitalized and had an upper esophageal scope which showed esophagitis.  She was started on Pepcid as well as omeprazole 40 mg daily which was then decreased to 20 mg daily and she eventually stopped.  When she went to the hospital 2 weeks ago she went because she was having such terrible reflux and difficulty keeping liquids down because it would reflux every time she would eat or drink something.  She was started on omeprazole 40 mg daily for 1 week which she should then take down to 20 mg daily.  She says that the 40 mg daily helped but when she went down to 20 mg to her reflux started to come back.  She has continuously taken Pepcid twice a day.  Denies any blood in her stool, dark tarry stools, blood in any vomit or spit up.  Is requesting that we go back up on her omeprazole.  Would also like a referral to gastroenterology.  Dysuria, malodor, vaginal discharge Patient reports that she was diagnosed with trichomonas before she went to the hospital.  She says that she was given medications for this but was unable to keep it down.  Reports that she is still having pain with urination as well as vaginal discharge with the bad odor.  Bipolar 1 Patient reports history of bipolar 2 for which she takes Zyprexa for she says that she is in need of a refill on her medications.  Reports that it is well controlled on the medications.   OBJECTIVE:   BP 132/64   Pulse 93   Wt 171 lb 3.2 oz (77.7 kg)   SpO2 100%   BMI 29.39 kg/m   General: Well-appearing, no acute distress, sitting in chair next to exam table Respiratory: Regular rate, normal work of breathing, lungs clear to auscultation bilaterally Cardiac: Regular rate and rhythm no murmurs appreciated Abdomen: Soft,  nontender, positive bowel sounds GU: Normal external female genitalia, no erythema or irritation in vaginal tissue, moderate amount of vaginal discharge, no odor noted on exam Extremities: No edema noted Psych: Alert and oriented, adequate responsive to questions, nonirritated  ASSESSMENT/PLAN:   Esophagitis determined by endoscopy Patient reports continuation of esophagitis with reflux.  She is been taking her Pepcid twice daily and was on omeprazole 40 mg for 1 week which she said helped.  When it decreased to 20 mg she says her symptoms are returning. -Continue omeprazole 40 mg daily -Continue Pepcid twice daily -GI referral placed   Vaginal discharge Patient with continued vaginal discharge and odor along with dysuria.  Patient reports she was unable to keep her treatment down for the trichomonas she was diagnosed with previously.  Wet prep completed today showing clue cells as well as trichomonas. -Metronidazole 500 mg twice daily for 7 days -Zofran for nausea  Bipolar 1 disorder Patient reports that her bipolar is well controlled with her Zyprexa.  She is in need of refill for her medications. -Refill sent to her pharmacy -Continue to follow with psychiatry      Derrel Nip, MD [facility]

## 2019-09-23 NOTE — Patient Instructions (Addendum)
It was very nice to meet you today.  Regarding your issues with reflux I would like for you to continue taking the omeprazole 40 mg daily.  I have sent a new prescription to your pharmacy for this.  The new pills are 40 mg to take 1 daily.  Regarding your issues with vaginal discharge, odor pain with urination we have collected a urine sample as well as a fluid sample to look for infection.  When these results come back I will send an antibiotic in for you.  I also sent a refill for your Zyprexa and your Pepcid.  If you have any questions or concerns please feel free to call our clinic.  I hope you have a wonderful afternoon!

## 2019-09-25 NOTE — Assessment & Plan Note (Signed)
Patient with continued vaginal discharge and odor along with dysuria.  Patient reports she was unable to keep her treatment down for the trichomonas she was diagnosed with previously.  Wet prep completed today showing clue cells as well as trichomonas. -Metronidazole 500 mg twice daily for 7 days -Zofran for nausea

## 2019-09-25 NOTE — Assessment & Plan Note (Signed)
Patient reports continuation of esophagitis with reflux.  She is been taking her Pepcid twice daily and was on omeprazole 40 mg for 1 week which she said helped.  When it decreased to 20 mg she says her symptoms are returning. -Continue omeprazole 40 mg daily -Continue Pepcid twice daily -GI referral placed

## 2019-09-25 NOTE — Assessment & Plan Note (Addendum)
Patient reports that her bipolar is well controlled with her Zyprexa.  She is in need of refill for her medications. -Refill sent to her pharmacy -Continue to follow with psychiatry

## 2019-09-27 ENCOUNTER — Other Ambulatory Visit (HOSPITAL_COMMUNITY)
Admission: RE | Admit: 2019-09-27 | Discharge: 2019-09-27 | Disposition: A | Discharge status: Home / Self Care | Payer: 59 | Source: Ambulatory Visit | Attending: Orthopedic Surgery | Admitting: Orthopedic Surgery

## 2019-09-27 DIAGNOSIS — Z20822 Contact with and (suspected) exposure to covid-19: Secondary | ICD-10-CM | POA: Insufficient documentation

## 2019-09-27 DIAGNOSIS — Z01812 Encounter for preprocedural laboratory examination: Secondary | ICD-10-CM | POA: Insufficient documentation

## 2019-09-27 LAB — SARS CORONAVIRUS 2 (TAT 6-24 HRS): SARS Coronavirus 2: NEGATIVE

## 2019-09-28 ENCOUNTER — Other Ambulatory Visit: Payer: Self-pay | Admitting: Family Medicine

## 2019-09-28 DIAGNOSIS — M25562 Pain in left knee: Secondary | ICD-10-CM

## 2019-09-28 DIAGNOSIS — K209 Esophagitis, unspecified without bleeding: Secondary | ICD-10-CM

## 2019-09-28 DIAGNOSIS — M25561 Pain in right knee: Secondary | ICD-10-CM

## 2019-09-29 NOTE — Anesthesia Preprocedure Evaluation (Addendum)
Anesthesia Evaluation  Patient identified by MRN, date of birth, ID band Patient awake    Reviewed: Allergy & Precautions, NPO status , Patient's Chart, lab work & pertinent test results  Airway Mallampati: II  TM Distance: >3 FB Neck ROM: Full    Dental no notable dental hx. (+) Teeth Intact, Dental Advisory Given   Pulmonary Current Smoker and Patient abstained from smoking.,    Pulmonary exam normal breath sounds clear to auscultation       Cardiovascular hypertension, Pt. on medications Normal cardiovascular exam Rhythm:Regular Rate:Normal     Neuro/Psych  Headaches, Anxiety    GI/Hepatic Neg liver ROS, GERD  ,  Endo/Other  negative endocrine ROS  Renal/GU      Musculoskeletal  (+) Arthritis ,   Abdominal   Peds  Hematology negative hematology ROS (+)   Anesthesia Other Findings   Reproductive/Obstetrics                           Anesthesia Physical Anesthesia Plan  ASA: III  Anesthesia Plan: General   Post-op Pain Management:    Induction: Intravenous  PONV Risk Score and Plan: Treatment may vary due to age or medical condition, Dexamethasone, Midazolam and Ondansetron  Airway Management Planned: LMA  Additional Equipment: None  Intra-op Plan:   Post-operative Plan:   Informed Consent: I have reviewed the patients History and Physical, chart, labs and discussed the procedure including the risks, benefits and alternatives for the proposed anesthesia with the patient or authorized representative who has indicated his/her understanding and acceptance.     Dental advisory given  Plan Discussed with: CRNA and Surgeon  Anesthesia Plan Comments:        Anesthesia Quick Evaluation

## 2019-09-29 NOTE — H&P (Signed)
PREOPERATIVE H&P  Chief Complaint: RIGHT KNEE LATERAL MENISCUS TEAR  HPI: Kristin [name] is a 44 y.o. female who is scheduled for KNEE ARTHROSCOPY WITH LATERAL MENISECTOMY.   Patient has a past medical history significant for HTN, acid reflux, headaches, anxiety/depression.   Patient has had worsening right sided knee pain for the past few months. She has tried injections, anti-inflammatories, and icing her knee with mild improvement. Injection only helped for a little while. She has pain mostly on the lateral aspect of her right knee. 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   Symptoms are rated as moderate to severe, and have been worsening.  This is significantly impairing activities of daily living.    Please see clinic note for further details on this patient's care.    She has elected for surgical management.   Past Medical History:  Diagnosis Date   Acid reflux    ACL tear 2011   not sure which side   Acute lateral meniscus tear of right knee    Anxiety    Arthritis    Back pain    slipped disc lower back lumbar   Depression    Eczema    Hypertension    Migraine headache    Past Surgical History:  Procedure Laterality Date   BIOPSY  09/19/2017   Procedure: BIOPSY;  Surgeon: Kathi Der, MD;  Location: [facility];  Service: Gastroenterology;;   DILITATION & CURRETTAGE/HYSTROSCOPY WITH HYDROTHERMAL ABLATION N/A 04/04/2017   Procedure: DILATATION & CURETTAGE/HYSTEROSCOPY WITH HYDROTHERMAL ABLATION;  Surgeon: Reva Bores, MD;  Location: [facility];  Service: Gynecology;  Laterality: N/A;   ESOPHAGOGASTRODUODENOSCOPY (EGD) WITH PROPOFOL N/A 09/19/2017   Procedure: ESOPHAGOGASTRODUODENOSCOPY (EGD) WITH PROPOFOL;  Surgeon: Kathi Der, MD;  Location: [facility];  Service: Gastroenterology;  Laterality: N/A;   TUBAL LIGATION  yrs ago   Social History   Socioeconomic History   Marital status: Single    Spouse name: Not  on file   Number of children: 3   Years of education: Not on file   Highest education level: High school graduate  Occupational History   Not on file  Tobacco Use   Smoking status: Current Some Day Smoker    Packs/day: 0.25    Years: 5.00    Pack years: 1.25    Types: Cigarettes   Smokeless tobacco: Never Used  Vaping Use   Vaping Use: Never used  Substance and Sexual Activity   Alcohol use: Yes    Comment: occassionally    Drug use: No   Sexual activity: Yes    Birth control/protection: Surgical  Other Topics Concern   Not on file  Social History Narrative   Not on file   Social Determinants of Health   Financial Resource Strain:    Difficulty of Paying Living Expenses:   Food Insecurity:    Worried About Programme researcher, broadcasting/film/video in the Last Year:    Barista in the Last Year:   Transportation Needs:    Freight forwarder (Medical):    Lack of Transportation (Non-Medical):   Physical Activity:    Days of Exercise per Week:    Minutes of Exercise per Session:   Stress:    Feeling of Stress :   Social Connections:    Frequency of Communication with Friends and Family:    Frequency of Social Gatherings with Friends and Family:    Attends Religious Services:  Active Member of Clubs or Organizations:    Attends Engineer, structural:    Marital Status:    Family History  Problem Relation Age of Onset   Cancer Maternal Grandmother        Breast   Hypertension Mother    Hypertension Brother    Cancer Maternal Uncle        colon   Bipolar disorder Cousin    Schizophrenia Cousin    Allergies  Allergen Reactions   Sumatriptan Anaphylaxis    High blood pressure and numb   Prior to Admission medications   Medication Sig Start Date End Date Taking? Authorizing Provider  acetaminophen (TYLENOL) 500 MG tablet Take 1 tablet (500 mg total) by mouth every 8 (eight) hours as needed for mild pain. 07/04/19   Marthenia Rolling,  DO  famotidine (PEPCID) 20 MG tablet TAKE 1 TABLET(20 MG) BY MOUTH TWICE DAILY 09/29/19   Derrel Nip, MD  gabapentin (NEURONTIN) 300 MG capsule Take 1 capsule (300 mg total) by mouth 3 (three) times daily. 06/25/19   Moses Manners, MD  hydrOXYzine (VISTARIL) 25 MG capsule Take 1 capsule (25 mg total) by mouth daily as needed for anxiety. 07/04/19   Marthenia Rolling, DO  ibuprofen (ADVIL) 600 MG tablet TAKE 1 TABLET(600 MG) BY MOUTH TWICE DAILY AS NEEDED 09/29/19   Derrel Nip, MD  lisinopril-hydrochlorothiazide (ZESTORETIC) 10-12.5 MG tablet Take 1 tablet by mouth daily. 07/04/19   Marthenia Rolling, DO  magnesium oxide (MAG-OX) 400 MG tablet Take 1 tablet (400 mg total) by mouth daily. 09/03/19   Renne Crigler, PA-C  metroNIDAZOLE (FLAGYL) 500 MG tablet Take 1 tablet (500 mg total) by mouth 2 (two) times daily. 09/23/19   Derrel Nip, MD  OLANZapine (ZYPREXA) 5 MG tablet Take 1 tablet (5 mg total) by mouth at bedtime. 09/23/19   Derrel Nip, MD  omeprazole (PRILOSEC) 40 MG capsule Take 1 capsule (40 mg total) by mouth daily. 09/23/19   Derrel Nip, MD  ondansetron (ZOFRAN ODT) 4 MG disintegrating tablet Take 1 tablet (4 mg total) by mouth every 8 (eight) hours as needed for nausea or vomiting. 09/23/19   Derrel Nip, MD  potassium chloride SA (KLOR-CON) 20 MEQ tablet Take 1 tablet (20 mEq total) by mouth 2 (two) times daily. 09/03/19   Renne Crigler, PA-C  tiZANidine (ZANAFLEX) 4 MG tablet Take 1-2 tablets (4-8 mg total) by mouth every 6 (six) hours as needed for muscle spasms. 07/04/19   Marthenia Rolling, DO  triamcinolone ointment (KENALOG) 0.1 % Apply 1 application topically 2 (two) times daily. 06/25/19   Moses Manners, MD  fluticasone (FLONASE) 50 MCG/ACT nasal spray Place 2 sprays into both nostrils daily. Patient taking differently: Place 2 sprays into both nostrils daily as needed for allergies.  02/25/17 04/14/19  Belinda Fisher, PA-C  promethazine (PHENERGAN) 25 MG tablet Take 1  tablet (25 mg total) by mouth every 6 (six) hours as needed for nausea or vomiting. 04/17/18 08/29/18  Jacalyn Lefevre, MD    ROS: All other systems have been reviewed and were otherwise negative with the exception of those mentioned in the HPI and as above.  Physical Exam: General: Alert, no acute distress Cardiovascular: No pedal edema Respiratory: No cyanosis, no use of accessory musculature GI: No organomegaly, abdomen is soft and non-tender Skin: No lesions in the area of chief complaint Neurologic: Sensation intact distally Psychiatric: Patient is competent for consent with normal mood and affect Lymphatic: No axillary or cervical lymphadenopathy  MUSCULOSKELETAL:  Right knee: tenderness to palpation throughout the right knee, worse laterally. Some crepitus noted. Significant guarding. Difficult to obtain ligamentous exam.  Imaging: MRI showed advanced tricompartmental osteoarthritis with a horizontal tear in the lateral meniscus. Fatty replacement of musculature was noted around the knee.   Assessment: RIGHT KNEE LATERAL MENISCUS TEAR  Plan: Plan for Procedure(s): KNEE ARTHROSCOPY WITH LATERAL MENISECTOMY  The risks benefits and alternatives were discussed with the patient including but not limited to the risks of nonoperative treatment, versus surgical intervention including infection, bleeding, nerve injury,  blood clots, cardiopulmonary complications, morbidity, mortality, among others, and they were willing to proceed.   The patient acknowledged the explanation, agreed to proceed with the plan and consent was signed.   Operative Plan: Right knee scope with lateral meniscectomy Discharge Medications: Standard DVT Prophylaxis: Aspirin Physical Therapy: +/- outpatient PT Special Discharge needs: +/- knee immobilizer if she gets a block   Vernetta Honey, PA-C  09/29/2019 5:24 PM

## 2019-09-30 ENCOUNTER — Ambulatory Visit (HOSPITAL_BASED_OUTPATIENT_CLINIC_OR_DEPARTMENT_OTHER): Payer: 59 | Admitting: Anesthesiology

## 2019-09-30 ENCOUNTER — Encounter (HOSPITAL_BASED_OUTPATIENT_CLINIC_OR_DEPARTMENT_OTHER)
Admission: RE | Disposition: A | Discharge status: Home / Self Care | Payer: Self-pay | Source: Home / Self Care | Attending: Orthopedic Surgery

## 2019-09-30 ENCOUNTER — Ambulatory Visit (HOSPITAL_BASED_OUTPATIENT_CLINIC_OR_DEPARTMENT_OTHER)
Admission: RE | Admit: 2019-09-30 | Discharge: 2019-09-30 | Disposition: A | Discharge status: Home / Self Care | Payer: 59 | Attending: Orthopedic Surgery | Admitting: Orthopedic Surgery

## 2019-09-30 ENCOUNTER — Other Ambulatory Visit: Payer: Self-pay

## 2019-09-30 ENCOUNTER — Encounter (HOSPITAL_BASED_OUTPATIENT_CLINIC_OR_DEPARTMENT_OTHER): Payer: Self-pay | Admitting: Orthopedic Surgery

## 2019-09-30 DIAGNOSIS — M2341 Loose body in knee, right knee: Secondary | ICD-10-CM | POA: Insufficient documentation

## 2019-09-30 DIAGNOSIS — F17210 Nicotine dependence, cigarettes, uncomplicated: Secondary | ICD-10-CM | POA: Insufficient documentation

## 2019-09-30 DIAGNOSIS — I10 Essential (primary) hypertension: Secondary | ICD-10-CM | POA: Insufficient documentation

## 2019-09-30 DIAGNOSIS — K219 Gastro-esophageal reflux disease without esophagitis: Secondary | ICD-10-CM | POA: Diagnosis not present

## 2019-09-30 DIAGNOSIS — Z79899 Other long term (current) drug therapy: Secondary | ICD-10-CM | POA: Diagnosis not present

## 2019-09-30 DIAGNOSIS — F419 Anxiety disorder, unspecified: Secondary | ICD-10-CM | POA: Insufficient documentation

## 2019-09-30 DIAGNOSIS — F329 Major depressive disorder, single episode, unspecified: Secondary | ICD-10-CM | POA: Insufficient documentation

## 2019-09-30 DIAGNOSIS — X58XXXA Exposure to other specified factors, initial encounter: Secondary | ICD-10-CM | POA: Diagnosis not present

## 2019-09-30 DIAGNOSIS — S83281A Other tear of lateral meniscus, current injury, right knee, initial encounter: Secondary | ICD-10-CM | POA: Insufficient documentation

## 2019-09-30 HISTORY — DX: Myoneural disorder, unspecified: G70.9

## 2019-09-30 HISTORY — DX: Dorsalgia, unspecified: M54.9

## 2019-09-30 HISTORY — DX: Other tear of lateral meniscus, current injury, right knee, initial encounter: S83.281A

## 2019-09-30 HISTORY — PX: KNEE ARTHROSCOPY WITH LATERAL MENISECTOMY: SHX6193

## 2019-09-30 LAB — POCT PREGNANCY, URINE: Preg Test, Ur: NEGATIVE

## 2019-09-30 SURGERY — ARTHROSCOPY, KNEE, WITH LATERAL MENISCECTOMY
Anesthesia: General | Site: Knee | Laterality: Right

## 2019-09-30 MED ORDER — LIDOCAINE HCL (CARDIAC) PF 100 MG/5ML IV SOSY
PREFILLED_SYRINGE | INTRAVENOUS | Status: DC | PRN
  Administered 2019-09-30: 100 mg via INTRAVENOUS

## 2019-09-30 MED ORDER — HYDROMORPHONE HCL 1 MG/ML IJ SOLN
0.2500 mg | INTRAMUSCULAR | Status: DC | PRN
  Administered 2019-09-30: 0.5 mg via INTRAVENOUS

## 2019-09-30 MED ORDER — SODIUM CHLORIDE 0.9 % IR SOLN
Status: DC | PRN
  Administered 2019-09-30: 3000 mL

## 2019-09-30 MED ORDER — FAMOTIDINE IN NACL 20-0.9 MG/50ML-% IV SOLN
INTRAVENOUS | Status: AC
  Filled 2019-09-30: qty 50

## 2019-09-30 MED ORDER — LACTATED RINGERS IV SOLN: INTRAVENOUS | Status: DC

## 2019-09-30 MED ORDER — ONDANSETRON HCL 4 MG/2ML IJ SOLN: 4.0000 mg | Freq: Once | INTRAMUSCULAR | Status: DC | PRN

## 2019-09-30 MED ORDER — HYDROMORPHONE HCL 1 MG/ML IJ SOLN
INTRAMUSCULAR | Status: AC
  Filled 2019-09-30: qty 1

## 2019-09-30 MED ORDER — CEFAZOLIN SODIUM-DEXTROSE 2-4 GM/100ML-% IV SOLN
INTRAVENOUS | Status: AC
  Filled 2019-09-30: qty 100

## 2019-09-30 MED ORDER — AMISULPRIDE (ANTIEMETIC) 5 MG/2ML IV SOLN
10.0000 mg | Freq: Once | INTRAVENOUS | Status: AC | PRN
  Administered 2019-09-30: 10 mg via INTRAVENOUS

## 2019-09-30 MED ORDER — FENTANYL CITRATE (PF) 100 MCG/2ML IJ SOLN
INTRAMUSCULAR | Status: AC
  Filled 2019-09-30: qty 2

## 2019-09-30 MED ORDER — ONDANSETRON HCL 4 MG PO TABS: 4.0000 mg | ORAL_TABLET | Freq: Three times a day (TID) | ORAL | 0 refills | Status: DC | PRN

## 2019-09-30 MED ORDER — FENTANYL CITRATE (PF) 100 MCG/2ML IJ SOLN
INTRAMUSCULAR | Status: DC | PRN
  Administered 2019-09-30 (×2): 50 ug via INTRAVENOUS
  Administered 2019-09-30: 25 ug via INTRAVENOUS
  Administered 2019-09-30: 50 ug via INTRAVENOUS
  Administered 2019-09-30: 25 ug via INTRAVENOUS

## 2019-09-30 MED ORDER — MIDAZOLAM HCL 2 MG/2ML IJ SOLN
INTRAMUSCULAR | Status: AC
  Filled 2019-09-30: qty 2

## 2019-09-30 MED ORDER — FAMOTIDINE 40 MG/5ML PO SUSR
20.0000 mg | Freq: Once | ORAL | Status: DC
  Filled 2019-09-30: qty 2.5

## 2019-09-30 MED ORDER — FAMOTIDINE IN NACL 20-0.9 MG/50ML-% IV SOLN
20.0000 mg | Freq: Once | INTRAVENOUS | Status: AC
  Administered 2019-09-30: 20 mg via INTRAVENOUS

## 2019-09-30 MED ORDER — HYDROCODONE-ACETAMINOPHEN 5-325 MG PO TABS: 1.0000 | ORAL_TABLET | Freq: Four times a day (QID) | ORAL | 0 refills | Status: DC | PRN

## 2019-09-30 MED ORDER — MIDAZOLAM HCL 5 MG/5ML IJ SOLN
INTRAMUSCULAR | Status: DC | PRN
  Administered 2019-09-30: 2 mg via INTRAVENOUS

## 2019-09-30 MED ORDER — ASPIRIN EC 81 MG PO TBEC: 81.0000 mg | DELAYED_RELEASE_TABLET | Freq: Two times a day (BID) | ORAL | 0 refills | Status: DC

## 2019-09-30 MED ORDER — ONDANSETRON HCL 4 MG/2ML IJ SOLN
INTRAMUSCULAR | Status: DC | PRN
  Administered 2019-09-30: 4 mg via INTRAVENOUS

## 2019-09-30 MED ORDER — PROPOFOL 10 MG/ML IV BOLUS
INTRAVENOUS | Status: DC | PRN
  Administered 2019-09-30: 200 mg via INTRAVENOUS

## 2019-09-30 MED ORDER — DEXAMETHASONE SODIUM PHOSPHATE 4 MG/ML IJ SOLN
INTRAMUSCULAR | Status: DC | PRN
  Administered 2019-09-30: 5 mg via INTRAVENOUS

## 2019-09-30 MED ORDER — DEXAMETHASONE SODIUM PHOSPHATE 10 MG/ML IJ SOLN
INTRAMUSCULAR | Status: AC
  Filled 2019-09-30: qty 1

## 2019-09-30 MED ORDER — SCOPOLAMINE 1 MG/3DAYS TD PT72
MEDICATED_PATCH | TRANSDERMAL | Status: AC
  Filled 2019-09-30: qty 1

## 2019-09-30 MED ORDER — ACETAMINOPHEN 10 MG/ML IV SOLN
1000.0000 mg | Freq: Once | INTRAVENOUS | Status: DC | PRN
  Administered 2019-09-30: 1000 mg via INTRAVENOUS

## 2019-09-30 MED ORDER — OXYCODONE HCL 5 MG/5ML PO SOLN: 5.0000 mg | Freq: Once | ORAL | Status: AC | PRN

## 2019-09-30 MED ORDER — METHYLPREDNISOLONE ACETATE 80 MG/ML IJ SUSP
INTRAMUSCULAR | Status: DC | PRN
  Administered 2019-09-30: 1 mL via INTRA_ARTICULAR

## 2019-09-30 MED ORDER — CEFAZOLIN SODIUM-DEXTROSE 2-4 GM/100ML-% IV SOLN
2.0000 g | INTRAVENOUS | Status: AC
  Administered 2019-09-30: 2 g via INTRAVENOUS

## 2019-09-30 MED ORDER — LIDOCAINE 2% (20 MG/ML) 5 ML SYRINGE
INTRAMUSCULAR | Status: AC
  Filled 2019-09-30: qty 5

## 2019-09-30 MED ORDER — OXYCODONE HCL 5 MG PO TABS
5.0000 mg | ORAL_TABLET | Freq: Once | ORAL | Status: AC | PRN
  Administered 2019-09-30: 5 mg via ORAL

## 2019-09-30 MED ORDER — ACETAMINOPHEN 10 MG/ML IV SOLN
INTRAVENOUS | Status: AC
  Filled 2019-09-30: qty 100

## 2019-09-30 MED ORDER — BUPIVACAINE HCL 0.5 % IJ SOLN
INTRAMUSCULAR | Status: DC | PRN
  Administered 2019-09-30: 10 mL

## 2019-09-30 MED ORDER — GLYCOPYRROLATE 0.2 MG/ML IJ SOLN
INTRAMUSCULAR | Status: DC | PRN
  Administered 2019-09-30: .1 mg via INTRAVENOUS

## 2019-09-30 MED ORDER — OXYCODONE HCL 5 MG PO TABS
ORAL_TABLET | ORAL | Status: AC
  Filled 2019-09-30: qty 1

## 2019-09-30 MED ORDER — GLYCOPYRROLATE PF 0.2 MG/ML IJ SOSY
PREFILLED_SYRINGE | INTRAMUSCULAR | Status: AC
  Filled 2019-09-30: qty 1

## 2019-09-30 MED ORDER — PROPOFOL 10 MG/ML IV BOLUS
INTRAVENOUS | Status: AC
  Filled 2019-09-30: qty 20

## 2019-09-30 MED ORDER — SCOPOLAMINE 1 MG/3DAYS TD PT72
1.0000 | MEDICATED_PATCH | TRANSDERMAL | Status: DC
  Administered 2019-09-30: 1.5 mg via TRANSDERMAL

## 2019-09-30 MED ORDER — ONDANSETRON HCL 4 MG/2ML IJ SOLN
INTRAMUSCULAR | Status: AC
  Filled 2019-09-30: qty 2

## 2019-09-30 MED ORDER — AMISULPRIDE (ANTIEMETIC) 5 MG/2ML IV SOLN
INTRAVENOUS | Status: AC
  Filled 2019-09-30: qty 2

## 2019-09-30 SURGICAL SUPPLY — 34 items
APL PRP STRL LF DISP 70% ISPRP (MISCELLANEOUS) ×1
BNDG ELASTIC 6X5.8 VLCR STR LF (GAUZE/BANDAGES/DRESSINGS) ×2 IMPLANT
CHLORAPREP W/TINT 26 (MISCELLANEOUS) ×2 IMPLANT
COVER WAND RF STERILE (DRAPES) ×2 IMPLANT
CUFF TOURN SGL QUICK 34 (TOURNIQUET CUFF) ×2
CUFF TRNQT CYL 34X4.125X (TOURNIQUET CUFF) ×1 IMPLANT
DISSECTOR  3.8MM X 13CM (MISCELLANEOUS) ×2
DISSECTOR 3.8MM X 13CM (MISCELLANEOUS) ×1 IMPLANT
DRAPE ARTHROSCOPY W/POUCH 114 (DRAPES) ×2 IMPLANT
DRAPE IMP U-DRAPE 54X76 (DRAPES) ×2 IMPLANT
DRAPE U-SHAPE 47X51 STRL (DRAPES) ×2 IMPLANT
DRSG EMULSION OIL 3X3 NADH (GAUZE/BANDAGES/DRESSINGS) ×2 IMPLANT
DRSG PAD ABDOMINAL 8X10 ST (GAUZE/BANDAGES/DRESSINGS) ×2 IMPLANT
EXCALIBUR 3.8MM X 13CM (MISCELLANEOUS) IMPLANT
GAUZE SPONGE 4X4 12PLY STRL (GAUZE/BANDAGES/DRESSINGS) ×2 IMPLANT
GAUZE SPONGE 4X4 12PLY STRL LF (GAUZE/BANDAGES/DRESSINGS) ×2 IMPLANT
GAUZE XEROFORM 1X8 LF (GAUZE/BANDAGES/DRESSINGS) ×2 IMPLANT
GLOVE BIO SURGEON STRL SZ7.5 (GLOVE) ×4 IMPLANT
GLOVE BIOGEL PI IND STRL 8 (GLOVE) ×2 IMPLANT
GLOVE BIOGEL PI INDICATOR 8 (GLOVE) ×2
GOWN STRL REUS W/TWL LRG LVL3 (GOWN DISPOSABLE) ×4 IMPLANT
IV NS IRRIG 3000ML ARTHROMATIC (IV SOLUTION) ×4 IMPLANT
MANIFOLD NEPTUNE II (INSTRUMENTS) ×2 IMPLANT
PACK ARTHROSCOPY DSU (CUSTOM PROCEDURE TRAY) ×2 IMPLANT
PACK BASIN DAY SURGERY FS (CUSTOM PROCEDURE TRAY) ×2 IMPLANT
PACK ICE MAXI GEL EZY WRAP (MISCELLANEOUS) ×2 IMPLANT
PAD CAST 4YDX4 CTTN HI CHSV (CAST SUPPLIES) ×1 IMPLANT
PADDING CAST COTTON 4X4 STRL (CAST SUPPLIES) ×2
PORT APPOLLO RF 90DEGREE MULTI (SURGICAL WAND) IMPLANT
SUT ETHILON 3 0 PS 1 (SUTURE) ×2 IMPLANT
TAPE CLOTH 3X10 TAN LF (GAUZE/BANDAGES/DRESSINGS) IMPLANT
TOWEL OR 17X26 10 PK STRL BLUE (TOWEL DISPOSABLE) ×2 IMPLANT
TUBING ARTHROSCOPY IRRIG 16FT (MISCELLANEOUS) ×2 IMPLANT
WATER STERILE IRR 1000ML POUR (IV SOLUTION) ×2 IMPLANT

## 2019-09-30 NOTE — Interval H&P Note (Signed)
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  The various methods of treatment have been discussed with the patient and family. After consideration of risks, benefits and other options for treatment, the patient has consented to  Procedure(s): KNEE ARTHROSCOPY WITH LATERAL MENISECTOMY (Right) as a surgical intervention.  The patient's history has been reviewed, patient examined, no change in status, stable for surgery.  I have reviewed the patient's chart and labs.  Questions were answered to the patient's satisfaction.     Sheral Apley

## 2019-09-30 NOTE — Op Note (Signed)
09/30/2019  9:39 AM  PATIENT:  Kristin [name]    PRE-OPERATIVE DIAGNOSIS:  RIGHT KNEE LATERAL MENISCUS TEAR  POST-OPERATIVE DIAGNOSIS:  Same  PROCEDURE:  KNEE ARTHROSCOPY WITH LATERAL MENISECTOMY  SURGEON:  Sheral Apley, MD  ASSISTANT: caroline Mcbane PAC  ANESTHESIA:   General  BLOOD LOSS: min  COMPLICATIONS: None   PREOPERATIVE INDICATIONS:  Kristin [name] is a  44 y.o. female with a diagnosis of RIGHT KNEE LATERAL MENISCUS TEAR who failed conservative measures and elected for surgical management.    The risks benefits and alternatives were discussed with the patient preoperatively including but not limited to the risks of infection, bleeding, nerve injury, cardiopulmonary complications, the need for revision surgery, among others, and the patient was willing to proceed.  OPERATIVE IMPLANTS: none  OPERATIVE FINDINGS: Examination under anesthesia: stable Diagnostic Arthroscopy:  articular cartilage:grade 3 PF/Lat Medial meniscus:stable Lateral meniscus:complex tear Anterior cruciate ligament/PCL: small partial tear Loose bodies: articular piece in medial joint space from LFC    OPERATIVE PROCEDURE:  Patient was identified in the preoperative holding area and site was marked by me female was transported to the operating theater and placed on the table in supine position taking care to pad all bony prominences. After a preincinduction time out anesthesia was induced.  female received ancef for preoperative antibiotics. The left lower extremity was prepped and draped in normal sterile fashion and a pre-incision timeout was performed.   A small stab incision was made in the anterolateral portal position. The arthroscope was introduced in the joint. A medial portal was then established under direct visualization just above the anterior horn of the medial meniscus. Diagnostic arthroscopy was then carried out with findings as described above.  I performed a chondroplasty at  the PF space and LFC  Loose body was removed from the Medial joint space.   A small portion of the acl had avulsed and was being impinged in the lateral joint space. This was debrided  I performed a partial lateral meniscectomy  The arthroscopic equipment was removed from the joint and the portals were closed with 3-0 nylon in an interrupted fashion. The knee was infiltrated with 80mg  depomedrol and 5cc marcaine.  Sterile dressings were then applied including Xeroform 4 x 4's ABDs an ACE bandage.  The patient was then allowed to awaken from general anesthesia, transferred to the stretcher and taken to the recovery room in stable condition.  POSTOPERATIVE PLAN: The patient will be discharged home today and will followup in one week for suture removal and wound check.  VTE prophylaxis: mobilize and ASA

## 2019-09-30 NOTE — Interval H&P Note (Signed)
History and Physical Interval Note:  09/30/2019 7:27 AM  Kristin [name]  has presented today for surgery, with the diagnosis of RIGHT KNEE LATERAL MENISCUS TEAR.  The various methods of treatment have been discussed with the patient and family. After consideration of risks, benefits and other options for treatment, the patient has consented to  Procedure(s): KNEE ARTHROSCOPY WITH LATERAL MENISECTOMY (Right) as a surgical intervention.  The patient's history has been reviewed, patient examined, no change in status, stable for surgery.  I have reviewed the patient's chart and labs.  Questions were answered to the patient's satisfaction.     Sheral Apley

## 2019-09-30 NOTE — Anesthesia Procedure Notes (Signed)
Procedure Name: LMA Insertion Date/Time: 09/30/2019 8:57 AM Performed by: Cleda Clarks, CRNA Pre-anesthesia Checklist: Patient identified, Emergency Drugs available, Suction available and Patient being monitored Patient Re-evaluated:Patient Re-evaluated prior to induction Oxygen Delivery Method: Circle system utilized Preoxygenation: Pre-oxygenation with 100% oxygen Induction Type: IV induction Ventilation: Mask ventilation without difficulty LMA: LMA inserted LMA Size: 4.0 Number of attempts: 1 Placement Confirmation: positive ETCO2 Tube secured with: Tape Dental Injury: Teeth and Oropharynx as per pre-operative assessment

## 2019-09-30 NOTE — Transfer of Care (Signed)
Immediate Anesthesia Transfer of Care Note  [name]  Procedure(s) Performed: KNEE ARTHROSCOPY WITH LATERAL MENISECTOMY (Right Knee)  Patient Location: PACU  Anesthesia Type:General  Level of Consciousness: awake, alert  and oriented  Airway & Oxygen Therapy: Patient Spontanous Breathing and Patient connected to nasal cannula oxygen  Post-op Assessment: Report given to RN and Post -op Vital signs reviewed and stable  Post vital signs: Reviewed and stable  Last Vitals:  Vitals Value Taken Time  BP 150/101 09/30/19 0938  Temp    Pulse 95 09/30/19 0940  Resp 13 09/30/19 0940  SpO2 100 % 09/30/19 0940  Vitals shown include unvalidated device data.  Last Pain:  Vitals:   09/30/19 0703  TempSrc: Oral  PainSc: 0-No pain      Patients Stated Pain Goal: 5 (09/30/19 0703)  Complications: No complications documented.

## 2019-09-30 NOTE — Discharge Instructions (Signed)

## 2019-09-30 NOTE — Anesthesia Postprocedure Evaluation (Signed)
Anesthesia Post Note  [name]  Procedure(s) Performed: KNEE ARTHROSCOPY WITH LATERAL MENISECTOMY (Right Knee)     Patient location during evaluation: PACU Anesthesia Type: General Level of consciousness: awake and alert Pain management: pain level controlled Vital Signs Assessment: post-procedure vital signs reviewed and stable Respiratory status: spontaneous breathing, nonlabored ventilation, respiratory function stable and patient connected to nasal cannula oxygen Cardiovascular status: blood pressure returned to baseline and stable Postop Assessment: no apparent nausea or vomiting Anesthetic complications: no   No complications documented.  Last Vitals:  Vitals:   09/30/19 1030 09/30/19 1123  BP: 120/79 (!) 136/94  Pulse: 88 85  Resp: 12 16  Temp: 36.7 C 36.8 C  SpO2: 100% 100%    Last Pain:  Vitals:   09/30/19 1124  TempSrc:   PainSc: 4                  Trevor Iha

## 2019-10-01 ENCOUNTER — Encounter (HOSPITAL_BASED_OUTPATIENT_CLINIC_OR_DEPARTMENT_OTHER): Payer: Self-pay | Admitting: Orthopedic Surgery

## 2019-10-01 ENCOUNTER — Encounter: Payer: Self-pay | Admitting: Gastroenterology

## 2019-10-05 ENCOUNTER — Ambulatory Visit (HOSPITAL_COMMUNITY)
Admission: EM | Admit: 2019-10-05 | Discharge: 2019-10-05 | Disposition: A | Discharge status: Home / Self Care | Payer: 59 | Attending: Emergency Medicine | Admitting: Emergency Medicine

## 2019-10-05 ENCOUNTER — Other Ambulatory Visit: Payer: Self-pay

## 2019-10-05 ENCOUNTER — Encounter (HOSPITAL_COMMUNITY): Payer: Self-pay

## 2019-10-05 DIAGNOSIS — R0982 Postnasal drip: Secondary | ICD-10-CM | POA: Diagnosis present

## 2019-10-05 DIAGNOSIS — J029 Acute pharyngitis, unspecified: Secondary | ICD-10-CM | POA: Diagnosis not present

## 2019-10-05 DIAGNOSIS — B370 Candidal stomatitis: Secondary | ICD-10-CM | POA: Insufficient documentation

## 2019-10-05 LAB — POCT RAPID STREP A: Streptococcus, Group A Screen (Direct): NEGATIVE

## 2019-10-05 MED ORDER — CLOTRIMAZOLE 10 MG MT TROC
10.0000 mg | Freq: Every day | OROMUCOSAL | 0 refills | Status: DC
Start: 2019-10-05 — End: 2019-10-15

## 2019-10-05 MED ORDER — LIDOCAINE VISCOUS HCL 2 % MT SOLN
OROMUCOSAL | Status: AC
  Filled 2019-10-05: qty 15

## 2019-10-05 MED ORDER — ALUM & MAG HYDROXIDE-SIMETH 200-200-20 MG/5ML PO SUSP
30.0000 mL | Freq: Once | ORAL | Status: AC
  Administered 2019-10-05: 30 mL via ORAL

## 2019-10-05 MED ORDER — CETIRIZINE HCL 10 MG PO CAPS
10.0000 mg | ORAL_CAPSULE | Freq: Every day | ORAL | 0 refills | Status: DC
Start: 2019-10-05 — End: 2019-10-10

## 2019-10-05 MED ORDER — ONDANSETRON 4 MG PO TBDP
4.0000 mg | ORAL_TABLET | Freq: Three times a day (TID) | ORAL | 0 refills | Status: DC | PRN
Start: 2019-10-05 — End: 2019-10-15

## 2019-10-05 MED ORDER — LIDOCAINE VISCOUS HCL 2 % MT SOLN
15.0000 mL | Freq: Once | OROMUCOSAL | Status: AC
  Administered 2019-10-05: 15 mL via ORAL

## 2019-10-05 MED ORDER — ALUM & MAG HYDROXIDE-SIMETH 200-200-20 MG/5ML PO SUSP
ORAL | Status: AC
  Filled 2019-10-05: qty 30

## 2019-10-05 NOTE — ED Triage Notes (Signed)
Pt present sore throat, symptoms started on the 7/21. She is having difficulty swallowing and a burning sensation .

## 2019-10-05 NOTE — Discharge Instructions (Addendum)
Please use clotrimazole tablets-dissolves in mouth 5 times daily for the next 1 to 2 weeks monitor for improvement of symptoms and white spots within mouth  Please also try daily antihistamine of cetirizine/Zyrtec or loratadine/Claritin to help with postnasal drainage contributing to symptoms  Continue reflux medicines and follow-up with gastroenterology as planned; may use over the counter maalox if Gi cocktail helpful Zofran/ondansetron as needed for nausea  Please follow-up if any symptoms not improving or worsening

## 2019-10-06 NOTE — ED Provider Notes (Signed)
[facility]    CSN: [id number] Arrival date & time: 10/05/19  1637      History   Chief Complaint Chief Complaint  Patient presents with  . Sore Throat    HPI Kristin [name] is a 44 y.o. female history of GERD, recent meniscectomy presenting today for evaluation of sore throat.  Patient reports over the past 4 days she has had a sore throat.  She initially felt as if her tonsils were swollen and was concerned about strep, but of recently has had more of a burning sensation.  She reports she has significant history of GERD and has tried many medicines for this.  Currently she takes Pepcid and Prilosec.  Has plans to follow-up with gastroenterology in September as she recently has had some admissions related to nausea and vomiting.  Has had imaging so I think esophagitis.  She denies cough, has had some mucus and drainage in her throat and mild rhinorrhea.  Denies fevers.  HPI  Past Medical History:  Diagnosis Date  . Acid reflux   . ACL tear 2011   not sure which side  . Acute lateral meniscus tear of right knee   . Anxiety   . Arthritis   . Back pain    slipped disc lower back lumbar  . Depression   . Eczema   . Hypertension   . Migraine headache   . Neuromuscular disorder (HCC)    leg cramps    Patient Active Problem List   Diagnosis Date Noted  . UTI (urinary tract infection) 08/06/2019  . Frequent urination 07/29/2019  . Anxiety 07/07/2019  . Suicidal ideation 08/08/2018  . Vaginal irritation 03/07/2018  . Acute cystitis without hematuria 11/09/2017  . Injury of toe on right foot 10/16/2017  . Esophagitis determined by endoscopy   . Dehydration   . Nausea & vomiting 09/18/2017  . AKI (acute kidney injury) (HCC)   . Bipolar disorder in remission (HCC)   . Back pain 08/25/2016  . Generalized anxiety disorder 08/23/2015  . Osteoarthritis of right knee 12/21/2014  . Dysuria 04/22/2014  . Vaginal discharge 04/22/2014  . Tinea pedis of both feet  10/02/2013  . Menorrhagia 09/04/2012  . Allergic rhinitis 06/12/2012  . Eczema 03/26/2012  . Alcohol abuse 10/26/2010  . Bipolar 1 disorder (HCC) 10/09/2010  . Panic attacks 10/09/2010  . INSOMNIA, CHRONIC 06/29/2009  . TOBACCO USER 01/23/2009  . Obesity 10/29/2008  . Essential hypertension 12/20/2006    Past Surgical History:  Procedure Laterality Date  . BIOPSY  09/19/2017   Procedure: BIOPSY;  Surgeon: Kathi Der, MD;  Location: [facility];  Service: Gastroenterology;;  . [name] & CURRETTAGE/HYSTROSCOPY WITH HYDROTHERMAL ABLATION N/A 04/04/2017   Procedure: DILATATION & CURETTAGE/HYSTEROSCOPY WITH HYDROTHERMAL ABLATION;  Surgeon: Reva Bores, MD;  Location: [facility];  Service: Gynecology;  Laterality: N/A;  . ESOPHAGOGASTRODUODENOSCOPY (EGD) WITH PROPOFOL N/A 09/19/2017   Procedure: ESOPHAGOGASTRODUODENOSCOPY (EGD) WITH PROPOFOL;  Surgeon: Kathi Der, MD;  Location: [facility];  Service: Gastroenterology;  Laterality: N/A;  . KNEE ARTHROSCOPY WITH LATERAL MENISECTOMY Right 09/30/2019   Procedure: KNEE ARTHROSCOPY WITH LATERAL MENISECTOMY;  Surgeon: Sheral Apley, MD;  Location: [facility];  Service: Orthopedics;  Laterality: Right;  . TUBAL LIGATION  yrs ago    OB History    Gravida  4   Para  3   Term  3   Preterm      AB  1   Living  3  SAB      TAB  1   Ectopic      Multiple      Live Births               Home Medications    Prior to Admission medications   Medication Sig Start Date End Date Taking? Authorizing Provider  acetaminophen (TYLENOL) 500 MG tablet Take 1 tablet (500 mg total) by mouth every 8 (eight) hours as needed for mild pain. 07/04/19   Marthenia Rolling, DO  aspirin EC 81 MG tablet Take 1 tablet (81 mg total) by mouth 2 (two) times daily. For DVT prophylaxis for 30 days after surgery. 09/30/19   McBane, Jerald Kief, PA-C  Cetirizine HCl 10 MG CAPS Take 1 capsule (10 mg total) by  mouth daily for 10 days. 10/05/19 10/15/19  Keyshon Stein C, PA-C  clotrimazole (MYCELEX) 10 MG troche Take 1 tablet (10 mg total) by mouth 5 (five) times daily for 14 days. 10/05/19 10/19/19  Nicholle Falzon C, PA-C  famotidine (PEPCID) 20 MG tablet TAKE 1 TABLET(20 MG) BY MOUTH TWICE DAILY 09/29/19   Derrel Nip, MD  gabapentin (NEURONTIN) 300 MG capsule Take 1 capsule (300 mg total) by mouth 3 (three) times daily. 06/25/19   Moses Manners, MD  HYDROcodone-acetaminophen (NORCO) 5-325 MG tablet Take 1-2 tablets by mouth every 6 (six) hours as needed for moderate pain. MAXIMUM TOTAL ACETAMINOPHEN DOSE IS 4000 MG PER DAY 09/30/19   McBane, Jerald Kief, PA-C  hydrOXYzine (VISTARIL) 25 MG capsule Take 1 capsule (25 mg total) by mouth daily as needed for anxiety. 07/04/19   Marthenia Rolling, DO  ibuprofen (ADVIL) 600 MG tablet TAKE 1 TABLET(600 MG) BY MOUTH TWICE DAILY AS NEEDED 09/29/19   Derrel Nip, MD  lisinopril-hydrochlorothiazide (ZESTORETIC) 10-12.5 MG tablet Take 1 tablet by mouth daily. 07/04/19   Marthenia Rolling, DO  magnesium oxide (MAG-OX) 400 MG tablet Take 1 tablet (400 mg total) by mouth daily. 09/03/19   Renne Crigler, PA-C  metroNIDAZOLE (FLAGYL) 500 MG tablet Take 1 tablet (500 mg total) by mouth 2 (two) times daily. 09/23/19   Derrel Nip, MD  OLANZapine (ZYPREXA) 5 MG tablet Take 1 tablet (5 mg total) by mouth at bedtime. 09/23/19   Derrel Nip, MD  omeprazole (PRILOSEC) 40 MG capsule Take 1 capsule (40 mg total) by mouth daily. 09/23/19   Derrel Nip, MD  ondansetron (ZOFRAN ODT) 4 MG disintegrating tablet Take 1 tablet (4 mg total) by mouth every 8 (eight) hours as needed for nausea or vomiting. 10/05/19   Shaunie Boehm C, PA-C  ondansetron (ZOFRAN) 4 MG tablet Take 1 tablet (4 mg total) by mouth every 8 (eight) hours as needed for nausea or vomiting. 09/30/19   McBane, Jerald Kief, PA-C  potassium chloride SA (KLOR-CON) 20 MEQ tablet Take 1 tablet (20 mEq total) by mouth 2  (two) times daily. 09/03/19   Renne Crigler, PA-C  tiZANidine (ZANAFLEX) 4 MG tablet Take 1-2 tablets (4-8 mg total) by mouth every 6 (six) hours as needed for muscle spasms. 07/04/19   Marthenia Rolling, DO  triamcinolone ointment (KENALOG) 0.1 % Apply 1 application topically 2 (two) times daily. 06/25/19   Moses Manners, MD  fluticasone (FLONASE) 50 MCG/ACT nasal spray Place 2 sprays into both nostrils daily. Patient taking differently: Place 2 sprays into both nostrils daily as needed for allergies.  02/25/17 04/14/19  Belinda Fisher, PA-C  promethazine (PHENERGAN) 25 MG tablet Take 1 tablet (25 mg total) by  mouth every 6 (six) hours as needed for nausea or vomiting. 04/17/18 08/29/18  Jacalyn Lefevre, MD    Family History Family History  Problem Relation Age of Onset  . Cancer Maternal Grandmother        Breast  . Hypertension Mother   . Hypertension Brother   . Cancer Maternal Uncle        colon  . Bipolar disorder Cousin   . Schizophrenia Cousin     Social History Social History   Tobacco Use  . Smoking status: Current Some Day Smoker    Packs/day: 0.25    Years: 5.00    Pack years: 1.25    Types: Cigarettes  . Smokeless tobacco: Never Used  Vaping Use  . Vaping Use: Never used  Substance Use Topics  . Alcohol use: Yes    Comment: occassionally, longer than one week ago  . Drug use: No     Allergies   Sumatriptan, Other, and Eggs or egg-derived products   Review of Systems Review of Systems  Constitutional: Negative for activity change, appetite change, chills, fatigue and fever.  HENT: Positive for congestion, rhinorrhea and sore throat. Negative for ear pain, sinus pressure and trouble swallowing.   Eyes: Negative for discharge and redness.  Respiratory: Negative for cough, chest tightness and shortness of breath.   Cardiovascular: Negative for chest pain.  Gastrointestinal: Negative for abdominal pain, diarrhea, nausea and vomiting.  Musculoskeletal: Negative for  myalgias.  Skin: Negative for rash.  Neurological: Negative for dizziness, light-headedness and headaches.     Physical Exam Triage Vital Signs ED Triage Vitals  Enc Vitals Group     BP 10/05/19 1733 (!) 122/87     Pulse Rate 10/05/19 1733 (!) 106     Resp 10/05/19 1733 16     Temp 10/05/19 1733 99.1 F (37.3 C)     Temp Source 10/05/19 1733 Oral     SpO2 10/05/19 1733 100 %     Weight --      Height --      Head Circumference --      Peak Flow --      Pain Score 10/05/19 1734 7     Pain Loc --      Pain Edu? --      Excl. in GC? --    No data found.  Updated Vital Signs BP (!) 122/87 (BP Location: Left Arm)   Pulse (!) 106   Temp 99.1 F (37.3 C) (Oral)   Resp 16   SpO2 100%   Visual Acuity Right Eye Distance:   Left Eye Distance:   Bilateral Distance:    Right Eye Near:   Left Eye Near:    Bilateral Near:     Physical Exam Vitals and nursing note reviewed.  Constitutional:      Appearance: She is well-developed.     Comments: No acute distress  HENT:     Head: Normocephalic and atraumatic.     Ears:     Comments: Bilateral ears without tenderness to palpation of external auricle, tragus and mastoid, EAC's without erythema or swelling, TM's with good bony landmarks and cone of light. Non erythematous.     Nose: Nose normal.     Mouth/Throat:     Comments: Oral mucosa erythematous with white removable patches diffusely, no soft palate swelling, tonsils without swelling or erythema, posterior pharynx patent Eyes:     Conjunctiva/sclera: Conjunctivae normal.  Cardiovascular:     Rate and Rhythm:  Normal rate.  Pulmonary:     Effort: Pulmonary effort is normal. No respiratory distress.     Comments: Breathing comfortably at rest, CTABL, no wheezing, rales or other adventitious sounds auscultated  Abdominal:     General: There is no distension.  Musculoskeletal:        General: Normal range of motion.     Cervical back: Neck supple.  Skin:     General: Skin is warm and dry.  Neurological:     Mental Status: She is alert and oriented to person, place, and time.      UC Treatments / Results  Labs (all labs ordered are listed, but only abnormal results are displayed) Labs Reviewed  CULTURE, GROUP A STREP [facility])  POCT RAPID STREP A    EKG   Radiology No results found.  Procedures Procedures (including critical care time)  Medications Ordered in UC Medications  alum & mag hydroxide-simeth (MAALOX/MYLANTA) 200-200-20 MG/5ML suspension 30 mL (30 mLs Oral Given 10/05/19 1824)    And  lidocaine (XYLOCAINE) 2 % viscous mouth solution 15 mL (15 mLs Oral Given 10/05/19 1825)    Initial Impression / Assessment and Plan / UC Course  I have reviewed the triage vital signs and the nursing notes.  Pertinent labs & imaging results that were available during my care of the patient were reviewed by me and considered in my medical decision making (see chart for details).     Exam suggestive of oral thrush, patient reports she recently was on antibiotics.  Treating with clotrimazole troches 5 times daily.  Also mucus present in posterior pharynx, initiated on daily cetirizine to help with any postnasal drainage contributing to symptoms.  Continue GERD medicines.  Zofran for nausea.  Follow-up with GI as planned.  Strep test negative today.  Follow-up if any symptoms not improving or worsening.  Discussed strict return precautions. Patient verbalized understanding and is agreeable with plan.  Final Clinical Impressions(s) / UC Diagnoses   Final diagnoses:  Oral thrush  Post-nasal drainage     Discharge Instructions     Please use clotrimazole tablets-dissolves in mouth 5 times daily for the next 1 to 2 weeks monitor for improvement of symptoms and white spots within mouth  Please also try daily antihistamine of cetirizine/Zyrtec or loratadine/Claritin to help with postnasal drainage contributing to symptoms  Continue reflux  medicines and follow-up with gastroenterology as planned; may use over the counter maalox if Gi cocktail helpful Zofran/ondansetron as needed for nausea  Please follow-up if any symptoms not improving or worsening   ED Prescriptions    Medication Sig Dispense Auth. Provider   clotrimazole (MYCELEX) 10 MG troche Take 1 tablet (10 mg total) by mouth 5 (five) times daily for 14 days. 70 tablet Nicki Gracy C, PA-C   Cetirizine HCl 10 MG CAPS Take 1 capsule (10 mg total) by mouth daily for 10 days. 10 capsule Bryn Saline C, PA-C   ondansetron (ZOFRAN ODT) 4 MG disintegrating tablet Take 1 tablet (4 mg total) by mouth every 8 (eight) hours as needed for nausea or vomiting. 20 tablet Sigourney Portillo, Viola C, PA-C     PDMP not reviewed this encounter.   Lew Dawes, [place] 10/06/19 1341

## 2019-10-07 ENCOUNTER — Other Ambulatory Visit: Payer: Self-pay | Admitting: Family Medicine

## 2019-10-07 DIAGNOSIS — K209 Esophagitis, unspecified without bleeding: Secondary | ICD-10-CM

## 2019-10-08 LAB — CULTURE, GROUP A STREP (THRC)

## 2019-10-10 ENCOUNTER — Observation Stay (HOSPITAL_COMMUNITY): Payer: 59

## 2019-10-10 ENCOUNTER — Encounter (HOSPITAL_COMMUNITY): Payer: Self-pay | Admitting: Internal Medicine

## 2019-10-10 ENCOUNTER — Inpatient Hospital Stay (HOSPITAL_COMMUNITY)
Admission: EM | Admit: 2019-10-10 | Discharge: 2019-10-15 | DRG: 315 | Disposition: A | Discharge status: Home / Self Care | Payer: 59 | Attending: Family Medicine | Admitting: Family Medicine

## 2019-10-10 ENCOUNTER — Other Ambulatory Visit: Payer: Self-pay

## 2019-10-10 DIAGNOSIS — R112 Nausea with vomiting, unspecified: Secondary | ICD-10-CM

## 2019-10-10 DIAGNOSIS — R10819 Abdominal tenderness, unspecified site: Secondary | ICD-10-CM

## 2019-10-10 DIAGNOSIS — R739 Hyperglycemia, unspecified: Secondary | ICD-10-CM | POA: Diagnosis present

## 2019-10-10 DIAGNOSIS — H5500 Unspecified nystagmus: Secondary | ICD-10-CM | POA: Diagnosis present

## 2019-10-10 DIAGNOSIS — Z7982 Long term (current) use of aspirin: Secondary | ICD-10-CM

## 2019-10-10 DIAGNOSIS — E878 Other disorders of electrolyte and fluid balance, not elsewhere classified: Secondary | ICD-10-CM | POA: Diagnosis present

## 2019-10-10 DIAGNOSIS — Z8719 Personal history of other diseases of the digestive system: Secondary | ICD-10-CM

## 2019-10-10 DIAGNOSIS — K640 First degree hemorrhoids: Secondary | ICD-10-CM | POA: Diagnosis present

## 2019-10-10 DIAGNOSIS — R7989 Other specified abnormal findings of blood chemistry: Secondary | ICD-10-CM

## 2019-10-10 DIAGNOSIS — R55 Syncope and collapse: Secondary | ICD-10-CM

## 2019-10-10 DIAGNOSIS — K2100 Gastro-esophageal reflux disease with esophagitis, without bleeding: Secondary | ICD-10-CM | POA: Diagnosis present

## 2019-10-10 DIAGNOSIS — Z800 Family history of malignant neoplasm of digestive organs: Secondary | ICD-10-CM

## 2019-10-10 DIAGNOSIS — K2900 Acute gastritis without bleeding: Secondary | ICD-10-CM | POA: Diagnosis present

## 2019-10-10 DIAGNOSIS — Z791 Long term (current) use of non-steroidal anti-inflammatories (NSAID): Secondary | ICD-10-CM

## 2019-10-10 DIAGNOSIS — F419 Anxiety disorder, unspecified: Secondary | ICD-10-CM | POA: Diagnosis present

## 2019-10-10 DIAGNOSIS — Z79891 Long term (current) use of opiate analgesic: Secondary | ICD-10-CM

## 2019-10-10 DIAGNOSIS — M1711 Unilateral primary osteoarthritis, right knee: Secondary | ICD-10-CM | POA: Diagnosis present

## 2019-10-10 DIAGNOSIS — R52 Pain, unspecified: Secondary | ICD-10-CM

## 2019-10-10 DIAGNOSIS — M1731 Unilateral post-traumatic osteoarthritis, right knee: Secondary | ICD-10-CM

## 2019-10-10 DIAGNOSIS — F418 Other specified anxiety disorders: Secondary | ICD-10-CM | POA: Diagnosis present

## 2019-10-10 DIAGNOSIS — I959 Hypotension, unspecified: Principal | ICD-10-CM | POA: Diagnosis present

## 2019-10-10 DIAGNOSIS — D62 Acute posthemorrhagic anemia: Secondary | ICD-10-CM

## 2019-10-10 DIAGNOSIS — E860 Dehydration: Secondary | ICD-10-CM | POA: Diagnosis not present

## 2019-10-10 DIAGNOSIS — Z803 Family history of malignant neoplasm of breast: Secondary | ICD-10-CM

## 2019-10-10 DIAGNOSIS — R1310 Dysphagia, unspecified: Secondary | ICD-10-CM | POA: Diagnosis present

## 2019-10-10 DIAGNOSIS — D649 Anemia, unspecified: Secondary | ICD-10-CM

## 2019-10-10 DIAGNOSIS — N179 Acute kidney failure, unspecified: Secondary | ICD-10-CM | POA: Diagnosis not present

## 2019-10-10 DIAGNOSIS — K2980 Duodenitis without bleeding: Secondary | ICD-10-CM | POA: Diagnosis present

## 2019-10-10 DIAGNOSIS — E876 Hypokalemia: Secondary | ICD-10-CM | POA: Diagnosis present

## 2019-10-10 DIAGNOSIS — Z79899 Other long term (current) drug therapy: Secondary | ICD-10-CM

## 2019-10-10 DIAGNOSIS — R140 Abdominal distension (gaseous): Secondary | ICD-10-CM

## 2019-10-10 DIAGNOSIS — K2950 Unspecified chronic gastritis without bleeding: Secondary | ICD-10-CM | POA: Diagnosis present

## 2019-10-10 DIAGNOSIS — D509 Iron deficiency anemia, unspecified: Secondary | ICD-10-CM | POA: Diagnosis present

## 2019-10-10 DIAGNOSIS — F17210 Nicotine dependence, cigarettes, uncomplicated: Secondary | ICD-10-CM | POA: Diagnosis present

## 2019-10-10 DIAGNOSIS — D638 Anemia in other chronic diseases classified elsewhere: Secondary | ICD-10-CM | POA: Diagnosis present

## 2019-10-10 DIAGNOSIS — Z20822 Contact with and (suspected) exposure to covid-19: Secondary | ICD-10-CM | POA: Diagnosis present

## 2019-10-10 DIAGNOSIS — I10 Essential (primary) hypertension: Secondary | ICD-10-CM | POA: Diagnosis present

## 2019-10-10 DIAGNOSIS — K760 Fatty (change of) liver, not elsewhere classified: Secondary | ICD-10-CM | POA: Diagnosis present

## 2019-10-10 DIAGNOSIS — Z8249 Family history of ischemic heart disease and other diseases of the circulatory system: Secondary | ICD-10-CM

## 2019-10-10 DIAGNOSIS — R945 Abnormal results of liver function studies: Secondary | ICD-10-CM

## 2019-10-10 DIAGNOSIS — F1010 Alcohol abuse, uncomplicated: Secondary | ICD-10-CM | POA: Diagnosis present

## 2019-10-10 DIAGNOSIS — F3170 Bipolar disorder, currently in remission, most recent episode unspecified: Secondary | ICD-10-CM | POA: Diagnosis present

## 2019-10-10 DIAGNOSIS — R634 Abnormal weight loss: Secondary | ICD-10-CM | POA: Diagnosis present

## 2019-10-10 DIAGNOSIS — Z818 Family history of other mental and behavioral disorders: Secondary | ICD-10-CM

## 2019-10-10 DIAGNOSIS — R578 Other shock: Secondary | ICD-10-CM

## 2019-10-10 DIAGNOSIS — K209 Esophagitis, unspecified without bleeding: Secondary | ICD-10-CM

## 2019-10-10 DIAGNOSIS — E871 Hypo-osmolality and hyponatremia: Secondary | ICD-10-CM | POA: Diagnosis present

## 2019-10-10 LAB — FERRITIN: Ferritin: 481 ng/mL — ABNORMAL HIGH (ref 11–307)

## 2019-10-10 LAB — COMPREHENSIVE METABOLIC PANEL
ALT: 13 U/L (ref 0–44)
ALT: 9 U/L (ref 0–44)
AST: 49 U/L — ABNORMAL HIGH (ref 15–41)
AST: 30 U/L (ref 15–41)
Albumin: 3.4 g/dL — ABNORMAL LOW (ref 3.5–5.0)
Albumin: 2.2 g/dL — ABNORMAL LOW (ref 3.5–5.0)
Alkaline Phosphatase: 133 U/L — ABNORMAL HIGH (ref 38–126)
Alkaline Phosphatase: 79 U/L (ref 38–126)
Anion gap: 15 (ref 5–15)
Anion gap: 9 (ref 5–15)
BUN: 13 mg/dL (ref 6–20)
BUN: 11 mg/dL (ref 6–20)
CO2: 20 mmol/L — ABNORMAL LOW (ref 22–32)
CO2: 18 mmol/L — ABNORMAL LOW (ref 22–32)
Calcium: 7.7 mg/dL — ABNORMAL LOW (ref 8.9–10.3)
Calcium: 6.3 mg/dL — CL (ref 8.9–10.3)
Chloride: 90 mmol/L — ABNORMAL LOW (ref 98–111)
Chloride: 105 mmol/L (ref 98–111)
Creatinine, Ser: 2.06 mg/dL — ABNORMAL HIGH (ref 0.44–1.00)
Creatinine, Ser: 1.66 mg/dL — ABNORMAL HIGH (ref 0.44–1.00)
GFR calc Af Amer: 33 mL/min — ABNORMAL LOW (ref >60)
GFR calc Af Amer: 43 mL/min — ABNORMAL LOW (ref >60)
GFR calc non Af Amer: 28 mL/min — ABNORMAL LOW (ref >60)
GFR calc non Af Amer: 37 mL/min — ABNORMAL LOW (ref >60)
Glucose, Bld: 106 mg/dL — ABNORMAL HIGH (ref 70–99)
Glucose, Bld: 99 mg/dL (ref 70–99)
Potassium: 2.7 mmol/L — CL (ref 3.5–5.1)
Potassium: 3.3 mmol/L — ABNORMAL LOW (ref 3.5–5.1)
Sodium: 125 mmol/L — ABNORMAL LOW (ref 135–145)
Sodium: 132 mmol/L — ABNORMAL LOW (ref 135–145)
Total Bilirubin: 0.7 mg/dL (ref 0.3–1.2)
Total Bilirubin: 0.2 mg/dL — ABNORMAL LOW (ref 0.3–1.2)
Total Protein: 6.2 g/dL — ABNORMAL LOW (ref 6.5–8.1)
Total Protein: 4 g/dL — ABNORMAL LOW (ref 6.5–8.1)

## 2019-10-10 LAB — PREPARE RBC (CROSSMATCH)

## 2019-10-10 LAB — SARS CORONAVIRUS 2 BY RT PCR (HOSPITAL ORDER, PERFORMED IN ~~LOC~~ HOSPITAL LAB): SARS Coronavirus 2: NEGATIVE

## 2019-10-10 LAB — CBC WITH DIFFERENTIAL/PLATELET
Abs Immature Granulocytes: 0.11 10*3/uL — ABNORMAL HIGH (ref 0.00–0.07)
Abs Immature Granulocytes: 0.09 10*3/uL — ABNORMAL HIGH (ref 0.00–0.07)
Basophils Absolute: 0.1 10*3/uL (ref 0.0–0.1)
Basophils Absolute: 0 10*3/uL (ref 0.0–0.1)
Basophils Relative: 1 %
Basophils Relative: 0 %
Eosinophils Absolute: 0.2 10*3/uL (ref 0.0–0.5)
Eosinophils Absolute: 0.1 10*3/uL (ref 0.0–0.5)
Eosinophils Relative: 2 %
Eosinophils Relative: 2 %
HCT: 23.5 % — ABNORMAL LOW (ref 36.0–46.0)
HCT: 15.8 % — ABNORMAL LOW (ref 36.0–46.0)
Hemoglobin: 7.8 g/dL — ABNORMAL LOW (ref 12.0–15.0)
Hemoglobin: 5.2 g/dL — CL (ref 12.0–15.0)
Immature Granulocytes: 2 %
Immature Granulocytes: 2 %
Lymphocytes Relative: 13 %
Lymphocytes Relative: 19 %
Lymphs Abs: 0.9 10*3/uL (ref 0.7–4.0)
Lymphs Abs: 1 10*3/uL (ref 0.7–4.0)
MCH: 30.5 pg (ref 26.0–34.0)
MCH: 31.1 pg (ref 26.0–34.0)
MCHC: 33.2 g/dL (ref 30.0–36.0)
MCHC: 32.9 g/dL (ref 30.0–36.0)
MCV: 91.8 fL (ref 80.0–100.0)
MCV: 94.6 fL (ref 80.0–100.0)
Monocytes Absolute: 1.1 10*3/uL — ABNORMAL HIGH (ref 0.1–1.0)
Monocytes Absolute: 1 10*3/uL (ref 0.1–1.0)
Monocytes Relative: 16 %
Monocytes Relative: 20 %
Neutro Abs: 4.9 10*3/uL (ref 1.7–7.7)
Neutro Abs: 2.9 10*3/uL (ref 1.7–7.7)
Neutrophils Relative %: 66 %
Neutrophils Relative %: 57 %
Platelets: 175 10*3/uL (ref 150–400)
Platelets: 114 10*3/uL — ABNORMAL LOW (ref 150–400)
RBC: 2.56 MIL/uL — ABNORMAL LOW (ref 3.87–5.11)
RBC: 1.67 MIL/uL — ABNORMAL LOW (ref 3.87–5.11)
RDW: 14.1 % (ref 11.5–15.5)
RDW: 14.3 % (ref 11.5–15.5)
WBC: 7.2 10*3/uL (ref 4.0–10.5)
WBC: 5.1 10*3/uL (ref 4.0–10.5)
nRBC: 0.3 % — ABNORMAL HIGH (ref 0.0–0.2)
nRBC: 0 % (ref 0.0–0.2)

## 2019-10-10 LAB — I-STAT BETA HCG BLOOD, ED (MC, WL, AP ONLY): I-stat hCG, quantitative: <5 m[IU]/mL (ref <5)

## 2019-10-10 LAB — IRON AND TIBC
Iron: 37 ug/dL (ref 28–170)
Saturation Ratios: 38 % — ABNORMAL HIGH (ref 10.4–31.8)
TIBC: 97 ug/dL — ABNORMAL LOW (ref 250–450)
UIBC: 60 ug/dL

## 2019-10-10 LAB — ECHOCARDIOGRAM COMPLETE
Area-P 1/2: 4.06 cm2
Calc EF: 50 %
S' Lateral: 3.8 cm
Single Plane A2C EF: 46.4 %
Single Plane A4C EF: 54.7 %

## 2019-10-10 LAB — FOLATE: Folate: 4.7 ng/mL — ABNORMAL LOW (ref >5.9)

## 2019-10-10 LAB — RAPID URINE DRUG SCREEN, HOSP PERFORMED
Amphetamines: NOT DETECTED
Barbiturates: NOT DETECTED
Benzodiazepines: POSITIVE — AB
Cocaine: NOT DETECTED
Opiates: POSITIVE — AB
Tetrahydrocannabinol: NOT DETECTED

## 2019-10-10 LAB — POC OCCULT BLOOD, ED: Fecal Occult Bld: NEGATIVE

## 2019-10-10 LAB — RETICULOCYTES
Immature Retic Fract: 19.7 % — ABNORMAL HIGH (ref 2.3–15.9)
RBC.: 2.53 MIL/uL — ABNORMAL LOW (ref 3.87–5.11)
Retic Count, Absolute: 28.8 10*3/uL (ref 19.0–186.0)
Retic Ct Pct: 1.1 % (ref 0.4–3.1)

## 2019-10-10 LAB — URINALYSIS, ROUTINE W REFLEX MICROSCOPIC
Bilirubin Urine: NEGATIVE
Glucose, UA: NEGATIVE mg/dL
Ketones, ur: NEGATIVE mg/dL
Nitrite: NEGATIVE
Protein, ur: 30 mg/dL — AB
Specific Gravity, Urine: 1.015 (ref 1.005–1.030)
pH: 5 (ref 5.0–8.0)

## 2019-10-10 LAB — LACTATE DEHYDROGENASE: LDH: 157 U/L (ref 98–192)

## 2019-10-10 LAB — PROTIME-INR
INR: 1.1 (ref 0.8–1.2)
Prothrombin Time: 13.7 seconds (ref 11.4–15.2)

## 2019-10-10 LAB — ABO/RH: ABO/RH(D): O POS

## 2019-10-10 LAB — VITAMIN B12: Vitamin B-12: 1467 pg/mL — ABNORMAL HIGH (ref 180–914)

## 2019-10-10 LAB — PHOSPHORUS: Phosphorus: 3.2 mg/dL (ref 2.5–4.6)

## 2019-10-10 LAB — HEMOGLOBIN AND HEMATOCRIT, BLOOD
HCT: 19.8 % — ABNORMAL LOW (ref 36.0–46.0)
Hemoglobin: 6.5 g/dL — CL (ref 12.0–15.0)

## 2019-10-10 LAB — D-DIMER, QUANTITATIVE: D-Dimer, Quant: 0.61 ug/mL-FEU — ABNORMAL HIGH (ref 0.00–0.50)

## 2019-10-10 LAB — CORTISOL-AM, BLOOD: Cortisol - AM: 11.2 ug/dL (ref 6.7–22.6)

## 2019-10-10 LAB — TROPONIN I (HIGH SENSITIVITY)
Troponin I (High Sensitivity): 6 ng/L (ref <18)
Troponin I (High Sensitivity): 6 ng/L (ref <18)

## 2019-10-10 LAB — MAGNESIUM: Magnesium: 1.1 mg/dL — ABNORMAL LOW (ref 1.7–2.4)

## 2019-10-10 LAB — CBG MONITORING, ED: Glucose-Capillary: 132 mg/dL — ABNORMAL HIGH (ref 70–99)

## 2019-10-10 MED ORDER — POTASSIUM CHLORIDE 10 MEQ/100ML IV SOLN
10.0000 meq | INTRAVENOUS | Status: AC
  Administered 2019-10-10 (×4): 10 meq via INTRAVENOUS
  Filled 2019-10-10 (×4): qty 100

## 2019-10-10 MED ORDER — MAGNESIUM SULFATE 50 % IJ SOLN
3.0000 g | Freq: Once | INTRAVENOUS | Status: AC
  Administered 2019-10-10: 3 g via INTRAVENOUS
  Filled 2019-10-10: qty 6

## 2019-10-10 MED ORDER — POLYETHYLENE GLYCOL 3350 17 G PO PACK: 17.0000 g | PACK | Freq: Every day | ORAL | Status: DC | PRN

## 2019-10-10 MED ORDER — OXYCODONE HCL 5 MG PO TABS
5.0000 mg | ORAL_TABLET | Freq: Once | ORAL | Status: AC
  Administered 2019-10-10: 5 mg via ORAL
  Filled 2019-10-10: qty 1

## 2019-10-10 MED ORDER — HYDROXYZINE PAMOATE 25 MG PO CAPS: 25.0000 mg | ORAL_CAPSULE | Freq: Every day | ORAL | Status: DC | PRN

## 2019-10-10 MED ORDER — NALOXONE HCL 0.4 MG/ML IJ SOLN: 0.4000 mg | Freq: Once | INTRAMUSCULAR | Status: AC

## 2019-10-10 MED ORDER — HYDROXYZINE HCL 25 MG PO TABS
25.0000 mg | ORAL_TABLET | Freq: Every day | ORAL | Status: DC | PRN
  Administered 2019-10-12 – 2019-10-14 (×2): 25 mg via ORAL
  Filled 2019-10-10 (×2): qty 1

## 2019-10-10 MED ORDER — TRAZODONE HCL 50 MG PO TABS
50.0000 mg | ORAL_TABLET | Freq: Once | ORAL | Status: AC
  Administered 2019-10-10: 50 mg via ORAL
  Filled 2019-10-10: qty 1

## 2019-10-10 MED ORDER — HYDRALAZINE HCL 20 MG/ML IJ SOLN
5.0000 mg | Freq: Four times a day (QID) | INTRAMUSCULAR | Status: DC | PRN
  Administered 2019-10-12: 5 mg via INTRAVENOUS
  Filled 2019-10-10: qty 1

## 2019-10-10 MED ORDER — SODIUM CHLORIDE 0.9 % IV BOLUS
1000.0000 mL | Freq: Once | INTRAVENOUS | Status: AC
  Administered 2019-10-10: 1000 mL via INTRAVENOUS

## 2019-10-10 MED ORDER — POTASSIUM CHLORIDE CRYS ER 20 MEQ PO TBCR
40.0000 meq | EXTENDED_RELEASE_TABLET | Freq: Once | ORAL | Status: AC
  Administered 2019-10-10: 40 meq via ORAL
  Filled 2019-10-10: qty 2

## 2019-10-10 MED ORDER — OXYCODONE HCL 5 MG PO TABS: 5.0000 mg | ORAL_TABLET | ORAL | Status: DC | PRN

## 2019-10-10 MED ORDER — TIZANIDINE HCL 4 MG PO TABS
4.0000 mg | ORAL_TABLET | Freq: Four times a day (QID) | ORAL | Status: DC | PRN
  Administered 2019-10-10: 8 mg via ORAL
  Filled 2019-10-10: qty 1

## 2019-10-10 MED ORDER — ACETAMINOPHEN 325 MG PO TABS
650.0000 mg | ORAL_TABLET | Freq: Four times a day (QID) | ORAL | Status: DC | PRN
  Administered 2019-10-10 – 2019-10-13 (×4): 650 mg via ORAL
  Filled 2019-10-10 (×4): qty 2

## 2019-10-10 MED ORDER — PANTOPRAZOLE SODIUM 40 MG IV SOLR
40.0000 mg | Freq: Two times a day (BID) | INTRAVENOUS | Status: DC
  Administered 2019-10-10 – 2019-10-12 (×6): 40 mg via INTRAVENOUS
  Filled 2019-10-10 (×5): qty 40

## 2019-10-10 MED ORDER — NALOXONE HCL 0.4 MG/ML IJ SOLN
INTRAMUSCULAR | Status: AC
  Administered 2019-10-10: 0.4 mg via INTRAVENOUS
  Filled 2019-10-10: qty 1

## 2019-10-10 MED ORDER — ONDANSETRON HCL 4 MG/2ML IJ SOLN: 4.0000 mg | Freq: Four times a day (QID) | INTRAMUSCULAR | Status: DC | PRN

## 2019-10-10 MED ORDER — ACETAMINOPHEN 650 MG RE SUPP: 650.0000 mg | Freq: Four times a day (QID) | RECTAL | Status: DC | PRN

## 2019-10-10 MED ORDER — OLANZAPINE 5 MG PO TABS
5.0000 mg | ORAL_TABLET | Freq: Every day | ORAL | Status: DC
  Administered 2019-10-10 – 2019-10-14 (×5): 5 mg via ORAL
  Filled 2019-10-10 (×6): qty 1

## 2019-10-10 MED ORDER — MAGNESIUM OXIDE 400 (241.3 MG) MG PO TABS
400.0000 mg | ORAL_TABLET | Freq: Every day | ORAL | Status: DC
  Administered 2019-10-10 – 2019-10-14 (×6): 400 mg via ORAL
  Filled 2019-10-10 (×5): qty 1

## 2019-10-10 MED ORDER — NALOXONE HCL 0.4 MG/ML IJ SOLN
0.4000 mg | Freq: Once | INTRAMUSCULAR | Status: AC
  Administered 2019-10-10: 0.4 mg via INTRAVENOUS

## 2019-10-10 MED ORDER — CHLORHEXIDINE GLUCONATE CLOTH 2 % EX PADS
6.0000 | MEDICATED_PAD | Freq: Every day | CUTANEOUS | Status: DC
  Administered 2019-10-11: 6 via TOPICAL

## 2019-10-10 MED ORDER — SODIUM CHLORIDE 0.9% IV SOLUTION: Freq: Once | INTRAVENOUS | Status: DC

## 2019-10-10 MED ORDER — BISACODYL 10 MG RE SUPP: 10.0000 mg | Freq: Every day | RECTAL | Status: DC | PRN

## 2019-10-10 MED ORDER — PANTOPRAZOLE SODIUM 40 MG IV SOLR: 40.0000 mg | Freq: Two times a day (BID) | INTRAVENOUS | Status: DC

## 2019-10-10 MED ORDER — ONDANSETRON HCL 4 MG PO TABS: 4.0000 mg | ORAL_TABLET | Freq: Four times a day (QID) | ORAL | Status: DC | PRN

## 2019-10-10 MED ORDER — SODIUM CHLORIDE 0.9 % IV SOLN: INTRAVENOUS | Status: DC

## 2019-10-10 MED ORDER — CALCIUM GLUCONATE-NACL 1-0.675 GM/50ML-% IV SOLN
1.0000 g | Freq: Once | INTRAVENOUS | Status: AC
  Administered 2019-10-10: 1000 mg via INTRAVENOUS
  Filled 2019-10-10: qty 50

## 2019-10-10 NOTE — ED Notes (Signed)
Date and time results received: 10/10/19 0807 (use smartphrase ".now" to insert current time)  Test: K Critical Value: 2.7  Name of Provider Notified: MD, Bero  Orders Received? Or Actions Taken?: Orders Received - See Orders for details

## 2019-10-10 NOTE — H&P (Addendum)
History and Physical    Kristin [name] MOL:[id number] DOB: 1975/04/20 DOA: 10/10/2019  PCP: System, Pcp Not In   Patient coming from: Home  Chief Complaint: Passing out, Vomiting, Diarrhea  HPI: Kristin [name] is a 44 y.o. female with medical history significant for GERD acid reflux, recent acute lateral meniscus tear of the right knee, anxiety depression and bipolar type I, hypertension, back pain, as well as other comorbidities who presented with a chief complaint passing out twice associated with nausea vomiting diarrhea and fatigue after recent knee procedure.  Patient states that 3 weeks ago she started feeling more fatigued and has been nauseous and vomiting and had very poor p.o. intake.  States that her acid reflux is bothering her again and has had multiple episodes of emesis but denies any blood in it.  Has been taking ibuprofen intermittently since her knee procedure on the 20th.  States that overall her general health started declining and started having poor p.o. intake and also started having some diarrhea.  Feels nauseated and has been vomiting acutely for last 3 days.  States that she started having diarrhea for the last week but has not noticed any blood in it but states it was dark one time.  She continues to have heartburn symptoms with occasional dysphagia to solids.  Has not felt hungry and has lost weight and presented today with syncope x2.  Her blood pressure was significantly lower than baseline and she is given IV fluid hydration.  She denies any chest pain, lightheadedness or dizziness currently.  TRH was asked admit this patient for syncope and of note she has an acute anemia of unknown etiology so gastroenterology was consulted given her recent use of NSAIDs.  Her right knee looks okay and is unlikely the source of her bleeding.   ED Course: In the ED she had basic blood work normal as well as an FOBT and had EKG and a knee x-ray.  She was given IV fluid hydration and  gastroenterology was consulted for further evaluation recommendations.  Of note SARS-CoV-2 testing is negative.  Review of Systems: As per HPI otherwise all other systems reviewed and negative.   Past Medical History:  Diagnosis Date  . Acid reflux   . ACL tear 2011   not sure which side  . Acute lateral meniscus tear of right knee   . Anxiety   . Arthritis   . Back pain    slipped disc lower back lumbar  . Depression   . Eczema   . Hypertension   . Migraine headache   . Neuromuscular disorder (HCC)    leg cramps   Past Surgical History:  Procedure Laterality Date  . BIOPSY  09/19/2017   Procedure: BIOPSY;  Surgeon: Kathi Der, MD;  Location: [facility];  Service: Gastroenterology;;  . [name] & CURRETTAGE/HYSTROSCOPY WITH HYDROTHERMAL ABLATION N/A 04/04/2017   Procedure: DILATATION & CURETTAGE/HYSTEROSCOPY WITH HYDROTHERMAL ABLATION;  Surgeon: Reva Bores, MD;  Location: [facility];  Service: Gynecology;  Laterality: N/A;  . ESOPHAGOGASTRODUODENOSCOPY (EGD) WITH PROPOFOL N/A 09/19/2017   Procedure: ESOPHAGOGASTRODUODENOSCOPY (EGD) WITH PROPOFOL;  Surgeon: Kathi Der, MD;  Location: [facility];  Service: Gastroenterology;  Laterality: N/A;  . KNEE ARTHROSCOPY WITH LATERAL MENISECTOMY Right 09/30/2019   Procedure: KNEE ARTHROSCOPY WITH LATERAL MENISECTOMY;  Surgeon: Sheral Apley, MD;  Location: [facility];  Service: Orthopedics;  Laterality: Right;  . TUBAL LIGATION  yrs ago   SOCIAL HISTORY  reports that she  has been smoking cigarettes. She has a 1.25 pack-year smoking history. She has never used smokeless tobacco. She reports current alcohol use. She reports that she does not use drugs.  Allergies  Allergen Reactions  . Sumatriptan Anaphylaxis    High blood pressure and numb  . Other     Bananas-stomach cramps  . Eggs Or Egg-Derived Products Rash and Other (See Comments)    Stomach cramps   Family History  Problem  Relation Age of Onset  . Cancer Maternal Grandmother        Breast  . Hypertension Mother   . Hypertension Brother   . Cancer Maternal Uncle        colon  . Bipolar disorder Cousin   . Schizophrenia Cousin    Prior to Admission medications   Medication Sig Start Date End Date Taking? Authorizing Provider  acetaminophen (TYLENOL) 500 MG tablet Take 1 tablet (500 mg total) by mouth every 8 (eight) hours as needed for mild pain. 07/04/19  Yes Marthenia Rolling, DO  aspirin EC 81 MG tablet Take 1 tablet (81 mg total) by mouth 2 (two) times daily. For DVT prophylaxis for 30 days after surgery. 09/30/19  Yes McBane, Jerald Kief, PA-C  famotidine (PEPCID) 20 MG tablet TAKE 1 TABLET(20 MG) BY MOUTH TWICE DAILY Patient taking differently: Take 20 mg by mouth 2 (two) times daily.  10/07/19  Yes Derrel Nip, MD  gabapentin (NEURONTIN) 300 MG capsule Take 1 capsule (300 mg total) by mouth 3 (three) times daily. 06/25/19  Yes Hensel, Santiago Bumpers, MD  hydrOXYzine (VISTARIL) 25 MG capsule Take 1 capsule (25 mg total) by mouth daily as needed for anxiety. 07/04/19  Yes Bland, Scott, DO  ibuprofen (ADVIL) 600 MG tablet TAKE 1 TABLET(600 MG) BY MOUTH TWICE DAILY AS NEEDED Patient taking differently: Take 600 mg by mouth 2 (two) times daily as needed for fever or moderate pain.  09/29/19  Yes Derrel Nip, MD  lisinopril-hydrochlorothiazide (ZESTORETIC) 10-12.5 MG tablet Take 1 tablet by mouth daily. 07/04/19  Yes Bland, Scott, DO  magnesium oxide (MAG-OX) 400 MG tablet Take 1 tablet (400 mg total) by mouth daily. 09/03/19  Yes Renne Crigler, PA-C  OLANZapine (ZYPREXA) 5 MG tablet Take 1 tablet (5 mg total) by mouth at bedtime. 09/23/19  Yes Derrel Nip, MD  omeprazole (PRILOSEC) 40 MG capsule Take 1 capsule (40 mg total) by mouth daily. 09/23/19  Yes Derrel Nip, MD  ondansetron (ZOFRAN) 4 MG tablet Take 1 tablet (4 mg total) by mouth every 8 (eight) hours as needed for nausea or vomiting. 09/30/19  Yes  McBane, Jerald Kief, PA-C  tiZANidine (ZANAFLEX) 4 MG tablet Take 1-2 tablets (4-8 mg total) by mouth every 6 (six) hours as needed for muscle spasms. 07/04/19  Yes Marthenia Rolling, DO  cetirizine (ZYRTEC) 10 MG tablet Take 10 mg by mouth daily. Patient not taking: Reported on 10/10/2019 10/05/19   [provider]  clotrimazole (MYCELEX) 10 MG troche Take 1 tablet (10 mg total) by mouth 5 (five) times daily for 14 days. Patient not taking: Reported on 10/10/2019 10/05/19 10/19/19  Wieters, Junius Creamer, PA-C  HYDROcodone-acetaminophen (NORCO) 5-325 MG tablet Take 1-2 tablets by mouth every 6 (six) hours as needed for moderate pain. MAXIMUM TOTAL ACETAMINOPHEN DOSE IS 4000 MG PER DAY Patient not taking: Reported on 10/10/2019 09/30/19   Vernetta Honey, PA-C  metroNIDAZOLE (FLAGYL) 500 MG tablet Take 1 tablet (500 mg total) by mouth 2 (two) times daily. Patient not taking: Reported  on 10/10/2019 09/23/19   Derrel Nip, MD  ondansetron (ZOFRAN ODT) 4 MG disintegrating tablet Take 1 tablet (4 mg total) by mouth every 8 (eight) hours as needed for nausea or vomiting. Patient not taking: Reported on 10/10/2019 10/05/19   Wieters, Fran Lowes C, PA-C  potassium chloride SA (KLOR-CON) 20 MEQ tablet Take 1 tablet (20 mEq total) by mouth 2 (two) times daily. Patient not taking: Reported on 10/10/2019 09/03/19   Renne Crigler, PA-C  triamcinolone ointment (KENALOG) 0.1 % Apply 1 application topically 2 (two) times daily. Patient not taking: Reported on 10/10/2019 06/25/19   Moses Manners, MD  fluticasone [facility]) 50 MCG/ACT nasal spray Place 2 sprays into both nostrils daily. Patient taking differently: Place 2 sprays into both nostrils daily as needed for allergies.  02/25/17 04/14/19  Belinda Fisher, PA-C  promethazine (PHENERGAN) 25 MG tablet Take 1 tablet (25 mg total) by mouth every 6 (six) hours as needed for nausea or vomiting. 04/17/18 08/29/18  Jacalyn Lefevre, MD   Physical Exam: Vitals:   10/10/19 0900  10/10/19 0930 10/10/19 1000 10/10/19 1030  BP: 116/81 118/81 106/80 (!) 128/86  Pulse: 98 96 102 101  Resp: Temp:      TempSrc:      SpO2: 100% 100% 100% 100%   Constitutional: WN/WD overweight AAF in NAD and appears calm and comfortable Eyes: Lids and conjunctivae normal, sclerae anicteric  ENMT: External Ears, Nose appear normal. Grossly normal hearing. MM are Dry Neck: Appears normal, supple, no cervical masses, normal ROM, no appreciable thyromegaly Respiratory: Diminished to auscultation bilaterally, no wheezing, rales, rhonchi or crackles. Normal respiratory effort and patient is not tachypenic. No accessory muscle use. Unlabored breathing   Cardiovascular: RRR, no murmurs / rubs / gallops. S1 and S2 auscultated. No appreciable LE edema Abdomen: Soft, non-tender, Distended slightly.  Bowel sounds positive.  GU: Deferred. Musculoskeletal: No clubbing / cyanosis of digits/nails. No joint deformity upper and lower extremities and Right Knee was wrapped and when unwrapped not swollen or bruised.  Skin: No rashes, lesions, ulcers on a limited skin evaluation. No induration; Warm and dry.  Neurologic: CN 2-12 grossly intact with no focal deficits.. Romberg sign cerebellar reflexes not assessed.  Psychiatric: Normal judgment and insight. Alert and oriented x 3. Normal mood and appropriate affect.   Labs on Admission: I have personally reviewed following labs and imaging studies  CBC: Recent Labs  Lab 10/10/19 0712  WBC 7.2  NEUTROABS 4.9  HGB 7.8*  HCT 23.5*  MCV 91.8  PLT 175   Basic Metabolic Panel: Recent Labs  Lab 10/10/19 0712  NA 125*  K 2.7*  CL 90*  CO2 20*  GLUCOSE 106*  BUN 13  CREATININE 2.06*  CALCIUM 7.7*   GFR: Estimated Creatinine Clearance: 35 mL/min (A) (by C-G formula based on SCr of 2.06 mg/dL (H)). Liver Function Tests: Recent Labs  Lab 10/10/19 0712  AST 49*  ALT 13  ALKPHOS 133*  BILITOT 0.7  PROT 6.2*  ALBUMIN 3.4*   No  results for input(s): LIPASE, AMYLASE in the last 168 hours. No results for input(s): AMMONIA in the last 168 hours. Coagulation Profile: No results for input(s): INR, PROTIME in the last 168 hours. Cardiac Enzymes: No results for input(s): CKTOTAL, CKMB, CKMBINDEX, TROPONINI in the last 168 hours. BNP (last 3 results) No results for input(s): PROBNP in the last 8760 hours. HbA1C: No results for input(s): HGBA1C in the last 72 hours. CBG: No results  for input(s): GLUCAP in the last 168 hours. Lipid Profile: No results for input(s): CHOL, HDL, LDLCALC, TRIG, CHOLHDL, LDLDIRECT in the last 72 hours. Thyroid Function Tests: No results for input(s): TSH, T4TOTAL, FREET4, T3FREE, THYROIDAB in the last 72 hours. Anemia Panel: Recent Labs    10/10/19 0712  RETICCTPCT 1.1   Urine analysis:    Component Value Date/Time   COLORURINE YELLOW 10/10/2019 0738   APPEARANCEUR HAZY (A) 10/10/2019 0738   LABSPEC 1.015 10/10/2019 0738   PHURINE 5.0 10/10/2019 0738   GLUCOSEU NEGATIVE 10/10/2019 0738   HGBUR SMALL (A) 10/10/2019 0738   BILIRUBINUR NEGATIVE 10/10/2019 0738   BILIRUBINUR moderate (A) 09/23/2019 1652   BILIRUBINUR NEG 04/22/2014 1630   KETONESUR NEGATIVE 10/10/2019 0738   PROTEINUR 30 (A) 10/10/2019 0738   UROBILINOGEN 2.0 (A) 09/23/2019 1652   UROBILINOGEN 0.2 01/12/2019 1329   NITRITE NEGATIVE 10/10/2019 0738   LEUKOCYTESUR MODERATE (A) 10/10/2019 0738   Sepsis Labs: !!!!!!!!!!!!!!!!!!!!!!!!!!!!!!!!!!!!!!!!!!!! @LABRCNTIP (procalcitonin:4,lacticidven:4) ) Recent Results (from the past 240 hour(s))  Culture, group A strep (throat)     Status: None   Collection Time: 10/05/19  5:52 PM   Specimen: Throat  Result Value Ref Range Status   Specimen Description THROAT  Final   Special Requests NONE  Final   Culture   Final    NO GROUP A STREP (S.PYOGENES) ISOLATED Performed at [facility], [address], [place], [place]    Report Status 10/08/2019 FINAL   Final  SARS Coronavirus 2 by RT PCR (hospital order, performed in [facility] hospital lab) Nasopharyngeal Nasopharyngeal Swab     Status: None   Collection Time: 10/10/19  8:33 AM   Specimen: Nasopharyngeal Swab  Result Value Ref Range Status   SARS Coronavirus 2 NEGATIVE NEGATIVE Final    Comment: (NOTE) SARS-CoV-2 target nucleic acids are NOT DETECTED.  The SARS-CoV-2 RNA is generally detectable in upper and lower respiratory specimens during the acute phase of infection. The lowest concentration of SARS-CoV-2 viral copies this assay can detect is 250 copies / mL. A negative result does not preclude SARS-CoV-2 infection and should not be used as the sole basis for treatment or other patient management decisions.  A negative result may occur with improper specimen collection / handling, submission of specimen other than nasopharyngeal swab, presence of viral mutation(s) within the areas targeted by this assay, and inadequate number of viral copies (<250 copies / mL). A negative result must be combined with clinical observations, patient history, and epidemiological information.  Fact Sheet for Patients:   10/12/19  Fact Sheet for Healthcare Providers: BoilerBrush.com.cy  This test is not yet approved or  cleared by the https://pope.com/ FDA and has been authorized for detection and/or diagnosis of SARS-CoV-2 by FDA under an Emergency Use Authorization (EUA).  This EUA will remain in effect (meaning this test can be used) for the duration of the COVID-19 declaration under Section 564(b)(1) of the Act, 21 U.S.C. section 360bbb-3(b)(1), unless the authorization is terminated or revoked sooner.  Performed at [facility], [address] [place], [place]      Radiological Exams on Admission: DG Knee 1-2 Views Right  Result Date: 10/10/2019 CLINICAL DATA:  Pain.  Recent knee arthroscopy. EXAM:  RIGHT KNEE - 1-2 VIEW COMPARISON:  January 12, 2019 FINDINGS: No acute fracture or dislocation. Mild degenerative changes of the medial and patellofemoral compartments. Enthesiophyte at the quadriceps tendon insertion on the patella. There is a new well corticated  ossific density projecting over the posterior joint space in comparison to prior. This may reflect a loose body versus new osteophyte. No area of erosion or osseous destruction. No unexpected radiopaque foreign body. Soft tissues are unremarkable. IMPRESSION: 1. New well corticated ossific density projecting over the posterior joint space in comparison to prior. This may reflect a loose body versus new osteophyte. 2. Mild medial and patellofemoral compartment osteoarthritis. Electronically Signed   By: Meda Klinefelter MD   On: 10/10/2019 09:17    EKG: Independently reviewed. Showed a Sinus Rhythm at a rate of 90 with a QTc of 414. No evidence of any ST Elevation on my interpretation  Assessment/Plan Active Problems:   Osteoarthritis of right knee   AKI (acute kidney injury) (HCC)   Bipolar disorder in remission (HCC)   Dehydration   Esophagitis determined by endoscopy   Syncope   Syncope the setting of hypotension and Dehydration -Place in observation telemetry Continue with cardiac monitoring on telemetry -Checked cardiac troponins and they're flat and high-sensitivity is 6 x2  -TSH and echocardiogram; echocardiogram is done and pending read -May consider head imaging but likely she had syncope in the setting of orthostatic vital signs given her dehydration -Check orthostatics and repeat them in the a.m. -Aggressive fluid resuscitation and she received 1 L from EMS and 2 L in the ED and will place on maintenance fluid at 75 MLS per hour -Clinically looks dehydrated in the setting of her nausea vomiting and diarrhea -Continue monitor studies and follow-up on the echocardiogram and TSH  -We'll get PT and OT to further evaluate  and treat  Acute anemia, unclear etiology at this point but unsure if she is having a slow GI bleeding -FOBT was negative x1 -Hemoglobin/hematocrit on admission is now 7.8/20.5 and is a marked drop from her last drawing last month where hemoglobin was 12.2/35.0 -Placed on IV PPI 40 mg every 12 and have 2 large-bore IVs  -continue IV fluid hydration X-type and screen and transfuse units of PRBCs if hemoglobin drops below 7 -Check anemia panel -Continue to monitor for signs and symptoms of bleeding; currently no overt bleeding noted -Have Consulted gastroenterology for further evaluation recommendations -Unlikely that the source of her bleeding is from her knee as her knee is not warm and erythematous and not swollen and not bruised appearing.  X-ray did not find any soft tissue changes  AKI Metabolic Acidosis, mild -Patient's BUNs/creatinine is now 13/2.06 and in the setting of dehydration as well as taking NSAIDs and her ACE and hydrochlorothiazide  -Her antihypertensive medications given that she is hypotensive on admission -Patient CO2 is 20, anion gap is 15, chloride level is 90 -IV fluid resuscitation as above-avoid nephrotoxic medications, contrast dyes, hypotension and and adjust medications-continue to monitor and trend renal function carefully and repeat CMP in a.m. -Patient has had poor oral intake so we'll give IV fluid hydration for now and put her on a clear liquid diet given that she may need an EGD   Hyponatremia/Hypochloremia -In the setting of dehydration from poor p.o. intake, nausea vomiting and diarrhea -IV fluid hydration as above -Continue monitor and trend and repeat CMP in a.m.  Hypokalemia -Patient potassium is 2.7 -Replete with p.o. potassium chloride 40 mEq as well as IV KCl 40 mEq  -Check magnesium level Plan continue to monitor and replete as necessary Repeat CMP in a.m.  Hyperglycemia -Patient blood sugar on admission was 106-check hemoglobin A1c in  a.m. -If necessary will place on sensitive  NovoLog type II insulin and will need to continue monitor blood sugars extremely carefully  Mildly elevated AST -in the setting of dehydration Continue monitor and trend and monitor hepatic function panel carefully and if necessary will obtain a right upper quadrant ultrasound as well as an acute hepatitis panel  Nausea, vomiting, diarrhea with poor p.o. intake -Questionable viral gastroenteritis  -SARS-CoV-2 testing was negative next-she does have a history of GERD which is pretty significant and states that she has had similar symptoms when her GERD starts acting up none-continue with IV PPI pantoprazole 40 g every 12 and have GI evaluate further -If not improving with supportive care measures with IV fluids and antiemetics with I p.o./V Zofran will obtain a CT of the abdomen and pelvis  Recent knee surgery with right knee lateral meniscus tear -She is status post knee arthroscopy with lateral meniscectomy and was given aspirin for DVT prophylaxis and recommended outpatient PT -X-ray of the right knee showed "New well corticated ossific density projecting over the posterior joint space in comparison to prior. This may reflect a loose body versus new osteophyte. Mild medial and patellofemoral compartment osteoarthritis" -Planing of very much knee pain but has been taking ibuprofen as needed for the pain  GERD/history of LA grade C erosive esophagitis and history of chronic gastritis -Continue with IV PPI  Hypocalcemia -Patient's calcium level was 7.7 -Replete with IV calcium gluconate 1 g X-continue monitor and replete as necessary -Repeat CMP in a.m.  Anxiety and Depression/Bipolar Disorder Type 1 -C/w Hydroxyzine 25 mg po Daily and Olanzapine 5 mg po qHS  ADDENDUM 1320: The ED provider called me and stated the patient became unresponsive not too long ago and her blood pressure in the 50s and was very difficult to arouse.  2 L of Fluid boluses  were given and she is also given Narcan 0.4 x2 as they found some oxycodone in her bag which we suspect that she may have taken.  After the 2 doses Narcan she started waking up and her blood pressure is a little bit better now but will transfer to stepdown unit given additional 1 L bolus.  The EDP stated that she was having nystagmus and will order an EEG to rule out seizure and a stat head CT given her unresponsiveness.  We will also obtain a UDS and repeat blood work right now.  And showed that she has an EF of 55 to 60% with left ventricular diastolic parameters being normal.  Also discussed the case with neurology however in the interim we will get the EEG and head CT first. Will Transfer to SDU Unit.   DVT prophylaxis: SCDs Code Status: FULL CODE Family Communication: No family present at bedside  Disposition Plan: Pending improvement back to baseline with GI workup Consults called: Gastroenterology  Admission status: Obs SDU  Severity of Illness: The appropriate patient status for this patient is OBSERVATION. Observation status is judged to be reasonable and necessary in order to provide the required intensity of service to ensure the patient's safety. The patient's presenting symptoms, physical exam findings, and initial radiographic and laboratory data in the context of their medical condition is felt to place them at decreased risk for further clinical deterioration. Furthermore, it is anticipated that the patient will be medically stable for discharge from the hospital within 2 midnights of admission. The following factors support the patient status of observation.   " The patient's presenting symptoms include Fatigue, Syncope, Nausea vomiting. " The physical exam findings  include Dry MM " The initial radiographic and laboratory data are concerning for AKI and ? GIB  Status is: Observation  The patient remains OBS appropriate and will d/c before 2 midnights.  Dispo: The patient is from:  Home              Anticipated d/c is to: TBD as PT/OT need to Evaluate and Treat              Anticipated d/c date is: 1 day              Patient currently is not medically stable to d/c.  Merlene Laughter, D.O. [facility] PAGER is on AMION  If 7PM-7AM, please contact night-coverage [email or web address]  10/10/2019, 12:28 PM

## 2019-10-10 NOTE — ED Provider Notes (Signed)
[facility]-EMERGENCY DEPT [facility] Emergency Department Provider Note MRN:  [id number]  Arrival date & time: 10/10/19     Chief Complaint   Hypotension   History of Present Illness   Kristin [name] is a 44 y.o. year-old female with a history of GERD presenting to the ED with chief complaint of hypotension.  Multiple syncopal episodes over the past few hours.  Underwent ACL repair surgery on July 19.  He seems to be recovering well.  Denies fever, no cough, no burning with urination.  Denies any chest pain or shortness of breath preceding or after the syncopal episodes.  Currently feels weak, mildly lightheaded.  Denies abdominal pain, no nausea or vomiting or diarrhea.  Review of Systems  A complete 10 system review of systems was obtained and all systems are negative except as noted in the HPI and PMH.   Patient's Health History    Past Medical History:  Diagnosis Date   Acid reflux    ACL tear 2011   not sure which side   Acute lateral meniscus tear of right knee    Anxiety    Arthritis    Back pain    slipped disc lower back lumbar   Depression    Eczema    Hypertension    Migraine headache    Neuromuscular disorder (HCC)    leg cramps    Past Surgical History:  Procedure Laterality Date   BIOPSY  09/19/2017   Procedure: BIOPSY;  Surgeon: Kathi Der, MD;  [facility];  Service: Gastroenterology;;   DILITATION & CURRETTAGE/HYSTROSCOPY WITH HYDROTHERMAL ABLATION N/A 04/04/2017   Procedure: DILATATION & CURETTAGE/HYSTEROSCOPY WITH HYDROTHERMAL ABLATION;  Surgeon: Reva Bores, MD;  Location: [facility];  Service: Gynecology;  Laterality: N/A;   ESOPHAGOGASTRODUODENOSCOPY (EGD) WITH PROPOFOL N/A 09/19/2017   Procedure: ESOPHAGOGASTRODUODENOSCOPY (EGD) WITH PROPOFOL;  Surgeon: Kathi Der, MD;  [facility]: [facility];  Service: Gastroenterology;  Laterality: N/A;   KNEE ARTHROSCOPY WITH LATERAL MENISECTOMY Right  09/30/2019   Procedure: KNEE ARTHROSCOPY WITH LATERAL MENISECTOMY;  Surgeon: Sheral Apley, MD;  Location: [facility];  Service: Orthopedics;  Laterality: Right;   TUBAL LIGATION  yrs ago    Family History  Problem Relation Age of Onset   Cancer Maternal Grandmother        Breast   Hypertension Mother    Hypertension Brother    Cancer Maternal Uncle        colon   Bipolar disorder Cousin    Schizophrenia Cousin     Social History   Socioeconomic History   Marital status: Single    Spouse name: Not on file   Number of children: 3   Years of education: Not on file   Highest education level: High school graduate  Occupational History   Not on file  Tobacco Use   Smoking status: Current Some Day Smoker    Packs/day: 0.25    Years: 5.00    Pack years: 1.25    Types: Cigarettes   Smokeless tobacco: Never Used  Building services engineer Use: Never used  Substance and Sexual Activity   Alcohol use: Yes    Comment: occassionally, longer than one week ago   Drug use: No   Sexual activity: Yes    Birth control/protection: Surgical  Other Topics Concern   Not on file  Social History Narrative   Not on file   Social Determinants of Health   Financial Resource Strain:  Difficulty of Paying Living Expenses:   Food Insecurity:    Worried About Programme researcher, broadcasting/film/video in the Last Year:    Barista in the Last Year:   Transportation Needs:    Freight forwarder (Medical):    Lack of Transportation (Non-Medical):   Physical Activity:    Days of Exercise per Week:    Minutes of Exercise per Session:   Stress:    Feeling of Stress :   Social Connections:    Frequency of Communication with Friends and Family:    Frequency of Social Gatherings with Friends and Family:    Attends Religious Services:    Active Member of Clubs or Organizations:    Attends Banker Meetings:    Marital Status:   Intimate  Partner Violence:    Fear of Current or Ex-Partner:    Emotionally Abused:    Physically Abused:    Sexually Abused:      Physical Exam   Vitals:   10/10/19 0641 10/10/19 0721  BP: (!) 95/62 117/76  Pulse: 97 99  Resp: 18 19  Temp: 98.3 F (36.8 C) 98.1 F (36.7 C)  SpO2: 100% 100%    CONSTITUTIONAL: Well-appearing, NAD NEURO:  Alert and oriented x 3, normal and symmetric strength and sensation, normal coordination, normal speech EYES:  eyes equal and reactive ENT/NECK:  no LAD, no JVD CARDIO: Regular rate, well-perfused, normal S1 and S2 PULM:  CTAB no wheezing or rhonchi GI/GU:  normal bowel sounds, non-distended, non-tender MSK/SPINE:  No gross deformities, no edema SKIN:  no rash, atraumatic PSYCH:  Appropriate speech and behavior  *Additional and/or pertinent findings included in MDM below  Diagnostic and Interventional Summary    EKG Interpretation  Date/Time:  Friday October 10 2019 07:19:23 EDT Ventricular Rate:  90 PR Interval:    QRS Duration: 88 QT Interval:  338 QTC Calculation: 414 R Axis:   73 Text Interpretation: Sinus rhythm Borderline T abnormalities, inferior leads Confirmed by Kennis Carina [phone]) on 10/10/2019 8:24:19 AM      Labs Reviewed  CBC WITH DIFFERENTIAL/PLATELET - Abnormal; Notable for the following components:      Result Value   RBC 2.56 (*)    Hemoglobin 7.8 (*)    HCT 23.5 (*)    nRBC 0.3 (*)    Monocytes Absolute 1.1 (*)    Abs Immature Granulocytes 0.11 (*)    All other components within normal limits  COMPREHENSIVE METABOLIC PANEL - Abnormal; Notable for the following components:   Sodium 125 (*)    Potassium 2.7 (*)    Chloride 90 (*)    CO2 20 (*)    Glucose, Bld 106 (*)    Creatinine, Ser 2.06 (*)    Calcium 7.7 (*)    Total Protein 6.2 (*)    Albumin 3.4 (*)    AST 49 (*)    Alkaline Phosphatase 133 (*)    GFR calc non Af Amer 28 (*)    GFR calc Af Amer 33 (*)    All other components within normal limits    URINALYSIS, ROUTINE W REFLEX MICROSCOPIC - Abnormal; Notable for the following components:   APPearance HAZY (*)    Hgb urine dipstick SMALL (*)    Protein, ur 30 (*)    Leukocytes,Ua MODERATE (*)    Bacteria, UA FEW (*)    All other components within normal limits  OCCULT BLOOD X 1 CARD TO LAB, STOOL  I-STAT BETA  HCG BLOOD, ED ([facility], [facility], [facility] ONLY)  TYPE AND SCREEN  TROPONIN I (HIGH SENSITIVITY)    No orders to display    Medications  potassium chloride SA (KLOR-CON) CR tablet 40 mEq (has no administration in time range)  oxyCODONE (Oxy IR/ROXICODONE) immediate release tablet 5 mg (has no administration in time range)  sodium chloride 0.9 % bolus 1,000 mL (1,000 mLs Intravenous New Bag/Given 10/10/19 0759)  sodium chloride 0.9 % bolus 1,000 mL (1,000 mLs Intravenous New Bag/Given 10/10/19 0757)     Procedures  /  Critical Care Procedures  ED Course and Medical Decision Making  I have reviewed the triage vital signs, the nursing notes, and pertinent available records from the EMR.  Listed above are laboratory and imaging tests that I personally ordered, reviewed, and interpreted and then considered in my medical decision making (see below for details).      Repeated syncopal episodes with hypotension soon after orthopedic surgery, suspect dehydration the patient states she has been drinking a lot of fluids.  There is enough concern for pulmonary embolism to pursue CT imaging today.  Labs reveal significantly down trended hemoglobin compared to level 1 month ago, also revealing hyponatremia, hypokalemia, AKI.  These values likely fully explain patient's syncopal episodes, and given the AKI will hold off on CT imaging at this time.  Unclear cause of anemia, patient denying blood in stool, history of heavy periods but not recently, surgery note documenting minimal blood loss.  Will admit to hospital service for further care.  Elmer Sow. Pilar Plate, MD [facility] Emergency Medicine [facility] [email or web address]  Final Clinical Impressions(s) / ED Diagnoses     ICD-10-CM   1. Dehydration  E86.0   2. AKI (acute kidney injury) (HCC)  N17.9   3. Anemia, unspecified type  D64.9     ED Discharge Orders    None       Discharge Instructions Discussed with and Provided to Patient:   Discharge Instructions   None       Sabas Sous, MD 10/10/19 [phone]

## 2019-10-10 NOTE — Progress Notes (Signed)
PT Cancellation Note  Patient Details Name: Kristin [name] MRN: [id number] DOB: 1975-08-15   Cancelled Treatment:    Reason Eval/Treat Not Completed: Medical issues which prohibited therapy;Patient not medically ready, low hgb, to get RBC's. Will check baci 7/31.   Rada Hay 10/10/2019, 3:43 PM Blanchard Kelch PT Acute Rehabilitation Services Pager [phone] Office [phone]

## 2019-10-10 NOTE — ED Notes (Signed)
EEG tech at bedside.  Patient refused to do EEG, stating she does not want to mess up her hair.

## 2019-10-10 NOTE — Consult Note (Addendum)
NAME:  LAWANDA HOLZHEIMER, MRN:  [id number], DOB:  Aug 29, 1975, LOS: 0 ADMISSION DATE:  10/10/2019, CONSULTATION DATE: 7/30 REFERRING MD: Dr. Marland Mcalpine CHIEF COMPLAINT: Hypotension  Brief History   44 year old female who presented to [facility] on 7/30 with reports of 3 months of vomiting, 2 weeks of diarrhea and passing out at home.  Patient reportedly had a syncopal episode in the emergency room and hypotension with systolic into the 50s.  History of present illness   44 year old female with past medical history of recent repair of an acute lateral meniscus tear the right knee who presented to [facility] ER on 7/30 with reports of approximately 3 months of vomiting which she attributes to acid reflux, 2 weeks of diarrhea and fatigue.  She also reports that she has been "passing out a bunch at home".  She denies shortness of breath, chest pain.  She reports that she has not had any blood in her emesis.  She has been taking ibuprofen and Norco for pain since her knee procedure on 7/20.  She indicates she has had poor p.o. intake in the last few weeks, and nausea.  She notes that she has not felt hungry and believes she has lost weight.  Initial ER evaluation included lab work notable for hyponatremia (NA 132), hypokalemia (K3.3), CO2 18, serum creatinine 1.66, calcium 6.3, magnesium 1.1, albumin 2.2, high-sensitivity troponin 6, WBC 7.2, hemoglobin 7.8, platelets 175.  The patient was treated with 4 L IV fluid with subsequent hemoglobin of 5.2 and platelets 114. Fecal occult blood testing was negative.  UDS positive for benzodiazepines and opiates (patient has been prescribed both).  Right knee x-ray showed a possible loose body versus new osteophyte.  Abdominal x-ray showed an unremarkable bowel gas pattern.  CT of the head was negative for acute intracranial hemorrhage, mass-effect or evidence of acute infarction.  Past Medical History  Migraines HTN Depression  Back Pain  Anxiety  Meniscus Tear -  09/2019  ACL Tear - 2011 GERD  Significant Hospital Events   7/30 Admit   Consults:  PCCM   Procedures:    Significant Diagnostic Tests:  7/30 UDS >> positive for benzodiazepines and opiates (patient has been prescribed both).   7/30 Right knee x-ray >> possible loose body versus new osteophyte.   7/30 Abdominal x-ray >> unremarkable bowel gas pattern.   7/30 CT of the head >> negative for acute intracranial hemorrhage, mass-effect or evidence of acute infarction.  Micro Data:  COVID 7/30 >> negative  BCx2 7/30 >>  GI PCR 7/30 >>  C-Diff 7/30 >>   Antimicrobials:     Interim history/subjective:  As above   Objective   Blood pressure (!) 93/64, pulse 88, temperature 98.1 F (36.7 C), temperature source Oral, resp. rate (!) 9, SpO2 100 %.        Intake/Output Summary (Last 24 hours) at 10/10/2019 1455 Last data filed at 10/10/2019 1434 Gross per 24 hour  Intake 577.16 ml  Output --  Net 577.16 ml   There were no vitals filed for this visit.  Examination: General: adult female lying in bed in NAD  HEENT: MM pink/moist, good dentition, anicteric  Neuro: appears sedate / drowsy, AAOx4, speech clear,  CV: s1s2 rrr, no m/r/g PULM: non-labored on RA, lungs bilaterally clear GI: soft, bsx4 active, no pain on palpation Extremities: warm/dry, no significant LE edema, right knee with band aid in place   Skin: no rashes or lesions  Resolved Hospital Problem list  Assessment & Plan:   Hypotension  Syncope  Suspect multifactorial in the setting of recent knee surgery, narcotics for pain, and volume depletion with N/V/D.  Note Hgb drop on most recent CBC to 5.2, ? Bleeding but no obvious source vs lab error and FOBT negative. On ASA.  Must also consider PE with recent immobility / knee surgery but she is not hypoxic, tachycardic or in respiratory distress.  Additionally, her high sensitivity troponin is negative.  CT head negative.  -continue IVF support -type and  cross -transfusion per primary  -assess D-Dimer, if positive will need CT chest but less worried about PE  -assess CXR  -assess EEG   Nausea / Vomiting / Diarrhea  -assess blood cultures -assess GI PCR, C-Diff  -PRN zofran  -GI consulted per primary   Anemia  Thrombocytopenia  Has been taking NSAIDS at home for pain -repeat STAT Hgb  -type and cross  -follow CBC -assess coags, LDH, Haptoglobin to review for hemolysis (although bilirubin not elevated) -SCD's for DVT, no anticoagulation   AKI  Hypokalemia  Hyponatremia  Hypomagnesemia  Note NSAID use -Trend BMP / urinary output -Replace electrolytes as indicated -Avoid nephrotoxic agents, ensure adequate renal perfusion  Best practice:  Diet: per primary  Pain/Anxiety/Delirium protocol (if indicated): per primary  VAP protocol (if indicated): per primary  DVT prophylaxis: SCD's GI prophylaxis: PPI Glucose control: n/a  Mobility: bed rest  Code Status: Full code  Family Communication: Patient updated on plan of care Disposition: SDU   Labs   7.2: Recent Labs  Lab 10/10/19 0712 10/10/19 1319  WBC 7.2 5.1  NEUTROABS 4.9 PENDING  HGB 7.8* 5.2*  HCT 23.5* 15.8*  MCV 91.8 94.6  PLT 175 114*    Basic Metabolic Panel: Recent Labs  Lab 10/10/19 0712 10/10/19 1319  NA 125* 132*  K 2.7* 3.3*  CL 90* 105  CO2 20* 18*  GLUCOSE 106* 99  BUN 13 11  CREATININE 2.06* 1.66*  CALCIUM 7.7* 6.3*  MG  --  1.1*  PHOS  --  3.2   GFR: Estimated Creatinine Clearance: 43.4 mL/min (A) (by C-G formula based on SCr of 1.66 mg/dL (H)). Recent Labs  Lab 10/10/19 0712 10/10/19 1319  WBC 7.2 5.1    Liver Function Tests: Recent Labs  Lab 10/10/19 0712 10/10/19 1319  AST 49* 30  ALT 13 9  ALKPHOS 133* 79  BILITOT 0.7 0.2*  PROT 6.2* 4.0*  ALBUMIN 3.4* 2.2*   No results for input(s): LIPASE, AMYLASE in the last 168 hours. No results for input(s): AMMONIA in the last 168 hours.  ABG    Component Value  Date/Time   TCO2 22 08/18/2017 1932     Coagulation Profile: No results for input(s): INR, PROTIME in the last 168 hours.  Cardiac Enzymes: No results for input(s): CKTOTAL, CKMB, CKMBINDEX, TROPONINI in the last 168 hours.  HbA1C: No results found for: HGBA1C  CBG: Recent Labs  Lab 10/10/19 1303  GLUCAP 132*    Review of Systems:   Gen: Denies fever, chills, weight change, fatigue, night sweats HEENT: Denies blurred vision, double vision, hearing loss, tinnitus, sinus congestion, rhinorrhea, sore throat, neck stiffness, dysphagia PULM: Denies shortness of breath, cough, sputum production, hemoptysis, wheezing CV: Denies chest pain, edema, orthopnea, paroxysmal nocturnal dyspnea, palpitations GI: Denies abdominal pain, nausea, vomiting, diarrhea, hematochezia, melena, constipation, change in bowel habits GU: Denies dysuria, hematuria, polyuria, oliguria, urethral discharge Endocrine: Denies hot or cold intolerance, polyuria, polyphagia or appetite change Derm:  Denies rash, dry skin, scaling or peeling skin change Heme: Denies easy bruising, bleeding, bleeding gums Neuro: Denies headache, numbness, weakness, slurred speech, loss of memory or consciousness, syncope ("passing out at home multiple times"  Past Medical History  She,  has a past medical history of Acid reflux, ACL tear (2011), Acute lateral meniscus tear of right knee, Anxiety, Arthritis, Back pain, Depression, Eczema, Hypertension, Migraine headache, and Neuromuscular disorder (HCC).   Surgical History    Past Surgical History:  Procedure Laterality Date  . BIOPSY  09/19/2017   Procedure: BIOPSY;  Surgeon: Kathi Der, MD;  [facility];  Service: Gastroenterology;;  . [name] & CURRETTAGE/HYSTROSCOPY WITH HYDROTHERMAL ABLATION N/A 04/04/2017   Procedure: DILATATION & CURETTAGE/HYSTEROSCOPY WITH HYDROTHERMAL ABLATION;  Surgeon: Reva Bores, MD;  Location: [facility];  Service:  Gynecology;  Laterality: N/A;  . ESOPHAGOGASTRODUODENOSCOPY (EGD) WITH PROPOFOL N/A 09/19/2017   Procedure: ESOPHAGOGASTRODUODENOSCOPY (EGD) WITH PROPOFOL;  Surgeon: Kathi Der, MD;  [facility];  Service: Gastroenterology;  Laterality: N/A;  . KNEE ARTHROSCOPY WITH LATERAL MENISECTOMY Right 09/30/2019   Procedure: KNEE ARTHROSCOPY WITH LATERAL MENISECTOMY;  Surgeon: Sheral Apley, MD;  Location: [facility];  Service: Orthopedics;  Laterality: Right;  . TUBAL LIGATION  yrs ago     Social History   reports that she has been smoking cigarettes. She has a 1.25 pack-year smoking history. She has never used smokeless tobacco. She reports current alcohol use. She reports that she does not use drugs.   Family History   Her family history includes Bipolar disorder in her cousin; Cancer in her maternal grandmother and maternal uncle; Hypertension in her brother and mother; Schizophrenia in her cousin.   Allergies Allergies  Allergen Reactions  . Sumatriptan Anaphylaxis    High blood pressure and numb  . Other     Bananas-stomach cramps  . Eggs Or Egg-Derived Products Rash and Other (See Comments)    Stomach cramps     Home Medications  Prior to Admission medications   Medication Sig Start Date End Date Taking? Authorizing Provider  acetaminophen (TYLENOL) 500 MG tablet Take 1 tablet (500 mg total) by mouth every 8 (eight) hours as needed for mild pain. 07/04/19  Yes Marthenia Rolling, DO  aspirin EC 81 MG tablet Take 1 tablet (81 mg total) by mouth 2 (two) times daily. For DVT prophylaxis for 30 days after surgery. 09/30/19  Yes McBane, Jerald Kief, PA-C  famotidine (PEPCID) 20 MG tablet TAKE 1 TABLET(20 MG) BY MOUTH TWICE DAILY Patient taking differently: Take 20 mg by mouth 2 (two) times daily.  10/07/19  Yes Derrel Nip, MD  gabapentin (NEURONTIN) 300 MG capsule Take 1 capsule (300 mg total) by mouth 3 (three) times daily. 06/25/19  Yes Hensel, Santiago Bumpers, MD    hydrOXYzine (VISTARIL) 25 MG capsule Take 1 capsule (25 mg total) by mouth daily as needed for anxiety. 07/04/19  Yes Bland, Scott, DO  ibuprofen (ADVIL) 600 MG tablet TAKE 1 TABLET(600 MG) BY MOUTH TWICE DAILY AS NEEDED Patient taking differently: Take 600 mg by mouth 2 (two) times daily as needed for fever or moderate pain.  09/29/19  Yes Derrel Nip, MD  lisinopril-hydrochlorothiazide (ZESTORETIC) 10-12.5 MG tablet Take 1 tablet by mouth daily. 07/04/19  Yes Bland, Scott, DO  magnesium oxide (MAG-OX) 400 MG tablet Take 1 tablet (400 mg total) by mouth daily. 09/03/19  Yes Renne Crigler, PA-C  OLANZapine (ZYPREXA) 5 MG tablet Take 1 tablet (5 mg total) by  mouth at bedtime. 09/23/19  Yes Derrel Nip, MD  omeprazole (PRILOSEC) 40 MG capsule Take 1 capsule (40 mg total) by mouth daily. 09/23/19  Yes Derrel Nip, MD  ondansetron (ZOFRAN) 4 MG tablet Take 1 tablet (4 mg total) by mouth every 8 (eight) hours as needed for nausea or vomiting. 09/30/19  Yes McBane, Jerald Kief, PA-C  tiZANidine (ZANAFLEX) 4 MG tablet Take 1-2 tablets (4-8 mg total) by mouth every 6 (six) hours as needed for muscle spasms. 07/04/19  Yes Marthenia Rolling, DO  cetirizine (ZYRTEC) 10 MG tablet Take 10 mg by mouth daily. Patient not taking: Reported on 10/10/2019 10/05/19   [provider]  clotrimazole (MYCELEX) 10 MG troche Take 1 tablet (10 mg total) by mouth 5 (five) times daily for 14 days. Patient not taking: Reported on 10/10/2019 10/05/19 10/19/19  Wieters, Junius Creamer, PA-C  HYDROcodone-acetaminophen (NORCO) 5-325 MG tablet Take 1-2 tablets by mouth every 6 (six) hours as needed for moderate pain. MAXIMUM TOTAL ACETAMINOPHEN DOSE IS 4000 MG PER DAY Patient not taking: Reported on 10/10/2019 09/30/19   Vernetta Honey, PA-C  metroNIDAZOLE (FLAGYL) 500 MG tablet Take 1 tablet (500 mg total) by mouth 2 (two) times daily. Patient not taking: Reported on 10/10/2019 09/23/19   Derrel Nip, MD  ondansetron (ZOFRAN  ODT) 4 MG disintegrating tablet Take 1 tablet (4 mg total) by mouth every 8 (eight) hours as needed for nausea or vomiting. Patient not taking: Reported on 10/10/2019 10/05/19   Wieters, Fran Lowes C, PA-C  potassium chloride SA (KLOR-CON) 20 MEQ tablet Take 1 tablet (20 mEq total) by mouth 2 (two) times daily. Patient not taking: Reported on 10/10/2019 09/03/19   Renne Crigler, PA-C  triamcinolone ointment (KENALOG) 0.1 % Apply 1 application topically 2 (two) times daily. Patient not taking: Reported on 10/10/2019 06/25/19   Moses Manners, MD  fluticasone [facility]) 50 MCG/ACT nasal spray Place 2 sprays into both nostrils daily. Patient taking differently: Place 2 sprays into both nostrils daily as needed for allergies.  02/25/17 04/14/19  Belinda Fisher, PA-C  promethazine (PHENERGAN) 25 MG tablet Take 1 tablet (25 mg total) by mouth every 6 (six) hours as needed for nausea or vomiting. 04/17/18 08/29/18  Jacalyn Lefevre, MD     Critical care time: 35 minutes     Canary Brim, MSN, NP-C [facility] 10/10/2019, 2:55 PM   Please see [email or web address] for pager details.

## 2019-10-10 NOTE — Consult Note (Addendum)
Referring Provider: Dr. Marguerita Merles Primary Care Physician:  System, Pcp Not In Primary Gastroenterologist:  Gentry Fitz   Reason for Consultation:  Anemia  HPI: Kristin [name] is a 43 y.o. female with history of esophagitis and recent ACL repair (09/29/19) presenting with anemia.  Patient presented to the ED last night after multiple episodes of hypotension and syncope.    She denies any abdominal pain but states she has recently felt nauseated and had several episodes of emesis over the past 3 days.  Emesis is nonbloody.  She has also had diarrhea for the past week, last stool occurring yesterday.  Denies any melena or hematochezia. Denies sick contacts. Is unsure if she had antibiotics related to knee surgery.  She reports frequent heartburn symptoms and takes Pepcid as an outpatient.  Occasionally has dysphagia to solids.  Reports some throat discomfort since her knee surgery, she thinks may have been related to intubation.  Has not felt hungry over the last week and thinks she has lost some weight as a result.  Has been taking ibuprofen post-operatively as needed for knee pain but states she has not been taking this every day.  Has taken aspirin 2x in the last week for pain. Denies anticoagulation use.  Denies any family history of colon cancer or gastrointestinal malignancies.  EGD 09/2017: LA Grade C erosive esophagitis (bx: Esophageal squamous mucosa with a two-tone appearance, negative for increased eosinophils), chronic gastritis (bx: Chronic gastritis, negative H. pylori), Normal duodenal bulb, first portion of the duodenum and second portion of the duodenum (bx: Chronic duodenitis consistent with peptic duodenitis).  Past Medical History:  Diagnosis Date  . Acid reflux   . ACL tear 2011   not sure which side  . Acute lateral meniscus tear of right knee   . Anxiety   . Arthritis   . Back pain    slipped disc lower back lumbar  . Depression   . Eczema   . Hypertension   .  Migraine headache   . Neuromuscular disorder (HCC)    leg cramps    Past Surgical History:  Procedure Laterality Date  . BIOPSY  09/19/2017   Procedure: BIOPSY;  Surgeon: Kathi Der, MD;  Location: [facility];  Service: Gastroenterology;;  . [name] & CURRETTAGE/HYSTROSCOPY WITH HYDROTHERMAL ABLATION N/A 04/04/2017   Procedure: DILATATION & CURETTAGE/HYSTEROSCOPY WITH HYDROTHERMAL ABLATION;  Surgeon: Reva Bores, MD;  Location: [facility];  Service: Gynecology;  Laterality: N/A;  . ESOPHAGOGASTRODUODENOSCOPY (EGD) WITH PROPOFOL N/A 09/19/2017   Procedure: ESOPHAGOGASTRODUODENOSCOPY (EGD) WITH PROPOFOL;  Surgeon: Kathi Der, MD;  Location: [facility];  Service: Gastroenterology;  Laterality: N/A;  . KNEE ARTHROSCOPY WITH LATERAL MENISECTOMY Right 09/30/2019   Procedure: KNEE ARTHROSCOPY WITH LATERAL MENISECTOMY;  Surgeon: Sheral Apley, MD;  Location: [facility];  Service: Orthopedics;  Laterality: Right;  . TUBAL LIGATION  yrs ago    Prior to Admission medications   Medication Sig Start Date End Date Taking? Authorizing Provider  acetaminophen (TYLENOL) 500 MG tablet Take 1 tablet (500 mg total) by mouth every 8 (eight) hours as needed for mild pain. 07/04/19  Yes Marthenia Rolling, DO  aspirin EC 81 MG tablet Take 1 tablet (81 mg total) by mouth 2 (two) times daily. For DVT prophylaxis for 30 days after surgery. 09/30/19  Yes McBane, Jerald Kief, PA-C  famotidine (PEPCID) 20 MG tablet TAKE 1 TABLET(20 MG) BY MOUTH TWICE DAILY Patient taking differently: Take 20 mg by mouth 2 (two) times daily.  10/07/19  Yes Derrel Nip, MD  gabapentin (NEURONTIN) 300 MG capsule Take 1 capsule (300 mg total) by mouth 3 (three) times daily. 06/25/19  Yes Hensel, Santiago Bumpers, MD  hydrOXYzine (VISTARIL) 25 MG capsule Take 1 capsule (25 mg total) by mouth daily as needed for anxiety. 07/04/19  Yes Bland, Scott, DO  ibuprofen (ADVIL) 600 MG tablet TAKE 1 TABLET(600  MG) BY MOUTH TWICE DAILY AS NEEDED Patient taking differently: Take 600 mg by mouth 2 (two) times daily as needed for fever or moderate pain.  09/29/19  Yes Derrel Nip, MD  lisinopril-hydrochlorothiazide (ZESTORETIC) 10-12.5 MG tablet Take 1 tablet by mouth daily. 07/04/19  Yes Bland, Scott, DO  magnesium oxide (MAG-OX) 400 MG tablet Take 1 tablet (400 mg total) by mouth daily. 09/03/19  Yes Renne Crigler, PA-C  OLANZapine (ZYPREXA) 5 MG tablet Take 1 tablet (5 mg total) by mouth at bedtime. 09/23/19  Yes Derrel Nip, MD  omeprazole (PRILOSEC) 40 MG capsule Take 1 capsule (40 mg total) by mouth daily. 09/23/19  Yes Derrel Nip, MD  ondansetron (ZOFRAN) 4 MG tablet Take 1 tablet (4 mg total) by mouth every 8 (eight) hours as needed for nausea or vomiting. 09/30/19  Yes McBane, Jerald Kief, PA-C  tiZANidine (ZANAFLEX) 4 MG tablet Take 1-2 tablets (4-8 mg total) by mouth every 6 (six) hours as needed for muscle spasms. 07/04/19  Yes Marthenia Rolling, DO  cetirizine (ZYRTEC) 10 MG tablet Take 10 mg by mouth daily. Patient not taking: Reported on 10/10/2019 10/05/19   [provider]  clotrimazole (MYCELEX) 10 MG troche Take 1 tablet (10 mg total) by mouth 5 (five) times daily for 14 days. Patient not taking: Reported on 10/10/2019 10/05/19 10/19/19  Wieters, Junius Creamer, PA-C  HYDROcodone-acetaminophen (NORCO) 5-325 MG tablet Take 1-2 tablets by mouth every 6 (six) hours as needed for moderate pain. MAXIMUM TOTAL ACETAMINOPHEN DOSE IS 4000 MG PER DAY Patient not taking: Reported on 10/10/2019 09/30/19   Vernetta Honey, PA-C  metroNIDAZOLE (FLAGYL) 500 MG tablet Take 1 tablet (500 mg total) by mouth 2 (two) times daily. Patient not taking: Reported on 10/10/2019 09/23/19   Derrel Nip, MD  ondansetron (ZOFRAN ODT) 4 MG disintegrating tablet Take 1 tablet (4 mg total) by mouth every 8 (eight) hours as needed for nausea or vomiting. Patient not taking: Reported on 10/10/2019 10/05/19   Wieters,  Fran Lowes C, PA-C  potassium chloride SA (KLOR-CON) 20 MEQ tablet Take 1 tablet (20 mEq total) by mouth 2 (two) times daily. Patient not taking: Reported on 10/10/2019 09/03/19   Renne Crigler, PA-C  triamcinolone ointment (KENALOG) 0.1 % Apply 1 application topically 2 (two) times daily. Patient not taking: Reported on 10/10/2019 06/25/19   Moses Manners, MD  fluticasone [facility]) 50 MCG/ACT nasal spray Place 2 sprays into both nostrils daily. Patient taking differently: Place 2 sprays into both nostrils daily as needed for allergies.  02/25/17 04/14/19  Belinda Fisher, PA-C  promethazine (PHENERGAN) 25 MG tablet Take 1 tablet (25 mg total) by mouth every 6 (six) hours as needed for nausea or vomiting. 04/17/18 08/29/18  Jacalyn Lefevre, MD    Scheduled Meds: Continuous Infusions: . calcium gluconate    . potassium chloride     PRN Meds:.  Allergies as of 10/10/2019 - Review Complete 10/10/2019  Allergen Reaction Noted  . Sumatriptan Anaphylaxis   . Other  09/30/2019  . Eggs or egg-derived products Rash and Other (See Comments) 09/30/2019    Family History  Problem Relation Age of Onset  . Cancer Maternal Grandmother        Breast  . Hypertension Mother   . Hypertension Brother   . Cancer Maternal Uncle        colon  . Bipolar disorder Cousin   . Schizophrenia Cousin     Social History   Socioeconomic History  . Marital status: Single    Spouse name: Not on file  . Number of children: 3  . Years of education: Not on file  . Highest education level: High school graduate  Occupational History  . Not on file  Tobacco Use  . Smoking status: Current Some Day Smoker    Packs/day: 0.25    Years: 5.00    Pack years: 1.25    Types: Cigarettes  . Smokeless tobacco: Never Used  Vaping Use  . Vaping Use: Never used  Substance and Sexual Activity  . Alcohol use: Yes    Comment: occassionally, longer than one week ago  . Drug use: No  . Sexual activity: Yes    Birth  control/protection: Surgical  Other Topics Concern  . Not on file  Social History Narrative  . Not on file   Social Determinants of Health   Financial Resource Strain:   . Difficulty of Paying Living Expenses:   Food Insecurity:   . Worried About Programme researcher, broadcasting/film/video in the Last Year:   . Barista in the Last Year:   Transportation Needs:   . Freight forwarder (Medical):   Marland Kitchen Lack of Transportation (Non-Medical):   Physical Activity:   . Days of Exercise per Week:   . Minutes of Exercise per Session:   Stress:   . Feeling of Stress :   Social Connections:   . Frequency of Communication with Friends and Family:   . Frequency of Social Gatherings with Friends and Family:   . Attends Religious Services:   . Active Member of Clubs or Organizations:   . Attends Banker Meetings:   Marland Kitchen Marital Status:   Intimate Partner Violence:   . Fear of Current or Ex-Partner:   . Emotionally Abused:   Marland Kitchen Physically Abused:   . Sexually Abused:     Review of Systems: Review of Systems  Constitutional: Negative for chills and fever.  HENT: Negative for hearing loss and tinnitus.   Eyes: Negative for pain and redness.  Respiratory: Negative for cough and sputum production.   Cardiovascular: Negative for chest pain and palpitations.  Gastrointestinal: Positive for diarrhea, heartburn, nausea and vomiting. Negative for abdominal pain, blood in stool, constipation and melena.  Genitourinary: Negative for flank pain and hematuria.  Musculoskeletal: Negative for back pain.  Skin: Negative for itching and rash.  Neurological: Positive for dizziness and loss of consciousness.  Endo/Heme/Allergies: Negative for polydipsia. Does not bruise/bleed easily.  Psychiatric/Behavioral: Negative for depression. The patient is not nervous/anxious.     Physical Exam: Vital signs: Vitals:   10/10/19 0830 10/10/19 0900  BP: (!) 111/94 116/81  Pulse: 98 98  Resp: 21 16  Temp:     SpO2: 100% 100%     Physical Exam Constitutional:      General: She is not in acute distress.    Appearance: Normal appearance.  HENT:     Head: Normocephalic and atraumatic.     Mouth/Throat:     Mouth: Mucous membranes are moist.     Pharynx: Oropharynx is clear.  Eyes:  Extraocular Movements: Extraocular movements intact.     Comments: Conjunctival pallor  Cardiovascular:     Rate and Rhythm: Normal rate and regular rhythm.     Pulses: Normal pulses.     Heart sounds: Normal heart sounds.  Pulmonary:     Effort: Pulmonary effort is normal. No respiratory distress.     Breath sounds: Normal breath sounds.  Abdominal:     General: Bowel sounds are normal. There is no distension.     Palpations: Abdomen is soft. There is no mass.     Tenderness: There is abdominal tenderness (mild, diffuse). There is no guarding or rebound.     Hernia: No hernia is present.  Musculoskeletal:     Cervical back: Normal range of motion and neck supple.     Right lower leg: No edema.     Left lower leg: No edema.  Skin:    General: Skin is warm and dry.  Neurological:     General: No focal deficit present.     Mental Status: She is alert and oriented to person, place, and time.  Psychiatric:        Mood and Affect: Mood normal.        Behavior: Behavior normal.     GI:  Lab Results: Recent Labs    10/10/19 0712  WBC 7.2  HGB 7.8*  HCT 23.5*  PLT 175   BMET Recent Labs    10/10/19 0712  NA 125*  K 2.7*  CL 90*  CO2 20*  GLUCOSE 106*  BUN 13  CREATININE 2.06*  CALCIUM 7.7*   LFT Recent Labs    10/10/19 0712  PROT 6.2*  ALBUMIN 3.4*  AST 49*  ALT 13  ALKPHOS 133*  BILITOT 0.7   PT/INR No results for input(s): LABPROT, INR in the last 72 hours.   Studies/Results: DG Knee 1-2 Views Right  Result Date: 10/10/2019 CLINICAL DATA:  Pain.  Recent knee arthroscopy. EXAM: RIGHT KNEE - 1-2 VIEW COMPARISON:  January 12, 2019 FINDINGS: No acute fracture or  dislocation. Mild degenerative changes of the medial and patellofemoral compartments. Enthesiophyte at the quadriceps tendon insertion on the patella. There is a new well corticated ossific density projecting over the posterior joint space in comparison to prior. This may reflect a loose body versus new osteophyte. No area of erosion or osseous destruction. No unexpected radiopaque foreign body. Soft tissues are unremarkable. IMPRESSION: 1. New well corticated ossific density projecting over the posterior joint space in comparison to prior. This may reflect a loose body versus new osteophyte. 2. Mild medial and patellofemoral compartment osteoarthritis. Electronically Signed   By: Meda Klinefelter MD   On: 10/10/2019 09:17    Impression: Anemia: FOBT negative 7/30 with no signs of GI bleeding -Hgb 7.8, decreased from baseline of 12.1 on 09/02/19 -BUN normal (13) with Cr 2.06, not suggestive of lower GI bleeding  Nausea, vomiting, and diarrhea -Hypokalemia: K+ 2.7 -Hyponatremia and hypokalemia    Plan: Nausea, vomiting, and diarrhea could be related to gastroenteritis, thus recommend GI pathogen panel and C. Diff.  Will order plain films of abdomen to rule out ileus, though diarrhea makes this less likely.  Patient has mildly elevated AST and ALP and mild diffuse abdominal tenderness.  In the setting of nausea/vomiting, recommend RUQ [place].  No signs of GI bleeding and FOBT negative, thus we will defere EGD/colonoscopy at this time.  Recommend Protonix 40 mg IV BID for GERD and history of esophagitis and gastritis.  Continue supportive care with anti-emetics as needed.  Continue to monitor H&H with transfusion as needed to maintain Hgb >7.   LOS: 0 days   Edrick Kins  PA-C 10/10/2019, 10:24 AM  Contact #  [phone]

## 2019-10-10 NOTE — ED Notes (Signed)
CRITICAL VALUE ALERT  Critical Value:  Calcium 6.3 Hemoglobin 5.2  Date & Time Notied:  10/10/2019 1445  Provider Notified: Pilar Plate, EDP  Orders Received/Actions taken: no new orders

## 2019-10-10 NOTE — ED Notes (Signed)
This nurse in patient's room to hang another bag of potassium.  Patient minimally responsive to sternal rub, very diaphoretic, and BP 70s/40s.  Bero, EDP to bedside to assess patient.  Narcan X2 given IV and patient became more awake.  Patient has home pain meds are beside in purse and there is concern that patient possibly took some.  Medications taken to pharmacy to lock up until patient discharges.

## 2019-10-10 NOTE — ED Notes (Signed)
US at bedside

## 2019-10-10 NOTE — Progress Notes (Signed)
  Echocardiogram 2D Echocardiogram has been performed.  Kristin [name] 10/10/2019, 12:10 PM

## 2019-10-10 NOTE — Progress Notes (Signed)
Tech came over to [facility] attempted EEG today. Pt refused. Pt stated her hair is done and cannot be undone for the test. Let RN know

## 2019-10-10 NOTE — ED Triage Notes (Signed)
Pt from home with c/o dizziness and syncope bp checked by EMS 58/40 IV site established and 1 liter 0.9nss started  Repeat BP 97/57 pt more alert is A&O x3

## 2019-10-11 ENCOUNTER — Observation Stay (HOSPITAL_COMMUNITY): Payer: 59

## 2019-10-11 DIAGNOSIS — Z7982 Long term (current) use of aspirin: Secondary | ICD-10-CM | POA: Diagnosis not present

## 2019-10-11 DIAGNOSIS — Z791 Long term (current) use of non-steroidal anti-inflammatories (NSAID): Secondary | ICD-10-CM | POA: Diagnosis not present

## 2019-10-11 DIAGNOSIS — Z803 Family history of malignant neoplasm of breast: Secondary | ICD-10-CM | POA: Diagnosis not present

## 2019-10-11 DIAGNOSIS — F418 Other specified anxiety disorders: Secondary | ICD-10-CM | POA: Diagnosis present

## 2019-10-11 DIAGNOSIS — K529 Noninfective gastroenteritis and colitis, unspecified: Secondary | ICD-10-CM

## 2019-10-11 DIAGNOSIS — N179 Acute kidney failure, unspecified: Secondary | ICD-10-CM | POA: Diagnosis not present

## 2019-10-11 DIAGNOSIS — D649 Anemia, unspecified: Secondary | ICD-10-CM | POA: Diagnosis not present

## 2019-10-11 DIAGNOSIS — Z79899 Other long term (current) drug therapy: Secondary | ICD-10-CM | POA: Diagnosis not present

## 2019-10-11 DIAGNOSIS — R1310 Dysphagia, unspecified: Secondary | ICD-10-CM | POA: Diagnosis present

## 2019-10-11 DIAGNOSIS — E871 Hypo-osmolality and hyponatremia: Secondary | ICD-10-CM | POA: Diagnosis not present

## 2019-10-11 DIAGNOSIS — D62 Acute posthemorrhagic anemia: Secondary | ICD-10-CM | POA: Diagnosis not present

## 2019-10-11 DIAGNOSIS — Z800 Family history of malignant neoplasm of digestive organs: Secondary | ICD-10-CM | POA: Diagnosis not present

## 2019-10-11 DIAGNOSIS — Z818 Family history of other mental and behavioral disorders: Secondary | ICD-10-CM | POA: Diagnosis not present

## 2019-10-11 DIAGNOSIS — Z8249 Family history of ischemic heart disease and other diseases of the circulatory system: Secondary | ICD-10-CM | POA: Diagnosis not present

## 2019-10-11 DIAGNOSIS — E860 Dehydration: Secondary | ICD-10-CM | POA: Diagnosis present

## 2019-10-11 DIAGNOSIS — F17210 Nicotine dependence, cigarettes, uncomplicated: Secondary | ICD-10-CM | POA: Diagnosis present

## 2019-10-11 DIAGNOSIS — F3170 Bipolar disorder, currently in remission, most recent episode unspecified: Secondary | ICD-10-CM | POA: Diagnosis not present

## 2019-10-11 DIAGNOSIS — I10 Essential (primary) hypertension: Secondary | ICD-10-CM | POA: Diagnosis present

## 2019-10-11 DIAGNOSIS — K2900 Acute gastritis without bleeding: Secondary | ICD-10-CM | POA: Diagnosis present

## 2019-10-11 DIAGNOSIS — I959 Hypotension, unspecified: Secondary | ICD-10-CM | POA: Diagnosis not present

## 2019-10-11 DIAGNOSIS — R578 Other shock: Secondary | ICD-10-CM | POA: Diagnosis not present

## 2019-10-11 DIAGNOSIS — F419 Anxiety disorder, unspecified: Secondary | ICD-10-CM | POA: Diagnosis not present

## 2019-10-11 DIAGNOSIS — Z79891 Long term (current) use of opiate analgesic: Secondary | ICD-10-CM | POA: Diagnosis not present

## 2019-10-11 DIAGNOSIS — K2100 Gastro-esophageal reflux disease with esophagitis, without bleeding: Secondary | ICD-10-CM | POA: Diagnosis not present

## 2019-10-11 DIAGNOSIS — R945 Abnormal results of liver function studies: Secondary | ICD-10-CM | POA: Diagnosis not present

## 2019-10-11 DIAGNOSIS — F1010 Alcohol abuse, uncomplicated: Secondary | ICD-10-CM | POA: Diagnosis present

## 2019-10-11 DIAGNOSIS — R55 Syncope and collapse: Secondary | ICD-10-CM | POA: Diagnosis not present

## 2019-10-11 DIAGNOSIS — R634 Abnormal weight loss: Secondary | ICD-10-CM | POA: Diagnosis present

## 2019-10-11 DIAGNOSIS — Z20822 Contact with and (suspected) exposure to covid-19: Secondary | ICD-10-CM | POA: Diagnosis not present

## 2019-10-11 LAB — CBC WITH DIFFERENTIAL/PLATELET
Abs Immature Granulocytes: 0.08 10*3/uL — ABNORMAL HIGH (ref 0.00–0.07)
Basophils Absolute: 0 10*3/uL (ref 0.0–0.1)
Basophils Relative: 1 %
Eosinophils Absolute: 0.2 10*3/uL (ref 0.0–0.5)
Eosinophils Relative: 3 %
HCT: 28 % — ABNORMAL LOW (ref 36.0–46.0)
Hemoglobin: 9.2 g/dL — ABNORMAL LOW (ref 12.0–15.0)
Immature Granulocytes: 1 %
Lymphocytes Relative: 13 %
Lymphs Abs: 0.8 10*3/uL (ref 0.7–4.0)
MCH: 30.7 pg (ref 26.0–34.0)
MCHC: 32.9 g/dL (ref 30.0–36.0)
MCV: 93.3 fL (ref 80.0–100.0)
Monocytes Absolute: 1 10*3/uL (ref 0.1–1.0)
Monocytes Relative: 17 %
Neutro Abs: 4.1 10*3/uL (ref 1.7–7.7)
Neutrophils Relative %: 65 %
Platelets: 132 10*3/uL — ABNORMAL LOW (ref 150–400)
RBC: 3 MIL/uL — ABNORMAL LOW (ref 3.87–5.11)
RDW: 14.6 % (ref 11.5–15.5)
WBC: 6.2 10*3/uL (ref 4.0–10.5)
nRBC: 0 % (ref 0.0–0.2)

## 2019-10-11 LAB — RETICULOCYTES
Immature Retic Fract: 0 % — ABNORMAL LOW (ref 2.3–15.9)
RBC.: 3.04 MIL/uL — ABNORMAL LOW (ref 3.87–5.11)
Retic Count, Absolute: 23.4 10*3/uL (ref 19.0–186.0)
Retic Ct Pct: 0.8 % (ref 0.4–3.1)

## 2019-10-11 LAB — PHOSPHORUS: Phosphorus: 2.9 mg/dL (ref 2.5–4.6)

## 2019-10-11 LAB — COMPREHENSIVE METABOLIC PANEL
ALT: 12 U/L (ref 0–44)
AST: 41 U/L (ref 15–41)
Albumin: 2.9 g/dL — ABNORMAL LOW (ref 3.5–5.0)
Alkaline Phosphatase: 106 U/L (ref 38–126)
Anion gap: 11 (ref 5–15)
BUN: 9 mg/dL (ref 6–20)
CO2: 14 mmol/L — ABNORMAL LOW (ref 22–32)
Calcium: 7.3 mg/dL — ABNORMAL LOW (ref 8.9–10.3)
Chloride: 109 mmol/L (ref 98–111)
Creatinine, Ser: 0.88 mg/dL (ref 0.44–1.00)
GFR calc Af Amer: >60 mL/min (ref >60)
GFR calc non Af Amer: >60 mL/min (ref >60)
Glucose, Bld: 105 mg/dL — ABNORMAL HIGH (ref 70–99)
Potassium: 3.9 mmol/L (ref 3.5–5.1)
Sodium: 134 mmol/L — ABNORMAL LOW (ref 135–145)
Total Bilirubin: 0.5 mg/dL (ref 0.3–1.2)
Total Protein: 5.3 g/dL — ABNORMAL LOW (ref 6.5–8.1)

## 2019-10-11 LAB — C DIFFICILE QUICK SCREEN W PCR REFLEX
C Diff antigen: NEGATIVE
C Diff interpretation: NOT DETECTED
C Diff toxin: NEGATIVE

## 2019-10-11 LAB — MAGNESIUM: Magnesium: 1.9 mg/dL (ref 1.7–2.4)

## 2019-10-11 LAB — HEMOGLOBIN A1C
Hgb A1c MFr Bld: 5.8 % — ABNORMAL HIGH (ref 4.8–5.6)
Mean Plasma Glucose: 119.76 mg/dL

## 2019-10-11 LAB — GLUCOSE, CAPILLARY: Glucose-Capillary: 117 mg/dL — ABNORMAL HIGH (ref 70–99)

## 2019-10-11 LAB — HAPTOGLOBIN: Haptoglobin: 124 mg/dL (ref 42–296)

## 2019-10-11 LAB — TSH: TSH: 0.374 u[IU]/mL (ref 0.350–4.500)

## 2019-10-11 LAB — MRSA PCR SCREENING: MRSA by PCR: NEGATIVE

## 2019-10-11 MED ORDER — CALCIUM GLUCONATE-NACL 1-0.675 GM/50ML-% IV SOLN
1.0000 g | Freq: Once | INTRAVENOUS | Status: AC
  Administered 2019-10-11: 1000 mg via INTRAVENOUS
  Filled 2019-10-11: qty 50

## 2019-10-11 MED ORDER — THIAMINE HCL 100 MG/ML IJ SOLN: 100.0000 mg | Freq: Every day | INTRAMUSCULAR | Status: DC

## 2019-10-11 MED ORDER — SODIUM BICARBONATE-DEXTROSE 150-5 MEQ/L-% IV SOLN: 150.0000 meq | INTRAVENOUS | Status: DC

## 2019-10-11 MED ORDER — IOHEXOL 9 MG/ML PO SOLN
ORAL | Status: AC
  Filled 2019-10-11: qty 1000

## 2019-10-11 MED ORDER — SODIUM CHLORIDE 0.9 % IV SOLN
2.0000 g | INTRAVENOUS | Status: DC
  Administered 2019-10-13 – 2019-10-14 (×2): 2 g via INTRAVENOUS
  Filled 2019-10-11: qty 20
  Filled 2019-10-11: qty 2
  Filled 2019-10-11 (×3): qty 20
  Filled 2019-10-11: qty 2

## 2019-10-11 MED ORDER — THIAMINE HCL 100 MG PO TABS
100.0000 mg | ORAL_TABLET | Freq: Every day | ORAL | Status: DC
  Administered 2019-10-11 – 2019-10-14 (×4): 100 mg via ORAL
  Filled 2019-10-11 (×4): qty 1

## 2019-10-11 MED ORDER — IOHEXOL 350 MG/ML SOLN
100.0000 mL | Freq: Once | INTRAVENOUS | Status: AC | PRN
  Administered 2019-10-11: 100 mL via INTRAVENOUS

## 2019-10-11 MED ORDER — FOLIC ACID 1 MG PO TABS
1.0000 mg | ORAL_TABLET | Freq: Every day | ORAL | Status: DC
  Administered 2019-10-11 – 2019-10-14 (×4): 1 mg via ORAL
  Filled 2019-10-11 (×4): qty 1

## 2019-10-11 MED ORDER — ADULT MULTIVITAMIN W/MINERALS CH
1.0000 | ORAL_TABLET | Freq: Every day | ORAL | Status: DC
  Administered 2019-10-11 – 2019-10-14 (×4): 1 via ORAL
  Filled 2019-10-11 (×4): qty 1

## 2019-10-11 MED ORDER — SODIUM CHLORIDE (PF) 0.9 % IJ SOLN
INTRAMUSCULAR | Status: AC
  Filled 2019-10-11: qty 50

## 2019-10-11 MED ORDER — LORAZEPAM 1 MG PO TABS: 1.0000 mg | ORAL_TABLET | ORAL | Status: AC | PRN

## 2019-10-11 MED ORDER — SODIUM BICARBONATE 8.4 % IV SOLN
INTRAVENOUS | Status: DC
  Filled 2019-10-11 (×9): qty 150

## 2019-10-11 MED ORDER — METRONIDAZOLE IN NACL 5-0.79 MG/ML-% IV SOLN
500.0000 mg | Freq: Three times a day (TID) | INTRAVENOUS | Status: DC
  Administered 2019-10-11 – 2019-10-15 (×10): 500 mg via INTRAVENOUS
  Filled 2019-10-11 (×11): qty 100

## 2019-10-11 MED ORDER — LORAZEPAM 2 MG/ML IJ SOLN: 1.0000 mg | INTRAMUSCULAR | Status: AC | PRN

## 2019-10-11 NOTE — Progress Notes (Signed)
Received pt from ICU/SD instable condition, Enteric Precaution, Tele, Fall Precautions. Pt VSs stable. Oriented to room and setting. Will cont to monitor. SRP, RN

## 2019-10-11 NOTE — Progress Notes (Signed)
OT Cancellation Note  Patient Details Name: Kristin [name] MRN: [id number] DOB: February 24, 1976   Cancelled Treatment:    Reason Eval/Treat Not Completed: OT screened, no needs identified, will sign off Patient reports no deficits with ADLs and has been ambulating in room without assistance.   Aarron Wierzbicki L Brileigh Sevcik 10/11/2019, 8:26 AM

## 2019-10-11 NOTE — Progress Notes (Signed)
NAME:  Kristin [name], MRN:  [id number], DOB:  02-17-1976, LOS: 0 ADMISSION DATE:  10/10/2019, CONSULTATION DATE: 7/30 REFERRING MD: Dr. Marland Mcalpine CHIEF COMPLAINT: Hypotension  Brief History   44 year old female who presented to [facility] ER on 7/30 with reports of 3 months of vomiting, 2 weeks of diarrhea and passing out at home.  Patient reportedly had a syncopal episode in the emergency room and hypotension with systolic into the 50s. Noted to have hemoglobin of 7.8 >> 5.2  , last hemoglobin noted to be 12.1 on 6/22  History of present illness   44 year old female with past medical history of recent repair of an acute lateral meniscus tear the right knee who presented to [facility] ER on 7/30 with reports of approximately 3 months of vomiting which she attributes to acid reflux, 2 weeks of diarrhea and fatigue.  She also reports that she has been "passing out a bunch at home".  She denies shortness of breath, chest pain.  She reports that she has not had any blood in her emesis.  She has been taking ibuprofen and Norco for pain since her knee procedure on 7/20.  She indicates she has had poor p.o. intake in the last few weeks, and nausea.  She notes that she has not felt hungry and believes she has lost weight.  Initial ER evaluation included lab work notable for hyponatremia (NA 132), hypokalemia (K3.3), CO2 18, serum creatinine 1.66, calcium 6.3, magnesium 1.1, albumin 2.2, high-sensitivity troponin 6, WBC 7.2, hemoglobin 7.8, platelets 175.  The patient was treated with 4 L IV fluid with subsequent hemoglobin of 5.2 and platelets 114. Fecal occult blood testing was negative.  UDS positive for benzodiazepines and opiates (patient has been prescribed both).  Right knee x-ray showed a possible loose body versus new osteophyte.  Abdominal x-ray showed an unremarkable bowel gas pattern.  CT of the head was negative   Past Medical History  Migraines HTN Depression  Back Pain  Anxiety  Meniscus  Tear - 09/2019  ACL Tear - 2011 GERD  Significant Hospital Events   7/30 Admit   Consults:  PCCM   Procedures:    Significant Diagnostic Tests:  7/30 UDS >> positive for benzodiazepines and opiates (patient has been prescribed both).   7/30 Right knee x-ray >> possible loose body versus new osteophyte.   7/30 Abdominal x-ray >> unremarkable bowel gas pattern.   7/30 CT of the head >> negative for acute intracranial hemorrhage, mass-effect or evidence of acute infarction.  Micro Data:  COVID 7/30 >> negative  BCx2 7/30 >>  GI PCR 7/30 >>  C-Diff 7/30 >>   Antimicrobials:     Interim history/subjective:   Denies history of black stools Afebrile no further episodes of syncope  Objective   Blood pressure 123/79, pulse (!) 117, temperature 98.5 F (36.9 C), temperature source Oral, resp. rate 17, weight 85.7 kg, SpO2 100 %.        Intake/Output Summary (Last 24 hours) at 10/11/2019 1013 Last data filed at 10/11/2019 1000 Gross per 24 hour  Intake 2341.83 ml  Output --  Net 2341.83 ml   Filed Weights   10/11/19 0500  Weight: 85.7 kg    Examination: General: adult female lying in bed in NAD  HEENT: MM pink/moist, good dentition, anicteric  Neuro:, AAOx4, speech clear,  CV: s1s2 rrr, no m/r/g PULM: No accessory muscle use, lungs bilaterally clear GI: soft, bsx4 active, no pain on palpation Extremities: warm/dry, no significant  LE edema, right knee with band aid in place   Skin: no rashes or lesions  Labs show improved hemoglobin from 5.2 >> 9.2, mild hyponatremia  Resolved Hospital Problem list    AKI -resolved likely related to hypovolemia  Assessment & Plan:   Hypotension  Syncope  Due to severe anemia, note Hgb drop on most recent CBC to 5.2,  On ASA. Doubt PE with recent immobility / knee surgery but she is not hypoxic, tachycardic or in respiratory distress.  Additionally, her high sensitivity troponin is negative.  No evidence of RV dysfunction on  x-ray.  CT head negative.  -Telemetry -transfusion per primary    Nausea / Vomiting / Diarrhea  -Await  GI PCR, C-Diff  -PRN zofran  -GI consulted per primary   Anemia ? Bleeding but no obvious source  and FOBT negative. Thrombocytopenia  Has been taking NSAIDS at home for pain --Await retic ct, LDH, Haptoglobin to review for hemolysis (although bilirubin not elevated) -SCD's for DVT, no anticoagulation    Hypokalemia  Hyponatremia  Hypomagnesemia  -Repleted  Since no cause of blood loss found, suggest hematology consult.  Doubt retroperitoneal bleed But may have to investigate with CT chest/abdomen PCCM will be available as needed  Cyril Mourning MD. FCCP. [facility]  If no response to pager , please call [phone]   10/11/2019

## 2019-10-11 NOTE — Plan of Care (Signed)

## 2019-10-11 NOTE — Progress Notes (Signed)
Kristin [name] 2:11 PM  Subjective: Patient seen and examined and case discussed with the hospital team and she does not have any signs of bleeding currently no specific GI complaints and she wants to eat and she is tolerating clear liquids and she has not had a bowel movement since she has been here  Objective: Vital signs stable afebrile abdomen is soft nontender BUN and creatinine normal liver tests normal CBC okay this morning did require transfusion and hemoglobin dropped with significant hydration but electrolyte abnormalities improved  Assessment: Multiple medical problems but no signs of GI blood loss  Plan: Agree with CT scan and will check on tomorrow and please call if obvious signs of active bleeding and if CT okay and you do not feel that GI work-up is imminently needed okay to slowly advance diet  Kristin [name] E  office [phone] After 5PM or if no answer call [phone]

## 2019-10-11 NOTE — Progress Notes (Signed)
PT Cancellation Note  Patient Details Name: Kristin [name] MRN: [id number] DOB: [birth year]   Cancelled Treatment:    Reason Eval/Treat Not Completed: PT screened, no needs identified, will sign off. Patient declined need for PT . Reports right knee is recovering from surgery, had gone back to work prior to need for hospital.    Rada Hay 10/11/2019, 8:27 AM  Blanchard Kelch PT Acute Rehabilitation Services Pager [phone] Office [phone] .

## 2019-10-11 NOTE — Progress Notes (Signed)
PROGRESS NOTE    Kristin [name]  ZOX:[id number] DOB: Feb 10, 1976 DOA: 10/10/2019 PCP: System, Pcp Not In   Brief Narrative:  Kristin [name] is a 44 y.o. female with medical history significant for GERD acid reflux, recent acute lateral meniscus tear of the right knee, anxiety depression and bipolar type I, hypertension, back pain, as well as other comorbidities who presented with a chief complaint passing out twice associated with nausea vomiting diarrhea and fatigue after recent knee procedure.  Patient states that 3 weeks ago she started feeling more fatigued and has been nauseous and vomiting and had very poor p.o. intake.  States that her acid reflux is bothering her again and has had multiple episodes of emesis but denies any blood in it.  Has been taking ibuprofen intermittently since her knee procedure on the 20th.  States that overall her general health started declining and started having poor p.o. intake and also started having some diarrhea.  Feels nauseated and has been vomiting acutely for last 3 days.  States that she started having diarrhea for the last week but has not noticed any blood in it but states it was dark one time.  She continues to have heartburn symptoms with occasional dysphagia to solids.  Has not felt hungry and has lost weight and presented today with syncope x2.  Her blood pressure was significantly lower than baseline and she is given IV fluid hydration.  She denies any chest pain, lightheadedness or dizziness currently.  TRH was asked admit this patient for syncope and of note she has an acute anemia of unknown etiology so gastroenterology was consulted given her recent use of NSAIDs.  Her right knee looks okay and is unlikely the source of her bleeding.   ED Course: In the ED she had basic blood work normal as well as an FOBT and had EKG and a knee x-ray.  She was given IV fluid hydration and gastroenterology was consulted for further evaluation recommendations.  Of note  SARS-CoV-2 testing is negative.  **Interim History   Assessment & Plan:   Active Problems:   Osteoarthritis of right knee   AKI (acute kidney injury) ([place])   Bipolar disorder in remission ([place])   Dehydration   Esophagitis determined by endoscopy   Syncope   Anemia  Syncope and Unresponsiveness the setting of hypotension and Dehydration and Anemia severe with significant hypotension -Place in observation telemetry but changed SDU and now to Inpatient  -Continue with cardiac monitoring and transferred from the SDU -Checked cardiac troponins and they're flat and high-sensitivity is 6 x2  -TSH and echocardiogram; echocardiogram is done and ECHO unremarkable and showed an EF of 55-60% -Patient became extremely hypotensive yesterday with a blood pressure in the 50s over 40s this was after she took some oxycodone and some other pills in her purse next-she was given Narcan x2 and she is status post 6 L of total fluid boluses she is 2 units of PRBCs -Pressure is much improved and she has been able to be transferred out of the stepdown unit to the medical floor continue telemetry -Obtain a head CT without contrast given her unresponsive episode in the ED yesterday and showed "No acute intracranial hemorrhage, mass effect, or evidence of acute Infarction." -Check orthostatics and repeat them in the a.m. -She is status post 6 L of fluid and was placed on maintenance IV fluid at 75 mL's per hour this was changed to sodium bicarbonate; she also received 2 units of PRBCs -Clinically  looks dehydrated in the setting of her nausea vomiting and diarrhea -Ordered EEG as well for her unresponsive episode in the ED but patient denied this and refused given that she did not want to mess up her hair -TSH was 0.374 -We'll get PT and OT to further evaluate and treat and they signed off the case as patient has refused  Acute anemia, unclear etiology at this point but unsure if she is having a slow GI  bleeding -FOBT was negative x1 -Hemoglobin/hematocrit on admission is now 7.8/20.5 and is a marked drop from her last drawing last month where hemoglobin was 12.2/35.0; hemoglobin/likely further dropped down to 5.2/15.8 after fluid resuscitation and a stat H&H was repeated and it was 6.5/19.8 and she was given 2 units of PRBCs and hemoglobin/hematocrit is 1 is now 9.2/20.0 -Placed on IV PPI 40 mg every 12 and have 2 large-bore IVs  -continue IV fluid hydration and changed to sodium bicarbonate  -type and screen and transfuse units of PRBCs if hemoglobin drops below 7 -Check anemia panel and showed an iron level of 37, U IBC of 60, TIBC of 97, saturation ratios of 38%, ferritin level 41, folate level 4.7, and vitamin B12 1467 -Continue to monitor for signs and symptoms of bleeding; currently no overt bleeding noted -Have Consulted gastroenterology for further evaluation recommendations and they do not feel any urgency to scope the patient as she has had history of esophagitis and gastritis and already on PPI -Unlikely that the source of her bleeding is from her knee as her knee is not warm and erythematous and not swollen and not bruised appearing.  X-ray did not find any soft tissue changes -Obtained a CT of the abdomen pelvis to look for retroperitoneal bleed and showed no evidence of bleeding there -Patient does not have any menorrhagia and she has had history of menorrhagia but had an endometrial ablation and has not had menstrual period normal for 2 years -She does drink alcohol almost on a daily basis and this could relate be related to alcoholic gastritis in combination with NSAIDs but GI again does not want a scope with patient and recommended obtain a CT of the abdomen pelvis; CT of the abdomen pelvis shows possible colitis and will treat this with IV antibiotics but this does not give me an explanation of her acute anemia as her hemoglobin was normal a month ago but medical oncology could feel  that this could be anemia due to malabsorption -Hematology was consulted for further evaluation recommendations and her anemia work-up showed low reticulocyte count, normal MCV, elevated ferritin and B12 level and there is no lab evidence of hemolysis -PT/INR was 13.7/1.1 respectively -Medical oncology started the patient on 1 mg of folic acid daily given her folic acid deficiency  -Dr. Mosetta Putt reviewed her iron studies and feels that this could be anemia of chronic disease possibly related to history of esophagitis, and recent colitis and also reviewed her peripheral blood smear which was unremarkable -CT did show evidence of colitis with mild hepatomegaly and hepatic steatosis and currently medical oncology will hold on the bone marrow procedure for now and follow-up with the patient to 3 weeks after discharge if her anemia persists and will consider a bone marrow biopsy later down the line -She is on a clear liquid diet but since GI not planning on scoping the patient we have advanced this -Currently she has no overt bleeding noted but will need to watch carefully for any signs or symptoms  of bleeding GI recommends to call if obvious signs of active bleeding  AKI Metabolic Acidosis, mild -Patient's BUNs/creatinine is now 13/2.06 and in the setting of dehydration as well as taking NSAIDs and her ACE and hydrochlorothiazide;; this is now stopped; her BUN/creatinine is now improved significantly is now 9/0.88 -Her antihypertensive medications given that she is hypotensive on admission -Patient CO2 is 14, anion gap is 11, chloride level 109 -IV fluid resuscitation changed to sodium bicarbonate at 75 mL's per hour -avoid nephrotoxic medications, contrast dyes, hypotension and and adjust medications-continue to monitor and trend renal function carefully and repeat CMP in a.m. -Patient has had poor oral intake so we'll give IV fluid hydration for now and put her on a clear liquid diet given that she may  need an EGD but GI it does not want to scope the patient  Hyponatremia/Hypochloremia -In the setting of dehydration from poor p.o. intake, nausea vomiting and diarrhea -Sodium is now 134, chloride level 109 -IV fluid hydration as above and this was changed to sodium bicarbonate drip -Continue monitor and trend and repeat CMP in a.m.  Hypokalemia -Patient potassium was 2.7 and is now 3.9 -Check magnesium level -Continue to monitor and replete as necessary -Repeat CMP in a.m.  Hyperglycemia in the setting of prediabetes -Patient blood sugar on admission was 106 -check hemoglobin A1c and it was 5.8 -If necessary will place on sensitive NovoLog type II insulin and will need to continue monitor blood sugars extremely carefully  Mildly elevated AST improved -in the setting of dehydration -GI obtained a right upper quadrant ultrasound showed hepatic steatosis -T of the abdomen and pelvis done given concern for retroperitoneal bleed showed automatically as well as diffuse hepatic steatosis without focal hepatic parenchymal abnormality  Nausea, vomiting, diarrhea with poor p.o. intake during of acute colitis -Questionable viral gastroenteritis  but CT of the abdomen pelvis shows colitis as the CT showed wall thickening involving the cecum, ascending colon and transverse colon possibly indicating colitis -SARS-CoV-2 testing was negative next-she does have a history of GERD which is pretty significant and states that she has had similar symptoms when her GERD starts acting up none-continue with IV PPI pantoprazole 40 g every 12 and have GI evaluate further -We will start empiric antibiotics with IV ceftriaxone and IV metronidazole -She is on a clear liquid diet but this has not been advanced to full liquid diet and then to soft Continue with antiemetics and continue supportive care -Currently getting IV fluid hydration with sodium bicarb given her acidosis -We will obtain stool studies and  GI as ordered a C. difficile via PCR even though I do not feel like she has C. difficile given her lack of white count and tach so this was reordered as a time to; continue with enteric precautions and follow-up on stool studies  Recent knee surgery with right knee lateral meniscus tear -She is status post knee arthroscopy with lateral meniscectomy and was given aspirin for DVT prophylaxis and recommended outpatient PT -X-ray of the right knee showed "New well corticated ossific density projecting over the posterior joint space in comparison to prior. This may reflect a loose body versus new osteophyte. Mild medial and patellofemoral compartment osteoarthritis" -Planing of very much knee pain but has been taking ibuprofen as needed for the pain  GERD/history of LA grade C erosive esophagitis and history of chronic gastritis -Continue with IV PPI  Hypocalcemia -Patient's calcium level was 7.7 and dropped down to 6.3 and now 7.3 today  and given another dose of IV calcium gluconate -Replete with IV calcium gluconate 1 g  -continue monitor and replete as necessary -Repeat CMP in a.m.  Anxiety and Depression/Bipolar Disorder Type 1 -C/w Hydroxyzine 25 mg po Daily and Olanzapine 5 mg po qHS  Elevated D-dimer -Mildly elevated at 0.61 -CT of the chest showed no evidence of PE  Right middle lobe lung nodule -She has a 3 mm lung nodule involving the right middle lobe For follow-up in the outpatient setting in 12 months with a noncontrast CT scan  History of alcohol abuse -She drinks at least 3 beers a day and does liquor intermittently -Placed on the CIWA protocol and monitor for signs and symptoms withdrawal -Continue folic acid, multivitamin, thiamine daily  Thrombocytopenia  -Patient's platelet count dropped to 114,000 and is now 132,000 -Continue to monitor for signs and symptoms of bleeding; currently no overt bleeding noted -Repeat CBC in a.m.  Obesity -Estimated body mass  index is 31.94 kg/m as calculated from the following:   Height as of 09/30/19: 5' 4.5" (1.638 m).   Weight as of this encounter: 85.7 kg. -Weight Loss and Dietary Counseling given  DVT prophylaxis: SCDs given acute anemia Code Status: FULL CODE Family Communication: No family present at bedside  Disposition Plan: Pending Further improvement back to baseline and stabilization; Currently on Abx now for Suspected Colitis   Status is: Inpatient  Remains inpatient appropriate because:Hemodynamically unstable, Persistent severe electrolyte disturbances, Ongoing diagnostic testing needed not appropriate for outpatient work up, Unsafe d/c plan, IV treatments appropriate due to intensity of illness or inability to take PO and Inpatient level of care appropriate due to severity of illness   Dispo: The patient is from: Home              Anticipated d/c is to: Home              Anticipated d/c date is: 2 days              Patient currently is not medically stable to d/c.  Consultants:   Gastroenterology  PCCM  Medical Oncology    Procedures:  ECHOCARDIOGRAM IMPRESSIONS    1. Left ventricular ejection fraction, by estimation, is 55 to 60%. The  left ventricle has normal function. The left ventricle has no regional  wall motion abnormalities. Left ventricular diastolic parameters were  normal.  2. Right ventricular systolic function is normal. The right ventricular  size is normal.  3. The mitral valve is normal in structure. No evidence of mitral valve  regurgitation. No evidence of mitral stenosis.  4. The aortic valve is normal in structure. Aortic valve regurgitation is  not visualized. No aortic stenosis is present.  5. The inferior vena cava is normal in size with greater than 50%  respiratory variability, suggesting right atrial pressure of 3 mmHg.   FINDINGS  Left Ventricle: Left ventricular ejection fraction, by estimation, is 55  to 60%. The left ventricle has  normal function. The left ventricle has no  regional wall motion abnormalities. The left ventricular internal cavity  size was normal in size. There is  no left ventricular hypertrophy. Left ventricular diastolic parameters  were normal. Normal left ventricular filling pressure.   Right Ventricle: The right ventricular size is normal. No increase in  right ventricular wall thickness. Right ventricular systolic function is  normal.   Left Atrium: Left atrial size was not well visualized.   Right Atrium: Right atrial size was not well visualized.  Pericardium: There is no evidence of pericardial effusion.   Mitral Valve: The mitral valve is normal in structure. Normal mobility of  the mitral valve leaflets. No evidence of mitral valve regurgitation. No  evidence of mitral valve stenosis.   Tricuspid Valve: The tricuspid valve is normal in structure. Tricuspid  valve regurgitation is trivial. No evidence of tricuspid stenosis.   Aortic Valve: The aortic valve is normal in structure. Aortic valve  regurgitation is not visualized. No aortic stenosis is present.   Pulmonic Valve: The pulmonic valve was normal in structure. Pulmonic valve  regurgitation is not visualized. No evidence of pulmonic stenosis.   Aorta: The aortic root is normal in size and structure.   Venous: The inferior vena cava is normal in size with greater than 50%  respiratory variability, suggesting right atrial pressure of 3 mmHg.   IAS/Shunts: No atrial level shunt detected by color flow Doppler.     LEFT VENTRICLE  PLAX 2D  LVIDd:     5.24 cm   Diastology  LVIDs:     3.80 cm   LV e' lateral:  13.30 cm/s  LV PW:     0.68 cm   LV E/e' lateral: 3.8  LV IVS:    0.70 cm   LV e' medial:  9.03 cm/s  LVOT diam:   2.10 cm   LV E/e' medial: 5.6  LV SV:     62  LV SV Index:  34  LVOT Area:   3.46 cm    LV Volumes (MOD)  LV vol d, MOD A2C: 107.0 ml  LV vol d, MOD  A4C: 128.0 ml  LV vol s, MOD A2C: 57.4 ml  LV vol s, MOD A4C: 58.0 ml  LV SV MOD A2C:   49.6 ml  LV SV MOD A4C:   128.0 ml  LV SV MOD BP:   60.5 ml   RIGHT VENTRICLE  RV S prime:   11.60 cm/s  TAPSE (M-mode): 1.6 cm   LEFT ATRIUM       Index    RIGHT ATRIUM     Index  LA diam:    2.80 cm 1.53 cm/m RA Area:   7.33 cm  LA Vol (A2C):  16.9 ml 9.22 ml/m RA Volume:  11.70 ml 6.38 ml/m  LA Vol (A4C):  21.7 ml 11.84 ml/m  LA Biplane Vol: 21.2 ml 11.56 ml/m  AORTIC VALVE  LVOT Vmax:  106.00 cm/s  LVOT Vmean: 72.300 cm/s  LVOT VTI:  0.179 m    AORTA  Ao Root diam: 3.30 cm   MITRAL VALVE  MV Area (PHT): 4.06 cm  SHUNTS  MV Decel Time: 187 msec  Systemic VTI: 0.18 m  MV E velocity: 50.90 cm/s Systemic Diam: 2.10 cm  MV A velocity: 51.40 cm/s  MV E/A ratio: 0.99   Antimicrobials:  Anti-infectives (From admission, onward)   Start     Dose/Rate Route Frequency Ordered Stop   10/11/19 1800  cefTRIAXone (ROCEPHIN) 2 g in sodium chloride 0.9 % 100 mL IVPB     Discontinue     2 g 200 mL/hr over 30 Minutes Intravenous Every 24 hours 10/11/19 1540     10/11/19 1800  metroNIDAZOLE (FLAGYL) IVPB 500 mg     Discontinue     500 mg 100 mL/hr over 60 Minutes Intravenous Every 8 hours 10/11/19 1540       Subjective: Seen and examined at bedside and states that she was hungry has not had a  bowel movement today.  No nausea or vomiting currently.  Feels okay and better than yesterday.  No other concerns or complaints at this time denies any chest pain.  Objective: Vitals:   10/11/19 1157 10/11/19 1200 10/11/19 1520 10/11/19 1836  BP:    (!) 133/92  Pulse:  87  78  Resp:  15  20  Temp: 98.7 F (37.1 C)  98.7 F (37.1 C) 98.1 F (36.7 C)  TempSrc: Oral  Oral Oral  SpO2:  100%  100%  Weight:        Intake/Output Summary (Last 24 hours) at 10/11/2019 1851 Last data filed at 10/11/2019 1714 Gross per 24 hour  Intake 1855.63 ml  Output  --  Net 1855.63 ml   Filed Weights   10/11/19 0500  Weight: 85.7 kg   Examination: Physical Exam:  Constitutional: WN/WD obese African-American female currently in NAD and appears calm and comfortable Eyes: Lids and conjunctivae normal, sclerae anicteric  ENMT: External Ears, Nose appear normal. Grossly normal hearing. Neck: Appears normal, supple, no cervical masses, normal ROM, no appreciable thyromegaly: No JVD Respiratory: Diminished to auscultation bilaterally, no wheezing, rales, rhonchi or crackles. Normal respiratory effort and patient is not tachypenic. No accessory muscle use.  Cardiovascular: Tachycardic rate but regular rhythm, no murmurs / rubs / gallops. S1 and S2 auscultated.  Minimal extremity edema Abdomen: Soft, minimally-tender, distended secondary body habitus.Bowel sounds positive.  GU: Deferred. Musculoskeletal: No clubbing / cyanosis of digits/nails. No joint deformity upper and lower extremities.  Skin: No rashes, lesions, ulcers on limited skin evaluation.  Is multiple tattoos diffusely scattered throughout her body no induration; Warm and dry.  Neurologic: CN 2-12 grossly intact with no focal deficits. Romberg sign and cerebellar reflexes not assessed.  Psychiatric: Normal judgment and insight. Alert and oriented x 3. Normal mood and appropriate affect.   Data Reviewed: I have personally reviewed following labs and imaging studies  CBC: Recent Labs  Lab 10/10/19 0712 10/10/19 1319 10/10/19 1520 10/11/19 0536  WBC 7.2 5.1  --  6.2  NEUTROABS 4.9 2.9  --  4.1  HGB 7.8* 5.2* 6.5* 9.2*  HCT 23.5* 15.8* 19.8* 28.0*  MCV 91.8 94.6  --  93.3  PLT 175 114*  --  768*   Basic Metabolic Panel: Recent Labs  Lab 10/10/19 0712 10/10/19 1319 10/11/19 0536  NA 125* 132* 134*  K 2.7* 3.3* 3.9  CL 90* 105 109  CO2 20* 18* 14*  GLUCOSE 106* 99 105*  BUN '13 11 9  '$ CREATININE 2.06* 1.66* 0.88  CALCIUM 7.7* 6.3* 7.3*  MG  --  1.1* 1.9  PHOS  --  3.2 2.9    GFR: Estimated Creatinine Clearance: 86.4 mL/min (by C-G formula based on SCr of 0.88 mg/dL). Liver Function Tests: Recent Labs  Lab 10/10/19 0712 10/10/19 1319 10/11/19 0536  AST 49* 30 41  ALT '13 9 12  '$ ALKPHOS 133* 79 106  BILITOT 0.7 0.2* 0.5  PROT 6.2* 4.0* 5.3*  ALBUMIN 3.4* 2.2* 2.9*   No results for input(s): LIPASE, AMYLASE in the last 168 hours. No results for input(s): AMMONIA in the last 168 hours. Coagulation Profile: Recent Labs  Lab 10/10/19 1557  INR 1.1   Cardiac Enzymes: No results for input(s): CKTOTAL, CKMB, CKMBINDEX, TROPONINI in the last 168 hours. BNP (last 3 results) No results for input(s): PROBNP in the last 8760 hours. HbA1C: Recent Labs    10/11/19 0536  HGBA1C 5.8*   CBG: Recent Labs  Lab  10/10/19 1303 10/11/19 0804  GLUCAP 132* 117*   Lipid Profile: No results for input(s): CHOL, HDL, LDLCALC, TRIG, CHOLHDL, LDLDIRECT in the last 72 hours. Thyroid Function Tests: Recent Labs    10/11/19 0757  TSH 0.374   Anemia Panel: Recent Labs    10/10/19 0712 10/10/19 0847 10/11/19 0536  VITAMINB12  --  1,467*  --   FOLATE  --  4.7*  --   FERRITIN  --  481*  --   TIBC  --  97*  --   IRON  --  37  --   RETICCTPCT 1.1  --  0.8   Sepsis Labs: No results for input(s): PROCALCITON, LATICACIDVEN in the last 168 hours.  Recent Results (from the past 240 hour(s))  Culture, group A strep (throat)     Status: None   Collection Time: 10/05/19  5:52 PM   Specimen: Throat  Result Value Ref Range Status   Specimen Description THROAT  Final   Special Requests NONE  Final   Culture   Final    NO GROUP A STREP (S.PYOGENES) ISOLATED Performed at [facility], [address], [place], [place] [address]    Report Status 10/08/2019 FINAL  Final  SARS Coronavirus 2 by RT PCR (hospital order, performed in [facility] hospital lab) Nasopharyngeal Nasopharyngeal Swab     Status: None   Collection Time: 10/10/19  8:33 AM   Specimen:  Nasopharyngeal Swab  Result Value Ref Range Status   SARS Coronavirus 2 NEGATIVE NEGATIVE Final    Comment: (NOTE) SARS-CoV-2 target nucleic acids are NOT DETECTED.  The SARS-CoV-2 RNA is generally detectable in upper and lower respiratory specimens during the acute phase of infection. The lowest concentration of SARS-CoV-2 viral copies this assay can detect is 250 copies / mL. A negative result does not preclude SARS-CoV-2 infection and should not be used as the sole basis for treatment or other patient management decisions.  A negative result may occur with improper specimen collection / handling, submission of specimen other than nasopharyngeal swab, presence of viral mutation(s) within the areas targeted by this assay, and inadequate number of viral copies (<250 copies / mL). A negative result must be combined with clinical observations, patient history, and epidemiological information.  Fact Sheet for Patients:   StrictlyIdeas.no  Fact Sheet for Healthcare Providers: BankingDealers.co.za  This test is not yet approved or  cleared by the [place] FDA and has been authorized for detection and/or diagnosis of SARS-CoV-2 by FDA under an Emergency Use Authorization (EUA).  This EUA will remain in effect (meaning this test can be used) for the duration of the COVID-19 declaration under Section 564(b)(1) of the Act, 21 U.S.C. section 360bbb-3(b)(1), unless the authorization is terminated or revoked sooner.  Performed at [facility], [place] [address] [place], [place] [address]   Culture, blood (routine x 2)     Status: None (Preliminary result)   Collection Time: 10/10/19  1:57 PM   Specimen: BLOOD  Result Value Ref Range Status   Specimen Description   Final    BLOOD RIGHT ANTECUBITAL Performed at [facility] [address] [place], [place] [address]    Special Requests   Final     BOTTLES DRAWN AEROBIC AND ANAEROBIC Blood Culture adequate volume Performed at [facility] [address] [place], [place] [address]    Culture   Final    NO GROWTH < 24 HOURS Performed at [facility], [place] [address], [place], [place] [address]    Report Status PENDING  Incomplete  Culture, blood (routine x 2)     Status: None (Preliminary result)   Collection Time: 10/10/19  1:57 PM   Specimen: BLOOD  Result Value Ref Range Status   Specimen Description   Final    BLOOD LEFT ANTECUBITAL Performed at [facility] [address], [place], [place] [address]    Special Requests   Final    BOTTLES DRAWN AEROBIC AND ANAEROBIC Blood Culture adequate volume Performed at [facility] [address], [place], [place] [address]    Culture   Final    NO GROWTH < 24 HOURS Performed at [facility] [address], [place], [place] [address]    Report Status PENDING  Incomplete  MRSA PCR Screening     Status: None   Collection Time: 10/10/19 10:45 PM   Specimen: Nasopharyngeal  Result Value Ref Range Status   MRSA by PCR NEGATIVE NEGATIVE Final    Comment:        The GeneXpert MRSA Assay (FDA approved for NASAL specimens only), is one component of a comprehensive MRSA colonization surveillance program. It is not intended to diagnose MRSA infection nor to guide or monitor treatment for MRSA infections. Performed at [facility], [place] [address], [place], [place] [address]   C Difficile Quick Screen w PCR reflex     Status: None   Collection Time: 10/11/19  4:05 PM   Specimen: Rectum; Stool  Result Value Ref Range Status   C Diff antigen NEGATIVE NEGATIVE Final   C Diff toxin NEGATIVE NEGATIVE Final   C Diff interpretation No C. difficile detected.  Final    Comment: Performed at [facility], [place] [address], [place], [place] [address]     RN Pressure Injury  Documentation:     Estimated body mass index is 31.94 kg/m as calculated from the following:   Height as of 09/30/19: 5' 4.5" (1.638 m).   Weight as of this encounter: 85.7 kg.  Malnutrition Type:      Malnutrition Characteristics:      Nutrition Interventions:     Radiology Studies: DG Knee 1-2 Views Right  Result Date: 10/10/2019 CLINICAL DATA:  Pain.  Recent knee arthroscopy. EXAM: RIGHT KNEE - 1-2 VIEW COMPARISON:  January 12, 2019 FINDINGS: No acute fracture or dislocation. Mild degenerative changes of the medial and patellofemoral compartments. Enthesiophyte at the quadriceps tendon insertion on the patella. There is a new well corticated ossific density projecting over the posterior joint space in comparison to prior. This may reflect a loose body versus new osteophyte. No area of erosion or osseous destruction. No unexpected radiopaque foreign body. Soft tissues are unremarkable. IMPRESSION: 1. New well corticated ossific density projecting over the posterior joint space in comparison to prior. This may reflect a loose body versus new osteophyte. 2. Mild medial and patellofemoral compartment osteoarthritis. Electronically Signed   By: Valentino Saxon MD   On: 10/10/2019 09:17   CT HEAD WO CONTRAST  Result Date: 10/10/2019 CLINICAL DATA:  Mental status change EXAM: CT HEAD WITHOUT CONTRAST TECHNIQUE: Contiguous axial images were obtained from the base of the skull through the vertex without intravenous contrast. COMPARISON:  None. FINDINGS: Brain: There is no acute intracranial hemorrhage, mass effect, or edema. Gray-white differentiation is preserved. There is no extra-axial fluid collection. Ventricles and sulci are within normal limits in size  and configuration. Patchy hypoattenuation in the supratentorial white matter is nonspecific but may reflect mild early microvascular ischemic changes or gliosis/demyelination of other etiologies. Vascular: No hyperdense vessel or  unexpected calcification. Skull: Calvarium is unremarkable. Sinuses/Orbits: No acute finding. Other: None. IMPRESSION: No acute intracranial hemorrhage, mass effect, or evidence of acute infarction. Electronically Signed   By: Macy Mis M.D.   On: 10/10/2019 15:04   CT ANGIO CHEST PE W OR WO CONTRAST  Result Date: 10/11/2019 CLINICAL DATA:  44 year old presenting with multiple recent syncopal episodes, three-month history of nausea and vomiting, 2 week history of diarrhea, and acute severe generalized abdominal pain. Elevated D-dimer. EXAM: CT ANGIOGRAPHY CHEST CT ABDOMEN AND PELVIS WITH CONTRAST TECHNIQUE: Multidetector CT imaging of the chest was performed using the standard protocol during bolus administration of intravenous contrast. Multiplanar CT image reconstructions and MIPs were obtained to evaluate the vascular anatomy. Multidetector CT imaging of the abdomen and pelvis was performed using the standard protocol during bolus administration of intravenous contrast. CONTRAST:  174m OMNIPAQUE IOHEXOL 350 MG/ML IV. COMPARISON:  CT abdomen and pelvis 03/20/2018 and earlier. No prior chest CT. FINDINGS: CTA CHEST FINDINGS Cardiovascular: Contrast opacification of the pulmonary arteries is very good. Respiratory motion blurred images in the mid and upper lungs. Overall, the study is of good diagnostic quality. No filling defects within either main pulmonary artery or their segmental branches in either lung to suggest pulmonary embolism. Normal heart size. No pericardial effusion. No visible coronary atherosclerosis. No visible atherosclerosis involving the thoracic or proximal abdominal aorta or their visible branches. No evidence of aneurysm. Mediastinum/Nodes: Numerous normal sized lymph nodes throughout the mediastinum and in both hila. No significant lymphadenopathy. Normal appearing esophagus. Visualized thyroid gland normal in appearance. Lungs/Pleura: 3 mm nodule in the RIGHT MIDDLE LOBE (8/80).  No pulmonary parenchymal nodules or masses elsewhere in either lung. No confluent or ground-glass airspace consolidation. No evidence of interstitial lung disease. Central airways patent without significant bronchial wall thickening. No pleural effusions. Musculoskeletal: Mild degenerative disc disease and spondylosis involving the UPPER and lower thoracic spine. No acute findings. Review of the MIP images confirms the above findings. CT ABDOMEN and PELVIS FINDINGS Hepatobiliary: Borderline to mild hepatomegaly. Diffuse hepatic steatosis without focal hepatic parenchymal abnormality. Gallbladder normal in appearance without calcified gallstones. No biliary ductal dilation. Pancreas: Normal in appearance without evidence of mass, ductal dilation, or inflammation. Spleen: Normal in size and appearance. Adrenals/Urinary Tract: Normal appearing adrenal glands. Kidneys normal in size and appearance without focal parenchymal abnormality. No hydronephrosis. No evidence of urinary tract calculi. Decompressed urinary bladder with mild circumferential wall thickening which is somewhat out of proportion to that expected for the decompressed state. Stomach/Bowel: Stomach normal in appearance for the degree of distention. Normal-appearing small bowel. Tortuous and redundant sigmoid colon expected colonic stool burden. Wall thickening involving the cecum, ascending colon and transverse colon. Normal appendix in the RIGHT mid and upper pelvis. Vascular/Lymphatic: No visible aortoiliofemoral atherosclerosis. Widely patent visceral arteries. Normal-appearing portal venous and systemic venous systems. No pathologic lymphadenopathy. Reproductive: Normal-appearing uterus and ovaries without evidence of adnexal mass. Other: Very small supraumbilical midline ANTERIOR abdominal wall hernia containing fat and fluid. Musculoskeletal: Regional skeleton unremarkable without acute or significant osseous abnormality. IMPRESSION: 1. No evidence  of pulmonary embolism. 2. No acute cardiopulmonary disease. 3. 3 mm nodule involving the RIGHT MIDDLE LOBE. No follow-up needed if patient is low-risk. Non-contrast chest CT can be considered in 12 months if patient is high-risk. This recommendation follows the consensus  statement: Guidelines for Management of Incidental Pulmonary Nodules Detected on CT Images: From the Fleischner Society 2017; Radiology 2017; 284:228-243. 4. Wall thickening involving the cecum, ascending colon and transverse colon, possibly indicating colitis. 5. Borderline to mild hepatomegaly. Diffuse hepatic steatosis without focal hepatic parenchymal abnormality. 6. Very small supraumbilical midline ANTERIOR abdominal wall hernia containing fat and fluid. 7. Mild circumferential wall thickening involving the urinary bladder which is somewhat out of proportion to that expected for the decompressed state, unchanged since the CT in January, 2020, query cystitis (interstitial cystitis?). Electronically Signed   By: Evangeline Dakin M.D.   On: 10/11/2019 14:22   CT ABDOMEN PELVIS W CONTRAST  Result Date: 10/11/2019 CLINICAL DATA:  44 year old presenting with multiple recent syncopal episodes, three-month history of nausea and vomiting, 2 week history of diarrhea, and acute severe generalized abdominal pain. Elevated D-dimer. EXAM: CT ANGIOGRAPHY CHEST CT ABDOMEN AND PELVIS WITH CONTRAST TECHNIQUE: Multidetector CT imaging of the chest was performed using the standard protocol during bolus administration of intravenous contrast. Multiplanar CT image reconstructions and MIPs were obtained to evaluate the vascular anatomy. Multidetector CT imaging of the abdomen and pelvis was performed using the standard protocol during bolus administration of intravenous contrast. CONTRAST:  110m OMNIPAQUE IOHEXOL 350 MG/ML IV. COMPARISON:  CT abdomen and pelvis 03/20/2018 and earlier. No prior chest CT. FINDINGS: CTA CHEST FINDINGS Cardiovascular: Contrast  opacification of the pulmonary arteries is very good. Respiratory motion blurred images in the mid and upper lungs. Overall, the study is of good diagnostic quality. No filling defects within either main pulmonary artery or their segmental branches in either lung to suggest pulmonary embolism. Normal heart size. No pericardial effusion. No visible coronary atherosclerosis. No visible atherosclerosis involving the thoracic or proximal abdominal aorta or their visible branches. No evidence of aneurysm. Mediastinum/Nodes: Numerous normal sized lymph nodes throughout the mediastinum and in both hila. No significant lymphadenopathy. Normal appearing esophagus. Visualized thyroid gland normal in appearance. Lungs/Pleura: 3 mm nodule in the RIGHT MIDDLE LOBE (8/80). No pulmonary parenchymal nodules or masses elsewhere in either lung. No confluent or ground-glass airspace consolidation. No evidence of interstitial lung disease. Central airways patent without significant bronchial wall thickening. No pleural effusions. Musculoskeletal: Mild degenerative disc disease and spondylosis involving the UPPER and lower thoracic spine. No acute findings. Review of the MIP images confirms the above findings. CT ABDOMEN and PELVIS FINDINGS Hepatobiliary: Borderline to mild hepatomegaly. Diffuse hepatic steatosis without focal hepatic parenchymal abnormality. Gallbladder normal in appearance without calcified gallstones. No biliary ductal dilation. Pancreas: Normal in appearance without evidence of mass, ductal dilation, or inflammation. Spleen: Normal in size and appearance. Adrenals/Urinary Tract: Normal appearing adrenal glands. Kidneys normal in size and appearance without focal parenchymal abnormality. No hydronephrosis. No evidence of urinary tract calculi. Decompressed urinary bladder with mild circumferential wall thickening which is somewhat out of proportion to that expected for the decompressed state. Stomach/Bowel: Stomach  normal in appearance for the degree of distention. Normal-appearing small bowel. Tortuous and redundant sigmoid colon expected colonic stool burden. Wall thickening involving the cecum, ascending colon and transverse colon. Normal appendix in the RIGHT mid and upper pelvis. Vascular/Lymphatic: No visible aortoiliofemoral atherosclerosis. Widely patent visceral arteries. Normal-appearing portal venous and systemic venous systems. No pathologic lymphadenopathy. Reproductive: Normal-appearing uterus and ovaries without evidence of adnexal mass. Other: Very small supraumbilical midline ANTERIOR abdominal wall hernia containing fat and fluid. Musculoskeletal: Regional skeleton unremarkable without acute or significant osseous abnormality. IMPRESSION: 1. No evidence of pulmonary embolism. 2. No acute  cardiopulmonary disease. 3. 3 mm nodule involving the RIGHT MIDDLE LOBE. No follow-up needed if patient is low-risk. Non-contrast chest CT can be considered in 12 months if patient is high-risk. This recommendation follows the consensus statement: Guidelines for Management of Incidental Pulmonary Nodules Detected on CT Images: From the Fleischner Society 2017; Radiology 2017; 284:228-243. 4. Wall thickening involving the cecum, ascending colon and transverse colon, possibly indicating colitis. 5. Borderline to mild hepatomegaly. Diffuse hepatic steatosis without focal hepatic parenchymal abnormality. 6. Very small supraumbilical midline ANTERIOR abdominal wall hernia containing fat and fluid. 7. Mild circumferential wall thickening involving the urinary bladder which is somewhat out of proportion to that expected for the decompressed state, unchanged since the CT in January, 2020, query cystitis (interstitial cystitis?). Electronically Signed   By: Evangeline Dakin M.D.   On: 10/11/2019 14:22   DG CHEST PORT 1 VIEW  Result Date: 10/10/2019 CLINICAL DATA:  Hypotension. EXAM: PORTABLE CHEST 1 VIEW COMPARISON:  None.  FINDINGS: The heart size and mediastinal contours are within normal limits. Both lungs are clear. The visualized skeletal structures are unremarkable. IMPRESSION: No active disease. Electronically Signed   By: Marijo Conception M.D.   On: 10/10/2019 16:42   DG Abd 2 Views  Result Date: 10/10/2019 CLINICAL DATA:  Abdominal tenderness with nausea and vomiting EXAM: ABDOMEN - 2 VIEW COMPARISON:  Bowel FINDINGS: Bowel gas pattern is unremarkable. There are no significantly dilated loops. No significant stool burden. No air-fluid levels or free air. IMPRESSION: Unremarkable bowel gas pattern. Electronically Signed   By: Macy Mis M.D.   On: 10/10/2019 14:12   ECHOCARDIOGRAM COMPLETE  Result Date: 10/10/2019    ECHOCARDIOGRAM REPORT   Patient Name:   Kristin [name] Date of Exam: 10/10/2019 Medical Rec #:  [id number]      Height:       64.5 in Accession #:    [id number]     Weight:       169.5 lb Date of Birth:  07/26/1975     BSA:          1.833 m Patient Age:    72 years       BP:           128/86 mmHg Patient Gender: F              HR:           90 bpm. Exam Location:  Inpatient Procedure: 2D Echo, Cardiac Doppler and Color Doppler Indications:    Syncope 780.2 / R55  History:        Patient has no prior history of Echocardiogram examinations.                 Signs/Symptoms:Syncope; Risk Factors:Hypertension and Current                 Smoker.  Sonographer:    Vickie Epley RDCS Referring Phys: [id number] [facility] LATIF [place]  1. Left ventricular ejection fraction, by estimation, is 55 to 60%. The left ventricle has normal function. The left ventricle has no regional wall motion abnormalities. Left ventricular diastolic parameters were normal.  2. Right ventricular systolic function is normal. The right ventricular size is normal.  3. The mitral valve is normal in structure. No evidence of mitral valve regurgitation. No evidence of mitral stenosis.  4. The aortic valve is normal in structure. Aortic valve  regurgitation is not visualized. No aortic stenosis is present.  5. The inferior vena cava is  normal in size with greater than 50% respiratory variability, suggesting right atrial pressure of 3 mmHg. FINDINGS  Left Ventricle: Left ventricular ejection fraction, by estimation, is 55 to 60%. The left ventricle has normal function. The left ventricle has no regional wall motion abnormalities. The left ventricular internal cavity size was normal in size. There is  no left ventricular hypertrophy. Left ventricular diastolic parameters were normal. Normal left ventricular filling pressure. Right Ventricle: The right ventricular size is normal. No increase in right ventricular wall thickness. Right ventricular systolic function is normal. Left Atrium: Left atrial size was not well visualized. Right Atrium: Right atrial size was not well visualized. Pericardium: There is no evidence of pericardial effusion. Mitral Valve: The mitral valve is normal in structure. Normal mobility of the mitral valve leaflets. No evidence of mitral valve regurgitation. No evidence of mitral valve stenosis. Tricuspid Valve: The tricuspid valve is normal in structure. Tricuspid valve regurgitation is trivial. No evidence of tricuspid stenosis. Aortic Valve: The aortic valve is normal in structure. Aortic valve regurgitation is not visualized. No aortic stenosis is present. Pulmonic Valve: The pulmonic valve was normal in structure. Pulmonic valve regurgitation is not visualized. No evidence of pulmonic stenosis. Aorta: The aortic root is normal in size and structure. Venous: The inferior vena cava is normal in size with greater than 50% respiratory variability, suggesting right atrial pressure of 3 mmHg. IAS/Shunts: No atrial level shunt detected by color flow Doppler.  LEFT VENTRICLE PLAX 2D LVIDd:         5.24 cm      Diastology LVIDs:         3.80 cm      LV e' lateral:   13.30 cm/s LV PW:         0.68 cm      LV E/e' lateral: 3.8 LV IVS:         0.70 cm      LV e' medial:    9.03 cm/s LVOT diam:     2.10 cm      LV E/e' medial:  5.6 LV SV:         62 LV SV Index:   34 LVOT Area:     3.46 cm  LV Volumes (MOD) LV vol d, MOD A2C: 107.0 ml LV vol d, MOD A4C: 128.0 ml LV vol s, MOD A2C: 57.4 ml LV vol s, MOD A4C: 58.0 ml LV SV MOD A2C:     49.6 ml LV SV MOD A4C:     128.0 ml LV SV MOD BP:      60.5 ml RIGHT VENTRICLE RV S prime:     11.60 cm/s TAPSE (M-mode): 1.6 cm LEFT ATRIUM             Index       RIGHT ATRIUM          Index LA diam:        2.80 cm 1.53 cm/m  RA Area:     7.33 cm LA Vol (A2C):   16.9 ml 9.22 ml/m  RA Volume:   11.70 ml 6.38 ml/m LA Vol (A4C):   21.7 ml 11.84 ml/m LA Biplane Vol: 21.2 ml 11.56 ml/m  AORTIC VALVE LVOT Vmax:   106.00 cm/s LVOT Vmean:  72.300 cm/s LVOT VTI:    0.179 m  AORTA Ao Root diam: 3.30 cm MITRAL VALVE MV Area (PHT): 4.06 cm    SHUNTS MV Decel Time: 187 msec    Systemic VTI:  0.18 m MV E velocity: 50.90 cm/s  Systemic Diam: 2.10 cm MV A velocity: 51.40 cm/s MV E/A ratio:  0.99 Fransico Him MD Electronically signed by Fransico Him MD Signature Date/Time: 10/10/2019/12:52:28 PM    Final    US Abdomen Limited RUQ  Result Date: 10/10/2019 CLINICAL DATA:  Abdominal pain EXAM: ULTRASOUND ABDOMEN LIMITED RIGHT UPPER QUADRANT COMPARISON:  03/20/2018 FINDINGS: Gallbladder: Under distended gallbladder without wall thickening. There is dependent sludge. No shadowing gallstone or focal tenderness. Common bile duct: Diameter: 6 mm Liver: Mildly echogenic. No focal hepatic lesion. Portal vein is patent on color Doppler imaging with normal direction of blood flow towards the liver. IMPRESSION: 1. Gallbladder sludge.  No evidence of cholecystitis. 2. Hepatic steatosis. Electronically Signed   By: Monte Fantasia M.D.   On: 10/10/2019 14:44   Scheduled Meds:  sodium chloride   Intravenous Once   Chlorhexidine Gluconate Cloth  6 each Topical Daily   folic acid  1 mg Oral Daily   magnesium oxide  400 mg Oral Daily    multivitamin with minerals  1 tablet Oral Daily   OLANZapine  5 mg Oral QHS   pantoprazole (PROTONIX) IV  40 mg Intravenous Q12H   sodium chloride (PF)       thiamine  100 mg Oral Daily   Or   thiamine  100 mg Intravenous Daily   Continuous Infusions:  cefTRIAXone (ROCEPHIN)  IV     metronidazole 100 mL/hr at 10/11/19 1714    sodium bicarbonate  infusion 1000 mL Stopped (10/11/19 1703)    LOS: 0 days   Kerney Elbe, DO [facility] PAGER is on [place]  If 7PM-7AM, please contact night-coverage [email or web address]

## 2019-10-11 NOTE — Consult Note (Signed)
[place]  [phone]   HEMATOLOGY ONCOLOGY INPATIENT CONSULTATION   ASRA GAMBREL  DOB: 11-27-1975  MR#: [id number]  CSN#: [id number]    Requesting Physician: [facility]  Patient Care Team: System, Pcp Not In as PCP - General  Reason for consult: anemia   History of present illness: 44 year old African-American female with past medical history of GERD, anxiety, depression, bipolar, hypertension, moderate alcohol drinker, history of iron deficiency, presented with a month history of nausea, vomiting, and poor oral intake, and a few weeks of diarrhea.  She presented to emergency room with syncope episode, with hypotension, hemoglobin was 7.8 in the ED, and went down to 5.2 after hydration.  She received 2 units of blood.  Stool OB was negative.  She was evaluated by GI Dr. Watt Climes, EGD was not recommended.  She was admitted for further work-up.  She is feeling better today.  She denies recent fever, night sweats, mild weight loss lately due to poor oral intake and diarrhea.  She developed iron deficient anemia during her first pregnancy when she was 53, and she has been taking oral iron intermittently.  She had a menorrhagia in the past, for which she underwent endometrial ablation 2 years ago, and she has not had menstrual period.  She feels she is going through menopause lately with hot flashes.  She drinks beer 3 bottles a day, and 1 shot of liquor sometime.  Denies family history of anemia or other blood disorder or malignancy.  Her father died from liver cirrhosis from alcohol.  MEDICAL HISTORY:  Past Medical History:  Diagnosis Date  . Acid reflux   . ACL tear 2011   not sure which side  . Acute lateral meniscus tear of right knee   . Anxiety   . Arthritis   . Back pain    slipped disc lower back lumbar  . Depression   . Eczema   . Hypertension   . Migraine headache   . Neuromuscular disorder ([place])    leg cramps    SURGICAL  HISTORY: Past Surgical History:  Procedure Laterality Date  . BIOPSY  09/19/2017   Procedure: BIOPSY;  Surgeon: Otis Brace, MD;  Location: [facility];  Service: Gastroenterology;;  . Murrell Redden & CURRETTAGE/HYSTROSCOPY WITH HYDROTHERMAL ABLATION N/A 04/04/2017   Procedure: DILATATION & CURETTAGE/HYSTEROSCOPY WITH HYDROTHERMAL ABLATION;  Surgeon: Donnamae Jude, MD;  Location: [facility];  Service: Gynecology;  Laterality: N/A;  . ESOPHAGOGASTRODUODENOSCOPY (EGD) WITH PROPOFOL N/A 09/19/2017   Procedure: ESOPHAGOGASTRODUODENOSCOPY (EGD) WITH PROPOFOL;  Surgeon: Otis Brace, MD;  Location: [facility];  Service: Gastroenterology;  Laterality: N/A;  . KNEE ARTHROSCOPY WITH LATERAL MENISECTOMY Right 09/30/2019   Procedure: KNEE ARTHROSCOPY WITH LATERAL MENISECTOMY;  Surgeon: Renette Butters, MD;  Location: [facility];  Service: Orthopedics;  Laterality: Right;  . TUBAL LIGATION  yrs ago    SOCIAL HISTORY: Social History   Socioeconomic History  . Marital status: Single    Spouse name: Not on file  . Number of children: 3  . Years of education: Not on file  . Highest education level: High school graduate  Occupational History  . Not on file  Tobacco Use  . Smoking status: Current Some Day Smoker    Packs/day: 0.25    Years: 5.00    Pack years: 1.25    Types: Cigarettes  . Smokeless tobacco: Never Used  Vaping Use  . Vaping Use: Never used  Substance and Sexual Activity  .  Alcohol use: Yes    Comment: occassionally, longer than one week ago  . Drug use: No  . Sexual activity: Yes    Birth control/protection: Surgical  Other Topics Concern  . Not on file  Social History Narrative  . Not on file   Social Determinants of Health   Financial Resource Strain:   . Difficulty of Paying Living Expenses:   Food Insecurity:   . Worried About Charity fundraiser in the Last Year:   . Arboriculturist in the Last Year:   Transportation  Needs:   . Film/video editor (Medical):   Marland Kitchen Lack of Transportation (Non-Medical):   Physical Activity:   . Days of Exercise per Week:   . Minutes of Exercise per Session:   Stress:   . Feeling of Stress :   Social Connections:   . Frequency of Communication with Friends and Family:   . Frequency of Social Gatherings with Friends and Family:   . Attends Religious Services:   . Active Member of Clubs or Organizations:   . Attends Archivist Meetings:   Marland Kitchen Marital Status:   Intimate Partner Violence:   . Fear of Current or Ex-Partner:   . Emotionally Abused:   Marland Kitchen Physically Abused:   . Sexually Abused:     FAMILY HISTORY: Family History  Problem Relation Age of Onset  . Cancer Maternal Grandmother        Breast  . Hypertension Mother   . Hypertension Brother   . Cancer Maternal Uncle        colon  . Bipolar disorder Cousin   . Schizophrenia Cousin     ALLERGIES:  is allergic to sumatriptan, other, and eggs or egg-derived products.  MEDICATIONS:  Current Facility-Administered Medications  Medication Dose Route Frequency Provider Last Rate Last Admin  . 0.9 %  sodium chloride infusion (Manually program via Guardrails IV Fluids)   Intravenous Once Kellogg, Georgina Quint Latif, DO      . acetaminophen (TYLENOL) tablet 650 mg  650 mg Oral Q6H PRN Raiford Noble [place], DO   650 mg at 10/10/19 2301   Or  . acetaminophen (TYLENOL) suppository 650 mg  650 mg Rectal Q6H PRN Raiford Noble Latif, DO      . bisacodyl (DULCOLAX) suppository 10 mg  10 mg Rectal Daily PRN Raiford Noble Latif, DO      . Chlorhexidine Gluconate Cloth 2 % PADS 6 each  6 each Topical Daily Raiford Noble [place], [place]   6 each at 10/11/19 4097  . folic acid (FOLVITE) tablet 1 mg  1 mg Oral Daily Truitt Merle, MD      . hydrALAZINE (APRESOLINE) injection 5 mg  5 mg Intravenous Q6H PRN Raiford Noble Latif, DO      . hydrOXYzine (ATARAX/VISTARIL) tablet 25 mg  25 mg Oral Daily PRN Sheikh, Georgina Quint Latif, DO      .  magnesium oxide (MAG-OX) tablet 400 mg  400 mg Oral Daily Raiford Noble [place], DO   400 mg at 10/11/19 3532  . OLANZapine (ZYPREXA) tablet 5 mg  5 mg Oral QHS Sheikh, Georgina Quint [place], DO   5 mg at 10/10/19 2350  . ondansetron (ZOFRAN) tablet 4 mg  4 mg Oral Q6H PRN Raiford Noble Latif, DO       Or  . ondansetron [facility]) injection 4 mg  4 mg Intravenous Q6H PRN Sheikh, Omair Latif, DO      . pantoprazole (PROTONIX) injection 40 mg  40 mg Intravenous Q12H Salley Slaughter, PA-C   40 mg at 10/11/19 8099  . polyethylene glycol (MIRALAX / GLYCOLAX) packet 17 g  17 g Oral Daily PRN Sheikh, Omair Latif, DO      . sodium bicarbonate 150 mEq in dextrose 5 % 1,000 mL infusion   Intravenous Continuous Raiford Noble [place], DO 75 mL/hr at 10/11/19 1200 Rate Verify at 10/11/19 1200  . sodium chloride (PF) 0.9 % injection             REVIEW OF SYSTEMS:   Constitutional: Denies fevers, chills or abnormal night sweats Eyes: Denies blurriness of vision, double vision or watery eyes Ears, nose, mouth, throat, and face: Denies mucositis or sore throat Respiratory: Denies cough, dyspnea or wheezes Cardiovascular: Denies palpitation, chest discomfort or lower extremity swelling Gastrointestinal:  Denies nausea, heartburn or change in bowel habits Skin: Denies abnormal skin rashes Lymphatics: Denies new lymphadenopathy or easy bruising Neurological:Denies numbness, tingling or new weaknesses Behavioral/Psych: Mood is stable, no new changes  All other systems were reviewed with the patient and are negative.  PHYSICAL EXAMINATION: ECOG PERFORMANCE STATUS: 1 - Symptomatic but completely ambulatory  Vitals:   10/11/19 1100 10/11/19 1157  BP: 122/78   Pulse: 100   Resp: 14   Temp:  98.7 F (37.1 C)  SpO2: 100%    Filed Weights   10/11/19 0500  Weight: 189 lb (85.7 kg)    GENERAL:alert, no distress and comfortable SKIN: skin color, texture, turgor are normal, no rashes or significant lesions EYES:  normal, conjunctiva are pink and non-injected, sclera clear OROPHARYNX:no exudate, no erythema and lips, buccal mucosa, and tongue normal  NECK: supple, thyroid normal size, non-tender, without nodularity LYMPH:  no palpable lymphadenopathy in the cervical, axillary or inguinal LUNGS: clear to auscultation and percussion with normal breathing effort HEART: regular rate & rhythm and no murmurs and no lower extremity edema ABDOMEN:abdomen soft, non-tender and normal bowel sounds Musculoskeletal:no cyanosis of digits and no clubbing  PSYCH: alert & oriented x 3 with fluent speech NEURO: no focal motor/sensory deficits  LABORATORY DATA:  I have reviewed the data as listed Lab Results  Component Value Date   WBC 6.2 10/11/2019   HGB 9.2 (L) 10/11/2019   HCT 28.0 (L) 10/11/2019   MCV 93.3 10/11/2019   PLT 132 (L) 10/11/2019   Recent Labs    10/10/19 0712 10/10/19 1319 10/11/19 0536  NA 125* 132* 134*  K 2.7* 3.3* 3.9  CL 90* 105 109  CO2 20* 18* 14*  GLUCOSE 106* 99 105*  BUN '13 11 9  '$ CREATININE 2.06* 1.66* 0.88  CALCIUM 7.7* 6.3* 7.3*  GFRNONAA 28* 37* >60  GFRAA 33* 43* >60  PROT 6.2* 4.0* 5.3*  ALBUMIN 3.4* 2.2* 2.9*  AST 49* 30 41  ALT '13 9 12  '$ ALKPHOS 133* 79 106  BILITOT 0.7 0.2* 0.5    RADIOGRAPHIC STUDIES: I have personally reviewed the radiological images as listed and agreed with the findings in the report. DG Knee 1-2 Views Right  Result Date: 10/10/2019 CLINICAL DATA:  Pain.  Recent knee arthroscopy. EXAM: RIGHT KNEE - 1-2 VIEW COMPARISON:  January 12, 2019 FINDINGS: No acute fracture or dislocation. Mild degenerative changes of the medial and patellofemoral compartments. Enthesiophyte at the quadriceps tendon insertion on the patella. There is a new well corticated ossific density projecting over the posterior joint space in comparison to prior. This may reflect a loose body versus new osteophyte. No area of erosion or osseous  destruction. No unexpected  radiopaque foreign body. Soft tissues are unremarkable. IMPRESSION: 1. New well corticated ossific density projecting over the posterior joint space in comparison to prior. This may reflect a loose body versus new osteophyte. 2. Mild medial and patellofemoral compartment osteoarthritis. Electronically Signed   By: Valentino Saxon MD   On: 10/10/2019 09:17   CT HEAD WO CONTRAST  Result Date: 10/10/2019 CLINICAL DATA:  Mental status change EXAM: CT HEAD WITHOUT CONTRAST TECHNIQUE: Contiguous axial images were obtained from the base of the skull through the vertex without intravenous contrast. COMPARISON:  None. FINDINGS: Brain: There is no acute intracranial hemorrhage, mass effect, or edema. Gray-white differentiation is preserved. There is no extra-axial fluid collection. Ventricles and sulci are within normal limits in size and configuration. Patchy hypoattenuation in the supratentorial white matter is nonspecific but may reflect mild early microvascular ischemic changes or gliosis/demyelination of other etiologies. Vascular: No hyperdense vessel or unexpected calcification. Skull: Calvarium is unremarkable. Sinuses/Orbits: No acute finding. Other: None. IMPRESSION: No acute intracranial hemorrhage, mass effect, or evidence of acute infarction. Electronically Signed   By: Macy Mis M.D.   On: 10/10/2019 15:04   CT ANGIO CHEST PE W OR WO CONTRAST  Result Date: 10/11/2019 CLINICAL DATA:  44 year old presenting with multiple recent syncopal episodes, three-month history of nausea and vomiting, 2 week history of diarrhea, and acute severe generalized abdominal pain. Elevated D-dimer. EXAM: CT ANGIOGRAPHY CHEST CT ABDOMEN AND PELVIS WITH CONTRAST TECHNIQUE: Multidetector CT imaging of the chest was performed using the standard protocol during bolus administration of intravenous contrast. Multiplanar CT image reconstructions and MIPs were obtained to evaluate the vascular anatomy. Multidetector CT imaging  of the abdomen and pelvis was performed using the standard protocol during bolus administration of intravenous contrast. CONTRAST:  167m OMNIPAQUE IOHEXOL 350 MG/ML IV. COMPARISON:  CT abdomen and pelvis 03/20/2018 and earlier. No prior chest CT. FINDINGS: CTA CHEST FINDINGS Cardiovascular: Contrast opacification of the pulmonary arteries is very good. Respiratory motion blurred images in the mid and upper lungs. Overall, the study is of good diagnostic quality. No filling defects within either main pulmonary artery or their segmental branches in either lung to suggest pulmonary embolism. Normal heart size. No pericardial effusion. No visible coronary atherosclerosis. No visible atherosclerosis involving the thoracic or proximal abdominal aorta or their visible branches. No evidence of aneurysm. Mediastinum/Nodes: Numerous normal sized lymph nodes throughout the mediastinum and in both hila. No significant lymphadenopathy. Normal appearing esophagus. Visualized thyroid gland normal in appearance. Lungs/Pleura: 3 mm nodule in the RIGHT MIDDLE LOBE (8/80). No pulmonary parenchymal nodules or masses elsewhere in either lung. No confluent or ground-glass airspace consolidation. No evidence of interstitial lung disease. Central airways patent without significant bronchial wall thickening. No pleural effusions. Musculoskeletal: Mild degenerative disc disease and spondylosis involving the UPPER and lower thoracic spine. No acute findings. Review of the MIP images confirms the above findings. CT ABDOMEN and PELVIS FINDINGS Hepatobiliary: Borderline to mild hepatomegaly. Diffuse hepatic steatosis without focal hepatic parenchymal abnormality. Gallbladder normal in appearance without calcified gallstones. No biliary ductal dilation. Pancreas: Normal in appearance without evidence of mass, ductal dilation, or inflammation. Spleen: Normal in size and appearance. Adrenals/Urinary Tract: Normal appearing adrenal glands. Kidneys  normal in size and appearance without focal parenchymal abnormality. No hydronephrosis. No evidence of urinary tract calculi. Decompressed urinary bladder with mild circumferential wall thickening which is somewhat out of proportion to that expected for the decompressed state. Stomach/Bowel: Stomach normal in appearance for the degree of  distention. Normal-appearing small bowel. Tortuous and redundant sigmoid colon expected colonic stool burden. Wall thickening involving the cecum, ascending colon and transverse colon. Normal appendix in the RIGHT mid and upper pelvis. Vascular/Lymphatic: No visible aortoiliofemoral atherosclerosis. Widely patent visceral arteries. Normal-appearing portal venous and systemic venous systems. No pathologic lymphadenopathy. Reproductive: Normal-appearing uterus and ovaries without evidence of adnexal mass. Other: Very small supraumbilical midline ANTERIOR abdominal wall hernia containing fat and fluid. Musculoskeletal: Regional skeleton unremarkable without acute or significant osseous abnormality. IMPRESSION: 1. No evidence of pulmonary embolism. 2. No acute cardiopulmonary disease. 3. 3 mm nodule involving the RIGHT MIDDLE LOBE. No follow-up needed if patient is low-risk. Non-contrast chest CT can be considered in 12 months if patient is high-risk. This recommendation follows the consensus statement: Guidelines for Management of Incidental Pulmonary Nodules Detected on CT Images: From the Fleischner Society 2017; Radiology 2017; 284:228-243. 4. Wall thickening involving the cecum, ascending colon and transverse colon, possibly indicating colitis. 5. Borderline to mild hepatomegaly. Diffuse hepatic steatosis without focal hepatic parenchymal abnormality. 6. Very small supraumbilical midline ANTERIOR abdominal wall hernia containing fat and fluid. 7. Mild circumferential wall thickening involving the urinary bladder which is somewhat out of proportion to that expected for the  decompressed state, unchanged since the CT in January, 2020, query cystitis (interstitial cystitis?). Electronically Signed   By: Evangeline Dakin M.D.   On: 10/11/2019 14:22   CT ABDOMEN PELVIS W CONTRAST  Result Date: 10/11/2019 CLINICAL DATA:  44 year old presenting with multiple recent syncopal episodes, three-month history of nausea and vomiting, 2 week history of diarrhea, and acute severe generalized abdominal pain. Elevated D-dimer. EXAM: CT ANGIOGRAPHY CHEST CT ABDOMEN AND PELVIS WITH CONTRAST TECHNIQUE: Multidetector CT imaging of the chest was performed using the standard protocol during bolus administration of intravenous contrast. Multiplanar CT image reconstructions and MIPs were obtained to evaluate the vascular anatomy. Multidetector CT imaging of the abdomen and pelvis was performed using the standard protocol during bolus administration of intravenous contrast. CONTRAST:  168m OMNIPAQUE IOHEXOL 350 MG/ML IV. COMPARISON:  CT abdomen and pelvis 03/20/2018 and earlier. No prior chest CT. FINDINGS: CTA CHEST FINDINGS Cardiovascular: Contrast opacification of the pulmonary arteries is very good. Respiratory motion blurred images in the mid and upper lungs. Overall, the study is of good diagnostic quality. No filling defects within either main pulmonary artery or their segmental branches in either lung to suggest pulmonary embolism. Normal heart size. No pericardial effusion. No visible coronary atherosclerosis. No visible atherosclerosis involving the thoracic or proximal abdominal aorta or their visible branches. No evidence of aneurysm. Mediastinum/Nodes: Numerous normal sized lymph nodes throughout the mediastinum and in both hila. No significant lymphadenopathy. Normal appearing esophagus. Visualized thyroid gland normal in appearance. Lungs/Pleura: 3 mm nodule in the RIGHT MIDDLE LOBE (8/80). No pulmonary parenchymal nodules or masses elsewhere in either lung. No confluent or ground-glass  airspace consolidation. No evidence of interstitial lung disease. Central airways patent without significant bronchial wall thickening. No pleural effusions. Musculoskeletal: Mild degenerative disc disease and spondylosis involving the UPPER and lower thoracic spine. No acute findings. Review of the MIP images confirms the above findings. CT ABDOMEN and PELVIS FINDINGS Hepatobiliary: Borderline to mild hepatomegaly. Diffuse hepatic steatosis without focal hepatic parenchymal abnormality. Gallbladder normal in appearance without calcified gallstones. No biliary ductal dilation. Pancreas: Normal in appearance without evidence of mass, ductal dilation, or inflammation. Spleen: Normal in size and appearance. Adrenals/Urinary Tract: Normal appearing adrenal glands. Kidneys normal in size and appearance without focal parenchymal abnormality. No hydronephrosis.  No evidence of urinary tract calculi. Decompressed urinary bladder with mild circumferential wall thickening which is somewhat out of proportion to that expected for the decompressed state. Stomach/Bowel: Stomach normal in appearance for the degree of distention. Normal-appearing small bowel. Tortuous and redundant sigmoid colon expected colonic stool burden. Wall thickening involving the cecum, ascending colon and transverse colon. Normal appendix in the RIGHT mid and upper pelvis. Vascular/Lymphatic: No visible aortoiliofemoral atherosclerosis. Widely patent visceral arteries. Normal-appearing portal venous and systemic venous systems. No pathologic lymphadenopathy. Reproductive: Normal-appearing uterus and ovaries without evidence of adnexal mass. Other: Very small supraumbilical midline ANTERIOR abdominal wall hernia containing fat and fluid. Musculoskeletal: Regional skeleton unremarkable without acute or significant osseous abnormality. IMPRESSION: 1. No evidence of pulmonary embolism. 2. No acute cardiopulmonary disease. 3. 3 mm nodule involving the RIGHT  MIDDLE LOBE. No follow-up needed if patient is low-risk. Non-contrast chest CT can be considered in 12 months if patient is high-risk. This recommendation follows the consensus statement: Guidelines for Management of Incidental Pulmonary Nodules Detected on CT Images: From the Fleischner Society 2017; Radiology 2017; 284:228-243. 4. Wall thickening involving the cecum, ascending colon and transverse colon, possibly indicating colitis. 5. Borderline to mild hepatomegaly. Diffuse hepatic steatosis without focal hepatic parenchymal abnormality. 6. Very small supraumbilical midline ANTERIOR abdominal wall hernia containing fat and fluid. 7. Mild circumferential wall thickening involving the urinary bladder which is somewhat out of proportion to that expected for the decompressed state, unchanged since the CT in January, 2020, query cystitis (interstitial cystitis?). Electronically Signed   By: Evangeline Dakin M.D.   On: 10/11/2019 14:22   DG CHEST PORT 1 VIEW  Result Date: 10/10/2019 CLINICAL DATA:  Hypotension. EXAM: PORTABLE CHEST 1 VIEW COMPARISON:  None. FINDINGS: The heart size and mediastinal contours are within normal limits. Both lungs are clear. The visualized skeletal structures are unremarkable. IMPRESSION: No active disease. Electronically Signed   By: Marijo Conception M.D.   On: 10/10/2019 16:42   DG Abd 2 Views  Result Date: 10/10/2019 CLINICAL DATA:  Abdominal tenderness with nausea and vomiting EXAM: ABDOMEN - 2 VIEW COMPARISON:  Bowel FINDINGS: Bowel gas pattern is unremarkable. There are no significantly dilated loops. No significant stool burden. No air-fluid levels or free air. IMPRESSION: Unremarkable bowel gas pattern. Electronically Signed   By: Macy Mis M.D.   On: 10/10/2019 14:12   ECHOCARDIOGRAM COMPLETE  Result Date: 10/10/2019    ECHOCARDIOGRAM REPORT   Patient Name:   Kristin [name] Date of Exam: 10/10/2019 Medical Rec #:  [id number]      Height:       64.5 in Accession #:     [id number]     Weight:       169.5 lb Date of Birth:  1975-05-22     BSA:          1.833 m Patient Age:    25 years       BP:           128/86 mmHg Patient Gender: F              HR:           90 bpm. Exam Location:  Inpatient Procedure: 2D Echo, Cardiac Doppler and Color Doppler Indications:    Syncope 780.2 / R55  History:        Patient has no prior history of Echocardiogram examinations.                 Signs/Symptoms:Syncope;  Risk Factors:Hypertension and Current                 Smoker.  Sonographer:    Vickie Epley RDCS Referring Phys: [id number] [facility] LATIF [place]  1. Left ventricular ejection fraction, by estimation, is 55 to 60%. The left ventricle has normal function. The left ventricle has no regional wall motion abnormalities. Left ventricular diastolic parameters were normal.  2. Right ventricular systolic function is normal. The right ventricular size is normal.  3. The mitral valve is normal in structure. No evidence of mitral valve regurgitation. No evidence of mitral stenosis.  4. The aortic valve is normal in structure. Aortic valve regurgitation is not visualized. No aortic stenosis is present.  5. The inferior vena cava is normal in size with greater than 50% respiratory variability, suggesting right atrial pressure of 3 mmHg. FINDINGS  Left Ventricle: Left ventricular ejection fraction, by estimation, is 55 to 60%. The left ventricle has normal function. The left ventricle has no regional wall motion abnormalities. The left ventricular internal cavity size was normal in size. There is  no left ventricular hypertrophy. Left ventricular diastolic parameters were normal. Normal left ventricular filling pressure. Right Ventricle: The right ventricular size is normal. No increase in right ventricular wall thickness. Right ventricular systolic function is normal. Left Atrium: Left atrial size was not well visualized. Right Atrium: Right atrial size was not well visualized. Pericardium: There  is no evidence of pericardial effusion. Mitral Valve: The mitral valve is normal in structure. Normal mobility of the mitral valve leaflets. No evidence of mitral valve regurgitation. No evidence of mitral valve stenosis. Tricuspid Valve: The tricuspid valve is normal in structure. Tricuspid valve regurgitation is trivial. No evidence of tricuspid stenosis. Aortic Valve: The aortic valve is normal in structure. Aortic valve regurgitation is not visualized. No aortic stenosis is present. Pulmonic Valve: The pulmonic valve was normal in structure. Pulmonic valve regurgitation is not visualized. No evidence of pulmonic stenosis. Aorta: The aortic root is normal in size and structure. Venous: The inferior vena cava is normal in size with greater than 50% respiratory variability, suggesting right atrial pressure of 3 mmHg. IAS/Shunts: No atrial level shunt detected by color flow Doppler.  LEFT VENTRICLE PLAX 2D LVIDd:         5.24 cm      Diastology LVIDs:         3.80 cm      LV e' lateral:   13.30 cm/s LV PW:         0.68 cm      LV E/e' lateral: 3.8 LV IVS:        0.70 cm      LV e' medial:    9.03 cm/s LVOT diam:     2.10 cm      LV E/e' medial:  5.6 LV SV:         62 LV SV Index:   34 LVOT Area:     3.46 cm  LV Volumes (MOD) LV vol d, MOD A2C: 107.0 ml LV vol d, MOD A4C: 128.0 ml LV vol s, MOD A2C: 57.4 ml LV vol s, MOD A4C: 58.0 ml LV SV MOD A2C:     49.6 ml LV SV MOD A4C:     128.0 ml LV SV MOD BP:      60.5 ml RIGHT VENTRICLE RV S prime:     11.60 cm/s TAPSE (M-mode): 1.6 cm LEFT ATRIUM  Index       RIGHT ATRIUM          Index LA diam:        2.80 cm 1.53 cm/m  RA Area:     7.33 cm LA Vol (A2C):   16.9 ml 9.22 ml/m  RA Volume:   11.70 ml 6.38 ml/m LA Vol (A4C):   21.7 ml 11.84 ml/m LA Biplane Vol: 21.2 ml 11.56 ml/m  AORTIC VALVE LVOT Vmax:   106.00 cm/s LVOT Vmean:  72.300 cm/s LVOT VTI:    0.179 m  AORTA Ao Root diam: 3.30 cm MITRAL VALVE MV Area (PHT): 4.06 cm    SHUNTS MV Decel Time: 187  msec    Systemic VTI:  0.18 m MV E velocity: 50.90 cm/s  Systemic Diam: 2.10 cm MV A velocity: 51.40 cm/s MV E/A ratio:  0.99 Fransico Him MD Electronically signed by Fransico Him MD Signature Date/Time: 10/10/2019/12:52:28 PM    Final    US Abdomen Limited RUQ  Result Date: 10/10/2019 CLINICAL DATA:  Abdominal pain EXAM: ULTRASOUND ABDOMEN LIMITED RIGHT UPPER QUADRANT COMPARISON:  03/20/2018 FINDINGS: Gallbladder: Under distended gallbladder without wall thickening. There is dependent sludge. No shadowing gallstone or focal tenderness. Common bile duct: Diameter: 6 mm Liver: Mildly echogenic. No focal hepatic lesion. Portal vein is patent on color Doppler imaging with normal direction of blood flow towards the liver. IMPRESSION: 1. Gallbladder sludge.  No evidence of cholecystitis. 2. Hepatic steatosis. Electronically Signed   By: Monte Fantasia M.D.   On: 10/10/2019 14:44    ASSESSMENT & PLAN:  44 yo female   1.  Syncope secondary to hypotension and dehydration 2.  Severe anemia with hemoglobin 5.2, s/p 2 units blood transfusion, normocytic and hypoproductive  3. Folate deficiency due to alcohol drinking  4. Nausea, vomiting, GERD 5. Diarrhea, colitis, pending ID work up  6. Recent right knee surgery  7.  Anxiety, depression and bipolar  Recommendations: -Her anemia work-up showed low reticulocyte count, normal MCV, elevated ferritin and W97, low folic acid level, no lab evidence of hemolysis. -He is a folate deficiency is related to her alcohol abuse, I will start her on folic acid 1 mg daily -His iron study also indicating anemia of chronic disease, possible related to her esophagitis and colitis. -I reviewed her peripheral blood smear, which was unremarkable -Her GERD, esophagitis, colitis may also contribute to her anemia due to malabsorption, GI on board  -CT scan reviewed with pt, which showed no evidence of internal bleeding, it does show evidence of colitis, mild hepatomegaly with  hepatic steatosis.  I discussed with patient, and strongly encouraged her to quit alcohol completely -will hold on bone marrow biopsy for now -I will set up a follow up lab and office visit with me in 2-3 weeks. If her anemia persist with adequate folate acid replacement, and adequate treatment for esophagitis, GERD and colitis, I may consider bone marrow biopsy down the road. -I recommend her to take prenatal vitamin once daily upon discharge.   All questions were answered. The patient knows to call the clinic with any problems, questions or concerns.      Truitt Merle, MD 10/11/2019 2:59 PM

## 2019-10-12 DIAGNOSIS — R945 Abnormal results of liver function studies: Secondary | ICD-10-CM

## 2019-10-12 LAB — CBC WITH DIFFERENTIAL/PLATELET
Abs Immature Granulocytes: 0.07 10*3/uL (ref 0.00–0.07)
Basophils Absolute: 0 10*3/uL (ref 0.0–0.1)
Basophils Relative: 1 %
Eosinophils Absolute: 0.2 10*3/uL (ref 0.0–0.5)
Eosinophils Relative: 3 %
HCT: 25.1 % — ABNORMAL LOW (ref 36.0–46.0)
Hemoglobin: 8.6 g/dL — ABNORMAL LOW (ref 12.0–15.0)
Immature Granulocytes: 1 %
Lymphocytes Relative: 19 %
Lymphs Abs: 1.1 10*3/uL (ref 0.7–4.0)
MCH: 30.3 pg (ref 26.0–34.0)
MCHC: 34.3 g/dL (ref 30.0–36.0)
MCV: 88.4 fL (ref 80.0–100.0)
Monocytes Absolute: 1.2 10*3/uL — ABNORMAL HIGH (ref 0.1–1.0)
Monocytes Relative: 19 %
Neutro Abs: 3.4 10*3/uL (ref 1.7–7.7)
Neutrophils Relative %: 57 %
Platelets: 149 10*3/uL — ABNORMAL LOW (ref 150–400)
RBC: 2.84 MIL/uL — ABNORMAL LOW (ref 3.87–5.11)
RDW: 14.6 % (ref 11.5–15.5)
WBC: 5.9 10*3/uL (ref 4.0–10.5)
nRBC: 0 % (ref 0.0–0.2)

## 2019-10-12 LAB — GASTROINTESTINAL PANEL BY PCR, STOOL (REPLACES STOOL CULTURE)

## 2019-10-12 LAB — BPAM RBC
Blood Product Expiration Date: 202109042359
Blood Product Expiration Date: 202109042359
ISSUE DATE / TIME: 202107301925
ISSUE DATE / TIME: 202107310006
Unit Type and Rh: 5100
Unit Type and Rh: 5100

## 2019-10-12 LAB — COMPREHENSIVE METABOLIC PANEL
ALT: 10 U/L (ref 0–44)
AST: 33 U/L (ref 15–41)
Albumin: 2.7 g/dL — ABNORMAL LOW (ref 3.5–5.0)
Alkaline Phosphatase: 93 U/L (ref 38–126)
Anion gap: 8 (ref 5–15)
BUN: <5 mg/dL — ABNORMAL LOW (ref 6–20)
CO2: 23 mmol/L (ref 22–32)
Calcium: 8.3 mg/dL — ABNORMAL LOW (ref 8.9–10.3)
Chloride: 106 mmol/L (ref 98–111)
Creatinine, Ser: 0.51 mg/dL (ref 0.44–1.00)
GFR calc Af Amer: >60 mL/min (ref >60)
GFR calc non Af Amer: >60 mL/min (ref >60)
Glucose, Bld: 92 mg/dL (ref 70–99)
Potassium: 4.1 mmol/L (ref 3.5–5.1)
Sodium: 137 mmol/L (ref 135–145)
Total Bilirubin: 0.5 mg/dL (ref 0.3–1.2)
Total Protein: 5.1 g/dL — ABNORMAL LOW (ref 6.5–8.1)

## 2019-10-12 LAB — TYPE AND SCREEN
ABO/RH(D): O POS
Antibody Screen: NEGATIVE
Unit division: 0
Unit division: 0

## 2019-10-12 LAB — HEMOGLOBIN AND HEMATOCRIT, BLOOD
HCT: 28 % — ABNORMAL LOW (ref 36.0–46.0)
Hemoglobin: 9.4 g/dL — ABNORMAL LOW (ref 12.0–15.0)

## 2019-10-12 LAB — PHOSPHORUS: Phosphorus: 3.3 mg/dL (ref 2.5–4.6)

## 2019-10-12 LAB — MAGNESIUM: Magnesium: 1.8 mg/dL (ref 1.7–2.4)

## 2019-10-12 MED ORDER — ADULT MULTIVITAMIN W/MINERALS CH: 1.0000 | ORAL_TABLET | Freq: Every day | ORAL | Status: DC

## 2019-10-12 MED ORDER — PANTOPRAZOLE SODIUM 40 MG PO TBEC: 40.0000 mg | DELAYED_RELEASE_TABLET | Freq: Every day | ORAL | 1 refills | Status: DC

## 2019-10-12 MED ORDER — THIAMINE HCL 100 MG PO TABS: 100.0000 mg | ORAL_TABLET | Freq: Every day | ORAL | 0 refills | Status: DC

## 2019-10-12 MED ORDER — PANTOPRAZOLE SODIUM 40 MG PO TBEC
40.0000 mg | DELAYED_RELEASE_TABLET | Freq: Every day | ORAL | Status: DC
  Administered 2019-10-13 – 2019-10-14 (×2): 40 mg via ORAL
  Filled 2019-10-12 (×2): qty 1

## 2019-10-12 MED ORDER — HALOPERIDOL LACTATE 5 MG/ML IJ SOLN: 2.0000 mg | Freq: Four times a day (QID) | INTRAMUSCULAR | Status: DC | PRN

## 2019-10-12 MED ORDER — FOLIC ACID 1 MG PO TABS: 1.0000 mg | ORAL_TABLET | Freq: Every day | ORAL | 0 refills | Status: DC

## 2019-10-12 MED ORDER — POLYETHYLENE GLYCOL 3350 17 G PO PACK: 17.0000 g | PACK | Freq: Every day | ORAL | 0 refills | Status: DC | PRN

## 2019-10-12 NOTE — Progress Notes (Signed)
PROGRESS NOTE    Kristin [name]  JJO:[id number] DOB: Jul 20, 1975 DOA: 10/10/2019 PCP: System, Pcp Not In    Chief Complaint  Patient presents with  . Hypotension    Brief Narrative: 44 year old lady prior history of GERD, recent acute lateral meniscus tear of the right knee s/p repair on the 09/30/2019, anxiety, depression, bipolar disorder, hypertension, alcohol abuse presents with syncope associated with nausea vomiting diarrhea and fatigue mostly after recent knee procedure pain.  On arrival to ED she was found to have a hemoglobin of 5 underwent 2 units of PRBC transfusion and hemoglobin has improved to 9.  GI was consulted for evaluation of anemia of unclear etiology.  GI recommended outpatient follow-up with an EGD and colonoscopy.  Her stool for occult blood is negative at this time. Assessment & Plan:   Active Problems:   Osteoarthritis of right knee   AKI (acute kidney injury) ([place])   Bipolar disorder in remission ([place])   Dehydration   Esophagitis determined by endoscopy   Syncope   Anemia  Syncope Probably secondary to hypotension, and severe anemia. Further work-up for syncope revealed unremarkable echocardiogram.  Troponins have been minimally elevated but EKG does not show any significant ischemic changes. CT of the head without contrast does not show any acute intracranial abnormalities. Orthostatic vital signs this morning have been negative so far. EEG was ordered but patient refused.,  TSH was within normal limits. She received a total of 6 L of IV fluids and 2 units of PRBC transfusion and her hypotension has resolved. CT of the abdomen and pelvis did not reveal any retroperitoneal bleeding.  It shows Wall thickening involving the cecum, ascending colon and transverse colon, possibly indicating colitis.  Borderline to mild hepatomegaly. Diffuse hepatic steatosis without focal hepatic parenchymal abnormality. Patient has refused to weigh daily and it was  removed.    Acute anemia of blood loss probably secondary to GI source due to her history of gastritis and folic acid deficiency.  She underwent an EGD in 2019 which showed erosive esophagitis, chronic gastritis chronic duodenitis consistent with peptic duodenitis. Pt also reports using NSAIDS in addition to aspirin use int he last two weeks for DVT prophylaxis for the knee repair.  S/p 2 units of PRBC transfusion her hemoglobin trending at 5.2 to 6.5 to 9.2 to 8.6 to 9.4.  Dr Watt Climes  deferred the use of aspirin ( for DVT prophylaxis for the knee surgery ) to orthopedics.  GI recommended outpatient EGD and colonoscopy.  Hematology consulted, suggested that her iron studies indicates anemia of chronic disease probably relating to esophagitis, malabsorption, colitis..  Her peripheral smear was unremarkable. No indication for bone marrow biopsy at this time.  Recommended outpatient follow-up in the office in 2 to 3 weeks and if anemia still persistent with folic acid replacement and with adequate treatment of esophagitis, GERD, colitis then bone marrow biopsy may be indicated down the road.   GERD continue with PPI daily.   Colitis of the cecum, ascending colon and transverse colon Patient was started on IV Rocephin and Flagyl. Patient denies any nausea vomiting and diarrhea at this time. C. difficile PCR is negative on admission. GI pathogen PCR still pending. Patient is currently afebrile and WBC count within normal limits.  Alcohol abuse Patient appears to be confused, tachycardic earlier this morning and tachypneic and slightly agitated.  Patient was trying to leave the hospital and IVC orders had been placed.  Patient is currently on CIWA protocol and IV  Haldol ordered for intermittent agitation and confusion. CT ABD shows diffuse hepatic steatosis.    Bipolar disorder Continue with Zyprexa.   AKI Probably secondary to dehydration and poor renal perfusion Resolved with IV  fluids.   Right knee meniscal tear Repair on 09/30/2019.   Hyponatremia, Resolved.   Hypokalemia Replaced.    Mild metabolic acidosis Resolved with bicarbonate infusion   DVT prophylaxis: scd's Code Status: full code.  Family Communication: none at bedside.  Disposition:   Status is: Inpatient  Remains inpatient appropriate because:Ongoing diagnostic testing needed not appropriate for outpatient work up, Unsafe d/c plan and Inpatient level of care appropriate due to severity of illness   Dispo: The patient is from: Home              Anticipated d/c is to: Home              Anticipated d/c date is: 1 day              Patient currently is not medically stable to d/c.       Consultants:   Gastroenterology  Hematology  orthopedics.   Procedures: none.   Antimicrobials:  Anti-infectives (From admission, onward)   Start     Dose/Rate Route Frequency Ordered Stop   10/11/19 1800  cefTRIAXone (ROCEPHIN) 2 g in sodium chloride 0.9 % 100 mL IVPB     Discontinue     2 g 200 mL/hr over 30 Minutes Intravenous Every 24 hours 10/11/19 1540     10/11/19 1800  metroNIDAZOLE (FLAGYL) IVPB 500 mg     Discontinue     500 mg 100 mL/hr over 60 Minutes Intravenous Every 8 hours 10/11/19 1540            Subjective: Pt wants to go home, is adamant about going home. Had to call the daughter to convince her to stay.  She refused to labs this evening. Does not want to keep telemetry on.  Objective: Vitals:   10/12/19 0158 10/12/19 0457 10/12/19 1337 10/12/19 1340  BP: (!) 126/98 (!) 121/87 (!) 134/96 (!) 141/107  Pulse: 88 90 75 75  Resp: 18 18    Temp: 97.9 F (36.6 C) 98.1 F (36.7 C)    TempSrc: Oral Oral    SpO2: 100% 100%    Weight:        Intake/Output Summary (Last 24 hours) at 10/12/2019 1437 Last data filed at 10/12/2019 0535 Gross per 24 hour  Intake 957.58 ml  Output --  Net 957.58 ml   Filed Weights   10/11/19 0500  Weight: 85.7 kg     Examination:  General exam: Appears calm and comfortable  Respiratory system: Clear to auscultation. Respiratory effort normal. Cardiovascular system: S1 & S2 heard, RRR. Marland Kitchen No pedal edema. Gastrointestinal system: Abdomen is nondistended, soft and nontender.Normal bowel sounds heard. Central nervous system: Alert and oriented to person only. Confused.  Extremities: clear bandage on the right knee.  Skin: No rashes, lesions or ulcers Psychiatry: Mood & affect appropriate.     Data Reviewed: I have personally reviewed following labs and imaging studies  CBC: Recent Labs  Lab 10/10/19 0712 10/10/19 1319 10/10/19 1520 10/11/19 0536 10/12/19 0436  WBC 7.2 5.1  --  6.2 5.9  NEUTROABS 4.9 2.9  --  4.1 3.4  HGB 7.8* 5.2* 6.5* 9.2* 8.6*  HCT 23.5* 15.8* 19.8* 28.0* 25.1*  MCV 91.8 94.6  --  93.3 88.4  PLT 175 114*  --  132* 149*  Basic Metabolic Panel: Recent Labs  Lab 10/10/19 0712 10/10/19 1319 10/11/19 0536 10/12/19 0436  NA 125* 132* 134* 137  K 2.7* 3.3* 3.9 4.1  CL 90* 105 109 106  CO2 20* 18* 14* 23  GLUCOSE 106* 99 105* 92  BUN '13 11 9 '$ <5*  CREATININE 2.06* 1.66* 0.88 0.51  CALCIUM 7.7* 6.3* 7.3* 8.3*  MG  --  1.1* 1.9 1.8  PHOS  --  3.2 2.9 3.3    GFR: Estimated Creatinine Clearance: 95 mL/min (by C-G formula based on SCr of 0.51 mg/dL).  Liver Function Tests: Recent Labs  Lab 10/10/19 6761 10/10/19 1319 10/11/19 0536 10/12/19 0436  AST 49* 30 41 33  ALT '13 9 12 10  '$ ALKPHOS 133* 79 106 93  BILITOT 0.7 0.2* 0.5 0.5  PROT 6.2* 4.0* 5.3* 5.1*  ALBUMIN 3.4* 2.2* 2.9* 2.7*    CBG: Recent Labs  Lab 10/10/19 1303 10/11/19 0804  GLUCAP 132* 117*     Recent Results (from the past 240 hour(s))  Culture, group A strep (throat)     Status: None   Collection Time: 10/05/19  5:52 PM   Specimen: Throat  Result Value Ref Range Status   Specimen Description THROAT  Final   Special Requests NONE  Final   Culture   Final    NO GROUP A STREP  (S.PYOGENES) ISOLATED Performed at [facility], [place] [address] [place], [place] [address]    Report Status 10/08/2019 FINAL  Final  SARS Coronavirus 2 by RT PCR (hospital order, performed in [facility] hospital lab) Nasopharyngeal Nasopharyngeal Swab     Status: None   Collection Time: 10/10/19  8:33 AM   Specimen: Nasopharyngeal Swab  Result Value Ref Range Status   SARS Coronavirus 2 NEGATIVE NEGATIVE Final    Comment: (NOTE) SARS-CoV-2 target nucleic acids are NOT DETECTED.  The SARS-CoV-2 RNA is generally detectable in upper and lower respiratory specimens during the acute phase of infection. The lowest concentration of SARS-CoV-2 viral copies this assay can detect is 250 copies / mL. A negative result does not preclude SARS-CoV-2 infection and should not be used as the sole basis for treatment or other patient management decisions.  A negative result may occur with improper specimen collection / handling, submission of specimen other than nasopharyngeal swab, presence of viral mutation(s) within the areas targeted by this assay, and inadequate number of viral copies (<250 copies / mL). A negative result must be combined with clinical observations, patient history, and epidemiological information.  Fact Sheet for Patients:   StrictlyIdeas.no  Fact Sheet for Healthcare Providers: BankingDealers.co.za  This test is not yet approved or  cleared by the [place] FDA and has been authorized for detection and/or diagnosis of SARS-CoV-2 by FDA under an Emergency Use Authorization (EUA).  This EUA will remain in effect (meaning this test can be used) for the duration of the COVID-19 declaration under Section 564(b)(1) of the Act, 21 U.S.C. section 360bbb-3(b)(1), unless the authorization is terminated or revoked sooner.  Performed at [facility], [place] [address] [place], [place] [address]   Culture,  blood (routine x 2)     Status: None (Preliminary result)   Collection Time: 10/10/19  1:57 PM   Specimen: BLOOD  Result Value Ref Range Status   Specimen Description   Final    BLOOD RIGHT ANTECUBITAL Performed at [facility] [address], [place], [place] [address]    Special Requests  Final    BOTTLES DRAWN AEROBIC AND ANAEROBIC Blood Culture adequate volume Performed at [facility] [address] [place], [place] [address]    Culture   Final    NO GROWTH 2 DAYS Performed at [facility] [address] [place], [place] [address]    Report Status PENDING  Incomplete  Culture, blood (routine x 2)     Status: None (Preliminary result)   Collection Time: 10/10/19  1:57 PM   Specimen: BLOOD  Result Value Ref Range Status   Specimen Description   Final    BLOOD LEFT ANTECUBITAL Performed at [facility] [address] [place], [place] [address]    Special Requests   Final    BOTTLES DRAWN AEROBIC AND ANAEROBIC Blood Culture adequate volume Performed at [facility] [address] [place], [place] [address]    Culture   Final    NO GROWTH 2 DAYS Performed at [facility] [address] [place], [place] [address]    Report Status PENDING  Incomplete  MRSA PCR Screening     Status: None   Collection Time: 10/10/19 10:45 PM   Specimen: Nasopharyngeal  Result Value Ref Range Status   MRSA by PCR NEGATIVE NEGATIVE Final    Comment:        The GeneXpert MRSA Assay (FDA approved for NASAL specimens only), is one component of a comprehensive MRSA colonization surveillance program. It is not intended to diagnose MRSA infection nor to guide or monitor treatment for MRSA infections. Performed at [facility], [place] [address] [place], [place] [address]   C Difficile Quick Screen w PCR reflex     Status: None   Collection Time: 10/11/19  4:05 PM   Specimen:  Rectum; Stool  Result Value Ref Range Status   C Diff antigen NEGATIVE NEGATIVE Final   C Diff toxin NEGATIVE NEGATIVE Final   C Diff interpretation No C. difficile detected.  Final    Comment: Performed at [facility], [place] [address], [place], [place] [address]         Radiology Studies: CT HEAD WO CONTRAST  Result Date: 10/10/2019 CLINICAL DATA:  Mental status change EXAM: CT HEAD WITHOUT CONTRAST TECHNIQUE: Contiguous axial images were obtained from the base of the skull through the vertex without intravenous contrast. COMPARISON:  None. FINDINGS: Brain: There is no acute intracranial hemorrhage, mass effect, or edema. Gray-white differentiation is preserved. There is no extra-axial fluid collection. Ventricles and sulci are within normal limits in size and configuration. Patchy hypoattenuation in the supratentorial white matter is nonspecific but may reflect mild early microvascular ischemic changes or gliosis/demyelination of other etiologies. Vascular: No hyperdense vessel or unexpected calcification. Skull: Calvarium is unremarkable. Sinuses/Orbits: No acute finding. Other: None. IMPRESSION: No acute intracranial hemorrhage, mass effect, or evidence of acute infarction. Electronically Signed   By: Macy Mis M.D.   On: 10/10/2019 15:04   CT ANGIO CHEST PE W OR WO CONTRAST  Result Date: 10/11/2019 CLINICAL DATA:  44 year old presenting with multiple recent syncopal episodes, three-month history of nausea and vomiting, 2 week history of diarrhea, and acute severe generalized abdominal pain. Elevated D-dimer. EXAM: CT ANGIOGRAPHY CHEST CT ABDOMEN AND PELVIS WITH CONTRAST TECHNIQUE: Multidetector CT imaging of the chest was performed using the standard protocol during bolus administration of intravenous contrast. Multiplanar CT image reconstructions and MIPs were obtained to evaluate the vascular anatomy. Multidetector CT imaging of the  abdomen and pelvis was performed  using the standard protocol during bolus administration of intravenous contrast. CONTRAST:  164m OMNIPAQUE IOHEXOL 350 MG/ML IV. COMPARISON:  CT abdomen and pelvis 03/20/2018 and earlier. No prior chest CT. FINDINGS: CTA CHEST FINDINGS Cardiovascular: Contrast opacification of the pulmonary arteries is very good. Respiratory motion blurred images in the mid and upper lungs. Overall, the study is of good diagnostic quality. No filling defects within either main pulmonary artery or their segmental branches in either lung to suggest pulmonary embolism. Normal heart size. No pericardial effusion. No visible coronary atherosclerosis. No visible atherosclerosis involving the thoracic or proximal abdominal aorta or their visible branches. No evidence of aneurysm. Mediastinum/Nodes: Numerous normal sized lymph nodes throughout the mediastinum and in both hila. No significant lymphadenopathy. Normal appearing esophagus. Visualized thyroid gland normal in appearance. Lungs/Pleura: 3 mm nodule in the RIGHT MIDDLE LOBE (8/80). No pulmonary parenchymal nodules or masses elsewhere in either lung. No confluent or ground-glass airspace consolidation. No evidence of interstitial lung disease. Central airways patent without significant bronchial wall thickening. No pleural effusions. Musculoskeletal: Mild degenerative disc disease and spondylosis involving the UPPER and lower thoracic spine. No acute findings. Review of the MIP images confirms the above findings. CT ABDOMEN and PELVIS FINDINGS Hepatobiliary: Borderline to mild hepatomegaly. Diffuse hepatic steatosis without focal hepatic parenchymal abnormality. Gallbladder normal in appearance without calcified gallstones. No biliary ductal dilation. Pancreas: Normal in appearance without evidence of mass, ductal dilation, or inflammation. Spleen: Normal in size and appearance. Adrenals/Urinary Tract: Normal appearing adrenal glands. Kidneys normal in size and appearance without  focal parenchymal abnormality. No hydronephrosis. No evidence of urinary tract calculi. Decompressed urinary bladder with mild circumferential wall thickening which is somewhat out of proportion to that expected for the decompressed state. Stomach/Bowel: Stomach normal in appearance for the degree of distention. Normal-appearing small bowel. Tortuous and redundant sigmoid colon expected colonic stool burden. Wall thickening involving the cecum, ascending colon and transverse colon. Normal appendix in the RIGHT mid and upper pelvis. Vascular/Lymphatic: No visible aortoiliofemoral atherosclerosis. Widely patent visceral arteries. Normal-appearing portal venous and systemic venous systems. No pathologic lymphadenopathy. Reproductive: Normal-appearing uterus and ovaries without evidence of adnexal mass. Other: Very small supraumbilical midline ANTERIOR abdominal wall hernia containing fat and fluid. Musculoskeletal: Regional skeleton unremarkable without acute or significant osseous abnormality. IMPRESSION: 1. No evidence of pulmonary embolism. 2. No acute cardiopulmonary disease. 3. 3 mm nodule involving the RIGHT MIDDLE LOBE. No follow-up needed if patient is low-risk. Non-contrast chest CT can be considered in 12 months if patient is high-risk. This recommendation follows the consensus statement: Guidelines for Management of Incidental Pulmonary Nodules Detected on CT Images: From the Fleischner Society 2017; Radiology 2017; 284:228-243. 4. Wall thickening involving the cecum, ascending colon and transverse colon, possibly indicating colitis. 5. Borderline to mild hepatomegaly. Diffuse hepatic steatosis without focal hepatic parenchymal abnormality. 6. Very small supraumbilical midline ANTERIOR abdominal wall hernia containing fat and fluid. 7. Mild circumferential wall thickening involving the urinary bladder which is somewhat out of proportion to that expected for the decompressed state, unchanged since the CT in  January, 2020, query cystitis (interstitial cystitis?). Electronically Signed   By: TEvangeline DakinM.D.   On: 10/11/2019 14:22   CT ABDOMEN PELVIS W CONTRAST  Result Date: 10/11/2019 CLINICAL DATA:  44year old presenting with multiple recent syncopal episodes, three-month history of nausea and vomiting, 2 week history of diarrhea, and acute severe generalized abdominal pain. Elevated D-dimer. EXAM: CT ANGIOGRAPHY CHEST CT ABDOMEN AND PELVIS WITH CONTRAST  TECHNIQUE: Multidetector CT imaging of the chest was performed using the standard protocol during bolus administration of intravenous contrast. Multiplanar CT image reconstructions and MIPs were obtained to evaluate the vascular anatomy. Multidetector CT imaging of the abdomen and pelvis was performed using the standard protocol during bolus administration of intravenous contrast. CONTRAST:  161m OMNIPAQUE IOHEXOL 350 MG/ML IV. COMPARISON:  CT abdomen and pelvis 03/20/2018 and earlier. No prior chest CT. FINDINGS: CTA CHEST FINDINGS Cardiovascular: Contrast opacification of the pulmonary arteries is very good. Respiratory motion blurred images in the mid and upper lungs. Overall, the study is of good diagnostic quality. No filling defects within either main pulmonary artery or their segmental branches in either lung to suggest pulmonary embolism. Normal heart size. No pericardial effusion. No visible coronary atherosclerosis. No visible atherosclerosis involving the thoracic or proximal abdominal aorta or their visible branches. No evidence of aneurysm. Mediastinum/Nodes: Numerous normal sized lymph nodes throughout the mediastinum and in both hila. No significant lymphadenopathy. Normal appearing esophagus. Visualized thyroid gland normal in appearance. Lungs/Pleura: 3 mm nodule in the RIGHT MIDDLE LOBE (8/80). No pulmonary parenchymal nodules or masses elsewhere in either lung. No confluent or ground-glass airspace consolidation. No evidence of  interstitial lung disease. Central airways patent without significant bronchial wall thickening. No pleural effusions. Musculoskeletal: Mild degenerative disc disease and spondylosis involving the UPPER and lower thoracic spine. No acute findings. Review of the MIP images confirms the above findings. CT ABDOMEN and PELVIS FINDINGS Hepatobiliary: Borderline to mild hepatomegaly. Diffuse hepatic steatosis without focal hepatic parenchymal abnormality. Gallbladder normal in appearance without calcified gallstones. No biliary ductal dilation. Pancreas: Normal in appearance without evidence of mass, ductal dilation, or inflammation. Spleen: Normal in size and appearance. Adrenals/Urinary Tract: Normal appearing adrenal glands. Kidneys normal in size and appearance without focal parenchymal abnormality. No hydronephrosis. No evidence of urinary tract calculi. Decompressed urinary bladder with mild circumferential wall thickening which is somewhat out of proportion to that expected for the decompressed state. Stomach/Bowel: Stomach normal in appearance for the degree of distention. Normal-appearing small bowel. Tortuous and redundant sigmoid colon expected colonic stool burden. Wall thickening involving the cecum, ascending colon and transverse colon. Normal appendix in the RIGHT mid and upper pelvis. Vascular/Lymphatic: No visible aortoiliofemoral atherosclerosis. Widely patent visceral arteries. Normal-appearing portal venous and systemic venous systems. No pathologic lymphadenopathy. Reproductive: Normal-appearing uterus and ovaries without evidence of adnexal mass. Other: Very small supraumbilical midline ANTERIOR abdominal wall hernia containing fat and fluid. Musculoskeletal: Regional skeleton unremarkable without acute or significant osseous abnormality. IMPRESSION: 1. No evidence of pulmonary embolism. 2. No acute cardiopulmonary disease. 3. 3 mm nodule involving the RIGHT MIDDLE LOBE. No follow-up needed if  patient is low-risk. Non-contrast chest CT can be considered in 12 months if patient is high-risk. This recommendation follows the consensus statement: Guidelines for Management of Incidental Pulmonary Nodules Detected on CT Images: From the Fleischner Society 2017; Radiology 2017; 284:228-243. 4. Wall thickening involving the cecum, ascending colon and transverse colon, possibly indicating colitis. 5. Borderline to mild hepatomegaly. Diffuse hepatic steatosis without focal hepatic parenchymal abnormality. 6. Very small supraumbilical midline ANTERIOR abdominal wall hernia containing fat and fluid. 7. Mild circumferential wall thickening involving the urinary bladder which is somewhat out of proportion to that expected for the decompressed state, unchanged since the CT in January, 2020, query cystitis (interstitial cystitis?). Electronically Signed   By: TEvangeline DakinM.D.   On: 10/11/2019 14:22   DG CHEST PORT 1 VIEW  Result Date: 10/10/2019 CLINICAL DATA:  Hypotension. EXAM: PORTABLE CHEST 1 VIEW COMPARISON:  None. FINDINGS: The heart size and mediastinal contours are within normal limits. Both lungs are clear. The visualized skeletal structures are unremarkable. IMPRESSION: No active disease. Electronically Signed   By: Marijo Conception M.D.   On: 10/10/2019 16:42   US Abdomen Limited RUQ  Result Date: 10/10/2019 CLINICAL DATA:  Abdominal pain EXAM: ULTRASOUND ABDOMEN LIMITED RIGHT UPPER QUADRANT COMPARISON:  03/20/2018 FINDINGS: Gallbladder: Under distended gallbladder without wall thickening. There is dependent sludge. No shadowing gallstone or focal tenderness. Common bile duct: Diameter: 6 mm Liver: Mildly echogenic. No focal hepatic lesion. Portal vein is patent on color Doppler imaging with normal direction of blood flow towards the liver. IMPRESSION: 1. Gallbladder sludge.  No evidence of cholecystitis. 2. Hepatic steatosis. Electronically Signed   By: Monte Fantasia M.D.   On: 10/10/2019 14:44         Scheduled Meds: . sodium chloride   Intravenous Once  . folic acid  1 mg Oral Daily  . magnesium oxide  400 mg Oral Daily  . multivitamin with minerals  1 tablet Oral Daily  . OLANZapine  5 mg Oral QHS  . pantoprazole  40 mg Oral Daily  . thiamine  100 mg Oral Daily   Or  . thiamine  100 mg Intravenous Daily   Continuous Infusions: . cefTRIAXone (ROCEPHIN)  IV    . metronidazole 500 mg (10/12/19 0535)  .  sodium bicarbonate  infusion 1000 mL 75 mL/hr at 10/12/19 0316     LOS: 1 day       Hosie Poisson, MD [facility]   To contact the attending provider between 7A-7P or the covering provider during after hours 7P-7A, please log into the web site [email or web address] and access using universal [place] password for that web site. If you do not have the password, please call the hospital operator.  10/12/2019, 2:37 PM

## 2019-10-12 NOTE — Progress Notes (Signed)
Kristin [name] 10:53 AM  Subjective: Patient in good spirits no signs of bleeding says she is going home no GI complaints  Objective: Vital signs stable afebrile no acute distress abdomen is soft nontender Labs stable CT nondiagnostic Assessment: Multiple medical problems currently acute episode seemingly resolved  Plan: I discussed how patient will need outpatient colonoscopy and endoscopy and I wished her well and recommend once a day pump inhibitors for her reflux  Kristin [name]  office [phone] After 5PM or if no answer call [phone]

## 2019-10-12 NOTE — H&P (View-Only) (Signed)
Cam L Schranz 10:53 AM  Subjective: Patient in good spirits no signs of bleeding says she is going home no GI complaints  Objective: Vital signs stable afebrile no acute distress abdomen is soft nontender Labs stable CT nondiagnostic Assessment: Multiple medical problems currently acute episode seemingly resolved  Plan: I discussed how patient will need outpatient colonoscopy and endoscopy and I wished her well and recommend once a day pump inhibitors for her reflux  Jonia Oakey E  office [phone] After 5PM or if no answer call [phone] 

## 2019-10-12 NOTE — Progress Notes (Signed)
Second attempt at bedside to place PIV.  Pt requested to return later due to eating supper.

## 2019-10-13 LAB — CBC
HCT: 24.1 % — ABNORMAL LOW (ref 36.0–46.0)
Hemoglobin: 8.2 g/dL — ABNORMAL LOW (ref 12.0–15.0)
MCH: 30.7 pg (ref 26.0–34.0)
MCHC: 34 g/dL (ref 30.0–36.0)
MCV: 90.3 fL (ref 80.0–100.0)
Platelets: 160 10*3/uL (ref 150–400)
RBC: 2.67 MIL/uL — ABNORMAL LOW (ref 3.87–5.11)
RDW: 14.8 % (ref 11.5–15.5)
WBC: 4.6 10*3/uL (ref 4.0–10.5)
nRBC: 0 % (ref 0.0–0.2)

## 2019-10-13 LAB — GLUCOSE, CAPILLARY: Glucose-Capillary: 87 mg/dL (ref 70–99)

## 2019-10-13 MED ORDER — SODIUM CHLORIDE 0.9 % IV SOLN: INTRAVENOUS | Status: DC

## 2019-10-13 MED ORDER — PEG 3350-KCL-NA BICARB-NACL 420 G PO SOLR
4000.0000 mL | Freq: Once | ORAL | Status: AC
  Administered 2019-10-14: 4000 mL via ORAL

## 2019-10-13 NOTE — Progress Notes (Signed)
PROGRESS NOTE    Kristin [name]  RCV:[id number] DOB: February 02, 1976 DOA: 10/10/2019 PCP: System, Pcp Not In    Chief Complaint  Patient presents with  . Hypotension    Brief Narrative: 44 year old lady prior history of GERD, recent acute lateral meniscus tear of the right knee s/p repair on the 09/30/2019, anxiety, depression, bipolar disorder, hypertension, alcohol abuse presents with syncope associated with nausea vomiting diarrhea and fatigue mostly after recent knee procedure pain.  On arrival to ED she was found to have a hemoglobin of 5 underwent 2 units of PRBC transfusion and hemoglobin has improved to 9.  GI was consulted for evaluation of anemia of unclear etiology.  GI recommended outpatient follow-up with an EGD and colonoscopy.  Her stool for occult blood is negative at this time. Overnight her hemoglobin dropped from 9.4 to 8.2 . Requested GI re consult for EGD/ colonoscopy as we do not have a source of bleeding at this time.  Discussed with Dr Percell Miller her orthopedic surgeon who recommended no need for aspirin for DVT prophylaxis on discharge as she is ambulatory with her walker.  Pt seen and examined at bedside. Pt denies any new complaints.  Assessment & Plan:   Active Problems:   Osteoarthritis of right knee   AKI (acute kidney injury) ([place])   Bipolar disorder in remission ([place])   Dehydration   Esophagitis determined by endoscopy   Syncope   Anemia  Syncope Probably secondary to hypotension, and severe anemia. Further work-up for syncope revealed unremarkable echocardiogram.  Troponins have been minimally elevated but EKG does not show any significant ischemic changes. CT of the head without contrast does not show any acute intracranial abnormalities. Orthostatic vital signs this morning have been negative so far. EEG was ordered but patient refused.,  TSH was within normal limits. She received a total of 6 L of IV fluids and 2 units of PRBC transfusion and her  hypotension has resolved. CT of the abdomen and pelvis did not reveal any retroperitoneal bleeding.  It shows Wall thickening involving the cecum, ascending colon and transverse colon, possibly indicating colitis.  Borderline to mild hepatomegaly. Diffuse hepatic steatosis without focal hepatic parenchymal abnormality. Pt refused telemetry and it was removed.no dizziness or episodes of syncope while inpatient.     Acute anemia of blood loss probably secondary to GI source due to her history of gastritis and folic acid deficiency.  She underwent an EGD in 2019 which showed erosive esophagitis, chronic gastritis chronic duodenitis consistent with peptic duodenitis. Pt also reports using NSAIDS in addition to aspirin use int he last two weeks for DVT prophylaxis for the knee repair.  S/p 2 units of PRBC transfusion her hemoglobin trending at 5.2 to 6.5 to 9.2 to 8.6 to 9.4 to 8.2.  Dr Watt Climes  deferred the use of aspirin ( for DVT prophylaxis for the knee surgery ) to orthopedics. Dr Percell Miller recommended to discontinue aspirin/no need for aspirin on discharge for DVT prophylaxis. Due to drop in hemoglobin again, requested Dr Michail Sermon to see if she needs inpatient colonoscopy / EGD.   Hematology consulted, suggested that her iron studies indicates anemia of chronic disease probably relating to esophagitis, malabsorption, colitis..  Her peripheral smear was unremarkable. No indication for bone marrow biopsy at this time.  Recommended outpatient follow-up in the office in 2 to 3 weeks and if anemia still persistent with folic acid replacement and with adequate treatment of esophagitis, GERD, colitis then bone marrow biopsy may be indicated down  the road.   GERD continue with PPI daily.   Colitis of the cecum, ascending colon and transverse colon Patient was started on IV Rocephin and Flagyl. Patient denies any nausea vomiting and diarrhea at this time. C. difficile PCR is negative on admission. GI  pathogen PCR IS negative. No diarrhea so far.  Patient is currently afebrile and WBC count within normal limits.  Alcohol abuse Pt was agitated and confused yesterday, but today she is much better and calm and appears to agree with staying in the hospital for EGD and colonoscopy.  Patient is currently on CIWA protocol and IV Haldol ordered for intermittent agitation and confusion. CT abdomen shows diffuse hepatic steatosis.    Bipolar disorder Continue with Zyprexa.   AKI Probably secondary to dehydration and poor renal perfusion Resolved with IV fluids.   Right knee meniscal tear Repair on 09/30/2019.   Hyponatremia, Resolved.   Hypokalemia Replaced.    Mild metabolic acidosis Resolved with bicarbonate infusion.   DVT prophylaxis: scd's Code Status: full code.  Family Communication: none at bedside. discussed with daughter over the phone.  Disposition:   Status is: Inpatient  Remains inpatient appropriate because:Ongoing diagnostic testing needed not appropriate for outpatient work up, Unsafe d/c plan and Inpatient level of care appropriate due to severity of illness   Dispo: The patient is from: Home              Anticipated d/c is to: Home              Anticipated d/c date is: 1 day              Patient currently is not medically stable to d/c.       Consultants:   Gastroenterology  Hematology  orthopedics.   Procedures: none.   Antimicrobials:  Anti-infectives (From admission, onward)   Start     Dose/Rate Route Frequency Ordered Stop   10/11/19 1800  cefTRIAXone (ROCEPHIN) 2 g in sodium chloride 0.9 % 100 mL IVPB     Discontinue     2 g 200 mL/hr over 30 Minutes Intravenous Every 24 hours 10/11/19 1540     10/11/19 1800  metroNIDAZOLE (FLAGYL) IVPB 500 mg     Discontinue     500 mg 100 mL/hr over 60 Minutes Intravenous Every 8 hours 10/11/19 1540           Subjective: No nausea, vomiting or diarrhea. No chest pain or sob.   Objective: Vitals:   10/12/19 1620 10/12/19 2118 10/13/19 0534 10/13/19 1057  BP: (!) 141/96 126/81 (!) 117/87   Pulse: 79 78 89   Resp: _0 Temp: 98.2 F (36.8 C) 98.4 F (36.9 C) 98.6 F (37 C)   TempSrc: Oral Oral    SpO2: 100% 100% 100%   Weight:   85 kg   Height:    _1  (1.626 m)    Intake/Output Summary (Last 24 hours) at 10/13/2019 1348 Last data filed at 10/12/2019 1800 Gross per 24 hour  Intake 120 ml  Output --  Net 120 ml   Filed Weights   10/11/19 0500 10/13/19 0534  Weight: 85.7 kg 85 kg    Examination:  General exam: Alert and comfortable. Respiratory system: Clear to auscultation bilaterally, no wheezing or rhonchi Cardiovascular system:  S1-S2 heard, regular rate rhythm, no JVD, no pedal edema. Gastrointestinal system: Abdomen is soft, nontender, nondistended, bowel sounds normal Central nervous system: Alert and oriented to person and place  today, grossly nonfocal Extremities: No pedal edema Skin: No rashes seen Psychiatry: Mood & affect appropriate.     Data Reviewed: I have personally reviewed following labs and imaging studies  CBC: Recent Labs  Lab 10/10/19 0712 10/10/19 0712 10/10/19 1319 10/10/19 1319 10/10/19 1520 10/11/19 0536 10/12/19 0436 10/12/19 1644 10/13/19 1034  WBC 7.2  --  5.1  --   --  6.2 5.9  --  4.6  NEUTROABS 4.9  --  2.9  --   --  4.1 3.4  --   --   HGB 7.8*   < > 5.2*   < > 6.5* 9.2* 8.6* 9.4* 8.2*  HCT 23.5*   < > 15.8*   < > 19.8* 28.0* 25.1* 28.0* 24.1*  MCV 91.8  --  94.6  --   --  93.3 88.4  --  90.3  PLT 175  --  114*  --   --  132* 149*  --  160   < > = values in this interval not displayed.    Basic Metabolic Panel: Recent Labs  Lab 10/10/19 0712 10/10/19 1319 10/11/19 0536 10/12/19 0436  NA 125* 132* 134* 137  K 2.7* 3.3* 3.9 4.1  CL 90* 105 109 106  CO2 20* 18* 14* 23  GLUCOSE 106* 99 105* 92  BUN _0 <5*  CREATININE 2.06* 1.66* 0.88 0.51  CALCIUM 7.7* 6.3* 7.3* 8.3*  MG  --   1.1* 1.9 1.8  PHOS  --  3.2 2.9 3.3    GFR: Estimated Creatinine Clearance: 93.6 mL/min (by C-G formula based on SCr of 0.51 mg/dL).  Liver Function Tests: Recent Labs  Lab 10/10/19 2595 10/10/19 1319 10/11/19 0536 10/12/19 0436  AST 49* 30 41 33  ALT _1 ALKPHOS 133* 79 106 93  BILITOT 0.7 0.2* 0.5 0.5  PROT 6.2* 4.0* 5.3* 5.1*  ALBUMIN 3.4* 2.2* 2.9* 2.7*    CBG: Recent Labs  Lab 10/10/19 1303 10/11/19 0804 10/13/19 0835  GLUCAP 132* 117* 87     Recent Results (from the past 240 hour(s))  Culture, group A strep (throat)     Status: None   Collection Time: 10/05/19  5:52 PM   Specimen: Throat  Result Value Ref Range Status   Specimen Description THROAT  Final   Special Requests NONE  Final   Culture   Final    NO GROUP A STREP (S.PYOGENES) ISOLATED Performed at [facility], [place] [address] [place], [place] [address]    Report Status 10/08/2019 FINAL  Final  SARS Coronavirus 2 by RT PCR (hospital order, performed in [facility] hospital lab) Nasopharyngeal Nasopharyngeal Swab     Status: None   Collection Time: 10/10/19  8:33 AM   Specimen: Nasopharyngeal Swab  Result Value Ref Range Status   SARS Coronavirus 2 NEGATIVE NEGATIVE Final    Comment: (NOTE) SARS-CoV-2 target nucleic acids are NOT DETECTED.  The SARS-CoV-2 RNA is generally detectable in upper and lower respiratory specimens during the acute phase of infection. The lowest concentration of SARS-CoV-2 viral copies this assay can detect is 250 copies / mL. A negative result does not preclude SARS-CoV-2 infection and should not be used as the sole basis for treatment or other patient management decisions.  A negative result may occur with improper specimen collection / handling, submission of specimen other than nasopharyngeal swab, presence of viral mutation(s) within the areas targeted by this assay, and inadequate number of viral copies (<  250 copies / mL). A negative result must be  combined with clinical observations, patient history, and epidemiological information.  Fact Sheet for Patients:   StrictlyIdeas.no  Fact Sheet for Healthcare Providers: BankingDealers.co.za  This test is not yet approved or  cleared by the [place] FDA and has been authorized for detection and/or diagnosis of SARS-CoV-2 by FDA under an Emergency Use Authorization (EUA).  This EUA will remain in effect (meaning this test can be used) for the duration of the COVID-19 declaration under Section 564(b)(1) of the Act, 21 U.S.C. section 360bbb-3(b)(1), unless the authorization is terminated or revoked sooner.  Performed at [facility], [place] [address], [place], [place] [address]   Culture, blood (routine x 2)     Status: None (Preliminary result)   Collection Time: 10/10/19  1:57 PM   Specimen: BLOOD  Result Value Ref Range Status   Specimen Description   Final    BLOOD RIGHT ANTECUBITAL Performed at [place] [address] [place], [place] [address]    Special Requests   Final    BOTTLES DRAWN AEROBIC AND ANAEROBIC Blood Culture adequate volume Performed at [place] [address], [place], [place] [address]    Culture   Final    NO GROWTH 3 DAYS Performed at [facility], [place] [address] [place], [place] [address]    Report Status PENDING  Incomplete  Culture, blood (routine x 2)     Status: None (Preliminary result)   Collection Time: 10/10/19  1:57 PM   Specimen: BLOOD  Result Value Ref Range Status   Specimen Description   Final    BLOOD LEFT ANTECUBITAL Performed at [place] [address], [place], [place] [address]    Special Requests   Final    BOTTLES DRAWN AEROBIC AND ANAEROBIC Blood Culture adequate volume Performed at [place] [address], [place], [place] [address]    Culture   Final     NO GROWTH 3 DAYS Performed at [facility], [place] [address], [place], [place] [address]    Report Status PENDING  Incomplete  MRSA PCR Screening     Status: None   Collection Time: 10/10/19 10:45 PM   Specimen: Nasopharyngeal  Result Value Ref Range Status   MRSA by PCR NEGATIVE NEGATIVE Final    Comment:        The GeneXpert MRSA Assay (FDA approved for NASAL specimens only), is one component of a comprehensive MRSA colonization surveillance program. It is not intended to diagnose MRSA infection nor to guide or monitor treatment for MRSA infections. Performed at [facility], [place] [address], [place], [place] [address]   Gastrointestinal Panel by PCR , Stool     Status: None   Collection Time: 10/11/19  4:05 PM   Specimen: Rectum; Stool  Result Value Ref Range Status   Campylobacter species NOT DETECTED NOT DETECTED Final   Plesimonas shigelloides NOT DETECTED NOT DETECTED Final   Salmonella species NOT DETECTED NOT DETECTED Final   Yersinia enterocolitica NOT DETECTED NOT DETECTED Final   Vibrio species NOT DETECTED NOT DETECTED Final   Vibrio cholerae NOT DETECTED NOT DETECTED Final   Enteroaggregative E coli (EAEC) NOT DETECTED NOT DETECTED Final   Enteropathogenic E coli (EPEC) NOT DETECTED NOT DETECTED Final   Enterotoxigenic E coli (ETEC) NOT DETECTED NOT DETECTED Final   Shiga like toxin producing E coli (STEC) NOT DETECTED  NOT DETECTED Final   Shigella/Enteroinvasive E coli (EIEC) NOT DETECTED NOT DETECTED Final   Cryptosporidium NOT DETECTED NOT DETECTED Final   Cyclospora cayetanensis NOT DETECTED NOT DETECTED Final   Entamoeba histolytica NOT DETECTED NOT DETECTED Final   Giardia lamblia NOT DETECTED NOT DETECTED Final   Adenovirus F40/41 NOT DETECTED NOT DETECTED Final   Astrovirus NOT DETECTED NOT DETECTED Final   Norovirus GI/GII NOT DETECTED NOT DETECTED Final   Rotavirus A NOT DETECTED NOT DETECTED Final   Sapovirus (I, II, IV, and  V) NOT DETECTED NOT DETECTED Final    Comment: Performed at [facility], [place] [place], [place] [address]  C Difficile Quick Screen w PCR reflex     Status: None   Collection Time: 10/11/19  4:05 PM   Specimen: Rectum; Stool  Result Value Ref Range Status   C Diff antigen NEGATIVE NEGATIVE Final   C Diff toxin NEGATIVE NEGATIVE Final   C Diff interpretation No C. difficile detected.  Final    Comment: Performed at [facility], [place] [address], [place], [place] [address]         Radiology Studies: No results found.      Scheduled Meds: . sodium chloride   Intravenous Once  . folic acid  1 mg Oral Daily  . magnesium oxide  400 mg Oral Daily  . multivitamin with minerals  1 tablet Oral Daily  . OLANZapine  5 mg Oral QHS  . pantoprazole  40 mg Oral Daily  . thiamine  100 mg Oral Daily   Or  . thiamine  100 mg Intravenous Daily   Continuous Infusions: . cefTRIAXone (ROCEPHIN)  IV    . metronidazole 500 mg (10/13/19 0537)     LOS: 2 days       Hosie Poisson, MD [facility]   To contact the attending provider between 7A-7P or the covering provider during after hours 7P-7A, please log into the web site [email or web address] and access using universal [place] password for that web site. If you do not have the password, please call the hospital operator.  10/13/2019, 1:48 PM

## 2019-10-13 NOTE — Progress Notes (Signed)
Kristin [name]   DOB:1975/03/22   EU#:[id number]   XVQ#:[id number]  Hematology follow up  Subjective: Patient is overall feeling better, nausea resolved, appetite is still low, but if she does eat.  Diarrhea is also much better, only had 1 bowel movement today.  Her hemoglobin has been fluctuating, no bleeding. Pt told me she will have endoscopy tomorrow    Objective:  Vitals:   10/13/19 0534 10/13/19 1355  BP: (!) 117/87 (!) 138/96  Pulse: 89 74  Resp: 18 14  Temp: 98.6 F (37 C) 98.1 F (36.7 C)  SpO2: 100% 100%    Body mass index is 32.17 kg/m. No intake or output data in the 24 hours ending 10/13/19 1801   Sclerae unicteric  Not in distress   CBG (last 3)  Recent Labs    10/11/19 0804 10/13/19 0835  GLUCAP 117* 87     Labs:  Urine Studies No results for input(s): UHGB, CRYS in the last 72 hours.  Invalid input(s): UACOL, UAPR, USPG, UPH, UTP, UGL, [place], UBIL, UNIT, UROB, [place], UEPI, UWBC, Junie Panning [place], [place], [place]  Basic Metabolic Panel: Recent Labs  Lab 10/10/19 [id number] 10/10/19 0712 10/10/19 1319 10/10/19 1319 10/11/19 0536 10/12/19 0436  NA 125*  --  132*  --  134* 137  K 2.7*   < > 3.3*   < > 3.9 4.1  CL 90*  --  105  --  109 106  CO2 20*  --  18*  --  14* 23  GLUCOSE 106*  --  99  --  105* 92  BUN 13  --  11  --  9 <5*  CREATININE 2.06*  --  1.66*  --  0.88 0.51  CALCIUM 7.7*  --  6.3*  --  7.3* 8.3*  MG  --   --  1.1*  --  1.9 1.8  PHOS  --   --  3.2  --  2.9 3.3   < > = values in this interval not displayed.   GFR Estimated Creatinine Clearance: 93.6 mL/min (by C-G formula based on SCr of 0.51 mg/dL). Liver Function Tests: Recent Labs  Lab 10/10/19 0712 10/10/19 1319 10/11/19 0536 10/12/19 0436  AST 49* 30 41 33  ALT _0 ALKPHOS 133* 79 106 93  BILITOT 0.7 0.2* 0.5 0.5  PROT 6.2* 4.0* 5.3* 5.1*  ALBUMIN 3.4* 2.2* 2.9* 2.7*   No results for input(s): LIPASE, AMYLASE in the last 168 hours. No results for input(s): AMMONIA in  the last 168 hours. Coagulation profile Recent Labs  Lab 10/10/19 1557  INR 1.1    CBC: Recent Labs  Lab 10/10/19 0712 10/10/19 0712 10/10/19 1319 10/10/19 1319 10/10/19 1520 10/11/19 0536 10/12/19 0436 10/12/19 1644 10/13/19 1034  WBC 7.2  --  5.1  --   --  6.2 5.9  --  4.6  NEUTROABS 4.9  --  2.9  --   --  4.1 3.4  --   --   HGB 7.8*   < > 5.2*   < > 6.5* 9.2* 8.6* 9.4* 8.2*  HCT 23.5*   < > 15.8*   < > 19.8* 28.0* 25.1* 28.0* 24.1*  MCV 91.8  --  94.6  --   --  93.3 88.4  --  90.3  PLT 175  --  114*  --   --  132* 149*  --  160   < > = values in this interval not displayed.  Cardiac Enzymes: No results for input(s): CKTOTAL, CKMB, CKMBINDEX, TROPONINI in the last 168 hours. BNP: Invalid input(s): POCBNP CBG: Recent Labs  Lab 10/10/19 1303 10/11/19 0804 10/13/19 0835  GLUCAP 132* 117* 87   D-Dimer No results for input(s): DDIMER in the last 72 hours. Hgb A1c Recent Labs    10/11/19 0536  HGBA1C 5.8*   Lipid Profile No results for input(s): CHOL, HDL, LDLCALC, TRIG, CHOLHDL, LDLDIRECT in the last 72 hours. Thyroid function studies Recent Labs    10/11/19 0757  TSH 0.374   Anemia work up Recent Labs    10/11/19 0536  RETICCTPCT 0.8   Microbiology Recent Results (from the past 240 hour(s))  Culture, group A strep (throat)     Status: None   Collection Time: 10/05/19  5:52 PM   Specimen: Throat  Result Value Ref Range Status   Specimen Description THROAT  Final   Special Requests NONE  Final   Culture   Final    NO GROUP A STREP (S.PYOGENES) ISOLATED Performed at [facility], [address], [place], [place] [address]    Report Status 10/08/2019 FINAL  Final  SARS Coronavirus 2 by RT PCR (hospital order, performed in [facility] hospital lab) Nasopharyngeal Nasopharyngeal Swab     Status: None   Collection Time: 10/10/19  8:33 AM   Specimen: Nasopharyngeal Swab  Result Value Ref Range Status   SARS Coronavirus 2 NEGATIVE NEGATIVE  Final    Comment: (NOTE) SARS-CoV-2 target nucleic acids are NOT DETECTED.  The SARS-CoV-2 RNA is generally detectable in upper and lower respiratory specimens during the acute phase of infection. The lowest concentration of SARS-CoV-2 viral copies this assay can detect is 250 copies / mL. A negative result does not preclude SARS-CoV-2 infection and should not be used as the sole basis for treatment or other patient management decisions.  A negative result may occur with improper specimen collection / handling, submission of specimen other than nasopharyngeal swab, presence of viral mutation(s) within the areas targeted by this assay, and inadequate number of viral copies (<250 copies / mL). A negative result must be combined with clinical observations, patient history, and epidemiological information.  Fact Sheet for Patients:   StrictlyIdeas.no  Fact Sheet for Healthcare Providers: BankingDealers.co.za  This test is not yet approved or  cleared by the [place] FDA and has been authorized for detection and/or diagnosis of SARS-CoV-2 by FDA under an Emergency Use Authorization (EUA).  This EUA will remain in effect (meaning this test can be used) for the duration of the COVID-19 declaration under Section 564(b)(1) of the Act, 21 U.S.C. section 360bbb-3(b)(1), unless the authorization is terminated or revoked sooner.  Performed at [facility], [place] [address] [place], [place] [address]   Culture, blood (routine x 2)     Status: None (Preliminary result)   Collection Time: 10/10/19  1:57 PM   Specimen: BLOOD  Result Value Ref Range Status   Specimen Description   Final    BLOOD RIGHT ANTECUBITAL Performed at [facility] [address], [place], [place] [address]    Special Requests   Final    BOTTLES DRAWN AEROBIC AND ANAEROBIC Blood Culture adequate volume Performed at [facility] [address], [place], [place] [address]    Culture   Final    NO GROWTH 3 DAYS Performed at [facility], [place] [address], [place], [place] [address]    Report Status  PENDING  Incomplete  Culture, blood (routine x 2)     Status: None (Preliminary result)   Collection Time: 10/10/19  1:57 PM   Specimen: BLOOD  Result Value Ref Range Status   Specimen Description   Final    BLOOD LEFT ANTECUBITAL Performed at [facility] [address], [place], [place] [address]    Special Requests   Final    BOTTLES DRAWN AEROBIC AND ANAEROBIC Blood Culture adequate volume Performed at [facility] [address], [place], [place] [address]    Culture   Final    NO GROWTH 3 DAYS Performed at [facility], [place] [address], [place], [place] [address]    Report Status PENDING  Incomplete  MRSA PCR Screening     Status: None   Collection Time: 10/10/19 10:45 PM   Specimen: Nasopharyngeal  Result Value Ref Range Status   MRSA by PCR NEGATIVE NEGATIVE Final    Comment:        The GeneXpert MRSA Assay (FDA approved for NASAL specimens only), is one component of a comprehensive MRSA colonization surveillance program. It is not intended to diagnose MRSA infection nor to guide or monitor treatment for MRSA infections. Performed at [facility], [place] [address] [place], [place] [address]   Gastrointestinal Panel by PCR , Stool     Status: None   Collection Time: 10/11/19  4:05 PM   Specimen: Rectum; Stool  Result Value Ref Range Status   Campylobacter species NOT DETECTED NOT DETECTED Final   Plesimonas shigelloides NOT DETECTED NOT DETECTED Final   Salmonella species NOT DETECTED NOT DETECTED Final   Yersinia enterocolitica NOT DETECTED NOT DETECTED Final   Vibrio species NOT DETECTED NOT DETECTED Final   Vibrio cholerae NOT DETECTED NOT DETECTED Final   Enteroaggregative E coli (EAEC) NOT DETECTED  NOT DETECTED Final   Enteropathogenic E coli (EPEC) NOT DETECTED NOT DETECTED Final   Enterotoxigenic E coli (ETEC) NOT DETECTED NOT DETECTED Final   Shiga like toxin producing E coli (STEC) NOT DETECTED NOT DETECTED Final   Shigella/Enteroinvasive E coli (EIEC) NOT DETECTED NOT DETECTED Final   Cryptosporidium NOT DETECTED NOT DETECTED Final   Cyclospora cayetanensis NOT DETECTED NOT DETECTED Final   Entamoeba histolytica NOT DETECTED NOT DETECTED Final   Giardia lamblia NOT DETECTED NOT DETECTED Final   Adenovirus F40/41 NOT DETECTED NOT DETECTED Final   Astrovirus NOT DETECTED NOT DETECTED Final   Norovirus GI/GII NOT DETECTED NOT DETECTED Final   Rotavirus A NOT DETECTED NOT DETECTED Final   Sapovirus (I, II, IV, and V) NOT DETECTED NOT DETECTED Final    Comment: Performed at [facility], [place] [place], [place] [address]  C Difficile Quick Screen w PCR reflex     Status: None   Collection Time: 10/11/19  4:05 PM   Specimen: Rectum; Stool  Result Value Ref Range Status   C Diff antigen NEGATIVE NEGATIVE Final   C Diff toxin NEGATIVE NEGATIVE Final   C Diff interpretation No C. difficile detected.  Final    Comment: Performed at [facility], [place] [address] [place], [place] [address]      Studies:  No results found.  Assessment: 44 y.o.   1.  Syncope secondary to hypotension and dehydration, resolved  2.  Severe anemia with hemoglobin 5.2, s/p 2 units blood transfusion, normocytic and hypoproductive  3. Folate deficiency due to alcohol drinking  4. Nausea, vomiting,  GERD 5. Diarrhea, colitis 6. Recent right knee surgery  7.  Anxiety, depression and bipolar    Plan:  -Her Hg has been fluctuating in past few days since blood transfusion, I do not think it's true drop today, but GI work up is reasonable if this planned before discharge -I will hold bone marrow biopsy at this point -Continue folate acid 1 mg daily -I will set up  office visit in 2-3 weeks with lab.    Truitt Merle, MD 10/13/2019  6:01 PM

## 2019-10-14 ENCOUNTER — Telehealth: Payer: Self-pay | Admitting: Hematology

## 2019-10-14 LAB — CBC WITH DIFFERENTIAL/PLATELET
Abs Immature Granulocytes: 0.02 10*3/uL (ref 0.00–0.07)
Basophils Absolute: 0.1 10*3/uL (ref 0.0–0.1)
Basophils Relative: 1 %
Eosinophils Absolute: 0.2 10*3/uL (ref 0.0–0.5)
Eosinophils Relative: 4 %
HCT: 26.2 % — ABNORMAL LOW (ref 36.0–46.0)
Hemoglobin: 8.8 g/dL — ABNORMAL LOW (ref 12.0–15.0)
Immature Granulocytes: 0 %
Lymphocytes Relative: 18 %
Lymphs Abs: 1 10*3/uL (ref 0.7–4.0)
MCH: 30.9 pg (ref 26.0–34.0)
MCHC: 33.6 g/dL (ref 30.0–36.0)
MCV: 91.9 fL (ref 80.0–100.0)
Monocytes Absolute: 1 10*3/uL (ref 0.1–1.0)
Monocytes Relative: 19 %
Neutro Abs: 3.1 10*3/uL (ref 1.7–7.7)
Neutrophils Relative %: 58 %
Platelets: 198 10*3/uL (ref 150–400)
RBC: 2.85 MIL/uL — ABNORMAL LOW (ref 3.87–5.11)
RDW: 15.1 % (ref 11.5–15.5)
WBC: 5.4 10*3/uL (ref 4.0–10.5)
nRBC: 0 % (ref 0.0–0.2)

## 2019-10-14 LAB — BASIC METABOLIC PANEL
Anion gap: 10 (ref 5–15)
BUN: <5 mg/dL — ABNORMAL LOW (ref 6–20)
CO2: 25 mmol/L (ref 22–32)
Calcium: 8.3 mg/dL — ABNORMAL LOW (ref 8.9–10.3)
Chloride: 104 mmol/L (ref 98–111)
Creatinine, Ser: 0.55 mg/dL (ref 0.44–1.00)
GFR calc Af Amer: >60 mL/min (ref >60)
GFR calc non Af Amer: >60 mL/min (ref >60)
Glucose, Bld: 92 mg/dL (ref 70–99)
Potassium: 3.8 mmol/L (ref 3.5–5.1)
Sodium: 139 mmol/L (ref 135–145)

## 2019-10-14 LAB — GLUCOSE, CAPILLARY
Glucose-Capillary: 87 mg/dL (ref 70–99)
Glucose-Capillary: 87 mg/dL (ref 70–99)

## 2019-10-14 MED ORDER — TRAMADOL HCL 50 MG PO TABS
50.0000 mg | ORAL_TABLET | Freq: Four times a day (QID) | ORAL | Status: DC | PRN
  Administered 2019-10-14 (×2): 50 mg via ORAL
  Filled 2019-10-14 (×2): qty 1

## 2019-10-14 NOTE — Progress Notes (Signed)
PROGRESS NOTE    Kristin [name]  ZOX:[id number] DOB: 05/01/1975 DOA: 10/10/2019 PCP: System, Pcp Not In    Chief Complaint  Patient presents with   Hypotension    Brief Narrative: 44 year old lady prior history of GERD, recent acute lateral meniscus tear of the right knee s/p repair on the 09/30/2019, anxiety, depression, bipolar disorder, hypertension, alcohol abuse presents with syncope associated with nausea vomiting diarrhea and fatigue mostly after recent knee procedure pain.  On arrival to ED she was found to have a hemoglobin of 5 underwent 2 units of PRBC transfusion and hemoglobin has improved to 9.  GI was consulted for evaluation of anemia of unclear etiology.  GI initially outpatient follow-up with an EGD and colonoscopy.  Her stool for occult blood is negative at this time. Overnight her hemoglobin dropped from 9.4 to 8.2 . Requested GI re consult for EGD/ colonoscopy as we do not have a source of bleeding at this time. She is scheduled for for EGD and colonoscopy tomorrow. NPO after midnight.  Discussed with Dr Percell Miller her orthopedic surgeon who recommended no need for aspirin for DVT prophylaxis on discharge as she is ambulatory with her walker.   Assessment & Plan:   Active Problems:   Osteoarthritis of right knee   AKI (acute kidney injury) ([place])   Bipolar disorder in remission ([place])   Dehydration   Esophagitis determined by endoscopy   Syncope   Anemia  Syncope Probably secondary to hypotension, and severe anemia. Further work-up for syncope revealed unremarkable echocardiogram.  Troponins have been minimally elevated, EKG shows borderline t wave abnormalities.  CT of the head without contrast does not show any acute intracranial abnormalities. Orthostatic vital signs on 10/13/19 have been negative so far. EEG was ordered but patient refused.,  TSH was within normal limits. She received a total of 6 L of IV fluids and 2 units of PRBC transfusion and her hypotension  has resolved. CT of the abdomen and pelvis did not reveal any retroperitoneal bleeding.  It shows Wall thickening involving the cecum, ascending colon and transverse colon, possibly indicating colitis.  Borderline to mild hepatomegaly. Diffuse hepatic steatosis without focal hepatic parenchymal abnormality. Pt denies any dizziness or syncope.     Acute anemia of blood loss probably secondary to GI source due to her history of gastritis and folic acid deficiency.  She underwent an EGD in 2019 which showed erosive esophagitis, chronic gastritis chronic duodenitis consistent with peptic duodenitis. Pt also reports using NSAIDS in addition to aspirin use in the last two weeks for DVT prophylaxis for the knee repair.  S/p 2 units of PRBC transfusion her hemoglobin trending at 5.2 to 6.5 to 9.2 to 8.6 to 9.4 to 8.2 to 8.8.   Dr Percell Miller recommended to discontinue aspirin/no need for aspirin on discharge for DVT prophylaxis. Due to drop in hemoglobin again, requested Dr Michail Sermon to see if she needs inpatient colonoscopy / EGD.  She is scheduled for EGD and colonoscopy in am.   Hematology consulted, suggested that her iron studies indicates anemia of chronic disease probably relating to esophagitis, malabsorption, colitis..  Her peripheral smear was unremarkable. No indication for bone marrow biopsy at this time.  Recommended outpatient follow-up in the office in 2 to 3 weeks and if anemia still persistent with folic acid replacement and with adequate treatment of esophagitis, GERD, colitis then bone marrow biopsy may be indicated down the road.   GERD continue with PPI daily.   Colitis of the cecum,  ascending colon and transverse colon Patient was started on IV Rocephin and Flagyl on 7/31 , plan for 7 days of antibiotics.  Patient denies any nausea, vomiting or diarrhea at this time C. difficile PCR is negative on admission. GI pathogen PCR IS negative. No diarrhea so far.  Patient is currently  afebrile and WBC count within normal limits.  Alcohol abuse Pt was agitated and confused on 10/12/19, but  she is much better, calm and agreed for EGD and colonoscopy.  Patient is currently on CIWA protocol and IV Haldol ordered for intermittent agitation and confusion. CT abdomen shows diffuse hepatic steatosis.    Bipolar disorder Continue with Zyprexa.   AKI Probably secondary to dehydration and poor renal perfusion Resolved with IV fluids.   Right knee meniscal tear Repair on 09/30/2019. Recommend outpatient follow up with orthopedics for suture removal.    Hyponatremia, Resolved.   Hypokalemia Replaced.    Mild metabolic acidosis Resolved with bicarbonate infusion.   DVT prophylaxis: scd's Code Status: full code.  Family Communication: none at bedside. discussed with daughter over the phone.  Disposition:   Status is: Inpatient  Remains inpatient appropriate because:Ongoing diagnostic testing needed not appropriate for outpatient work up, Unsafe d/c plan and Inpatient level of care appropriate due to severity of illness   Dispo: The patient is from: Home              Anticipated d/c is to: Home              Anticipated d/c date is: 1 day              Patient currently is not medically stable to d/c.       Consultants:   Gastroenterology  Hematology  orthopedics.   Procedures: none.   Antimicrobials:  Anti-infectives (From admission, onward)   Start     Dose/Rate Route Frequency Ordered Stop   10/11/19 1800  cefTRIAXone (ROCEPHIN) 2 g in sodium chloride 0.9 % 100 mL IVPB     Discontinue     2 g 200 mL/hr over 30 Minutes Intravenous Every 24 hours 10/11/19 1540     10/11/19 1800  metroNIDAZOLE (FLAGYL) IVPB 500 mg     Discontinue     500 mg 100 mL/hr over 60 Minutes Intravenous Every 8 hours 10/11/19 1540           Subjective: No nausea, vomiting or diarrhea. No chest pain or sob.   Objective: Vitals:   10/13/19 1355 10/13/19 2201  10/14/19 0535 10/14/19 1256  BP: (!) 138/96 (!) 127/96 (!) 139/95 111/80  Pulse: 74 75 67 89  Resp: '14 14 14 16  '$ Temp: 98.1 F (36.7 C) 98.9 F (37.2 C) 98.6 F (37 C) 98.2 F (36.8 C)  TempSrc: Oral Oral Oral Oral  SpO2: 100% 100% 100% 99%  Weight:      Height:        Intake/Output Summary (Last 24 hours) at 10/14/2019 1601 Last data filed at 10/14/2019 0200 Gross per 24 hour  Intake 678.95 ml  Output --  Net 678.95 ml   Filed Weights   10/11/19 0500 10/13/19 0534  Weight: 85.7 kg 85 kg    Examination:  General exam: Alert, not in any kind of distress Respiratory system: Clear to auscultation bilaterally, no wheezing or rhonchi Cardiovascular system: S1-S2 heard, regular rate rhythm, no JVD, no pedal edema Gastrointestinal system: Abdomen is soft, nontender, bowel sounds normal Central nervous system: Alert and oriented to place and  person today, no deficits seen. Extremities: No pedal edema,  clear bandage on the Right knee, sutures in place, slightly tender at the suture site Skin: No rashes seen Psychiatry: Mood is appropriate  Data Reviewed: I have personally reviewed following labs and imaging studies  CBC: Recent Labs  Lab 10/10/19 0712 10/10/19 0712 10/10/19 1319 10/10/19 1520 10/11/19 0536 10/12/19 0436 10/12/19 1644 10/13/19 1034 10/14/19 0844  WBC 7.2   < > 5.1  --  6.2 5.9  --  4.6 5.4  NEUTROABS 4.9  --  2.9  --  4.1 3.4  --   --  3.1  HGB 7.8*   < > 5.2*   < > 9.2* 8.6* 9.4* 8.2* 8.8*  HCT 23.5*   < > 15.8*   < > 28.0* 25.1* 28.0* 24.1* 26.2*  MCV 91.8   < > 94.6  --  93.3 88.4  --  90.3 91.9  PLT 175   < > 114*  --  132* 149*  --  160 198   < > = values in this interval not displayed.    Basic Metabolic Panel: Recent Labs  Lab 10/10/19 0712 10/10/19 1319 10/11/19 0536 10/12/19 0436 10/14/19 0844  NA 125* 132* 134* 137 139  K 2.7* 3.3* 3.9 4.1 3.8  CL 90* 105 109 106 104  CO2 20* 18* 14* 23 25  GLUCOSE 106* 99 105* 92 92  BUN '13 11  9 '$ <5* <5*  CREATININE 2.06* 1.66* 0.88 0.51 0.55  CALCIUM 7.7* 6.3* 7.3* 8.3* 8.3*  MG  --  1.1* 1.9 1.8  --   PHOS  --  3.2 2.9 3.3  --     GFR: Estimated Creatinine Clearance: 93.6 mL/min (by C-G formula based on SCr of 0.55 mg/dL).  Liver Function Tests: Recent Labs  Lab 10/10/19 0712 10/10/19 1319 10/11/19 0536 10/12/19 0436  AST 49* 30 41 33  ALT '13 9 12 10  '$ ALKPHOS 133* 79 106 93  BILITOT 0.7 0.2* 0.5 0.5  PROT 6.2* 4.0* 5.3* 5.1*  ALBUMIN 3.4* 2.2* 2.9* 2.7*    CBG: Recent Labs  Lab 10/10/19 1303 10/11/19 0804 10/13/19 0835 10/14/19 0751  GLUCAP 132* 117* 87 87   87     Recent Results (from the past 240 hour(s))  Culture, group A strep (throat)     Status: None   Collection Time: 10/05/19  5:52 PM   Specimen: Throat  Result Value Ref Range Status   Specimen Description THROAT  Final   Special Requests NONE  Final   Culture   Final    NO GROUP A STREP (S.PYOGENES) ISOLATED Performed at [facility], [place] [address], [place], [place] [address]    Report Status 10/08/2019 FINAL  Final  SARS Coronavirus 2 by RT PCR (hospital order, performed in [facility] hospital lab) Nasopharyngeal Nasopharyngeal Swab     Status: None   Collection Time: 10/10/19  8:33 AM   Specimen: Nasopharyngeal Swab  Result Value Ref Range Status   SARS Coronavirus 2 NEGATIVE NEGATIVE Final    Comment: (NOTE) SARS-CoV-2 target nucleic acids are NOT DETECTED.  The SARS-CoV-2 RNA is generally detectable in upper and lower respiratory specimens during the acute phase of infection. The lowest concentration of SARS-CoV-2 viral copies this assay can detect is 250 copies / mL. A negative result does not preclude SARS-CoV-2 infection and should not be used as the sole basis for treatment or other patient management decisions.  A negative result may  occur with improper specimen collection / handling, submission of specimen other than nasopharyngeal swab, presence of viral  mutation(s) within the areas targeted by this assay, and inadequate number of viral copies (<250 copies / mL). A negative result must be combined with clinical observations, patient history, and epidemiological information.  Fact Sheet for Patients:   StrictlyIdeas.no  Fact Sheet for Healthcare Providers: BankingDealers.co.za  This test is not yet approved or  cleared by the [place] FDA and has been authorized for detection and/or diagnosis of SARS-CoV-2 by FDA under an Emergency Use Authorization (EUA).  This EUA will remain in effect (meaning this test can be used) for the duration of the COVID-19 declaration under Section 564(b)(1) of the Act, 21 U.S.C. section 360bbb-3(b)(1), unless the authorization is terminated or revoked sooner.  Performed at [facility], [place] [address] [place], [place] [address]   Culture, blood (routine x 2)     Status: None (Preliminary result)   Collection Time: 10/10/19  1:57 PM   Specimen: BLOOD  Result Value Ref Range Status   Specimen Description   Final    BLOOD RIGHT ANTECUBITAL Performed at [place] [address] [place], [place] [address]    Special Requests   Final    BOTTLES DRAWN AEROBIC AND ANAEROBIC Blood Culture adequate volume Performed at [place] [address] [place], [place] [address]    Culture   Final    NO GROWTH 4 DAYS Performed at [facility], [place] [address], [place], [place] [address]    Report Status PENDING  Incomplete  Culture, blood (routine x 2)     Status: None (Preliminary result)   Collection Time: 10/10/19  1:57 PM   Specimen: BLOOD  Result Value Ref Range Status   Specimen Description   Final    BLOOD LEFT ANTECUBITAL Performed at [place] [address], [place], [place] [address]    Special Requests   Final    BOTTLES DRAWN AEROBIC AND ANAEROBIC Blood  Culture adequate volume Performed at [place] [address] [place], [place] [address]    Culture   Final    NO GROWTH 4 DAYS Performed at [facility], [place] [address], [place],  [address]    Report Status PENDING  Incomplete  MRSA PCR Screening     Status: None   Collection Time: 10/10/19 10:45 PM   Specimen: Nasopharyngeal  Result Value Ref Range Status   MRSA by PCR NEGATIVE NEGATIVE Final    Comment:        The GeneXpert MRSA Assay (FDA approved for NASAL specimens only), is one component of a comprehensive MRSA colonization surveillance program. It is not intended to diagnose MRSA infection nor to guide or monitor treatment for MRSA infections. Performed at [facility], [place] [address] [place],  [address]   Gastrointestinal Panel by PCR , Stool     Status: None   Collection Time: 10/11/19  4:05 PM   Specimen: Rectum; Stool  Result Value Ref Range Status   Campylobacter species NOT DETECTED NOT DETECTED Final   Plesimonas shigelloides NOT DETECTED NOT DETECTED Final   Salmonella species NOT DETECTED NOT DETECTED Final   Yersinia enterocolitica NOT DETECTED NOT DETECTED Final   Vibrio species NOT DETECTED NOT DETECTED Final   Vibrio cholerae NOT DETECTED NOT DETECTED Final   Enteroaggregative E coli (EAEC) NOT DETECTED NOT DETECTED Final  Enteropathogenic E coli (EPEC) NOT DETECTED NOT DETECTED Final   Enterotoxigenic E coli (ETEC) NOT DETECTED NOT DETECTED Final   Shiga like toxin producing E coli (STEC) NOT DETECTED NOT DETECTED Final   Shigella/Enteroinvasive E coli (EIEC) NOT DETECTED NOT DETECTED Final   Cryptosporidium NOT DETECTED NOT DETECTED Final   Cyclospora cayetanensis NOT DETECTED NOT DETECTED Final   Entamoeba histolytica NOT DETECTED NOT DETECTED Final   Giardia lamblia NOT DETECTED NOT DETECTED Final   Adenovirus F40/41 NOT DETECTED NOT DETECTED Final   Astrovirus NOT DETECTED NOT  DETECTED Final   Norovirus GI/GII NOT DETECTED NOT DETECTED Final   Rotavirus A NOT DETECTED NOT DETECTED Final   Sapovirus (I, II, IV, and V) NOT DETECTED NOT DETECTED Final    Comment: Performed at [facility], [place] [place], [place] [address]  C Difficile Quick Screen w PCR reflex     Status: None   Collection Time: 10/11/19  4:05 PM   Specimen: Rectum; Stool  Result Value Ref Range Status   C Diff antigen NEGATIVE NEGATIVE Final   C Diff toxin NEGATIVE NEGATIVE Final   C Diff interpretation No C. difficile detected.  Final    Comment: Performed at [facility], [place] [address], [place], [place] [address]         Radiology Studies: No results found.      Scheduled Meds:  sodium chloride   Intravenous Once   folic acid  1 mg Oral Daily   magnesium oxide  400 mg Oral Daily   multivitamin with minerals  1 tablet Oral Daily   OLANZapine  5 mg Oral QHS   pantoprazole  40 mg Oral Daily   polyethylene glycol-electrolytes  4,000 mL Oral Once   thiamine  100 mg Oral Daily   Or   thiamine  100 mg Intravenous Daily   Continuous Infusions:  sodium chloride 15 mL/hr at 10/14/19 0601   cefTRIAXone (ROCEPHIN)  IV Stopped (10/13/19 2015)   metronidazole 500 mg (10/14/19 1505)     LOS: 3 days       Hosie Poisson, MD [facility]   To contact the attending provider between 7A-7P or the covering provider during after hours 7P-7A, please log into the web site [email or web address] and access using universal [place] password for that web site. If you do not have the password, please call the hospital operator.  10/14/2019, 4:01 PM

## 2019-10-14 NOTE — Telephone Encounter (Signed)
A hospital follow up appt has been scheduled for Kristin [name] to see Dr. Mosetta Putt on 8/26 at 940am w/labs at 9am. Pt is currently in the hospital. Letter mailed.

## 2019-10-15 ENCOUNTER — Inpatient Hospital Stay (HOSPITAL_COMMUNITY): Payer: 59 | Admitting: Anesthesiology

## 2019-10-15 ENCOUNTER — Encounter (HOSPITAL_COMMUNITY)
Admission: EM | Disposition: A | Discharge status: Home / Self Care | Payer: Self-pay | Source: Home / Self Care | Attending: Internal Medicine

## 2019-10-15 ENCOUNTER — Encounter (HOSPITAL_COMMUNITY): Payer: Self-pay | Admitting: Internal Medicine

## 2019-10-15 HISTORY — PX: COLONOSCOPY: SHX5424

## 2019-10-15 HISTORY — PX: BIOPSY: SHX5522

## 2019-10-15 HISTORY — PX: ESOPHAGOGASTRODUODENOSCOPY: SHX5428

## 2019-10-15 LAB — CULTURE, BLOOD (ROUTINE X 2)
Culture: NO GROWTH
Culture: NO GROWTH
Special Requests: ADEQUATE
Special Requests: ADEQUATE

## 2019-10-15 LAB — GLUCOSE, CAPILLARY: Glucose-Capillary: 78 mg/dL (ref 70–99)

## 2019-10-15 SURGERY — EGD (ESOPHAGOGASTRODUODENOSCOPY)
Anesthesia: Monitor Anesthesia Care

## 2019-10-15 MED ORDER — PROPOFOL 500 MG/50ML IV EMUL
INTRAVENOUS | Status: DC | PRN
  Administered 2019-10-15: 50 ug/kg/min via INTRAVENOUS

## 2019-10-15 MED ORDER — LACTINEX PO CHEW: 1.0000 | CHEWABLE_TABLET | Freq: Three times a day (TID) | ORAL | 0 refills | Status: AC

## 2019-10-15 MED ORDER — METRONIDAZOLE 500 MG PO TABS
500.0000 mg | ORAL_TABLET | Freq: Three times a day (TID) | ORAL | 0 refills | Status: AC
Start: 2019-10-15 — End: 2019-10-25

## 2019-10-15 MED ORDER — ONDANSETRON HCL 4 MG/2ML IJ SOLN
INTRAMUSCULAR | Status: DC | PRN
Start: 2019-10-15 — End: 2019-10-15
  Administered 2019-10-15: 4 mg via INTRAVENOUS

## 2019-10-15 MED ORDER — PROPOFOL 10 MG/ML IV BOLUS
INTRAVENOUS | Status: DC | PRN
  Administered 2019-10-15 (×2): 20 mg via INTRAVENOUS
  Administered 2019-10-15 (×2): 10 mg via INTRAVENOUS
  Administered 2019-10-15 (×6): 20 mg via INTRAVENOUS
  Administered 2019-10-15: 10 mg via INTRAVENOUS

## 2019-10-15 MED ORDER — LIDOCAINE HCL 1 % IJ SOLN
INTRAMUSCULAR | Status: DC | PRN
  Administered 2019-10-15: 40 mg via INTRADERMAL

## 2019-10-15 MED ORDER — LACTATED RINGERS IV SOLN: INTRAVENOUS | Status: DC

## 2019-10-15 MED ORDER — ESMOLOL HCL 100 MG/10ML IV SOLN
INTRAVENOUS | Status: DC | PRN
  Administered 2019-10-15: 20 mg via INTRAVENOUS

## 2019-10-15 MED ORDER — CIPROFLOXACIN HCL 500 MG PO TABS
500.0000 mg | ORAL_TABLET | Freq: Two times a day (BID) | ORAL | 0 refills | Status: AC
Start: 2019-10-15 — End: 2019-10-25

## 2019-10-15 NOTE — Interval H&P Note (Signed)
History and Physical Interval Note:  10/15/2019 10:13 AM  Kristin [name]  has presented today for surgery, with the diagnosis of anemia.  The various methods of treatment have been discussed with the patient and family. After consideration of risks, benefits and other options for treatment, the patient has consented to  Procedure(s): ESOPHAGOGASTRODUODENOSCOPY (EGD) (N/A) COLONOSCOPY (N/A) as a surgical intervention.  The patient's history has been reviewed, patient examined, no change in status, stable for surgery.  I have reviewed the patient's chart and labs.  Questions were answered to the patient's satisfaction.     Shirley Friar

## 2019-10-15 NOTE — Progress Notes (Signed)
Patient unable to tolarate full amount of bowel prep. Total intake of approximately 2/3 of bowel prep media. Patient Npo at midnight.

## 2019-10-15 NOTE — Anesthesia Postprocedure Evaluation (Signed)
Anesthesia Post Note  [name]  Procedure(s) Performed: ESOPHAGOGASTRODUODENOSCOPY (EGD) (N/A ) COLONOSCOPY (N/A ) BIOPSY     Patient location during evaluation: PACU Anesthesia Type: MAC Level of consciousness: awake and alert Pain management: pain level controlled Vital Signs Assessment: post-procedure vital signs reviewed and stable Respiratory status: spontaneous breathing, nonlabored ventilation, respiratory function stable and patient connected to nasal cannula oxygen Cardiovascular status: stable and blood pressure returned to baseline Postop Assessment: no apparent nausea or vomiting Anesthetic complications: no   No complications documented.  Last Vitals:  Vitals:   10/15/19 1120 10/15/19 1125  BP: (!) 143/107 (!) 150/98  Pulse: 68 68  Resp: 18 15  Temp:    SpO2: 100% 100%    Last Pain:  Vitals:   10/15/19 1125  TempSrc:   PainSc: 0-No pain                 Effie Berkshire

## 2019-10-15 NOTE — Anesthesia Preprocedure Evaluation (Addendum)
Anesthesia Evaluation  Patient identified by MRN, date of birth, ID band Patient awake    Reviewed: Allergy & Precautions, NPO status , Patient's Chart, lab work & pertinent test results  Airway Mallampati: III  TM Distance: >3 FB Neck ROM: Full    Dental  (+) Teeth Intact, Dental Advisory Given   Pulmonary Current Smoker,    breath sounds clear to auscultation       Cardiovascular hypertension, Pt. on medications  Rhythm:Regular Rate:Normal     Neuro/Psych  Headaches, PSYCHIATRIC DISORDERS Anxiety Depression Bipolar Disorder    GI/Hepatic Neg liver ROS, GERD  Medicated,  Endo/Other  negative endocrine ROS  Renal/GU Renal disease     Musculoskeletal  (+) Arthritis ,   Abdominal Normal abdominal exam  (+)   Peds  Hematology   Anesthesia Other Findings   Reproductive/Obstetrics                            Anesthesia Physical Anesthesia Plan  ASA: II  Anesthesia Plan: MAC   Post-op Pain Management:    Induction: Intravenous  PONV Risk Score and Plan: 0 and Propofol infusion  Airway Management Planned: Natural Airway and Simple Face Mask  Additional Equipment: None  Intra-op Plan:   Post-operative Plan:   Informed Consent: I have reviewed the patients History and Physical, chart, labs and discussed the procedure including the risks, benefits and alternatives for the proposed anesthesia with the patient or authorized representative who has indicated his/her understanding and acceptance.     Dental advisory given  Plan Discussed with: CRNA  Anesthesia Plan Comments:        Anesthesia Quick Evaluation

## 2019-10-15 NOTE — Op Note (Signed)
[facility] Patient Name: Kristin [name] Procedure Date: 10/15/2019 MRN: [id number] Attending MD: Lear Ng , MD Date of Birth: 02-18-1976 CSN: [id number] Age: 44 Admit Type: Inpatient Procedure:                Colonoscopy Indications:              Iron deficiency anemia Providers:                Lear Ng, MD, Cleda Daub, RN, Tyna Jaksch Technician, Karis Juba, CRNA Referring MD:             hospital team Medicines:                Propofol per Anesthesia, Monitored Anesthesia Care Complications:            No immediate complications. Estimated Blood Loss:     Estimated blood loss: none. Procedure:                Pre-Anesthesia Assessment:                           - Prior to the procedure, a History and Physical                            was performed, and patient medications and                            allergies were reviewed. The patient's tolerance of                            previous anesthesia was also reviewed. The risks                            and benefits of the procedure and the sedation                            options and risks were discussed with the patient.                            All questions were answered, and informed consent                            was obtained. Prior Anticoagulants: The patient has                            taken no previous anticoagulant or antiplatelet                            agents. ASA Grade Assessment: II - A patient with                            mild systemic disease. After reviewing the risks  and benefits, the patient was deemed in                            satisfactory condition to undergo the procedure.                           After obtaining informed consent, the colonoscope                            was passed under direct vision. Throughout the                            procedure, the patient's blood pressure,  pulse, and                            oxygen saturations were monitored continuously. The                            [id number]-[id number] ([id number] Olympus pediatric colonscope                            was introduced through the anus and advanced to the                            the cecum, identified by appendiceal orifice and                            ileocecal valve. The colonoscopy was performed                            without difficulty. The patient tolerated the                            procedure well. The quality of the bowel                            preparation was adequate and good. The ileocecal                            valve, appendiceal orifice, and rectum were                            photographed. Scope In: 10:48:27 AM Scope Out: 10:56:47 AM Scope Withdrawal Time: 0 hours 4 minutes 37 seconds  Total Procedure Duration: 0 hours 8 minutes 20 seconds  Findings:      The perianal and digital rectal examinations were normal.      Internal hemorrhoids were found during retroflexion. The hemorrhoids       were small and Grade I (internal hemorrhoids that do not prolapse). Impression:               - Internal hemorrhoids.                           - No specimens collected. Moderate Sedation:      N/A -  MAC procedure Recommendation:           - Advance diet as tolerated and soft diet.                           - Outpatient capsule endoscopy. Procedure Code(s):        --- Professional ---                           [id number], Colonoscopy, flexible; diagnostic, including                            collection of specimen(s) by brushing or washing,                            when performed (separate procedure) Diagnosis Code(s):        --- Professional ---                           D50.9, Iron deficiency anemia, unspecified                           K64.0, First degree hemorrhoids CPT copyright 2019 American Medical Association. All rights reserved. The codes documented in this  report are preliminary and upon coder review may  be revised to meet current compliance requirements. Lear Ng, MD 10/15/2019 11:11:03 AM This report has been signed electronically. Number of Addenda: 0

## 2019-10-15 NOTE — Discharge Summary (Signed)
Physician Discharge Summary [facility]    [name]                   Admit date: 10/10/2019   DOB: March 19, 1975             Discharge date:10/15/2019/2:21 PM ZHY:[id number]                          PCP: System, Pcp Not In  Disposition: HOME   Recommendations for Outpatient Follow-up:   . Follow up: in 1 week  Discharge Condition: Stable   Code Status:   Code Status: Full Code  Diet recommendation: Regular healthy diet   Discharge Diagnoses:    Active Problems:   Osteoarthritis of right knee   AKI (acute kidney injury) ([place])   Bipolar disorder in remission ([place])   Dehydration   Esophagitis determined by endoscopy   Syncope   Anemia   History of Present Illness/ Hospital Course [name] Summary:  44 year old lady prior history of GERD, recent acute lateral meniscus tear of the right knee s/p repair on the 09/30/2019, anxiety, depression, bipolar disorder, hypertension, alcohol abuse presents with syncope associated with nausea vomiting diarrhea and fatigue mostly after recent knee procedure pain. On arrival to ED she was found to have a hemoglobin of 5 underwent 2 units of PRBC transfusion and hemoglobin has improved to 9. GI was consulted for evaluation of anemia of unclear etiology. GI initiallyoutpatient follow-up with an EGD and colonoscopy. Her stool for occult blood is negative at this time. Overnight her hemoglobin dropped from 9.4 to 8.2 . Requested GI re consult for EGD/ colonoscopy as we do not have a source of bleeding at this time.She is scheduled for for EGD and colonoscopy tomorrow. NPO after midnight. Discussed with Dr Percell Miller her orthopedic surgeon who recommended no need for aspirin for DVT prophylaxis on discharge as she is ambulatory with her walker.   Details discharge summary/A&P     Syncope Probably secondary to hypotension, and severe anemia. Further work-up for syncope revealed unremarkable echocardiogram.  Troponins  have been minimally elevated, EKG shows borderline t wave abnormalities. CT of the head without contrast does not show any acute intracranial abnormalities. Orthostatic vital signson 8/2/21have been negative so far. EEG was ordered but patient refused., TSH was within normal limits. She received a total of 6 L of IV fluids and 2 units of PRBC transfusion and her hypotension has resolved. CT of the abdomen and pelvis did not reveal any retroperitoneal bleeding. It shows Wall thickening involving the cecum, ascending colon and transverse colon, possibly indicating colitis. Borderline to mild hepatomegaly. Diffuse hepatic steatosis without focal hepatic parenchymal abnormality. Pt denies any dizziness or syncope ... Currently remained stable.    Acute anemia of blood loss probably secondary to GI source due to her history of gastritis and folic acid deficiency.  She underwent an EGD in 2019 which showed erosive esophagitis, chronic gastritis chronic duodenitis consistent with peptic duodenitis. Pt also reports using NSAIDS in addition to aspirin use inthe last two weeks for DVT prophylaxis for the knee repair.  -S/p 2 units of PRBC transfusion  - Hgb  5.2 to 6.5 to 9.2 to 8.6 to 9.4 to 8.2to 8.8 >> 11.3  Dr Percell Miller recommended to discontinue aspirin/no need for aspirin on discharge for DVT prophylaxis. -Due to drop in hemoglobin again, Dr Michail Sermon consulted for possible colonoscopy / EGD. Status post GI prep overnight -scheduled for EGD  and colonoscopy today 10/15/2019  Addendum  S/P EGD/colonoscopy per, by Dr. Michail Sermon --- reporting no specific findings, with exception of grade 1 internal hemorrhoid, mild acute gastritis, clean patient for discharg   Hematology consulted, suggested that her iron studies indicates anemia of chronic disease probably relating to esophagitis, malabsorption, colitis.. Her peripheral smear was unremarkable. No indication for bone marrow biopsy at this  time.  -Recommended outpatient follow-up in the office in 2 to 3 weeks and if anemia still persistent with folic acid replacement and with adequate treatment of esophagitis, GERD, colitis then bone marrow biopsy may be indicated down the road.   GERD  continue with PPI daily.   Colitis of the cecum, ascending colon and transverse colon -Improved symptoms Patient was started on IV Rocephin and Flagylon 7/31 , antibiotics will be switched to p.o. Flagyl and ciprofloxacin for total of 10 days.. Patient deniesany nausea, vomiting or diarrhea at this time C. difficile PCR is negative on admission. GI pathogen PCR IS negative. No diarrhea so far.  Patient is currently afebrile and WBC count within normal limits.  Alcohol abuse Pt was agitated and confusedon 10/12/19, but she is much better,calm and agreed forEGD and colonoscopy.  Patient is currently on CIWA protocol and IV Haldol ordered for intermittent agitation and confusion. CT abdomen shows diffuse hepatic steatosis.    Bipolar disorder Continue with Zyprexa.   AKI Probably secondary to dehydration and poor renal perfusion Resolved with IV fluids.   Right knee meniscal tear Repair on 09/30/2019. Recommend outpatient follow up with orthopedics for suture removal.   Hyponatremia, Resolved.   Hypokalemia Replaced.    Mild metabolic acidosis Resolved with bicarbonate infusion.  Abnormal urine -Coverage with antibiotics as above  Code Status:full code.  .  Disposition:  Status is: Inpatient    Dispo: The patient is from: Home Anticipated d/c is to: Home        Discharge Instructions:   Discharge Instructions    Activity as tolerated - No restrictions   Complete by: As directed    Call MD for:  persistant nausea and vomiting   Complete by: As directed    Call MD for:  temperature >100.4   Complete by: As directed    Diet - low sodium heart healthy   Complete  by: As directed    Discharge instructions   Complete by: As directed    Please take current medication as prescribed   Increase activity slowly   Complete by: As directed    Remove dressing in 24 hours   Complete by: As directed        Medication List    STOP taking these medications   aspirin EC 81 MG tablet   cetirizine 10 MG tablet Commonly known as: ZYRTEC   clotrimazole 10 MG troche Commonly known as: MYCELEX   famotidine 20 MG tablet Commonly known as: PEPCID   HYDROcodone-acetaminophen 5-325 MG tablet Commonly known as: Norco   ibuprofen 600 MG tablet Commonly known as: ADVIL   lisinopril-hydrochlorothiazide 10-12.5 MG tablet Commonly known as: ZESTORETIC   omeprazole 40 MG capsule Commonly known as: PRILOSEC   ondansetron 4 MG disintegrating tablet Commonly known as: Zofran ODT   potassium chloride SA 20 MEQ tablet Commonly known as: KLOR-CON   triamcinolone ointment 0.1 % Commonly known as: KENALOG     TAKE these medications   acetaminophen 500 MG tablet Commonly known as: TYLENOL Take 1 tablet (500 mg total) by mouth every 8 (eight) hours as needed  for mild pain.   ciprofloxacin 500 MG tablet Commonly known as: Cipro Take 1 tablet (500 mg total) by mouth 2 (two) times daily for 10 days.   folic acid 1 MG tablet Commonly known as: FOLVITE Take 1 tablet (1 mg total) by mouth daily.   gabapentin 300 MG capsule Commonly known as: NEURONTIN Take 1 capsule (300 mg total) by mouth 3 (three) times daily.   hydrOXYzine 25 MG capsule Commonly known as: VISTARIL Take 1 capsule (25 mg total) by mouth daily as needed for anxiety.   lactobacillus acidophilus & bulgar chewable tablet Chew 1 tablet by mouth 3 (three) times daily with meals for 15 days.   magnesium oxide 400 MG tablet Commonly known as: MAG-OX Take 1 tablet (400 mg total) by mouth daily.   metroNIDAZOLE 500 MG tablet Commonly known as: Flagyl Take 1 tablet (500 mg total) by mouth  3 (three) times daily for 10 days. What changed: when to take this   multivitamin with minerals Tabs tablet Take 1 tablet by mouth daily.   OLANZapine 5 MG tablet Commonly known as: ZYPREXA Take 1 tablet (5 mg total) by mouth at bedtime.   ondansetron 4 MG tablet Commonly known as: Zofran Take 1 tablet (4 mg total) by mouth every 8 (eight) hours as needed for nausea or vomiting.   pantoprazole 40 MG tablet Commonly known as: PROTONIX Take 1 tablet (40 mg total) by mouth daily.   polyethylene glycol 17 g packet Commonly known as: MIRALAX / GLYCOLAX Take 17 g by mouth daily as needed for mild constipation.   thiamine 100 MG tablet Take 1 tablet (100 mg total) by mouth daily.   tiZANidine 4 MG tablet Commonly known as: Zanaflex Take 1-2 tablets (4-8 mg total) by mouth every 6 (six) hours as needed for muscle spasms.       Follow-up Information    Clarene Essex, MD. Schedule an appointment as soon as possible for a visit in 1 week(s).   Specialty: Gastroenterology Contact information: [address] [place] [address] [phone]        Truitt Merle, MD. Schedule an appointment as soon as possible for a visit in 1 week(s).   Specialties: Hematology, Oncology Contact information: [address] [place] [address] [phone]              Allergies  Allergen Reactions  . Sumatriptan Anaphylaxis    High blood pressure and numb  . Other     Bananas-stomach cramps  . Eggs Or Egg-Derived Products Rash and Other (See Comments)    Stomach cramps     Procedures /Studies:   DG Knee 1-2 Views Right  Result Date: 10/10/2019 CLINICAL DATA:  Pain.  Recent knee arthroscopy. EXAM: RIGHT KNEE - 1-2 VIEW COMPARISON:  January 12, 2019 FINDINGS: No acute fracture or dislocation. Mild degenerative changes of the medial and patellofemoral compartments. Enthesiophyte at the quadriceps tendon insertion on the patella. There is a new well corticated  ossific density projecting over the posterior joint space in comparison to prior. This may reflect a loose body versus new osteophyte. No area of erosion or osseous destruction. No unexpected radiopaque foreign body. Soft tissues are unremarkable. IMPRESSION: 1. New well corticated ossific density projecting over the posterior joint space in comparison to prior. This may reflect a loose body versus new osteophyte. 2. Mild medial and patellofemoral compartment osteoarthritis. Electronically Signed   By: Valentino Saxon MD   On: 10/10/2019 09:17  CT HEAD WO CONTRAST  Result Date: 10/10/2019 CLINICAL DATA:  Mental status change EXAM: CT HEAD WITHOUT CONTRAST TECHNIQUE: Contiguous axial images were obtained from the base of the skull through the vertex without intravenous contrast. COMPARISON:  None. FINDINGS: Brain: There is no acute intracranial hemorrhage, mass effect, or edema. Gray-white differentiation is preserved. There is no extra-axial fluid collection. Ventricles and sulci are within normal limits in size and configuration. Patchy hypoattenuation in the supratentorial white matter is nonspecific but may reflect mild early microvascular ischemic changes or gliosis/demyelination of other etiologies. Vascular: No hyperdense vessel or unexpected calcification. Skull: Calvarium is unremarkable. Sinuses/Orbits: No acute finding. Other: None. IMPRESSION: No acute intracranial hemorrhage, mass effect, or evidence of acute infarction. Electronically Signed   By: Macy Mis M.D.   On: 10/10/2019 15:04   CT ANGIO CHEST PE W OR WO CONTRAST  Result Date: 10/11/2019 CLINICAL DATA:  44 year old presenting with multiple recent syncopal episodes, three-month history of nausea and vomiting, 2 week history of diarrhea, and acute severe generalized abdominal pain. Elevated D-dimer. EXAM: CT ANGIOGRAPHY CHEST CT ABDOMEN AND PELVIS WITH CONTRAST TECHNIQUE: Multidetector CT imaging of the chest was performed using  the standard protocol during bolus administration of intravenous contrast. Multiplanar CT image reconstructions and MIPs were obtained to evaluate the vascular anatomy. Multidetector CT imaging of the abdomen and pelvis was performed using the standard protocol during bolus administration of intravenous contrast. CONTRAST:  161m OMNIPAQUE IOHEXOL 350 MG/ML IV. COMPARISON:  CT abdomen and pelvis 03/20/2018 and earlier. No prior chest CT. FINDINGS: CTA CHEST FINDINGS Cardiovascular: Contrast opacification of the pulmonary arteries is very good. Respiratory motion blurred images in the mid and upper lungs. Overall, the study is of good diagnostic quality. No filling defects within either main pulmonary artery or their segmental branches in either lung to suggest pulmonary embolism. Normal heart size. No pericardial effusion. No visible coronary atherosclerosis. No visible atherosclerosis involving the thoracic or proximal abdominal aorta or their visible branches. No evidence of aneurysm. Mediastinum/Nodes: Numerous normal sized lymph nodes throughout the mediastinum and in both hila. No significant lymphadenopathy. Normal appearing esophagus. Visualized thyroid gland normal in appearance. Lungs/Pleura: 3 mm nodule in the RIGHT MIDDLE LOBE (8/80). No pulmonary parenchymal nodules or masses elsewhere in either lung. No confluent or ground-glass airspace consolidation. No evidence of interstitial lung disease. Central airways patent without significant bronchial wall thickening. No pleural effusions. Musculoskeletal: Mild degenerative disc disease and spondylosis involving the UPPER and lower thoracic spine. No acute findings. Review of the MIP images confirms the above findings. CT ABDOMEN and PELVIS FINDINGS Hepatobiliary: Borderline to mild hepatomegaly. Diffuse hepatic steatosis without focal hepatic parenchymal abnormality. Gallbladder normal in appearance without calcified gallstones. No biliary ductal dilation.  Pancreas: Normal in appearance without evidence of mass, ductal dilation, or inflammation. Spleen: Normal in size and appearance. Adrenals/Urinary Tract: Normal appearing adrenal glands. Kidneys normal in size and appearance without focal parenchymal abnormality. No hydronephrosis. No evidence of urinary tract calculi. Decompressed urinary bladder with mild circumferential wall thickening which is somewhat out of proportion to that expected for the decompressed state. Stomach/Bowel: Stomach normal in appearance for the degree of distention. Normal-appearing small bowel. Tortuous and redundant sigmoid colon expected colonic stool burden. Wall thickening involving the cecum, ascending colon and transverse colon. Normal appendix in the RIGHT mid and upper pelvis. Vascular/Lymphatic: No visible aortoiliofemoral atherosclerosis. Widely patent visceral arteries. Normal-appearing portal venous and systemic venous systems. No pathologic lymphadenopathy. Reproductive: Normal-appearing uterus and ovaries without evidence of adnexal  mass. Other: Very small supraumbilical midline ANTERIOR abdominal wall hernia containing fat and fluid. Musculoskeletal: Regional skeleton unremarkable without acute or significant osseous abnormality. IMPRESSION: 1. No evidence of pulmonary embolism. 2. No acute cardiopulmonary disease. 3. 3 mm nodule involving the RIGHT MIDDLE LOBE. No follow-up needed if patient is low-risk. Non-contrast chest CT can be considered in 12 months if patient is high-risk. This recommendation follows the consensus statement: Guidelines for Management of Incidental Pulmonary Nodules Detected on CT Images: From the Fleischner Society 2017; Radiology 2017; 284:228-243. 4. Wall thickening involving the cecum, ascending colon and transverse colon, possibly indicating colitis. 5. Borderline to mild hepatomegaly. Diffuse hepatic steatosis without focal hepatic parenchymal abnormality. 6. Very small supraumbilical midline  ANTERIOR abdominal wall hernia containing fat and fluid. 7. Mild circumferential wall thickening involving the urinary bladder which is somewhat out of proportion to that expected for the decompressed state, unchanged since the CT in January, 2020, query cystitis (interstitial cystitis?). Electronically Signed   By: Evangeline Dakin M.D.   On: 10/11/2019 14:22   CT ABDOMEN PELVIS W CONTRAST  Result Date: 10/11/2019 CLINICAL DATA:  44 year old presenting with multiple recent syncopal episodes, three-month history of nausea and vomiting, 2 week history of diarrhea, and acute severe generalized abdominal pain. Elevated D-dimer. EXAM: CT ANGIOGRAPHY CHEST CT ABDOMEN AND PELVIS WITH CONTRAST TECHNIQUE: Multidetector CT imaging of the chest was performed using the standard protocol during bolus administration of intravenous contrast. Multiplanar CT image reconstructions and MIPs were obtained to evaluate the vascular anatomy. Multidetector CT imaging of the abdomen and pelvis was performed using the standard protocol during bolus administration of intravenous contrast. CONTRAST:  167m OMNIPAQUE IOHEXOL 350 MG/ML IV. COMPARISON:  CT abdomen and pelvis 03/20/2018 and earlier. No prior chest CT. FINDINGS: CTA CHEST FINDINGS Cardiovascular: Contrast opacification of the pulmonary arteries is very good. Respiratory motion blurred images in the mid and upper lungs. Overall, the study is of good diagnostic quality. No filling defects within either main pulmonary artery or their segmental branches in either lung to suggest pulmonary embolism. Normal heart size. No pericardial effusion. No visible coronary atherosclerosis. No visible atherosclerosis involving the thoracic or proximal abdominal aorta or their visible branches. No evidence of aneurysm. Mediastinum/Nodes: Numerous normal sized lymph nodes throughout the mediastinum and in both hila. No significant lymphadenopathy. Normal appearing esophagus. Visualized thyroid  gland normal in appearance. Lungs/Pleura: 3 mm nodule in the RIGHT MIDDLE LOBE (8/80). No pulmonary parenchymal nodules or masses elsewhere in either lung. No confluent or ground-glass airspace consolidation. No evidence of interstitial lung disease. Central airways patent without significant bronchial wall thickening. No pleural effusions. Musculoskeletal: Mild degenerative disc disease and spondylosis involving the UPPER and lower thoracic spine. No acute findings. Review of the MIP images confirms the above findings. CT ABDOMEN and PELVIS FINDINGS Hepatobiliary: Borderline to mild hepatomegaly. Diffuse hepatic steatosis without focal hepatic parenchymal abnormality. Gallbladder normal in appearance without calcified gallstones. No biliary ductal dilation. Pancreas: Normal in appearance without evidence of mass, ductal dilation, or inflammation. Spleen: Normal in size and appearance. Adrenals/Urinary Tract: Normal appearing adrenal glands. Kidneys normal in size and appearance without focal parenchymal abnormality. No hydronephrosis. No evidence of urinary tract calculi. Decompressed urinary bladder with mild circumferential wall thickening which is somewhat out of proportion to that expected for the decompressed state. Stomach/Bowel: Stomach normal in appearance for the degree of distention. Normal-appearing small bowel. Tortuous and redundant sigmoid colon expected colonic stool burden. Wall thickening involving the cecum, ascending colon and transverse colon. Normal  appendix in the RIGHT mid and upper pelvis. Vascular/Lymphatic: No visible aortoiliofemoral atherosclerosis. Widely patent visceral arteries. Normal-appearing portal venous and systemic venous systems. No pathologic lymphadenopathy. Reproductive: Normal-appearing uterus and ovaries without evidence of adnexal mass. Other: Very small supraumbilical midline ANTERIOR abdominal wall hernia containing fat and fluid. Musculoskeletal: Regional skeleton  unremarkable without acute or significant osseous abnormality. IMPRESSION: 1. No evidence of pulmonary embolism. 2. No acute cardiopulmonary disease. 3. 3 mm nodule involving the RIGHT MIDDLE LOBE. No follow-up needed if patient is low-risk. Non-contrast chest CT can be considered in 12 months if patient is high-risk. This recommendation follows the consensus statement: Guidelines for Management of Incidental Pulmonary Nodules Detected on CT Images: From the Fleischner Society 2017; Radiology 2017; 284:228-243. 4. Wall thickening involving the cecum, ascending colon and transverse colon, possibly indicating colitis. 5. Borderline to mild hepatomegaly. Diffuse hepatic steatosis without focal hepatic parenchymal abnormality. 6. Very small supraumbilical midline ANTERIOR abdominal wall hernia containing fat and fluid. 7. Mild circumferential wall thickening involving the urinary bladder which is somewhat out of proportion to that expected for the decompressed state, unchanged since the CT in January, 2020, query cystitis (interstitial cystitis?). Electronically Signed   By: Evangeline Dakin M.D.   On: 10/11/2019 14:22   DG CHEST PORT 1 VIEW  Result Date: 10/10/2019 CLINICAL DATA:  Hypotension. EXAM: PORTABLE CHEST 1 VIEW COMPARISON:  None. FINDINGS: The heart size and mediastinal contours are within normal limits. Both lungs are clear. The visualized skeletal structures are unremarkable. IMPRESSION: No active disease. Electronically Signed   By: Marijo Conception M.D.   On: 10/10/2019 16:42   DG Abd 2 Views  Result Date: 10/10/2019 CLINICAL DATA:  Abdominal tenderness with nausea and vomiting EXAM: ABDOMEN - 2 VIEW COMPARISON:  Bowel FINDINGS: Bowel gas pattern is unremarkable. There are no significantly dilated loops. No significant stool burden. No air-fluid levels or free air. IMPRESSION: Unremarkable bowel gas pattern. Electronically Signed   By: Macy Mis M.D.   On: 10/10/2019 14:12   ECHOCARDIOGRAM  COMPLETE  Result Date: 10/10/2019    ECHOCARDIOGRAM REPORT   Patient Name:   AADVIKA KONEN Date of Exam: 10/10/2019 Medical Rec #:  [id number]      Height:       64.5 in Accession #:    [id number]     Weight:       169.5 lb Date of Birth:  [birth year]     BSA:          1.833 m Patient Age:    53 years       BP:           128/86 mmHg Patient Gender: F              HR:           90 bpm. Exam Location:  Inpatient Procedure: 2D Echo, Cardiac Doppler and Color Doppler Indications:    Syncope 780.2 / R55  History:        Patient has no prior history of Echocardiogram examinations.                 Signs/Symptoms:Syncope; Risk Factors:Hypertension and Current                 Smoker.  Sonographer:    Vickie Epley RDCS Referring Phys: [id number] [facility] LAT[facility] [place]  1. Left ventricular ejection fraction, by estimation, is 55 to 60%. The left ventricle has normal function. The left ventricle has no regional  wall motion abnormalities. Left ventricular diastolic parameters were normal.  2. Right ventricular systolic function is normal. The right ventricular size is normal.  3. The mitral valve is normal in structure. No evidence of mitral valve regurgitation. No evidence of mitral stenosis.  4. The aortic valve is normal in structure. Aortic valve regurgitation is not visualized. No aortic stenosis is present.  5. The inferior vena cava is normal in size with greater than 50% respiratory variability, suggesting right atrial pressure of 3 mmHg. FINDINGS  Left Ventricle: Left ventricular ejection fraction, by estimation, is 55 to 60%. The left ventricle has normal function. The left ventricle has no regional wall motion abnormalities. The left ventricular internal cavity size was normal in size. There is  no left ventricular hypertrophy. Left ventricular diastolic parameters were normal. Normal left ventricular filling pressure. Right Ventricle: The right ventricular size is normal. No increase in right ventricular wall  thickness. Right ventricular systolic function is normal. Left Atrium: Left atrial size was not well visualized. Right Atrium: Right atrial size was not well visualized. Pericardium: There is no evidence of pericardial effusion. Mitral Valve: The mitral valve is normal in structure. Normal mobility of the mitral valve leaflets. No evidence of mitral valve regurgitation. No evidence of mitral valve stenosis. Tricuspid Valve: The tricuspid valve is normal in structure. Tricuspid valve regurgitation is trivial. No evidence of tricuspid stenosis. Aortic Valve: The aortic valve is normal in structure. Aortic valve regurgitation is not visualized. No aortic stenosis is present. Pulmonic Valve: The pulmonic valve was normal in structure. Pulmonic valve regurgitation is not visualized. No evidence of pulmonic stenosis. Aorta: The aortic root is normal in size and structure. Venous: The inferior vena cava is normal in size with greater than 50% respiratory variability, suggesting right atrial pressure of 3 mmHg. IAS/Shunts: No atrial level shunt detected by color flow Doppler.  LEFT VENTRICLE PLAX 2D LVIDd:         5.24 cm      Diastology LVIDs:         3.80 cm      LV e' lateral:   13.30 cm/s LV PW:         0.68 cm      LV E/e' lateral: 3.8 LV IVS:        0.70 cm      LV e' medial:    9.03 cm/s LVOT diam:     2.10 cm      LV E/e' medial:  5.6 LV SV:         62 LV SV Index:   34 LVOT Area:     3.46 cm  LV Volumes (MOD) LV vol d, MOD A2C: 107.0 ml LV vol d, MOD A4C: 128.0 ml LV vol s, MOD A2C: 57.4 ml LV vol s, MOD A4C: 58.0 ml LV SV MOD A2C:     49.6 ml LV SV MOD A4C:     128.0 ml LV SV MOD BP:      60.5 ml RIGHT VENTRICLE RV S prime:     11.60 cm/s TAPSE (M-mode): 1.6 cm LEFT ATRIUM             Index       RIGHT ATRIUM          Index LA diam:        2.80 cm 1.53 cm/m  RA Area:     7.33 cm LA Vol (A2C):   16.9 ml 9.22 ml/m  RA Volume:  11.70 ml 6.38 ml/m LA Vol (A4C):   21.7 ml 11.84 ml/m LA Biplane Vol: 21.2 ml 11.56  ml/m  AORTIC VALVE LVOT Vmax:   106.00 cm/s LVOT Vmean:  72.300 cm/s LVOT VTI:    0.179 m  AORTA Ao Root diam: 3.30 cm MITRAL VALVE MV Area (PHT): 4.06 cm    SHUNTS MV Decel Time: 187 msec    Systemic VTI:  0.18 m MV E velocity: 50.90 cm/s  Systemic Diam: 2.10 cm MV A velocity: 51.40 cm/s MV E/A ratio:  0.99 Fransico Him MD Electronically signed by Fransico Him MD Signature Date/Time: 10/10/2019/12:52:28 PM    Final    US Abdomen Limited RUQ  Result Date: 10/10/2019 CLINICAL DATA:  Abdominal pain EXAM: ULTRASOUND ABDOMEN LIMITED RIGHT UPPER QUADRANT COMPARISON:  03/20/2018 FINDINGS: Gallbladder: Under distended gallbladder without wall thickening. There is dependent sludge. No shadowing gallstone or focal tenderness. Common bile duct: Diameter: 6 mm Liver: Mildly echogenic. No focal hepatic lesion. Portal vein is patent on color Doppler imaging with normal direction of blood flow towards the liver. IMPRESSION: 1. Gallbladder sludge.  No evidence of cholecystitis. 2. Hepatic steatosis. Electronically Signed   By: Monte Fantasia M.D.   On: 10/10/2019 14:44    Subjective:   Patient was seen and examined 10/15/2019, 2:21 PM Patient stable today. No acute distress.  No issues overnight Stable for discharge.  Discharge Exam:    Vitals:   10/15/19 1105 10/15/19 1110 10/15/19 1120 10/15/19 1125  BP: (!) 149/103 (!) 155/102 (!) 143/107 (!) 150/98  Pulse: 81 74 68 68  Resp: _0 Temp: 98.1 F (36.7 C)     TempSrc: Oral     SpO2: 100% 100% 100% 100%  Weight:      Height:        General: Pt lying comfortably in bed & appears in no obvious distress. Cardiovascular: S1 & S2 heard, RRR, S1/S2 +. No murmurs, rubs, gallops or clicks. No JVD or pedal edema. Respiratory: Clear to auscultation without wheezing, rhonchi or crackles. No increased work of breathing. Abdominal:  Non-distended, non-tender & soft. No organomegaly or masses appreciated. Normal bowel sounds heard. CNS: Alert and  oriented. No focal deficits. Extremities: no edema, no cyanosis    The results of significant diagnostics from this hospitalization (including imaging, microbiology, ancillary and laboratory) are listed below for reference.      Microbiology:   Recent Results (from the past 240 hour(s))  Culture, group A strep (throat)     Status: None   Collection Time: 10/05/19  5:52 PM   Specimen: Throat  Result Value Ref Range Status   Specimen Description THROAT  Final   Special Requests NONE  Final   Culture   Final    NO GROUP A STREP (S.PYOGENES) ISOLATED Performed at [facility], [address] [place],  [address]    Report Status 10/08/2019 FINAL  Final  SARS Coronavirus 2 by RT PCR (hospital order, performed in [facility] hospital lab) Nasopharyngeal Nasopharyngeal Swab     Status: None   Collection Time: 10/10/19  8:33 AM   Specimen: Nasopharyngeal Swab  Result Value Ref Range Status   SARS Coronavirus 2 NEGATIVE NEGATIVE Final    Comment: (NOTE) SARS-CoV-2 target nucleic acids are NOT DETECTED.  The SARS-CoV-2 RNA is generally detectable in upper and lower respiratory specimens during the acute phase of infection. The lowest concentration of SARS-CoV-2 viral copies this assay can detect is 250 copies /  mL. A negative result does not preclude SARS-CoV-2 infection and should not be used as the sole basis for treatment or other patient management decisions.  A negative result may occur with improper specimen collection / handling, submission of specimen other than nasopharyngeal swab, presence of viral mutation(s) within the areas targeted by this assay, and inadequate number of viral copies (<250 copies / mL). A negative result must be combined with clinical observations, patient history, and epidemiological information.  Fact Sheet for Patients:   StrictlyIdeas.no  Fact Sheet for Healthcare  Providers: BankingDealers.co.za  This test is not yet approved or  cleared by the [place] FDA and has been authorized for detection and/or diagnosis of SARS-CoV-2 by FDA under an Emergency Use Authorization (EUA).  This EUA will remain in effect (meaning this test can be used) for the duration of the COVID-19 declaration under Section 564(b)(1) of the Act, 21 U.S.C. section 360bbb-3(b)(1), unless the authorization is terminated or revoked sooner.  Performed at [facility], [place] [address], [place], [place] [address]   Culture, blood (routine x 2)     Status: None   Collection Time: 10/10/19  1:57 PM   Specimen: BLOOD  Result Value Ref Range Status   Specimen Description   Final    BLOOD RIGHT ANTECUBITAL Performed at [place] [address], [place], [place] [address]    Special Requests   Final    BOTTLES DRAWN AEROBIC AND ANAEROBIC Blood Culture adequate volume Performed at [place] [address] [place], [place] [address]    Culture   Final    NO GROWTH 5 DAYS Performed at [facility], [place] [address], [place], [place] [address]    Report Status 10/15/2019 FINAL  Final  Culture, blood (routine x 2)     Status: None   Collection Time: 10/10/19  1:57 PM   Specimen: BLOOD  Result Value Ref Range Status   Specimen Description   Final    BLOOD LEFT ANTECUBITAL Performed at [facility] [place] [address], [place], [place] [address]    Special Requests   Final    BOTTLES DRAWN AEROBIC AND ANAEROBIC Blood Culture adequate volume Performed at [place] [address], [place], [place] [address]    Culture   Final    NO GROWTH 5 DAYS Performed at [facility], [place] [address], [place], [place] [address]    Report Status 10/15/2019 FINAL  Final  MRSA PCR Screening     Status: None   Collection Time: 10/10/19 10:45 PM   Specimen:  Nasopharyngeal  Result Value Ref Range Status   MRSA by PCR NEGATIVE NEGATIVE Final    Comment:        The GeneXpert MRSA Assay (FDA approved for NASAL specimens only), is one component of a comprehensive MRSA colonization surveillance program. It is not intended to diagnose MRSA infection nor to guide or monitor treatment for MRSA infections. Performed at [facility], [place] [address], [place], [place] [address]   Gastrointestinal Panel by PCR , Stool     Status: None   Collection Time: 10/11/19  4:05 PM   Specimen: Rectum; Stool  Result Value Ref Range Status   Campylobacter species NOT DETECTED NOT DETECTED Final   Plesimonas shigelloides NOT DETECTED NOT DETECTED Final   Salmonella species NOT DETECTED NOT DETECTED Final   Yersinia enterocolitica NOT DETECTED NOT DETECTED Final   Vibrio  species NOT DETECTED NOT DETECTED Final   Vibrio cholerae NOT DETECTED NOT DETECTED Final   Enteroaggregative E coli (EAEC) NOT DETECTED NOT DETECTED Final   Enteropathogenic E coli (EPEC) NOT DETECTED NOT DETECTED Final   Enterotoxigenic E coli (ETEC) NOT DETECTED NOT DETECTED Final   Shiga like toxin producing E coli (STEC) NOT DETECTED NOT DETECTED Final   Shigella/Enteroinvasive E coli (EIEC) NOT DETECTED NOT DETECTED Final   Cryptosporidium NOT DETECTED NOT DETECTED Final   Cyclospora cayetanensis NOT DETECTED NOT DETECTED Final   Entamoeba histolytica NOT DETECTED NOT DETECTED Final   Giardia lamblia NOT DETECTED NOT DETECTED Final   Adenovirus F40/41 NOT DETECTED NOT DETECTED Final   Astrovirus NOT DETECTED NOT DETECTED Final   Norovirus GI/GII NOT DETECTED NOT DETECTED Final   Rotavirus A NOT DETECTED NOT DETECTED Final   Sapovirus (I, II, IV, and V) NOT DETECTED NOT DETECTED Final    Comment: Performed at [facility], [address] [place], [place] [address]  C Difficile Quick Screen w PCR reflex     Status: None   Collection Time: 10/11/19  4:05 PM     Specimen: Rectum; Stool  Result Value Ref Range Status   C Diff antigen NEGATIVE NEGATIVE Final   C Diff toxin NEGATIVE NEGATIVE Final   C Diff interpretation No C. difficile detected.  Final    Comment: Performed at [facility], [address] [place], [place] [address]     Labs:   CBC: Recent Labs  Lab 10/10/19 [phone] 10/10/19 [phone] 10/10/19 1319 10/10/19 1520 10/11/19 0536 10/12/19 0436 10/12/19 1644 10/13/19 1034 10/14/19 0844  WBC 7.2   < > 5.1  --  6.2 5.9  --  4.6 5.4  NEUTROABS 4.9  --  2.9  --  4.1 3.4  --   --  3.1  HGB 7.8*   < > 5.2*   < > 9.2* 8.6* 9.4* 8.2* 8.8*  HCT 23.5*   < > 15.8*   < > 28.0* 25.1* 28.0* 24.1* 26.2*  MCV 91.8   < > 94.6  --  93.3 88.4  --  90.3 91.9  PLT 175   < > 114*  --  132* 149*  --  160 198   < > = values in this interval not displayed.   Basic Metabolic Panel: Recent Labs  Lab 10/10/19 0712 10/10/19 1319 10/11/19 0536 10/12/19 0436 10/14/19 0844  NA 125* 132* 134* 137 139  K 2.7* 3.3* 3.9 4.1 3.8  CL 90* 105 109 106 104  CO2 20* 18* 14* 23 25  GLUCOSE 106* 99 105* 92 92  BUN _0 <5* <5*  CREATININE 2.06* 1.66* 0.88 0.51 0.55  CALCIUM 7.7* 6.3* 7.3* 8.3* 8.3*  MG  --  1.1* 1.9 1.8  --   PHOS  --  3.2 2.9 3.3  --    Liver Function Tests: Recent Labs  Lab 10/10/19 0712 10/10/19 1319 10/11/19 0536 10/12/19 0436  AST 49* 30 41 33  ALT _1 ALKPHOS 133* 79 106 93  BILITOT 0.7 0.2* 0.5 0.5  PROT 6.2* 4.0* 5.3* 5.1*  ALBUMIN 3.4* 2.2* 2.9* 2.7*   BNP (last 3 results) No results for input(s): BNP in the last 8760 hours. Cardiac Enzymes: No results for input(s): CKTOTAL, CKMB, CKMBINDEX, TROPONINI in the last 168 hours. CBG: Recent Labs  Lab 10/10/19 1303 10/11/19 0804 10/13/19 0835 10/14/19 0751 10/15/19 0742  GLUCAP 132* 117* 87  87  87 78   Hgb A1c No results for input(s): HGBA1C in the last 72 hours. Lipid Profile No results for input(s): CHOL, HDL, LDLCALC, TRIG,  CHOLHDL, LDLDIRECT in the last 72 hours. Thyroid function studies No results for input(s): TSH, T4TOTAL, T3FREE, THYROIDAB in the last 72 hours.  Invalid input(s): FREET3 Anemia work up No results for input(s): VITAMINB12, FOLATE, FERRITIN, TIBC, IRON, RETICCTPCT in the last 72 hours. Urinalysis    Component Value Date/Time   COLORURINE YELLOW 10/10/2019 0738   APPEARANCEUR HAZY (A) 10/10/2019 0738   LABSPEC 1.015 10/10/2019 0738   PHURINE 5.0 10/10/2019 0738   GLUCOSEU NEGATIVE 10/10/2019 0738   HGBUR SMALL (A) 10/10/2019 0738   BILIRUBINUR NEGATIVE 10/10/2019 0738   BILIRUBINUR moderate (A) 09/23/2019 1652   BILIRUBINUR NEG 04/22/2014 1630   KETONESUR NEGATIVE 10/10/2019 0738   PROTEINUR 30 (A) 10/10/2019 0738   UROBILINOGEN 2.0 (A) 09/23/2019 1652   UROBILINOGEN 0.2 01/12/2019 1329   NITRITE NEGATIVE 10/10/2019 0738   LEUKOCYTESUR MODERATE (A) 10/10/2019 0738         Time coordinating discharge: Over 45 minutes  SIGNED: Deatra James, MD, FACP, [facility]. [facility],  Please use [email or web address] to Page If 7PM-7AM, please contact night-coverage [email or web address], Password [facility] 10/15/2019, 2:21 PM

## 2019-10-15 NOTE — Op Note (Signed)
[facility] Patient Name: Kristin [name] Procedure Date: 10/15/2019 MRN: [id number] Attending MD: Shirley Friar , MD Date of Birth: 09/18/1975 CSN: [id number] Age: 44 Admit Type: Inpatient Procedure:                Upper GI endoscopy Indications:              Iron deficiency anemia Providers:                Shirley Friar, MD, Dwain Sarna, RN, Michele Mcalpine Technician, Marlena Clipper, CRNA Referring MD:             hospital team Medicines:                Propofol per Anesthesia, Monitored Anesthesia Care Complications:            No immediate complications. Estimated Blood Loss:     Estimated blood loss was minimal. Procedure:                Pre-Anesthesia Assessment:                           - Prior to the procedure, a History and Physical                            was performed, and patient medications and                            allergies were reviewed. The patient's tolerance of                            previous anesthesia was also reviewed. The risks                            and benefits of the procedure and the sedation                            options and risks were discussed with the patient.                            All questions were answered, and informed consent                            was obtained. Prior Anticoagulants: The patient has                            taken no previous anticoagulant or antiplatelet                            agents. ASA Grade Assessment: II - A patient with                            mild systemic disease. After reviewing the risks  and benefits, the patient was deemed in                            satisfactory condition to undergo the procedure.                           After obtaining informed consent, the endoscope was                            passed under direct vision. Throughout the                            procedure, the patient's blood  pressure, pulse, and                            oxygen saturations were monitored continuously. The                            GIF-H190 ([id number] Olympus gastroscope was                            introduced through the mouth, and advanced to the                            second part of duodenum. The upper GI endoscopy was                            accomplished without difficulty. The patient                            tolerated the procedure well. Scope In: Scope Out: Findings:      The examined esophagus was normal.      Segmental mild inflammation characterized by congestion (edema) and       erythema was found in the prepyloric region of the stomach. Biopsies       were taken with a cold forceps for histology. Estimated blood loss was       minimal.      The cardia and gastric fundus were normal on retroflexion.      The examined duodenum was normal. Biopsies were taken with a cold       forceps for histology. Estimated blood loss was minimal. Impression:               - Normal esophagus.                           - Acute gastritis. Biopsied.                           - Normal examined duodenum. Biopsied. Moderate Sedation:      Not Applicable - Patient had care per Anesthesia. Recommendation:           - Advance diet as tolerated and soft diet.                           - Await pathology results. Procedure Code(s):        ---  Professional ---                           [phone], Esophagogastroduodenoscopy, flexible,                            transoral; with biopsy, single or multiple Diagnosis Code(s):        --- Professional ---                           D50.9, Iron deficiency anemia, unspecified                           K29.00, Acute gastritis without bleeding CPT copyright 2019 American Medical Association. All rights reserved. The codes documented in this report are preliminary and upon coder review may  be revised to meet current compliance requirements. Shirley Friar, MD 10/15/2019 11:04:49 AM This report has been signed electronically. Number of Addenda: 0

## 2019-10-15 NOTE — Transfer of Care (Signed)
Immediate Anesthesia Transfer of Care Note  [name]  Procedure(s) Performed: ESOPHAGOGASTRODUODENOSCOPY (EGD) (N/A ) COLONOSCOPY (N/A ) BIOPSY  Patient Location: PACU and Endoscopy Unit  Anesthesia Type:MAC  Level of Consciousness: awake, alert , oriented and patient cooperative  Airway & Oxygen Therapy: Patient Spontanous Breathing and Patient connected to face mask oxygen  Post-op Assessment: Report given to RN and Post -op Vital signs reviewed and stable  Post vital signs: Reviewed and stable  Last Vitals:  Vitals Value Taken Time  BP 149/103 10/15/19 1105  Temp    Pulse 81 10/15/19 1106  Resp 19 10/15/19 1106  SpO2 100 % 10/15/19 1106  Vitals shown include unvalidated device data.  Last Pain:  Vitals:   10/15/19 0911  TempSrc: Oral  PainSc: 0-No pain      Patients Stated Pain Goal: 2 (02/58/52 7782)  Complications: No complications documented.

## 2019-10-15 NOTE — Discharge Instructions (Signed)

## 2019-10-15 NOTE — Progress Notes (Signed)
PROGRESS NOTE    [name]                            PCP: System, Pcp Not In                    DOB: 1975/07/19            DOA: 10/10/2019 EXB:[id number]             DOS: 10/15/2019, 10:57 AM   LOS: 4 days   Date of Service: The patient was seen and examined on 10/15/2019  Subjective:  The patient was seen and examined this morning, remained stable, partial prep would go likely overnight.. Intolerable to complete the whole prep, reporting of multiple bowel movements overnight. Believe the prep is adequate Otherwise stable, anticipating EGD colonoscopy today.  Brief Narrative:  Per HPI  44 year old lady prior history of GERD, recent acute lateral meniscus tear of the right knee s/p repair on the 09/30/2019, anxiety, depression, bipolar disorder, hypertension, alcohol abuse presents with syncope associated with nausea vomiting diarrhea and fatigue mostly after recent knee procedure pain.  On arrival to ED she was found to have a hemoglobin of 5 underwent 2 units of PRBC transfusion and hemoglobin has improved to 9.  GI was consulted for evaluation of anemia of unclear etiology.  GI initially outpatient follow-up with an EGD and colonoscopy.  Her stool for occult blood is negative at this time. Overnight her hemoglobin dropped from 9.4 to 8.2 . Requested GI re consult for EGD/ colonoscopy as we do not have a source of bleeding at this time. She is scheduled for for EGD and colonoscopy tomorrow. NPO after midnight.  Discussed with Dr Percell Miller her orthopedic surgeon who recommended no need for aspirin for DVT prophylaxis on discharge as she is ambulatory with her walker.   Assessment & Plan:   Active Problems:   Osteoarthritis of right knee   AKI (acute kidney injury) ([place])   Bipolar disorder in remission ([place])   Dehydration   Esophagitis determined by endoscopy   Syncope   Anemia    Syncope Probably secondary to hypotension, and severe anemia. Further work-up for syncope  revealed unremarkable echocardiogram.  Troponins have been minimally elevated, EKG shows borderline t wave abnormalities.  CT of the head without contrast does not show any acute intracranial abnormalities. Orthostatic vital signs on 10/13/19 have been negative so far. EEG was ordered but patient refused.,  TSH was within normal limits. She received a total of 6 L of IV fluids and 2 units of PRBC transfusion and her hypotension has resolved. CT of the abdomen and pelvis did not reveal any retroperitoneal bleeding.  It shows Wall thickening involving the cecum, ascending colon and transverse colon, possibly indicating colitis.  Borderline to mild hepatomegaly. Diffuse hepatic steatosis without focal hepatic parenchymal abnormality. Pt denies any dizziness or syncope ... Currently remained stable.     Acute anemia of blood loss probably secondary to GI source due to her history of gastritis and folic acid deficiency.  She underwent an EGD in 2019 which showed erosive esophagitis, chronic gastritis chronic duodenitis consistent with peptic duodenitis. Pt also reports using NSAIDS in addition to aspirin use in the last two weeks for DVT prophylaxis for the knee repair.  -S/p 2 units of PRBC transfusion  - Hgb  5.2 to 6.5 to 9.2 to 8.6 to 9.4 to 8.2 to 8.8 >> 11.3  Dr Percell Miller recommended to discontinue aspirin/no need for aspirin on discharge for DVT prophylaxis. -Due to drop in hemoglobin again, Dr Michail Sermon consulted for possible colonoscopy / EGD.  Status post GI prep overnight -scheduled for EGD and colonoscopy today 10/15/2019   Hematology consulted, suggested that her iron studies indicates anemia of chronic disease probably relating to esophagitis, malabsorption, colitis..  Her peripheral smear was unremarkable. No indication for bone marrow biopsy at this time.   -Recommended outpatient follow-up in the office in 2 to 3 weeks and if anemia still persistent with folic acid replacement and  with adequate treatment of esophagitis, GERD, colitis then bone marrow biopsy may be indicated down the road.   GERD  continue with PPI daily.   Colitis of the cecum, ascending colon and transverse colon -Improved symptoms Patient was started on IV Rocephin and Flagyl on 7/31 , plan for 7 days of antibiotics.  Patient denies any nausea, vomiting or diarrhea at this time C. difficile PCR is negative on admission. GI pathogen PCR IS negative. No diarrhea so far.  Patient is currently afebrile and WBC count within normal limits.  Alcohol abuse Pt was agitated and confused on 10/12/19, but  she is much better, calm and agreed for EGD and colonoscopy.  Patient is currently on CIWA protocol and IV Haldol ordered for intermittent agitation and confusion. CT abdomen shows diffuse hepatic steatosis.    Bipolar disorder Continue with Zyprexa.   AKI Probably secondary to dehydration and poor renal perfusion Resolved with IV fluids.   Right knee meniscal tear Repair on 09/30/2019. Recommend outpatient follow up with orthopedics for suture removal.    Hyponatremia, Resolved.   Hypokalemia Replaced.    Mild metabolic acidosis Resolved with bicarbonate infusion.   DVT prophylaxis: scd's Code Status: full code.  Family Communication: none at bedside. discussed with daughter over the phone.  Disposition:   Status is: Inpatient  Remains inpatient appropriate because:Ongoing diagnostic testing needed not appropriate for outpatient work up, Unsafe d/c plan and Inpatient level of care appropriate due to severity of illness   Dispo: The patient is from: Home  Anticipated d/c is to: Home  Anticipated d/c date is: 1-2  day  Patient currently is not medically stable to d/c.       Consultants:   Gastroenterology  Hematology  Orthopedics   Procedures: 07/15/2019 EGD/colonoscopy          Procedures:    No admission procedures for hospital encounter.   10/15/2019 EGD/colonoscopy >>   Antimicrobials:  Anti-infectives (From admission, onward)   Start     Dose/Rate Route Frequency Ordered Stop   10/11/19 1800  [MAR Hold]  cefTRIAXone (ROCEPHIN) 2 g in sodium chloride 0.9 % 100 mL IVPB     Discontinue     (MAR Hold since Wed 10/15/2019 at 0858.Hold Reason: Transfer to a Procedural area.)   2 g 200 mL/hr over 30 Minutes Intravenous Every 24 hours 10/11/19 1540     10/11/19 1800  [MAR Hold]  metroNIDAZOLE (FLAGYL) IVPB 500 mg     Discontinue     (MAR Hold since Wed 10/15/2019 at 0858.Hold Reason: Transfer to a Procedural area.)   500 mg 100 mL/hr over 60 Minutes Intravenous Every 8 hours 10/11/19 1540         Medication:  . [MAR Hold] sodium chloride   Intravenous Once  . [MAR Hold] folic acid  1 mg Oral Daily  . [MAR Hold] magnesium oxide  400 mg Oral Daily  . [  MAR Hold] multivitamin with minerals  1 tablet Oral Daily  . [MAR Hold] OLANZapine  5 mg Oral QHS  . [MAR Hold] pantoprazole  40 mg Oral Daily  . [MAR Hold] thiamine  100 mg Oral Daily   Or  . [MAR Hold] thiamine  100 mg Intravenous Daily    [MAR Hold] acetaminophen **OR** [MAR Hold] acetaminophen, [MAR Hold] bisacodyl, [MAR Hold] haloperidol lactate, [MAR Hold] hydrALAZINE, [MAR Hold] hydrOXYzine, [MAR Hold] ondansetron **OR** [MAR Hold] ondansetron (ZOFRAN) IV, [MAR Hold] polyethylene glycol, [MAR Hold] traMADol   Objective:   Vitals:   10/14/19 1256 10/14/19 2240 10/15/19 0452 10/15/19 0911  BP: 111/80 119/88 115/76 (!) 161/98  Pulse: 89 66 72 66  Resp: _0 Temp: 98.2 F (36.8 C) 98.4 F (36.9 C) 98.9 F (37.2 C) 98.4 F (36.9 C)  TempSrc: Oral Oral Oral Oral  SpO2: 99% 100% 99% 100%  Weight:   86.2 kg 86.2 kg  Height:    _1  (1.626 m)   No intake or output data in the 24 hours ending 10/15/19 1057 Filed Weights   10/13/19 0534 10/15/19 0452 10/15/19 0911  Weight: 85 kg 86.2 kg 86.2 kg      Examination:   Physical Exam  Constitution:  Alert, cooperative, no distress,  Appears calm and comfortable  Psychiatric: Normal and stable mood and affect, cognition intact,   HEENT: Normocephalic, PERRL, otherwise with in Normal limits  Chest:Chest symmetric Cardio vascular:  S1/S2, RRR, No murmure, No Rubs or Gallops  pulmonary: Clear to auscultation bilaterally, respirations unlabored, negative wheezes / crackles Abdomen: Soft, non-tender, non-distended, bowel sounds,no masses, no organomegaly Muscular skeletal: Limited exam - in bed, able to move all 4 extremities, Normal strength,  Neuro: CNII-XII intact. , normal motor and sensation, reflexes intact  Extremities: No pitting edema lower extremities, +2 pulses  Skin: Dry, warm to touch, negative for any Rashes, No open wounds Wounds: per nursing documentation, none visible    ------------------------------------------------------------------------------------------------------------------------------------------    LABs:  CBC Latest Ref Rng & Units 10/14/2019 10/13/2019 10/12/2019  WBC 4.0 - 10.5 K/uL 5.4 4.6 -  Hemoglobin 12.0 - 15.0 g/dL 8.8(L) 8.2(L) 9.4(L)  Hematocrit 36 - 46 % 26.2(L) 24.1(L) 28.0(L)  Platelets 150 - 400 K/uL 198 160 -   CMP Latest Ref Rng & Units 10/14/2019 10/12/2019 10/11/2019  Glucose 70 - 99 mg/dL 92 92 105(H)  BUN 6 - 20 mg/dL <5(L) <5(L) 9  Creatinine 0.44 - 1.00 mg/dL 0.55 0.51 0.88  Sodium 135 - 145 mmol/L 139 137 134(L)  Potassium 3.5 - 5.1 mmol/L 3.8 4.1 3.9  Chloride 98 - 111 mmol/L 104 106 109  CO2 22 - 32 mmol/L 25 23 14(L)  Calcium 8.9 - 10.3 mg/dL 8.3(L) 8.3(L) 7.3(L)  Total Protein 6.5 - 8.1 g/dL - 5.1(L) 5.3(L)  Total Bilirubin 0.3 - 1.2 mg/dL - 0.5 0.5  Alkaline Phos 38 - 126 U/L - 93 106  AST 15 - 41 U/L - 33 41  ALT 0 - 44 U/L - 10 12       Micro Results Recent Results (from the past 240 hour(s))  Culture, group A strep (throat)     Status: None   Collection Time: 10/05/19   5:52 PM   Specimen: Throat  Result Value Ref Range Status   Specimen Description THROAT  Final   Special Requests NONE  Final   Culture   Final    NO GROUP A STREP (S.PYOGENES) ISOLATED Performed at [facility], [place] [address], [place], [place] [address]    Report Status 10/08/2019 FINAL  Final  SARS Coronavirus 2 by RT PCR (hospital order, performed in [facility] hospital lab) Nasopharyngeal Nasopharyngeal Swab     Status: None   Collection Time: 10/10/19  8:33 AM   Specimen: Nasopharyngeal Swab  Result Value Ref Range Status   SARS Coronavirus 2 NEGATIVE NEGATIVE Final    Comment: (NOTE) SARS-CoV-2 target nucleic acids are NOT DETECTED.  The SARS-CoV-2 RNA is generally detectable in upper and lower respiratory specimens during the acute phase of infection. The lowest concentration of SARS-CoV-2 viral copies this assay can detect is 250 copies / mL. A negative result does not preclude SARS-CoV-2 infection and should not be used as the sole basis for treatment or other patient management decisions.  A negative result may occur with improper specimen collection / handling, submission of specimen other than nasopharyngeal swab, presence of viral mutation(s) within the areas targeted by this assay, and inadequate number of viral copies (<250 copies / mL). A negative result must be combined with clinical observations, patient history, and epidemiological information.  Fact Sheet for Patients:   StrictlyIdeas.no  Fact Sheet for Healthcare Providers: BankingDealers.co.za  This test is not yet approved or  cleared by the [place] FDA and has been authorized for detection and/or diagnosis of SARS-CoV-2 by FDA under an Emergency Use Authorization (EUA).  This EUA will remain in effect (meaning this test can be used) for the duration of the COVID-19 declaration under Section 564(b)(1) of the Act, 21 U.S.C. section  360bbb-3(b)(1), unless the authorization is terminated or revoked sooner.  Performed at [facility], [place] [address] [place], [place] [address]   Culture, blood (routine x 2)     Status: None   Collection Time: 10/10/19  1:57 PM   Specimen: BLOOD  Result Value Ref Range Status   Specimen Description   Final    BLOOD RIGHT ANTECUBITAL Performed at [place] [address], [place], [place] [address]    Special Requests   Final    BOTTLES DRAWN AEROBIC AND ANAEROBIC Blood Culture adequate volume Performed at [place] [address] [place], [place] [address]    Culture   Final    NO GROWTH 5 DAYS Performed at [facility], [place] [address], [place], [place] [address]    Report Status 10/15/2019 FINAL  Final  Culture, blood (routine x 2)     Status: None   Collection Time: 10/10/19  1:57 PM   Specimen: BLOOD  Result Value Ref Range Status   Specimen Description   Final    BLOOD LEFT ANTECUBITAL Performed at [place] [address], [place], [place] [address]    Special Requests   Final    BOTTLES DRAWN AEROBIC AND ANAEROBIC Blood Culture adequate volume Performed at [facility] [address], [place], [place] [address]    Culture   Final    NO GROWTH 5 DAYS Performed at [facility], [place] [address] [place],  [address]    Report Status 10/15/2019 FINAL  Final  MRSA PCR Screening     Status: None   Collection Time: 10/10/19 10:45 PM   Specimen: Nasopharyngeal  Result Value Ref Range Status   MRSA by PCR NEGATIVE NEGATIVE Final    Comment:        The GeneXpert MRSA Assay (FDA approved for  NASAL specimens only), is one component of a comprehensive MRSA colonization surveillance program. It is not intended to diagnose MRSA infection nor to guide or monitor treatment for MRSA infections. Performed at [facility], [place]  [address] [place], [place] [address]   Gastrointestinal Panel by PCR , Stool     Status: None   Collection Time: 10/11/19  4:05 PM   Specimen: Rectum; Stool  Result Value Ref Range Status   Campylobacter species NOT DETECTED NOT DETECTED Final   Plesimonas shigelloides NOT DETECTED NOT DETECTED Final   Salmonella species NOT DETECTED NOT DETECTED Final   Yersinia enterocolitica NOT DETECTED NOT DETECTED Final   Vibrio species NOT DETECTED NOT DETECTED Final   Vibrio cholerae NOT DETECTED NOT DETECTED Final   Enteroaggregative E coli (EAEC) NOT DETECTED NOT DETECTED Final   Enteropathogenic E coli (EPEC) NOT DETECTED NOT DETECTED Final   Enterotoxigenic E coli (ETEC) NOT DETECTED NOT DETECTED Final   Shiga like toxin producing E coli (STEC) NOT DETECTED NOT DETECTED Final   Shigella/Enteroinvasive E coli (EIEC) NOT DETECTED NOT DETECTED Final   Cryptosporidium NOT DETECTED NOT DETECTED Final   Cyclospora cayetanensis NOT DETECTED NOT DETECTED Final   Entamoeba histolytica NOT DETECTED NOT DETECTED Final   Giardia lamblia NOT DETECTED NOT DETECTED Final   Adenovirus F40/41 NOT DETECTED NOT DETECTED Final   Astrovirus NOT DETECTED NOT DETECTED Final   Norovirus GI/GII NOT DETECTED NOT DETECTED Final   Rotavirus A NOT DETECTED NOT DETECTED Final   Sapovirus (I, II, IV, and V) NOT DETECTED NOT DETECTED Final    Comment: Performed at [facility], [place] [place], [place] [address]  C Difficile Quick Screen w PCR reflex     Status: None   Collection Time: 10/11/19  4:05 PM   Specimen: Rectum; Stool  Result Value Ref Range Status   C Diff antigen NEGATIVE NEGATIVE Final   C Diff toxin NEGATIVE NEGATIVE Final   C Diff interpretation No C. difficile detected.  Final    Comment: Performed at [facility], [place] [address], [place], [place] [address]    Radiology Reports DG Knee 1-2 Views Right  Result Date: 10/10/2019 CLINICAL DATA:  Pain.  Recent knee  arthroscopy. EXAM: RIGHT KNEE - 1-2 VIEW COMPARISON:  January 12, 2019 FINDINGS: No acute fracture or dislocation. Mild degenerative changes of the medial and patellofemoral compartments. Enthesiophyte at the quadriceps tendon insertion on the patella. There is a new well corticated ossific density projecting over the posterior joint space in comparison to prior. This may reflect a loose body versus new osteophyte. No area of erosion or osseous destruction. No unexpected radiopaque foreign body. Soft tissues are unremarkable. IMPRESSION: 1. New well corticated ossific density projecting over the posterior joint space in comparison to prior. This may reflect a loose body versus new osteophyte. 2. Mild medial and patellofemoral compartment osteoarthritis. Electronically Signed   By: Valentino Saxon MD   On: 10/10/2019 09:17   CT HEAD WO CONTRAST  Result Date: 10/10/2019 CLINICAL DATA:  Mental status change EXAM: CT HEAD WITHOUT CONTRAST TECHNIQUE: Contiguous axial images were obtained from the base of the skull through the vertex without intravenous contrast. COMPARISON:  None. FINDINGS: Brain: There is no acute intracranial hemorrhage, mass effect, or edema. Gray-white differentiation is preserved. There is no extra-axial fluid collection. Ventricles and sulci are within normal limits in size and configuration. Patchy hypoattenuation in the supratentorial white matter is nonspecific but may reflect  mild early microvascular ischemic changes or gliosis/demyelination of other etiologies. Vascular: No hyperdense vessel or unexpected calcification. Skull: Calvarium is unremarkable. Sinuses/Orbits: No acute finding. Other: None. IMPRESSION: No acute intracranial hemorrhage, mass effect, or evidence of acute infarction. Electronically Signed   By: Macy Mis M.D.   On: 10/10/2019 15:04   CT ANGIO CHEST PE W OR WO CONTRAST  Result Date: 10/11/2019 CLINICAL DATA:  44 year old presenting with multiple recent  syncopal episodes, three-month history of nausea and vomiting, 2 week history of diarrhea, and acute severe generalized abdominal pain. Elevated D-dimer. EXAM: CT ANGIOGRAPHY CHEST CT ABDOMEN AND PELVIS WITH CONTRAST TECHNIQUE: Multidetector CT imaging of the chest was performed using the standard protocol during bolus administration of intravenous contrast. Multiplanar CT image reconstructions and MIPs were obtained to evaluate the vascular anatomy. Multidetector CT imaging of the abdomen and pelvis was performed using the standard protocol during bolus administration of intravenous contrast. CONTRAST:  113m OMNIPAQUE IOHEXOL 350 MG/ML IV. COMPARISON:  CT abdomen and pelvis 03/20/2018 and earlier. No prior chest CT. FINDINGS: CTA CHEST FINDINGS Cardiovascular: Contrast opacification of the pulmonary arteries is very good. Respiratory motion blurred images in the mid and upper lungs. Overall, the study is of good diagnostic quality. No filling defects within either main pulmonary artery or their segmental branches in either lung to suggest pulmonary embolism. Normal heart size. No pericardial effusion. No visible coronary atherosclerosis. No visible atherosclerosis involving the thoracic or proximal abdominal aorta or their visible branches. No evidence of aneurysm. Mediastinum/Nodes: Numerous normal sized lymph nodes throughout the mediastinum and in both hila. No significant lymphadenopathy. Normal appearing esophagus. Visualized thyroid gland normal in appearance. Lungs/Pleura: 3 mm nodule in the RIGHT MIDDLE LOBE (8/80). No pulmonary parenchymal nodules or masses elsewhere in either lung. No confluent or ground-glass airspace consolidation. No evidence of interstitial lung disease. Central airways patent without significant bronchial wall thickening. No pleural effusions. Musculoskeletal: Mild degenerative disc disease and spondylosis involving the UPPER and lower thoracic spine. No acute findings. Review of  the MIP images confirms the above findings. CT ABDOMEN and PELVIS FINDINGS Hepatobiliary: Borderline to mild hepatomegaly. Diffuse hepatic steatosis without focal hepatic parenchymal abnormality. Gallbladder normal in appearance without calcified gallstones. No biliary ductal dilation. Pancreas: Normal in appearance without evidence of mass, ductal dilation, or inflammation. Spleen: Normal in size and appearance. Adrenals/Urinary Tract: Normal appearing adrenal glands. Kidneys normal in size and appearance without focal parenchymal abnormality. No hydronephrosis. No evidence of urinary tract calculi. Decompressed urinary bladder with mild circumferential wall thickening which is somewhat out of proportion to that expected for the decompressed state. Stomach/Bowel: Stomach normal in appearance for the degree of distention. Normal-appearing small bowel. Tortuous and redundant sigmoid colon expected colonic stool burden. Wall thickening involving the cecum, ascending colon and transverse colon. Normal appendix in the RIGHT mid and upper pelvis. Vascular/Lymphatic: No visible aortoiliofemoral atherosclerosis. Widely patent visceral arteries. Normal-appearing portal venous and systemic venous systems. No pathologic lymphadenopathy. Reproductive: Normal-appearing uterus and ovaries without evidence of adnexal mass. Other: Very small supraumbilical midline ANTERIOR abdominal wall hernia containing fat and fluid. Musculoskeletal: Regional skeleton unremarkable without acute or significant osseous abnormality. IMPRESSION: 1. No evidence of pulmonary embolism. 2. No acute cardiopulmonary disease. 3. 3 mm nodule involving the RIGHT MIDDLE LOBE. No follow-up needed if patient is low-risk. Non-contrast chest CT can be considered in 12 months if patient is high-risk. This recommendation follows the consensus statement: Guidelines for Management of Incidental Pulmonary Nodules Detected on CT Images: From the Fleischner  Society  2017; Radiology 2017; 284:228-243. 4. Wall thickening involving the cecum, ascending colon and transverse colon, possibly indicating colitis. 5. Borderline to mild hepatomegaly. Diffuse hepatic steatosis without focal hepatic parenchymal abnormality. 6. Very small supraumbilical midline ANTERIOR abdominal wall hernia containing fat and fluid. 7. Mild circumferential wall thickening involving the urinary bladder which is somewhat out of proportion to that expected for the decompressed state, unchanged since the CT in January, 2020, query cystitis (interstitial cystitis?). Electronically Signed   By: Evangeline Dakin M.D.   On: 10/11/2019 14:22   CT ABDOMEN PELVIS W CONTRAST  Result Date: 10/11/2019 CLINICAL DATA:  44 year old presenting with multiple recent syncopal episodes, three-month history of nausea and vomiting, 2 week history of diarrhea, and acute severe generalized abdominal pain. Elevated D-dimer. EXAM: CT ANGIOGRAPHY CHEST CT ABDOMEN AND PELVIS WITH CONTRAST TECHNIQUE: Multidetector CT imaging of the chest was performed using the standard protocol during bolus administration of intravenous contrast. Multiplanar CT image reconstructions and MIPs were obtained to evaluate the vascular anatomy. Multidetector CT imaging of the abdomen and pelvis was performed using the standard protocol during bolus administration of intravenous contrast. CONTRAST:  138m OMNIPAQUE IOHEXOL 350 MG/ML IV. COMPARISON:  CT abdomen and pelvis 03/20/2018 and earlier. No prior chest CT. FINDINGS: CTA CHEST FINDINGS Cardiovascular: Contrast opacification of the pulmonary arteries is very good. Respiratory motion blurred images in the mid and upper lungs. Overall, the study is of good diagnostic quality. No filling defects within either main pulmonary artery or their segmental branches in either lung to suggest pulmonary embolism. Normal heart size. No pericardial effusion. No visible coronary atherosclerosis. No visible  atherosclerosis involving the thoracic or proximal abdominal aorta or their visible branches. No evidence of aneurysm. Mediastinum/Nodes: Numerous normal sized lymph nodes throughout the mediastinum and in both hila. No significant lymphadenopathy. Normal appearing esophagus. Visualized thyroid gland normal in appearance. Lungs/Pleura: 3 mm nodule in the RIGHT MIDDLE LOBE (8/80). No pulmonary parenchymal nodules or masses elsewhere in either lung. No confluent or ground-glass airspace consolidation. No evidence of interstitial lung disease. Central airways patent without significant bronchial wall thickening. No pleural effusions. Musculoskeletal: Mild degenerative disc disease and spondylosis involving the UPPER and lower thoracic spine. No acute findings. Review of the MIP images confirms the above findings. CT ABDOMEN and PELVIS FINDINGS Hepatobiliary: Borderline to mild hepatomegaly. Diffuse hepatic steatosis without focal hepatic parenchymal abnormality. Gallbladder normal in appearance without calcified gallstones. No biliary ductal dilation. Pancreas: Normal in appearance without evidence of mass, ductal dilation, or inflammation. Spleen: Normal in size and appearance. Adrenals/Urinary Tract: Normal appearing adrenal glands. Kidneys normal in size and appearance without focal parenchymal abnormality. No hydronephrosis. No evidence of urinary tract calculi. Decompressed urinary bladder with mild circumferential wall thickening which is somewhat out of proportion to that expected for the decompressed state. Stomach/Bowel: Stomach normal in appearance for the degree of distention. Normal-appearing small bowel. Tortuous and redundant sigmoid colon expected colonic stool burden. Wall thickening involving the cecum, ascending colon and transverse colon. Normal appendix in the RIGHT mid and upper pelvis. Vascular/Lymphatic: No visible aortoiliofemoral atherosclerosis. Widely patent visceral arteries.  Normal-appearing portal venous and systemic venous systems. No pathologic lymphadenopathy. Reproductive: Normal-appearing uterus and ovaries without evidence of adnexal mass. Other: Very small supraumbilical midline ANTERIOR abdominal wall hernia containing fat and fluid. Musculoskeletal: Regional skeleton unremarkable without acute or significant osseous abnormality. IMPRESSION: 1. No evidence of pulmonary embolism. 2. No acute cardiopulmonary disease. 3. 3 mm nodule involving the RIGHT MIDDLE LOBE. No follow-up needed  if patient is low-risk. Non-contrast chest CT can be considered in 12 months if patient is high-risk. This recommendation follows the consensus statement: Guidelines for Management of Incidental Pulmonary Nodules Detected on CT Images: From the Fleischner Society 2017; Radiology 2017; 284:228-243. 4. Wall thickening involving the cecum, ascending colon and transverse colon, possibly indicating colitis. 5. Borderline to mild hepatomegaly. Diffuse hepatic steatosis without focal hepatic parenchymal abnormality. 6. Very small supraumbilical midline ANTERIOR abdominal wall hernia containing fat and fluid. 7. Mild circumferential wall thickening involving the urinary bladder which is somewhat out of proportion to that expected for the decompressed state, unchanged since the CT in January, 2020, query cystitis (interstitial cystitis?). Electronically Signed   By: Evangeline Dakin M.D.   On: 10/11/2019 14:22   DG CHEST PORT 1 VIEW  Result Date: 10/10/2019 CLINICAL DATA:  Hypotension. EXAM: PORTABLE CHEST 1 VIEW COMPARISON:  None. FINDINGS: The heart size and mediastinal contours are within normal limits. Both lungs are clear. The visualized skeletal structures are unremarkable. IMPRESSION: No active disease. Electronically Signed   By: Marijo Conception M.D.   On: 10/10/2019 16:42   DG Abd 2 Views  Result Date: 10/10/2019 CLINICAL DATA:  Abdominal tenderness with nausea and vomiting EXAM: ABDOMEN - 2  VIEW COMPARISON:  Bowel FINDINGS: Bowel gas pattern is unremarkable. There are no significantly dilated loops. No significant stool burden. No air-fluid levels or free air. IMPRESSION: Unremarkable bowel gas pattern. Electronically Signed   By: Macy Mis M.D.   On: 10/10/2019 14:12   ECHOCARDIOGRAM COMPLETE  Result Date: 10/10/2019    ECHOCARDIOGRAM REPORT   Patient Name:   Kristin [name] Date of Exam: 10/10/2019 Medical Rec #:  [id number]      Height:       64.5 in Accession #:    [id number]     Weight:       169.5 lb Date of Birth:  1975-07-17     BSA:          1.833 m Patient Age:    18 years       BP:           128/86 mmHg Patient Gender: F              HR:           90 bpm. Exam Location:  Inpatient Procedure: 2D Echo, Cardiac Doppler and Color Doppler Indications:    Syncope 780.2 / R55  History:        Patient has no prior history of Echocardiogram examinations.                 Signs/Symptoms:Syncope; Risk Factors:Hypertension and Current                 Smoker.  Sonographer:    Vickie Epley RDCS Referring Phys: [id number] [facility] LATIF [place]  1. Left ventricular ejection fraction, by estimation, is 55 to 60%. The left ventricle has normal function. The left ventricle has no regional wall motion abnormalities. Left ventricular diastolic parameters were normal.  2. Right ventricular systolic function is normal. The right ventricular size is normal.  3. The mitral valve is normal in structure. No evidence of mitral valve regurgitation. No evidence of mitral stenosis.  4. The aortic valve is normal in structure. Aortic valve regurgitation is not visualized. No aortic stenosis is present.  5. The inferior vena cava is normal in size with greater than 50% respiratory variability, suggesting right atrial pressure of 3  mmHg. FINDINGS  Left Ventricle: Left ventricular ejection fraction, by estimation, is 55 to 60%. The left ventricle has normal function. The left ventricle has no regional wall motion  abnormalities. The left ventricular internal cavity size was normal in size. There is  no left ventricular hypertrophy. Left ventricular diastolic parameters were normal. Normal left ventricular filling pressure. Right Ventricle: The right ventricular size is normal. No increase in right ventricular wall thickness. Right ventricular systolic function is normal. Left Atrium: Left atrial size was not well visualized. Right Atrium: Right atrial size was not well visualized. Pericardium: There is no evidence of pericardial effusion. Mitral Valve: The mitral valve is normal in structure. Normal mobility of the mitral valve leaflets. No evidence of mitral valve regurgitation. No evidence of mitral valve stenosis. Tricuspid Valve: The tricuspid valve is normal in structure. Tricuspid valve regurgitation is trivial. No evidence of tricuspid stenosis. Aortic Valve: The aortic valve is normal in structure. Aortic valve regurgitation is not visualized. No aortic stenosis is present. Pulmonic Valve: The pulmonic valve was normal in structure. Pulmonic valve regurgitation is not visualized. No evidence of pulmonic stenosis. Aorta: The aortic root is normal in size and structure. Venous: The inferior vena cava is normal in size with greater than 50% respiratory variability, suggesting right atrial pressure of 3 mmHg. IAS/Shunts: No atrial level shunt detected by color flow Doppler.  LEFT VENTRICLE PLAX 2D LVIDd:         5.24 cm      Diastology LVIDs:         3.80 cm      LV e' lateral:   13.30 cm/s LV PW:         0.68 cm      LV E/e' lateral: 3.8 LV IVS:        0.70 cm      LV e' medial:    9.03 cm/s LVOT diam:     2.10 cm      LV E/e' medial:  5.6 LV SV:         62 LV SV Index:   34 LVOT Area:     3.46 cm  LV Volumes (MOD) LV vol d, MOD A2C: 107.0 ml LV vol d, MOD A4C: 128.0 ml LV vol s, MOD A2C: 57.4 ml LV vol s, MOD A4C: 58.0 ml LV SV MOD A2C:     49.6 ml LV SV MOD A4C:     128.0 ml LV SV MOD BP:      60.5 ml RIGHT VENTRICLE  RV S prime:     11.60 cm/s TAPSE (M-mode): 1.6 cm LEFT ATRIUM             Index       RIGHT ATRIUM          Index LA diam:        2.80 cm 1.53 cm/m  RA Area:     7.33 cm LA Vol (A2C):   16.9 ml 9.22 ml/m  RA Volume:   11.70 ml 6.38 ml/m LA Vol (A4C):   21.7 ml 11.84 ml/m LA Biplane Vol: 21.2 ml 11.56 ml/m  AORTIC VALVE LVOT Vmax:   106.00 cm/s LVOT Vmean:  72.300 cm/s LVOT VTI:    0.179 m  AORTA Ao Root diam: 3.30 cm MITRAL VALVE MV Area (PHT): 4.06 cm    SHUNTS MV Decel Time: 187 msec    Systemic VTI:  0.18 m MV E velocity: 50.90 cm/s  Systemic Diam: 2.10 cm MV A  velocity: 51.40 cm/s MV E/A ratio:  0.99 Fransico Him MD Electronically signed by Fransico Him MD Signature Date/Time: 10/10/2019/12:52:28 PM    Final    US Abdomen Limited RUQ  Result Date: 10/10/2019 CLINICAL DATA:  Abdominal pain EXAM: ULTRASOUND ABDOMEN LIMITED RIGHT UPPER QUADRANT COMPARISON:  03/20/2018 FINDINGS: Gallbladder: Under distended gallbladder without wall thickening. There is dependent sludge. No shadowing gallstone or focal tenderness. Common bile duct: Diameter: 6 mm Liver: Mildly echogenic. No focal hepatic lesion. Portal vein is patent on color Doppler imaging with normal direction of blood flow towards the liver. IMPRESSION: 1. Gallbladder sludge.  No evidence of cholecystitis. 2. Hepatic steatosis. Electronically Signed   By: Monte Fantasia M.D.   On: 10/10/2019 14:44    SIGNED: Deatra James, MD, FACP, FHM. [facility],  Pager (please use [email or web address] to page/text)  If 7PM-7AM, please contact night-coverage [email or web address] 10/15/2019, 10:57 AM

## 2019-10-16 ENCOUNTER — Other Ambulatory Visit: Payer: Self-pay

## 2019-10-16 LAB — SURGICAL PATHOLOGY

## 2019-10-28 ENCOUNTER — Other Ambulatory Visit: Payer: Self-pay | Admitting: Family Medicine

## 2019-10-28 DIAGNOSIS — M25561 Pain in right knee: Secondary | ICD-10-CM

## 2019-10-28 DIAGNOSIS — G8929 Other chronic pain: Secondary | ICD-10-CM

## 2019-10-28 DIAGNOSIS — M5441 Lumbago with sciatica, right side: Secondary | ICD-10-CM

## 2019-10-28 MED ORDER — GABAPENTIN 300 MG PO CAPS: 300.0000 mg | ORAL_CAPSULE | Freq: Three times a day (TID) | ORAL | 2 refills | Status: DC

## 2019-11-04 ENCOUNTER — Telehealth: Payer: Self-pay | Admitting: Hematology

## 2019-11-04 NOTE — Telephone Encounter (Signed)
Kristin [name] cld to reschedule her hospital f/u appt to 9/9 at 240pm w/labs at 215pm.

## 2019-11-06 ENCOUNTER — Inpatient Hospital Stay: Payer: 59 | Admitting: Hematology

## 2019-11-06 ENCOUNTER — Inpatient Hospital Stay: Payer: 59

## 2019-11-10 ENCOUNTER — Telehealth: Payer: Self-pay

## 2019-11-10 NOTE — Telephone Encounter (Signed)
Patient calls nurse line requesting refill on lisinopril. Upon chart review, medication was discontinued during last hospitalization. Patient has OV scheduled for 9/14 for follow up.   Please advise if patient is to continue taking lisinopril. If so, please send in refill to [facility] on [address].   Veronda Prude, RN

## 2019-11-11 MED ORDER — LISINOPRIL-HYDROCHLOROTHIAZIDE 10-12.5 MG PO TABS
1.0000 | ORAL_TABLET | Freq: Every day | ORAL | 3 refills | Status: DC
Start: 2019-11-11 — End: 2020-09-23

## 2019-11-11 NOTE — Telephone Encounter (Signed)
Discussed with patient the use of her combination lisinopril hydrochlorothiazide.  She has been taking it since discharge and says that when she takes her blood pressures they are within normal limits.  Reports that she was not instructed to discontinue taking these medications and needs a refill.  Patient was having low blood pressures as well as had an AKI while in the hospital which may be the reason for them discontinuing the medication but given she is continue to take it and her blood pressures have remained solid I have refilled this medication.

## 2019-11-13 ENCOUNTER — Other Ambulatory Visit: Payer: Self-pay | Admitting: Family Medicine

## 2019-11-13 DIAGNOSIS — K209 Esophagitis, unspecified without bleeding: Secondary | ICD-10-CM

## 2019-11-19 ENCOUNTER — Other Ambulatory Visit: Payer: Self-pay

## 2019-11-19 DIAGNOSIS — D509 Iron deficiency anemia, unspecified: Secondary | ICD-10-CM

## 2019-11-19 NOTE — Progress Notes (Signed)
[place]   [phone] [phone]   Clinic Follow up Note   Patient Care Team: Gifford Shave, MD as PCP - General (Family Medicine)  Date of Service:  11/20/2019  CHIEF COMPLAINT: F/u of anemia    CURRENT THERAPY:  Folic acid $RemoveBe'1mg'[id number]$  daily   INTERVAL HISTORY:  Kristin [name] is here for a follow up of anemia. He presents to the clinic with her mother.  I met her when she was admitted for GI symptoms and severe anemia on October 10, 2019.  She required blood transfusion.  She was seen by GI and underwent EGD and colonoscopy.  Feels beter after discharge, still has mild diarrhea    Started having nausea, vomiting and diarreha again 2-3 days ago, 3-4 BM daily , loose, which is yellow or melena.  Burning sensation in epigastric area,.  No fever or chills.  No fever or chills  ROS otherwise negative    MEDICAL HISTORY:  Past Medical History:  Diagnosis Date   Acid reflux    ACL tear 2011   not sure which side   Acute lateral meniscus tear of right knee    Anxiety    Arthritis    Back pain    slipped disc lower back lumbar   Depression    Eczema    Hypertension    Migraine headache    Neuromuscular disorder (HCC)    leg cramps    SURGICAL HISTORY: Past Surgical History:  Procedure Laterality Date   BIOPSY  09/19/2017   Procedure: BIOPSY;  Surgeon: Otis Brace, MD;  Location: [facility];  Service: Gastroenterology;;   BIOPSY  10/15/2019   Procedure: BIOPSY;  Surgeon: Wilford Corner, MD;  Location: [facility];  Service: Endoscopy;;   COLONOSCOPY N/A 10/15/2019   Procedure: COLONOSCOPY;  Surgeon: Wilford Corner, MD;  Location: [facility];  Service: Endoscopy;  Laterality: N/A;   DILITATION & CURRETTAGE/HYSTROSCOPY WITH HYDROTHERMAL ABLATION N/A 04/04/2017   Procedure: DILATATION & CURETTAGE/HYSTEROSCOPY WITH HYDROTHERMAL ABLATION;  Surgeon: Donnamae Jude, MD;  Location: [place];  Service:  Gynecology;  Laterality: N/A;   ESOPHAGOGASTRODUODENOSCOPY N/A 10/15/2019   Procedure: ESOPHAGOGASTRODUODENOSCOPY (EGD);  Surgeon: Wilford Corner, MD;  Location: [facility];  Service: Endoscopy;  Laterality: N/A;   ESOPHAGOGASTRODUODENOSCOPY (EGD) WITH PROPOFOL N/A 09/19/2017   Procedure: ESOPHAGOGASTRODUODENOSCOPY (EGD) WITH PROPOFOL;  Surgeon: Otis Brace, MD;  Location: [place];  Service: Gastroenterology;  Laterality: N/A;   KNEE ARTHROSCOPY WITH LATERAL MENISECTOMY Right 09/30/2019   Procedure: KNEE ARTHROSCOPY WITH LATERAL MENISECTOMY;  Surgeon: Renette Butters, MD;  Location: [facility];  Service: Orthopedics;  Laterality: Right;   TUBAL LIGATION  yrs ago    I have reviewed the social history and family history with the patient and they are unchanged from previous note.  ALLERGIES:  is allergic to sumatriptan, other, and eggs or egg-derived products.  MEDICATIONS:  Current Outpatient Medications  Medication Sig Dispense Refill   famotidine (PEPCID) 20 MG tablet TAKE 1 TABLET(20 MG) BY MOUTH TWICE DAILY 30 tablet 0   acetaminophen (TYLENOL) 500 MG tablet Take 1 tablet (500 mg total) by mouth every 8 (eight) hours as needed for mild pain. 30 tablet 1   folic acid (FOLVITE) 1 MG tablet Take 1 tablet (1 mg total) by mouth daily. 30 tablet 5   gabapentin (NEURONTIN) 300 MG capsule Take 1 capsule (300 mg total) by mouth 3 (three) times daily. 90 capsule 2   hydrOXYzine (VISTARIL) 25 MG capsule  Take 1 capsule (25 mg total) by mouth daily as needed for anxiety. 20 capsule 1   lisinopril-hydrochlorothiazide (ZESTORETIC) 10-12.5 MG tablet Take 1 tablet by mouth daily. 90 tablet 3   magnesium oxide (MAG-OX) 400 MG tablet Take 1 tablet (400 mg total) by mouth daily. 10 tablet 0   Multiple Vitamin (MULTIVITAMIN WITH MINERALS) TABS tablet Take 1 tablet by mouth daily.     OLANZapine (ZYPREXA) 5 MG tablet Take 1 tablet (5 mg total) by mouth at bedtime. 90  tablet 1   ondansetron (ZOFRAN) 4 MG tablet Take 1 tablet (4 mg total) by mouth every 8 (eight) hours as needed for nausea or vomiting. 20 tablet 0   pantoprazole (PROTONIX) 40 MG tablet Take 1 tablet (40 mg total) by mouth daily. 30 tablet 1   polyethylene glycol (MIRALAX / GLYCOLAX) 17 g packet Take 17 g by mouth daily as needed for mild constipation. 14 each 0   thiamine 100 MG tablet Take 1 tablet (100 mg total) by mouth daily. 30 tablet 0   tiZANidine (ZANAFLEX) 4 MG tablet Take 1-2 tablets (4-8 mg total) by mouth every 6 (six) hours as needed for muscle spasms. 21 tablet 0   No current facility-administered medications for this visit.    PHYSICAL EXAMINATION: ECOG PERFORMANCE STATUS: 1 - Symptomatic but completely ambulatory  Vitals:   11/20/19 1424  BP: (!) 153/99  Pulse: 86  Resp: 18  Temp: 98.4 F (36.9 C)  SpO2: 100%   Filed Weights   11/20/19 1424  Weight: 176 lb (79.8 kg)    GENERAL:alert, no distress and comfortable SKIN: skin color, texture, turgor are normal, no rashes or significant lesions EYES: normal, Conjunctiva are pink and non-injected, sclera clear NECK: supple, thyroid normal size, non-tender, without nodularity LYMPH:  no palpable lymphadenopathy in the cervical, axillary  LUNGS: clear to auscultation and percussion with normal breathing effort HEART: regular rate & rhythm and no murmurs and no lower extremity edema ABDOMEN:abdomen soft, non-tender and normal bowel sounds Musculoskeletal:no cyanosis of digits and no clubbing  NEURO: alert & oriented x 3 with fluent speech, no focal motor/sensory deficits  LABORATORY DATA:  I have reviewed the data as listed CBC Latest Ref Rng & Units 11/20/2019 10/14/2019 10/13/2019  WBC 4.0 - 10.5 K/uL 5.2 5.4 4.6  Hemoglobin 12.0 - 15.0 g/dL 9.9(L) 8.8(L) 8.2(L)  Hematocrit 36 - 46 % 30.3(L) 26.2(L) 24.1(L)  Platelets 150 - 400 K/uL 178 198 160     CMP Latest Ref Rng & Units 10/14/2019 10/12/2019 10/11/2019    Glucose 70 - 99 mg/dL 92 92 105(H)  BUN 6 - 20 mg/dL <5(L) <5(L) 9  Creatinine 0.44 - 1.00 mg/dL 0.55 0.51 0.88  Sodium 135 - 145 mmol/L 139 137 134(L)  Potassium 3.5 - 5.1 mmol/L 3.8 4.1 3.9  Chloride 98 - 111 mmol/L 104 106 109  CO2 22 - 32 mmol/L 25 23 14(L)  Calcium 8.9 - 10.3 mg/dL 8.3(L) 8.3(L) 7.3(L)  Total Protein 6.5 - 8.1 g/dL - 5.1(L) 5.3(L)  Total Bilirubin 0.3 - 1.2 mg/dL - 0.5 0.5  Alkaline Phos 38 - 126 U/L - 93 106  AST 15 - 41 U/L - 33 41  ALT 0 - 44 U/L - 10 12      RADIOGRAPHIC STUDIES: I have personally reviewed the radiological images as listed and agreed with the findings in the report. No results found.   ASSESSMENT & PLAN:  Kristin [name] is a 44 y.o. female with  1.Severe anemia, normocytic and hypoproductive -Her anemia work-up showed low reticulocyte count, normal MCV, elevated ferritin and B63, low folic acid level, no lab evidence of hemolysis -Her 09/2019 CT scan showed no evidence of internal bleeding, it does show evidence of colitis, mild hepatomegaly with hepatic steatosis.  -Her anemia is likely related to folate deficiency, anemia of chronic disease and alcohol  -Reviewed today, edema improved, hemoglobin 9.9 -My suspicion for MDS or other malignant hematological disorder is low, I do not think she needs bone marrow biopsy  -continue lab monitoring -I recommend her to take prenatal vitamin once daily   2. Folate deficiency due to alcohol drinking, Alcohol Cessation  -He is a folate deficiency is related to her alcohol abuse -Continue folic acid once daily  3. Nausea, GERD, Diarrhea, colitis -Continue medications.  -this has been recurrent  -she has EGD and colonoscopy on 10/15/2019 which were markable except a mild gastritis -f/u with GI    4.Anxiety, depression and bipolar -Continue medications.    PLAN:  -Reviewed, anemia improved, will continue monitoring -New folic acid 1 mg daily -I suggested her to start prenatal  multivitamin once daily -f/u in 6 months with lab    No problem-specific Assessment & Plan notes found for this encounter.   No orders of the defined types were placed in this encounter.  All questions were answered. The patient knows to call the clinic with any problems, questions or concerns. No barriers to learning was detected. The total time spent in the appointment was 25 minutes.     Truitt Merle, MD 11/20/2019   I, Joslyn Devon, am acting as scribe for Truitt Merle, MD.   I have reviewed the above documentation for accuracy and completeness, and I agree with the above.

## 2019-11-20 ENCOUNTER — Inpatient Hospital Stay: Payer: 59

## 2019-11-20 ENCOUNTER — Other Ambulatory Visit: Payer: Self-pay

## 2019-11-20 ENCOUNTER — Inpatient Hospital Stay: Payer: 59 | Attending: Hematology | Admitting: Hematology

## 2019-11-20 ENCOUNTER — Encounter: Payer: Self-pay | Admitting: Hematology

## 2019-11-20 ENCOUNTER — Telehealth: Payer: Self-pay | Admitting: Hematology

## 2019-11-20 DIAGNOSIS — R197 Diarrhea, unspecified: Secondary | ICD-10-CM | POA: Insufficient documentation

## 2019-11-20 DIAGNOSIS — E538 Deficiency of other specified B group vitamins: Secondary | ICD-10-CM | POA: Diagnosis not present

## 2019-11-20 DIAGNOSIS — F319 Bipolar disorder, unspecified: Secondary | ICD-10-CM | POA: Diagnosis not present

## 2019-11-20 DIAGNOSIS — M129 Arthropathy, unspecified: Secondary | ICD-10-CM | POA: Insufficient documentation

## 2019-11-20 DIAGNOSIS — R110 Nausea: Secondary | ICD-10-CM | POA: Insufficient documentation

## 2019-11-20 DIAGNOSIS — Z79899 Other long term (current) drug therapy: Secondary | ICD-10-CM | POA: Diagnosis not present

## 2019-11-20 DIAGNOSIS — I10 Essential (primary) hypertension: Secondary | ICD-10-CM | POA: Diagnosis not present

## 2019-11-20 DIAGNOSIS — F1010 Alcohol abuse, uncomplicated: Secondary | ICD-10-CM | POA: Diagnosis not present

## 2019-11-20 DIAGNOSIS — K219 Gastro-esophageal reflux disease without esophagitis: Secondary | ICD-10-CM | POA: Insufficient documentation

## 2019-11-20 DIAGNOSIS — R109 Unspecified abdominal pain: Secondary | ICD-10-CM | POA: Insufficient documentation

## 2019-11-20 DIAGNOSIS — D649 Anemia, unspecified: Secondary | ICD-10-CM | POA: Diagnosis not present

## 2019-11-20 DIAGNOSIS — D509 Iron deficiency anemia, unspecified: Secondary | ICD-10-CM

## 2019-11-20 LAB — CBC WITH DIFFERENTIAL (CANCER CENTER ONLY)
Abs Immature Granulocytes: 0.02 10*3/uL (ref 0.00–0.07)
Basophils Absolute: 0.1 10*3/uL (ref 0.0–0.1)
Basophils Relative: 1 %
Eosinophils Absolute: 0.3 10*3/uL (ref 0.0–0.5)
Eosinophils Relative: 6 %
HCT: 30.3 % — ABNORMAL LOW (ref 36.0–46.0)
Hemoglobin: 9.9 g/dL — ABNORMAL LOW (ref 12.0–15.0)
Immature Granulocytes: 0 %
Lymphocytes Relative: 24 %
Lymphs Abs: 1.2 10*3/uL (ref 0.7–4.0)
MCH: 30.7 pg (ref 26.0–34.0)
MCHC: 32.7 g/dL (ref 30.0–36.0)
MCV: 93.8 fL (ref 80.0–100.0)
Monocytes Absolute: 0.4 10*3/uL (ref 0.1–1.0)
Monocytes Relative: 8 %
Neutro Abs: 3.1 10*3/uL (ref 1.7–7.7)
Neutrophils Relative %: 61 %
Platelet Count: 178 10*3/uL (ref 150–400)
RBC: 3.23 MIL/uL — ABNORMAL LOW (ref 3.87–5.11)
RDW: 16.7 % — ABNORMAL HIGH (ref 11.5–15.5)
WBC Count: 5.2 10*3/uL (ref 4.0–10.5)
nRBC: 0 % (ref 0.0–0.2)

## 2019-11-20 LAB — IRON AND TIBC
Iron: 97 ug/dL (ref 41–142)
Saturation Ratios: 50 % (ref 21–57)
TIBC: 194 ug/dL — ABNORMAL LOW (ref 236–444)
UIBC: 97 ug/dL — ABNORMAL LOW (ref 120–384)

## 2019-11-20 LAB — FERRITIN: Ferritin: 373 ng/mL — ABNORMAL HIGH (ref 11–307)

## 2019-11-20 LAB — FOLATE: Folate: 10 ng/mL (ref >5.9)

## 2019-11-20 MED ORDER — FOLIC ACID 1 MG PO TABS: 1.0000 mg | ORAL_TABLET | Freq: Every day | ORAL | 5 refills | Status: DC

## 2019-11-20 NOTE — Telephone Encounter (Signed)
Scheduled appointments per 9/9 los. Gave patient calendar print out.  

## 2019-11-25 ENCOUNTER — Ambulatory Visit: Payer: 59 | Admitting: Family Medicine

## 2019-12-01 ENCOUNTER — Ambulatory Visit: Payer: 59 | Admitting: Gastroenterology

## 2019-12-10 ENCOUNTER — Other Ambulatory Visit: Payer: Self-pay | Admitting: Family Medicine

## 2019-12-10 DIAGNOSIS — K209 Esophagitis, unspecified without bleeding: Secondary | ICD-10-CM

## 2019-12-27 ENCOUNTER — Other Ambulatory Visit: Payer: Self-pay | Admitting: Family Medicine

## 2019-12-27 DIAGNOSIS — K209 Esophagitis, unspecified without bleeding: Secondary | ICD-10-CM

## 2020-01-21 ENCOUNTER — Ambulatory Visit: Payer: 59

## 2020-01-22 ENCOUNTER — Ambulatory Visit (HOSPITAL_COMMUNITY)
Admission: RE | Admit: 2020-01-22 | Discharge: 2020-01-22 | Disposition: A | Discharge status: Home / Self Care | Payer: 59 | Source: Ambulatory Visit | Attending: Urgent Care | Admitting: Urgent Care

## 2020-01-22 ENCOUNTER — Other Ambulatory Visit: Payer: Self-pay

## 2020-01-22 ENCOUNTER — Encounter (HOSPITAL_COMMUNITY): Payer: Self-pay

## 2020-01-22 VITALS — BP 126/88 | HR 81 | Temp 98.1°F | Resp 17

## 2020-01-22 DIAGNOSIS — R300 Dysuria: Secondary | ICD-10-CM

## 2020-01-22 DIAGNOSIS — N3001 Acute cystitis with hematuria: Secondary | ICD-10-CM

## 2020-01-22 LAB — POCT URINALYSIS DIPSTICK, ED / UC
Bilirubin Urine: NEGATIVE
Glucose, UA: NEGATIVE mg/dL
Ketones, ur: NEGATIVE mg/dL
Nitrite: POSITIVE — AB
Protein, ur: 100 mg/dL — AB
Specific Gravity, Urine: 1.015 (ref 1.005–1.030)
Urobilinogen, UA: 0.2 mg/dL (ref 0.0–1.0)
pH: 6 (ref 5.0–8.0)

## 2020-01-22 MED ORDER — SULFAMETHOXAZOLE-TRIMETHOPRIM 800-160 MG PO TABS: 1.0000 | ORAL_TABLET | Freq: Two times a day (BID) | ORAL | 0 refills | Status: DC

## 2020-01-22 MED ORDER — FLUCONAZOLE 150 MG PO TABS: 150.0000 mg | ORAL_TABLET | ORAL | 0 refills | Status: DC

## 2020-01-22 NOTE — ED Provider Notes (Signed)
[name]   MRN: [id number] DOB: 05/14/1975  Subjective:   Kristin [name] is a 44 y.o. female presenting for 2-week history of persistent dysuria, cloudy and malodorous urine.  Patient states that she has difficulty with recurrent UTIs.  Reports aggravating factor is that she holds her urine while she is at work.  She tries to hydrate but does not always do well with this either.  Has had some intermittent low back pain.  Denies fever, chills, nausea, vomiting, abdominal pain, pelvic pain, frank hematuria.  No current facility-administered medications for this encounter.  Current Outpatient Medications:  .  famotidine (PEPCID) 20 MG tablet, TAKE 1 TABLET(20 MG) BY MOUTH TWICE DAILY, Disp: 30 tablet, Rfl: 0 .  acetaminophen (TYLENOL) 500 MG tablet, Take 1 tablet (500 mg total) by mouth every 8 (eight) hours as needed for mild pain., Disp: 30 tablet, Rfl: 1 .  folic acid (FOLVITE) 1 MG tablet, Take 1 tablet (1 mg total) by mouth daily., Disp: 30 tablet, Rfl: 5 .  gabapentin (NEURONTIN) 300 MG capsule, Take 1 capsule (300 mg total) by mouth 3 (three) times daily., Disp: 90 capsule, Rfl: 2 .  hydrOXYzine (VISTARIL) 25 MG capsule, Take 1 capsule (25 mg total) by mouth daily as needed for anxiety., Disp: 20 capsule, Rfl: 1 .  lisinopril-hydrochlorothiazide (ZESTORETIC) 10-12.5 MG tablet, Take 1 tablet by mouth daily., Disp: 90 tablet, Rfl: 3 .  magnesium oxide (MAG-OX) 400 MG tablet, Take 1 tablet (400 mg total) by mouth daily., Disp: 10 tablet, Rfl: 0 .  Multiple Vitamin (MULTIVITAMIN WITH MINERALS) TABS tablet, Take 1 tablet by mouth daily., Disp: , Rfl:  .  OLANZapine (ZYPREXA) 5 MG tablet, Take 1 tablet (5 mg total) by mouth at bedtime., Disp: 90 tablet, Rfl: 1 .  ondansetron (ZOFRAN) 4 MG tablet, Take 1 tablet (4 mg total) by mouth every 8 (eight) hours as needed for nausea or vomiting., Disp: 20 tablet, Rfl: 0 .  pantoprazole (PROTONIX) 40 MG tablet, Take 1 tablet (40 mg  total) by mouth daily., Disp: 30 tablet, Rfl: 1 .  polyethylene glycol (MIRALAX / GLYCOLAX) 17 g packet, Take 17 g by mouth daily as needed for mild constipation., Disp: 14 each, Rfl: 0 .  thiamine 100 MG tablet, Take 1 tablet (100 mg total) by mouth daily., Disp: 30 tablet, Rfl: 0 .  tiZANidine (ZANAFLEX) 4 MG tablet, Take 1-2 tablets (4-8 mg total) by mouth every 6 (six) hours as needed for muscle spasms., Disp: 21 tablet, Rfl: 0   Allergies  Allergen Reactions  . Sumatriptan Anaphylaxis    High blood pressure and numb  . Other     Bananas-stomach cramps  . Eggs Or Egg-Derived Products Rash and Other (See Comments)    Stomach cramps    Past Medical History:  Diagnosis Date  . Acid reflux   . ACL tear 2011   not sure which side  . Acute lateral meniscus tear of right knee   . Anxiety   . Arthritis   . Back pain    slipped disc lower back lumbar  . Bipolar 1 disorder (HCC)   . Depression   . Dysfunctional uterine bleeding   . Eczema   . Hypertension   . Insomnia   . Migraine headache   . Neuromuscular disorder (HCC)    leg cramps     Past Surgical History:  Procedure Laterality Date  . BIOPSY  09/19/2017   Procedure: BIOPSY;  Surgeon: Levora Angel,  Darcus Austin, MD;  Location: [facility];  Service: Gastroenterology;;  . BIOPSY  10/15/2019   Procedure: BIOPSY;  Surgeon: Charlott Rakes, MD;  Location: [facility];  Service: Endoscopy;;  . COLONOSCOPY N/A 10/15/2019   Procedure: COLONOSCOPY;  Surgeon: Charlott Rakes, MD;  Location: [facility];  Service: Endoscopy;  Laterality: N/A;  . DILITATION & CURRETTAGE/HYSTROSCOPY WITH HYDROTHERMAL ABLATION N/A 04/04/2017   Procedure: DILATATION & CURETTAGE/HYSTEROSCOPY WITH HYDROTHERMAL ABLATION;  Surgeon: Reva Bores, MD;  Location: [facility];  Service: Gynecology;  Laterality: N/A;  . ESOPHAGOGASTRODUODENOSCOPY N/A 10/15/2019   Procedure: ESOPHAGOGASTRODUODENOSCOPY (EGD);  Surgeon: Charlott Rakes, MD;  Location: [facility];  Service: Endoscopy;  Laterality: N/A;  . ESOPHAGOGASTRODUODENOSCOPY (EGD) WITH PROPOFOL N/A 09/19/2017   Procedure: ESOPHAGOGASTRODUODENOSCOPY (EGD) WITH PROPOFOL;  Surgeon: Kathi Der, MD;  Location: [facility];  Service: Gastroenterology;  Laterality: N/A;  . KNEE ARTHROSCOPY WITH LATERAL MENISECTOMY Right 09/30/2019   Procedure: KNEE ARTHROSCOPY WITH LATERAL MENISECTOMY;  Surgeon: Sheral Apley, MD;  Location: [facility];  Service: Orthopedics;  Laterality: Right;  . TUBAL LIGATION  yrs ago    Family History  Problem Relation Age of Onset  . Breast cancer Maternal Grandmother   . Hypertension Mother   . Hypertension Brother   . Colon cancer Maternal Uncle   . Bipolar disorder Cousin   . Schizophrenia Cousin     Social History   Tobacco Use  . Smoking status: Current Some Day Smoker    Packs/day: 0.25    Years: 5.00    Pack years: 1.25    Types: Cigarettes  . Smokeless tobacco: Never Used  Vaping Use  . Vaping Use: Never used  Substance Use Topics  . Alcohol use: Yes    Comment: occassionally, longer than one week ago  . Drug use: No    ROS   Objective:   Vitals: BP 126/88 (BP Location: Left Arm)   Pulse 81   Temp 98.1 F (36.7 C) (Oral)   Resp 17   SpO2 96%   Physical Exam Constitutional:      General: She is not in acute distress.    Appearance: Normal appearance. She is well-developed. She is not ill-appearing, toxic-appearing or diaphoretic.  HENT:     Head: Normocephalic and atraumatic.     Nose: Nose normal.     Mouth/Throat:     Mouth: Mucous membranes are moist.     Pharynx: Oropharynx is clear.  Eyes:     General: No scleral icterus.    Extraocular Movements: Extraocular movements intact.     Pupils: Pupils are equal, round, and reactive to light.  Cardiovascular:     Rate and Rhythm: Normal rate.  Pulmonary:     Effort: Pulmonary effort is normal.  Abdominal:     Tenderness: There is no right CVA  tenderness or left CVA tenderness.  Skin:    General: Skin is warm and dry.  Neurological:     General: No focal deficit present.     Mental Status: She is alert and oriented to person, place, and time.  Psychiatric:        Mood and Affect: Mood normal.        Behavior: Behavior normal.        Thought Content: Thought content normal.        Judgment: Judgment normal.     Results for orders placed or performed during the hospital encounter of 01/22/20 (from the past 24 hour(s))  POC Urinalysis dipstick  Status: Abnormal   Collection Time: 01/22/20  5:25 PM  Result Value Ref Range   Glucose, UA NEGATIVE NEGATIVE mg/dL   Bilirubin Urine NEGATIVE NEGATIVE   Ketones, ur NEGATIVE NEGATIVE mg/dL   Specific Gravity, Urine 1.015 1.005 - 1.030   Hgb urine dipstick SMALL (A) NEGATIVE   pH 6.0 5.0 - 8.0   Protein, ur 100 (A) NEGATIVE mg/dL   Urobilinogen, UA 0.2 0.0 - 1.0 mg/dL   Nitrite POSITIVE (A) NEGATIVE   Leukocytes,Ua LARGE (A) NEGATIVE    Assessment and Plan :   PDMP not reviewed this encounter.  1. Acute cystitis with hematuria   2. Dysuria     Start Bactrim to cover for acute cystitis, urine culture pending.  Recommended aggressive hydration, limiting urinary irritants. Counseled patient on potential for adverse effects with medications prescribed/recommended today, ER and return-to-clinic precautions discussed, patient verbalized understanding.    Wallis Bamberg, PA-C 01/22/20 1753

## 2020-01-22 NOTE — ED Triage Notes (Signed)
Pt reports having discomforts when urinating, cloudy urine x 2 weeks. States urine "smells like eggs". Denies fever, chills,nausea, back pain.

## 2020-01-22 NOTE — Discharge Instructions (Signed)
Make sure you hydrate very well with plain water and a quantity of 64 ounces of water a day.  Please limit drinks that are considered urinary irritants such as soda, sweet tea, coffee, energy drinks, alcohol.  These can worsen your UTI symptoms and also be the source of them.  I will let you know about your urine culture results through MyChart to see if we need to change your antibiotics based off of those results. ° °

## 2020-01-24 LAB — URINE CULTURE: Culture: >=100000 — AB

## 2020-01-26 ENCOUNTER — Telehealth (HOSPITAL_COMMUNITY): Payer: Self-pay | Admitting: Emergency Medicine

## 2020-01-26 MED ORDER — NITROFURANTOIN MONOHYD MACRO 100 MG PO CAPS: 100.0000 mg | ORAL_CAPSULE | Freq: Two times a day (BID) | ORAL | 0 refills | Status: DC

## 2020-01-28 ENCOUNTER — Telehealth (HOSPITAL_COMMUNITY): Payer: Self-pay | Admitting: Emergency Medicine

## 2020-01-28 MED ORDER — FLUCONAZOLE 150 MG PO TABS: 150.0000 mg | ORAL_TABLET | Freq: Once | ORAL | 0 refills | Status: AC

## 2020-01-28 NOTE — Telephone Encounter (Signed)
Patient states she will need an additional Diflucan pill to help with the extension of her antibiotics.  Reviewed with Dr. Tracie Harrier, verbal received, prescription sent

## 2020-02-09 ENCOUNTER — Emergency Department (HOSPITAL_COMMUNITY)
Admission: EM | Admit: 2020-02-09 | Discharge: 2020-02-09 | Disposition: A | Discharge status: Home / Self Care | Payer: 59 | Attending: Emergency Medicine | Admitting: Emergency Medicine

## 2020-02-09 ENCOUNTER — Other Ambulatory Visit: Payer: Self-pay

## 2020-02-09 ENCOUNTER — Encounter (HOSPITAL_COMMUNITY): Payer: Self-pay | Admitting: Emergency Medicine

## 2020-02-09 DIAGNOSIS — F17210 Nicotine dependence, cigarettes, uncomplicated: Secondary | ICD-10-CM | POA: Insufficient documentation

## 2020-02-09 DIAGNOSIS — I10 Essential (primary) hypertension: Secondary | ICD-10-CM | POA: Diagnosis not present

## 2020-02-09 DIAGNOSIS — N390 Urinary tract infection, site not specified: Secondary | ICD-10-CM | POA: Diagnosis not present

## 2020-02-09 DIAGNOSIS — Z79899 Other long term (current) drug therapy: Secondary | ICD-10-CM | POA: Diagnosis not present

## 2020-02-09 DIAGNOSIS — R350 Frequency of micturition: Secondary | ICD-10-CM | POA: Diagnosis present

## 2020-02-09 LAB — URINALYSIS, ROUTINE W REFLEX MICROSCOPIC
Bilirubin Urine: NEGATIVE
Glucose, UA: NEGATIVE mg/dL
Hgb urine dipstick: NEGATIVE
Ketones, ur: 80 mg/dL — AB
Nitrite: POSITIVE — AB
Protein, ur: 100 mg/dL — AB
Specific Gravity, Urine: 1.027 (ref 1.005–1.030)
WBC, UA: >50 WBC/hpf — ABNORMAL HIGH (ref 0–5)
pH: 5 (ref 5.0–8.0)

## 2020-02-09 MED ORDER — CEPHALEXIN 500 MG PO CAPS: 500.0000 mg | ORAL_CAPSULE | Freq: Four times a day (QID) | ORAL | 0 refills | Status: AC

## 2020-02-09 NOTE — Discharge Instructions (Signed)
Please pick up your prescription and take as prescribed for the next week. You will need to have your urine rechecked in 2 weeks time. Follow up with your PCP for same.   We have sent your urine for culture and will call in 2-3 days time IF the antibiotic needs to be changed.   Return to the ED IMMEDIATELY for any worsening symptoms including fevers > 100.4, excessive vomiting, back pain, inability to urinate, or any other new/concerning symptoms

## 2020-02-09 NOTE — ED Provider Notes (Signed)
[facility] EMERGENCY DEPARTMENT Provider Note   CSN: [id number] Arrival date & time: 02/09/20  1348     History Chief Complaint  Patient presents with  . Urinary Tract Infection    Kristin [name] is a 44 y.o. female who presents to the ED today with complaint of urinary frequency and malodorous urine for the past 2 weeks.  Reports that she went to urgent care and was diagnosed with a UTI, was initially placed on one antibiotic which she does not recall the name of.  She states that she was called a couple of days later with her culture report and changed medication which she took fully.  She states that it has not helped and she continues to have the symptoms prompting her to come to the ED today.  She states she has issues with recurrent UTIs that she holds her urine at work.  Per triage report patient was also complaining of acid reflux for the past few days with persistent feeling of not being able to eat or drink however she states that she will follow up with her PCP for same.  She had a urinalysis done while she was in the waiting room and has been there for several hours, saw that she has a UTI and is only requesting antibiotics at this time.  Patient denies fevers, chills, abdominal pain, nausea, vomiting, vaginal discharge, pelvic pain, any other associated symptoms.  The history is provided by the patient and medical records.       Past Medical History:  Diagnosis Date  . Acid reflux   . ACL tear 2011   not sure which side  . Acute lateral meniscus tear of right knee   . Anxiety   . Arthritis   . Back pain    slipped disc lower back lumbar  . Bipolar 1 disorder (HCC)   . Depression   . Dysfunctional uterine bleeding   . Eczema   . Hypertension   . Insomnia   . Migraine headache   . Neuromuscular disorder (HCC)    leg cramps    Patient Active Problem List   Diagnosis Date Noted  . Anemia   . Syncope 10/10/2019  . UTI (urinary tract infection)  08/06/2019  . Frequent urination 07/29/2019  . Anxiety 07/07/2019  . Suicidal ideation 08/08/2018  . Vaginal irritation 03/07/2018  . Acute cystitis without hematuria 11/09/2017  . Injury of toe on right foot 10/16/2017  . Esophagitis determined by endoscopy   . Dehydration   . Nausea & vomiting 09/18/2017  . AKI (acute kidney injury) (HCC)   . Bipolar disorder in remission (HCC)   . Back pain 08/25/2016  . Generalized anxiety disorder 08/23/2015  . Osteoarthritis of right knee 12/21/2014  . Dysuria 04/22/2014  . Vaginal discharge 04/22/2014  . Tinea pedis of both feet 10/02/2013  . Menorrhagia 09/04/2012  . Allergic rhinitis 06/12/2012  . Eczema 03/26/2012  . Alcohol abuse 10/26/2010  . Bipolar 1 disorder (HCC) 10/09/2010  . Panic attacks 10/09/2010  . INSOMNIA, CHRONIC 06/29/2009  . TOBACCO USER 01/23/2009  . Obesity 10/29/2008  . Essential hypertension 12/20/2006    Past Surgical History:  Procedure Laterality Date  . BIOPSY  09/19/2017   Procedure: BIOPSY;  Surgeon: Kathi Der, MD;  Location: [facility];  Service: Gastroenterology;;  . BIOPSY  10/15/2019   Procedure: BIOPSY;  Surgeon: Charlott Rakes, MD;  Location: [facility];  Service: Endoscopy;;  . COLONOSCOPY N/A 10/15/2019   Procedure: COLONOSCOPY;  Surgeon: Charlott Rakes, MD;  Location: [facility];  Service: Endoscopy;  Laterality: N/A;  . DILITATION & CURRETTAGE/HYSTROSCOPY WITH HYDROTHERMAL ABLATION N/A 04/04/2017   Procedure: DILATATION & CURETTAGE/HYSTEROSCOPY WITH HYDROTHERMAL ABLATION;  Surgeon: Reva Bores, MD;  Location: [facility];  Service: Gynecology;  Laterality: N/A;  . ESOPHAGOGASTRODUODENOSCOPY N/A 10/15/2019   Procedure: ESOPHAGOGASTRODUODENOSCOPY (EGD);  Surgeon: Charlott Rakes, MD;  Location: [facility];  Service: Endoscopy;  Laterality: N/A;  . ESOPHAGOGASTRODUODENOSCOPY (EGD) WITH PROPOFOL N/A 09/19/2017   Procedure: ESOPHAGOGASTRODUODENOSCOPY (EGD) WITH  PROPOFOL;  Surgeon: Kathi Der, MD;  Location: [facility];  Service: Gastroenterology;  Laterality: N/A;  . KNEE ARTHROSCOPY WITH LATERAL MENISECTOMY Right 09/30/2019   Procedure: KNEE ARTHROSCOPY WITH LATERAL MENISECTOMY;  Surgeon: Sheral Apley, MD;  Location: [facility];  Service: Orthopedics;  Laterality: Right;  . TUBAL LIGATION  yrs ago     OB History    Gravida  4   Para  3   Term  3   Preterm      AB  1   Living  3     SAB      TAB  1   Ectopic      Multiple      Live Births              Family History  Problem Relation Age of Onset  . Breast cancer Maternal Grandmother   . Hypertension Mother   . Hypertension Brother   . Colon cancer Maternal Uncle   . Bipolar disorder Cousin   . Schizophrenia Cousin     Social History   Tobacco Use  . Smoking status: Current Some Day Smoker    Packs/day: 0.25    Years: 5.00    Pack years: 1.25    Types: Cigarettes  . Smokeless tobacco: Never Used  Vaping Use  . Vaping Use: Never used  Substance Use Topics  . Alcohol use: Yes    Comment: occassionally, longer than one week ago  . Drug use: No    Home Medications Prior to Admission medications   Medication Sig Start Date End Date Taking? Authorizing Provider  famotidine (PEPCID) 20 MG tablet TAKE 1 TABLET(20 MG) BY MOUTH TWICE DAILY 12/29/19   Derrel Nip, MD  acetaminophen (TYLENOL) 500 MG tablet Take 1 tablet (500 mg total) by mouth every 8 (eight) hours as needed for mild pain. 07/04/19   Marthenia Rolling, DO  cephALEXin (KEFLEX) 500 MG capsule Take 1 capsule (500 mg total) by mouth 4 (four) times daily for 7 days. 02/09/20 02/16/20  Tanda Rockers, PA-C  folic acid (FOLVITE) 1 MG tablet Take 1 tablet (1 mg total) by mouth daily. 11/20/19   Malachy Mood, MD  gabapentin (NEURONTIN) 300 MG capsule Take 1 capsule (300 mg total) by mouth 3 (three) times daily. 10/28/19   Derrel Nip, MD  hydrOXYzine (VISTARIL) 25 MG capsule Take 1  capsule (25 mg total) by mouth daily as needed for anxiety. 07/04/19   Marthenia Rolling, DO  lisinopril-hydrochlorothiazide (ZESTORETIC) 10-12.5 MG tablet Take 1 tablet by mouth daily. 11/11/19   Derrel Nip, MD  magnesium oxide (MAG-OX) 400 MG tablet Take 1 tablet (400 mg total) by mouth daily. 09/03/19   Renne Crigler, PA-C  Multiple Vitamin (MULTIVITAMIN WITH MINERALS) TABS tablet Take 1 tablet by mouth daily. 10/13/19   Kathlen Mody, MD  nitrofurantoin, macrocrystal-monohydrate, (MACROBID) 100 MG capsule Take 1 capsule (100 mg total) by mouth 2 (two) times daily. 01/26/20   Lamptey,  Britta Mccreedy, MD  OLANZapine (ZYPREXA) 5 MG tablet Take 1 tablet (5 mg total) by mouth at bedtime. 09/23/19   Derrel Nip, MD  ondansetron (ZOFRAN) 4 MG tablet Take 1 tablet (4 mg total) by mouth every 8 (eight) hours as needed for nausea or vomiting. 09/30/19   McBane, Jerald Kief, PA-C  pantoprazole (PROTONIX) 40 MG tablet Take 1 tablet (40 mg total) by mouth daily. 10/13/19   Kathlen Mody, MD  polyethylene glycol (MIRALAX / GLYCOLAX) 17 g packet Take 17 g by mouth daily as needed for mild constipation. 10/12/19   Kathlen Mody, MD  sulfamethoxazole-trimethoprim (BACTRIM DS) 800-160 MG tablet Take 1 tablet by mouth 2 (two) times daily. 01/22/20   Wallis Bamberg, PA-C  thiamine 100 MG tablet Take 1 tablet (100 mg total) by mouth daily. 10/13/19   Kathlen Mody, MD  tiZANidine (ZANAFLEX) 4 MG tablet Take 1-2 tablets (4-8 mg total) by mouth every 6 (six) hours as needed for muscle spasms. 07/04/19   Marthenia Rolling, DO  famotidine (PEPCID) 20 MG tablet TAKE 1 TABLET(20 MG) BY MOUTH TWICE DAILY 09/29/19   Derrel Nip, MD  famotidine (PEPCID) 20 MG tablet TAKE 1 TABLET(20 MG) BY MOUTH TWICE DAILY 11/13/19   Derrel Nip, MD  famotidine (PEPCID) 20 MG tablet TAKE 1 TABLET(20 MG) BY MOUTH TWICE DAILY 12/10/19   Derrel Nip, MD  fluticasone (FLONASE) 50 MCG/ACT nasal spray Place 2 sprays into both nostrils daily. Patient taking  differently: Place 2 sprays into both nostrils daily as needed for allergies.  02/25/17 04/14/19  Belinda Fisher, PA-C  promethazine (PHENERGAN) 25 MG tablet Take 1 tablet (25 mg total) by mouth every 6 (six) hours as needed for nausea or vomiting. 04/17/18 08/29/18  Jacalyn Lefevre, MD    Allergies    Sumatriptan, Other, and Eggs or egg-derived products  Review of Systems   Review of Systems  Constitutional: Negative for chills and fever.  Genitourinary: Positive for frequency. Negative for difficulty urinating and dysuria.    Physical Exam Updated Vital Signs BP 122/84   Pulse 81   Temp 98.1 F (36.7 C) (Oral)   Resp 18   Ht 5' 4.5" (1.638 m)   Wt 72.6 kg   SpO2 99%   BMI 27.04 kg/m   Physical Exam Vitals and nursing note reviewed.  Constitutional:      Appearance: She is not ill-appearing.  HENT:     Head: Normocephalic and atraumatic.  Eyes:     Conjunctiva/sclera: Conjunctivae normal.  Cardiovascular:     Rate and Rhythm: Normal rate and regular rhythm.     Pulses: Normal pulses.  Pulmonary:     Effort: Pulmonary effort is normal.     Breath sounds: Normal breath sounds. No wheezing, rhonchi or rales.  Skin:    General: Skin is warm and dry.     Coloration: Skin is not jaundiced.  Neurological:     Mental Status: She is alert.     ED Results / Procedures / Treatments   Labs (all labs ordered are listed, but only abnormal results are displayed) Labs Reviewed  URINALYSIS, ROUTINE W REFLEX MICROSCOPIC - Abnormal; Notable for the following components:      Result Value   APPearance CLOUDY (*)    Ketones, ur 80 (*)    Protein, ur 100 (*)    Nitrite POSITIVE (*)    Leukocytes,Ua LARGE (*)    WBC, UA >50 (*)    Bacteria, UA MANY (*)  Non Squamous Epithelial 0-5 (*)    All other components within normal limits  URINE CULTURE    EKG None  Radiology No results found.  Procedures Procedures (including critical care time)  Medications Ordered in  ED Medications - No data to display  ED Course  I have reviewed the triage vital signs and the nursing notes.  Pertinent labs & imaging results that were available during my care of the patient were reviewed by me and considered in my medical decision making (see chart for details).    MDM Rules/Calculators/A&P                          44 year old presenting to the ED today with complaint of persistent urinary tract symptoms.  Was seen at urgent care 2 weeks ago and initially treated with Bactrim however culture report grew resistant to Bactrim and she was changed to Macrobid.  She states she took the entire course however continues to have symptoms.  Per urine culture report patient grew greater than 100,000 colonies of E. coli.  It does appear that she is sensitive to cefazolin, cefepime, ceftriaxone and therefore will provide patient with Keflex at this time.  The only other oral antibiotic that she is sensitive to besides Macrobid which she has already been on is ciprofloxacin which I would rather avoid.  Will have patient follow-up with her PCP for same and have urinalysis rechecked in 2 weeks.  Patient is in agreement with plan at this time and stable for discharge.  This note was prepared using Dragon voice recognition software and may include unintentional dictation errors due to the inherent limitations of voice recognition software.   Final Clinical Impression(s) / ED Diagnoses Final diagnoses:  Lower urinary tract infectious disease    Rx / DC Orders ED Discharge Orders         Ordered    cephALEXin (KEFLEX) 500 MG capsule  4 times daily        02/09/20 2106           Discharge Instructions     Please pick up your prescription and take as prescribed for the next week. You will need to have your urine rechecked in 2 weeks time. Follow up with your PCP for same.   We have sent your urine for culture and will call in 2-3 days time IF the antibiotic needs to be changed.    Return to the ED IMMEDIATELY for any worsening symptoms including fevers > 100.4, excessive vomiting, back pain, inability to urinate, or any other new/concerning symptoms       Tanda Rockers, Cordelia Poche 02/09/20 2127    Gerhard Munch, MD 02/09/20 2258

## 2020-02-09 NOTE — ED Triage Notes (Signed)
Bilateral leg pain for the past few days and acid reflux not able to eat or drink, pt denies any injury states she has chronic arthritis.

## 2020-02-09 NOTE — ED Notes (Signed)
Pt c/o UTI after triage completed that is not getting better with the Abx prescribed by pcp.

## 2020-02-11 NOTE — Progress Notes (Deleted)
    SUBJECTIVE:   CHIEF COMPLAINT / HPI:   Numbness:   hypocalcemia  PERTINENT  PMH / PSH: ***  OBJECTIVE:   There were no vitals taken for this visit.  ***  ASSESSMENT/PLAN:   No problem-specific Assessment & Plan notes found for this encounter.     Sandre Kitty, MD [facility]

## 2020-02-12 ENCOUNTER — Ambulatory Visit: Payer: 59 | Admitting: Family Medicine

## 2020-02-12 ENCOUNTER — Other Ambulatory Visit: Payer: Self-pay

## 2020-02-12 LAB — URINE CULTURE: Culture: >=100000 — AB

## 2020-02-13 ENCOUNTER — Telehealth: Payer: Self-pay | Admitting: *Deleted

## 2020-02-13 ENCOUNTER — Ambulatory Visit (INDEPENDENT_AMBULATORY_CARE_PROVIDER_SITE_OTHER): Payer: 59 | Admitting: Student in an Organized Health Care Education/Training Program

## 2020-02-13 ENCOUNTER — Other Ambulatory Visit: Payer: Self-pay

## 2020-02-13 VITALS — BP 141/80 | HR 101 | Ht 64.5 in | Wt 171.4 lb

## 2020-02-13 DIAGNOSIS — R32 Unspecified urinary incontinence: Secondary | ICD-10-CM

## 2020-02-13 DIAGNOSIS — B379 Candidiasis, unspecified: Secondary | ICD-10-CM | POA: Diagnosis not present

## 2020-02-13 DIAGNOSIS — T3695XA Adverse effect of unspecified systemic antibiotic, initial encounter: Secondary | ICD-10-CM | POA: Diagnosis not present

## 2020-02-13 DIAGNOSIS — R29898 Other symptoms and signs involving the musculoskeletal system: Secondary | ICD-10-CM | POA: Diagnosis not present

## 2020-02-13 MED ORDER — MICONAZOLE NITRATE 100 MG VA SUPP: 100.0000 mg | Freq: Every day | VAGINAL | 0 refills | Status: DC

## 2020-02-13 MED ORDER — NAPROXEN 250 MG PO TABS: 250.0000 mg | ORAL_TABLET | Freq: Two times a day (BID) | ORAL | 0 refills | Status: DC | PRN

## 2020-02-13 NOTE — Telephone Encounter (Signed)
Post ED Visit - Positive Culture Follow-up  Culture report reviewed by antimicrobial stewardship pharmacist: [name] []  Nathan Batchelder, Pharm.D. []  , Pharm.D., BCPS AQ-ID []  , Pharm.D., BCPS []  Celedonio Miyamoto, .D., BCPS []  [place], .D., BCPS, AAHIVP []  Georgina Pillion, Pharm.D., BCPS, AAHIVP []  [address], PharmD, BCPS [x]  , PharmD, BCPS []  [place], PharmD, BCPS []  [address], PharmD []  , PharmD, BCPS []  Estella Husk, PharmD  Pharmacy Team []  Lysle Pearl, PharmD []  , PharmD []  Phillips Climes, PharmD []  , Rph []  Agapito Games) , PharmD []  Verlan Friends, PharmD []  , PharmD []  Mervyn Gay, PharmD []  , PharmD []  Vinnie Level, PharmD []  [facility], PharmD []  , PharmD []  Len Childs, PharmD   Positive urine culture Treated with Cephalexin, organism sensitive to the same and no further patient follow-up is required at this time.  [facility] 02/13/2020, 9:53 AM

## 2020-02-13 NOTE — Patient Instructions (Signed)
It was a pleasure to see you today!  To summarize our discussion for this visit:  i'm sorry to hear that your legs are causing you pain. I am concerned about the weakness. We are going to repeat a lumbar MRI and refer to PT. Our next steps will be determined by the MRI results. Please allow a few days for insurance approval and then someone will call to schedule the MRI.  I will send in a pain medication to take for a short term.   Some additional health maintenance measures we should update are: Health Maintenance Due  Topic Date Due  . Hepatitis C Screening  Never done  . COVID-19 Vaccine (1) Never done  . INFLUENZA VACCINE  Never done  . PAP SMEAR-Modifier  03/16/2020  .   Call the clinic at [phone] if your symptoms worsen or you have any concerns.   Thank you for allowing me to take part in your care,  Dr. Jamelle Rushing

## 2020-02-13 NOTE — Progress Notes (Signed)
   SUBJECTIVE:   CHIEF COMPLAINT / HPI: knee and feet pain  Both legs hurt symmetrically. Has h/o right arthroscopy of r knee. Follows with ortho. Has difficulty with going up and down stairs. 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  Uses biofreeze, heating pad, ibuprofen.  Has urinary incontinence which has been present for months to years. Denies numbness or tingling of her legs and no abnormal sensation when wiping after using bathroom. No rashes.  Still taking antibiotic for UTI. Having some vaginal itching but no discharge. She typically gets a yeast infection with antibiotic use and would like a prescription. Has not tried anything OTC.  OBJECTIVE:   BP (!) 141/80   Pulse (!) 101   Ht 5' 4.5" (1.638 m)   Wt 171 lb 6.4 oz (77.7 kg)   SpO2 98%   BMI 28.97 kg/m   General: NAD, pleasant, able to participate in exam LE: Bilateral lower extremities have symmetrical weakness at all joints which patient states is chronic. 3/5 strength. 2+ reflexes No edema Sensation to touch is intact and symmetrical bilaterally Able to ambulate and get on exam table Negative straight leg raise Skin: warm and dry, no rashes noted Neuro: alert and oriented, no focal deficits Psych: Normal affect and mood  ASSESSMENT/PLAN:   Lower extremity weakness Non-worsening chronic lower extremity pain is most concerning to patient. My concern today is severe weakness of lower extremities. Has history of lumbar MRI 2018 showing l4-l5 disc protrusion and canal narrowing. Progressive weakness is likely result of these findings worsening. No acute symptom changes requiring urgent imaging to r/o conus medullaris. Ordering diagnostic lumbar MRI to evaluate condition of previous findings if worsening and also for slight suspicion of MS. Discussed treatment options prior to MRI to include referral to PT and naproxen for pain.  Antibiotic-induced yeast infection Endorsing symptoms  similar to previous yeast infections induced by antibiotic use and is currently using antibiotics for UTI.  uti could be contributing to her pain currently but should resolve with treatment. - prescribe topical antifungal suppository     Leeroy Bock, DO [facility]

## 2020-02-15 DIAGNOSIS — R29898 Other symptoms and signs involving the musculoskeletal system: Secondary | ICD-10-CM | POA: Insufficient documentation

## 2020-02-15 DIAGNOSIS — B379 Candidiasis, unspecified: Secondary | ICD-10-CM | POA: Insufficient documentation

## 2020-02-15 DIAGNOSIS — T3695XA Adverse effect of unspecified systemic antibiotic, initial encounter: Secondary | ICD-10-CM | POA: Insufficient documentation

## 2020-02-15 NOTE — Assessment & Plan Note (Signed)
Non-worsening chronic lower extremity pain is most concerning to patient. My concern today is severe weakness of lower extremities. Has history of lumbar MRI 2018 showing l4-l5 disc protrusion and canal narrowing. Progressive weakness is likely result of these findings worsening. No acute symptom changes requiring urgent imaging to r/o conus medullaris. Ordering diagnostic lumbar MRI to evaluate condition of previous findings if worsening and also for slight suspicion of MS. Discussed treatment options prior to MRI to include referral to PT and naproxen for pain.

## 2020-02-15 NOTE — Assessment & Plan Note (Signed)
Endorsing symptoms similar to previous yeast infections induced by antibiotic use and is currently using antibiotics for UTI.  uti could be contributing to her pain currently but should resolve with treatment. - prescribe topical antifungal suppository

## 2020-02-16 ENCOUNTER — Telehealth: Payer: Self-pay

## 2020-02-16 NOTE — Telephone Encounter (Signed)
Patient calls nurse line reporting recent Miconazole prescription is not covered by her insurance. Patient reports she usually takes (2) diflucan for yeast and her insurance will cover. Patient requesting diflucan to her pharmacy. Will send to ordering prescriber.

## 2020-02-18 MED ORDER — FLUCONAZOLE 150 MG PO TABS: 150.0000 mg | ORAL_TABLET | Freq: Once | ORAL | 0 refills | Status: AC

## 2020-02-18 NOTE — Telephone Encounter (Signed)
Patient calling again to check the status of medication change.

## 2020-02-18 NOTE — Telephone Encounter (Signed)
Sent diflucan to pharmacy

## 2020-02-19 ENCOUNTER — Inpatient Hospital Stay: Payer: 59 | Attending: Hematology

## 2020-02-19 ENCOUNTER — Other Ambulatory Visit: Payer: Self-pay

## 2020-02-19 DIAGNOSIS — D649 Anemia, unspecified: Secondary | ICD-10-CM | POA: Insufficient documentation

## 2020-02-19 DIAGNOSIS — D509 Iron deficiency anemia, unspecified: Secondary | ICD-10-CM

## 2020-02-19 LAB — CBC WITH DIFFERENTIAL (CANCER CENTER ONLY)
Abs Immature Granulocytes: 0 10*3/uL (ref 0.00–0.07)
Basophils Absolute: 0 10*3/uL (ref 0.0–0.1)
Basophils Relative: 1 %
Eosinophils Absolute: 0.1 10*3/uL (ref 0.0–0.5)
Eosinophils Relative: 5 %
HCT: 33.1 % — ABNORMAL LOW (ref 36.0–46.0)
Hemoglobin: 11.1 g/dL — ABNORMAL LOW (ref 12.0–15.0)
Immature Granulocytes: 0 %
Lymphocytes Relative: 36 %
Lymphs Abs: 1 10*3/uL (ref 0.7–4.0)
MCH: 30.6 pg (ref 26.0–34.0)
MCHC: 33.5 g/dL (ref 30.0–36.0)
MCV: 91.2 fL (ref 80.0–100.0)
Monocytes Absolute: 0.4 10*3/uL (ref 0.1–1.0)
Monocytes Relative: 15 %
Neutro Abs: 1.2 10*3/uL — ABNORMAL LOW (ref 1.7–7.7)
Neutrophils Relative %: 43 %
Platelet Count: 153 10*3/uL (ref 150–400)
RBC: 3.63 MIL/uL — ABNORMAL LOW (ref 3.87–5.11)
RDW: 15.4 % (ref 11.5–15.5)
WBC Count: 2.8 10*3/uL — ABNORMAL LOW (ref 4.0–10.5)
nRBC: 0 % (ref 0.0–0.2)

## 2020-02-19 LAB — IRON AND TIBC
Iron: 181 ug/dL — ABNORMAL HIGH (ref 41–142)
Saturation Ratios: 96 % — ABNORMAL HIGH (ref 21–57)
TIBC: 189 ug/dL — ABNORMAL LOW (ref 236–444)
UIBC: 7 ug/dL — ABNORMAL LOW (ref 120–384)

## 2020-02-19 LAB — FERRITIN: Ferritin: 389 ng/mL — ABNORMAL HIGH (ref 11–307)

## 2020-02-19 LAB — FOLATE: Folate: >100 ng/mL (ref >5.9)

## 2020-02-25 ENCOUNTER — Other Ambulatory Visit: Payer: Self-pay | Admitting: Student in an Organized Health Care Education/Training Program

## 2020-03-10 ENCOUNTER — Telehealth: Payer: Self-pay

## 2020-03-10 NOTE — Telephone Encounter (Signed)
Patient calls nurse line reporting she has a MRI scheduled for 03/20/2020. Patient reports she is claustrophobic and needs a one time medication to take prior to imaging. Patient reports this has been done for her in the past. Please send to preferred [facility].

## 2020-03-11 ENCOUNTER — Ambulatory Visit: Payer: 59 | Attending: Family Medicine | Admitting: Physical Therapy

## 2020-03-11 ENCOUNTER — Ambulatory Visit: Payer: 59 | Admitting: Physical Therapy

## 2020-03-11 ENCOUNTER — Encounter: Payer: Self-pay | Admitting: Physical Therapy

## 2020-03-11 ENCOUNTER — Other Ambulatory Visit: Payer: Self-pay

## 2020-03-11 DIAGNOSIS — M25562 Pain in left knee: Secondary | ICD-10-CM | POA: Diagnosis present

## 2020-03-11 DIAGNOSIS — M6281 Muscle weakness (generalized): Secondary | ICD-10-CM | POA: Diagnosis present

## 2020-03-11 DIAGNOSIS — G8929 Other chronic pain: Secondary | ICD-10-CM | POA: Insufficient documentation

## 2020-03-11 DIAGNOSIS — R2689 Other abnormalities of gait and mobility: Secondary | ICD-10-CM | POA: Diagnosis present

## 2020-03-11 DIAGNOSIS — M545 Low back pain, unspecified: Secondary | ICD-10-CM | POA: Insufficient documentation

## 2020-03-11 DIAGNOSIS — M25561 Pain in right knee: Secondary | ICD-10-CM | POA: Diagnosis present

## 2020-03-11 NOTE — Therapy (Signed)
[facility] [address] [place], [place], [address] Phone: [phone]   Fax:  [phone]  Physical Therapy Evaluation  Patient Details  Name: Kristin [name] MRN: [id number] Date of Birth: 1976/01/10 Referring Provider (PT): Carney Living, MD   Encounter Date: 03/11/2020   PT End of Session - 03/11/20 1634    Visit Number 1    Number of Visits 6    Date for PT Re-Evaluation 04/22/20    Authorization Type [employer]    PT Start Time 1621    PT Stop Time 1700    PT Time Calculation (min) 39 min    Activity Tolerance Patient limited by pain    Behavior During Therapy [facility] for tasks assessed/performed           Past Medical History:  Diagnosis Date  . Acid reflux   . ACL tear 2011   not sure which side  . Acute lateral meniscus tear of right knee   . Anxiety   . Arthritis   . Back pain    slipped disc lower back lumbar  . Bipolar 1 disorder (HCC)   . Depression   . Dysfunctional uterine bleeding   . Eczema   . Hypertension   . Insomnia   . Migraine headache   . Neuromuscular disorder (HCC)    leg cramps    Past Surgical History:  Procedure Laterality Date  . BIOPSY  09/19/2017   Procedure: BIOPSY;  Surgeon: Kathi Der, MD;  Location: [facility];  Service: Gastroenterology;;  . BIOPSY  10/15/2019   Procedure: BIOPSY;  Surgeon: Charlott Rakes, MD;  Location: [facility];  Service: Endoscopy;;  . COLONOSCOPY N/A 10/15/2019   Procedure: COLONOSCOPY;  Surgeon: Charlott Rakes, MD;  Location: [facility];  Service: Endoscopy;  Laterality: N/A;  . DILITATION & CURRETTAGE/HYSTROSCOPY WITH HYDROTHERMAL ABLATION N/A 04/04/2017   Procedure: DILATATION & CURETTAGE/HYSTEROSCOPY WITH HYDROTHERMAL ABLATION;  Surgeon: Reva Bores, MD;  Location: [facility];  Service: Gynecology;  Laterality: N/A;  . ESOPHAGOGASTRODUODENOSCOPY N/A 10/15/2019   Procedure: ESOPHAGOGASTRODUODENOSCOPY (EGD);   Surgeon: Charlott Rakes, MD;  Location: [facility];  Service: Endoscopy;  Laterality: N/A;  . ESOPHAGOGASTRODUODENOSCOPY (EGD) WITH PROPOFOL N/A 09/19/2017   Procedure: ESOPHAGOGASTRODUODENOSCOPY (EGD) WITH PROPOFOL;  Surgeon: Kathi Der, MD;  Location: [facility];  Service: Gastroenterology;  Laterality: N/A;  . KNEE ARTHROSCOPY WITH LATERAL MENISECTOMY Right 09/30/2019   Procedure: KNEE ARTHROSCOPY WITH LATERAL MENISECTOMY;  Surgeon: Sheral Apley, MD;  Location: [facility];  Service: Orthopedics;  Laterality: Right;  . TUBAL LIGATION  yrs ago    There were no vitals filed for this visit.    Subjective Assessment - 03/11/20 1622    Subjective Patient reports she was told she had a slipped discin her back, states she has good days and bad days and today is a bad day. States her feet and legs are numb, some days she can walk and some days she can't. States this has been going on for years, but has gotten worse. Patient also notes bilateral knee pain and chronic arthritis, she has had previous surgery in the right knee but now both knees are bone on bone and extremely painful. States she has trouble sleeping at night because she is woken by her pain. Patient reports she sometimes has to walk with a cane because her legs will give out.    Limitations Sitting;Lifting;Standing;Walking;House hold activities    How long can you sit comfortably? 30  minutes    How long can you stand comfortably? 30 minutes    How long can you walk comfortably? "no good at walking"    Diagnostic tests Previous MRI in 2018, updated MRI scheduled for 03/20/2020    Patient Stated Goals Get pain better    Currently in Pain? Yes    Pain Score 10-Worst pain ever   20/10 at worse   Pain Location Generalized    Pain Orientation Right;Left;Lower    Pain Descriptors / Indicators Tightness;Sharp;Shooting;Numbness    Pain Type Chronic pain    Pain Radiating Towards "back down to feet"    Pain  Onset More than a month ago    Pain Frequency Constant    Aggravating Factors  Cold weather, sitting down too long, walking or standing too long    Pain Relieving Factors Moving around to get blood flowing              [facility] PT Assessment - 03/11/20 0001      Assessment   Medical Diagnosis Weakness of both lower extremities    Referring Provider (PT) Deirdre Priest, Estill Batten, MD    Onset Date/Surgical Date --   "for years"   Next MD Visit Not scheduled    Prior Therapy Patient unsure whether she has done therapy before      Precautions   Precautions None      Restrictions   Weight Bearing Restrictions No      Balance Screen   Has the patient fallen in the past 6 months Yes    How many times? "I stay falling"    Has the patient had a decrease in activity level because of a fear of falling?  No    Is the patient reluctant to leave their home because of a fear of falling?  No      Home Environment   Living Environment Private residence    Living Arrangements Children;Parent    Home Access Stairs to enter      Prior Function   Level of Independence Independent    Vocation Full time employment    Vocation Requirements CNA      Cognition   Overall Cognitive Status Within Functional Limits for tasks assessed      Observation/Other Assessments   Observations Patient appears in no apparent distress    Focus on Therapeutic Outcomes (FOTO)  26% functional status      Sensation   Light Touch Impaired by gross assessment    Additional Comments Patient reports numbness throughout BLE and feet, no specific dermatomal pattern      Coordination   Gross Motor Movements are Fluid and Coordinated Yes      Posture/Postural Control   Posture Comments Rounded shoulders, increased lumbar lordosis in standing with generally slouched posture seated      ROM / Strength   AROM / PROM / Strength AROM;PROM;Strength      AROM   Overall AROM Comments No directional perference noted with  repeated motion    AROM Assessment Site Lumbar    Lumbar Flexion Fingertips mid shin    Lumbar Extension 75%    Lumbar - Right Side Bend WFL    Lumbar - Left Side Bend WFL    Lumbar - Right Rotation WFL    Lumbar - Left Rotation WFL      PROM   Overall PROM Comments Unable to assess hip or knee PROM due to pain level      Strength  Strength Assessment Site Hip;Knee;Ankle    Right/Left Hip Right;Left    Right Hip Flexion 4-/5    Left Hip Flexion 4-/5    Right/Left Knee Right;Left    Right Knee Flexion 4-/5    Right Knee Extension 4-/5    Left Knee Flexion 4-/5    Left Knee Extension 4-/5    Right/Left Ankle Right;Left    Right Ankle Dorsiflexion 4-/5    Left Ankle Dorsiflexion 4-/5      Palpation   Patella mobility Unable to assess    Spinal mobility Unable to assess    Palpation comment Patient reports tenderness to light palpation throughout lumbar and BLE      Special Tests   Other special tests Unable to perform      Transfers   Transfers Independent with all Transfers      Ambulation/Gait   Gait Comments Patient ambulates with decreased knee flexion bilaterally (stiff legged gait), slight forward trunk lean                      Objective measurements completed on examination: See above findings.       [facility] Adult PT Treatment/Exercise - 03/11/20 0001      Exercises   Exercises Lumbar;Knee/Hip      Lumbar Exercises: Seated   Other Seated Lumbar Exercises Pelvic tilt 10 x 3 sec hold    Other Seated Lumbar Exercises Marching while maintaining neutral lumbar spine x 10      Knee/Hip Exercises: Seated   Long Arc Quad 10 reps    Clamshell with TheraBand Yellow   10 x 3 sec hold     Knee/Hip Exercises: Supine   Quad Sets 10 reps   5 sec hold   Quad Sets Limitations small towel roll under knee                  PT Education - 03/11/20 1634    Education Details Exam findings, POC, HEP    Person(s) Educated Patient    Methods  Explanation;Demonstration;Tactile cues;Verbal cues;Handout    Comprehension Verbalized understanding;Returned demonstration;Verbal cues required;Tactile cues required;Need further instruction            PT Short Term Goals - 03/11/20 1802      PT SHORT TERM GOAL #1   Title Patient will be I with initial HEP to progress with PT    Time 3    Period Weeks    Status New    Target Date 04/01/20      PT SHORT TERM GOAL #2   Title PT will review FOTO with patient by 3rd visit    Time 3    Period Weeks    Status New    Target Date 04/01/20             PT Long Term Goals - 03/11/20 1803      PT LONG TERM GOAL #1   Title Patient will be I with final HEP to maintain progress from PT    Time 6    Period Weeks    Status New    Target Date 04/22/20      PT LONG TERM GOAL #2   Title Patient will report </= 6/10 pain level with work related tasks to reduce functional limitations    Time 6    Period Weeks    Status New    Target Date 04/22/20      PT LONG TERM GOAL #3  Title Patient will be able to walk 30 minutes without limitation to improve community access and work    Time 6    Period Weeks    Status New    Target Date 04/22/20      PT LONG TERM GOAL #4   Title Patient will report improved functional status >/= 53% on FOTO    Time 6    Period Weeks    Status New    Target Date 04/22/20                  Plan - 03/11/20 1750    Clinical Impression Statement Patient presents to PT with report of chronic lower back and bilateral leg pain, with chronic bilateral knee pain. The evaluation was limited due to patient's high pain level and she became tearful during the examination due to knee pain. Based on the limited examinatoion, her knee pain seems separate from her lower back pain/radicular symptoms. She has had previous arthroscopic surgery on right knee and patient states it never got better, and now both knees are severely painful and seem to be the reason for  her frequent falls. She does demonstrate overall good lumbar motion without clear direction preference. She is reporting bilateral leg numbness and tingling without true dermatomal pattern. Patient reports increased pain when lying supine so most exercises provided were in seated position. Patient provided exercises to initiate light LE and core/hip strengthening. She would benefit from continued skilled PT to progress her mobility and strength in order to reduce pain and maximize her functional ability.    Personal Factors and Comorbidities Fitness;Past/Current Experience;Social Background;Time since onset of injury/illness/exacerbation;Comorbidity 1    Comorbidities Anxiety    Examination-Activity Limitations Locomotion Level;Lift;Stand;Stairs;Squat;Sleep;Sit;Bend    Examination-Participation Restrictions Meal Prep;Cleaning;Occupation;Community Activity;Shop;Laundry;Yard Work    Conservation officer, historic buildings Evolving/Moderate complexity    Clinical Decision Making Moderate    Rehab Potential Fair    PT Frequency 1x / week    PT Duration 6 weeks    PT Treatment/Interventions ADLs/Self Care Home Management;Cryotherapy;Electrical Stimulation;Iontophoresis 4mg /ml Dexamethasone;Moist Heat;Traction;Ultrasound;Neuromuscular re-education;Balance training;Therapeutic exercise;Therapeutic activities;Functional mobility training;Stair training;Gait training;Manual techniques;Patient/family education;Dry needling;Passive range of motion;Taping;Spinal Manipulations;Joint Manipulations    PT Next Visit Plan Review HEP and progress PRN, manual/modalities for pain relief, gradual LE strengthening and stretching, core activation and lumbar mobility    PT Home Exercise Plan [email or web address]    Recommended Other Services Ortho referral for bilateral knee pain    Consulted and Agree with Plan of Care Patient           Patient will benefit from skilled therapeutic intervention in order to improve the following  deficits and impairments:  Abnormal gait,Decreased range of motion,Difficulty walking,Decreased activity tolerance,Pain,Postural dysfunction,Decreased strength,Impaired sensation,Decreased balance,Impaired flexibility  Visit Diagnosis: Chronic bilateral low back pain, unspecified whether sciatica present  Chronic pain of left knee  Chronic pain of right knee  Muscle weakness (generalized)  Other abnormalities of gait and mobility     Problem List Patient Active Problem List   Diagnosis Date Noted  . Lower extremity weakness 02/15/2020  . Antibiotic-induced yeast infection 02/15/2020  . Anemia   . Syncope 10/10/2019  . UTI (urinary tract infection) 08/06/2019  . Frequent urination 07/29/2019  . Anxiety 07/07/2019  . Suicidal ideation 08/08/2018  . Vaginal irritation 03/07/2018  . Acute cystitis without hematuria 11/09/2017  . Injury of toe on right foot 10/16/2017  . Esophagitis determined by endoscopy   . Dehydration   . Nausea & vomiting 09/18/2017  .  AKI (acute kidney injury) (HCC)   . Bipolar disorder in remission (HCC)   . Back pain 08/25/2016  . Generalized anxiety disorder 08/23/2015  . Osteoarthritis of right knee 12/21/2014  . Dysuria 04/22/2014  . Vaginal discharge 04/22/2014  . Tinea pedis of both feet 10/02/2013  . Menorrhagia 09/04/2012  . Allergic rhinitis 06/12/2012  . Eczema 03/26/2012  . Alcohol abuse 10/26/2010  . Bipolar 1 disorder (HCC) 10/09/2010  . Panic attacks 10/09/2010  . INSOMNIA, CHRONIC 06/29/2009  . TOBACCO USER 01/23/2009  . Obesity 10/29/2008  . Essential hypertension 12/20/2006    Rosana Hoes, PT, DPT, LAT, ATC 03/11/20  6:16 PM Phone: [phone] Fax: [phone]   [facility] [address] [place], [place], [address] Phone: [phone]   Fax:  [phone]  Name: DIAMONIQUE RUEDAS MRN: [id number] Date of Birth: [birth year]

## 2020-03-11 NOTE — Telephone Encounter (Signed)
I attempted to call patient because I have been unable to find any records of her being prescribed medications for previous MRIs.  Patient did not answer the phone.  I asked her to call back so please let me know when she calls in I will call to discuss it with her.

## 2020-03-11 NOTE — Patient Instructions (Signed)
Access Code: [email or web address] URL: [email or web address] Date: 03/11/2020 Prepared by: Rosana Hoes  Exercises Supine Quad Set - 1-2 x daily - 7 x weekly - 10 reps - 5 seconds hold Seated Pelvic Tilt - 1-2 x daily - 7 x weekly - 10 reps - 3 seconds hold Seated March - 1-2 x daily - 7 x weekly - 10 reps Seated Long Arc Quad - 1-2 x daily - 7 x weekly - 10 reps Seated Hip Abduction with Resistance - 1-2 x daily - 7 x weekly - 10 reps - 3 seconds hold

## 2020-03-18 ENCOUNTER — Telehealth: Payer: Self-pay

## 2020-03-18 ENCOUNTER — Ambulatory Visit: Payer: Self-pay | Attending: Family Medicine

## 2020-03-18 ENCOUNTER — Other Ambulatory Visit: Payer: Self-pay | Admitting: Family Medicine

## 2020-03-18 DIAGNOSIS — M6281 Muscle weakness (generalized): Secondary | ICD-10-CM | POA: Insufficient documentation

## 2020-03-18 DIAGNOSIS — G8929 Other chronic pain: Secondary | ICD-10-CM | POA: Insufficient documentation

## 2020-03-18 DIAGNOSIS — M545 Low back pain, unspecified: Secondary | ICD-10-CM | POA: Insufficient documentation

## 2020-03-18 DIAGNOSIS — R2689 Other abnormalities of gait and mobility: Secondary | ICD-10-CM | POA: Insufficient documentation

## 2020-03-18 DIAGNOSIS — F319 Bipolar disorder, unspecified: Secondary | ICD-10-CM

## 2020-03-18 DIAGNOSIS — M25561 Pain in right knee: Secondary | ICD-10-CM | POA: Insufficient documentation

## 2020-03-18 DIAGNOSIS — M25562 Pain in left knee: Secondary | ICD-10-CM | POA: Insufficient documentation

## 2020-03-18 NOTE — Telephone Encounter (Signed)
Opened in error.   [place] Crance C Foster Sonnier, RN  

## 2020-03-18 NOTE — Telephone Encounter (Signed)
PT called and spoke with patient regarding 1st "no show" and attendance policy. Pt states she completely forgot about her appointment today. She was reminded of her next appointment 04/08/2020 and confirmed that she will attend. Pt urged to call to inquire about same day cancellations to return to PT prior to her next scheduled appointment. Pt had forgotten about her scheduled MRI for 03/20/2020 until PT reminded her. She was unsure of what location to present to for her MRI and was urged to call to confirm and ask about any details she needs to clarify.   Rhea Bleacher, PT, DPT 03/18/20 3:57 PM

## 2020-03-18 NOTE — Telephone Encounter (Signed)
Patient returns call to nurse line regarding request for anti anxiety medication for MRI. Patient is unable to remember the name of the previous medication that was prescribed. Advised patient to contact pharmacy to see if they had any records of which medication this could be, as I am not able to find anything mentioned under chart review. Patient will return call to office after talking with pharmacy.   Veronda Prude, RN

## 2020-03-19 ENCOUNTER — Other Ambulatory Visit: Payer: Self-pay

## 2020-03-19 ENCOUNTER — Ambulatory Visit (INDEPENDENT_AMBULATORY_CARE_PROVIDER_SITE_OTHER): Payer: Self-pay | Admitting: Family Medicine

## 2020-03-19 VITALS — BP 150/100 | HR 88 | Ht 64.0 in | Wt 174.1 lb

## 2020-03-19 DIAGNOSIS — M5442 Lumbago with sciatica, left side: Secondary | ICD-10-CM

## 2020-03-19 DIAGNOSIS — N3001 Acute cystitis with hematuria: Secondary | ICD-10-CM

## 2020-03-19 DIAGNOSIS — M545 Low back pain, unspecified: Secondary | ICD-10-CM

## 2020-03-19 DIAGNOSIS — R350 Frequency of micturition: Secondary | ICD-10-CM

## 2020-03-19 DIAGNOSIS — G8929 Other chronic pain: Secondary | ICD-10-CM

## 2020-03-19 DIAGNOSIS — F419 Anxiety disorder, unspecified: Secondary | ICD-10-CM

## 2020-03-19 DIAGNOSIS — B379 Candidiasis, unspecified: Secondary | ICD-10-CM

## 2020-03-19 DIAGNOSIS — T3695XA Adverse effect of unspecified systemic antibiotic, initial encounter: Secondary | ICD-10-CM

## 2020-03-19 DIAGNOSIS — M5441 Lumbago with sciatica, right side: Secondary | ICD-10-CM

## 2020-03-19 LAB — POCT URINALYSIS DIP (MANUAL ENTRY)
Bilirubin, UA: NEGATIVE
Glucose, UA: NEGATIVE mg/dL
Nitrite, UA: POSITIVE — AB
Protein Ur, POC: 100 mg/dL — AB
Spec Grav, UA: >=1.03 — AB (ref 1.010–1.025)
Urobilinogen, UA: 0.2 E.U./dL
pH, UA: 5.5 (ref 5.0–8.0)

## 2020-03-19 MED ORDER — TIZANIDINE HCL 4 MG PO TABS: 4.0000 mg | ORAL_TABLET | Freq: Four times a day (QID) | ORAL | 0 refills | Status: DC | PRN

## 2020-03-19 MED ORDER — NAPROXEN 500 MG PO TABS: 500.0000 mg | ORAL_TABLET | Freq: Two times a day (BID) | ORAL | 1 refills | Status: DC

## 2020-03-19 MED ORDER — FLUCONAZOLE 150 MG PO TABS: 150.0000 mg | ORAL_TABLET | ORAL | 0 refills | Status: AC

## 2020-03-19 MED ORDER — DIAZEPAM 5 MG PO TABS: 5.0000 mg | ORAL_TABLET | ORAL | 0 refills | Status: DC | PRN

## 2020-03-19 MED ORDER — CEPHALEXIN 500 MG PO CAPS: 500.0000 mg | ORAL_CAPSULE | Freq: Four times a day (QID) | ORAL | 0 refills | Status: AC

## 2020-03-19 MED ORDER — HYDROXYZINE PAMOATE 25 MG PO CAPS: 25.0000 mg | ORAL_CAPSULE | Freq: Every day | ORAL | 1 refills | Status: DC | PRN

## 2020-03-19 NOTE — Patient Instructions (Addendum)
It was great seeing you today!   I sent over some medication to your pharmacy for your MRI tomorrow.  For you back pain: Take 1000 mg Tylenol with one (500 mg tablet) Naprosyn tablet. You can take Tylenol 1000 mg up to 3 times a day. Naprosyn can be used twice a day. I also refilled your muscle relaxer. Be sure to get your MRI tomorrow as scheduled.   For anxiety: I refilled your hydroxyzine. Stop by the pharmacy to pick up your medication.    If your depression symptoms worsen or you have thoughts of suicide/homicide, PLEASE SEEK IMMEDIATE MEDICAL ATTENTION.  You may always call the National Suicide Hotline.  This is available 24 hours a day, 7 days a week.  Their number is: (941) [phone]  If you have questions or concerns please do not hesitate to call at [phone].  Dr. Katherina [facility]

## 2020-03-19 NOTE — Progress Notes (Signed)
SUBJECTIVE:   CHIEF COMPLAINT / HPI:   Chief Complaint  Patient presents with  . Anxiety     Kristin [name] is a 45 y.o. female here for anxiety.  Anxiety Patient will have a MR L spine tomorrow and requests anti-anxiety medications for her test. Previous imaging showed L4-L5 disc protrusion and canal narrowing.  I do think her pain ordered MR L-Spine as pt had LE weakness during her Dec 2021 visit. States she use to take Klonipin for her anxiety disorder but was switched to another medication. She has ran out of this medication.   Urinary frequency Reports she has strong smelling urine since December.  Says she was treated for UTI took all her antibiotics but felt like the symptoms came right back.  Endorses low back pain, urinary frequency and urgency.  Denies dysuria, fever, abdominal pain, pelvic pain, vaginal discharge or vaginal bleeding.  Back Pain  Reports chronic ongoing back pain.  No changes today.  No history of perianal numbness, cancer, unexplained weight loss, immunosuppression, prolonged use of steroids, history of IV drug use, urinary tract infection, pain that is increased or unrelieved by rest, fever, new bladder or bowel incontinence, significant trauma related to age.  Will be getting MRI for back pain tomorrow.   Saw Dr. Dareen Piano at [facility] for pain.     PERTINENT  PMH / PSH: reviewed and updated as appropriate   OBJECTIVE:   BP (!) 150/100   Pulse 88   Ht 5\' 4"  (1.626 m)   Wt 174 lb 2 oz (79 kg)   SpO2 98%   BMI 29.89 kg/m    GEN: Well-appearing female, in no acute distress  CV: regular rate and rhythm, no murmurs appreciated  RESP: no increased work of breathing, clear to ascultation bilaterally  BACK: No step-offs, no deformities, no midline C, T or L-spine tenderness, hypertrophic paraspinal musculature right greater than left ABD: Soft, nontender, nondistended, no CVA tenderness EXT: Strength 4/5 left, 4-/5 right, no calf tenderness SKIN: warm,  dry PSYCH: Normal affect, appropriate speech and behavior    ASSESSMENT/PLAN:   Back pain Chronic.  MRI L-spine tomorrow.  Discussed gradually returning to normal activities, as tolerated. Pt to continue ordinary activities within the limits permitted by pain. Will refill naproxen sodium and refill muscle relaxer  for relief.  Advised patient to avoid other NSAIDs while taking Naprosyn. Counseled patient on red flag symptoms and when to seek immediate care. No red flags suggesting cauda equina syndrome or progressive major motor weakness. Patient to return if symptoms do not improve with conservative treatment.   Acute cystitis UA consistent with UTI.  Urine culture pending.  Previous urine culture grew E. coli sensitive to Keflex.  On chart review it appears she was treated with Macrobid and Keflex in the past.  Will treat with Keflex 4 times daily for 5 days.  Follow-up on urine culture.  Anxiety Chronic uncontrolled.  States that hydroxyzine has helped her in the past.  Refill sent to pharmacy.  Two 5 mg Valium sent to the pharmacy for upcoming MRI.   GAD 7 : Generalized Anxiety Score 06/14/2017 03/16/2017 01/10/2017 12/26/2016  Nervous, Anxious, on Edge 3 1 3 3   Control/stop worrying 2 1 3 2   Worry too much - different things 2 1 3 3   Trouble relaxing 3 3 3 3   Restless 3 0 3 3  Easily annoyed or irritable 3 3 3 3   Afraid - awful might happen 2 0 2  3  Total GAD 7 Score 18 9 20 20   Anxiety Difficulty Somewhat difficult - - -      Antibiotic-induced yeast infection Prophylactic Diflucan sent to pharmacy.     , DO PGY-2, [facility] 03/19/2020

## 2020-03-20 ENCOUNTER — Other Ambulatory Visit: Payer: 59

## 2020-03-21 ENCOUNTER — Other Ambulatory Visit: Payer: Self-pay | Admitting: Family Medicine

## 2020-03-23 LAB — URINE CULTURE

## 2020-03-23 NOTE — Assessment & Plan Note (Addendum)
Chronic.  MRI L-spine tomorrow.  Discussed gradually returning to normal activities, as tolerated. Pt to continue ordinary activities within the limits permitted by pain. Will refill naproxen sodium and refill muscle relaxer  for relief.  Advised patient to avoid other NSAIDs while taking Naprosyn. Counseled patient on red flag symptoms and when to seek immediate care. No red flags suggesting cauda equina syndrome or progressive major motor weakness. Patient to return if symptoms do not improve with conservative treatment.

## 2020-03-23 NOTE — Assessment & Plan Note (Signed)
Prophylactic Diflucan sent to pharmacy.

## 2020-03-23 NOTE — Assessment & Plan Note (Signed)
UA consistent with UTI.  Urine culture pending.  Previous urine culture grew E. coli sensitive to Keflex.  On chart review it appears she was treated with Macrobid and Keflex in the past.  Will treat with Keflex 4 times daily for 5 days.  Follow-up on urine culture.

## 2020-03-23 NOTE — Assessment & Plan Note (Addendum)
Chronic uncontrolled.  States that hydroxyzine has helped her in the past.  Refill sent to pharmacy.  Two 5 mg Valium sent to the pharmacy for upcoming MRI.   GAD 7 : Generalized Anxiety Score 06/14/2017 03/16/2017 01/10/2017 12/26/2016  Nervous, Anxious, on Edge 3 1 3 3   Control/stop worrying 2 1 3 2   Worry too much - different things 2 1 3 3   Trouble relaxing 3 3 3 3   Restless 3 0 3 3  Easily annoyed or irritable 3 3 3 3   Afraid - awful might happen 2 0 2 3  Total GAD 7 Score 18 9 20 20   Anxiety Difficulty Somewhat difficult - - -

## 2020-03-26 ENCOUNTER — Other Ambulatory Visit: Payer: Self-pay | Admitting: Family Medicine

## 2020-03-26 DIAGNOSIS — M5442 Lumbago with sciatica, left side: Secondary | ICD-10-CM

## 2020-03-26 DIAGNOSIS — G8929 Other chronic pain: Secondary | ICD-10-CM

## 2020-04-06 ENCOUNTER — Ambulatory Visit: Payer: 59 | Admitting: Physical Therapy

## 2020-04-08 ENCOUNTER — Other Ambulatory Visit: Payer: Self-pay

## 2020-04-08 ENCOUNTER — Ambulatory Visit: Payer: Self-pay

## 2020-04-08 DIAGNOSIS — M6281 Muscle weakness (generalized): Secondary | ICD-10-CM

## 2020-04-08 DIAGNOSIS — G8929 Other chronic pain: Secondary | ICD-10-CM

## 2020-04-08 DIAGNOSIS — R2689 Other abnormalities of gait and mobility: Secondary | ICD-10-CM

## 2020-04-08 DIAGNOSIS — M545 Low back pain, unspecified: Secondary | ICD-10-CM

## 2020-04-08 NOTE — Therapy (Addendum)
[place], [place], [address] Phone: [phone]   Fax:  [phone]  Physical Therapy Treatment/Discharge  Patient Details  Name: Kristin [name] MRN: [id number] Date of Birth: June 24, 1975 Referring Provider (PT): Lind Covert, MD   Encounter Date: 04/08/2020   PT End of Session - 04/08/20 1534    Visit Number 2    Number of Visits 6    Date for PT Re-Evaluation 04/22/20    Authorization Type [employer]    PT Start Time 0258   pt arrived late   PT Stop Time 1613    PT Time Calculation (min) 38 min    Activity Tolerance Patient limited by pain;Patient tolerated treatment well   slow pace due to pain   Behavior During Therapy [facility] for tasks assessed/performed           Past Medical History:  Diagnosis Date  . Acid reflux   . ACL tear 2011   not sure which side  . Acute lateral meniscus tear of right knee   . Anxiety   . Arthritis   . Back pain    slipped disc lower back lumbar  . Bipolar 1 disorder ([place])   . Depression   . Dysfunctional uterine bleeding   . Eczema   . Hypertension   . Insomnia   . Migraine headache   . Neuromuscular disorder ([place])    leg cramps    Past Surgical History:  Procedure Laterality Date  . BIOPSY  09/19/2017   Procedure: BIOPSY;  Surgeon: Otis Brace, MD;  Location: [facility];  Service: Gastroenterology;;  . BIOPSY  10/15/2019   Procedure: BIOPSY;  Surgeon: Wilford Corner, MD;  Location: [facility];  Service: Endoscopy;;  . COLONOSCOPY N/A 10/15/2019   Procedure: COLONOSCOPY;  Surgeon: Wilford Corner, MD;  Location: [facility];  Service: Endoscopy;  Laterality: N/A;  . DILITATION & CURRETTAGE/HYSTROSCOPY WITH HYDROTHERMAL ABLATION N/A 04/04/2017   Procedure: DILATATION & CURETTAGE/HYSTEROSCOPY WITH HYDROTHERMAL ABLATION;  Surgeon: Donnamae Jude, MD;  Location: [facility];  Service: Gynecology;  Laterality: N/A;  .  ESOPHAGOGASTRODUODENOSCOPY N/A 10/15/2019   Procedure: ESOPHAGOGASTRODUODENOSCOPY (EGD);  Surgeon: Wilford Corner, MD;  Location: [facility];  Service: Endoscopy;  Laterality: N/A;  . ESOPHAGOGASTRODUODENOSCOPY (EGD) WITH PROPOFOL N/A 09/19/2017   Procedure: ESOPHAGOGASTRODUODENOSCOPY (EGD) WITH PROPOFOL;  Surgeon: Otis Brace, MD;  Location: [facility];  Service: Gastroenterology;  Laterality: N/A;  . KNEE ARTHROSCOPY WITH LATERAL MENISECTOMY Right 09/30/2019   Procedure: KNEE ARTHROSCOPY WITH LATERAL MENISECTOMY;  Surgeon: Renette Butters, MD;  Location: [facility];  Service: Orthopedics;  Laterality: Right;  . TUBAL LIGATION  yrs ago    There were no vitals filed for this visit.   Subjective Assessment - 04/08/20 1535    Subjective "It's been bad. I fell the other day getting out of my car, and someone had to help me up. It hurts 10/10 all day everyday." Pt reports continued use of cane as needed due to legs giving out. "Something was going on with my insurance, so I had to reschedule my MRI."    Limitations Sitting;Lifting;Standing;Walking;House hold activities    How long can you sit comfortably? 30 minutes    How long can you stand comfortably? 30 minutes    How long can you walk comfortably? "no good at walking"    Diagnostic tests Previous MRI in 2018, updated MRI scheduled for 03/20/2020    Patient Stated Goals Get pain better  Currently in Pain? Yes    Pain Score 10-Worst pain ever    Pain Location Generalized   all way in the back and on down to my legs   Pain Orientation Right;Left;Lower    Pain Descriptors / Indicators Tightness;Sharp;Shooting;Numbness    Pain Type Chronic pain    Pain Radiating Towards back down to feet    Pain Onset More than a month ago    Pain Frequency Constant              [facility] PT Assessment - 04/08/20 0001      Assessment   Medical Diagnosis Weakness of both lower extremities    Referring Provider (PT) Lind Covert, MD      Palpation   Palpation comment TTP along lumbar paraspinals and QL      Special Tests   Other special tests (-) Long sit test                         [facility] Adult PT Treatment/Exercise - 04/08/20 0001      Lumbar Exercises: Stretches   Passive Hamstring Stretch Right;Left;1 rep;30 seconds    Passive Hamstring Stretch Limitations seated at EOM    Other Lumbar Stretch Exercise Seated forward and lateral flexion with green swiss ball 5 x 5 sec    Other Lumbar Stretch Exercise Brief Thomas stretch for R hip FL but pt with difficulty maintaining low back against mat and reports discomfort in low back      Lumbar Exercises: Aerobic   Nustep L3 x 5 min LE only      Lumbar Exercises: Supine   Pelvic Tilt 15 reps    Bent Knee Raise Limitations Alternating marches x 15 each LE    Bridge with [employer] 20 reps    Bridge with [employer] Limitations 2 x 10      Knee/Hip Exercises: Seated   Long Arc Quad Strengthening;Both;2 sets;10 reps      Manual Therapy   Manual Therapy Soft tissue mobilization;Myofascial release    Soft tissue mobilization STM and myofascial release within tolerance along lumbar paraspinals and QL bilaterally                  PT Education - 04/08/20 1730    Education Details Reviewed HEP; rationale and cues during interventions    Person(s) Educated Patient    Methods Explanation;Demonstration;Tactile cues;Verbal cues    Comprehension Verbalized understanding;Returned demonstration;Verbal cues required;Tactile cues required;Need further instruction            PT Short Term Goals - 04/08/20 1618      PT SHORT TERM GOAL #1   Title Patient will be I with initial HEP to progress with PT    Time 3    Period Weeks    Status On-going    Target Date 04/01/20      PT SHORT TERM GOAL #2   Title PT will review FOTO with patient by 3rd visit    Time 3    Period Weeks    Status On-going    Target Date 04/01/20              PT Long Term Goals - 03/11/20 1803      PT LONG TERM GOAL #1   Title Patient will be I with final HEP to maintain progress from PT    Time 6    Period Weeks    Status New  Target Date 04/22/20      PT LONG TERM GOAL #2   Title Patient will report </= 6/10 pain level with work related tasks to reduce functional limitations    Time 6    Period Weeks    Status New    Target Date 04/22/20      PT LONG TERM GOAL #3   Title Patient will be able to walk 30 minutes without limitation to improve community access and work    Time 6    Period Weeks    Status New    Target Date 04/22/20      PT LONG TERM GOAL #4   Title Patient will report improved functional status >/= 53% on FOTO    Time 6    Period Weeks    Status New    Target Date 04/22/20                 Plan - 04/08/20 1557    Clinical Impression Statement Patient was able to perform all interventions with no adverse effects or complaints of increased pain from initial 10/10 report. She presents with TTP and myofascial trigger points along lumbar paraspinals and QL. Pt performed exercises at very slow pace due to pain. She states that her symptoms are the same at end of session but that today is a "good day" compared to most days. She expresses she knows she will be sore this evening after doing stretches and exercises.    Personal Factors and Comorbidities Fitness;Past/Current Experience;Social Background;Time since onset of injury/illness/exacerbation;Comorbidity 1    Comorbidities Anxiety    Examination-Activity Limitations Locomotion Level;Lift;Stand;Stairs;Squat;Sleep;Sit;Bend    Examination-Participation Restrictions Meal Prep;Cleaning;Occupation;Community Activity;Shop;Laundry;Yard Work    [employer]chant [employer] officer Evolving/Moderate complexity    Rehab Potential Fair    PT Frequency 1x / week    PT Duration 6 weeks    PT Treatment/Interventions ADLs/Self Care Home  Management;Cryotherapy;Electrical Stimulation;Iontophoresis 71m/ml Dexamethasone;Moist Heat;Traction;Ultrasound;Neuromuscular re-education;Balance training;Therapeutic exercise;Therapeutic activities;Functional mobility training;Stair training;Gait training;Manual techniques;Patient/family education;Dry needling;Passive range of motion;Taping;Spinal Manipulations;Joint Manipulations    PT Next Visit Plan Review FOTO. Review HEP and progress PRN, manual/modalities for pain relief, gradual LE strengthening and stretching, core activation and lumbar mobilit. Provide tennis ball for self myofascial release and STM.    PT Home Exercise Plan [id number]    Consulted and Agree with Plan of Care Patient           Patient will benefit from skilled therapeutic intervention in order to improve the following deficits and impairments:  Abnormal gait,Decreased range of motion,Difficulty walking,Decreased activity tolerance,Pain,Postural dysfunction,Decreased strength,Impaired sensation,Decreased balance,Impaired flexibility  Visit Diagnosis: Chronic bilateral low back pain, unspecified whether sciatica present  Chronic pain of left knee  Chronic pain of right knee  Muscle weakness (generalized)  Other abnormalities of gait and mobility     Problem List Patient Active Problem List   Diagnosis Date Noted  . Lower extremity weakness 02/15/2020  . Antibiotic-induced yeast infection 02/15/2020  . Anemia   . Syncope 10/10/2019  . UTI (urinary tract infection) 08/06/2019  . Frequent urination 07/29/2019  . Anxiety 07/07/2019  . Suicidal ideation 08/08/2018  . Vaginal irritation 03/07/2018  . Acute cystitis 11/09/2017  . Injury of toe on right foot 10/16/2017  . Esophagitis determined by endoscopy   . Dehydration   . Nausea & vomiting 09/18/2017  . AKI (acute kidney injury) ([place]   . Bipolar disorder in remission ([place]   . Back pain 08/25/2016  . Generalized anxiety disorder 08/23/2015  .  Osteoarthritis of right knee 12/21/2014  . Dysuria 04/22/2014  . Vaginal discharge 04/22/2014  . Tinea pedis of both feet 10/02/2013  . Menorrhagia 09/04/2012  . Allergic rhinitis 06/12/2012  . Eczema 03/26/2012  . Alcohol abuse 10/26/2010  . Bipolar 1 disorder ([place]) 10/09/2010  . Panic attacks 10/09/2010  . INSOMNIA, CHRONIC 06/29/2009  . TOBACCO USER 01/23/2009  . Obesity 10/29/2008  . Essential hypertension 12/20/2006   PHYSICAL THERAPY DISCHARGE SUMMARY  Visits from Start of Care: 2  Current functional level related to goals / functional outcomes: See above   Remaining deficits: See above   Education / Equipment: See above  Plan: Patient agrees to discharge.  Patient goals were not met. Patient is being discharged due to not returning since the last visit.  ?????     Haydee Monica, PT, DPT 05/13/20 11:27 AM  [facility] [address] [place], [place], [address] Phone: [phone]   Fax:  [phone]  Name: Kristin [name] MRN: [id number] Date of Birth: 1975/10/20

## 2020-04-15 ENCOUNTER — Other Ambulatory Visit: Payer: Self-pay

## 2020-04-15 ENCOUNTER — Ambulatory Visit: Payer: Medicaid Other

## 2020-04-15 ENCOUNTER — Ambulatory Visit: Payer: 59 | Admitting: Physical Therapy

## 2020-04-15 ENCOUNTER — Ambulatory Visit (INDEPENDENT_AMBULATORY_CARE_PROVIDER_SITE_OTHER): Payer: 59

## 2020-04-15 DIAGNOSIS — Z23 Encounter for immunization: Secondary | ICD-10-CM

## 2020-04-15 NOTE — Progress Notes (Unsigned)
   Covid-19 Vaccination Clinic  Name:  Kristin [name]    MRN: [id number] DOB: 07/10/1975  04/15/2020   Patient presents to nurse clinic for second COVID vaccination. Administered in LD, site unremarkable, tolerated injection well.   Kristin [name] was observed post Covid-19 immunization for 30 minutes based on pre-vaccination screening without incident. She was provided with Vaccine Information Sheet and instruction to access the V-Safe system.   Kristin [name] was instructed to call 911 with any severe reactions post vaccine: Marland Kitchen Difficulty breathing  . Swelling of face and throat  . A fast heartbeat  . A bad rash all over body  . Dizziness and weakness   Patient reports that after first dose of COVID vaccine that she experienced worsening of migraine headaches. Precepted with Dr. Manson Passey, who advised patient to call office if headaches worsen after second dose for further medication management by PCP.   Provided patient with updated immunization card and record.   Veronda Prude, RN

## 2020-04-16 ENCOUNTER — Telehealth: Payer: Self-pay

## 2020-04-16 DIAGNOSIS — M545 Low back pain, unspecified: Secondary | ICD-10-CM

## 2020-04-16 DIAGNOSIS — G8929 Other chronic pain: Secondary | ICD-10-CM

## 2020-04-16 MED ORDER — NAPROXEN 500 MG PO TABS: 500.0000 mg | ORAL_TABLET | Freq: Two times a day (BID) | ORAL | 1 refills | Status: DC

## 2020-04-16 NOTE — Telephone Encounter (Signed)
Patient calls nurse line regarding increased pain related to migraine headaches since second COVID vaccine yesterday. Patient states that pain is radiating down neck and reports nausea and light sensitivity.  Patient is requesting migraine medication to be sent to pharmacy.   Offered to schedule patient appointment to further discuss migraines. Patient declined, stating that she cannot keep missing work to come into office.   To PCP  Please advise.   Veronda Prude, RN

## 2020-04-16 NOTE — Telephone Encounter (Signed)
Called patient.  She reports that this migraine his been going on since she received the shot yesterday but that she has had migraines before and she would like a medication for these migraines.  She reports that she has an allergy to one of the medication she was tried on in the past which to chart review I thought was sumatriptan.  I refilled her naproxen and scheduled her for an appointment on 2/8.  Strict ED precautions given.

## 2020-04-20 ENCOUNTER — Encounter: Payer: Self-pay | Admitting: Family Medicine

## 2020-04-20 ENCOUNTER — Ambulatory Visit (INDEPENDENT_AMBULATORY_CARE_PROVIDER_SITE_OTHER): Payer: 59 | Admitting: Family Medicine

## 2020-04-20 ENCOUNTER — Other Ambulatory Visit: Payer: Self-pay | Admitting: Family Medicine

## 2020-04-20 ENCOUNTER — Other Ambulatory Visit: Payer: Self-pay

## 2020-04-20 VITALS — BP 124/90 | HR 88 | Ht 64.0 in | Wt 173.0 lb

## 2020-04-20 DIAGNOSIS — G8929 Other chronic pain: Secondary | ICD-10-CM

## 2020-04-20 DIAGNOSIS — G43909 Migraine, unspecified, not intractable, without status migrainosus: Secondary | ICD-10-CM | POA: Insufficient documentation

## 2020-04-20 DIAGNOSIS — G43809 Other migraine, not intractable, without status migrainosus: Secondary | ICD-10-CM

## 2020-04-20 DIAGNOSIS — R350 Frequency of micturition: Secondary | ICD-10-CM | POA: Diagnosis not present

## 2020-04-20 DIAGNOSIS — N390 Urinary tract infection, site not specified: Secondary | ICD-10-CM

## 2020-04-20 DIAGNOSIS — F319 Bipolar disorder, unspecified: Secondary | ICD-10-CM | POA: Diagnosis not present

## 2020-04-20 LAB — POCT UA - MICROSCOPIC ONLY
Trichomonas, UA: POSITIVE
WBC, Ur, HPF, POC: >20 (ref 0–5)

## 2020-04-20 LAB — POCT URINALYSIS DIP (MANUAL ENTRY)
Bilirubin, UA: NEGATIVE
Glucose, UA: NEGATIVE mg/dL
Ketones, POC UA: NEGATIVE mg/dL
Nitrite, UA: POSITIVE — AB
Spec Grav, UA: 1.025 (ref 1.010–1.025)
Urobilinogen, UA: 0.2 E.U./dL
pH, UA: 5.5 (ref 5.0–8.0)

## 2020-04-20 MED ORDER — FLUCONAZOLE 150 MG PO TABS: 150.0000 mg | ORAL_TABLET | Freq: Once | ORAL | 0 refills | Status: AC

## 2020-04-20 MED ORDER — NITROFURANTOIN MONOHYD MACRO 100 MG PO CAPS: 100.0000 mg | ORAL_CAPSULE | Freq: Two times a day (BID) | ORAL | 0 refills | Status: DC

## 2020-04-20 NOTE — Patient Instructions (Addendum)
It was great seeing you today.  Regarding your headaches I want you to pick up the naproxen and see if that helps.  There are some other medications we can start you on but I would like to hold off right now.  For your concerns for UTI we have collected a urine specimen and the culture.  I will call with those results and call in whichever antibiotics needed.  Regarding your treatment for bipolar I have sent a referral in for psychiatry.  Someone should be contacting you to schedule an appointment.  Below are some other psychiatric resources in the area.  If you have any thoughts of hurting yourself please reach out or call one of the locations listed below.  If you have any questions or concerns please feel free to call the clinic.  I hope you have a wonderful afternoon!  If you are feeling suicidal or depression symptoms worsen please immediately go to:   24 Hour Availability [facility]  [address] [place], [place] [phone] Crisis [phone]    . If you are thinking about harming yourself or having thoughts of suicide, or if you know someone who is, seek help right away. . Call your doctor or mental health care provider. . Call 911 or go to a hospital emergency room to get immediate help, or ask a friend or family member to help you do these things. . Call the [place] National Suicide Prevention Lifeline's toll-free, 24-hour hotline at 1-800-273-TALK 601-698-4700) or TTY: 1-800-799-4 TTY 516 445 3448) to talk to a trained counselor. . If you are in crisis, make sure you are not left alone.  . If someone else is in crisis, make sure he or she is not left alone   [facility] of the [employer] (Domestic Violence, Rape & Victim Assistance [phone]  [facility] [employer] Crisis Services    (ONLY from 8am-4pm)    [phone]  [facility] Mobile Crisis Unit (24/7)   [phone]  [place] National Suicide Hotline    (626) 880-4899 Len Childs)   Outpatient Mental Health Providers (No Insurance required or Self Pay)  [facility]  [address] [place], [place] [phone] Crisis [phone]  [facility]) can see uninsured folks for outpatient therapy [email or web address]-[email or web address]/ [address] [place], [place] [address] [phone]  [facility]    Walk-in Mon-Fri, 8am-3pm [email or web address] Scull [address] [place], [place]  [phone]   [address]  [place] [phone] [facility] for psych med management, there may be a wait- if [facility] is working with clients for OPT, they will coordinate with [facility] for psych  [facility]   Walk-in-Clinic: Monday- Friday 9:00 AM - 4:00 PM [address]   [place], [place] [phone]  Family Services of the [place] ([employer]) walk in M-F 8am-12pm and  1pm-3pm [place]- [address]     [phone]  [employer] -[address]  Phone: [phone]  [employer] (Mental Health and substance challenges) [address]   [place] [phone]    [email or web address]    [facility]  [place] -[address], [place]     Phone:  [phone] [place]-  [address]  [phone]   [facility]  [address] [place]  [phone] PrepaidHoliday.[email or web address]   [facility] ( virtual or zoom therapy) [email or web address]  [address] [place]  [phone]    [facility] [phone]  grief counseling, dementia and caregiver support    [facility] Walk-in MWF 12:30 to 3:00     [address] [place] [address]  [phone]  www.[email or web address] call to schedule an appointment    Mental Health [facility] Classes ,Support group, Peer support services, [address], [place],  [place] [address] [phone]  PhotoSolver.pl           National Alliance on Mental Illness (NAMI) [place]- Wellness classes, Support groups        [address], [place], [place] [address] [phone]   [email or web address]   [facility]  (Psycho-social Rehabilitation clubhouse, Individual and group therapy) [address] [place], [place] [address]   336- [phone]  24- Hour Availability:  [facility] [phone] or [phone] * Family Service of the [employer] (Domestic Violence, Rape, etc. )256-109-4889 Vesta Mixer [phone] or [phone] * [facility] [place] [facility] [phone] only) [phone] (after hours) *[facility] Mobile Crisis Unit [phone] *[place] National Suicide Hotline 939-147-4319 Len Childs)

## 2020-04-20 NOTE — Assessment & Plan Note (Signed)
Urinalysis consistent with a UTI.  She recently had a UTI with urine cultures positive for E. coli in January 2022.  Susceptibilities showed it was susceptible to the Keflex which she was treated with previously.  Also susceptible to Macrobid. -Prescription sent for Macrobid twice daily for 5 days -Prescription sent for Diflucan for the yeast infection that patient reports she gets every time she takes antibiotics. -Strict ED and return precautions given

## 2020-04-20 NOTE — Progress Notes (Signed)
SUBJECTIVE:   CHIEF COMPLAINT / HPI:   Migraine concerns Patient reports longstanding history of migraines.  She says that they happened irregularly before she received the COVID-19 vaccine last week but that since then they have been happening constantly, especially at night before she goes to bed.  She reports the pain starts in her neck and then goes up to the her head bilaterally.  Reports photophobia as well as nausea.  Has not been able to pick up her prescription for naproxen which she says helps typically when she has headaches or migraines.  Urine frequency Patient reports recent history of UTI which she took Keflex for 5 days for.  She reports that the symptoms resolved but she is now having urinary frequency again.  She feels like she has another UTI.  She would like treatment for this and also for the yeast infection that typically follows after antibiotics.  Depression and anxiety Patient reports that she has been having considerable issues with depression and anxiety.  She was on a number of medications for bipolar disorder but has not pick up the prescriptions because she did not have insurance and now that she has them parents she does not know what medication she should be on.  She reports that she initially followed with [facility] but would like to try someone else.  Reports that regarding her anxiety hydroxyzine works in the past.  We looked through her chart and it appears that a prescription has been sent to the pharmacy and she is just not picked it up.  She denies any SI or HI at this time although she reports passive SI in the recent past.  Denies any plan or thoughts of harming herself at this time.  Reports she has too much to live for to hurt her self.  OBJECTIVE:   BP 124/90   Pulse 88   Ht 5\' 4"  (1.626 m)   Wt 173 lb (78.5 kg)   SpO2 96%   BMI 29.70 kg/m   General: Well-appearing 45 year old female in no acute distress Cardiac: Regular rate and  rhythm, no murmurs appreciated Respiratory: Normal breathing, lungs clear to auscultation bilaterally Neuro: No gross cranial nerve defects, pupils equal and reactive to light, extraocular movement intact Psych: Predominantly in a good mood, becomes tearful when talking about depression and thoughts of self-harm but denies any SI or HI at this time.  Reports passive SI in the recent past due to the loss of her brother and taking care of her mother.  She reports that she is prescribed medications for this but has not picked up any of the prescriptions or followed up with psychiatry and significant amount of time.  ASSESSMENT/PLAN:   Bipolar 1 disorder Patient reports that she has not been on her medications for bipolar in a significant amount of time.  I sent a refill on her Zyprexa to the pharmacy in January but she did not pick it up because she did not have insurance at the time.  She would like to follow-up with psychiatry. -Referral placed for psychiatry -Patient has refills on her prescriptions for Zyprexa as well as hydroxyzine at the pharmacy, patient can call to get refills as needed -Strict ED and return precautions given   UTI (urinary tract infection) Urinalysis consistent with a UTI.  She recently had a UTI with urine cultures positive for E. coli in January 2022.  Susceptibilities showed it was susceptible to the Keflex which she was treated with  previously.  Also susceptible to Macrobid. -Prescription sent for Macrobid twice daily for 5 days -Prescription sent for Diflucan for the yeast infection that patient reports she gets every time she takes antibiotics. -Strict ED and return precautions given  Migraine headache Patient reports headaches predominantly night with photophobia and nausea.  Patient reports that the pain starts in her neck and moves up to her head.  She reports that it has become more frequent since receiving her second dose of the Covid vaccine 1 week ago.  Has  not picked up prescription for naproxen.  Physical exam reassuring with no gross neuro defects -Prescription for naproxen at the pharmacy for patient to pick up -Strict ED return precautions given -May consider prophylactic medication such as mag ox which she has been on in the past but will hold off at this time.     Derrel Nip, MD [facility]

## 2020-04-20 NOTE — Assessment & Plan Note (Signed)
Patient reports headaches predominantly night with photophobia and nausea.  Patient reports that the pain starts in her neck and moves up to her head.  She reports that it has become more frequent since receiving her second dose of the Covid vaccine 1 week ago.  Has not picked up prescription for naproxen.  Physical exam reassuring with no gross neuro defects -Prescription for naproxen at the pharmacy for patient to pick up -Strict ED return precautions given -May consider prophylactic medication such as mag ox which she has been on in the past but will hold off at this time.

## 2020-04-20 NOTE — Assessment & Plan Note (Addendum)
Patient reports that she has not been on her medications for bipolar in a significant amount of time.  I sent a refill on her Zyprexa to the pharmacy in January but she did not pick it up because she did not have insurance at the time.  She would like to follow-up with psychiatry. -Referral placed for psychiatry -Patient has refills on her prescriptions for Zyprexa as well as hydroxyzine at the pharmacy, patient can call to get refills as needed -Strict ED and return precautions given

## 2020-04-22 ENCOUNTER — Ambulatory Visit: Payer: 59 | Attending: Family Medicine | Admitting: Physical Therapy

## 2020-04-22 ENCOUNTER — Telehealth: Payer: Self-pay | Admitting: Family Medicine

## 2020-04-22 ENCOUNTER — Telehealth: Payer: Self-pay | Admitting: Physical Therapy

## 2020-04-22 LAB — URINE CULTURE

## 2020-04-22 MED ORDER — METRONIDAZOLE 500 MG PO TABS: 500.0000 mg | ORAL_TABLET | Freq: Two times a day (BID) | ORAL | 0 refills | Status: DC

## 2020-04-22 NOTE — Telephone Encounter (Signed)
Called patient to inform her of culture and micro results. Reports she has not been sexually active for several months and says that she had Trich last summer and was treated for it. Prescription sent to patients pharmacy for metronidazole 500 mg BID. Informed that her partner should also be treated. Patient understands instructions.

## 2020-04-22 NOTE — Telephone Encounter (Signed)
Attempted to contact patient regarding missed PT appointment. Left VM informing patient of missed appointment, this was her last scheduled appointment so instructed patient to contact PT office to schedule any future visits. Per attendance policy this is patient's 2nd no-show so will only be able to schedule 1 visit at a time.   Rosana Hoes, PT, DPT, LAT, ATC 04/22/20  4:13 PM Phone: [phone] Fax: [phone]

## 2020-05-03 ENCOUNTER — Other Ambulatory Visit: Payer: Self-pay | Admitting: *Deleted

## 2020-05-03 DIAGNOSIS — M5442 Lumbago with sciatica, left side: Secondary | ICD-10-CM

## 2020-05-03 DIAGNOSIS — G8929 Other chronic pain: Secondary | ICD-10-CM

## 2020-05-03 MED ORDER — TIZANIDINE HCL 4 MG PO TABS: ORAL_TABLET | ORAL | 0 refills | Status: DC

## 2020-05-08 ENCOUNTER — Ambulatory Visit
Admission: RE | Admit: 2020-05-08 | Discharge: 2020-05-08 | Disposition: A | Discharge status: Home / Self Care | Payer: 59 | Source: Ambulatory Visit | Attending: Family Medicine | Admitting: Family Medicine

## 2020-05-08 ENCOUNTER — Other Ambulatory Visit: Payer: Self-pay

## 2020-05-08 DIAGNOSIS — R32 Unspecified urinary incontinence: Secondary | ICD-10-CM

## 2020-05-08 DIAGNOSIS — R29898 Other symptoms and signs involving the musculoskeletal system: Secondary | ICD-10-CM

## 2020-05-13 ENCOUNTER — Ambulatory Visit: Payer: 59 | Admitting: Family Medicine

## 2020-05-13 ENCOUNTER — Other Ambulatory Visit: Payer: Self-pay

## 2020-05-13 ENCOUNTER — Encounter: Payer: Self-pay | Admitting: Family Medicine

## 2020-05-13 VITALS — BP 138/104 | HR 114 | Ht 64.0 in | Wt 171.4 lb

## 2020-05-13 DIAGNOSIS — M1731 Unilateral post-traumatic osteoarthritis, right knee: Secondary | ICD-10-CM | POA: Diagnosis not present

## 2020-05-13 DIAGNOSIS — G8929 Other chronic pain: Secondary | ICD-10-CM

## 2020-05-13 DIAGNOSIS — R350 Frequency of micturition: Secondary | ICD-10-CM

## 2020-05-13 DIAGNOSIS — M5442 Lumbago with sciatica, left side: Secondary | ICD-10-CM | POA: Diagnosis not present

## 2020-05-13 DIAGNOSIS — M5441 Lumbago with sciatica, right side: Secondary | ICD-10-CM

## 2020-05-13 DIAGNOSIS — N390 Urinary tract infection, site not specified: Secondary | ICD-10-CM

## 2020-05-13 LAB — POCT URINALYSIS DIP (MANUAL ENTRY)
Blood, UA: NEGATIVE
Glucose, UA: NEGATIVE mg/dL
Nitrite, UA: POSITIVE — AB
Protein Ur, POC: 30 mg/dL — AB
Spec Grav, UA: 1.025 (ref 1.010–1.025)
Urobilinogen, UA: 0.2 E.U./dL
pH, UA: 5 (ref 5.0–8.0)

## 2020-05-13 LAB — POCT UA - MICROSCOPIC ONLY

## 2020-05-13 MED ORDER — KETOROLAC TROMETHAMINE 30 MG/ML IJ SOLN
30.0000 mg | Freq: Once | INTRAMUSCULAR | Status: AC
  Administered 2020-05-13: 30 mg via INTRAMUSCULAR

## 2020-05-13 MED ORDER — CEPHALEXIN 500 MG PO CAPS: 500.0000 mg | ORAL_CAPSULE | Freq: Four times a day (QID) | ORAL | 0 refills | Status: AC

## 2020-05-13 NOTE — Progress Notes (Signed)
    SUBJECTIVE:   CHIEF COMPLAINT / HPI:   Recurrent uti: Patient recently treated for UTI and trichomonas.  Approximately 3 weeks ago.  Still having dark urine with strong odor and recent urinary frequency with variable urine volume.  Patient has had multiple UTIs in the past 12 months.  Knee pain: Patient states she has feelings of numbness below the knee.  This is a chronic issue with her.  Has significant amount of chronic knee pain.  Had urinary incontinence last summer but not currently.  Had a knee arthroscopy at [facility] last summer.  Has not gone back since that time.  Has tried Biofreeze, Advil, Tylenol.  States "nothing works".  Has tried knee injections as well before.   PERTINENT  PMH / PSH: Chronic knee pain, recurrent UTI  OBJECTIVE:   BP (!) 138/104   Pulse (!) 114   Ht 5\' 4"  (1.626 m)   Wt 171 lb 6.4 oz (77.7 kg)   SpO2 96%   BMI 29.42 kg/m   General: Alert and oriented.  Mild amount of discomfort. CV: Regular rate and rhythm, no murmurs Pulmonary: Lungs clear auscultation bilaterally. MSK: Tenderness over the knees bilaterally, right greater than left.  No significant asymmetry between left and right knee or calf.  Limited range of motion due to pain.  Antalgic gait.  ASSESSMENT/PLAN:   Recurrent UTI Again with urinalysis indicative of UTI.  Will send off urine culture.  We will treat with Keflex x10 days this time.  Recent culture starting to show resistance to multiple antibiotics.  Will refer to urology for further work-up and treatment of causes of recurrent UTI.  Osteoarthritis of right knee Chronic issue.  Not worsening, but only responsive to all treatment modalities so far.  Offered patient Toradol shot clinic which she accepted.  Advised her to follow-up with her orthopedist, at [facility].     [facility], MD [facility]

## 2020-05-13 NOTE — Patient Instructions (Signed)
It was nice to see you today,  I have prescribed you Keflex for your UTI.  Your last urinalysis and urine culture showed resistance to multiple antibiotics.  I think because of this and your frequent UTIs you should see a urologist.  I will put in the referral and somebody should call you in the next week.  If you have not heard from somebody in 1 week please call us.  For your back pain I believe you should talk with the orthopedist at [facility].  Just call their office and schedule an appointment with them.  They may be able to offer other treatments we cannot offer here.  Please follow-up with your PCP, Dr. Foster Simpson, in the next month regarding follow-up of these issues.  Have a great day,  Frederic Jericho, MD

## 2020-05-14 NOTE — Assessment & Plan Note (Signed)
Chronic issue.  Not worsening, but only responsive to all treatment modalities so far.  Offered patient Toradol shot clinic which she accepted.  Advised her to follow-up with her orthopedist, at [facility].

## 2020-05-14 NOTE — Assessment & Plan Note (Signed)
Again with urinalysis indicative of UTI.  Will send off urine culture.  We will treat with Keflex x10 days this time.  Recent culture starting to show resistance to multiple antibiotics.  Will refer to urology for further work-up and treatment of causes of recurrent UTI.

## 2020-05-17 NOTE — Progress Notes (Incomplete)
[place]   [phone] [phone]   Clinic Follow up Note   Patient Care Team: Gifford Shave, MD as PCP - General (Family Medicine)  Date of Service:  05/17/2020  CHIEF COMPLAINT: F/u of anemia    CURRENT THERAPY:  Folic acid $RemoveBe'1mg'[id number]$  daily   INTERVAL HISTORY: *** Kristin [name] is here for a follow up of anemia. She was last seen by me 6 months ago. She presents to the clinic alone.    REVIEW OF SYSTEMS:  *** Constitutional: Denies fevers, chills or abnormal weight loss Eyes: Denies blurriness of vision Ears, nose, mouth, throat, and face: Denies mucositis or sore throat Respiratory: Denies cough, dyspnea or wheezes Cardiovascular: Denies palpitation, chest discomfort or lower extremity swelling Gastrointestinal:  Denies nausea, heartburn or change in bowel habits Skin: Denies abnormal skin rashes Lymphatics: Denies new lymphadenopathy or easy bruising Neurological:Denies numbness, tingling or new weaknesses Behavioral/Psych: Mood is stable, no new changes  All other systems were reviewed with the patient and are negative.  MEDICAL HISTORY:  Past Medical History:  Diagnosis Date  . Acid reflux   . ACL tear 2011   not sure which side  . Acute lateral meniscus tear of right knee   . Anxiety   . Arthritis   . Back pain    slipped disc lower back lumbar  . Bipolar 1 disorder ([place])   . Depression   . Dysfunctional uterine bleeding   . Eczema   . Hypertension   . Insomnia   . Migraine headache   . Neuromuscular disorder ([place])    leg cramps    SURGICAL HISTORY: Past Surgical History:  Procedure Laterality Date  . BIOPSY  09/19/2017   Procedure: BIOPSY;  Surgeon: Otis Brace, MD;  Location: [facility];  Service: Gastroenterology;;  . BIOPSY  10/15/2019   Procedure: BIOPSY;  Surgeon: Wilford Corner, MD;  Location: [facility];  Service: Endoscopy;;  . COLONOSCOPY N/A 10/15/2019   Procedure: COLONOSCOPY;  Surgeon:  Wilford Corner, MD;  Location: [facility];  Service: Endoscopy;  Laterality: N/A;  . DILITATION & CURRETTAGE/HYSTROSCOPY WITH HYDROTHERMAL ABLATION N/A 04/04/2017   Procedure: DILATATION & CURETTAGE/HYSTEROSCOPY WITH HYDROTHERMAL ABLATION;  Surgeon: Donnamae Jude, MD;  Location: [place];  Service: Gynecology;  Laterality: N/A;  . ESOPHAGOGASTRODUODENOSCOPY N/A 10/15/2019   Procedure: ESOPHAGOGASTRODUODENOSCOPY (EGD);  Surgeon: Wilford Corner, MD;  Location: [facility];  Service: Endoscopy;  Laterality: N/A;  . ESOPHAGOGASTRODUODENOSCOPY (EGD) WITH PROPOFOL N/A 09/19/2017   Procedure: ESOPHAGOGASTRODUODENOSCOPY (EGD) WITH PROPOFOL;  Surgeon: Otis Brace, MD;  Location: [facility];  Service: Gastroenterology;  Laterality: N/A;  . KNEE ARTHROSCOPY WITH LATERAL MENISECTOMY Right 09/30/2019   Procedure: KNEE ARTHROSCOPY WITH LATERAL MENISECTOMY;  Surgeon: Renette Butters, MD;  Location: [facility];  Service: Orthopedics;  Laterality: Right;  . TUBAL LIGATION  yrs ago    I have reviewed the social history and family history with the patient and they are unchanged from previous note.  ALLERGIES:  is allergic to sumatriptan, other, and eggs or egg-derived products.  MEDICATIONS:  Current Outpatient Medications  Medication Sig Dispense Refill  . famotidine (PEPCID) 20 MG tablet TAKE 1 TABLET(20 MG) BY MOUTH TWICE DAILY 30 tablet 0  . gabapentin (NEURONTIN) 300 MG capsule TAKE 1 CAPSULE(300 MG) BY MOUTH THREE TIMES DAILY 90 capsule 2  . naproxen (NAPROSYN) 250 MG tablet TAKE 1 TABLET(250 MG) BY MOUTH TWICE DAILY WITH FOOD AS NEEDED 30 tablet 0  . acetaminophen (TYLENOL) 500  MG tablet Take 1 tablet (500 mg total) by mouth every 8 (eight) hours as needed for mild pain. 30 tablet 1  . cephALEXin (KEFLEX) 500 MG capsule Take 1 capsule (500 mg total) by mouth 4 (four) times daily for 10 days. 40 capsule 0  . diazepam (VALIUM) 5 MG tablet Take 1 tablet (5 mg  total) by mouth every hour as needed for anxiety (upcoming MRI). 2 tablet 0  . folic acid (FOLVITE) 1 MG tablet Take 1 tablet (1 mg total) by mouth daily. 30 tablet 5  . lisinopril-hydrochlorothiazide (ZESTORETIC) 10-12.5 MG tablet Take 1 tablet by mouth daily. 90 tablet 3  . magnesium oxide (MAG-OX) 400 MG tablet Take 1 tablet (400 mg total) by mouth daily. 10 tablet 0  . Multiple Vitamin (MULTIVITAMIN WITH MINERALS) TABS tablet Take 1 tablet by mouth daily.    . naproxen (NAPROSYN) 500 MG tablet Take 1 tablet (500 mg total) by mouth 2 (two) times daily with a meal. 30 tablet 1  . OLANZapine (ZYPREXA) 5 MG tablet TAKE 1 TABLET(5 MG) BY MOUTH AT BEDTIME 90 tablet 1  . ondansetron (ZOFRAN) 4 MG tablet Take 1 tablet (4 mg total) by mouth every 8 (eight) hours as needed for nausea or vomiting. 20 tablet 0  . pantoprazole (PROTONIX) 40 MG tablet Take 1 tablet (40 mg total) by mouth daily. 30 tablet 1  . polyethylene glycol (MIRALAX / GLYCOLAX) 17 g packet Take 17 g by mouth daily as needed for mild constipation. 14 each 0  . thiamine 100 MG tablet Take 1 tablet (100 mg total) by mouth daily. 30 tablet 0  . tiZANidine (ZANAFLEX) 4 MG tablet TAKE 1 TO 2 TABLETS(4 TO 8 MG) BY MOUTH EVERY 6 HOURS AS NEEDED FOR MUSCLE SPASMS 21 tablet 0   No current facility-administered medications for this visit.    PHYSICAL EXAMINATION: ECOG PERFORMANCE STATUS: {CHL ONC ECOG PS:[phone]}  There were no vitals filed for this visit. There were no vitals filed for this visit. *** GENERAL:alert, no distress and comfortable SKIN: skin color, texture, turgor are normal, no rashes or significant lesions EYES: normal, Conjunctiva are pink and non-injected, sclera clear {OROPHARYNX:no exudate, no erythema and lips, buccal mucosa, and tongue normal}  NECK: supple, thyroid normal size, non-tender, without nodularity LYMPH:  no palpable lymphadenopathy in the cervical, axillary {or inguinal} LUNGS: clear to auscultation  and percussion with normal breathing effort HEART: regular rate & rhythm and no murmurs and no lower extremity edema ABDOMEN:abdomen soft, non-tender and normal bowel sounds Musculoskeletal:no cyanosis of digits and no clubbing  NEURO: alert & oriented x 3 with fluent speech, no focal motor/sensory deficits  LABORATORY DATA:  I have reviewed the data as listed CBC Latest Ref Rng & Units 02/19/2020 11/20/2019 10/14/2019  WBC 4.0 - 10.5 K/uL 2.8(L) 5.2 5.4  Hemoglobin 12.0 - 15.0 g/dL 11.1(L) 9.9(L) 8.8(L)  Hematocrit 36.0 - 46.0 % 33.1(L) 30.3(L) 26.2(L)  Platelets 150 - 400 K/uL 153 178 198     CMP Latest Ref Rng & Units 10/14/2019 10/12/2019 10/11/2019  Glucose 70 - 99 mg/dL 92 92 105(H)  BUN 6 - 20 mg/dL <5(L) <5(L) 9  Creatinine 0.44 - 1.00 mg/dL 0.55 0.51 0.88  Sodium 135 - 145 mmol/L 139 137 134(L)  Potassium 3.5 - 5.1 mmol/L 3.8 4.1 3.9  Chloride 98 - 111 mmol/L 104 106 109  CO2 22 - 32 mmol/L 25 23 14(L)  Calcium 8.9 - 10.3 mg/dL 8.3(L) 8.3(L) 7.3(L)  Total Protein 6.5 -  8.1 g/dL - 5.1(L) 5.3(L)  Total Bilirubin 0.3 - 1.2 mg/dL - 0.5 0.5  Alkaline Phos 38 - 126 U/L - 93 106  AST 15 - 41 U/L - 33 41  ALT 0 - 44 U/L - 10 12      RADIOGRAPHIC STUDIES: I have personally reviewed the radiological images as listed and agreed with the findings in the report. No results found.   ASSESSMENT & PLAN:  Kristin [name] is a 45 y.o. female with    1.Severe anemia, normocytic and hypoproductive -Her anemia work-up showed low reticulocyte count, normal MCV, elevated ferritin and D43, low folic acid level, no lab evidence of hemolysis -Her 09/2019 CT scan showed noevidence of internal bleeding, it does show evidence of colitis, mild hepatomegaly with hepatic steatosis. -Her anemia is likely related to folate deficiency, anemia of chronic disease and alcohol. My suspicion for MDS or other malignant hematological disorder is low, I do not think she needs bone marrow biopsy  -She is  currently on folic Acd 1g daily.  ***   -continue lab monitoring -I recommend her to take prenatal vitamin once daily   2. Folate deficiency due to alcohol drinking, Alcohol Cessation  -He is a folate deficiency is related to her alcohol abuse -Continue folic acid once daily  3. Nausea, GERD, Diarrhea, colitis -Continue medications.  -this has been recurrent  -she has EGD and colonoscopy on 10/15/2019 which were markable except a mild gastritis -f/u with GI    4.Anxiety, depression and bipolar -Continue medications.    PLAN:  *** -Reviewed, anemia improved, will continue monitoring -New folic acid 1 mg daily -I suggested her to start prenatal multivitamin once daily -f/u in 6 months with lab    No problem-specific Assessment & Plan notes found for this encounter.   No orders of the defined types were placed in this encounter.  All questions were answered. The patient knows to call the clinic with any problems, questions or concerns. No barriers to learning was detected. The total time spent in the appointment was {CHL ONC TIME VISIT - BDHDI:[id number]}.     Joslyn Devon 05/17/2020   Oneal Deputy, am acting as scribe for Truitt Merle, MD.   {Add scribe attestation statement}

## 2020-05-20 ENCOUNTER — Inpatient Hospital Stay: Payer: 59 | Admitting: Hematology

## 2020-05-20 ENCOUNTER — Other Ambulatory Visit: Payer: Self-pay | Admitting: Family Medicine

## 2020-05-20 ENCOUNTER — Inpatient Hospital Stay: Payer: 59 | Attending: Hematology

## 2020-05-20 DIAGNOSIS — G8929 Other chronic pain: Secondary | ICD-10-CM

## 2020-05-26 ENCOUNTER — Other Ambulatory Visit: Payer: Self-pay | Admitting: Hematology

## 2020-05-26 DIAGNOSIS — D649 Anemia, unspecified: Secondary | ICD-10-CM

## 2020-05-30 ENCOUNTER — Observation Stay (HOSPITAL_COMMUNITY): Payer: 59

## 2020-05-30 ENCOUNTER — Encounter (HOSPITAL_COMMUNITY): Payer: Self-pay | Admitting: *Deleted

## 2020-05-30 ENCOUNTER — Other Ambulatory Visit: Payer: Self-pay

## 2020-05-30 ENCOUNTER — Observation Stay (HOSPITAL_COMMUNITY)
Admission: EM | Admit: 2020-05-30 | Discharge: 2020-05-31 | Disposition: A | Discharge status: Home / Self Care | Payer: 59 | Attending: Family Medicine | Admitting: Family Medicine

## 2020-05-30 ENCOUNTER — Emergency Department (HOSPITAL_COMMUNITY): Payer: 59

## 2020-05-30 DIAGNOSIS — R1115 Cyclical vomiting syndrome unrelated to migraine: Secondary | ICD-10-CM | POA: Diagnosis not present

## 2020-05-30 DIAGNOSIS — R1030 Lower abdominal pain, unspecified: Secondary | ICD-10-CM | POA: Diagnosis not present

## 2020-05-30 DIAGNOSIS — R197 Diarrhea, unspecified: Secondary | ICD-10-CM | POA: Diagnosis not present

## 2020-05-30 DIAGNOSIS — N179 Acute kidney failure, unspecified: Secondary | ICD-10-CM | POA: Insufficient documentation

## 2020-05-30 DIAGNOSIS — R112 Nausea with vomiting, unspecified: Secondary | ICD-10-CM | POA: Diagnosis not present

## 2020-05-30 DIAGNOSIS — I10 Essential (primary) hypertension: Secondary | ICD-10-CM | POA: Insufficient documentation

## 2020-05-30 DIAGNOSIS — R7401 Elevation of levels of liver transaminase levels: Secondary | ICD-10-CM | POA: Diagnosis not present

## 2020-05-30 DIAGNOSIS — K591 Functional diarrhea: Secondary | ICD-10-CM

## 2020-05-30 DIAGNOSIS — E860 Dehydration: Secondary | ICD-10-CM | POA: Insufficient documentation

## 2020-05-30 DIAGNOSIS — F17210 Nicotine dependence, cigarettes, uncomplicated: Secondary | ICD-10-CM | POA: Diagnosis not present

## 2020-05-30 DIAGNOSIS — I959 Hypotension, unspecified: Principal | ICD-10-CM | POA: Insufficient documentation

## 2020-05-30 DIAGNOSIS — I9589 Other hypotension: Secondary | ICD-10-CM | POA: Diagnosis not present

## 2020-05-30 DIAGNOSIS — K219 Gastro-esophageal reflux disease without esophagitis: Secondary | ICD-10-CM | POA: Diagnosis not present

## 2020-05-30 DIAGNOSIS — Z79899 Other long term (current) drug therapy: Secondary | ICD-10-CM | POA: Diagnosis not present

## 2020-05-30 DIAGNOSIS — F319 Bipolar disorder, unspecified: Secondary | ICD-10-CM | POA: Diagnosis not present

## 2020-05-30 DIAGNOSIS — R571 Hypovolemic shock: Secondary | ICD-10-CM

## 2020-05-30 DIAGNOSIS — F1010 Alcohol abuse, uncomplicated: Secondary | ICD-10-CM

## 2020-05-30 DIAGNOSIS — E861 Hypovolemia: Secondary | ICD-10-CM

## 2020-05-30 DIAGNOSIS — Z20822 Contact with and (suspected) exposure to covid-19: Secondary | ICD-10-CM | POA: Insufficient documentation

## 2020-05-30 LAB — COMPREHENSIVE METABOLIC PANEL
ALT: 21 U/L (ref 0–44)
AST: 215 U/L — ABNORMAL HIGH (ref 15–41)
Albumin: 3.3 g/dL — ABNORMAL LOW (ref 3.5–5.0)
Alkaline Phosphatase: 75 U/L (ref 38–126)
Anion gap: 14 (ref 5–15)
BUN: 15 mg/dL (ref 6–20)
CO2: 13 mmol/L — ABNORMAL LOW (ref 22–32)
Calcium: 7.8 mg/dL — ABNORMAL LOW (ref 8.9–10.3)
Chloride: 109 mmol/L (ref 98–111)
Creatinine, Ser: 1.26 mg/dL — ABNORMAL HIGH (ref 0.44–1.00)
GFR, Estimated: 53 mL/min — ABNORMAL LOW (ref >60)
Glucose, Bld: 110 mg/dL — ABNORMAL HIGH (ref 70–99)
Potassium: 3.3 mmol/L — ABNORMAL LOW (ref 3.5–5.1)
Sodium: 136 mmol/L (ref 135–145)
Total Bilirubin: 0.8 mg/dL (ref 0.3–1.2)
Total Protein: 6 g/dL — ABNORMAL LOW (ref 6.5–8.1)

## 2020-05-30 LAB — CBC WITH DIFFERENTIAL/PLATELET
Abs Immature Granulocytes: 0.04 10*3/uL (ref 0.00–0.07)
Basophils Absolute: 0.1 10*3/uL (ref 0.0–0.1)
Basophils Relative: 1 %
Eosinophils Absolute: 0.1 10*3/uL (ref 0.0–0.5)
Eosinophils Relative: 1 %
HCT: 36.9 % (ref 36.0–46.0)
Hemoglobin: 11.7 g/dL — ABNORMAL LOW (ref 12.0–15.0)
Immature Granulocytes: 0 %
Lymphocytes Relative: 12 %
Lymphs Abs: 1.2 10*3/uL (ref 0.7–4.0)
MCH: 30.6 pg (ref 26.0–34.0)
MCHC: 31.7 g/dL (ref 30.0–36.0)
MCV: 96.6 fL (ref 80.0–100.0)
Monocytes Absolute: 0.6 10*3/uL (ref 0.1–1.0)
Monocytes Relative: 7 %
Neutro Abs: 7.7 10*3/uL (ref 1.7–7.7)
Neutrophils Relative %: 79 %
Platelets: 150 10*3/uL (ref 150–400)
RBC: 3.82 MIL/uL — ABNORMAL LOW (ref 3.87–5.11)
RDW: 15 % (ref 11.5–15.5)
WBC: 9.7 10*3/uL (ref 4.0–10.5)
nRBC: 0 % (ref 0.0–0.2)

## 2020-05-30 LAB — POC OCCULT BLOOD, ED: Fecal Occult Bld: POSITIVE — AB

## 2020-05-30 LAB — TYPE AND SCREEN
ABO/RH(D): O POS
Antibody Screen: NEGATIVE

## 2020-05-30 LAB — LACTIC ACID, PLASMA
Lactic Acid, Venous: 4.5 mmol/L (ref 0.5–1.9)
Lactic Acid, Venous: 2.9 mmol/L (ref 0.5–1.9)

## 2020-05-30 LAB — RESP PANEL BY RT-PCR (FLU A&B, COVID) ARPGX2
Influenza A by PCR: NEGATIVE
Influenza B by PCR: NEGATIVE
SARS Coronavirus 2 by RT PCR: NEGATIVE

## 2020-05-30 LAB — ACETAMINOPHEN LEVEL: Acetaminophen (Tylenol), Serum: <10 ug/mL — ABNORMAL LOW (ref 10–30)

## 2020-05-30 LAB — ETHANOL: Alcohol, Ethyl (B): 199 mg/dL — ABNORMAL HIGH (ref <10)

## 2020-05-30 LAB — LIPASE, BLOOD: Lipase: 128 U/L — ABNORMAL HIGH (ref 11–51)

## 2020-05-30 LAB — TROPONIN I (HIGH SENSITIVITY)
Troponin I (High Sensitivity): 5 ng/L (ref <18)
Troponin I (High Sensitivity): 3 ng/L (ref <18)

## 2020-05-30 LAB — SALICYLATE LEVEL: Salicylate Lvl: <7 mg/dL — ABNORMAL LOW (ref 7.0–30.0)

## 2020-05-30 MED ORDER — ONDANSETRON HCL 4 MG/2ML IJ SOLN
4.0000 mg | Freq: Once | INTRAMUSCULAR | Status: AC
  Administered 2020-05-30: 4 mg via INTRAVENOUS
  Filled 2020-05-30: qty 2

## 2020-05-30 MED ORDER — SODIUM CHLORIDE 0.9 % IV BOLUS
1000.0000 mL | Freq: Once | INTRAVENOUS | Status: AC
  Administered 2020-05-30: 1000 mL via INTRAVENOUS

## 2020-05-30 MED ORDER — OLANZAPINE 5 MG PO TABS
5.0000 mg | ORAL_TABLET | Freq: Every day | ORAL | Status: DC
  Administered 2020-05-30 – 2020-05-31 (×2): 5 mg via ORAL
  Filled 2020-05-30 (×3): qty 1

## 2020-05-30 MED ORDER — THIAMINE HCL 100 MG PO TABS
100.0000 mg | ORAL_TABLET | Freq: Every day | ORAL | Status: DC
  Administered 2020-05-30 – 2020-05-31 (×2): 100 mg via ORAL
  Filled 2020-05-30 (×2): qty 1

## 2020-05-30 MED ORDER — TIZANIDINE HCL 4 MG PO TABS
4.0000 mg | ORAL_TABLET | Freq: Four times a day (QID) | ORAL | Status: DC | PRN
  Administered 2020-05-30 – 2020-05-31 (×2): 4 mg via ORAL
  Filled 2020-05-30 (×5): qty 1

## 2020-05-30 MED ORDER — SODIUM CHLORIDE 0.9 % IV SOLN: INTRAVENOUS | Status: DC

## 2020-05-30 MED ORDER — PANTOPRAZOLE SODIUM 40 MG IV SOLR
40.0000 mg | Freq: Once | INTRAVENOUS | Status: AC
  Administered 2020-05-30: 40 mg via INTRAVENOUS
  Filled 2020-05-30: qty 40

## 2020-05-30 MED ORDER — ONDANSETRON HCL 4 MG PO TABS: 4.0000 mg | ORAL_TABLET | Freq: Three times a day (TID) | ORAL | Status: DC | PRN

## 2020-05-30 MED ORDER — FOLIC ACID 1 MG PO TABS
1.0000 mg | ORAL_TABLET | Freq: Every day | ORAL | Status: DC
Start: 2020-05-30 — End: 2020-06-01
  Administered 2020-05-30 – 2020-05-31 (×2): 1 mg via ORAL
  Filled 2020-05-30 (×2): qty 1

## 2020-05-30 MED ORDER — PANTOPRAZOLE SODIUM 40 MG PO TBEC
40.0000 mg | DELAYED_RELEASE_TABLET | Freq: Every day | ORAL | Status: DC
  Administered 2020-05-30 – 2020-05-31 (×2): 40 mg via ORAL
  Filled 2020-05-30 (×2): qty 1

## 2020-05-30 MED ORDER — SODIUM CHLORIDE 0.9 % IV BOLUS
2000.0000 mL | Freq: Once | INTRAVENOUS | Status: AC
  Administered 2020-05-30: 2000 mL via INTRAVENOUS

## 2020-05-30 MED ORDER — FAMOTIDINE 20 MG PO TABS
20.0000 mg | ORAL_TABLET | Freq: Two times a day (BID) | ORAL | Status: DC
  Administered 2020-05-30 – 2020-05-31 (×3): 20 mg via ORAL
  Filled 2020-05-30 (×3): qty 1

## 2020-05-30 MED ORDER — GABAPENTIN 300 MG PO CAPS
300.0000 mg | ORAL_CAPSULE | Freq: Three times a day (TID) | ORAL | Status: DC
  Administered 2020-05-30 (×2): 300 mg via ORAL
  Filled 2020-05-30 (×2): qty 1

## 2020-05-30 NOTE — Progress Notes (Signed)
Went to see pt this evening. She was laying in bed sleeping deeply. She did not respond to words or sternal rub. Went her food arrived she woke up she woke up immediately and was alert.  Continue to monitor mental status.  Towanda Octave MD [facility]

## 2020-05-30 NOTE — Progress Notes (Signed)
Called Dr Shelbie Proctor from [facility] who will see the pt tomorrow morning.  Towanda Octave MD [facility]

## 2020-05-30 NOTE — H&P (Addendum)
Family Medicine Teaching [facility] Admission History and Physical Service Pager: [phone]  Patient name: Kristin [name] Medical record number: [id number] Date of birth: Dec 11, 1975 Age: 45 y.o. Gender: female  Primary Care Provider: Derrel Nipresenzo, Victor, MD Consultants: None  Code Status: Full Code  Preferred Emergency Contact: Rosey BathShirley McLean, Mother, [phone]  Chief Complaint: abdominal pain   Assessment and Plan: Kristin [name] is a 45 y.o. female presenting with hypotension and abdominal pain . PMH is significant for IDA, migraine, hypertension, bipolar 1 disorder, GERD.  Hypotension secondary to dehydration in setting of vomiting & diarrhea Patient noted to have systolic blood pressure of 63 during evaluation by EMS.  Patient reports that she has been experiencing chronic diarrhea and vomiting.  She reports no more than 1 day without vomiting and/or diarrhea.  She denies any recent illnesses.  Patient states that she began to have some dizziness with standing earlier yesterday.  Patient reports she has had one mixed drink last night and drinks beer nightly. -Admit to PTS, attending Dr. Leveda AnnaHensel, MedSurg -Vitals per floor protocol -Continue IV fluids, normal saline at 125 mL/h -Zofran for nausea/vomiting   Abdominal pain  GERD Patient has history of GERD and presented stating that she had severe reflux symptoms.  Home medications include  Imaging includes abdominal x-ray with no abnormal findings. Patient evaluated by GI in 2021 and EGD acute gastritis. Colonoscopy in 2021 showed internal hemorrhoids.  Patient reports chronic nausea, vomiting and diarrhea.  She reports no recent increase in her nausea and emesis recently.  Lipase elevated at 215.  Given epigastric abdominal pain, some concern for pancreatitis with history of alcohol use and patient is report of drinking beer daily and having mixed drinks prior to onset dizziness.  History of frequent diarrhea concerning for  malabsorption for form inflammatory bowel disease versus irritable bowel syndrome.  Patient has history of gastritis. - f/u CT abdomen and pelvis  - Zofran for nausea and emesis  - repeat EKG to monitor QTC  - continue home Protonix   AKI Likely pre-renal in nature given diarrhea and emesis.  - continue IVF  - monitor Cr with BMP daily  - avoid nephrotoxic agents    Bipolar 1 disorder Home medications include Zyprexa 5 mg. -Continue Zyprexa  Hypertension Blood pressure initially low in ED with systolic of 63 but improved after IV fluids.  Last blood pressure was 76/49.  Patient home medications include senna lisinopril-hydrochlorothiazide 10-12.5 mg.  Patient reports adherence to medication regimen.  Patient does not report any dizziness with standing or with ambulation she states that her blood pressure is normally elevated. -Hold lisinopril-hydrochlorothiazide due to AKI and hypotension  History of migraine She denies at this time. -Will monitor  FEN/GI: clear liquids  Prophylaxis: SCDs  Disposition: med surg   History of Present Illness:  Kristin [name] is a 45 y.o. female presenting with abdominal pain, chronic nausea and emesis and diarrhea.   Patient reports that she has chronic diarrhea and emesis have any changes recently.  She denies any recent illnesses.  Patient states that yesterday, she began to feel dizzy with standing and felt that she needed to go to the hospital because she usually feels the way she when she has become dehydrated.  She reports having some fever.  Reports smoking 1/2 pack of cigarettes per day  Drinks 1 beer daily, had one mixed drink prior to onset of symptoms Reports marijuana use   Review Of Systems: Per HPI with the following additions:  Review of Systems  Constitutional: Positive for appetite change. Negative for fever.  HENT: Negative for rhinorrhea and sore throat.   Respiratory: Negative for cough and shortness of breath.    Cardiovascular: Negative for chest pain.  Gastrointestinal: Positive for abdominal pain, diarrhea, nausea and vomiting. Negative for blood in stool and constipation.  Genitourinary: Negative for dysuria, hematuria and vaginal bleeding.  Neurological: Negative for dizziness.     Patient Active Problem List   Diagnosis Date Noted  . Hypotension 05/30/2020  . Transaminitis   . Emesis, persistent   . Functional diarrhea   . Migraine headache   . Lower extremity weakness 02/15/2020  . Antibiotic-induced yeast infection 02/15/2020  . Anemia   . Syncope 10/10/2019  . Recurrent UTI 08/06/2019  . Frequent urination 07/29/2019  . Anxiety 07/07/2019  . Suicidal ideation 08/08/2018  . Vaginal irritation 03/07/2018  . Acute cystitis 11/09/2017  . Injury of toe on right foot 10/16/2017  . Esophagitis determined by endoscopy   . Dehydration   . Nausea & vomiting 09/18/2017  . AKI (acute kidney injury) (HCC)   . Bipolar disorder in remission (HCC)   . Back pain 08/25/2016  . Generalized anxiety disorder 08/23/2015  . Osteoarthritis of right knee 12/21/2014  . Dysuria 04/22/2014  . Vaginal discharge 04/22/2014  . Tinea pedis of both feet 10/02/2013  . Menorrhagia 09/04/2012  . Allergic rhinitis 06/12/2012  . Eczema 03/26/2012  . Alcohol abuse 10/26/2010  . Bipolar 1 disorder (HCC) 10/09/2010  . Panic attacks 10/09/2010  . INSOMNIA, CHRONIC 06/29/2009  . TOBACCO USER 01/23/2009  . Obesity 10/29/2008  . Essential hypertension 12/20/2006    Past Medical History: Past Medical History:  Diagnosis Date  . Acid reflux   . ACL tear 2011   not sure which side  . Acute lateral meniscus tear of right knee   . Anxiety   . Arthritis   . Back pain    slipped disc lower back lumbar  . Bipolar 1 disorder (HCC)   . Depression   . Dysfunctional uterine bleeding   . Eczema   . Hypertension   . Insomnia   . Migraine headache   . Neuromuscular disorder (HCC)    leg cramps    Past  Surgical History: Past Surgical History:  Procedure Laterality Date  . BIOPSY  09/19/2017   Procedure: BIOPSY;  Surgeon: Kathi Der, MD;  Location: [facility];  Service: Gastroenterology;;  . BIOPSY  10/15/2019   Procedure: BIOPSY;  Surgeon: Charlott Rakes, MD;  Location: [facility];  Service: Endoscopy;;  . COLONOSCOPY N/A 10/15/2019   Procedure: COLONOSCOPY;  Surgeon: Charlott Rakes, MD;  Location: [facility];  Service: Endoscopy;  Laterality: N/A;  . DILITATION & CURRETTAGE/HYSTROSCOPY WITH HYDROTHERMAL ABLATION N/A 04/04/2017   Procedure: DILATATION & CURETTAGE/HYSTEROSCOPY WITH HYDROTHERMAL ABLATION;  Surgeon: Reva Bores, MD;  Location: [facility];  Service: Gynecology;  Laterality: N/A;  . ESOPHAGOGASTRODUODENOSCOPY N/A 10/15/2019   Procedure: ESOPHAGOGASTRODUODENOSCOPY (EGD);  Surgeon: Charlott Rakes, MD;  Location: [facility];  Service: Endoscopy;  Laterality: N/A;  . ESOPHAGOGASTRODUODENOSCOPY (EGD) WITH PROPOFOL N/A 09/19/2017   Procedure: ESOPHAGOGASTRODUODENOSCOPY (EGD) WITH PROPOFOL;  Surgeon: Kathi Der, MD;  Location: [facility];  Service: Gastroenterology;  Laterality: N/A;  . KNEE ARTHROSCOPY WITH LATERAL MENISECTOMY Right 09/30/2019   Procedure: KNEE ARTHROSCOPY WITH LATERAL MENISECTOMY;  Surgeon: Sheral Apley, MD;  [facility]  Service: Orthopedics;  Laterality: Right;  . TUBAL LIGATION  yrs ago    Social  History: Social History   Tobacco Use  . Smoking status: Current Some Day Smoker    Packs/day: 0.25    Years: 5.00    Pack years: 1.25    Types: Cigarettes  . Smokeless tobacco: Never Used  Vaping Use  . Vaping Use: Never used  Substance Use Topics  . Alcohol use: Yes    Comment: occassionally, longer than one week ago  . Drug use: No   Additional social history: Patient intoxicated upon admission Please also refer to relevant sections of EMR.  Family History: Family History  Problem  Relation Age of Onset  . Breast cancer Maternal Grandmother   . Hypertension Mother   . Hypertension Brother   . Colon cancer Maternal Uncle   . Bipolar disorder Cousin   . Schizophrenia Cousin     Allergies and Medications: Allergies  Allergen Reactions  . Sumatriptan Anaphylaxis    High blood pressure and numb  . Other     Bananas-stomach cramps  . Eggs Or Egg-Derived Products Rash and Other (See Comments)    Stomach cramps   No current facility-administered medications on file prior to encounter.   Current Outpatient Medications on File Prior to Encounter  Medication Sig Dispense Refill  . acetaminophen (TYLENOL) 500 MG tablet Take 1 tablet (500 mg total) by mouth every 8 (eight) hours as needed for mild pain. 30 tablet 1  . diphenhydrAMINE (BENADRYL) 25 MG tablet Take 25 mg by mouth every 6 (six) hours as needed for allergies, sleep or itching.    . famotidine (PEPCID) 20 MG tablet TAKE 1 TABLET(20 MG) BY MOUTH TWICE DAILY (Patient taking differently: Take 20 mg by mouth 2 (two) times daily.) 30 tablet 0  . folic acid (FOLVITE) 1 MG tablet Take 1 tablet (1 mg total) by mouth daily. 30 tablet 5  . gabapentin (NEURONTIN) 300 MG capsule TAKE 1 CAPSULE(300 MG) BY MOUTH THREE TIMES DAILY (Patient taking differently: Take 300 mg by mouth 3 (three) times daily.) 90 capsule 2  . ibuprofen (ADVIL) 200 MG tablet Take 200 mg by mouth every 6 (six) hours as needed for headache or moderate pain.    Marland Kitchen lisinopril-hydrochlorothiazide (ZESTORETIC) 10-12.5 MG tablet Take 1 tablet by mouth daily. 90 tablet 3  . naproxen sodium (ALEVE) 220 MG tablet Take 440 mg by mouth daily as needed (pain).    Marland Kitchen OLANZapine (ZYPREXA) 5 MG tablet TAKE 1 TABLET(5 MG) BY MOUTH AT BEDTIME (Patient taking differently: Take 5 mg by mouth at bedtime.) 90 tablet 1  . ondansetron (ZOFRAN) 4 MG tablet Take 1 tablet (4 mg total) by mouth every 8 (eight) hours as needed for nausea or vomiting. 20 tablet 0  . pantoprazole  (PROTONIX) 40 MG tablet Take 1 tablet (40 mg total) by mouth daily. 30 tablet 1  . tiZANidine (ZANAFLEX) 4 MG tablet TAKE 1 TO 2 TABLETS(4 TO 8 MG) BY MOUTH EVERY 6 HOURS AS NEEDED FOR MUSCLE SPASMS (Patient taking differently: Take 4-8 mg by mouth every 6 (six) hours as needed for muscle spasms.) 21 tablet 0  . diazepam (VALIUM) 5 MG tablet Take 1 tablet (5 mg total) by mouth every hour as needed for anxiety (upcoming MRI). (Patient not taking: Reported on 05/30/2020) 2 tablet 0  . [DISCONTINUED] fluticasone (FLONASE) 50 MCG/ACT nasal spray Place 2 sprays into both nostrils daily. (Patient taking differently: Place 2 sprays into both nostrils daily as needed for allergies. ) 1 g 0  . [DISCONTINUED] promethazine (PHENERGAN) 25 MG tablet  Take 1 tablet (25 mg total) by mouth every 6 (six) hours as needed for nausea or vomiting. 10 tablet 0    Objective: BP 99/73   Pulse 77   Temp (!) 96.1 F (35.6 C) (Temporal)   Resp 18   Ht 5\' 3"  (1.6 m)   Wt 77.6 kg   SpO2 98%   BMI 30.29 kg/m   Exam: General: Female appearing stated age, no acute distress pleasantly conversational Eyes: No scleral icterus, no conjunctival injection ENTM: Mucous membranes appear dry Cardiovascular: Regular rate and rhythm, no murmurs appreciated Respiratory: Decreased breath sounds throughout, no crackles, no wheezing Gastrointestinal: Epigastric area lateral lower quadrant tenderness, soft MSK: Patient with normal range of motion of bilateral upper and lower extremities, without difficulty Derm: No rashes, ulcerations or lesions noted Neuro: Alert and oriented x4 Psych: Appropriately responds to questions, no objective evidence of internal stimuli  Labs and Imaging: CBC BMET  Recent Labs  Lab 05/30/20 0054  WBC 9.7  HGB 11.7*  HCT 36.9  PLT 150   Recent Labs  Lab 05/30/20 0054  NA 136  K 3.3*  CL 109  CO2 13*  BUN 15  CREATININE 1.26*  GLUCOSE 110*  CALCIUM 7.8*     EKG: Normal sinus rhythm, no  signs of ischemia, normal intervals  CT ABDOMEN PELVIS WO CONTRAST  Result Date: 05/30/2020 CLINICAL DATA:  45 year old female with abdominal pain. Vomiting and diarrhea. EXAM: CT ABDOMEN AND PELVIS WITHOUT CONTRAST TECHNIQUE: Multidetector CT imaging of the abdomen and pelvis was performed following the standard protocol without IV contrast. COMPARISON:  CT Abdomen and Pelvis 10/11/2019 and earlier. FINDINGS: Lower chest: Larger lung volumes from July. Lung bases are clear. No cardiomegaly. No pericardial or pleural effusion. Hepatobiliary: Contracted gallbladder.  Negative noncontrast liver. Pancreas: Negative noncontrast pancreas. Spleen: Negative. Adrenals/Urinary Tract: Negative. No nephrolithiasis or hydronephrosis. Decompressed bladder. Stomach/Bowel: Fluid throughout nondilated large bowel to the rectum. Mildly redundant sigmoid. No definite large bowel inflammation today. Appendix is normal on coronal image 55. Mildly thickened and indistinct distal small bowel loops in the lower abdomen and pelvis. See coronal image 50. Mild associated lower small bowel mesenteric stranding. The most abnormal loops are not abnormally dilated (series 3, image 78). The abnormality abates at the terminal ileum. No upstream dilated small bowel, although much of the small bowel is fluid-filled. No free air. No free fluid. No other areas of small bowel mesenteric inflammation. Stomach and duodenum appear negative. A small fat containing umbilical hernia is noted on series 3, image 40 with no complicating features. Vascular/Lymphatic: Normal caliber abdominal aorta. Minimal calcified atherosclerosis. Vascular patency is not evaluated in the absence of IV contrast. No lymphadenopathy. Reproductive: Negative noncontrast appearance. Other: No pelvic free fluid. Musculoskeletal: Negative aside from some lower thoracic costovertebral junction degeneration. IMPRESSION: 1. Fluid-filled and inflamed but nondilated distal small bowel  loops in the bilateral lower abdomen. Top differential considerations are small bowel infection, inflammatory bowel disease. Correlation with serum lactate may be valuable although bowel ischemia is felt unlikely in this case. No associated bowel obstruction. 2. No definite large bowel inflammation although fluid throughout the large bowel to the rectum is compatible with diarrhea. Normal appendix. Electronically Signed   By: August M.D.   On: 05/30/2020 06:16   DG Chest Portable 1 View  Result Date: 05/30/2020 CLINICAL DATA:  45 year old female with vomiting. EXAM: PORTABLE CHEST 1 VIEW COMPARISON:  Chest radiograph dated 10/10/2019. FINDINGS: No focal consolidation, pleural effusion, pneumothorax. Top-normal cardiac size.  No acute osseous pathology. IMPRESSION: No active disease. Electronically Signed   By: Elgie Collard M.D.   On: 05/30/2020 03:21   DG Abd Portable 2 Views  Result Date: 05/30/2020 CLINICAL DATA:  45 year old female with vomiting. EXAM: PORTABLE ABDOMEN - 2 VIEW COMPARISON:  Abdominal radiograph dated 10/10/2019. FINDINGS: No bowel dilatation or evidence of obstruction. No free air or radiopaque calculi. The osseous structures are intact. IMPRESSION: Negative. Electronically Signed   By: Elgie Collard M.D.   On: 05/30/2020 03:21    Ronnald Ramp, MD 05/30/2020, 7:31 AM PGY-2, [facility] FPTS Intern pager: [phone], text pages welcome

## 2020-05-30 NOTE — Progress Notes (Signed)
Patient barely responsive to voice upon arrival to unit.  Disoriented to time, thinking its 1981.  MD at bedside for assessment.  After MD assessment, standing outside and speaking to MD, told dietary to take dinner tray away as they tried to deliver it because patient would not have been able to eat.  Patient sat up in bed and said to leave the tray because she was hungry.  Started eating dinner and asked for the remote. MD at bedside for assessment.

## 2020-05-30 NOTE — ED Notes (Signed)
Pt using bedside commode

## 2020-05-30 NOTE — Discharge Summary (Signed)
Family Medicine Teaching [facility] Discharge Summary  Patient name: Kristin [name] Medical record number: [id number] Date of birth: [birth year] Age: 45 y.o. Gender: female Date of Admission: 05/30/2020  Date of Discharge: 05/31/2020 Admitting Physician: Ronnald Ramp, MD  Primary Care Provider: Derrel Nip, MD Consultants: None  Indication for Hospitalization: AKI, Hypotension, Dehydration  Discharge Diagnoses/Problem List:  Pre-renal AKI Hypotension Nausea, Vomiting, Diarrhea  Disposition: Home  Discharge Condition: Stable  Discharge Exam:  Temp:  [98.9 F (37.2 C)-99.3 F (37.4 C)] 98.9 F (37.2 C) (03/20 2218) Pulse Rate:  [77-112] 81 (03/20 2218) Resp:  [12-21] 16 (03/20 2218) BP: (98-131)/(53-99) 131/93 (03/20 2218) SpO2:  [96 %-100 %] 100 % (03/20 2218) Physical Exam: General: alert, NAD Cardiovascular: RRR no murmurs Respiratory: CTAB. Normal WOB  Abdomen: soft, non distended Extremities: warm, dry. No peripheral edema   Brief Hospital Course:  Kristin [name] is a 45 y.o. female who presented with hypotension and abdominal pain . PMH is significant for IDA, migraine, hypertension, bipolar 1 disorder, GERD. Below is her brief hospital course.  Hypotension secondary to dehydration in setting of vomiting & diarrhea Patient arrived intoxicated to the ED with ethanol level 199. Lactic acid 4.5>2.9, troponin 5>3. Hypotensive to 70's and 80's systolic.  Labs notable for metabolic acidosis with bicarb 13. Started on aggressive IVF (total of 4L NS boluses) with improvement in BP's.   Abdominal pain  GERD Lipase elevated to 128. Abdominal x-ray with no abnormal findings. CT abd/pelv with fluid-filled and inflamed but non-dilated distal small bowel loops in the b/l lower abdomen concerning for small bowel infection vs IBD. Patient was continued on protonix and pepcid throughout admission. GI recommended follow up for HIDA scan and CTA  AKI Creatinine  1.26 in ED (baseline 0.8-1)). Likely pre-renal in the setting of vomiting and diarrhea. Patient started on IV fluids with improvement in Creatinine to 1.10 on d/c.   Other conditions chronic and stable.   Issues for Follow Up:  1. Follow up with PCP- Check BP- lisinopril-hydrochlorothiazide was held due to hypotension and AKI. Can restart if BP normalized.  Patient started on po iron at discharge, can check to see how she is tolerating it.  Also discuss alcohol cessation and potential resources  2. Follow up with GI to get HIDA scan and CT angiography   Significant Procedures: None  Significant Labs and Imaging:  Recent Labs  Lab 05/30/20 0054 05/31/20 0247  WBC 9.7 4.1  HGB 11.7* 8.6*  HCT 36.9 25.7*  PLT 150 103*   Recent Labs  Lab 05/30/20 0054 05/31/20 0247  NA 136 136  K 3.3* 3.5  CL 109 111  CO2 13* 18*  GLUCOSE 110* 113*  BUN 15 14  CREATININE 1.26* 1.10*  CALCIUM 7.8* 8.0*  ALKPHOS 75 58  AST 215* 123*  ALT 21 18  ALBUMIN 3.3* 2.7*     Results/Tests Pending at Time of Discharge: None  Discharge Medications:  Allergies as of 05/31/2020      Reactions   Sumatriptan Anaphylaxis   High blood pressure and numb   Other    Bananas-stomach cramps   Eggs Or Egg-derived Products Rash, Other (See Comments)   Stomach cramps      Medication List    STOP taking these medications   diazepam 5 MG tablet Commonly known as: VALIUM   ibuprofen 200 MG tablet Commonly known as: ADVIL   naproxen sodium 220 MG tablet Commonly known as: ALEVE     TAKE these  medications   acetaminophen 500 MG tablet Commonly known as: TYLENOL Take 1 tablet (500 mg total) by mouth every 8 (eight) hours as needed for mild pain.   diphenhydrAMINE 25 MG tablet Commonly known as: BENADRYL Take 25 mg by mouth every 6 (six) hours as needed for allergies, sleep or itching.   famotidine 20 MG tablet Commonly known as: PEPCID TAKE 1 TABLET(20 MG) BY MOUTH TWICE DAILY What changed:  See the new instructions.   ferrous sulfate 325 (65 FE) MG tablet Take 1 tablet (325 mg total) by mouth daily.   folic acid 1 MG tablet Commonly known as: FOLVITE Take 1 tablet (1 mg total) by mouth daily.   gabapentin 300 MG capsule Commonly known as: NEURONTIN TAKE 1 CAPSULE(300 MG) BY MOUTH THREE TIMES DAILY What changed: See the new instructions.   lisinopril-hydrochlorothiazide 10-12.5 MG tablet Commonly known as: Zestoretic Take 1 tablet by mouth daily.   OLANZapine 5 MG tablet Commonly known as: ZYPREXA TAKE 1 TABLET(5 MG) BY MOUTH AT BEDTIME What changed: See the new instructions.   ondansetron 4 MG tablet Commonly known as: Zofran Take 1 tablet (4 mg total) by mouth every 8 (eight) hours as needed for nausea or vomiting.   pantoprazole 40 MG tablet Commonly known as: PROTONIX Take 1 tablet (40 mg total) by mouth daily.   thiamine 100 MG tablet Take 1 tablet (100 mg total) by mouth daily.   tiZANidine 4 MG tablet Commonly known as: ZANAFLEX TAKE 1 TO 2 TABLETS(4 TO 8 MG) BY MOUTH EVERY 6 HOURS AS NEEDED FOR MUSCLE SPASMS What changed: See the new instructions.       Discharge Instructions: Please refer to Patient Instructions section of EMR for full details.  Patient was counseled important signs and symptoms that should prompt return to medical care, changes in medications, dietary instructions, activity restrictions, and follow up appointments.   Follow-Up Appointments:  Follow-up Information    Derrel Nip, MD. Go on 06/02/2020.   Specialty: Family Medicine Why: Please go to appointment at 9.30am. Arrive 15 mins early for appointment  Contact information: [address] [place] [address] [phone]               Cora Collum, DO 05/31/2020, 6:39 PM PGY-1, [facility]

## 2020-05-30 NOTE — ED Notes (Signed)
Date and time results received: 05/30/20 @ 0829   Test: Lactic Acid  Critical Value: 4.5   Name of Provider Notified: Dr. Sharol HarnessRoxan Hockey   No new orders at this time

## 2020-05-30 NOTE — ED Notes (Signed)
Pt sleeps unless shes disturbed  And when she wakes up she c/o urine and poop on her.  She will require at least 2 people to clean her up  She is not c0-operating  Everyone is busy with all the emss that have arrived with mvcs and critical patients  She will be cleaned as soon as I have help to do so and she has been told this numurus times

## 2020-05-30 NOTE — ED Triage Notes (Signed)
The pt arrived by gems from home  Pt sick with vomiting diarrhea for one month. Pt reports she has been drinking alcohol tonight. She cannot keep her eyes open  When shes not being stimulayed she sleeps  She has urine and poop all over her  Denies drug use admits to alcohol today

## 2020-05-30 NOTE — Hospital Course (Addendum)
Kristin [name] is a 45 y.o. female who presented with hypotension and abdominal pain . PMH is significant for IDA, migraine, hypertension, bipolar 1 disorder, GERD. Below is her brief hospital course.  Hypotension secondary to dehydration in setting of vomiting & diarrhea Patient arrived intoxicated to the ED with ethanol level 199. Lactic acid 4.5>2.9, troponin 5>3. Hypotensive to 70's and 80's systolic.  Labs notable for metabolic acidosis with bicarb 13. Started on aggressive IVF (total of 4L NS boluses) with improvement in BP's.   Abdominal pain  GERD Lipase elevated to 128. Abdominal x-ray with no abnormal findings. CT abd/pelv with fluid-filled and inflamed but non-dilated distal small bowel loops in the b/l lower abdomen concerning for small bowel infection vs IBD. Patient was continued on protonix and pepcid throughout admission. GI recommended follow up for HIDA scan and CTA  AKI Creatinine 1.26 in ED (baseline 0.8-1)). Likely pre-renal in the setting of vomiting and diarrhea. Patient started on IV fluids with improvement in Creatinine to 1.10 on d/c.   Other conditions chronic and stable.

## 2020-05-30 NOTE — ED Provider Notes (Signed)
[facility] EMERGENCY DEPARTMENT Provider Note   CSN: [id number] Arrival date & time: 05/30/20  6144     History Chief Complaint  Patient presents with  . Hypotension    Kristin [name] is a 45 y.o. female.  Level 5 caveat for alcohol intoxication.  Patient here by EMS with nausea vomiting diarrhea.  Her speech is slurred and she is difficult to understand.  She states she had one mixed drink tonight.  EMS was apparently called with "heartburn" with nausea and vomiting and diarrhea.  Patient states she has been sick since "last summer" but having increased difficulty with nausea vomiting diarrhea over the past day.  She complains of heartburn and diffuse abdominal pain.  There is been no fever.  Denies any black or bloody stools.  Denies any blood in her emesis.  Denies any pain with urination or blood in the urine. States her entire abdomen is "sore" EMS reports her initial blood pressure was 63 systolic.  She was given 500 cc of fluid and an increase in only to 68 systolic  The history is provided by the patient and the EMS personnel.       Past Medical History:  Diagnosis Date  . Acid reflux   . ACL tear 2011   not sure which side  . Acute lateral meniscus tear of right knee   . Anxiety   . Arthritis   . Back pain    slipped disc lower back lumbar  . Bipolar 1 disorder (HCC)   . Depression   . Dysfunctional uterine bleeding   . Eczema   . Hypertension   . Insomnia   . Migraine headache   . Neuromuscular disorder (HCC)    leg cramps    Patient Active Problem List   Diagnosis Date Noted  . Migraine headache   . Lower extremity weakness 02/15/2020  . Antibiotic-induced yeast infection 02/15/2020  . Anemia   . Syncope 10/10/2019  . Recurrent UTI 08/06/2019  . Frequent urination 07/29/2019  . Anxiety 07/07/2019  . Suicidal ideation 08/08/2018  . Vaginal irritation 03/07/2018  . Acute cystitis 11/09/2017  . Injury of toe on right foot  10/16/2017  . Esophagitis determined by endoscopy   . Dehydration   . Nausea & vomiting 09/18/2017  . AKI (acute kidney injury) (HCC)   . Bipolar disorder in remission (HCC)   . Back pain 08/25/2016  . Generalized anxiety disorder 08/23/2015  . Osteoarthritis of right knee 12/21/2014  . Dysuria 04/22/2014  . Vaginal discharge 04/22/2014  . Tinea pedis of both feet 10/02/2013  . Menorrhagia 09/04/2012  . Allergic rhinitis 06/12/2012  . Eczema 03/26/2012  . Alcohol abuse 10/26/2010  . Bipolar 1 disorder (HCC) 10/09/2010  . Panic attacks 10/09/2010  . INSOMNIA, CHRONIC 06/29/2009  . TOBACCO USER 01/23/2009  . Obesity 10/29/2008  . Essential hypertension 12/20/2006    Past Surgical History:  Procedure Laterality Date  . BIOPSY  09/19/2017   Procedure: BIOPSY;  Surgeon: Kathi Der, MD;  Location: [facility];  Service: Gastroenterology;;  . BIOPSY  10/15/2019   Procedure: BIOPSY;  Surgeon: Charlott Rakes, MD;  Location: [facility];  Service: Endoscopy;;  . COLONOSCOPY N/A 10/15/2019   Procedure: COLONOSCOPY;  Surgeon: Charlott Rakes, MD;  Location: [facility];  Service: Endoscopy;  Laterality: N/A;  . DILITATION & CURRETTAGE/HYSTROSCOPY WITH HYDROTHERMAL ABLATION N/A 04/04/2017   Procedure: DILATATION & CURETTAGE/HYSTEROSCOPY WITH HYDROTHERMAL ABLATION;  Surgeon: Reva Bores, MD;  Location: [facility];  Service: Gynecology;  Laterality: N/A;  . ESOPHAGOGASTRODUODENOSCOPY N/A 10/15/2019   Procedure: ESOPHAGOGASTRODUODENOSCOPY (EGD);  Surgeon: Charlott Rakes, MD;  Location: [facility];  Service: Endoscopy;  Laterality: N/A;  . ESOPHAGOGASTRODUODENOSCOPY (EGD) WITH PROPOFOL N/A 09/19/2017   Procedure: ESOPHAGOGASTRODUODENOSCOPY (EGD) WITH PROPOFOL;  Surgeon: Kathi Der, MD;  Location: [facility];  Service: Gastroenterology;  Laterality: N/A;  . KNEE ARTHROSCOPY WITH LATERAL MENISECTOMY Right 09/30/2019   Procedure: KNEE ARTHROSCOPY WITH LATERAL  MENISECTOMY;  Surgeon: Sheral Apley, MD;  Location: [facility];  Service: Orthopedics;  Laterality: Right;  . TUBAL LIGATION  yrs ago     OB History    Gravida  4   Para  3   Term  3   Preterm      AB  1   Living  3     SAB      IAB  1   Ectopic      Multiple      Live Births              Family History  Problem Relation Age of Onset  . Breast cancer Maternal Grandmother   . Hypertension Mother   . Hypertension Brother   . Colon cancer Maternal Uncle   . Bipolar disorder Cousin   . Schizophrenia Cousin     Social History   Tobacco Use  . Smoking status: Current Some Day Smoker    Packs/day: 0.25    Years: 5.00    Pack years: 1.25    Types: Cigarettes  . Smokeless tobacco: Never Used  Vaping Use  . Vaping Use: Never used  Substance Use Topics  . Alcohol use: Yes    Comment: occassionally, longer than one week ago  . Drug use: No    Home Medications Prior to Admission medications   Medication Sig Start Date End Date Taking? Authorizing Provider  famotidine (PEPCID) 20 MG tablet TAKE 1 TABLET(20 MG) BY MOUTH TWICE DAILY 12/29/19   Derrel Nip, MD  gabapentin (NEURONTIN) 300 MG capsule TAKE 1 CAPSULE(300 MG) BY MOUTH THREE TIMES DAILY 03/26/20   Derrel Nip, MD  naproxen (NAPROSYN) 250 MG tablet TAKE 1 TABLET(250 MG) BY MOUTH TWICE DAILY WITH FOOD AS NEEDED 03/22/20   Derrel Nip, MD  acetaminophen (TYLENOL) 500 MG tablet Take 1 tablet (500 mg total) by mouth every 8 (eight) hours as needed for mild pain. 07/04/19   Marthenia Rolling, DO  diazepam (VALIUM) 5 MG tablet Take 1 tablet (5 mg total) by mouth every hour as needed for anxiety (upcoming MRI). 03/19/20   Katha Cabal, DO  folic acid (FOLVITE) 1 MG tablet Take 1 tablet (1 mg total) by mouth daily. 11/20/19   Malachy Mood, MD  lisinopril-hydrochlorothiazide (ZESTORETIC) 10-12.5 MG tablet Take 1 tablet by mouth daily. 11/11/19   Derrel Nip, MD  magnesium oxide  (MAG-OX) 400 MG tablet Take 1 tablet (400 mg total) by mouth daily. 09/03/19   Renne Crigler, PA-C  Multiple Vitamin (MULTIVITAMIN WITH MINERALS) TABS tablet Take 1 tablet by mouth daily. 10/13/19   Kathlen Mody, MD  naproxen (NAPROSYN) 500 MG tablet Take 1 tablet (500 mg total) by mouth 2 (two) times daily with a meal. 04/16/20   Derrel Nip, MD  OLANZapine (ZYPREXA) 5 MG tablet TAKE 1 TABLET(5 MG) BY MOUTH AT BEDTIME 03/19/20   Derrel Nip, MD  ondansetron (ZOFRAN) 4 MG tablet Take 1 tablet (4 mg total) by mouth every 8 (eight) hours as needed for nausea or vomiting. 09/30/19  McBane, Jerald Kief, PA-C  pantoprazole (PROTONIX) 40 MG tablet Take 1 tablet (40 mg total) by mouth daily. 10/13/19   Kathlen Mody, MD  polyethylene glycol (MIRALAX / GLYCOLAX) 17 g packet Take 17 g by mouth daily as needed for mild constipation. 10/12/19   Kathlen Mody, MD  thiamine 100 MG tablet Take 1 tablet (100 mg total) by mouth daily. 10/13/19   Kathlen Mody, MD  tiZANidine (ZANAFLEX) 4 MG tablet TAKE 1 TO 2 TABLETS(4 TO 8 MG) BY MOUTH EVERY 6 HOURS AS NEEDED FOR MUSCLE SPASMS 05/25/20   Derrel Nip, MD  fluticasone [facility]) 50 MCG/ACT nasal spray Place 2 sprays into both nostrils daily. Patient taking differently: Place 2 sprays into both nostrils daily as needed for allergies.  02/25/17 04/14/19  Belinda Fisher, PA-C  promethazine (PHENERGAN) 25 MG tablet Take 1 tablet (25 mg total) by mouth every 6 (six) hours as needed for nausea or vomiting. 04/17/18 08/29/18  Jacalyn Lefevre, MD    Allergies    Sumatriptan, Other, and Eggs or egg-derived products  Review of Systems   Review of Systems  Unable to perform ROS: Mental status change    Physical Exam Updated Vital Signs BP 108/78   Pulse 89   Temp (!) 96.1 F (35.6 C) (Temporal)   Resp 14   Ht 5\' 3"  (1.6 m)   Wt 77.6 kg   SpO2 100%   BMI 30.29 kg/m   Physical Exam Vitals and nursing note reviewed.  Constitutional:      General: She is in acute  distress.     Appearance: She is well-developed. She is ill-appearing.     Comments: Slurred speech, somnolent  HENT:     Head: Normocephalic and atraumatic.     Mouth/Throat:     Mouth: Mucous membranes are dry.     Pharynx: No oropharyngeal exudate.  Eyes:     Conjunctiva/sclera: Conjunctivae normal.     Pupils: Pupils are equal, round, and reactive to light.  Neck:     Comments: No meningismus. Cardiovascular:     Rate and Rhythm: Normal rate and regular rhythm.     Heart sounds: Normal heart sounds. No murmur heard.   Pulmonary:     Effort: Pulmonary effort is normal. No respiratory distress.     Breath sounds: Normal breath sounds.  Abdominal:     Palpations: Abdomen is soft.     Tenderness: There is abdominal tenderness. There is no guarding or rebound.     Comments: Diffuse tenderness without guarding or rebound  Musculoskeletal:        General: No tenderness. Normal range of motion.     Cervical back: Normal range of motion and neck supple.  Skin:    General: Skin is warm.  Neurological:     Mental Status: She is alert and oriented to person, place, and time.     Cranial Nerves: No cranial nerve deficit.     Motor: No abnormal muscle tone.     Coordination: Coordination normal.     Comments: No ataxia on finger to nose bilaterally. No pronator drift. 5/5 strength throughout. CN 2-12 intact.Equal grip strength. Sensation intact.   Psychiatric:        Behavior: Behavior normal.     ED Results / Procedures / Treatments   Labs (all labs ordered are listed, but only abnormal results are displayed) Labs Reviewed  CBC WITH DIFFERENTIAL/PLATELET - Abnormal; Notable for the following components:      Result Value  RBC 3.82 (*)    Hemoglobin 11.7 (*)    All other components within normal limits  COMPREHENSIVE METABOLIC PANEL - Abnormal; Notable for the following components:   Potassium 3.3 (*)    CO2 13 (*)    Glucose, Bld 110 (*)    Creatinine, Ser 1.26 (*)     Calcium 7.8 (*)    Total Protein 6.0 (*)    Albumin 3.3 (*)    AST 215 (*)    GFR, Estimated 53 (*)    All other components within normal limits  LIPASE, BLOOD - Abnormal; Notable for the following components:   Lipase 128 (*)    All other components within normal limits  ETHANOL - Abnormal; Notable for the following components:   Alcohol, Ethyl (B) 199 (*)    All other components within normal limits  ACETAMINOPHEN LEVEL - Abnormal; Notable for the following components:   Acetaminophen (Tylenol), Serum <10 (*)    All other components within normal limits  SALICYLATE LEVEL - Abnormal; Notable for the following components:   Salicylate Lvl <7.0 (*)    All other components within normal limits  POC OCCULT BLOOD, ED - Abnormal; Notable for the following components:   Fecal Occult Bld POSITIVE (*)    All other components within normal limits  RESP PANEL BY RT-PCR (FLU A&B, COVID) ARPGX2  LACTIC ACID, PLASMA  RAPID URINE DRUG SCREEN, HOSP PERFORMED  I-STAT BETA HCG BLOOD, ED ([facility], [facility], [facility] ONLY)  TYPE AND SCREEN  ABO/RH  TROPONIN I (HIGH SENSITIVITY)  TROPONIN I (HIGH SENSITIVITY)    EKG EKG Interpretation  Date/Time:  Sunday May 30 2020 00:49:23 EDT Ventricular Rate:  80 PR Interval:    QRS Duration: 95 QT Interval:  413 QTC Calculation: 477 R Axis:   87 Text Interpretation: Sinus rhythm No significant change was found Confirmed by Glynn Octaveancour, Stephen [phone]([id number]) on 05/30/2020 1:01:03 AM   Radiology CT ABDOMEN PELVIS WO CONTRAST  Result Date: 05/30/2020 CLINICAL DATA:  45 year old female with abdominal pain. Vomiting and diarrhea. EXAM: CT ABDOMEN AND PELVIS WITHOUT CONTRAST TECHNIQUE: Multidetector CT imaging of the abdomen and pelvis was performed following the standard protocol without IV contrast. COMPARISON:  CT Abdomen and Pelvis 10/11/2019 and earlier. FINDINGS: Lower chest: Larger lung volumes from July. Lung bases are clear. No cardiomegaly. No pericardial or pleural  effusion. Hepatobiliary: Contracted gallbladder.  Negative noncontrast liver. Pancreas: Negative noncontrast pancreas. Spleen: Negative. Adrenals/Urinary Tract: Negative. No nephrolithiasis or hydronephrosis. Decompressed bladder. Stomach/Bowel: Fluid throughout nondilated large bowel to the rectum. Mildly redundant sigmoid. No definite large bowel inflammation today. Appendix is normal on coronal image 55. Mildly thickened and indistinct distal small bowel loops in the lower abdomen and pelvis. See coronal image 50. Mild associated lower small bowel mesenteric stranding. The most abnormal loops are not abnormally dilated (series 3, image 78). The abnormality abates at the terminal ileum. No upstream dilated small bowel, although much of the small bowel is fluid-filled. No free air. No free fluid. No other areas of small bowel mesenteric inflammation. Stomach and duodenum appear negative. A small fat containing umbilical hernia is noted on series 3, image 40 with no complicating features. Vascular/Lymphatic: Normal caliber abdominal aorta. Minimal calcified atherosclerosis. Vascular patency is not evaluated in the absence of IV contrast. No lymphadenopathy. Reproductive: Negative noncontrast appearance. Other: No pelvic free fluid. Musculoskeletal: Negative aside from some lower thoracic costovertebral junction degeneration. IMPRESSION: 1. Fluid-filled and inflamed but nondilated distal small bowel loops in the bilateral lower abdomen.  Top differential considerations are small bowel infection, inflammatory bowel disease. Correlation with serum lactate may be valuable although bowel ischemia is felt unlikely in this case. No associated bowel obstruction. 2. No definite large bowel inflammation although fluid throughout the large bowel to the rectum is compatible with diarrhea. Normal appendix. Electronically Signed   By: Odessa Fleming M.D.   On: 05/30/2020 06:16   DG Chest Portable 1 View  Result Date:  05/30/2020 CLINICAL DATA:  45 year old female with vomiting. EXAM: PORTABLE CHEST 1 VIEW COMPARISON:  Chest radiograph dated 10/10/2019. FINDINGS: No focal consolidation, pleural effusion, pneumothorax. Top-normal cardiac size. No acute osseous pathology. IMPRESSION: No active disease. Electronically Signed   By: Elgie Collard M.D.   On: 05/30/2020 03:21   DG Abd Portable 2 Views  Result Date: 05/30/2020 CLINICAL DATA:  45 year old female with vomiting. EXAM: PORTABLE ABDOMEN - 2 VIEW COMPARISON:  Abdominal radiograph dated 10/10/2019. FINDINGS: No bowel dilatation or evidence of obstruction. No free air or radiopaque calculi. The osseous structures are intact. IMPRESSION: Negative. Electronically Signed   By: Elgie Collard M.D.   On: 05/30/2020 03:21    Procedures .Critical Care Performed by: Glynn Octave, MD Authorized by: Glynn Octave, MD   Critical care provider statement:    Critical care time (minutes):  45   Critical care was necessary to treat or prevent imminent or life-threatening deterioration of the following conditions:  Dehydration and shock   Critical care was time spent personally by me on the following activities:  Discussions with consultants, evaluation of patient's response to treatment, examination of patient, ordering and performing treatments and interventions, ordering and review of laboratory studies, ordering and review of radiographic studies, pulse oximetry, re-evaluation of patient's condition, obtaining history from patient or surrogate and review of old charts     Medications Ordered in ED Medications  sodium chloride 0.9 % bolus 2,000 mL (has no administration in time range)  ondansetron (ZOFRAN) injection 4 mg (has no administration in time range)    ED Course  I have reviewed the triage vital signs and the nursing notes.  Pertinent labs & imaging results that were available during my care of the patient were reviewed by me and considered in  my medical decision making (see chart for details).  EGD 8/21 - Normal esophagus. - Acute gastritis. Biopsied. - Normal examined duodenum. Biopsied  Colonoscopy 8/21 Internal hemorrhoids. - No specimens collected. Impression:    MDM Rules/Calculators/A&P                          Patient intoxicated here with nausea, vomiting and diarrhea.  Apparently this is been ongoing for quite some time.  Worse tonight.  She has been hypotensive for EMS pressures over 70s and 80s.  She denies any pain but her history is unreliable.  Patient given IV fluid resuscitation labs obtained. She continues to have diarrhea and vomiting.  Denies any abdominal pain.  Stool is not grossly bloody but is Hemoccult positive.  EGD and colonoscopy last August as above Hemoglobin stable  Labs show acute renal failure as well as metabolic acidosis with bicarb of 13.  She is aggressively hydrated.  Hemoccult is positive but hemoglobin is stable.  Recent endoscopies did not show any gross abnormalities as above.  Blood pressure is improved to the 120s systolic.  Mental status is improved.  She denies any abdominal pain but continues to have diarrhea. Lipase mildly elevated at [employer] show  alcohol intoxication.  Significant metabolic acidosis likely due to dehydration with bicarb of 13.  Blood pressure has improved to 100 systolic.  Patient is mentating well denies any pain.  Continues to have vomiting and diarrhea.  Hydration is continued.  CT imaging will be obtained of her abdomen to rule out surgical pathology.  Admission discussed with family practice residents Final Clinical Impression(s) / ED Diagnoses Final diagnoses:  None    Rx / DC Orders ED Discharge Orders    None       Rancour, Jeannett Senior, MD 05/30/20 [phone]

## 2020-05-30 NOTE — ED Notes (Signed)
Notified Dr. Beryl Meager of pt's BP's trending in the lower 100 systolic with MAP 65 and greater. No new orders at this time.

## 2020-05-30 NOTE — ED Notes (Signed)
Report received from previous shift

## 2020-05-31 DIAGNOSIS — F1010 Alcohol abuse, uncomplicated: Secondary | ICD-10-CM

## 2020-05-31 DIAGNOSIS — I9589 Other hypotension: Secondary | ICD-10-CM | POA: Diagnosis not present

## 2020-05-31 DIAGNOSIS — R197 Diarrhea, unspecified: Secondary | ICD-10-CM

## 2020-05-31 DIAGNOSIS — R112 Nausea with vomiting, unspecified: Secondary | ICD-10-CM | POA: Diagnosis not present

## 2020-05-31 DIAGNOSIS — E861 Hypovolemia: Secondary | ICD-10-CM | POA: Diagnosis not present

## 2020-05-31 LAB — CBC
HCT: 25.7 % — ABNORMAL LOW (ref 36.0–46.0)
Hemoglobin: 8.6 g/dL — ABNORMAL LOW (ref 12.0–15.0)
MCH: 31.3 pg (ref 26.0–34.0)
MCHC: 33.5 g/dL (ref 30.0–36.0)
MCV: 93.5 fL (ref 80.0–100.0)
Platelets: 103 10*3/uL — ABNORMAL LOW (ref 150–400)
RBC: 2.75 MIL/uL — ABNORMAL LOW (ref 3.87–5.11)
RDW: 14.8 % (ref 11.5–15.5)
WBC: 4.1 10*3/uL (ref 4.0–10.5)
nRBC: 0 % (ref 0.0–0.2)

## 2020-05-31 LAB — COMPREHENSIVE METABOLIC PANEL
ALT: 18 U/L (ref 0–44)
AST: 123 U/L — ABNORMAL HIGH (ref 15–41)
Albumin: 2.7 g/dL — ABNORMAL LOW (ref 3.5–5.0)
Alkaline Phosphatase: 58 U/L (ref 38–126)
Anion gap: 7 (ref 5–15)
BUN: 14 mg/dL (ref 6–20)
CO2: 18 mmol/L — ABNORMAL LOW (ref 22–32)
Calcium: 8 mg/dL — ABNORMAL LOW (ref 8.9–10.3)
Chloride: 111 mmol/L (ref 98–111)
Creatinine, Ser: 1.1 mg/dL — ABNORMAL HIGH (ref 0.44–1.00)
GFR, Estimated: >60 mL/min (ref >60)
Glucose, Bld: 113 mg/dL — ABNORMAL HIGH (ref 70–99)
Potassium: 3.5 mmol/L (ref 3.5–5.1)
Sodium: 136 mmol/L (ref 135–145)
Total Bilirubin: 1.4 mg/dL — ABNORMAL HIGH (ref 0.3–1.2)
Total Protein: 5 g/dL — ABNORMAL LOW (ref 6.5–8.1)

## 2020-05-31 LAB — LACTIC ACID, PLASMA: Lactic Acid, Venous: 0.9 mmol/L (ref 0.5–1.9)

## 2020-05-31 MED ORDER — FERROUS SULFATE 325 (65 FE) MG PO TABS: 325.0000 mg | ORAL_TABLET | Freq: Every day | ORAL | 0 refills | Status: DC

## 2020-05-31 MED ORDER — IBUPROFEN 600 MG PO TABS
600.0000 mg | ORAL_TABLET | Freq: Four times a day (QID) | ORAL | Status: DC | PRN
  Administered 2020-05-31: 600 mg via ORAL
  Filled 2020-05-31: qty 1

## 2020-05-31 MED ORDER — THIAMINE HCL 100 MG PO TABS: 100.0000 mg | ORAL_TABLET | Freq: Every day | ORAL | 0 refills | Status: DC

## 2020-05-31 NOTE — TOC CAGE-AID Note (Signed)
Transition of Care [facility]) - CAGE-AID Screening   Patient Details  Name: Kristin [name] MRN: [id number] Date of Birth: [birth year]  [facility]) CM/SW Contact:    Joanne Chars, LCSW Phone Number: 05/31/2020, 3:03 PM   Clinical Narrative:  CSW met with pt to complete Cage Aid.  Pt reports that she drinks 1 beer after work daily and then will drink liquor drinks with a fried on weekends, usually 2-3.  Pt denies illegal drug use, with the exception of  Taking a pain pill from a relative sporadically due to knee pain.  (No UDS in epic)  CSW discussed with pt how she would identify if her alcohol use was becoming a problem. CSW also provided contact information for outpt and residential substance use programs.      CAGE-AID Screening:    Have You Ever Felt You Ought to Cut Down on Your Drinking or Drug Use?: Yes Have People Annoyed You By Critizing Your Drinking Or Drug Use?: No Have You Felt Bad Or Guilty About Your Drinking Or Drug Use?: No Have You Ever Had a Drink or Used Drugs First Thing In The Morning to Steady Your Nerves or to Get Rid of a Hangover?: No CAGE-AID Score: 1  Substance Abuse Education Offered: Yes  Substance abuse interventions: Patient Counseling,Other (must comment)

## 2020-05-31 NOTE — Progress Notes (Signed)
Family Medicine Teaching Service Daily Progress Note Intern Pager: [phone]  Patient name: Kristin [name] Medical record number: [id number] Date of birth: August 19, 1975 Age: 45 y.o. Gender: female  Primary Care Provider: Derrel Nip, MD Consultants: GI Code Status: Full   Pt Overview and Major Events to Date:  3/20 Admitted   Assessment and Plan: Kristin [name] is a 45 y.o. female presenting with hypotension and abdominal pain . PMH is significant for IDA, migraine, hypertension, bipolar 1 disorder, GERD.  Hypotension secondary to dehydration in setting of vomiting & diarrhea, resolved BP 131/90 this am  -D/c fluids  -Zofran for nausea/vomiting  Abdominal pain  GERD Patient evaluated by GI in 2021 and EGD acute gastritis. Colonoscopy in 2021 showed internal hemorrhoids.  Patient reports chronic nausea, vomiting and diarrhea.  GI consulted and saw patient today, appreciate recommendations - f/u GI recommendations. Potential discharge today if no workup on their end.  - Zofran for nausea and emesis  - repeat EKG to monitor QTC  - continue home Protonix  - Pepcid 20mg  BID   AKI Cr 1.10 improved from 1.26 yesterday. Baseline 0.8-1. Likely pre-renal in nature given diarrhea and emesis.  - continue IVF  - monitor Cr with BMP daily  - avoid nephrotoxic agents   Anemia Hgb 8.6 down from 11.7 yesterday. Reports she used to take iron supplements for her anemia.  - continue iron supplements outpatient   Bipolar 1 disorder Home medications include Zyprexa 5 mg. -Continue Zyprexa  Hypertension BP 131/93 -Hold lisinopril-hydrochlorothiazide due to AKI and hypotension  Alcohol Use Disorder    - folic acid 1mg  daily  - thiamine 100mg  - TOC consult for alcohol cessation   FEN/GI: regular diet  Prophylaxis: SCDs  Disposition: Home, likely later today pending GI recs   Subjective:  No acute events overnight. Patient resting in bed when I came into the room. Patient  was in 10/10 pain this morning prior to my arrival. She endorsed upper back pain which she states is chronic. Denied any other complaints.   Objective: Temp:  [98.9 F (37.2 C)-99.3 F (37.4 C)] 98.9 F (37.2 C) (03/20 2218) Pulse Rate:  [77-112] 81 (03/20 2218) Resp:  [12-21] 16 (03/20 2218) BP: (98-131)/(53-99) 131/93 (03/20 2218) SpO2:  [96 %-100 %] 100 % (03/20 2218) Physical Exam: General: alert, NAD Cardiovascular: RRR no murmurs Respiratory: CTAB. Normal WOB  Abdomen: soft, non distended Extremities: warm, dry. No peripheral edema   Laboratory: Recent Labs  Lab 05/30/20 0054  WBC 9.7  HGB 11.7*  HCT 36.9  PLT 150   Recent Labs  Lab 05/30/20 0054 05/31/20 0247  NA 136 136  K 3.3* 3.5  CL 109 111  CO2 13* 18*  BUN 15 14  CREATININE 1.26* 1.10*  CALCIUM 7.8* 8.0*  PROT 6.0* 5.0*  BILITOT 0.8 1.4*  ALKPHOS 75 58  ALT 21 18  AST 215* 123*  GLUCOSE 110* 113*     Imaging/Diagnostic Tests: None new  06/01/20, DO 05/31/2020, 5:18 AM PGY-1, [facility] Family Medicine FPTS Intern pager: [phone], text pages welcome

## 2020-05-31 NOTE — Discharge Instructions (Addendum)
Kristin [name], Kristin [name] were admitted with alcohol intoxication, low blood pressures and chronic diarrhea. Your CT scan showed: signs of small bowel infection/inflammatory bowel disease. You were seen by GI who recommended imaging to check your gall bladder (HIDA scan and CT) . There have been no changes to your medications.   We recommend that you stop drinking alcohol as this has lon term health problems that impact your all parts of your body, brain,and spiritual life. Specifically this can make your blood pressure worse. You can find alcoholics anonymous in the community for free. Please go to nc23.org to find meetings or call and talk to someone.  Follow up with your PCP in 1-2 days.  Best wishes,  Family Medicine Team

## 2020-05-31 NOTE — Consult Note (Signed)
UNASSIGNED PATIENT Reason for Consult: Nausea vomiting and diarrhea. Referring Physician: Family practice teaching service.  Kristin [name] is an 45 y.o. female.  HPI: Ms. Kristin [name] is a 45 year old black female with multiple medical problems listed below, who is being discharged from the [facility] for noncompliance and has been hospitalized under the family practice teaching service for further evaluation or of her abdominal pain nausea and vomiting associated with diarrhea that she has had for several months off and on.  Patient gives a history of drinking alcohol daily daily basis along with multiple mixed drinks on the weekend.  She had a noncontrasted CT scan of the abdomen pelvis done on 05/30/2020 that revealed fluid-filled nondilated inflamed distal small bowel loops in the lower abdomen question infection versus inflammation; no colonic influenza infection or inflammation was identified.  Patient was noted to have a contracted gallbladder.  She is anxious to go home today and wants to have the rest of her work-up done on outpatient basis.  Patient had an EGD and a colonoscopy done by Dr. Bosie Clos last year which were both unrevealing.  Past Medical History:  Diagnosis Date  . Acid reflux   . ACL tear 2011   not sure which side  . Acute lateral meniscus tear of right knee   . Anxiety   . Arthritis   . Back pain    slipped disc lower back lumbar  . Bipolar 1 disorder (HCC)   . Depression   . Dysfunctional uterine bleeding   . Eczema   . Hypertension   . Insomnia   . Migraine headache   . Neuromuscular disorder (HCC)    leg cramps   Past Surgical History:  Procedure Laterality Date  . BIOPSY  09/19/2017   Procedure: BIOPSY;  Surgeon: Kathi Der, MD;  Location: [facility];  Service: Gastroenterology;;  . BIOPSY  10/15/2019   Procedure: BIOPSY;  Surgeon: Charlott Rakes, MD;  Location: [facility];  Service: Endoscopy;;  . COLONOSCOPY N/A 10/15/2019    Procedure: COLONOSCOPY;  Surgeon: Charlott Rakes, MD;  Location: [facility];  Service: Endoscopy;  Laterality: N/A;  . DILITATION & CURRETTAGE/HYSTROSCOPY WITH HYDROTHERMAL ABLATION N/A 04/04/2017   Procedure: DILATATION & CURETTAGE/HYSTEROSCOPY WITH HYDROTHERMAL ABLATION;  Surgeon: Reva Bores, MD;  Location: [facility];  Service: Gynecology;  Laterality: N/A;  . ESOPHAGOGASTRODUODENOSCOPY N/A 10/15/2019   Procedure: ESOPHAGOGASTRODUODENOSCOPY (EGD);  Surgeon: Charlott Rakes, MD;  Location: [facility];  Service: Endoscopy;  Laterality: N/A;  . ESOPHAGOGASTRODUODENOSCOPY (EGD) WITH PROPOFOL N/A 09/19/2017   Procedure: ESOPHAGOGASTRODUODENOSCOPY (EGD) WITH PROPOFOL;  Surgeon: Kathi Der, MD;  Location: [facility];  Service: Gastroenterology;  Laterality: N/A;  . KNEE ARTHROSCOPY WITH LATERAL MENISECTOMY Right 09/30/2019   Procedure: KNEE ARTHROSCOPY WITH LATERAL MENISECTOMY;  Surgeon: Sheral Apley, MD;  Location: [facility];  Service: Orthopedics;  Laterality: Right;  . TUBAL LIGATION  yrs ago   Family History  Problem Relation Age of Onset  . Breast cancer Maternal Grandmother   . Hypertension Mother   . Hypertension Brother   . Colon cancer Maternal Uncle   . Bipolar disorder Cousin   . Schizophrenia Cousin    Social History:  reports that she has been smoking cigarettes. She has a 1.25 pack-year smoking history. She has never used smokeless tobacco. She reports current alcohol use. She reports that she does not use drugs.  Allergies:  Allergies  Allergen Reactions  . Sumatriptan Anaphylaxis    High blood pressure and numb  .  Other     Bananas-stomach cramps  . Eggs Or Egg-Derived Products Rash and Other (See Comments)    Stomach cramps   Medications: I have reviewed the patient's current medications.  Results for orders placed or performed during the hospital encounter of 05/30/20 (from the past 48 hour(s))  CBC with  Differential/Platelet     Status: Abnormal   Collection Time: 05/30/20 12:54 AM  Result Value Ref Range   WBC 9.7 4.0 - 10.5 K/uL   RBC 3.82 (L) 3.87 - 5.11 MIL/uL   Hemoglobin 11.7 (L) 12.0 - 15.0 g/dL   HCT 08.6 57.8 - 46.9 %   MCV 96.6 80.0 - 100.0 fL   MCH 30.6 26.0 - 34.0 pg   MCHC 31.7 30.0 - 36.0 g/dL   RDW 62.9 52.8 - 41.3 %   Platelets 150 150 - 400 K/uL   nRBC 0.0 0.0 - 0.2 %   Neutrophils Relative % 79 %   Neutro Abs 7.7 1.7 - 7.7 K/uL   Lymphocytes Relative 12 %   Lymphs Abs 1.2 0.7 - 4.0 K/uL   Monocytes Relative 7 %   Monocytes Absolute 0.6 0.1 - 1.0 K/uL   Eosinophils Relative 1 %   Eosinophils Absolute 0.1 0.0 - 0.5 K/uL   Basophils Relative 1 %   Basophils Absolute 0.1 0.0 - 0.1 K/uL   Immature Granulocytes 0 %   Abs Immature Granulocytes 0.04 0.00 - 0.07 K/uL    Comment: Performed at [facility], [address], [place], [place] [address]  Comprehensive metabolic panel     Status: Abnormal   Collection Time: 05/30/20 12:54 AM  Result Value Ref Range   Sodium 136 135 - 145 mmol/L   Potassium 3.3 (L) 3.5 - 5.1 mmol/L   Chloride 109 98 - 111 mmol/L   CO2 13 (L) 22 - 32 mmol/L   Glucose, Bld 110 (H) 70 - 99 mg/dL    Comment: Glucose reference range applies only to samples taken after fasting for at least 8 hours.   BUN 15 6 - 20 mg/dL   Creatinine, Ser 0.27 (H) 0.44 - 1.00 mg/dL   Calcium 7.8 (L) 8.9 - 10.3 mg/dL   Total Protein 6.0 (L) 6.5 - 8.1 g/dL   Albumin 3.3 (L) 3.5 - 5.0 g/dL   AST 253 (H) 15 - 41 U/L   ALT 21 0 - 44 U/L   Alkaline Phosphatase 75 38 - 126 U/L   Total Bilirubin 0.8 0.3 - 1.2 mg/dL   GFR, Estimated 53 (L) >60 mL/min    Comment: (NOTE) Calculated using the CKD-EPI Creatinine Equation (2021)    Anion gap 14 5 - 15    Comment: Performed at [facility], [address], [place], [place] [address]  Lipase, blood     Status: Abnormal   Collection Time: 05/30/20 12:54 AM  Result Value Ref Range   Lipase 128 (H) 11 - 51  U/L    Comment: Performed at [facility], [address] [place], [place] [address]  Troponin I (High Sensitivity)     Status: None   Collection Time: 05/30/20 12:54 AM  Result Value Ref Range   Troponin I (High Sensitivity) 5 <18 ng/L    Comment: (NOTE) Elevated high sensitivity troponin I (hsTnI) values and significant  changes across serial measurements may suggest ACS but many other  chronic and acute conditions are known to elevate hsTnI results.  Refer to the "Links" section for  chest pain algorithms and additional  guidance. Performed at [facility], [address], [place], [place] [address]   POC occult blood, ED     Status: Abnormal   Collection Time: 05/30/20  1:14 AM  Result Value Ref Range   Fecal Occult Bld POSITIVE (A) NEGATIVE  Type and screen [facility]     Status: None   Collection Time: 05/30/20  3:03 AM  Result Value Ref Range   ABO/RH(D) O POS    Antibody Screen NEG    Sample Expiration      06/02/2020,2359 Performed at [facility], [address], [place], [place] [address]   Ethanol     Status: Abnormal   Collection Time: 05/30/20  3:15 AM  Result Value Ref Range   Alcohol, Ethyl (B) 199 (H) <10 mg/dL    Comment: (NOTE) Lowest detectable limit for serum alcohol is 10 mg/dL.  For medical purposes only. Performed at [facility], [address] [place], [place] [address]   Acetaminophen level     Status: Abnormal   Collection Time: 05/30/20  3:15 AM  Result Value Ref Range   Acetaminophen (Tylenol), Serum <10 (L) 10 - 30 ug/mL    Comment: (NOTE) Therapeutic concentrations vary significantly. A range of 10-30 ug/mL  may be an effective concentration for many patients. However, some  are best treated at concentrations outside of this range. Acetaminophen concentrations >150 ug/mL at 4 hours after ingestion  and >50 ug/mL at 12 hours after ingestion are often associated with  toxic  reactions.  Performed at [facility], [address], [place], [place] [address]   Salicylate level     Status: Abnormal   Collection Time: 05/30/20  3:15 AM  Result Value Ref Range   Salicylate Lvl <7.0 (L) 7.0 - 30.0 mg/dL    Comment: Performed at [facility], [address] [place], [place] [address]  Lactic acid, plasma     Status: Abnormal   Collection Time: 05/30/20  6:30 AM  Result Value Ref Range   Lactic Acid, Venous 4.5 (HH) 0.5 - 1.9 mmol/L    Comment: CRITICAL RESULT CALLED TO, READ BACK BY AND VERIFIED WITH: CAMRY COGAN [email or web address]  ON 3.20.22 BY TCALDWELL MT. Performed at [facility], [address], [place], [place] [address]   Troponin I (High Sensitivity)     Status: None   Collection Time: 05/30/20  6:30 AM  Result Value Ref Range   Troponin I (High Sensitivity) 3 <18 ng/L    Comment: (NOTE) Elevated high sensitivity troponin I (hsTnI) values and significant  changes across serial measurements may suggest ACS but many other  chronic and acute conditions are known to elevate hsTnI results.  Refer to the "Links" section for chest pain algorithms and additional  guidance. Performed at [facility], [address], [place], [place] [address]   Resp Panel by RT-PCR (Flu A&B, Covid) Nasopharyngeal Swab     Status: None   Collection Time: 05/30/20  6:44 AM   Specimen: Nasopharyngeal Swab; Nasopharyngeal(NP) swabs in vial transport medium  Result Value Ref Range   SARS Coronavirus 2 by RT PCR NEGATIVE NEGATIVE    Comment: (NOTE) SARS-CoV-2 target nucleic acids are NOT DETECTED.  The SARS-CoV-2 RNA is generally detectable in upper respiratory specimens during the acute phase of infection. The lowest concentration of SARS-CoV-2 viral copies this assay can detect is 138 copies/mL. A negative result  does not preclude SARS-Cov-2 infection and should not be used as the sole basis for treatment or other patient management decisions. A negative  result may occur with  improper specimen collection/handling, submission of specimen other than nasopharyngeal swab, presence of viral mutation(s) within the areas targeted by this assay, and inadequate number of viral copies(<138 copies/mL). A negative result must be combined with clinical observations, patient history, and epidemiological information. The expected result is Negative.  Fact Sheet for Patients:  BloggerCourse.com  Fact Sheet for Healthcare Providers:  SeriousBroker.it  This test is no t yet approved or cleared by the [place] FDA and  has been authorized for detection and/or diagnosis of SARS-CoV-2 by FDA under an Emergency Use Authorization (EUA). This EUA will remain  in effect (meaning this test can be used) for the duration of the COVID-19 declaration under Section 564(b)(1) of the Act, 21 U.S.C.section 360bbb-3(b)(1), unless the authorization is terminated  or revoked sooner.       Influenza A by PCR NEGATIVE NEGATIVE   Influenza B by PCR NEGATIVE NEGATIVE    Comment: (NOTE) The Xpert Xpress SARS-CoV-2/FLU/RSV plus assay is intended as an aid in the diagnosis of influenza from Nasopharyngeal swab specimens and should not be used as a sole basis for treatment. Nasal washings and aspirates are unacceptable for Xpert Xpress SARS-CoV-2/FLU/RSV testing.  Fact Sheet for Patients: BloggerCourse.com  Fact Sheet for Healthcare Providers: SeriousBroker.it  This test is not yet approved or cleared by the [place] FDA and has been authorized for detection and/or diagnosis of SARS-CoV-2 by FDA under an Emergency Use Authorization (EUA). This EUA will remain in effect (meaning this test can be used) for the duration of the COVID-19 declaration under Section 564(b)(1) of the Act, 21 U.S.C. section 360bbb-3(b)(1), unless the authorization is terminated  or revoked.  Performed at [facility], [address], [place], [place] [address]   Lactic acid, plasma     Status: Abnormal   Collection Time: 05/30/20  9:22 AM  Result Value Ref Range   Lactic Acid, Venous 2.9 (HH) 0.5 - 1.9 mmol/L    Comment: CRITICAL VALUE NOTED.  VALUE IS CONSISTENT WITH PREVIOUSLY REPORTED AND CALLED VALUE. Performed at [facility], [address], [place], [place] [address]   Comprehensive metabolic panel     Status: Abnormal   Collection Time: 05/31/20  2:47 AM  Result Value Ref Range   Sodium 136 135 - 145 mmol/L   Potassium 3.5 3.5 - 5.1 mmol/L    Comment: SLIGHT HEMOLYSIS   Chloride 111 98 - 111 mmol/L   CO2 18 (L) 22 - 32 mmol/L   Glucose, Bld 113 (H) 70 - 99 mg/dL    Comment: Glucose reference range applies only to samples taken after fasting for at least 8 hours.   BUN 14 6 - 20 mg/dL   Creatinine, Ser 0.98 (H) 0.44 - 1.00 mg/dL   Calcium 8.0 (L) 8.9 - 10.3 mg/dL   Total Protein 5.0 (L) 6.5 - 8.1 g/dL   Albumin 2.7 (L) 3.5 - 5.0 g/dL   AST 119 (H) 15 - 41 U/L   ALT 18 0 - 44 U/L   Alkaline Phosphatase 58 38 - 126 U/L   Total Bilirubin 1.4 (H) 0.3 - 1.2 mg/dL   GFR, Estimated >14 >78 mL/min    Comment: (NOTE) Calculated using the CKD-EPI Creatinine Equation (2021)    Anion gap 7 5 - 15    Comment: Performed at [facility], [address] [place], [place] [address]  CBC     Status: Abnormal   Collection Time: 05/31/20  2:47 AM  Result Value Ref Range   WBC 4.1 4.0 - 10.5 K/uL   RBC 2.75 (L) 3.87 - 5.11 MIL/uL   Hemoglobin 8.6 (L) 12.0 - 15.0 g/dL    Comment: REPEATED TO VERIFY   HCT 25.7 (L) 36.0 - 46.0 %   MCV 93.5 80.0 - 100.0 fL   MCH 31.3 26.0 - 34.0 pg   MCHC 33.5 30.0 - 36.0 g/dL   RDW 66.2 94.7 - 65.4 %   Platelets 103 (L) 150 - 400 K/uL    Comment: Immature Platelet Fraction may be clinically indicated, consider ordering this additional test [id number] REPEATED TO VERIFY PLATELET COUNT CONFIRMED BY  SMEAR    nRBC 0.0 0.0 - 0.2 %    Comment: Performed at [facility], [address] [place], [place] [address]  Lactic acid, plasma     Status: None   Collection Time: 05/31/20  7:59 AM  Result Value Ref Range   Lactic Acid, Venous 0.9 0.5 - 1.9 mmol/L    Comment: Performed at [facility], [address] [place], [place] [address]   CT ABDOMEN PELVIS WO CONTRAST  Result Date: 05/30/2020 CLINICAL DATA:  45 year old female with abdominal pain. Vomiting and diarrhea. EXAM: CT ABDOMEN AND PELVIS WITHOUT CONTRAST TECHNIQUE: Multidetector CT imaging of the abdomen and pelvis was performed following the standard protocol without IV contrast. COMPARISON:  CT Abdomen and Pelvis 10/11/2019 and earlier. FINDINGS: Lower chest: Larger lung volumes from July. Lung bases are clear. No cardiomegaly. No pericardial or pleural effusion. Hepatobiliary: Contracted gallbladder.  Negative noncontrast liver. Pancreas: Negative noncontrast pancreas. Spleen: Negative. Adrenals/Urinary Tract: Negative. No nephrolithiasis or hydronephrosis. Decompressed bladder. Stomach/Bowel: Fluid throughout nondilated large bowel to the rectum. Mildly redundant sigmoid. No definite large bowel inflammation today. Appendix is normal on coronal image 55. Mildly thickened and indistinct distal small bowel loops in the lower abdomen and pelvis. See coronal image 50. Mild associated lower small bowel mesenteric stranding. The most abnormal loops are not abnormally dilated (series 3, image 78). The abnormality abates at the terminal ileum. No upstream dilated small bowel, although much of the small bowel is fluid-filled. No free air. No free fluid. No other areas of small bowel mesenteric inflammation. Stomach and duodenum appear negative. A small fat containing umbilical hernia is noted on series 3, image 40 with no complicating features. Vascular/Lymphatic: Normal caliber abdominal aorta. Minimal calcified atherosclerosis. Vascular  patency is not evaluated in the absence of IV contrast. No lymphadenopathy. Reproductive: Negative noncontrast appearance. Other: No pelvic free fluid. Musculoskeletal: Negative aside from some lower thoracic costovertebral junction degeneration. IMPRESSION: 1. Fluid-filled and inflamed but nondilated distal small bowel loops in the bilateral lower abdomen. Top differential considerations are small bowel infection, inflammatory bowel disease. Correlation with serum lactate may be valuable although bowel ischemia is felt unlikely in this case. No associated bowel obstruction. 2. No definite large bowel inflammation although fluid throughout the large bowel to the rectum is compatible with diarrhea. Normal appendix. Electronically Signed   By: Odessa Fleming M.D.   On: 05/30/2020 06:16   DG Chest Portable 1 View  Result Date: 05/30/2020 CLINICAL DATA:  45 year old female with vomiting. EXAM: PORTABLE CHEST 1 VIEW COMPARISON:  Chest radiograph dated 10/10/2019. FINDINGS: No focal consolidation, pleural effusion, pneumothorax. Top-normal cardiac size. No acute osseous pathology. IMPRESSION: No active disease.  Electronically Signed   By: Elgie CollardArash  Radparvar M.D.   On: 05/30/2020 03:21   DG Abd Portable 2 Views  Result Date: 05/30/2020 CLINICAL DATA:  45 year old female with vomiting. EXAM: PORTABLE ABDOMEN - 2 VIEW COMPARISON:  Abdominal radiograph dated 10/10/2019. FINDINGS: No bowel dilatation or evidence of obstruction. No free air or radiopaque calculi. The osseous structures are intact. IMPRESSION: Negative. Electronically Signed   By: Elgie CollardArash  Radparvar M.D.   On: 05/30/2020 03:21   Review of Systems Blood pressure (!) 131/93, pulse 81, temperature 98.9 F (37.2 C), temperature source Oral, resp. rate 16, height 5\' 3"  (1.6 m), weight 77.6 kg, SpO2 100 %. Physical Exam Constitutional:      General: She is not in acute distress.    Appearance: Normal appearance. She is diaphoretic. She is not ill-appearing or  toxic-appearing.  HENT:     Head: Normocephalic and atraumatic.     Mouth/Throat:     Mouth: Mucous membranes are dry.  Eyes:     Extraocular Movements: Extraocular movements intact.     Pupils: Pupils are equal, round, and reactive to light.  Cardiovascular:     Rate and Rhythm: Normal rate and regular rhythm.  Pulmonary:     Effort: Pulmonary effort is normal.     Breath sounds: Normal breath sounds.  Abdominal:     General: Bowel sounds are normal. There is no distension.     Palpations: Abdomen is soft. There is no mass.     Tenderness: There is no abdominal tenderness. There is no guarding.     Hernia: No hernia is present.  Musculoskeletal:     Cervical back: Normal range of motion and neck supple.  Skin:    General: Skin is warm.  Neurological:     General: No focal deficit present.     Mental Status: She is alert and oriented to person, place, and time.  Psychiatric:        Mood and Affect: Mood normal.        Behavior: Behavior normal.        Thought Content: Thought content normal.        Judgment: Judgment normal.   Assessment/Plan: 1) Abdominal pain with nausea and vomiting/elevated liver enzymes-contracted gallbladder noted on a noncontrasted CT. I think the patient would benefit from a right upper quadrant ultrasound and HIDA scan with CCK injection rule out biliary dyskinesia as a suspect she may have gallbladder disease. 2) Abnormal CT scan with enteritis/IDA-?infectious versus inflammatory-in view of the patient's ongoing problems with diarrhea a CT enterography might be helpful with contrast to make sure she does not have inflammatory small bowel disease.  I also had a discussion with the patient about abstinence from the use of alcohol as this in itself can cause diarrhea. Avoidance of all artificial sugars has been encouraged as well as this can cause an osmotic diarrhea. Additionally the use of PPIs, nonsteroidals and cholesterol-lowering agents can reduce the  microscopic colitis. The patient did not have random colonic biopsies done during her colonoscopy which may have helped to make this diagnosis. 3) GERD. 4) AKI. :Charna ElizabethJyothi Jozlynn Plaia, MD 05/31/2020, 2:40 PM

## 2020-05-31 NOTE — Progress Notes (Signed)
Patient discharged to home, IV removal well tolerated, instructions reviewed with patient, understanding expressed. Transported by family car, stated she had all of her belongings

## 2020-06-01 NOTE — Patient Instructions (Incomplete)
It was great to see you! Thank you for allowing me to participate in your care!  I recommend that you always bring your medications to each appointment as this makes it easy to ensure we are on the correct medications and helps us not miss when refills are needed.  Our plans for today:  - *** -   We are checking some labs today, I will call you if they are abnormal will send you a MyChart message or a letter if they are normal.  If you do not hear about your labs in the next 2 weeks please let us know.***  Take care and seek immediate care sooner if you develop any concerns.   Dr. Ryan Welborn, DO [facility]  

## 2020-06-01 NOTE — Progress Notes (Deleted)
    SUBJECTIVE:   CHIEF COMPLAINT / HPI:   Hospital follow-up: Patient is a 45 year old female who presents today for follow-up from hospitalization which to was due to dehydration, hypotension, and AKI in the setting of diarrhea and vomiting.  The patient had arrived to the emergency department with an ethanol level of 199 and lactic acid initially elevated which quickly decreased with fluid resuscitation.  She was also hypotensive in the 70s and 80s which improved with aggressive IV fluid totaling 4 L normal saline boluses in the emergency department.  During her hospitalization she did have lipase level elevated to 128 and CT abdomen pelvis was performed with fluid-filled and inflamed but nondilated distal small bowel loops in the bilateral lower abdomen concerning for small bowel infection versus IBD.  GI was consulted recommended follow-up with HIDA scan and CTA.  Recommendations for follow-up per the discharge summary include: -Monitoring patient's blood pressure as her lisinopril-hydrochlorothiazide was held due to her hypotension and AKI, recommendation is that we can restart this if BP is normalized.-The patient was started on p.o. iron at discharge with recommendation to see how she is tolerating this. -Recommend discussion of alcohol cessation and providing resources for this -Recommend patient continue follow-up with GI to get her HIDA scan and CT angiography.  PERTINENT  PMH / PSH: ***  OBJECTIVE:   There were no vitals taken for this visit.   General: NAD, pleasant, able to participate in exam Cardiac: RRR, no murmurs. Respiratory: CTAB, normal effort, No wheezes, rales or rhonchi Abdomen: Bowel sounds present, nontender, nondistended, no hepatosplenomegaly. Extremities: no edema or cyanosis. Skin: warm and dry, no rashes noted Neuro: alert, no obvious focal deficits Psych: Normal affect and mood  ASSESSMENT/PLAN:   No problem-specific Assessment & Plan notes found for  this encounter.     Jackelyn Poling, DO  [facility]    This note was prepared using Dragon voice recognition software and may include unintentional dictation errors due to the inherent limitations of voice recognition software.  {    This will disappear when note is signed, click to select method of visit    :1}

## 2020-06-02 ENCOUNTER — Ambulatory Visit: Payer: 59 | Admitting: Family Medicine

## 2020-06-11 ENCOUNTER — Ambulatory Visit (INDEPENDENT_AMBULATORY_CARE_PROVIDER_SITE_OTHER): Payer: 59 | Admitting: Family Medicine

## 2020-06-11 ENCOUNTER — Encounter: Payer: Self-pay | Admitting: Family Medicine

## 2020-06-11 ENCOUNTER — Other Ambulatory Visit: Payer: Self-pay

## 2020-06-11 VITALS — BP 130/80 | HR 97 | Resp 14 | Wt 179.0 lb

## 2020-06-11 DIAGNOSIS — N179 Acute kidney failure, unspecified: Secondary | ICD-10-CM

## 2020-06-11 DIAGNOSIS — I10 Essential (primary) hypertension: Secondary | ICD-10-CM

## 2020-06-11 DIAGNOSIS — R300 Dysuria: Secondary | ICD-10-CM

## 2020-06-11 DIAGNOSIS — R609 Edema, unspecified: Secondary | ICD-10-CM

## 2020-06-11 DIAGNOSIS — N390 Urinary tract infection, site not specified: Secondary | ICD-10-CM

## 2020-06-11 DIAGNOSIS — F319 Bipolar disorder, unspecified: Secondary | ICD-10-CM

## 2020-06-11 LAB — POCT URINALYSIS DIP (MANUAL ENTRY)
Bilirubin, UA: NEGATIVE
Glucose, UA: NEGATIVE mg/dL
Nitrite, UA: POSITIVE — AB
Protein Ur, POC: 30 mg/dL — AB
Spec Grav, UA: 1.015 (ref 1.010–1.025)
Urobilinogen, UA: 0.2 E.U./dL
pH, UA: 6 (ref 5.0–8.0)

## 2020-06-11 LAB — POCT UA - MICROSCOPIC ONLY

## 2020-06-11 MED ORDER — CEPHALEXIN 500 MG PO CAPS
500.0000 mg | ORAL_CAPSULE | Freq: Two times a day (BID) | ORAL | 0 refills | Status: DC
Start: 2020-06-11 — End: 2020-08-01

## 2020-06-11 NOTE — Patient Instructions (Addendum)
It was wonderful to see you today.  Please bring ALL of your medications with you to every visit.   Today we talked about:  --Going to Urology about your multiple infections   -- Checking  Your kidney function  -- AVOID NSAIDs as much as possible  - If your leg swelling worsens please schedule an appointment next week   Follow up with your primary doctor    Thank you for choosing [facility].   Please call [phone] with any questions about today's appointment.  Please be sure to schedule follow up at the front  desk before you leave today.   [name], MD  Family Medicine      Your symptoms are consistent with a UTI (aka "acute cystitis")  . Urine Cultures have been sent. If the results come back with a different bacterial strain than suspected, I will call you and send the appropriate medication to your pharmacy.  . Drink plenty of fluids!  . If symptoms worsen or your do not experience relief in 3-4 days. Please call to be seen by a [facility] provider.   UTI Prevention: . Drink a minimum of 64oz of water/fluid a day . Take a lactobacillus probiotic once a day, and twice a day if taking antibiotics . It is important to have at least one soft bowel movement a day. You can achieve this with adequate fiber intake and Miralax  . Can try Cranberry tablets: two tablets twice a day . Avoid antibacterial soaps or wipes . Try to empty bladder shortly before and after sex, use water based lubrication during intercourse  . Do NOT douche or use feminine deodorants . Change tampons and feminine pads often and wash genital area with warm water before sex  . Take showers instead of baths, avoid tight fitting clothing and panty hose, and wear cotton underwear

## 2020-06-11 NOTE — Progress Notes (Signed)
SUBJECTIVE:   CHIEF COMPLAINT / HPI:   Kristin [name] is a pleasant 45 year old woman with history significant for hypertension, mood disorder, tobacco abuse and recurrent urinary tract infections presenting today for routine follow-up.  The patient reports a number of stressors.  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  She was recently admitted to the hospital.  She reports several family stressors at home.  She reports a low mood but denies thoughts of hurting herself or others.  The patient's primary concern today is her nerves her back pain and her urinary symptoms we agreed to focus on her urine symptoms primarily today and schedule close follow-up with her primary care physician at regular intervals to help her address her other concerns.   The patient reports several days of malodorous urine, dysuria and intermittent low back pain.  She reports urinary frequency she reports these are characteristic of her urinary tract infections she is uncertain why she is getting these.  She wipes properly from front to back.  She usually has diarrhea rather than constipation.  She is sexual active with 1 partner and has not been sexual active in 2 months.  She does not think that this is what triggers her symptoms.  She reports completion of the antibiotic courses.  Reviewed prior urinary tract infection cultures.  Since November she has had 4 E. coli urinary tract infections all of which had similar resistance patterns to ampicillin and trimethoprim.  Some are also resistant to nitrofurantoin.  She has no antibiotic allergies.  She denies vaginal discharge or hematuria.  She denies fevers or vomiting.  The patient reports chronic intermittent lower extremity swelling.  She reports she urinates pretty frequently.  She does use excess NSAIDs as she has chronic headache she reports.  These are usually triggered when she is stressed.  Medications reviewed.  She only has HCTZ  and lisinopril combination and naproxen in her bag.  Gabapentin is on the list as well as Zyprexa she reports she is taking each she will bring all of her medications to follow-up to review in length.   PERTINENT  PMH / PSH/Family/Social History : Updated and reviewed as appropriate of note she is due for hepatitis C screening a number of other healthcare maintenance items  OBJECTIVE:   BP 130/80   Pulse 97   Resp 14   Wt 179 lb (81.2 kg)   SpO2 97%   BMI 31.71 kg/m   Today's weight:  Last Weight  Most recent update: 06/11/2020  3:38 PM   Weight  81.2 kg (179 lb)           Review of prior weights: [employer]   06/11/20 1538  Weight: 179 lb (81.2 kg)    Cardiac: Regular rate and rhythm. Normal S1/S2. No murmurs, rubs, or gallops appreciated. Lungs: Clear bilaterally to ascultation.  Abdomen: Normoactive bowel sounds. No tenderness to deep or light palpation. No rebound or guarding.  Minimal suprapubic tenderness  Psych: Pleasant and appropriate   No calf edema.  No calf tenderness.  She has mild swelling of the bilateral ankles.    ASSESSMENT/PLAN:   Recurrent UTI  Unclear trigger, multiple episodes she may have a underlying anatomic cause of this.  Do not suspect STI at this time given absence of vaginal discharge. Has tubal ligation, not currently pregnant.  Given prior resistance patterns will empirically treat with cephalexin although I discussed with her that it would be best  to wait for the urine culture.  Given recurrence will refer to urology.  We appreciate their guidance and input for this patient with recurrent urinary tract infections.   Bipolar 1 disorder Recommend referral to chronic care management and possible referral to psychiatry at PCP visit.  Patient denies SI/HI. Review use of Zyprexa at follow up (reports taking, not with her today)--she will bring bottles.    Bilateral Ankle Edema suspect this is actually due to her socks which are very tight in  nature and compression socks.  This is a chronic problem for many years.  Do not suspect DVT as this is bilateral in nature.  This is also localized entirely ankle.  Unclear she if is taking gabapentin does not have it with her today---this certainly could contribute.  Other etiologies to consider include nephrotic syndrome and hepatic disease given her history of alcohol use will evaluate for both of these today.  Repeat BMP given recent AKI.  HCM Hep C antibody at follow up (reviewed Pap guidelines--ASCUS and HPV negative then HPV negative and normal cytology--> 5 years per [facility])   At PCP Visit:  Review meds--she is to bring them  Discuss therapy and psychiatry again  At follow up--discuss referral to Gastroenterology (evauluated by Dr. Loreta Ave in the hospital, may need HIDA in future)     [name], MD  Family Medicine Teaching Service  [facility]

## 2020-06-11 NOTE — Assessment & Plan Note (Signed)
Recommend referral to chronic care management and possible referral to psychiatry at PCP visit.  Patient denies SI/HI. Review use of Zyprexa at follow up (reports taking, not with her today)--she will bring bottles.

## 2020-06-11 NOTE — Assessment & Plan Note (Signed)
  Unclear trigger, multiple episodes she may have a underlying anatomic cause of this.  Do not suspect STI at this time given absence of vaginal discharge. Has tubal ligation, not currently pregnant.  Given prior resistance patterns will empirically treat with cephalexin although I discussed with her that it would be best to wait for the urine culture.  Given recurrence will refer to urology.  We appreciate their guidance and input for this patient with recurrent urinary tract infections.

## 2020-06-12 LAB — BASIC METABOLIC PANEL
BUN/Creatinine Ratio: 18 (ref 9–23)
BUN: 10 mg/dL (ref 6–24)
CO2: 22 mmol/L (ref 20–29)
Calcium: 8.6 mg/dL — ABNORMAL LOW (ref 8.7–10.2)
Chloride: 103 mmol/L (ref 96–106)
Creatinine, Ser: 0.57 mg/dL (ref 0.57–1.00)
Glucose: 89 mg/dL (ref 65–99)
Potassium: 3.5 mmol/L (ref 3.5–5.2)
Sodium: 143 mmol/L (ref 134–144)
eGFR: 113 mL/min/{1.73_m2} (ref >59)

## 2020-06-12 LAB — HEPATIC FUNCTION PANEL
ALT: 8 IU/L (ref 0–32)
AST: 39 IU/L (ref 0–40)
Albumin: 4 g/dL (ref 3.8–4.8)
Alkaline Phosphatase: 104 IU/L (ref 44–121)
Bilirubin Total: 0.3 mg/dL (ref 0.0–1.2)
Bilirubin, Direct: 0.12 mg/dL (ref 0.00–0.40)
Total Protein: 6.5 g/dL (ref 6.0–8.5)

## 2020-06-12 LAB — PROTEIN / CREATININE RATIO, URINE
Creatinine, Urine: 46.8 mg/dL
Protein, Ur: 21.9 mg/dL
Protein/Creat Ratio: 468 mg/g creat — ABNORMAL HIGH (ref 0–200)

## 2020-06-14 ENCOUNTER — Ambulatory Visit: Payer: 59

## 2020-06-14 LAB — URINE CULTURE

## 2020-06-29 ENCOUNTER — Ambulatory Visit: Payer: 59

## 2020-07-06 ENCOUNTER — Ambulatory Visit: Payer: 59

## 2020-07-07 ENCOUNTER — Ambulatory Visit (INDEPENDENT_AMBULATORY_CARE_PROVIDER_SITE_OTHER): Payer: 59

## 2020-07-07 ENCOUNTER — Other Ambulatory Visit: Payer: Self-pay

## 2020-07-07 DIAGNOSIS — Z111 Encounter for screening for respiratory tuberculosis: Secondary | ICD-10-CM | POA: Diagnosis not present

## 2020-07-07 NOTE — Progress Notes (Signed)
Tuberculin skin test applied to left ventral forearm.  Patient to return on 4/29 to have site read.  Reminder card given to patient.

## 2020-07-09 ENCOUNTER — Ambulatory Visit: Payer: 59

## 2020-07-09 ENCOUNTER — Other Ambulatory Visit: Payer: Self-pay

## 2020-07-09 DIAGNOSIS — Z111 Encounter for screening for respiratory tuberculosis: Secondary | ICD-10-CM

## 2020-07-09 LAB — TB SKIN TEST
Induration: 0 mm
TB Skin Test: NEGATIVE

## 2020-07-09 NOTE — Progress Notes (Signed)
PPD Reading Note PPD read and results entered in EpicCare. Result: 0 mm induration. Interpretation: Negative Allergic reaction: No  

## 2020-07-12 ENCOUNTER — Ambulatory Visit: Payer: 59 | Admitting: Family Medicine

## 2020-07-12 ENCOUNTER — Encounter: Payer: Self-pay | Admitting: Family Medicine

## 2020-07-12 ENCOUNTER — Other Ambulatory Visit: Payer: Self-pay

## 2020-07-12 VITALS — BP 138/78 | HR 74 | Ht 63.0 in | Wt 172.0 lb

## 2020-07-12 DIAGNOSIS — G4700 Insomnia, unspecified: Secondary | ICD-10-CM

## 2020-07-12 DIAGNOSIS — G8929 Other chronic pain: Secondary | ICD-10-CM | POA: Diagnosis not present

## 2020-07-12 DIAGNOSIS — M1731 Unilateral post-traumatic osteoarthritis, right knee: Secondary | ICD-10-CM | POA: Diagnosis not present

## 2020-07-12 DIAGNOSIS — M25561 Pain in right knee: Secondary | ICD-10-CM | POA: Diagnosis not present

## 2020-07-12 DIAGNOSIS — R8290 Unspecified abnormal findings in urine: Secondary | ICD-10-CM | POA: Diagnosis not present

## 2020-07-12 LAB — POCT URINALYSIS DIP (MANUAL ENTRY)
Bilirubin, UA: NEGATIVE
Glucose, UA: NEGATIVE mg/dL
Nitrite, UA: POSITIVE — AB
Protein Ur, POC: 30 mg/dL — AB
Spec Grav, UA: >=1.03 — AB (ref 1.010–1.025)
Urobilinogen, UA: 0.2 E.U./dL
pH, UA: 5.5 (ref 5.0–8.0)

## 2020-07-12 MED ORDER — TRAZODONE HCL 100 MG PO TABS: 50.0000 mg | ORAL_TABLET | Freq: Every evening | ORAL | 0 refills | Status: DC | PRN

## 2020-07-12 MED ORDER — METHYLPREDNISOLONE ACETATE 40 MG/ML IJ SUSP: 40.0000 mg | Freq: Once | INTRAMUSCULAR | Status: DC

## 2020-07-12 MED ORDER — CEPHALEXIN 500 MG PO CAPS: 500.0000 mg | ORAL_CAPSULE | Freq: Four times a day (QID) | ORAL | 0 refills | Status: DC

## 2020-07-12 MED ORDER — METHYLPREDNISOLONE ACETATE 40 MG/ML IJ SUSP
40.0000 mg | Freq: Once | INTRAMUSCULAR | Status: AC
  Administered 2020-07-12: 40 mg via INTRA_ARTICULAR

## 2020-07-12 NOTE — Progress Notes (Signed)
    SUBJECTIVE:   CHIEF COMPLAINT / HPI:   UTI symptoms Patient reports that she has had a longstanding history of recurrent UTIs.  Most recent UTI was approximately 1 month ago and was treated with Keflex.  She reports that the symptoms of her prior UTI have returned over the past few weeks.  She was previously prescribed Keflex which she took twice daily for 5 days.  Is unsure of why she is having so many UTIs.  Would like treatment today and we will investigate prophylaxis in the future.  Knee pain Patient reports chronic history of osteoarthritis of both knees.  Her right knee is bothering her the most at this time.  She reports that she has had knee injections in the past and they helped greatly.  She is also had a "cleanout" of her right knee and feels like the pain has worsened since the cleanout.  Sleep issues Patient reports longstanding history of issues with sleep for which she was prescribed trazodone in the past but she is run out of the prescription.  She is trying melatonin with little success at this time.  She does not practice healthy sleep habits and that she watches TV while laying in bed.  She would like a refill on her trazodone. OBJECTIVE:   BP 138/78   Pulse 74   Ht 5\' 3"  (1.6 m)   Wt 172 lb (78 kg)   SpO2 98%   BMI 30.47 kg/m   General: Well-appearing 45 year old female in no acute distress Cardiac: Regular rate and rhythm, no murmur appreciated Respiratory: Normal work of breathing, lungs clear to auscultation bilaterally, speaking full sentences MSK: Patient with swelling of knees bilaterally, significant crepitus noted in right knee, positive anterior drawer which patient reports she tore her ACL in the past.  After informed written consent timeout was performed, patient was lying supine on exam table.  Right knee was prepped with alcohol swab.  Utilizing inferior lateral approach the right knee was then injected with 3:1 lidocaine:depomedrol.  Patient  tolerated procedure well without immediate complications   ASSESSMENT/PLAN:   Osteoarthritis of right knee Chronic issue, patient notes that she needs to see an Ortho.  Stable by knee injection today.  Injection was given today and patient tolerated well.  We will follow-up as needed.  Abnormal urine odor Patient continues to have issues with recurrent UTIs.  We will change her treatment to a more frequent dosage and see if this helps eliminate the UTI.  Keflex 500 mg 4 times daily prescribed for 5 days.  We discussed hygiene and patient will keep track of when the symptoms resolve.  I collected urine culture and we will call the patient if we need to change antibiotics.  Strict ED and return precautions given.  Insomnia Patient reports continued issues with sleeping.  She was on trazodone in the past and would like a refill on the prescription.  Refill sent to patient's pharmacy.     47, MD [facility]

## 2020-07-12 NOTE — Patient Instructions (Signed)
It was wonderful seeing you this afternoon.  I am sorry you are having UTI symptoms again.  We collected a urine sample and I have sent it for culture.  I sent in a prescription for Keflex which you will take 4 times a day for 5 days.  If anything changes with the urine culture I will let you know.  Please let me know if your symptoms do not resolve.  Regarding your knee pain we gave you a knee injection, below is information on post knee injection protocol.  For your sleep issues you were on trazodone in the past and it appears you did well with it, I have sent a prescription for this to your pharmacy.  Please let me know if you have any issues with that.  I hope you have a wonderful afternoon!   Knee Injection A knee injection is a procedure to get medicine into your knee joint to relieve the pain, swelling, and stiffness of arthritis. Your health care provider uses a needle to inject medicine, which may also help to lubricate and cushion your knee joint. You may need more than one injection. Tell a health care provider about:  Any allergies you have.  All medicines you are taking, including vitamins, herbs, eye drops, creams, and over-the-counter medicines.  Any problems you or family members have had with anesthetic medicines.  Any blood disorders you have.  Any surgeries you have had.  Any medical conditions you have.  Whether you are pregnant or may be pregnant. What are the risks? Generally, this is a safe procedure. However, problems may occur, including:  Infection.  Bleeding.  Symptoms that get worse.  Damage to the area around your knee.  Allergic reaction to any of the medicines.  Skin reactions from repeated injections. What happens before the procedure?  Ask your health care provider about: ? Changing or stopping your regular medicines. This is especially important if you are taking diabetes medicines or blood thinners. ? Taking medicines such as aspirin and  ibuprofen. These medicines can thin your blood. Do not take these medicines unless your health care provider tells you to take them. ? Taking over-the-counter medicines, vitamins, herbs, and supplements.  Plan to have a responsible adult take you home from the hospital or clinic. What happens during the procedure?  You will sit or lie down in a position for your knee to be treated.  The skin over your kneecap will be cleaned with a germ-killing soap.  You will be given a medicine that numbs the area (local anesthetic). You may feel some stinging.  The medicine will be injected into your knee. The needle is carefully placed between your kneecap and your knee. The medicine is injected into the joint space.  The needle will be removed at the end of the procedure.  A bandage (dressing) may be placed over the injection site. The procedure may vary among health care providers and hospitals.   What can I expect after the procedure?  Your blood pressure, heart rate, breathing rate, and blood oxygen level will be monitored until you leave the hospital or clinic.  You may have to move your knee through its full range of motion. This helps to get all the medicine into your joint space.  You will be watched to make sure that you do not have a reaction to the injected medicine.  You may feel more pain, swelling, and warmth than you did before the injection. This reaction may last about  1-2 days. Follow these instructions at home: Medicines  Take over-the-counter and prescription medicines only as told by your health care provider.  Ask your health care provider if the medicine prescribed to you requires you to avoid driving or using machinery.  Do not take medicines such as aspirin and ibuprofen unless your health care provider tells you to take them. Injection site care  Follow instructions from your health care provider about: ? How to take care of your puncture site. ? When and how you  should change your dressing. ? When you should remove your dressing.  Check your injection area every day for signs of infection. Check for: ? More redness, swelling, or pain after 2 days. ? Fluid or blood. ? Pus or a bad smell. ? Warmth. Managing pain, stiffness, and swelling  If directed, put ice on the injection area. To do this: ? Put ice in a plastic bag. ? Place a towel between your skin and the bag. ? Leave the ice on for 20 minutes, 2-3 times per day. ? Remove the ice if your skin turns bright red. This is very important. If you cannot feel pain, heat, or cold, you have a greater risk of damage to the area.  Do not apply heat to your knee.  Raise (elevate) the injection area above the level of your heart while you are sitting or lying down.   General instructions  If you were given a dressing, keep it dry until your health care provider says it can be removed. Ask your health care provider when you can start showering or bathing.  Avoid strenuous activities for as long as directed by your health care provider. Ask your health care provider when you can return to your normal activities.  Keep all follow-up visits. This is important. You may need more injections. Contact a health care provider if you have:  A fever.  Warmth in your injection area.  Fluid, blood, or pus coming from your injection site.  Symptoms at your injection site that last longer than 2 days after your procedure. Get help right away if:  Your knee turns very red.  Your knee becomes very swollen.  Your knee is in severe pain. Summary  A knee injection is a procedure to get medicine into your knee joint to relieve the pain, swelling, and stiffness of arthritis.  A needle is carefully placed between your kneecap and your knee to inject medicine into the joint space.  Before the procedure, ask your health care provider about changing or stopping your regular medicines, especially if you are  taking diabetes medicines or blood thinners.  Contact your health care provider if you have any problems or questions after your procedure. This information is not intended to replace advice given to you by your health care provider. Make sure you discuss any questions you have with your health care provider. Document Revised: 08/13/2019 Document Reviewed: 08/13/2019 Elsevier Patient Education  2021 [employer].

## 2020-07-13 DIAGNOSIS — R8290 Unspecified abnormal findings in urine: Secondary | ICD-10-CM | POA: Insufficient documentation

## 2020-07-13 NOTE — Assessment & Plan Note (Signed)
Chronic issue, patient notes that she needs to see an Ortho.  Stable by knee injection today.  Injection was given today and patient tolerated well.  We will follow-up as needed.

## 2020-07-13 NOTE — Assessment & Plan Note (Signed)
Patient continues to have issues with recurrent UTIs.  We will change her treatment to a more frequent dosage and see if this helps eliminate the UTI.  Keflex 500 mg 4 times daily prescribed for 5 days.  We discussed hygiene and patient will keep track of when the symptoms resolve.  I collected urine culture and we will call the patient if we need to change antibiotics.  Strict ED and return precautions given.

## 2020-07-13 NOTE — Assessment & Plan Note (Signed)
Patient reports continued issues with sleeping.  She was on trazodone in the past and would like a refill on the prescription.  Refill sent to patient's pharmacy.

## 2020-07-15 LAB — URINE CULTURE

## 2020-07-27 ENCOUNTER — Other Ambulatory Visit: Payer: Self-pay

## 2020-07-27 ENCOUNTER — Ambulatory Visit (INDEPENDENT_AMBULATORY_CARE_PROVIDER_SITE_OTHER): Payer: 59 | Admitting: Family Medicine

## 2020-07-27 ENCOUNTER — Encounter: Payer: Self-pay | Admitting: Family Medicine

## 2020-07-27 VITALS — BP 150/86 | HR 95 | Ht 63.0 in | Wt 168.0 lb

## 2020-07-27 DIAGNOSIS — N390 Urinary tract infection, site not specified: Secondary | ICD-10-CM | POA: Diagnosis not present

## 2020-07-27 DIAGNOSIS — F319 Bipolar disorder, unspecified: Secondary | ICD-10-CM

## 2020-07-27 DIAGNOSIS — M170 Bilateral primary osteoarthritis of knee: Secondary | ICD-10-CM | POA: Diagnosis not present

## 2020-07-27 DIAGNOSIS — M1711 Unilateral primary osteoarthritis, right knee: Secondary | ICD-10-CM | POA: Diagnosis not present

## 2020-07-27 LAB — POCT URINALYSIS DIP (MANUAL ENTRY)
Glucose, UA: NEGATIVE mg/dL
Nitrite, UA: POSITIVE — AB
Protein Ur, POC: 30 mg/dL — AB
Spec Grav, UA: >=1.03 — AB (ref 1.010–1.025)
Urobilinogen, UA: 1 E.U./dL
pH, UA: 6 (ref 5.0–8.0)

## 2020-07-27 LAB — POCT UA - MICROSCOPIC ONLY

## 2020-07-27 MED ORDER — MELOXICAM 15 MG PO TABS: 15.0000 mg | ORAL_TABLET | Freq: Every day | ORAL | 1 refills | Status: DC

## 2020-07-27 NOTE — Patient Instructions (Addendum)
It was a pleasure to see you today!  1. For your knee pain: I recommend using mobic, you can take one pill daily for knee pain. I also recommend ice, gentle exercise while sitting down, elevating your legs when at home, and wearing an ace wrap for compression.   2. Knee pain: Dr. Renaye Rakers did your knee surgery last year, please call his office to schedule a follow up appointment: [phone]  3. I recommend we get you back into psychiatric care. I will have our social work team connect with you to help make those appointments.  4. I retested your urine today. If you still have an infection, I recommend you will need referral to a urologic specialist. If this is the case, I will call to let you know results and you should then get a call in the next 1-2 weeks to schedule an appointment with urology.  5. Make an appointment to see Dr. Nobie Putnam, your PCP, in 1 month  6. I have placed some resources in case of mental health emergency below  Be Well,  Dr. Leary Roca   If you are feeling suicidal or depression symptoms worsen please immediately go to:   24 Hour Availability [facility]  [address] [place], [place] [employer] [phone] Crisis [phone]    . If you are thinking about harming yourself or having thoughts of suicide, or if you know someone who is, seek help right away. . Call your doctor or mental health care provider. . Call 911 or go to a hospital emergency room to get immediate help, or ask a friend or family member to help you do these things. . Call the [place] National Suicide Prevention Lifeline's toll-free, 24-hour hotline at 1-800-273-TALK 914-753-9682) or TTY: 1-800-799-4 TTY (514)825-6050) to talk to a trained counselor. . If you are in crisis, make sure you are not left alone.  . If someone else is in crisis, make sure he or she is not left alone   [facility] the [employer] (Domestic Violence, Rape & Victim  Assistance [phone]  [facility] [employer] [facility]    (ONLY from 8am-4pm)    [phone]  [facility] Mobile Crisis Unit (24/7)   [phone]  [place] National Suicide Hotline   910-180-1200 Len Childs)

## 2020-07-27 NOTE — Progress Notes (Signed)
SUBJECTIVE:   CHIEF COMPLAINT / HPI: knee pain  Knee pain: patient has history of OA. Received CSI by Dr. Nobie Putnam 2 weeks ago (5/2). She had x-ray of R knee in July 2021 that showed OA. Today she reports that she has had aching pain that has returned to her knee. She reports only a few days of relief of pain after CSI. She has noticed that both of her knees lock and she will lose her balance. Reports 2 falls this week, no injury, she did not hit her head, no LOC. She is using a cane as a result. She had a left meniscectomy with Dr. Renaye Rakers in July 2021. She reports completing PT in January 2022.   UTI: patient had e. Coli >100k CFU on urine culture, sensitive to keflex. She was treated with keflex 500 mg QID x 5 days, reports completing the entire rx as instructed. Today she only notes urinary frequency, unclear if stress incontinence, or UTI sx. Denies dysuria, fever, chills, flank pain. Patient has had 6 UTIs since November 2021, all cultures >100K CFUs of E. Coli resistant to bactrim. Will repeat UA and cx today due to symptoms.  Mental Health: Patient has a complex mental health hx of BPD, 1 year ago was seeing Dr. Lolly Mustache, psychiatry at [facility]. Previously responded well to Latuda and zyprexa, had difficulty with many other drugs. Today she has elevated PHQ-9 with postive answer on question 9 (score of 1). She reports that the chronic pain in her knee has been prompting her to have thoughts like "It would be better if I didn't wake up." She has not had specific thoughts of dying or attempting suicide, has not plan to do so. She has a previous suicide attempt in 1993 when she overdosed on pills (cannot remember which). She reports that she has family support, and has been planning things in the future.   PERTINENT  PMH / PSH: knee OA  OBJECTIVE:   BP (!) 150/86   Pulse 95   Ht 5\' 3"  (1.6 m)   Wt 168 lb (76.2 kg)   SpO2 99%   BMI 29.76 kg/m   Nursing note and vitals reviewed GEN:  age-appropriate, AAW, resting comfortably in chair, NAD, WNWD HEENT: NCAT. Sclera without injection or icterus.   Abdomen: no CVA tenderness, no suprapubic tenderness. Soft, ND, NT. Neuro: Alert and at baseline Bilateral knees: Inspection was negative for erythema, ecchymosis. Mild effusion presesnt bilaterally around joint lines. No obvious bony abnormalities or signs of osteophyte development. Palpation yielded no asymmetric warmth; + joint line tenderness; + patellar tenderness; + patellar crepitus. Patellar and quadriceps tendons atrophic, and no tenderness of the pes anserine bursa. No obvious Baker's cyst development. ROM abnormal in flexion (90 degrees) and extension (200 degrees). Neurovascularly intact bilaterally. Negative lachman's bilaterally, positive McMurray's on right knee. Psych: patient reports occasional low mood with passive SI, full affect, appropriately groomed, speech is fluent, no flight of ideas or irritation. No VAH. ASSESSMENT/PLAN:   Osteoarthritis of right knee Chronic issue not significantly improved with recent CSI. Recommend compression for swelling, gentle exercises. Prescribed mobic for pain as well as recommended ice. Since she has completed physical therapy recently, has known OA and meniscal pathology, recommend patient follow up with orthopedics. Considered repeat imaging, but will leave this to specialist to avoid repeat costs/exposure.   Bipolar 1 disorder Patient has complex mental health history including h/o latuda and zyprexa prescriptions, currently is not on any medication. Patient has passive  SI. Previously established with Dr. Lolly Mustache at [facility] within the last 1.5 years. Due to significantly complex psychiatric history, patient would be best served by being reconnected with psychiatric care. She is agreeable to this and requests SW assistance with care coordination, referral placed. Discussed safety planning, gave patient resources in case of crisis or  worsening SI. Patient to f/u with PCP in 2-4 weeks.   Recurrent UTI Patient has had 6 confirmed episodes of UTI with e.coli in 6 months. Culture sensitivities show that it is only resistant to bactrim and patient has been treated with keflex several times, most recently finished course in first week of May. Will repeat UA and urine culture today due to ongoing symptoms. If patient still has a UTI, recommend treating with appropriate abx per sensitivities and not keflex. Will refer patient to urology as there may be an underlying anatomical cause of these UTIs.     Shirlean Mylar, MD [facility]

## 2020-07-30 NOTE — Assessment & Plan Note (Signed)
Chronic issue not significantly improved with recent CSI. Recommend compression for swelling, gentle exercises. Prescribed mobic for pain as well as recommended ice. Since she has completed physical therapy recently, has known OA and meniscal pathology, recommend patient follow up with orthopedics. Considered repeat imaging, but will leave this to specialist to avoid repeat costs/exposure.

## 2020-07-30 NOTE — Assessment & Plan Note (Signed)
Patient has had 6 confirmed episodes of UTI with e.coli in 6 months. Culture sensitivities show that it is only resistant to bactrim and patient has been treated with keflex several times, most recently finished course in first week of May. Will repeat UA and urine culture today due to ongoing symptoms. If patient still has a UTI, recommend treating with appropriate abx per sensitivities and not keflex. Will refer patient to urology as there may be an underlying anatomical cause of these UTIs.

## 2020-07-30 NOTE — Assessment & Plan Note (Addendum)
Patient has complex mental health history including h/o latuda and zyprexa prescriptions, currently is not on any medication. Patient has passive SI. Previously established with Dr. Lolly Mustache at [facility] within the last 1.5 years. Due to significantly complex psychiatric history, patient would be best served by being reconnected with psychiatric care. She is agreeable to this and requests SW assistance with care coordination, referral placed. Discussed safety planning, gave patient resources in case of crisis or worsening SI. Patient to f/u with PCP in 2-4 weeks.

## 2020-07-31 LAB — URINE CULTURE

## 2020-08-01 ENCOUNTER — Telehealth: Payer: Self-pay | Admitting: Family Medicine

## 2020-08-01 MED ORDER — CEPHALEXIN 500 MG PO CAPS: 500.0000 mg | ORAL_CAPSULE | Freq: Four times a day (QID) | ORAL | 0 refills | Status: AC

## 2020-08-01 NOTE — Telephone Encounter (Signed)
Called patient to discuss results of urine culture.  No answer LVM to return call.  Please let patient know that she has a urinary infection with E.Coli.  I have sent a prescription to her pharmacy for Keflex 500 mg to take 4 times a day for 5 days.  She can take one at breakfast, lunch, dinner and before bedtime.  Complete treatment and can follow up with PCP as needed or sooner if symptoms worsen.   Dana Allan, MD Family Medicine Residency

## 2020-08-02 ENCOUNTER — Telehealth: Payer: Self-pay | Admitting: *Deleted

## 2020-08-02 NOTE — Chronic Care Management (AMB) (Signed)
  Care Management   Note  08/02/2020 Name: Kristin [name] MRN: [id number] DOB: [birth year]  Kristin [name] is a 45 y.o. year old female who is a primary care patient of Derrel Nip, MD. I reached out to Kristin [name] by phone today in response to a referral sent by Ms. Bing Matter PCP, Derrel Nip, MD.  Ms. Horkey was given information about care management services today including:  1. Care management services include personalized support from designated clinical staff supervised by her physician, including individualized plan of care and coordination with other care providers 2. 24/7 contact phone numbers for assistance for urgent and routine care needs. 3. The patient may stop care management services at any time by phone call to the office staff.  Patient agreed to services and verbal consent obtained.   Follow up plan: Telephone appointment with care management team member scheduled for:08/10/2020  [facility] Guide, Embedded Care Coordination [facility] Management

## 2020-08-10 ENCOUNTER — Telehealth: Payer: Self-pay | Admitting: Licensed Clinical Social Worker

## 2020-08-10 ENCOUNTER — Other Ambulatory Visit: Payer: Self-pay | Admitting: Family Medicine

## 2020-08-10 ENCOUNTER — Telehealth: Payer: 59

## 2020-08-10 NOTE — Chronic Care Management (AMB) (Signed)
    Clinical Social Work  Care Management   Phone Outreach    08/10/2020 Name: KEGAN SHEPARDSON MRN: [id number] DOB: 1975-05-08  [name] is a 45 y.o. year old female who is a primary care patient of Derrel Nip, MD .   CCM LCSW reached out to patient today by phone to introduce self, assess needs and offer Care Management services and interventions.    Telephone outreach was unsuccessful Unable to leave a HIPPA compliant phone message due to voice mail not set up. sent SMS message with phone number.  Plan:CCM LCSW will wait for return call. If no return call is received, Will route chart to Care Guide to see if patient would like to reschedule phone appointment   Review of patient status, including review of consultants reports, relevant laboratory and other test results, and collaboration with appropriate care team members and the patient's provider was performed as part of comprehensive patient evaluation and provision of care management services.    Sammuel Hines, LCSW Care Management & Coordination  [facility] / [facility]   [phone] 1:44 PM

## 2020-09-22 ENCOUNTER — Ambulatory Visit: Payer: 59 | Admitting: Licensed Clinical Social Worker

## 2020-09-22 DIAGNOSIS — F410 Panic disorder [episodic paroxysmal anxiety] without agoraphobia: Secondary | ICD-10-CM

## 2020-09-22 DIAGNOSIS — F411 Generalized anxiety disorder: Secondary | ICD-10-CM

## 2020-09-22 DIAGNOSIS — F319 Bipolar disorder, unspecified: Secondary | ICD-10-CM

## 2020-09-22 NOTE — Patient Instructions (Signed)
Licensed Clinical Social Worker Visit Information  Goals we discussed today:   Goals Addressed             This Visit's Progress    Begin and Stick with Counseling-Depression       Timeframe:  Short-Term Goal Priority:  High Start Date: 09/22/2020                            Expected End Date:                       Follow Up Date 09/29/2020   Patient Goals/Self-Care Activities: Return phone calls from [facility] [phone] Walk-in to [facility] if you have a crisis    Why is this important?   Beating depression may take some time.  If you don't feel better right away, don't give up on your treatment plan.       Ms. Mossa was given information about Care Management services today including:  Care Management services include personalized support from designated clinical staff supervised by her physician, including individualized plan of care and coordination with other care providers 24/7 contact phone numbers for assistance for urgent and routine care needs. The patient may stop Care Management services at any time by phone call to the office staff.   Patient agreed to services and verbal consent obtained.   Patient verbalizes understanding of instructions provided today.   Follow up plan: Appointment scheduled for SW follow up with client by phone on: 09/29/2020   Sammuel Hines, LCSW Care Management & Coordination  [phone]

## 2020-09-22 NOTE — Chronic Care Management (AMB) (Signed)
Care Management Clinical Social Work Note  09/22/2020 Name: Kristin [name] MRN: [id number] DOB: [birth year]  Kristin [name] is a 45 y.o. year old female who is a primary care patient of Kristin Nip, MD.  The Care Management team was consulted for assistance with chronic disease management and coordination needs.  Engaged with patient by telephone for initial visit in response to provider referral for social work chronic care management and care coordination services  Consent to Services:  Kristin [name] was given information about Care Management services today including:  Care Management services includes personalized support from designated clinical staff supervised by her physician, including individualized plan of care and coordination with other care providers 24/7 contact phone numbers for assistance for urgent and routine care needs. The patient may stop case management services at any time by phone call to the office staff.  Patient agreed to services and consent obtained.   Assessment: Patient is currently experiencing difficulty with focus and forgetfulness which seems to be exacerbated by untreated mental health needs.. See Care Plan below for interventions and patient self-care actives. Recent life changes /stressors: living with family does not have her own place. Recommendation: Patient may benefit from, and is in agreement to do walk in to [facility] if needed.  Follow up Plan: Patient would like continued follow-up from CCM LCSW .  Follow up scheduled in one week. Patient will call office if needed prior to next encounter.   Review of patient past medical history, allergies, medications, and health status, including review of relevant consultants reports was performed today as part of a comprehensive evaluation and provision of chronic care management and care coordination services.  SDOH (Social Determinants of Health) assessments and interventions performed:  SDOH  Interventions    Flowsheet Row Most Recent Value  SDOH Interventions   Housing Interventions Other (Comment)  Stress Interventions Provide Counseling        Advanced Directives Status: Not addressed in this encounter.  Care Plan  Allergies  Allergen Reactions   Sumatriptan Anaphylaxis    High blood pressure and numb   Other     Bananas-stomach cramps   Eggs Or Egg-Derived Products Rash and Other (See Comments)    Stomach cramps    Outpatient Encounter Medications as of 09/22/2020  Medication Sig   acetaminophen (TYLENOL) 500 MG tablet Take 1 tablet (500 mg total) by mouth every 8 (eight) hours as needed for mild pain.   diphenhydrAMINE (BENADRYL) 25 MG tablet Take 25 mg by mouth every 6 (six) hours as needed for allergies, sleep or itching.   ferrous sulfate 325 (65 FE) MG tablet Take 1 tablet (325 mg total) by mouth daily.   folic acid (FOLVITE) 1 MG tablet Take 1 tablet (1 mg total) by mouth daily.   gabapentin (NEURONTIN) 300 MG capsule TAKE 1 CAPSULE(300 MG) BY MOUTH THREE TIMES DAILY (Patient taking differently: Take 300 mg by mouth 3 (three) times daily.)   lisinopril-hydrochlorothiazide (ZESTORETIC) 10-12.5 MG tablet Take 1 tablet by mouth daily.   meloxicam (MOBIC) 15 MG tablet Take 1 tablet (15 mg total) by mouth daily.   OLANZapine (ZYPREXA) 5 MG tablet TAKE 1 TABLET(5 MG) BY MOUTH AT BEDTIME (Patient taking differently: Take 5 mg by mouth at bedtime.)   ondansetron (ZOFRAN) 4 MG tablet Take 1 tablet (4 mg total) by mouth every 8 (eight) hours as needed for nausea or vomiting.   pantoprazole (PROTONIX) 40 MG tablet Take 1 tablet (40 mg total) by mouth daily.  thiamine 100 MG tablet Take 1 tablet (100 mg total) by mouth daily.   tiZANidine (ZANAFLEX) 4 MG tablet TAKE 1 TO 2 TABLETS(4 TO 8 MG) BY MOUTH EVERY 6 HOURS AS NEEDED FOR MUSCLE SPASMS (Patient taking differently: Take 4-8 mg by mouth every 6 (six) hours as needed for muscle spasms.)   traZODone (DESYREL) 100 MG  tablet TAKE 1/2 TO 1 TABLET(50 TO 100 MG) BY MOUTH AT BEDTIME AS NEEDED FOR SLEEP   [DISCONTINUED] fluticasone (FLONASE) 50 MCG/ACT nasal spray Place 2 sprays into both nostrils daily. (Patient taking differently: Place 2 sprays into both nostrils daily as needed for allergies. )   [DISCONTINUED] promethazine (PHENERGAN) 25 MG tablet Take 1 tablet (25 mg total) by mouth every 6 (six) hours as needed for nausea or vomiting.   No facility-administered encounter medications on file as of 09/22/2020.    Patient Active Problem List   Diagnosis Date Noted   Abnormal urine odor 07/13/2020   Functional diarrhea    Migraine headache    Anemia    Recurrent UTI 08/06/2019   Esophagitis determined by endoscopy    Generalized anxiety disorder 08/23/2015   Osteoarthritis of right knee 12/21/2014   Allergic rhinitis 06/12/2012   Eczema 03/26/2012   Alcohol use disorder, mild, abuse 10/26/2010   Bipolar 1 disorder (HCC) 10/09/2010   Panic attacks 10/09/2010   Insomnia 06/29/2009   TOBACCO USER 01/23/2009   Obesity 10/29/2008   Essential hypertension 12/20/2006    Conditions to be addressed/monitored: Anxiety and Bipolar Disorder;   Care Plan : General Social Work (Adult)  Updates made by Soundra Pilon, LCSW since 09/22/2020 12:00 AM   Problem: Emotional Distress    Goal: Emotional Health Supported   Start Date: 09/22/2020  This Visit's Progress: On track  Priority: High  Current Barriers:  Knowledge Deficits related to plan of care for management of Anxiety and Bipolar Disorder  Care Coordination needs related to Housing barriers in a patient with Anxiety and Bipolar Disorder  Social Work CM Clinical Goal(s):  Patient will work with CM clinical Child psychotherapist to connect with mental health provider  through collaboration with care team.   Interventions: 1:1 collaboration with @PCP @ regarding development and update of comprehensive plan of care as evidenced by provider attestation and  co-signature Inter-disciplinary care team collaboration (see longitudinal plan of care) Assessed patient's previous and current treatment, coping skills, support system and barriers to care  Review various resources, discussed options and provided patient information about  Strategies, [employer] / information provided and Suicidal Ideation/Homicidal Ideation assessed:   Financial risk analyst with Mental Health provider  (Status:  New ) Evaluation of current treatment plan related to Anxiety and Bipolar Disorder,  No insurance,   Discussed plans with patient for ongoing care management follow up and provided patient with direct contact information for care management team Collaborated with [facility] regarding getting patient an appointment; Called while patient on the to schedule appointment and sent referral via EPIC Assessed social determinant of health barriers; Patient needs two more pay stubs; Will work on housing resources during next encounter  Patient Goals/Self-Care Activities: Return phone calls from [facility] [phone] Walk-in to [facility] if you have a crisis  Follow Up Plan: Telephone follow up appointment with care management team member scheduled for: 09/29/2020     10/01/2020, LCSW Care Management & Coordination  [facility] / [facility]  [phone] 2:29 PM

## 2020-09-23 ENCOUNTER — Other Ambulatory Visit: Payer: Self-pay

## 2020-09-23 MED ORDER — LISINOPRIL-HYDROCHLOROTHIAZIDE 10-12.5 MG PO TABS: 1.0000 | ORAL_TABLET | Freq: Every day | ORAL | 3 refills | Status: DC

## 2020-09-29 ENCOUNTER — Telehealth: Payer: Medicaid Other

## 2020-09-29 ENCOUNTER — Telehealth: Payer: Self-pay | Admitting: Licensed Clinical Social Worker

## 2020-09-29 NOTE — Chronic Care Management (AMB) (Signed)
    Clinical Social Work  Care Management   Phone Outreach    09/29/2020 Name: Kristin [name] MRN: [id number] DOB: [birth year]  Kristin [name] is a 45 y.o. year old female who is a primary care patient of Derrel Nip, MD .   Two out reach attempts for F/U phone call today to assess needs, progress and barriers with care plan goals. To connect with mental health provider.  Not from [facility] indicated they have called patient and unable to reach her. They left a voice message requesting a return call.  LCSW left a HIPPA compliant phone message for the patient during this encounter providing contact information and requesting a return call. Patient has previously states she does not check her voice messages,    Plan:Will route chart to Care Guide to see if patient would like to reschedule phone appointment   Review of patient status, including review of consultants reports, relevant laboratory and other test results, and collaboration with appropriate care team members and the patient's provider was performed as part of comprehensive patient evaluation and provision of care management services.     Sammuel Hines, LCSW Care Management & Coordination  [facility] / [facility]   [phone] 3:42 PM

## 2020-10-01 ENCOUNTER — Telehealth: Payer: Self-pay | Admitting: *Deleted

## 2020-10-01 NOTE — Chronic Care Management (AMB) (Signed)
  Care Management   Note  10/01/2020 Name: Kristin [name] MRN: [id number] DOB: October 23, 1975  Kristin [name] is a 45 y.o. year old female who is a primary care patient of Derrel Nip, MD and is actively engaged with the care management team. I reached out to Kristin [name] by phone today to assist with re-scheduling a follow up visit with the Licensed Clinical Social Worker  Follow up plan: Unsuccessful telephone outreach attempt made. A HIPAA compliant phone message was left for the patient providing contact information and requesting a return call.  The care management team will reach out to the patient again over the next 7 days.  If patient returns call to provider office, please advise to call Embedded Care Management Care Guide Gwenevere Ghazi at [phone].  Gwenevere Ghazi  Care Guide, Embedded Care Coordination [facility] Management  Direct Dial: [phone]

## 2020-10-05 NOTE — Chronic Care Management (AMB) (Signed)
  Care Management   Note  10/05/2020 Name: [name] MRN: [id number] DOB: 1976/03/10  Kristin [name] is a 45 y.o. year old female who is a primary care patient of Derrel Nip, MD and is actively engaged with the care management team. I reached out to Kristin [name] by phone today to assist with re-scheduling a follow up visit with the Licensed Clinical Social Worker  Follow up plan: Telephone appointment with care management team member scheduled for:10/12/20  Gwenevere Ghazi  Care Guide, Embedded Care Coordination [facility]  Care Management  Direct Dial: [phone]

## 2020-10-12 ENCOUNTER — Ambulatory Visit: Payer: Medicaid Other | Admitting: Licensed Clinical Social Worker

## 2020-10-12 DIAGNOSIS — Z7189 Other specified counseling: Secondary | ICD-10-CM

## 2020-10-12 NOTE — Patient Instructions (Signed)
Visit Information   Goals Addressed             This Visit's Progress    Begin and Stick with Counseling-Depression   On track    Timeframe:  Short-Term Goal Priority:  High Start Date: 09/22/2020                            Expected End Date:                        Patient Goals/Self-Care Activities: Return phone calls from [facility] [phone] Walk-in to [facility] if you have a crisis   Why is this important?   Beating depression may take some time.  If you don't feel better right away, don't give up on your treatment plan.        Medication Adherence Maintained       Patient Goals/Self-Care Activities: Over the next 14 days  Follow up on insurance options ( [employer], [employer] and Affordable Care Act) I have placed a referral to the pharmacy team to see if they have additional options for you       Patient verbalizes understanding of instructions provided today.  No follow up scheduled during this encounter, LCSW will f/u in 2 to 3 weeks based on referrals placed   Sammuel Hines, [facility] Care Management & Coordination  [phone]

## 2020-10-12 NOTE — Chronic Care Management (AMB) (Signed)
Care Management   Clinical Social Work Note  10/12/2020 Name: Kristin [name] MRN: [id number] DOB: Sep 20, 1975  Kristin [name] is a 45 y.o. year old female who is a primary care patient of Derrel Nip, MD. The CCM team was consulted to assist the patient with chronic disease management and/or care coordination needs related to: Mental Health Counseling and Resources and medication assistance  .   Engaged with patient by telephone for follow up visit in response to provider referral for social work chronic care management and care coordination services.   Consent to Services:  The patient was given information about Chronic Care Management services, agreed to services, and gave verbal consent prior to initiation of services.  Please see initial visit note for detailed documentation.   Patient agreed to services and consent obtained.   Assessment: Patient is engaged in conversation she has not been able to connect with [facility]. They call but not able to reach patient . See Care Plan below for interventions and patient self-care actives.  Recent life changes /stressors: unable to afford medications gabapentin (NEURONTIN) 300 MG capsule  and  lisinopril-hydrochlorothiazide (ZESTORETIC) 10-12.5 MG tablet  ( see care plan below)  Recommendation: Patient may benefit from, and is in agreement to call [facility] to schedule appointment and explore insurance options since she has returned to work.   Follow up Plan: No follow up scheduled with CCM team at this time. Will follow up with patient in 2 to 3 weeks. Sooner if needed based on referrals places .   Review of patient past medical history, allergies, medications, and health status, including review of relevant consultants reports was performed today as part of a comprehensive evaluation and provision of chronic care management and care coordination services.     SDOH (Social Determinants of  Health) assessments and interventions performed:    Advanced Directives Status: Not addressed in this encounter.  CCM Care Plan  Allergies  Allergen Reactions   Sumatriptan Anaphylaxis    High blood pressure and numb   Other     Bananas-stomach cramps   Eggs Or Egg-Derived Products Rash and Other (See Comments)    Stomach cramps    Outpatient Encounter Medications as of 10/12/2020  Medication Sig   acetaminophen (TYLENOL) 500 MG tablet Take 1 tablet (500 mg total) by mouth every 8 (eight) hours as needed for mild pain.   diphenhydrAMINE (BENADRYL) 25 MG tablet Take 25 mg by mouth every 6 (six) hours as needed for allergies, sleep or itching.   ferrous sulfate 325 (65 FE) MG tablet Take 1 tablet (325 mg total) by mouth daily.   folic acid (FOLVITE) 1 MG tablet Take 1 tablet (1 mg total) by mouth daily.   gabapentin (NEURONTIN) 300 MG capsule TAKE 1 CAPSULE(300 MG) BY MOUTH THREE TIMES DAILY (Patient taking differently: Take 300 mg by mouth 3 (three) times daily.)   lisinopril-hydrochlorothiazide (ZESTORETIC) 10-12.5 MG tablet Take 1 tablet by mouth daily.   meloxicam (MOBIC) 15 MG tablet Take 1 tablet (15 mg total) by mouth daily.   OLANZapine (ZYPREXA) 5 MG tablet TAKE 1 TABLET(5 MG) BY MOUTH AT BEDTIME (Patient taking differently: Take 5 mg by mouth at bedtime.)   ondansetron (ZOFRAN) 4 MG tablet Take 1 tablet (4 mg total) by mouth every 8 (eight) hours as needed for nausea or vomiting.   pantoprazole (PROTONIX) 40 MG tablet Take 1 tablet (40 mg total) by mouth daily.   thiamine 100  MG tablet Take 1 tablet (100 mg total) by mouth daily.   tiZANidine (ZANAFLEX) 4 MG tablet TAKE 1 TO 2 TABLETS(4 TO 8 MG) BY MOUTH EVERY 6 HOURS AS NEEDED FOR MUSCLE SPASMS (Patient taking differently: Take 4-8 mg by mouth every 6 (six) hours as needed for muscle spasms.)   traZODone (DESYREL) 100 MG tablet TAKE 1/2 TO 1 TABLET(50 TO 100 MG) BY MOUTH AT BEDTIME AS NEEDED FOR SLEEP   [DISCONTINUED]  fluticasone (FLONASE) 50 MCG/ACT nasal spray Place 2 sprays into both nostrils daily. (Patient taking differently: Place 2 sprays into both nostrils daily as needed for allergies. )   [DISCONTINUED] promethazine (PHENERGAN) 25 MG tablet Take 1 tablet (25 mg total) by mouth every 6 (six) hours as needed for nausea or vomiting.   No facility-administered encounter medications on file as of 10/12/2020.    Patient Active Problem List   Diagnosis Date Noted   Abnormal urine odor 07/13/2020   Functional diarrhea    Migraine headache    Anemia    Recurrent UTI 08/06/2019   Esophagitis determined by endoscopy    Generalized anxiety disorder 08/23/2015   Osteoarthritis of right knee 12/21/2014   Allergic rhinitis 06/12/2012   Eczema 03/26/2012   Alcohol use disorder, mild, abuse 10/26/2010   Bipolar 1 disorder (HCC) 10/09/2010   Panic attacks 10/09/2010   Insomnia 06/29/2009   TOBACCO USER 01/23/2009   Obesity 10/29/2008   Essential hypertension 12/20/2006    Conditions to be addressed/monitored: Bipolar Disorder; Medication procurement and Mental Health Concerns   Care Plan : General Social Work (Adult)  Updates made by Soundra Pilon, LCSW since 10/12/2020 12:00 AM     Problem: Emotional Distress      Goal: Emotional Health Supported by connecting  with mental healt provider   Start Date: 09/22/2020  This Visit's Progress: Not on track  Recent Progress: On track  Priority: High  Note:   Current Barriers:  Knowledge Deficits related to plan of care for management of Anxiety and Bipolar Disorder  Care Coordination needs related to Housing barriers in a patient with Anxiety and Bipolar Disorder No insurance; work schedule Social Work CM Clinical Goal(s):  Patient will work with [employer] clinical Child psychotherapist to connect with mental health provider  through collaboration with care team.   Interventions: Inter-disciplinary care team collaboration (see longitudinal plan of care) Assessed  patient's treatment, progress, coping skills, support system and barriers to care  Solution-Focused Strategies, task Centered.   Connect with Mental Health provider  (Status: Goal on track: NO.   ) Evaluation of current treatment plan related to Anxiety and Bipolar Disorder,   Discussed plans with patient for ongoing care management follow up and provided patient with direct contact information for care management team Collaborated with [facility] regarding getting patient an appointment;  Assessed social determinant of health barriers; Patient needs two more pay stubs; Will work on housing resources during next encounter  Patient Goals/Self-Care Activities: [facility] [phone] to schedule your appointment Walk-in to [facility] if you have a crisis       Problem: Medication Adherence / unable to afford medication      Goal: Medication Adherence Maintained   Start Date: 10/12/2020  This Visit's Progress: On track  Note:   Current barriers:    Financial constraints related to no income and no insurance  Clinical Goals: Patient will work with [facility] pharmacy to address needs related to medication  Clinical  Interventions:  Inter-disciplinary care team collaboration (see longitudinal plan of care) Assessment of needs, barriers , agencies contacted, as well as how impacting Review various resources, discussed options and provided patient information about  Various insurance options ( Orange Card, Haematologist and Affordable Care Act) [facility] pharmacy team Collaborated with [facility] pharmacy / referral placed to see if they are able to assist patient with gabapentin (NEURONTIN) 300 MG capsule    And lisinopril-hydrochlorothiazide (ZESTORETIC) 10-12.5 MG tablet   Solution-Focused Strategies, Active listening / Reflection utilized , and Problem Solving /Task Center  Patient Goals/Self-Care Activities: Over the next 14  days Follow up on insurance options ( Orange Card, [employer] and Affordable Care Act) I have placed a referral to the pharmacy team to see if they have additional options for you      Sammuel Hines, LCSW Care Management & Coordination  [facility] / [employer]   [phone] 2:15 PM

## 2020-10-14 ENCOUNTER — Telehealth: Payer: Self-pay | Admitting: Family Medicine

## 2020-10-14 ENCOUNTER — Other Ambulatory Visit: Payer: Self-pay

## 2020-10-14 DIAGNOSIS — G8929 Other chronic pain: Secondary | ICD-10-CM

## 2020-10-14 DIAGNOSIS — M5441 Lumbago with sciatica, right side: Secondary | ICD-10-CM

## 2020-10-14 MED ORDER — GABAPENTIN 300 MG PO CAPS
300.0000 mg | ORAL_CAPSULE | Freq: Three times a day (TID) | ORAL | 3 refills | Status: DC
  Filled 2020-10-14: qty 90, 30d supply, fill #0
  Filled 2020-11-08: qty 270, 90d supply, fill #0

## 2020-10-14 MED ORDER — LISINOPRIL-HYDROCHLOROTHIAZIDE 10-12.5 MG PO TABS
1.0000 | ORAL_TABLET | Freq: Every day | ORAL | 3 refills | Status: DC
  Filled 2020-10-14 – 2020-11-08 (×2): qty 90, 90d supply, fill #0

## 2020-10-14 NOTE — Telephone Encounter (Signed)
-----   Message from Shona Simpson, CPhT sent at 10/14/2020  9:17 AM EDT ----- Regarding: RE: need help with medication Good morning!  Not sure if this has been handled yet, but since pt has no insurance her best option would [facility] They have $4 and $10 medications for those who are uninsured. Let me know if I need to call her!  Thanks ----- Message ----- From: Soundra Pilon, LCSW Sent: 10/12/2020   1:56 PM EDT To: Derrel Nip, MD, [facility] Subject: need help with medication                      HI This patient needs help with getting her medication.  She has no insurance and had been off of med for a while.  Is there anything you all can do to assist her.  She just started working and has not received her 1st pay check.  Helping her with 30 supply via [employer] maybe a short term option.   gabapentin (NEURONTIN) 300 MG capsule    lisinopril-hydrochlorothiazide (ZESTORETIC) 10-12.5 MG tablet     She is unable to retrieve messages from her phone so she will not get any messages you leave/  Thanks in advance for your assistance  Sammuel Hines, LCSW Care Management & Coordination  [facility] / [facility]   [phone] 1:55 PM

## 2020-10-14 NOTE — Telephone Encounter (Signed)
Called patient regarding medications.  She reports that she cannot afford anything until next week but would like it sent to [facility].  Prescription sent to patient's pharmacy.  She will call if she has any issues.

## 2020-10-16 ENCOUNTER — Emergency Department (HOSPITAL_COMMUNITY)
Admission: EM | Admit: 2020-10-16 | Discharge: 2020-10-16 | Disposition: A | Discharge status: Home / Self Care | Payer: Medicaid Other | Attending: Emergency Medicine | Admitting: Emergency Medicine

## 2020-10-16 ENCOUNTER — Emergency Department (HOSPITAL_COMMUNITY): Payer: Medicaid Other

## 2020-10-16 ENCOUNTER — Encounter (HOSPITAL_COMMUNITY): Payer: Self-pay | Admitting: Emergency Medicine

## 2020-10-16 ENCOUNTER — Other Ambulatory Visit: Payer: Self-pay

## 2020-10-16 DIAGNOSIS — W19XXXA Unspecified fall, initial encounter: Secondary | ICD-10-CM

## 2020-10-16 DIAGNOSIS — S01512A Laceration without foreign body of oral cavity, initial encounter: Secondary | ICD-10-CM | POA: Insufficient documentation

## 2020-10-16 DIAGNOSIS — F17210 Nicotine dependence, cigarettes, uncomplicated: Secondary | ICD-10-CM | POA: Insufficient documentation

## 2020-10-16 DIAGNOSIS — W01198A Fall on same level from slipping, tripping and stumbling with subsequent striking against other object, initial encounter: Secondary | ICD-10-CM | POA: Insufficient documentation

## 2020-10-16 DIAGNOSIS — N3000 Acute cystitis without hematuria: Secondary | ICD-10-CM | POA: Insufficient documentation

## 2020-10-16 DIAGNOSIS — R519 Headache, unspecified: Secondary | ICD-10-CM | POA: Insufficient documentation

## 2020-10-16 DIAGNOSIS — Z79899 Other long term (current) drug therapy: Secondary | ICD-10-CM | POA: Insufficient documentation

## 2020-10-16 DIAGNOSIS — I10 Essential (primary) hypertension: Secondary | ICD-10-CM | POA: Insufficient documentation

## 2020-10-16 DIAGNOSIS — H05232 Hemorrhage of left orbit: Secondary | ICD-10-CM

## 2020-10-16 LAB — URINALYSIS, ROUTINE W REFLEX MICROSCOPIC
Bilirubin Urine: NEGATIVE
Glucose, UA: NEGATIVE mg/dL
Ketones, ur: 5 mg/dL — AB
Nitrite: NEGATIVE
Protein, ur: 30 mg/dL — AB
Specific Gravity, Urine: 1.018 (ref 1.005–1.030)
WBC, UA: >50 WBC/hpf — ABNORMAL HIGH (ref 0–5)
pH: 5 (ref 5.0–8.0)

## 2020-10-16 MED ORDER — CEPHALEXIN 500 MG PO CAPS: 500.0000 mg | ORAL_CAPSULE | Freq: Two times a day (BID) | ORAL | 0 refills | Status: DC

## 2020-10-16 NOTE — ED Provider Notes (Signed)
Emergency Medicine Provider Triage Evaluation Note  Kristin [name] , a 45 y.o. female  was evaluated in triage.  Pt complains of fall.  She states that she fell last week and hit the side of her face and head.  She sustained a laceration to the inside of her mouth.  She states that she continues to have headache.  She also reports some slight dizziness.  She denies any nausea or vomiting.  Additionally, patient states that she thinks she has a UTI, and reports that she has had urinary frequency and urgency.  She is not anticoagulated.  Review of Systems  Positive: Headache, face pain, urinary frequency/urgency Negative: Fever, chills  Physical Exam  BP 119/78 (BP Location: Right Arm)   Pulse 86   Temp 98 F (36.7 C) (Oral)   Resp 17   SpO2 100%  Gen:   Awake, no distress   Resp:  Normal effort  MSK:   Moves extremities without difficulty  Other:  Contusion to left side of face, laceration to left buccal mucosa  Medical Decision Making  Medically screening exam initiated at 1:06 AM.  Appropriate orders placed.  CLELLA MCKEEL was informed that the remainder of the evaluation will be completed by another provider, this initial triage assessment does not replace that evaluation, and the importance of remaining in the ED until their evaluation is complete.  Fall, laceration, contusion   Roxy Horseman, PA-C 10/16/20 0112    Jacalyn Lefevre, MD 10/16/20 [phone]

## 2020-10-16 NOTE — ED Provider Notes (Signed)
[facility]-EMERGENCY DEPT Provider Note   CSN: [id number] Arrival date & time: 10/16/20  0014     History Chief Complaint  Patient presents with   Fall   Jaw Pain   Facial Pain   Oral Swelling    Kristin [name] is a 45 y.o. female.  Pt complains of fall.  She states that she fell last week and hit the side of her face and head.  She sustained a laceration to the inside of her mouth.  She states that she continues to have headache.  She also reports some slight dizziness.  She denies any nausea or vomiting.  Additionally, patient states that she thinks she has a UTI, and reports that she has had urinary frequency and urgency.  She is not anticoagulated.  The history is provided by the patient. No language interpreter was used.      Past Medical History:  Diagnosis Date   Acid reflux    ACL tear 2011   not sure which side   Acute lateral meniscus tear of right knee    Anxiety    Arthritis    Back pain    slipped disc lower back lumbar   Bipolar 1 disorder (HCC)    Depression    Dysfunctional uterine bleeding    Eczema    Hypertension    Insomnia    Migraine headache    Neuromuscular disorder (HCC)    leg cramps    Patient Active Problem List   Diagnosis Date Noted   Abnormal urine odor 07/13/2020   Functional diarrhea    Migraine headache    Anemia    Recurrent UTI 08/06/2019   Esophagitis determined by endoscopy    Generalized anxiety disorder 08/23/2015   Osteoarthritis of right knee 12/21/2014   Allergic rhinitis 06/12/2012   Eczema 03/26/2012   Alcohol use disorder, mild, abuse 10/26/2010   Bipolar 1 disorder (HCC) 10/09/2010   Panic attacks 10/09/2010   Insomnia 06/29/2009   TOBACCO USER 01/23/2009   Obesity 10/29/2008   Essential hypertension 12/20/2006    Past Surgical History:  Procedure Laterality Date   BIOPSY  09/19/2017   Procedure: BIOPSY;  Surgeon: Kathi Der, MD;  Location: [facility];  Service:  Gastroenterology;;   BIOPSY  10/15/2019   Procedure: BIOPSY;  Surgeon: Charlott Rakes, MD;  Location: [facility];  Service: Endoscopy;;   COLONOSCOPY N/A 10/15/2019   Procedure: COLONOSCOPY;  Surgeon: Charlott Rakes, MD;  Location: [facility];  Service: Endoscopy;  Laterality: N/A;   DILITATION & CURRETTAGE/HYSTROSCOPY WITH HYDROTHERMAL ABLATION N/A 04/04/2017   Procedure: DILATATION & CURETTAGE/HYSTEROSCOPY WITH HYDROTHERMAL ABLATION;  Surgeon: Reva Bores, MD;  Location: [facility];  Service: Gynecology;  Laterality: N/A;   ESOPHAGOGASTRODUODENOSCOPY N/A 10/15/2019   Procedure: ESOPHAGOGASTRODUODENOSCOPY (EGD);  Surgeon: Charlott Rakes, MD;  Location: [facility];  Service: Endoscopy;  Laterality: N/A;   ESOPHAGOGASTRODUODENOSCOPY (EGD) WITH PROPOFOL N/A 09/19/2017   Procedure: ESOPHAGOGASTRODUODENOSCOPY (EGD) WITH PROPOFOL;  Surgeon: Kathi Der, MD;  Location: [facility];  Service: Gastroenterology;  Laterality: N/A;   KNEE ARTHROSCOPY WITH LATERAL MENISECTOMY Right 09/30/2019   Procedure: KNEE ARTHROSCOPY WITH LATERAL MENISECTOMY;  Surgeon: Sheral Apley, MD;  Location: [facility];  Service: Orthopedics;  Laterality: Right;   TUBAL LIGATION  yrs ago     OB History     Gravida  4   Para  3   Term  3   Preterm      AB  1  Living  3      SAB      IAB  1   Ectopic      Multiple      Live Births              Family History  Problem Relation Age of Onset   Breast cancer Maternal Grandmother    Hypertension Mother    Hypertension Brother    Colon cancer Maternal Uncle    Bipolar disorder Cousin    Schizophrenia Cousin     Social History   Tobacco Use   Smoking status: Some Days    Packs/day: 0.25    Years: 5.00    Pack years: 1.25    Types: Cigarettes   Smokeless tobacco: Never  Vaping Use   Vaping Use: Never used  Substance Use Topics   Alcohol use: Yes    Comment: occassionally, longer than one  week ago   Drug use: No    Home Medications Prior to Admission medications   Medication Sig Start Date End Date Taking? Authorizing Provider  cephALEXin (KEFLEX) 500 MG capsule Take 1 capsule (500 mg total) by mouth 2 (two) times daily. 10/16/20  Yes Roxy Horseman, PA-C  acetaminophen (TYLENOL) 500 MG tablet Take 1 tablet (500 mg total) by mouth every 8 (eight) hours as needed for mild pain. 07/04/19   Marthenia Rolling, DO  diphenhydrAMINE (BENADRYL) 25 MG tablet Take 25 mg by mouth every 6 (six) hours as needed for allergies, sleep or itching.    [provider]  ferrous sulfate 325 (65 FE) MG tablet Take 1 tablet (325 mg total) by mouth daily. 05/31/20 06/30/20  Cora Collum, DO  folic acid (FOLVITE) 1 MG tablet Take 1 tablet (1 mg total) by mouth daily. 11/20/19   Malachy Mood, MD  gabapentin (NEURONTIN) 300 MG capsule Take 1 capsule (300 mg total) by mouth 3 (three) times daily. 10/14/20   Derrel Nip, MD  lisinopril-hydrochlorothiazide (ZESTORETIC) 10-12.5 MG tablet Take 1 tablet by mouth daily. 10/14/20   Derrel Nip, MD  meloxicam (MOBIC) 15 MG tablet Take 1 tablet (15 mg total) by mouth daily. 07/27/20   Shirlean Mylar, MD  OLANZapine (ZYPREXA) 5 MG tablet TAKE 1 TABLET(5 MG) BY MOUTH AT BEDTIME Patient taking differently: Take 5 mg by mouth at bedtime. 03/19/20   Derrel Nip, MD  ondansetron (ZOFRAN) 4 MG tablet Take 1 tablet (4 mg total) by mouth every 8 (eight) hours as needed for nausea or vomiting. 09/30/19   McBane, Jerald Kief, PA-C  pantoprazole (PROTONIX) 40 MG tablet Take 1 tablet (40 mg total) by mouth daily. 10/13/19   Kathlen Mody, MD  thiamine 100 MG tablet Take 1 tablet (100 mg total) by mouth daily. 05/31/20   Cora Collum, DO  tiZANidine (ZANAFLEX) 4 MG tablet TAKE 1 TO 2 TABLETS(4 TO 8 MG) BY MOUTH EVERY 6 HOURS AS NEEDED FOR MUSCLE SPASMS Patient taking differently: Take 4-8 mg by mouth every 6 (six) hours as needed for muscle spasms. 05/25/20   Derrel Nip, MD  traZODone (DESYREL) 100 MG tablet TAKE 1/2 TO 1 TABLET(50 TO 100 MG) BY MOUTH AT BEDTIME AS NEEDED FOR SLEEP 08/10/20   Derrel Nip, MD  fluticasone [facility]) 50 MCG/ACT nasal spray Place 2 sprays into both nostrils daily. Patient taking differently: Place 2 sprays into both nostrils daily as needed for allergies.  02/25/17 04/14/19  Belinda Fisher, PA-C  promethazine (PHENERGAN) 25 MG tablet Take 1 tablet (25 mg  total) by mouth every 6 (six) hours as needed for nausea or vomiting. 04/17/18 08/29/18  Jacalyn LefevreHaviland, Julie, MD    Allergies    Sumatriptan, Other, and Eggs or egg-derived products  Review of Systems   Review of Systems  All other systems reviewed and are negative.  Physical Exam Updated Vital Signs BP 119/78 (BP Location: Right Arm)   Pulse 86   Temp 98 F (36.7 C) (Oral)   Resp 17   SpO2 100%   Physical Exam Vitals and nursing note reviewed.  Constitutional:      General: She is not in acute distress.    Appearance: She is well-developed.  HENT:     Head: Normocephalic and atraumatic.     Mouth/Throat:     Comments: Laceration to left buccal mucosa Eyes:     Conjunctiva/sclera: Conjunctivae normal.     Comments: Left periorbital hematoma  Cardiovascular:     Rate and Rhythm: Normal rate and regular rhythm.     Heart sounds: No murmur heard. Pulmonary:     Effort: Pulmonary effort is normal. No respiratory distress.     Breath sounds: Normal breath sounds.  Abdominal:     Palpations: Abdomen is soft.     Tenderness: There is no abdominal tenderness.  Musculoskeletal:        General: Normal range of motion.     Cervical back: Neck supple.  Skin:    General: Skin is warm and dry.  Neurological:     Mental Status: She is alert and oriented to person, place, and time.  Psychiatric:        Mood and Affect: Mood normal.        Behavior: Behavior normal.    ED Results / Procedures / Treatments   Labs (all labs ordered are listed, but only abnormal  results are displayed) Labs Reviewed  URINALYSIS, ROUTINE W REFLEX MICROSCOPIC - Abnormal; Notable for the following components:      Result Value   APPearance CLOUDY (*)    Hgb urine dipstick SMALL (*)    Ketones, ur 5 (*)    Protein, ur 30 (*)    Leukocytes,Ua LARGE (*)    WBC, UA >50 (*)    Bacteria, UA MANY (*)    All other components within normal limits  URINE CULTURE    EKG None  Radiology CT HEAD WO CONTRAST (5MM)  Result Date: 10/16/2020 CLINICAL DATA:  Fall several days ago with persistent left-sided jaw pain, initial encounter EXAM: CT HEAD WITHOUT CONTRAST CT MAXILLOFACIAL WITHOUT CONTRAST TECHNIQUE: Multidetector CT imaging of the head and maxillofacial structures were performed using the standard protocol without intravenous contrast. Multiplanar CT image reconstructions of the maxillofacial structures were also generated. COMPARISON:  10/10/2019 FINDINGS: CT HEAD FINDINGS Brain: No evidence of acute infarction, hemorrhage, hydrocephalus, extra-axial collection or mass lesion/mass effect. Patchy chronic white matter ischemic changes are noted. Vascular: No hyperdense vessel or unexpected calcification. Skull: Normal. Negative for fracture or focal lesion. Other: None. CT MAXILLOFACIAL FINDINGS Osseous: No acute fracture is identified. Dental caries are noted particularly in the mandibular molars on the left. Some periapical lucency suggestive of early periapical abscess is noted involving the left second mandibular molar. Orbits: Orbits and their contents are within normal limits. Sinuses: Paranasal sinuses are within normal limits. Soft tissues: Soft tissues demonstrate mild soft tissue swelling along the mandible laterally on the left. A rounded density is noted along the left side of the face just below the left zygomatic arch  likely representing a small hematoma. This measures 2.3 cm in greatest dimension. IMPRESSION: CT of the head: No acute intracranial abnormality noted. CT  of the maxillofacial bones: Dental caries involving the left second mandibular molar with periapical lucency suspicious for early abscess. The surrounding mandibular bone is somewhat sclerotic. Soft tissue hematoma and swelling on the left consistent with the recent injury. Electronically Signed   By: Alcide Clever M.D.   On: 10/16/2020 01:51   CT Maxillofacial Wo Contrast  Result Date: 10/16/2020 CLINICAL DATA:  Fall several days ago with persistent left-sided jaw pain, initial encounter EXAM: CT HEAD WITHOUT CONTRAST CT MAXILLOFACIAL WITHOUT CONTRAST TECHNIQUE: Multidetector CT imaging of the head and maxillofacial structures were performed using the standard protocol without intravenous contrast. Multiplanar CT image reconstructions of the maxillofacial structures were also generated. COMPARISON:  10/10/2019 FINDINGS: CT HEAD FINDINGS Brain: No evidence of acute infarction, hemorrhage, hydrocephalus, extra-axial collection or mass lesion/mass effect. Patchy chronic white matter ischemic changes are noted. Vascular: No hyperdense vessel or unexpected calcification. Skull: Normal. Negative for fracture or focal lesion. Other: None. CT MAXILLOFACIAL FINDINGS Osseous: No acute fracture is identified. Dental caries are noted particularly in the mandibular molars on the left. Some periapical lucency suggestive of early periapical abscess is noted involving the left second mandibular molar. Orbits: Orbits and their contents are within normal limits. Sinuses: Paranasal sinuses are within normal limits. Soft tissues: Soft tissues demonstrate mild soft tissue swelling along the mandible laterally on the left. A rounded density is noted along the left side of the face just below the left zygomatic arch likely representing a small hematoma. This measures 2.3 cm in greatest dimension. IMPRESSION: CT of the head: No acute intracranial abnormality noted. CT of the maxillofacial bones: Dental caries involving the left second  mandibular molar with periapical lucency suspicious for early abscess. The surrounding mandibular bone is somewhat sclerotic. Soft tissue hematoma and swelling on the left consistent with the recent injury. Electronically Signed   By: Alcide Clever M.D.   On: 10/16/2020 01:51    Procedures Procedures   Medications Ordered in ED Medications - No data to display  ED Course  I have reviewed the triage vital signs and the nursing notes.  Pertinent labs & imaging results that were available during my care of the patient were reviewed by me and considered in my medical decision making (see chart for details).    MDM Rules/Calculators/A&P                           Patient here for evaluation of a fall which occurred about 1 week ago.  She sustained a contusion to the left side of her face.  This was evaluated with CT, which shows no facial fractures.  She does have some dental caries, which will need to be assessed by a dentist.  Urinalysis is consistent with infection.  Have started her on Keflex.  The laceration to the buccal mucosa will need to heal by secondary intention given that the injury occurred 7 days ago.  It does not appear infected.  Patient understands agrees with plan.  She is stable ready for discharge. Final Clinical Impression(s) / ED Diagnoses Final diagnoses:  Fall, initial encounter  Periorbital hematoma of left eye  Laceration of oral cavity, initial encounter  Acute cystitis without hematuria    Rx / DC Orders ED Discharge Orders          Ordered  cephALEXin (KEFLEX) 500 MG capsule  2 times daily        10/16/20 0221             Roxy Horseman, PA-C 10/16/20 0224    Jacalyn Lefevre, MD 10/16/20 [phone]

## 2020-10-16 NOTE — ED Triage Notes (Addendum)
Pt reports mechanical fall on Saturday. Now c/o left sided face/jaw pain and laceration on inside of left mouth. Pt did hit head. Denies HA, N/V, and dizziness. Pt not on blood thinners. Pt also concerned for UTI due to urinary frequency and flank pain.

## 2020-10-16 NOTE — ED Notes (Signed)
Called pt, no response. 

## 2020-10-18 LAB — URINE CULTURE: Culture: >=100000 — AB

## 2020-10-19 ENCOUNTER — Telehealth: Payer: Self-pay | Admitting: Emergency Medicine

## 2020-10-19 NOTE — Telephone Encounter (Signed)
Post ED Visit - Positive Culture Follow-up  Culture report reviewed by antimicrobial stewardship pharmacist: [name] []  , Pharm.D. []  Enzo Bi, Pharm.D., BCPS AQ-ID []  , Pharm.D., BCPS []  Celedonio Miyamoto, Pharm.D., BCPS []  [place], Garvin Fila.D., BCPS, AAHIVP []  , Pharm.D., BCPS, AAHIVP []  Georgina Pillion, PharmD, BCPS []  , PharmD, BCPS []  [place], PharmD, BCPS []  [address], PharmD []  , PharmD, BCPS []  Estella Husk, PharmD  Pharmacy Team []  Lysle Pearl, PharmD []  , PharmD []  Phillips Climes, PharmD []  , Rph []  Agapito Games) , PharmD []  Verlan Friends, PharmD []  , PharmD []  Mervyn Gay, PharmD []  , PharmD []  Vinnie Level, PharmD []  [facility], PharmD []  , PharmD []  Len Childs, PharmD   Positive urine culture Treated with cephalexin, organism sensitive to the same and no further patient follow-up is required at this time.  10/19/2020, 9:30 AM

## 2020-10-21 ENCOUNTER — Ambulatory Visit: Payer: Self-pay | Admitting: Licensed Clinical Social Worker

## 2020-10-21 DIAGNOSIS — Z7689 Persons encountering health services in other specified circumstances: Secondary | ICD-10-CM

## 2020-10-21 NOTE — Patient Instructions (Signed)
   I have placed a referral for the pharmacy team to contact you.  Will follow up with you in 2 weeks.  Please call the office prior to our next encounter if needs arise   Sammuel Hines, [facility] Care Management & Coordination  [phone]

## 2020-10-21 NOTE — Chronic Care Management (AMB) (Signed)
  Care Management  Collaboration  Note  10/21/2020 Name: Kristin [name] MRN: [id number] DOB: 1975-04-07  Kristin [name] is a 45 y.o. year old female who is a primary care patient of Derrel Nip, MD. The CCM team was consulted reference care coordination needs for  unable to afford medications .  Assessment: Patient was not interviewed or contacted during this encounter. See Care Plan or interventions for patient self-care actives.   Intervention:Conducted brief assessment, recommendations and relevant information discussed. CCM LCSW collaborated with [facility] team .    Follow up Plan: No follow up scheduled with CCM team at this time. Will follow up with patient in 2 weeks .Patient will call office if needed prior to next encounter.   Review of patient past medical history, allergies, medications, and health status, including review of pertinent consultant reports was performed as part of comprehensive evaluation and provision of care management/care coordination services.   Care Plan Conditions to be addressed/monitored per PCP order: , Medication procurement   Care Plan : General Social Work (Adult)  Updates made by Soundra Pilon, LCSW since 10/21/2020 12:00 AM     Problem: Medication Adherence / unable to afford medication      Goal: Medication Adherence Maintained   Start Date: 10/12/2020  This Visit's Progress: On track  Recent Progress: On track  Note:   Current barriers:    Financial constraints related to no income and no insurance  Clinical Goals: Patient will work with [facility] pharmacy to address needs related to medication  Clinical Interventions:  Inter-disciplinary care team collaboration (see longitudinal plan of care) Assessment of needs, barriers , agencies contacted, as well as how impacting Review various resources, discussed options and provided patient information about  Various insurance options ( Orange Card, Haematologist and Affordable Care  Act) [facility] pharmacy team Collaborated with [facility] pharmacy / will assist patient with connecting to [facility] [facility] pharmacy  gabapentin (NEURONTIN) 300 MG capsule and lisinopril-hydrochlorothiazide (ZESTORETIC) 10-12.5 MG tablet   Solution-Focused Strategies, Active listening / Reflection utilized , and Problem Solving /Task Center  Patient Goals/Self-Care Activities: Over the next 14 days Follow up on insurance options ( Orange Card, [employer] Assistance and Affordable Care Act) I have placed a referral to the pharmacy team to see if they have additional options for you    Sammuel Hines, LCSW Care Management & Coordination  [facility] / [facility]   [phone] 11:23 AM

## 2020-10-22 ENCOUNTER — Other Ambulatory Visit: Payer: Self-pay

## 2020-11-03 ENCOUNTER — Telehealth: Payer: Self-pay | Admitting: Licensed Clinical Social Worker

## 2020-11-03 NOTE — Chronic Care Management (AMB) (Signed)
    Clinical Social Work  Care Management   Phone Outreach    11/03/2020 Name: Kristin [name] MRN: [id number] DOB: 1975/07/11  Kristin [name] is a 45 y.o. year old female who is a primary care patient of Derrel Nip, MD .   Reason for referral: Connect for Mental Health Counseling and medication.  (PCP reached out to patient to discuss picking up medication from community health and wellness). . F/U phone call today to assess needs, progress and barriers with care plan goals.   Telephone outreach was unsuccessful Unable to leave a HIPPA compliant phone message due to voice mail not set up.  Plan:Will route chart to Care Guide to see if patient would like to reschedule phone appointment   Review of patient status, including review of consultants reports, relevant laboratory and other test results, and collaboration with appropriate care team members and the patient's provider was performed as part of comprehensive patient evaluation and provision of care management services.    Sammuel Hines, LCSW Care Management & Coordination  [facility] / [facility]   [phone] 10:19 AM

## 2020-11-04 ENCOUNTER — Telehealth: Payer: Self-pay | Admitting: *Deleted

## 2020-11-04 NOTE — Chronic Care Management (AMB) (Signed)
  Care Management   Note  11/04/2020 Name: [name] MRN: [id number] DOB: 02-Feb-1976  Kristin [name] is a 45 y.o. year old female who is a primary care patient of Derrel Nip, MD and is actively engaged with the care management team. I reached out to Kristin [name] by phone today to assist with re-scheduling a follow up visit with the Licensed Clinical Social Worker  Follow up plan: Unsuccessful telephone outreach attempt made. The care management team will reach out to the patient again over the next 7 days. If patient returns call to provider office, please advise to call Embedded Care Management Care Guide Misty Stanley at [phone].  Gwenevere Ghazi  Care Guide, Embedded Care Coordination [facility] Management  Direct Dial: [phone]

## 2020-11-08 ENCOUNTER — Other Ambulatory Visit: Payer: Self-pay

## 2020-11-11 NOTE — Chronic Care Management (AMB) (Signed)
  Care Management   Note  11/11/2020 Name: Kristin [name] MRN: [id number] DOB: April 11, 1975  Kristin [name] is a 45 y.o. year old female who is a primary care patient of Derrel Nip, MD and is actively engaged with the care management team. I reached out to Kristin [name] by phone today to assist with re-scheduling a follow up visit with the Licensed Clinical Social Worker  Follow up plan: Telephone appointment with care management team member scheduled for:11/17/20  Gwenevere Ghazi  Care Guide, Embedded Care Coordination [facility]  Care Management  Direct Dial: [phone]

## 2020-11-17 ENCOUNTER — Ambulatory Visit: Payer: Medicaid Other | Admitting: Licensed Clinical Social Worker

## 2020-11-17 DIAGNOSIS — Z7189 Other specified counseling: Secondary | ICD-10-CM

## 2020-11-17 NOTE — Chronic Care Management (AMB) (Signed)
Care Management Clinical Social Work Note  11/17/2020 Name: Kristin [name] MRN: [id number] DOB: 1975/09/03  Kristin [name] is a 45 y.o. year old female who is a primary care patient of Derrel Nip, MD.  The Care Management team was consulted for assistance with coordination needs.Arboricult[facility]ist Counseling and Resources  :Consent to Services:  The patient was given information about Care Management services, agreed to services, and gave verbal consent prior to initiation of services.  Please see initial visit note for detailed documentation.   Patient agreed to services today and consent obtained.   Assessment: Engaged with patient by phone in response to provider referral for social work care coordination services: .   She has been able to get medication from [facility] which is a big help with managing her health. She continues to experience difficulty with connecting for mental health treatment and physical heath due to not having insurance..Discussed barriers and possible solutions with not being able to access phone messages on cell phone See Care Plan below for interventions and patient self-care actives.  Recent life changes or stressors: affordable housing;   Recommendation: Patient may benefit from, and is in agreement follow up with agencies discussed today to assist with barriers to care.   Follow up Plan: Patient would like continued follow-up from CCM LCSW .  per patient's request will follow up in 45 days if she has not reached out to LCSW.  Will call office if needed prior to next encounter.    Review of patient past medical history, allergies, medications, and health status, including review of relevant consultants reports was performed today as part of a comprehensive evaluation and provision of chronic care management and care coordination services.  SDOH (Social Determinants of Health) assessments and interventions performed:     Advanced Directives Status: Not addressed in this encounter.  Care Plan  Allergies  Allergen Reactions   Sumatriptan Anaphylaxis    High blood pressure and numb   Other     Bananas-stomach cramps   Eggs Or Egg-Derived Products Rash and Other (See Comments)    Stomach cramps    Outpatient Encounter Medications as of 11/17/2020  Medication Sig   acetaminophen (TYLENOL) 500 MG tablet Take 1 tablet (500 mg total) by mouth every 8 (eight) hours as needed for mild pain.   cephALEXin (KEFLEX) 500 MG capsule Take 1 capsule (500 mg total) by mouth 2 (two) times daily.   diphenhydrAMINE (BENADRYL) 25 MG tablet Take 25 mg by mouth every 6 (six) hours as needed for allergies, sleep or itching.   ferrous sulfate 325 (65 FE) MG tablet Take 1 tablet (325 mg total) by mouth daily.   folic acid (FOLVITE) 1 MG tablet Take 1 tablet (1 mg total) by mouth daily.   gabapentin (NEURONTIN) 300 MG capsule Take 1 capsule (300 mg total) by mouth 3 (three) times daily.   lisinopril-hydrochlorothiazide (ZESTORETIC) 10-12.5 MG tablet Take 1 tablet by mouth daily.   meloxicam (MOBIC) 15 MG tablet Take 1 tablet (15 mg total) by mouth daily.   OLANZapine (ZYPREXA) 5 MG tablet TAKE 1 TABLET(5 MG) BY MOUTH AT BEDTIME (Patient taking differently: Take 5 mg by mouth at bedtime.)   ondansetron (ZOFRAN) 4 MG tablet Take 1 tablet (4 mg total) by mouth every 8 (eight) hours as needed for nausea or vomiting.   pantoprazole (PROTONIX) 40 MG tablet Take 1 tablet (40 mg total) by mouth daily.   thiamine 100 MG  tablet Take 1 tablet (100 mg total) by mouth daily.   tiZANidine (ZANAFLEX) 4 MG tablet TAKE 1 TO 2 TABLETS(4 TO 8 MG) BY MOUTH EVERY 6 HOURS AS NEEDED FOR MUSCLE SPASMS (Patient taking differently: Take 4-8 mg by mouth every 6 (six) hours as needed for muscle spasms.)   traZODone (DESYREL) 100 MG tablet TAKE 1/2 TO 1 TABLET(50 TO 100 MG) BY MOUTH AT BEDTIME AS NEEDED FOR SLEEP   [DISCONTINUED] fluticasone (FLONASE) 50  MCG/ACT nasal spray Place 2 sprays into both nostrils daily. (Patient taking differently: Place 2 sprays into both nostrils daily as needed for allergies. )   [DISCONTINUED] promethazine (PHENERGAN) 25 MG tablet Take 1 tablet (25 mg total) by mouth every 6 (six) hours as needed for nausea or vomiting.   No facility-administered encounter medications on file as of 11/17/2020.    Patient Active Problem List   Diagnosis Date Noted   Abnormal urine odor 07/13/2020   Functional diarrhea    Migraine headache    Anemia    Recurrent UTI 08/06/2019   Esophagitis determined by endoscopy    Generalized anxiety disorder 08/23/2015   Osteoarthritis of right knee 12/21/2014   Allergic rhinitis 06/12/2012   Eczema 03/26/2012   Alcohol use disorder, mild, abuse 10/26/2010   Bipolar 1 disorder (HCC) 10/09/2010   Panic attacks 10/09/2010   Insomnia 06/29/2009   TOBACCO USER 01/23/2009   Obesity 10/29/2008   Essential hypertension 12/20/2006    Conditions to be addressed/monitored: Anxiety, Depression, and Bipolar Disorder; Housing barriers and Level of care concerns  Care Plan : General Social Work (Adult)  Updates made by Soundra Pilon, LCSW since 11/17/2020 12:00 AM     Problem: Emotional Distress      Goal: Emotional Health Supported by connecting  with mental healt provider   Start Date: 09/22/2020  Recent Progress: Not on track  Priority: High  Note:   Current Barriers:  Knowledge Deficits related to plan of care for management of Anxiety and Bipolar Disorder  Care Coordination needs related to Housing barriers in a patient with Anxiety and Bipolar Disorder No insurance; work schedule Social Work [employer] Clinical Goal(s):  Patient will work with [employer] clinical Child psychotherapist to connect with mental health provider  through collaboration with care team.   Interventions: Inter-disciplinary care team collaboration (see longitudinal plan of care) Assessed patient's progress, coping skills,  support system and barriers to care  Solution-Focused Strategies, task Centered; motivational interviewing;   Connect with Mental Health provider  (Status: Goal on track: NO.   ) Evaluation of current treatment plan related to Anxiety and Bipolar Disorder,   Discussed plans with patient for ongoing care management follow up and provided patient with direct contact information for care management team Provided patient with phone number for [facility] and discussed ;  Patient Goals/Self-Care Activities: [facility] [phone] to schedule your appointment Walk-in to [facility] if you have a crisis     Problem: Medication Adherence / unable to afford medication      Goal: physical Health concerns managed   Start Date: 10/12/2020  Recent Progress: On track  Note:   Current barriers:    Financial constraints related to no insurance  Clinical Goals: Patient will work with agencies discussed to address needs related to unmet health needs  Clinical Interventions:  Inter-disciplinary care team collaboration (see longitudinal plan of care) Assessment of needs, barriers , agencies contacted, as well as how impacting Review various  resources, discussed options and provided patient information about Housing options with  and disability  Reviewed insurance options ( [employer], Haematologist and Affordable Care Act)[facility] pharmacy team Will pick up [place] care packet at [employer], Active listening / Reflection utilized , and Problem Solving /Task Center  Patient Goals/Self-Care Activities: Over the next 14 days Pick up [employer] application at the front [employer] for housing options  [employer]CHousingSearch.[place]   [phone] Affordable Housing Management:  [phone]  [email or web address]  [address]  [place], [place] [address] DISABILITY SOCIAL SECURITY ADMINSTRATION [address] [place], [place] [address] [phone] / [phone] [email or web address]        Sammuel Hines, LCSW Care Management & Coordination  [facility]   [phone] 11:22 AM

## 2020-11-17 NOTE — Patient Instructions (Signed)
Visit Information   Goals Addressed             This Visit's Progress    Begin and Stick with Counseling-Depression   Not on track    Timeframe:  Short-Term Goal Priority:  High Start Date: 09/22/2020                            Expected End Date:                        Patient Goals/Self-Care Activities: Return phone calls from [facility] [phone] Walk-in to [facility] if you have a crisis    Why is this important?   Beating depression may take some time.  If you don't feel better right away, don't give up on your treatment plan.         Physical health concerned managed   On track    Patient Goals/Self-Care Activities:   Pick up [employer] application at the front [employer] for housing options  [employer]ousingSearch.[place]   [phone] Affordable Housing Management:  [phone]  [email or web address]  [address] [place], [place] [address] DISABILITY SOCIAL SECURITY ADMINSTRATION [address] [place], [place] [address] [phone] / [phone] [email or web address]         The patient verbalized understanding of instructions, educational materials, and care plan provided today and declined offer to receive copy of patient instructions, educational materials, and care plan.   It was a pleasure speaking with you today. Please call the office if needed No follow up scheduled, per our conversation I will contact you in 45 days if you have not called me.   Sammuel Hines, LCSW Care Management & Coordination  [phone]

## 2020-11-18 ENCOUNTER — Other Ambulatory Visit: Payer: Self-pay | Admitting: Family Medicine

## 2020-11-18 DIAGNOSIS — M170 Bilateral primary osteoarthritis of knee: Secondary | ICD-10-CM

## 2020-11-18 MED ORDER — MELOXICAM 15 MG PO TABS: 15.0000 mg | ORAL_TABLET | Freq: Every day | ORAL | 1 refills | Status: DC

## 2020-11-18 NOTE — Telephone Encounter (Signed)
Patient came into the office and would like to have her Meloxicam refilled.

## 2020-11-18 NOTE — Telephone Encounter (Signed)
Refill request sent to Dr. Nobie Putnam. Sunday Spillers, CMA

## 2020-12-01 ENCOUNTER — Ambulatory Visit: Payer: Self-pay | Admitting: Physician Assistant

## 2020-12-01 ENCOUNTER — Other Ambulatory Visit: Payer: Self-pay

## 2020-12-01 VITALS — BP 146/100 | HR 75 | Temp 98.7°F | Resp 18 | Ht 64.5 in | Wt 172.0 lb

## 2020-12-01 DIAGNOSIS — G8929 Other chronic pain: Secondary | ICD-10-CM

## 2020-12-01 DIAGNOSIS — F319 Bipolar disorder, unspecified: Secondary | ICD-10-CM

## 2020-12-01 DIAGNOSIS — M5441 Lumbago with sciatica, right side: Secondary | ICD-10-CM

## 2020-12-01 DIAGNOSIS — K219 Gastro-esophageal reflux disease without esophagitis: Secondary | ICD-10-CM

## 2020-12-01 DIAGNOSIS — F411 Generalized anxiety disorder: Secondary | ICD-10-CM

## 2020-12-01 DIAGNOSIS — Z13220 Encounter for screening for lipoid disorders: Secondary | ICD-10-CM

## 2020-12-01 DIAGNOSIS — I10 Essential (primary) hypertension: Secondary | ICD-10-CM

## 2020-12-01 DIAGNOSIS — M5442 Lumbago with sciatica, left side: Secondary | ICD-10-CM

## 2020-12-01 DIAGNOSIS — D696 Thrombocytopenia, unspecified: Secondary | ICD-10-CM

## 2020-12-01 DIAGNOSIS — F5104 Psychophysiologic insomnia: Secondary | ICD-10-CM

## 2020-12-01 DIAGNOSIS — M170 Bilateral primary osteoarthritis of knee: Secondary | ICD-10-CM

## 2020-12-01 DIAGNOSIS — R7989 Other specified abnormal findings of blood chemistry: Secondary | ICD-10-CM

## 2020-12-01 DIAGNOSIS — Z1159 Encounter for screening for other viral diseases: Secondary | ICD-10-CM

## 2020-12-01 DIAGNOSIS — L309 Dermatitis, unspecified: Secondary | ICD-10-CM

## 2020-12-01 DIAGNOSIS — D649 Anemia, unspecified: Secondary | ICD-10-CM

## 2020-12-01 DIAGNOSIS — E559 Vitamin D deficiency, unspecified: Secondary | ICD-10-CM

## 2020-12-01 MED ORDER — PANTOPRAZOLE SODIUM 40 MG PO TBEC
40.0000 mg | DELAYED_RELEASE_TABLET | Freq: Every day | ORAL | 1 refills | Status: DC
  Filled 2020-12-01: qty 30, 30d supply, fill #0

## 2020-12-01 MED ORDER — TRIAMCINOLONE ACETONIDE 0.5 % EX OINT
1.0000 "application " | TOPICAL_OINTMENT | Freq: Two times a day (BID) | CUTANEOUS | 0 refills | Status: DC
  Filled 2020-12-01: qty 30, 15d supply, fill #0

## 2020-12-01 MED ORDER — LISINOPRIL-HYDROCHLOROTHIAZIDE 10-12.5 MG PO TABS
1.0000 | ORAL_TABLET | Freq: Every day | ORAL | 1 refills | Status: DC
  Filled 2020-12-01: qty 30, 30d supply, fill #0

## 2020-12-01 MED ORDER — MELOXICAM 15 MG PO TABS
15.0000 mg | ORAL_TABLET | Freq: Every day | ORAL | 1 refills | Status: DC
  Filled 2020-12-01: qty 30, 30d supply, fill #0

## 2020-12-01 MED ORDER — CYCLOBENZAPRINE HCL 10 MG PO TABS
10.0000 mg | ORAL_TABLET | Freq: Three times a day (TID) | ORAL | 0 refills | Status: DC | PRN
  Filled 2020-12-01: qty 30, 10d supply, fill #0

## 2020-12-01 MED ORDER — TRAZODONE HCL 50 MG PO TABS
50.0000 mg | ORAL_TABLET | Freq: Every evening | ORAL | 1 refills | Status: DC | PRN
  Filled 2020-12-01: qty 30, 30d supply, fill #0

## 2020-12-01 MED ORDER — GABAPENTIN 300 MG PO CAPS
300.0000 mg | ORAL_CAPSULE | Freq: Three times a day (TID) | ORAL | 1 refills | Status: DC
  Filled 2020-12-01: qty 90, 30d supply, fill #0

## 2020-12-01 MED ORDER — OLANZAPINE 5 MG PO TABS
5.0000 mg | ORAL_TABLET | Freq: Every day | ORAL | 1 refills | Status: DC
  Filled 2020-12-01: qty 30, 30d supply, fill #0

## 2020-12-01 NOTE — Patient Instructions (Signed)
I have sent medication refills to you pharmacy.  I do encourage you to check your blood pressure at home on a daily basis, keep a written log and have available for all office visits.  We will call you with your lab results.  Kennieth Rad, PA-C Physician Assistant [place] [email or web address]   Managing Stress, Adult Feeling a certain amount of stress is normal. Stress helps our body and mind get ready to deal with the demands of life. Stress hormones can motivate you to do well at work and meet your responsibilities. However severe or long-lasting (chronic) stress can affect your mental and physical health. Chronic stress puts you at higher risk for anxiety, depression, and other health problems like digestive problems, muscle aches, heart disease, high blood pressure, and stroke. What are the causes? Common causes of stress include: Demands from work, such as deadlines, feeling overworked, or having long hours. Pressures at home, such as money issues, disagreements with a spouse, or parenting issues. Pressures from major life changes, such as divorce, moving, loss of a loved one, or chronic illness. You may be at higher risk for stress-related problems if you do not get enough sleep, are in poor health, do not have emotional support, or have a mental health disorder like anxiety or depression. How to recognize stress Stress can make you: Have trouble sleeping. Feel sad, anxious, irritable, or overwhelmed. Lose your appetite. Overeat or want to eat unhealthy foods. Want to use drugs or alcohol. Stress can also cause physical symptoms, such as: Sore, tense muscles, especially in the shoulders and neck. Headaches. Trouble breathing. A faster heart rate. Stomach pain, nausea, or vomiting. Diarrhea or constipation. Trouble concentrating. Follow these instructions at home: Lifestyle Identify the source of your stress and  your reaction to it. See a therapist who can help you change your reactions. When there are stressful events: Talk about it with family, friends, or co-workers. Try to think realistically about stressful events and not ignore them or overreact. Try to find the positives in a stressful situation and not focus on the negatives. Cut back on responsibilities at work and home, if possible. Ask for help from friends or family members if you need it. Find ways to cope with stress, such as: Meditation. Deep breathing. Yoga or tai chi. Progressive muscle relaxation. Doing art, playing music, or reading. Making time for fun activities. Spending time with family and friends. Get support from family, friends, or spiritual resources. Eating and drinking Eat a healthy diet. This includes: Eating foods that are high in fiber, such as beans, whole grains, and fresh fruits and vegetables. Limiting foods that are high in fat and processed sugars, such as fried and sweet foods. Do not skip meals or overeat. Drink enough fluid to keep your urine pale yellow. Alcohol use Do not drink alcohol if: Your health care provider tells you not to drink. You are pregnant, may be pregnant, or are planning to become pregnant. Drinking alcohol is a way some people try to ease their stress. This can be dangerous, so if you drink alcohol: Limit how much you use to: 0-1 drink a day for women. 0-2 drinks a day for men. Be aware of how much alcohol is in your drink. In the [place] one drink equals one 12 oz bottle of beer (355 mL), one 5 oz glass of wine (148 mL), or one 1 oz glass of hard liquor (44 mL). Activity  Include 30 minutes of exercise in your  daily schedule. Exercise is a good stress reducer. Include time in your day for an activity that you find relaxing. Try taking a walk, going on a bike ride, reading a book, or listening to music. Schedule your time in a way that lowers stress, and keep a consistent  schedule. Prioritize what is most important to get done. General instructions Get enough sleep. Try to go to sleep and get up at about the same time every day. Take over-the-counter and prescription medicines only as told by your health care provider. Do not use any products that contain nicotine or tobacco, such as cigarettes, e-cigarettes, and chewing tobacco. If you need help quitting, ask your health care provider. Do not use drugs or smoke to cope with stress. Keep all follow-up visits as told by your health care provider. This is important. Where to find support Talk with your health care provider about stress management or finding a support group. Find a therapist to work with you on your stress management techniques. Contact a health care provider if: Your stress symptoms get worse. You are unable to manage your stress at home. You are struggling to stop using drugs or alcohol. Get help right away if: You may be a danger to yourself or others. You have any thoughts of death or suicide. If you ever feel like you may hurt yourself or others, or have thoughts about taking your own life, get help right away. You can go to your nearest emergency department or call: Your local emergency services (911 in the [place] A suicide crisis helpline, such as the [place] at 915-007-9035. This is open 24 hours a day. Summary Feeling a certain amount of stress is normal, but severe or long-lasting (chronic) stress can affect your mental and physical health. Chronic stress can put you at higher risk for anxiety, depression, and other health problems like digestive problems, muscle aches, heart disease, high blood pressure, and stroke. You may be at higher risk for stress-related problems if you do not get enough sleep, are in poor health, lack emotional support, or have a mental health disorder like anxiety or depression. Identify the source of your stress and your reaction  to it. Try talking about stressful events with family, friends, or co-workers, finding a coping method, or getting support from spiritual resources. If you need more help, talk with your health care provider about finding a support group or a mental health therapist. This information is not intended to replace advice given to you by your health care provider. Make sure you discuss any questions you have with your health care provider. Document Revised: 05/07/2020 Document Reviewed: 09/25/2018 Elsevier Patient Education  [place].

## 2020-12-01 NOTE — Progress Notes (Signed)
New Patient Office Visit  Subjective:  Patient ID: Kristin [name], female    DOB: Oct 24, 1975  Age: 45 y.o. MRN: [id number]  CC:  Chief Complaint  Patient presents with   Annual Exam    HPI Kristin [name] requests medication refills.  Reports that she is unable to go back to her primary care provider due to insurance issues.  Reports that she tore her ACL on her right knee "years ago", states that she has chronic arthritis in both knees.  Reports that she has been taking gabapentin and meloxicam states that the gabapentin does offer some relief, states that she has been out of of the meloxicam.  States that she has previously had physical therapy, states that she felt it made her pain worse.  Reports that she does experience heartburn and acid reflux, states that she uses Protonix with relief.  States that she is not currently taking any medication to help her with her mood, does endorse that she feels she has good and bad days, but does states she feels she has more good than bad.  Does report that the Zyprexa did not help her feel more balanced.   Does state that she has difficulty sleeping, both falling asleep and staying asleep.  Reports that the trazodone does offer relief.  Reports that she does not check her blood pressure at home, states that she last took her blood pressure medication yesterday.  Hypertensive symptoms.  States that she is not taking iron on a daily basis, states that she was not aware she needed to take this.     Past Medical History:  Diagnosis Date   Acid reflux    ACL tear 2011   not sure which side   Acute lateral meniscus tear of right knee    Anxiety    Arthritis    Back pain    slipped disc lower back lumbar   Bipolar 1 disorder (HCC)    Depression    Dysfunctional uterine bleeding    Eczema    Hypertension    Insomnia    Migraine headache    Neuromuscular disorder (HCC)    leg cramps    Past Surgical History:  Procedure Laterality  Date   BIOPSY  09/19/2017   Procedure: BIOPSY;  Surgeon: Kathi Der, MD;  Location: [facility];  Service: Gastroenterology;;   BIOPSY  10/15/2019   Procedure: BIOPSY;  Surgeon: Charlott Rakes, MD;  Location: [facility];  Service: Endoscopy;;   COLONOSCOPY N/A 10/15/2019   Procedure: COLONOSCOPY;  Surgeon: Charlott Rakes, MD;  Location: [facility];  Service: Endoscopy;  Laterality: N/A;   DILITATION & CURRETTAGE/HYSTROSCOPY WITH HYDROTHERMAL ABLATION N/A 04/04/2017   Procedure: DILATATION & CURETTAGE/HYSTEROSCOPY WITH HYDROTHERMAL ABLATION;  Surgeon: Reva Bores, MD;  Location: [facility];  Service: Gynecology;  Laterality: N/A;   ESOPHAGOGASTRODUODENOSCOPY N/A 10/15/2019   Procedure: ESOPHAGOGASTRODUODENOSCOPY (EGD);  Surgeon: Charlott Rakes, MD;  Location: [facility];  Service: Endoscopy;  Laterality: N/A;   ESOPHAGOGASTRODUODENOSCOPY (EGD) WITH PROPOFOL N/A 09/19/2017   Procedure: ESOPHAGOGASTRODUODENOSCOPY (EGD) WITH PROPOFOL;  Surgeon: Kathi Der, MD;  Location: [facility];  Service: Gastroenterology;  Laterality: N/A;   KNEE ARTHROSCOPY WITH LATERAL MENISECTOMY Right 09/30/2019   Procedure: KNEE ARTHROSCOPY WITH LATERAL MENISECTOMY;  Surgeon: Sheral Apley, MD;  Location: [facility];  Service: Orthopedics;  Laterality: Right;   TUBAL LIGATION  yrs ago    Family History  Problem Relation Age of Onset   Breast cancer Maternal  Grandmother    Hypertension Mother    Hypertension Brother    Colon cancer Maternal Uncle    Bipolar disorder Cousin    Schizophrenia Cousin     Social History   Socioeconomic History   Marital status: Single    Spouse name: Not on file   Number of children: 3   Years of education: Not on file   Highest education level: High school graduate  Occupational History   Not on file  Tobacco Use   Smoking status: Some Days    Packs/day: 0.25    Years: 5.00    Pack years: 1.25    Types: Cigarettes    Smokeless tobacco: Never  Vaping Use   Vaping Use: Never used  Substance and Sexual Activity   Alcohol use: Yes    Comment: occassionally, longer than one week ago   Drug use: No   Sexual activity: Yes    Birth control/protection: Surgical  Other Topics Concern   Not on file  Social History Narrative   Not on file   Social Determinants of Health   Financial Resource Strain: Not on file  Food Insecurity: Not on file  Transportation Needs: No Transportation Needs   Lack of Transportation (Medical): No   Lack of Transportation (Non-Medical): No  Physical Activity: Not on file  Stress: Stress Concern Present   Feeling of Stress : Very much  Social Connections: Not on file  Intimate Partner Violence: Not on file    ROS Review of Systems  Constitutional:  Negative for chills and fever.  HENT: Negative.    Eyes: Negative.   Respiratory:  Negative for shortness of breath.   Cardiovascular:  Negative for chest pain.  Gastrointestinal: Negative.   Endocrine: Negative.   Genitourinary: Negative.   Musculoskeletal:  Positive for arthralgias, back pain and myalgias.  Skin: Negative.   Allergic/Immunologic: Negative.   Neurological: Negative.   Hematological: Negative.   Psychiatric/Behavioral:  Positive for sleep disturbance. Negative for dysphoric mood, self-injury and suicidal ideas. The patient is not nervous/anxious.    Objective:   Today's Vitals: BP (!) 146/100 (BP Location: Left Arm, Patient Position: Sitting, Cuff Size: Normal)   Pulse 75   Temp 98.7 F (37.1 C) (Oral)   Resp 18   Ht 5' 4.5" (1.638 m)   Wt 172 lb (78 kg)   SpO2 100%   BMI 29.07 kg/m   Physical Exam Vitals and nursing note reviewed.  Constitutional:      Appearance: Normal appearance.  HENT:     Head: Normocephalic and atraumatic.     Right Ear: External ear normal.     Left Ear: External ear normal.     Nose: Nose normal.     Mouth/Throat:     Mouth: Mucous membranes are moist.      Pharynx: Oropharynx is clear.  Eyes:     Extraocular Movements: Extraocular movements intact.     Conjunctiva/sclera: Conjunctivae normal.     Pupils: Pupils are equal, round, and reactive to light.  Cardiovascular:     Rate and Rhythm: Normal rate and regular rhythm.     Pulses: Normal pulses.     Heart sounds: Normal heart sounds.  Pulmonary:     Effort: Pulmonary effort is normal.     Breath sounds: Normal breath sounds.  Musculoskeletal:     Cervical back: Normal, normal range of motion and neck supple.     Thoracic back: Tenderness present. No swelling. Decreased range of motion.  Lumbar back: Tenderness present. No swelling. Decreased range of motion.     Right knee: Bony tenderness and crepitus present. No swelling.     Left knee: Bony tenderness and crepitus present. No swelling.  Skin:    General: Skin is warm and dry.  Neurological:     General: No focal deficit present.     Mental Status: She is alert and oriented to person, place, and time.  Psychiatric:        Mood and Affect: Mood normal.        Behavior: Behavior normal.        Thought Content: Thought content normal.        Judgment: Judgment normal.    Assessment & Plan:   Problem List Items Addressed This Visit       Cardiovascular and Mediastinum   Essential hypertension   Relevant Medications   lisinopril-hydrochlorothiazide (ZESTORETIC) 10-12.5 MG tablet   Other Relevant Orders   Comp. Metabolic Panel (12) (Completed)     Digestive   Gastroesophageal reflux disease without esophagitis   Relevant Medications   pantoprazole (PROTONIX) 40 MG tablet     Nervous and Auditory   Chronic bilateral low back pain with bilateral sciatica   Relevant Medications   gabapentin (NEURONTIN) 300 MG capsule   meloxicam (MOBIC) 15 MG tablet   cyclobenzaprine (FLEXERIL) 10 MG tablet   traZODone (DESYREL) 50 MG tablet   OLANZapine (ZYPREXA) 5 MG tablet     Musculoskeletal and Integument   Eczema   Relevant  Medications   triamcinolone ointment (KENALOG) 0.5 %   Primary osteoarthritis of both knees   Relevant Medications   meloxicam (MOBIC) 15 MG tablet   cyclobenzaprine (FLEXERIL) 10 MG tablet     Other   Bipolar 1 disorder (HCC) (Chronic)   Relevant Medications   OLANZapine (ZYPREXA) 5 MG tablet   Other Relevant Orders   Vitamin D, 25-hydroxy (Completed)   Insomnia   Relevant Medications   traZODone (DESYREL) 50 MG tablet   Generalized anxiety disorder   Relevant Medications   traZODone (DESYREL) 50 MG tablet   Other Relevant Orders   TSH (Completed)   Anemia - Primary   Relevant Orders   CBC with Differential/Platelet (Completed)   Iron, TIBC and Ferritin Panel (Completed)   Thrombocytopenia (HCC)   Other Visit Diagnoses     Encounter for HCV screening test for low risk patient       Relevant Orders   HCV Ab w Reflex to Quant PCR (Completed)   Screening, lipid       Relevant Orders   Lipid panel (Completed)       Outpatient Encounter Medications as of 12/01/2020  Medication Sig   acetaminophen (TYLENOL) 500 MG tablet Take 1 tablet (500 mg total) by mouth every 8 (eight) hours as needed for mild pain.   cyclobenzaprine (FLEXERIL) 10 MG tablet Take 1 tablet (10 mg total) by mouth 3 (three) times daily as needed for muscle spasms.   diphenhydrAMINE (BENADRYL) 25 MG tablet Take 25 mg by mouth every 6 (six) hours as needed for allergies, sleep or itching.   ondansetron (ZOFRAN) 4 MG tablet Take 1 tablet (4 mg total) by mouth every 8 (eight) hours as needed for nausea or vomiting.   thiamine 100 MG tablet Take 1 tablet (100 mg total) by mouth daily.   triamcinolone ointment (KENALOG) 0.5 % Apply cream to affected area(s) two times daily.   [DISCONTINUED] gabapentin (NEURONTIN) 300 MG capsule Take 1  capsule (300 mg total) by mouth 3 (three) times daily.   [DISCONTINUED] lisinopril-hydrochlorothiazide (ZESTORETIC) 10-12.5 MG tablet Take 1 tablet by mouth daily.   [DISCONTINUED]  meloxicam (MOBIC) 15 MG tablet Take 1 tablet (15 mg total) by mouth daily.   [DISCONTINUED] OLANZapine (ZYPREXA) 5 MG tablet TAKE 1 TABLET(5 MG) BY MOUTH AT BEDTIME (Patient taking differently: Take 5 mg by mouth at bedtime.)   [DISCONTINUED] pantoprazole (PROTONIX) 40 MG tablet Take 1 tablet (40 mg total) by mouth daily.   [DISCONTINUED] tiZANidine (ZANAFLEX) 4 MG tablet TAKE 1 TO 2 TABLETS(4 TO 8 MG) BY MOUTH EVERY 6 HOURS AS NEEDED FOR MUSCLE SPASMS (Patient taking differently: Take 4-8 mg by mouth every 6 (six) hours as needed for muscle spasms.)   [DISCONTINUED] traZODone (DESYREL) 100 MG tablet TAKE 1/2 TO 1 TABLET(50 TO 100 MG) BY MOUTH AT BEDTIME AS NEEDED FOR SLEEP   ferrous sulfate 325 (65 FE) MG tablet Take 1 tablet (325 mg total) by mouth daily. (Patient not taking: Reported on 12/01/2020)   folic acid (FOLVITE) 1 MG tablet Take 1 tablet (1 mg total) by mouth daily. (Patient not taking: Reported on 12/01/2020)   gabapentin (NEURONTIN) 300 MG capsule Take 1 capsule (300 mg total) by mouth 3 (three) times daily.   lisinopril-hydrochlorothiazide (ZESTORETIC) 10-12.5 MG tablet Take 1 tablet by mouth daily.   meloxicam (MOBIC) 15 MG tablet Take 1 tablet (15 mg total) by mouth daily.   OLANZapine (ZYPREXA) 5 MG tablet Take 1 tablet (5 mg total) by mouth at bedtime.   pantoprazole (PROTONIX) 40 MG tablet Take 1 tablet (40 mg total) by mouth daily.   traZODone (DESYREL) 50 MG tablet Take 1 tablet (50 mg total) by mouth at bedtime as needed for sleep.   [DISCONTINUED] cephALEXin (KEFLEX) 500 MG capsule Take 1 capsule (500 mg total) by mouth 2 (two) times daily.   [DISCONTINUED] fluticasone (FLONASE) 50 MCG/ACT nasal spray Place 2 sprays into both nostrils daily. (Patient taking differently: Place 2 sprays into both nostrils daily as needed for allergies. )   [DISCONTINUED] promethazine (PHENERGAN) 25 MG tablet Take 1 tablet (25 mg total) by mouth every 6 (six) hours as needed for nausea or vomiting.    No facility-administered encounter medications on file as of 12/01/2020.  1. Anemia, unspecified type Patient was given application for [facility] health financial assistance.  Patient was given appointment to establish care with Dr. Andrey Campanile at [facility] on January 11, 2021.  [facility] due to financial constraints.  - CBC with Differential/Platelet - Iron, TIBC and Ferritin Panel  2. Bipolar 1 disorder (HCC) Resume Zyprexa - OLANZapine (ZYPREXA) 5 MG tablet; Take 1 tablet (5 mg total) by mouth at bedtime.  Dispense: 30 tablet; Refill: 1 - Vitamin D, 25-hydroxy  3. Generalized anxiety disorder  - TSH  4. Essential hypertension Continue current regimen, patient encouraged to check blood pressure at home, keep a written log and have available for all office visits. - lisinopril-hydrochlorothiazide (ZESTORETIC) 10-12.5 MG tablet; Take 1 tablet by mouth daily.  Dispense: 30 tablet; Refill: 1 - Comp. Metabolic Panel (12)  5. Thrombocytopenia (HCC)   6. Chronic bilateral low back pain with bilateral sciatica Continue current regimen, trial Flexeril - gabapentin (NEURONTIN) 300 MG capsule; Take 1 capsule (300 mg total) by mouth 3 (three) times daily.  Dispense: 90 capsule; Refill: 1 - meloxicam (MOBIC) 15 MG tablet; Take 1 tablet (15 mg total) by mouth daily.  Dispense:  30 tablet; Refill: 1 - cyclobenzaprine (FLEXERIL) 10 MG tablet; Take 1 tablet (10 mg total) by mouth 3 (three) times daily as needed for muscle spasms.  Dispense: 30 tablet; Refill: 0  7. Primary osteoarthritis of both knees Continue current regimen - meloxicam (MOBIC) 15 MG tablet; Take 1 tablet (15 mg total) by mouth daily.  Dispense: 30 tablet; Refill: 1  8. Gastroesophageal reflux disease without esophagitis Continue current regimen - pantoprazole (PROTONIX) 40 MG tablet; Take 1 tablet (40 mg total) by mouth daily.  Dispense: 30 tablet; Refill: 1  9.  Psychophysiological insomnia Resume trazodone - traZODone (DESYREL) 50 MG tablet; Take 1 tablet (50 mg total) by mouth at bedtime as needed for sleep.  Dispense: 30 tablet; Refill: 1  10. Encounter for HCV screening test for low risk patient  - HCV Ab w Reflex to Quant PCR  11. Screening, lipid  - Lipid panel  12. Eczema, unspecified type Continue current regimen - triamcinolone ointment (KENALOG) 0.5 %; Apply cream to affected area(s) two times daily.  Dispense: 30 g; Refill: 0   I have reviewed the patient's medical history (PMH, PSH, Social History, Family History, Medications, and allergies) , and have been updated if relevant. I spent 30 minutes reviewing chart and  face to face time with patient.    Follow-up: Return in about 6 weeks (around 01/11/2021) for with Dr. Andrey Campanile at [facility]   [name] Mayers, PA-C

## 2020-12-01 NOTE — Progress Notes (Signed)
Patient has eaten and taken tylenol and gabapentin only today. Patient last took BP medication yesterday. Patient reports pain at a 8 in the back radiating down to the toes. Patient request refills today protonix, zofran. Meloxicam, zyprexa, zanaflex, trazadone

## 2020-12-02 ENCOUNTER — Other Ambulatory Visit: Payer: Self-pay

## 2020-12-02 DIAGNOSIS — G8929 Other chronic pain: Secondary | ICD-10-CM | POA: Insufficient documentation

## 2020-12-02 DIAGNOSIS — D696 Thrombocytopenia, unspecified: Secondary | ICD-10-CM | POA: Insufficient documentation

## 2020-12-02 DIAGNOSIS — M170 Bilateral primary osteoarthritis of knee: Secondary | ICD-10-CM | POA: Insufficient documentation

## 2020-12-02 DIAGNOSIS — K219 Gastro-esophageal reflux disease without esophagitis: Secondary | ICD-10-CM | POA: Insufficient documentation

## 2020-12-02 DIAGNOSIS — M5442 Lumbago with sciatica, left side: Secondary | ICD-10-CM | POA: Insufficient documentation

## 2020-12-02 LAB — CBC WITH DIFFERENTIAL/PLATELET
Basophils Absolute: 0 10*3/uL (ref 0.0–0.2)
Basos: 1 %
EOS (ABSOLUTE): 0.3 10*3/uL (ref 0.0–0.4)
Eos: 8 %
Hematocrit: 40.7 % (ref 34.0–46.6)
Hemoglobin: 13.5 g/dL (ref 11.1–15.9)
Immature Grans (Abs): 0 10*3/uL (ref 0.0–0.1)
Immature Granulocytes: 0 %
Lymphocytes Absolute: 0.9 10*3/uL (ref 0.7–3.1)
Lymphs: 27 %
MCH: 30.8 pg (ref 26.6–33.0)
MCHC: 33.2 g/dL (ref 31.5–35.7)
MCV: 93 fL (ref 79–97)
Monocytes Absolute: 0.4 10*3/uL (ref 0.1–0.9)
Monocytes: 11 %
Neutrophils Absolute: 1.8 10*3/uL (ref 1.4–7.0)
Neutrophils: 53 %
Platelets: 159 10*3/uL (ref 150–450)
RBC: 4.39 x10E6/uL (ref 3.77–5.28)
RDW: 14.7 % (ref 11.7–15.4)
WBC: 3.3 10*3/uL — ABNORMAL LOW (ref 3.4–10.8)

## 2020-12-02 LAB — COMP. METABOLIC PANEL (12)
AST: 41 IU/L — ABNORMAL HIGH (ref 0–40)
Albumin/Globulin Ratio: 1.9 (ref 1.2–2.2)
Albumin: 4.9 g/dL — ABNORMAL HIGH (ref 3.8–4.8)
Alkaline Phosphatase: 103 IU/L (ref 44–121)
BUN/Creatinine Ratio: 23 (ref 9–23)
BUN: 14 mg/dL (ref 6–24)
Bilirubin Total: 0.6 mg/dL (ref 0.0–1.2)
Calcium: 9.4 mg/dL (ref 8.7–10.2)
Chloride: 101 mmol/L (ref 96–106)
Creatinine, Ser: 0.61 mg/dL (ref 0.57–1.00)
Globulin, Total: 2.6 g/dL (ref 1.5–4.5)
Glucose: 82 mg/dL (ref 65–99)
Potassium: 3.8 mmol/L (ref 3.5–5.2)
Sodium: 139 mmol/L (ref 134–144)
Total Protein: 7.5 g/dL (ref 6.0–8.5)
eGFR: 112 mL/min/{1.73_m2} (ref >59)

## 2020-12-02 LAB — HCV INTERPRETATION

## 2020-12-02 LAB — IRON,TIBC AND FERRITIN PANEL
Ferritin: 115 ng/mL (ref 15–150)
Iron Saturation: 21 % (ref 15–55)
Iron: 66 ug/dL (ref 27–159)
Total Iron Binding Capacity: 320 ug/dL (ref 250–450)
UIBC: 254 ug/dL (ref 131–425)

## 2020-12-02 LAB — LIPID PANEL
Chol/HDL Ratio: 2.4 ratio (ref 0.0–4.4)
Cholesterol, Total: 306 mg/dL — ABNORMAL HIGH (ref 100–199)
HDL: 127 mg/dL (ref >39)
LDL Chol Calc (NIH): 148 mg/dL — ABNORMAL HIGH (ref 0–99)
Triglycerides: 183 mg/dL — ABNORMAL HIGH (ref 0–149)
VLDL Cholesterol Cal: 31 mg/dL (ref 5–40)

## 2020-12-02 LAB — TSH: TSH: 0.242 u[IU]/mL — ABNORMAL LOW (ref 0.450–4.500)

## 2020-12-02 LAB — HCV AB W REFLEX TO QUANT PCR: HCV Ab: <0.1 s/co ratio (ref 0.0–0.9)

## 2020-12-02 LAB — VITAMIN D 25 HYDROXY (VIT D DEFICIENCY, FRACTURES): Vit D, 25-Hydroxy: 9.2 ng/mL — ABNORMAL LOW (ref 30.0–100.0)

## 2020-12-02 MED ORDER — VITAMIN D (ERGOCALCIFEROL) 1.25 MG (50000 UNIT) PO CAPS
50000.0000 [IU] | ORAL_CAPSULE | ORAL | 2 refills | Status: DC
  Filled 2020-12-02: qty 4, 28d supply, fill #0

## 2020-12-02 NOTE — Addendum Note (Signed)
Addended by: Roney Jaffe on: 12/02/2020 03:16 PM   Modules accepted: Orders

## 2020-12-03 ENCOUNTER — Other Ambulatory Visit: Payer: Self-pay

## 2020-12-07 ENCOUNTER — Ambulatory Visit: Payer: Medicaid Other | Admitting: Family Medicine

## 2020-12-08 ENCOUNTER — Telehealth: Payer: Self-pay | Admitting: *Deleted

## 2020-12-08 NOTE — Telephone Encounter (Signed)
-----   Message from Roney Jaffe, [place] sent at 12/02/2020  3:15 PM EDT ----- Please call patient and let her know that her hemoglobin is within normal limits, her iron is within normal limits.  She does not have iron deficiency anemia and does not need to take iron at this time.  She did have 1 slightly elevated liver enzymes however it does not warrant concern, otherwise her liver function is within normal limits as well as her kidney function.  Her screening for hepatitis C was negative.  Her cholesterol is elevated, I do encourage her to have this retested and make sure that she has been fasting.   Her thyroid function is abnormal, she does need to have this retested. Her vitamin D level is low, she needs to take 50,000 units once a week for the next 12 weeks and have it retested at that time.  Prescription sent to her pharmacy

## 2020-12-08 NOTE — Telephone Encounter (Signed)
MA UTR patient x2 and LVM to return a call to [facility] at [phone].

## 2021-01-05 ENCOUNTER — Emergency Department (HOSPITAL_COMMUNITY)
Admission: EM | Admit: 2021-01-05 | Discharge: 2021-01-05 | Disposition: A | Discharge status: Home / Self Care | Payer: Medicaid Other

## 2021-01-06 NOTE — Progress Notes (Signed)
  SUBJECTIVE:   CHIEF COMPLAINT / HPI: Knee pain  Intermittent abdominal pain for years. Does have nausea and vomiting. Denies hematemesis. She has a history of GERD and ran out of pantoprazole months ago. In the interim, she has been using OTC treatments like baking soda and famotidine with relief. Denies hematemesis, blood in stool.  She also reports frequent UTIs and feels she has a UTI currently.  Reports urinary frequency. Denies dysuria.  Patient has a history of chronic pain in both knees. She has been seen by orthopedics and had a right knee arthroscopy with lateral meniscectomy in July 2021. She reports worsening of her knee pain for months. She reports she has have swelling to her knees. She has been using topical magnesium, Hemp seed oil trolamine salicylate 10%, Tylenol arthritis, ibuprofen, and naproxen.  She has had knee injections in the past, last corticosteroid injection in the right knee was in May of this year, over 5 months ago.  She is amenable to injections today. Has not been able to see orthopedics due to insurance issues.  MRI right knee 08/2019 revealed Dominant finding is advanced for age tricompartmental osteoarthritis.   Horizontal tear in the lateral meniscus extends from the junction of the posterior horn and body throughout the body into the anterior horn.   Moderate to moderately severe fatty replacement of all imaged musculature about the knee is likely secondary to disuse.  Patient states she has run out of multiple medications for months including lisinopril-HCTZ, olanzapine, trazodone, and pantoprazole as noted above.  PERTINENT  PMH / PSH: HTN, migraines, esophagitis, GERD, bipolar disorder  OBJECTIVE:   BP (!) 148/88   Pulse 84   Ht 5' 4" (1.626 m)   Wt 178 lb (80.7 kg)   PF 98 L/min   BMI 30.55 kg/m   General: Middle-aged female, NAD MSK: bilateral knees with no obvious deformity or swelling. There is tenderness to the medial and lateral  joint lines bilaterally. Pain elicited with full passive extension and resisted extension bilaterally. NVI.  Knee Injection Procedure Note  Pre-operative Diagnosis: bilateral knee OA  Post-operative Diagnosis: same  Indications: Symptom relief from osteoarthritis  Anesthesia: not required     Procedure Details   Verbal consent was obtained for the procedure. The right knee joint was prepped with Betadine and cleansed with alcohol swab. A 22 gauge needle was inserted using the anteromedial approach. 40 mg of methylprednisolone mixed with lidocaine 1% mixed 3:1 was then injected into the joint. The needle was removed and the area cleansed and dressed.  The left knee joint was then prepped with Betadine and cleansed with alcohol swab. A 22 gauge needle was inserted using the anteromedial approach. However, there was significant resistance met despite repositioning the needle so the remainder of the procedure was aborted. The needle was removed and the area cleansed and dressed.  Complications:  None; patient tolerated the procedure well.  ASSESSMENT/PLAN:   Recurrent UTI Patient with recurrent UTI with UA consistent with UTI with positive leukocytes and nitrites.  Will treat empirically with cephalexin and adjust as needed pending urine culture results.   Essential hypertension Elevated in office in the setting of running out of medication.  Lisinopril-HCTZ refilled today, patient advised to follow-up PCP within 1 month.  Primary osteoarthritis of both knees Patient with chronic bilateral knee pain with significant OA demonstrated in the right knee on imaging. CSI performed on right knee as above, was not able to successfully inject the left knee.   She has not had any imaging of her left knee so will order XR. Will also refer to sports medicine for further management, may benefit from US-guided CSI of the left knee. Advised to avoid NSAIDs due to GERD and abdominal pain.  Esophagitis  determined by endoscopy Abdominal pain is chronic possibly due to underlying GERD/esophagitis and having run out of her PPI. Possible component of gastritis given NSAID use. Advised to avoid NSAIDs. Pantoprazole refilled, consider further workup if not improved.   Medications refilled today as requested. Advised to follow-up closely with PCP for multiple medical issues.  Zola Button, MD [place]

## 2021-01-07 ENCOUNTER — Ambulatory Visit (INDEPENDENT_AMBULATORY_CARE_PROVIDER_SITE_OTHER): Payer: Self-pay | Admitting: Family Medicine

## 2021-01-07 ENCOUNTER — Other Ambulatory Visit: Payer: Self-pay

## 2021-01-07 VITALS — BP 148/88 | HR 84 | Ht 64.0 in | Wt 178.0 lb

## 2021-01-07 DIAGNOSIS — K209 Esophagitis, unspecified without bleeding: Secondary | ICD-10-CM

## 2021-01-07 DIAGNOSIS — M170 Bilateral primary osteoarthritis of knee: Secondary | ICD-10-CM

## 2021-01-07 DIAGNOSIS — M25561 Pain in right knee: Secondary | ICD-10-CM

## 2021-01-07 DIAGNOSIS — M25562 Pain in left knee: Secondary | ICD-10-CM

## 2021-01-07 DIAGNOSIS — G8929 Other chronic pain: Secondary | ICD-10-CM

## 2021-01-07 DIAGNOSIS — F319 Bipolar disorder, unspecified: Secondary | ICD-10-CM

## 2021-01-07 DIAGNOSIS — N390 Urinary tract infection, site not specified: Secondary | ICD-10-CM

## 2021-01-07 DIAGNOSIS — R350 Frequency of micturition: Secondary | ICD-10-CM

## 2021-01-07 DIAGNOSIS — F5104 Psychophysiologic insomnia: Secondary | ICD-10-CM

## 2021-01-07 DIAGNOSIS — K219 Gastro-esophageal reflux disease without esophagitis: Secondary | ICD-10-CM

## 2021-01-07 DIAGNOSIS — I10 Essential (primary) hypertension: Secondary | ICD-10-CM

## 2021-01-07 LAB — POCT UA - MICROSCOPIC ONLY

## 2021-01-07 LAB — POCT URINALYSIS DIP (MANUAL ENTRY)
Glucose, UA: NEGATIVE mg/dL
Nitrite, UA: POSITIVE — AB
Protein Ur, POC: 100 mg/dL — AB
Spec Grav, UA: 1.02 (ref 1.010–1.025)
Urobilinogen, UA: 1 E.U./dL
pH, UA: 6 (ref 5.0–8.0)

## 2021-01-07 MED ORDER — TRAZODONE HCL 50 MG PO TABS
50.0000 mg | ORAL_TABLET | Freq: Every evening | ORAL | 1 refills | Status: DC | PRN
  Filled 2021-01-07: qty 30, 30d supply, fill #0

## 2021-01-07 MED ORDER — CEPHALEXIN 500 MG PO CAPS
500.0000 mg | ORAL_CAPSULE | Freq: Two times a day (BID) | ORAL | 0 refills | Status: AC
  Filled 2021-01-07: qty 14, 7d supply, fill #0

## 2021-01-07 MED ORDER — METHYLPREDNISOLONE ACETATE 40 MG/ML IJ SUSP
40.0000 mg | Freq: Once | INTRAMUSCULAR | Status: AC
Start: 2021-01-07 — End: 2021-01-07
  Administered 2021-01-07: 40 mg via INTRAMUSCULAR

## 2021-01-07 MED ORDER — PANTOPRAZOLE SODIUM 40 MG PO TBEC
40.0000 mg | DELAYED_RELEASE_TABLET | Freq: Every day | ORAL | 1 refills | Status: DC
  Filled 2021-01-07: qty 30, 30d supply, fill #0

## 2021-01-07 MED ORDER — LISINOPRIL-HYDROCHLOROTHIAZIDE 10-12.5 MG PO TABS
1.0000 | ORAL_TABLET | Freq: Every day | ORAL | 1 refills | Status: DC
  Filled 2021-01-07 – 2021-04-19 (×3): qty 30, 30d supply, fill #0

## 2021-01-07 MED ORDER — OLANZAPINE 5 MG PO TABS
5.0000 mg | ORAL_TABLET | Freq: Every day | ORAL | 1 refills | Status: DC
  Filled 2021-01-07: qty 30, 30d supply, fill #0

## 2021-01-07 NOTE — Assessment & Plan Note (Signed)
Elevated in office in the setting of running out of medication.  Lisinopril-HCTZ refilled today, patient advised to follow-up PCP within 1 month.

## 2021-01-07 NOTE — Assessment & Plan Note (Signed)
Patient with chronic bilateral knee pain with significant OA demonstrated in the right knee on imaging. CSI performed on right knee as above, was not able to successfully inject the left knee. She has not had any imaging of her left knee so will order XR. Will also refer to sports medicine for further management, may benefit from US-guided CSI of the left knee. Advised to avoid NSAIDs due to GERD and abdominal pain.

## 2021-01-07 NOTE — Assessment & Plan Note (Signed)
Abdominal pain is chronic possibly due to underlying GERD/esophagitis and having run out of her PPI. Possible component of gastritis given NSAID use. Advised to avoid NSAIDs. Pantoprazole refilled, consider further workup if not improved.

## 2021-01-07 NOTE — Assessment & Plan Note (Addendum)
Patient with recurrent UTI with UA consistent with UTI with positive leukocytes and nitrites.  Will treat empirically with cephalexin and adjust as needed pending urine culture results.

## 2021-01-07 NOTE — Patient Instructions (Addendum)
It was nice seeing you today!  Try to avoid taking anti-inflammatory medications such as ibuprofen, naproxen, meloxicam as these will worsen your acid reflux. Tylenol is safe to take.  I have refilled your Protonix and other medications.  Take antibiotics for 7 days for UTI. I will call you if we need to change antibiotics.  Get an X-ray of your left knee. You do not need an appointment. I will place a referral to sports medicine. They should be calling you with an appointment in the next 2 weeks.  [facility] Address: [address], [place], [place] [address] Phone: [phone]   Follow-up with your PCP within the next month regarding blood pressure and other concerns.  Please arrive at least 15 minutes prior to your scheduled appointments.  Stay well, Littie Deeds, MD [facility] [phone]

## 2021-01-10 ENCOUNTER — Other Ambulatory Visit: Payer: Self-pay

## 2021-01-11 ENCOUNTER — Ambulatory Visit: Payer: Medicaid Other | Admitting: Family Medicine

## 2021-01-12 ENCOUNTER — Ambulatory Visit: Payer: Medicaid Other | Admitting: Licensed Clinical Social Worker

## 2021-01-12 DIAGNOSIS — Z789 Other specified health status: Secondary | ICD-10-CM

## 2021-01-12 NOTE — Chronic Care Management (AMB) (Signed)
Care Management  Clinical Social Work Note  01/12/2021 Name: Kristin [name] MRN: [id number] DOB: 08-Mar-1976  Kristin [name] is a 45 y.o. year old female who is a primary care patient of Derrel Nip, MD. The CCM team was consulted for assistance with care coordination needs. Mental Health Counseling and Resources   Consent to Services:  The patient was given information about Care Management services, agreed to services, and gave verbal consent prior to initiation of services.  Please see initial visit note for detailed documentation.   Patient agreed to services today and consent obtained.  Engaged with patient by telephone for follow up visit in response to provider referral for social work care coordination services.   Assessment/Interventions:  Patient is doing well with managing her mental health, main focus is her physical health and getting insurance.  She has all needed information to connect to community support .   Follow up Plan:  Patient does not require or desire continued follow-up by LCSW. Will contact the office if needed Patient may benefit from and is in agreement for CCM LCSW to remain part of care team for the next 60 days.  If no needs are identified in the next 60 days, CCM LCSW will disconnect from the care team.   Review of patient past medical history, allergies, medications, and health status, including review of pertinent consultant reports was performed as part of comprehensive evaluation and provision of care management/care coordination services.   SDOH (Social Determinants of Health) screening and interventions performed today:  SDOH Interventions    Flowsheet Row Most Recent Value  SDOH Interventions   SDOH Interventions for the Following Domains Depression  Depression Interventions/Treatment  Referral to Psychiatry       Advanced Directives Status:Not addressed in this encounter.     Care Plan    Conditions to be addressed/monitored per PCP  order: Bipolar Disorder,   Care Plan : General Social Work (Adult)  Updates made by Soundra Pilon, LCSW since 01/12/2021 12:00 AM     Problem: Emotional Distress      Goal: Emotional Health Supported by connecting  with mental healt provider   Start Date: 09/22/2020  This Visit's Progress: On track  Recent Progress: Not on track  Priority: High  Note:   Current Barriers:  Knowledge Deficits related to plan of care for management of Anxiety and Bipolar Disorder  Care Coordination needs related to Housing barriers in a patient with Anxiety and Bipolar Disorder No insurance; work schedule Social Work [employer] Clinical Goal(s):  Patient will work with [employer] clinical Child psychotherapist to connect with mental health provider  through collaboration with care team.   Interventions: Inter-disciplinary care team collaboration (see longitudinal plan of care) Assessed patient's progress, coping skills, support system and barriers to care  Solution-Focused Strategies, task Centered; motivational interviewing;   Connect with Mental Health provider  (Status: Goal on track: NO.   ) Evaluation of current treatment plan related to Anxiety and Bipolar Disorder,   Discussed plans with patient for ongoing care management follow up and provided patient with direct contact information for care management team Provided patient with phone number for [facility] and [facility] the [place] also discussed walk-in hours  Patient Goals/Self-Care Activities: [facility] [phone] to schedule your appointment Walk-in to [facility] if you have a crisis [facility] the [place] [phone]    Problem: Medication Adherence / unable to afford medication  Goal: physical Health concerns managed   Start Date: 10/12/2020  This Visit's Progress: On track  Recent Progress: On track  Note:   Current barriers:    Financial constraints  related to no insurance  Clinical Goals: Patient will work with agencies discussed to address needs related to unmet health needs  Clinical Interventions:  Inter-disciplinary care team collaboration (see longitudinal plan of care) Assessment of needs, barriers , agencies contacted, as well as how impacting Review various resources,  Reviewed insurance options ( [employer] Card, [employer] Assistance and [employer] Act) Solution-Focused Strategies, Active listening / Reflection utilized , and Problem Solving /Task Center  Patient Goals/Self-Care Activities:  Emergency planning/management officer Place to discuss insurance options  [email or web address]   Call [phone]     Sammuel Hines, LCSW Care Management & Coordination  [facility] / [employer]   [phone]

## 2021-01-12 NOTE — Patient Instructions (Signed)
Licensed Clinical Social Worker Visit Information  Goals we discussed today:   Goals Addressed             This Visit's Progress    Begin and Stick with Counseling-Depression   On track    Timeframe:  Short-Term Goal Priority:  High Start Date: 09/22/2020                            Expected End Date:                        Patient Goals/Self-Care Activities: [facility] [phone] to schedule your appointment Walk-in to [facility] if you have a crisis [facility] [place] [phone]         Physical health concerned managed   On track    Patient Goals/Self-Care Activities:  Call [employer] to discuss insurance options  [email or web address]   Call [phone]        Patient agreed to services and verbal consent obtained.   The patient verbalized understanding of instructions, educational materials, and care plan provided today and declined offer to receive copy of patient instructions, educational materials, and care plan.   It was a pleasure speaking with you today. Please call the office if needed Per our conversation I will remain part of your care team for the next 60 days.  If no needs are identified in the next 60 days, I will disconnect from the care team.  Sammuel Hines, LCSW Care Management & Coordination  [phone]

## 2021-01-14 LAB — URINE CULTURE

## 2021-02-21 ENCOUNTER — Other Ambulatory Visit: Payer: Self-pay

## 2021-03-21 ENCOUNTER — Other Ambulatory Visit: Payer: Self-pay

## 2021-03-22 ENCOUNTER — Other Ambulatory Visit: Payer: Self-pay

## 2021-03-28 ENCOUNTER — Other Ambulatory Visit: Payer: Self-pay

## 2021-04-18 ENCOUNTER — Encounter: Payer: Self-pay | Admitting: Family Medicine

## 2021-04-18 ENCOUNTER — Other Ambulatory Visit: Payer: Self-pay

## 2021-04-18 ENCOUNTER — Ambulatory Visit (INDEPENDENT_AMBULATORY_CARE_PROVIDER_SITE_OTHER): Payer: 59 | Admitting: Family Medicine

## 2021-04-18 VITALS — BP 164/107 | HR 77 | Ht 64.0 in | Wt 190.0 lb

## 2021-04-18 DIAGNOSIS — Z87440 Personal history of urinary (tract) infections: Secondary | ICD-10-CM

## 2021-04-18 DIAGNOSIS — M5441 Lumbago with sciatica, right side: Secondary | ICD-10-CM

## 2021-04-18 DIAGNOSIS — G8929 Other chronic pain: Secondary | ICD-10-CM

## 2021-04-18 DIAGNOSIS — M5442 Lumbago with sciatica, left side: Secondary | ICD-10-CM

## 2021-04-18 DIAGNOSIS — M1711 Unilateral primary osteoarthritis, right knee: Secondary | ICD-10-CM

## 2021-04-18 DIAGNOSIS — M25562 Pain in left knee: Secondary | ICD-10-CM

## 2021-04-18 DIAGNOSIS — M25561 Pain in right knee: Secondary | ICD-10-CM

## 2021-04-18 DIAGNOSIS — F5104 Psychophysiologic insomnia: Secondary | ICD-10-CM | POA: Diagnosis not present

## 2021-04-18 DIAGNOSIS — M549 Dorsalgia, unspecified: Secondary | ICD-10-CM

## 2021-04-18 LAB — POCT URINALYSIS DIP (MANUAL ENTRY)
Bilirubin, UA: NEGATIVE
Glucose, UA: NEGATIVE mg/dL
Ketones, POC UA: NEGATIVE mg/dL
Nitrite, UA: POSITIVE — AB
Spec Grav, UA: 1.015 (ref 1.010–1.025)
Urobilinogen, UA: 0.2 E.U./dL
pH, UA: 7 (ref 5.0–8.0)

## 2021-04-18 MED ORDER — TRAZODONE HCL 50 MG PO TABS
50.0000 mg | ORAL_TABLET | Freq: Every evening | ORAL | 1 refills | Status: DC | PRN
Start: 2021-04-18 — End: 2022-02-27
  Filled 2021-04-18: qty 30, 30d supply, fill #0

## 2021-04-18 MED ORDER — CYCLOBENZAPRINE HCL 10 MG PO TABS
10.0000 mg | ORAL_TABLET | Freq: Three times a day (TID) | ORAL | 0 refills | Status: DC | PRN
  Filled 2021-04-18: qty 30, 10d supply, fill #0

## 2021-04-18 MED ORDER — CEPHALEXIN 500 MG PO CAPS
500.0000 mg | ORAL_CAPSULE | Freq: Two times a day (BID) | ORAL | 0 refills | Status: AC
  Filled 2021-04-18: qty 28, 14d supply, fill #0

## 2021-04-18 NOTE — Patient Instructions (Addendum)
It was good seeing you today.  For your UTI I want to get a ultrasound which will need to be scheduled.  Our nurse will call you when that is scheduled.  I also would like for you to see a urologist and have placed a referral for this.  You need to take an antibiotic called Keflex twice daily for 14 days.  If your symptoms worsen, you start to have fevers please be evaluated in the emergency department.  For your pain I recommend you go see orthopedics again.  I did place a referral for pain management clinic and someone should call you for an appointment.  If you have any questions or concerns call the clinic.  I have a wonderful afternoon!

## 2021-04-18 NOTE — Progress Notes (Signed)
° ° °  SUBJECTIVE:   CHIEF COMPLAINT / HPI:   Possible uti  Patient reports history of multiple UTIs over the last year.  She reports that since her last visit her symptoms resolved but have now returned.  She has had an increased frequency, burning, back pain bilaterally.  Reports no fevers, no nausea.  Occasional abdominal pain.  Per chart review patient's frequent UTIs are E. coli.  Knee pain  Patient reports chronic bilateral knee pain.  She reports she is seen orthopedics in the past and had her knee scoped (right knee).  She reports she has had multiple injections and seeing sports medicine as well as she takes "a lot" of Tylenol and avoids ibuprofen because of concern for GI bleed.  Discussed limiting the amount of Tylenol, ice and heat, topical remedies but patient reports that none of these work.  She has had injections and reported that the last time she had injections did not work at all.  OBJECTIVE:   BP (!) 164/107    Pulse 77    Ht 5\' 4"  (1.626 m)    Wt 190 lb (86.2 kg)    SpO2 100%    BMI 32.61 kg/m   General: Pleasant 46 year old female in no acute distress Cardiac: Regular rate Respiratory: Normal for breathing, speaking in full sentences Abdomen: Soft, nontender, bilateral costovertebral angle tenderness MSK: Patient with pain to knees bilaterally, pain with flexion and extension.  No obvious injuries appreciated, no obvious swelling, no erythema noted.  No signs of infection in the joints.  ASSESSMENT/PLAN:   Osteoarthritis of right knee Chronic pain.  Has not improved since previous evaluations with treatment such as injections, topical medications, ice, heat.  She was seen by orthopedics in the past and recommended patient should follow-up with orthopedics.  She saw MR feeling her practice and reports she will call and make an appointment with them.  She is asking for chronic pain management so referral for pain management has been placed.  History of recurrent  UTIs Patient with recurrent UTIs.  Consistently having E. coli infections.  Susceptibility similar between infections.  Prescribing a longer course of antibiotics today with 14 days of Keflex twice daily.  Referral placed for urology evaluation.  Also ordered kidney ultrasound.  Strict ED and return precautions given.     55, MD [facility]

## 2021-04-19 ENCOUNTER — Other Ambulatory Visit: Payer: Self-pay

## 2021-04-19 DIAGNOSIS — Z87440 Personal history of urinary (tract) infections: Secondary | ICD-10-CM | POA: Insufficient documentation

## 2021-04-19 NOTE — Assessment & Plan Note (Signed)
Patient with recurrent UTIs.  Consistently having E. coli infections.  Susceptibility similar between infections.  Prescribing a longer course of antibiotics today with 14 days of Keflex twice daily.  Referral placed for urology evaluation.  Also ordered kidney ultrasound.  Strict ED and return precautions given.

## 2021-04-19 NOTE — Assessment & Plan Note (Signed)
Chronic pain.  Has not improved since previous evaluations with treatment such as injections, topical medications, ice, heat.  She was seen by orthopedics in the past and recommended patient should follow-up with orthopedics.  She saw MR feeling her practice and reports she will call and make an appointment with them.  She is asking for chronic pain management so referral for pain management has been placed.

## 2021-04-20 LAB — URINE CULTURE

## 2021-04-21 ENCOUNTER — Encounter: Payer: Self-pay | Admitting: Physical Medicine and Rehabilitation

## 2021-04-25 ENCOUNTER — Other Ambulatory Visit: Payer: Self-pay

## 2021-04-25 ENCOUNTER — Ambulatory Visit (HOSPITAL_COMMUNITY)
Admission: RE | Admit: 2021-04-25 | Discharge: 2021-04-25 | Disposition: A | Discharge status: Home / Self Care | Payer: 59 | Source: Ambulatory Visit | Attending: Family Medicine | Admitting: Family Medicine

## 2021-04-25 DIAGNOSIS — Z87440 Personal history of urinary (tract) infections: Secondary | ICD-10-CM | POA: Insufficient documentation

## 2021-04-26 ENCOUNTER — Other Ambulatory Visit: Payer: Self-pay

## 2021-04-29 ENCOUNTER — Telehealth: Payer: Self-pay

## 2021-04-29 NOTE — Telephone Encounter (Signed)
Heard back from Dr. Nobie Putnam. He was able to reach patient and discuss [place] results.   Veronda Prude, RN

## 2021-04-29 NOTE — Telephone Encounter (Signed)
Patient calls nurse line expressing frustration that she has not heard back regarding results of renal US. Attempted to explain protocol to patient, however, patient states that she was told she would get her results within 1-2 days after [place].   Paged provider at 1530.   Veronda Prude, RN

## 2021-05-22 ENCOUNTER — Emergency Department (HOSPITAL_COMMUNITY)
Admission: EM | Admit: 2021-05-22 | Discharge: 2021-05-22 | Disposition: A | Discharge status: Home / Self Care | Payer: 59 | Attending: Emergency Medicine | Admitting: Emergency Medicine

## 2021-05-22 ENCOUNTER — Encounter (HOSPITAL_COMMUNITY): Payer: Self-pay

## 2021-05-22 ENCOUNTER — Emergency Department (HOSPITAL_COMMUNITY): Payer: 59

## 2021-05-22 ENCOUNTER — Other Ambulatory Visit: Payer: Self-pay

## 2021-05-22 DIAGNOSIS — W25XXXA Contact with sharp glass, initial encounter: Secondary | ICD-10-CM | POA: Insufficient documentation

## 2021-05-22 DIAGNOSIS — S31821A Laceration without foreign body of left buttock, initial encounter: Secondary | ICD-10-CM | POA: Diagnosis not present

## 2021-05-22 DIAGNOSIS — S3992XA Unspecified injury of lower back, initial encounter: Secondary | ICD-10-CM | POA: Diagnosis present

## 2021-05-22 MED ORDER — LIDOCAINE-EPINEPHRINE (PF) 2 %-1:200000 IJ SOLN
20.0000 mL | Freq: Once | INTRAMUSCULAR | Status: AC
  Administered 2021-05-22: 20 mL
  Filled 2021-05-22: qty 20
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
## 2021-05-22 NOTE — Discharge Instructions (Signed)
Follow-up with your doctor in 2 weeks to have the sutures removed.   ?
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
## 2021-05-23 ENCOUNTER — Telehealth: Payer: Self-pay

## 2021-05-23 ENCOUNTER — Telehealth: Payer: Self-pay | Admitting: Surgery

## 2021-05-23 NOTE — Telephone Encounter (Addendum)
RNCM received inbound call from patient regarding recent discharge on 05/22/21. Patient reports she was given stitches with no pain medication. Per further review EDP did not prescribe any pain medications. RNCM suggested patient to take Tylenol, schedule an appointment with her PCP or she can go to urgent care to get pain medications prescribed.Patient verbalized understanding.    ?

## 2021-05-23 NOTE — Telephone Encounter (Signed)
Received call from patient regarding discharge medications. No discharge prescriptions noted, checked allergies, suggested that patient take OTC ibuprofen or acetaminophen, and follow up with PCP. Patient verbalized understanding. No further ED RNCM needs identified.  ?

## 2021-05-30 ENCOUNTER — Ambulatory Visit (INDEPENDENT_AMBULATORY_CARE_PROVIDER_SITE_OTHER): Payer: 59 | Admitting: Family Medicine

## 2021-05-30 DIAGNOSIS — Z91199 Patient's noncompliance with other medical treatment and regimen due to unspecified reason: Secondary | ICD-10-CM

## 2021-05-30 NOTE — Progress Notes (Deleted)
No show

## 2021-05-31 ENCOUNTER — Encounter: Payer: Self-pay | Admitting: Student

## 2021-05-31 ENCOUNTER — Telehealth: Payer: Self-pay | Admitting: *Deleted

## 2021-05-31 ENCOUNTER — Ambulatory Visit (INDEPENDENT_AMBULATORY_CARE_PROVIDER_SITE_OTHER): Payer: 59 | Admitting: Student

## 2021-05-31 ENCOUNTER — Other Ambulatory Visit: Payer: Self-pay

## 2021-05-31 ENCOUNTER — Telehealth: Payer: Self-pay | Admitting: Family Medicine

## 2021-05-31 VITALS — BP 128/100 | HR 106 | Ht 64.0 in | Wt 182.4 lb

## 2021-05-31 DIAGNOSIS — K0889 Other specified disorders of teeth and supporting structures: Secondary | ICD-10-CM | POA: Diagnosis not present

## 2021-05-31 MED ORDER — IBUPROFEN 800 MG PO TABS
800.0000 mg | ORAL_TABLET | Freq: Three times a day (TID) | ORAL | 0 refills | Status: DC | PRN
  Filled 2021-05-31: qty 21, 7d supply, fill #0

## 2021-05-31 MED ORDER — OXYCODONE HCL 5 MG PO TABS
5.0000 mg | ORAL_TABLET | Freq: Three times a day (TID) | ORAL | 0 refills | Status: DC
  Filled 2021-05-31: qty 10, 4d supply, fill #0

## 2021-05-31 MED ORDER — PENICILLIN VK DESENSITIZATION 1000 UNIT/ML PO
500.0000 [IU] | Freq: Four times a day (QID) | ORAL | 0 refills | Status: DC
  Filled 2021-05-31: qty 20, 10d supply, fill #0

## 2021-05-31 NOTE — Progress Notes (Signed)
?SUBJECTIVE:  ? ?CHIEF COMPLAINT / HPI:  ? ?Tooth pain: ?Had a filling placed years ago, that came out, her tooth broke off around [place]. She had no pain with that, but avoided eating on that side (left side). When it first came out, she was using oragel and peroxide to help, and it stopped hurting. Didn't often have pain. 3 days ago she woke up and her whole face was swollen. The pain is described as sharp and achey, causing her to have a headache. Pain is on the left side of face, tooth broke off on left lower jaw. Pain keeps her from eating. Has tried taking Tylenol extra strength, alieve, and ibuprofen. They have provided some pain relief, but not much. Describes pain as 10/10, keeping her from sleeping as well. No foul orders or taste in mouth. Pain is constant. No sick symptoms (f/n/v/d, no body aches). Last saw dentist around 5 years ago, hasn't been back because she lost her insurance. Hx of wisdom tooth removal.  ? ?  ? ?PERTINENT  PMH / PSH: Bipolar 1, insomnia, migraine, anxiety ? ? ?OBJECTIVE:  ?BP (!) 128/100   Pulse (!) 106   Ht 5\' 4"  (1.626 m)   Wt 182 lb 6.4 oz (82.7 kg)   SpO2 98%   BMI 31.31 kg/m?  ? ?General: pleasant, able to participate in exam, in notable pain ?Oral:  TTP with swelling in lower left jaw, no erythema, or lesions. Oropharynx pink, with tooth (molar/premolar) broken off in lower left jaw, with decay, surrounding gum pink/slightly pale, no bleeding, no foul smell.  ?Face:  TTP over left maxillary sinus and lateral surface of left frontal sinus ?Psych: Normal affect and mood ?Gluteal Wound:  Wound appeared to be well healing with stitches in place, no signs of infection or drainage, with some edema - CMA April Zimmerman for chaperone  ? ?ASSESSMENT/PLAN:  ?Tooth ache ?Patient tooth pain, exam, and history concerning for tooth infection. Patient likely has infection in tooth that may be spreading to sinuses, given history of headaches with TTP over sinuses. Patient needs  to see dentist to have tooth removed. Also recommending CT sinuses for concern of underlying sinusitis from extension of tooth infection. Patient wanting to get CT scan of sinuses but concerned it may not be covered by insurance. Encouraged patient to get imaging for risk of structures behind sinuses (brain, eye), and to call insurance about imaging coverage. I have a concern for sinusitis at this point, but given lack of sick symptoms (fever,chills,nausea), will not cover for treatment. Will treat with antibiotics and recommend close follow up. ?-Pen V K 500 mg QID, 10 days  ?-Roxicodone 5 mg q8hr ?-Ibuprofen 800 mg q8hr ?-CT sinuses, encouraged patient to call insurance ?-Return next week ?-See dentist: resources given in AVS, encouraged patient to call insurance ?  ?Orders Placed This Encounter  ?Procedures  ? CT Maxillofacial W/Cm  ?  Standing Status:   Future  ?  Standing Expiration Date:   06/01/2022  ?  Order Specific Question:   If indicated for the ordered procedure, I authorize the administration of contrast media per Radiology protocol  ?  Answer:   Yes  ?  Order Specific Question:   Is patient pregnant?  ?  Answer:   No  ?  Order Specific Question:   Preferred imaging location?  ?  Answer:   [facility]  ? ?Meds ordered this encounter  ?Medications  ? Penicillin V Potassium (PENICILLIN VK) 1000 UNIT/ML  SOLN  ?  Sig: Take 0.5 mLs (500 Units total) by mouth 4 (four) times daily for 10 days.  ?  Dispense:  20 mL  ?  Refill:  0  ? DISCONTD: oxyCODONE (ROXICODONE) 5 MG immediate release tablet  ?  Sig: Take 1 tablet (5 mg total) by mouth every 8 (eight) hours for 3 days.  ?  Dispense:  10 tablet  ?  Refill:  0  ? ibuprofen (ADVIL) 800 MG tablet  ?  Sig: Take 1 tablet (800 mg total) by mouth every 8 (eight) hours as needed for up to 7 days for mild pain or moderate pain.  ?  Dispense:  21 tablet  ?  Refill:  0  ? ?No follow-ups on file. ?@SIGNNOTE @ ? ?

## 2021-05-31 NOTE — Patient Instructions (Addendum)
It was great to see you! Thank you for allowing me to participate in your care! ? ?I recommend that you always bring your medications to each appointment as this makes it easy to ensure we are on the correct medications and helps [place] not miss when refills are needed. ? ?Our plans for today:  ?- Antibiotics for 10 days (Penicillin), four times a day ?- Oxycodone 5 mg for pain to take every 8 hours ?- 800 mg Ibuprofen for pain to take every 8 hours ?- Return in 1 week for follow up ?- CT scan of sinuses ?- List of Dentist ?- Call insurance and ask if CT Covered!! ?- Call insurance and ask for dentist!! ? ? ?Take care and seek immediate care sooner if you develop any concerns.  ? ?Dr. Bess Kinds, MD ?[facility] ? ?List of Dentist ? ?  ?[facility]?  ? ?[phone]?  ? ?[address], [place], [place] [address]  ? ??  ?[facility] - only for dentures or partials ?  ? ?[phone]?  ? ??  ?[facility]?  ? ?[phone]?  ? ? ?[facility] ?  ? ?[phone]?  ? ?[address] [place], [place] [address]   ?

## 2021-05-31 NOTE — Telephone Encounter (Signed)
Pt is requesting that the Oxycodone , Ibuprofen, and penicillin vk be sent to [facility] on [address]. The pharmacist said the prescription for penicillin was sent as a liquid form and they don't have the oxycodone.   Please call patient at [phone]   ?

## 2021-06-01 ENCOUNTER — Other Ambulatory Visit: Payer: Self-pay

## 2021-06-01 DIAGNOSIS — K0889 Other specified disorders of teeth and supporting structures: Secondary | ICD-10-CM | POA: Insufficient documentation

## 2021-06-01 MED ORDER — PENICILLIN V POTASSIUM 500 MG PO TABS: 500.0000 mg | ORAL_TABLET | Freq: Four times a day (QID) | ORAL | 0 refills | Status: AC

## 2021-06-01 MED ORDER — OXYCODONE HCL 5 MG PO TABS
5.0000 mg | ORAL_TABLET | Freq: Three times a day (TID) | ORAL | 0 refills | Status: DC | PRN
  Filled 2021-06-01: qty 10, 4d supply, fill #0

## 2021-06-01 MED ORDER — OXYCODONE HCL 5 MG PO TABS: 5.0000 mg | ORAL_TABLET | Freq: Three times a day (TID) | ORAL | 0 refills | Status: AC

## 2021-06-01 MED ORDER — IBUPROFEN 800 MG PO TABS: 800.0000 mg | ORAL_TABLET | Freq: Three times a day (TID) | ORAL | 0 refills | Status: AC | PRN

## 2021-06-01 NOTE — Addendum Note (Signed)
Addended by: Bess Kinds T on: 06/01/2021 01:49 PM ? ? Modules accepted: Orders ? ?

## 2021-06-01 NOTE — Assessment & Plan Note (Addendum)
Patient tooth pain, exam, and history concerning for tooth infection. Patient likely has infection in tooth that may be spreading to sinuses, given history of headaches with TTP over sinuses. Patient needs to see dentist to have tooth removed. Also recommending CT sinuses for concern of underlying sinusitis from extension of tooth infection. Patient wanting to get CT scan of sinuses but concerned it may not be covered by insurance. Encouraged patient to get imaging for risk of structures behind sinuses (brain, eye), and to call insurance about imaging coverage. I have a concern for sinusitis at this point, but given lack of sick symptoms (fever,chills,nausea), will not cover for treatment. Will treat with antibiotics and recommend close follow up. ?-Pen V K 500 mg QID, 10 days  ?-Roxicodone 5 mg q8hr ?-Ibuprofen 800 mg q8hr ?-CT sinuses, encouraged patient to call insurance ?-Return next week ?-See dentist: resources given in AVS, encouraged patient to call insurance ?

## 2021-06-01 NOTE — Telephone Encounter (Signed)
[facility] and patient call nurse line in regards to pain medication and penicillin.  ? ?Pharmacy reports they do not carry narcotics and prescription will need to go to a different pharmacy. Patient reports we can send to [facility] on [address].  ? ?Patient prefers tablet form of penicillin vs solution that was called in. Patient asking for this to be resent in tab form to [facility] as well.  ? ?Will forward to ordering provider.  ? ?Prescriptions have been cancelled at community pharmacy.  ?

## 2021-06-01 NOTE — Telephone Encounter (Signed)
Opened in error.Kristin [name], CMA  

## 2021-06-03 ENCOUNTER — Telehealth: Payer: Self-pay

## 2021-06-03 NOTE — Addendum Note (Signed)
Addended by: Gilberto Better R on: 06/03/2021 03:04 PM ? ? Modules accepted: Orders ? ?

## 2021-06-03 NOTE — Telephone Encounter (Signed)
Called patient to inform her of upcoming imaging appointment. ? ?Patient was not in a position to write information down.  She will call back because she does not have MYCHART. ? ?CT Maxillofacial w/Cm ? ?06/09/2021 (Thursday) ?[facility] ?1500 with arrival at 1445 ?Patient is to be NPO after 11pm the night before the procedure. ? ?When patient calls back please inform her of the above. ? ?.Ozella Almond, CMA ? ?

## 2021-06-05 NOTE — Progress Notes (Signed)
No show

## 2021-06-06 ENCOUNTER — Encounter: Payer: Self-pay | Admitting: Family Medicine

## 2021-06-06 ENCOUNTER — Other Ambulatory Visit: Payer: Self-pay

## 2021-06-06 ENCOUNTER — Ambulatory Visit (INDEPENDENT_AMBULATORY_CARE_PROVIDER_SITE_OTHER): Payer: 59 | Admitting: Family Medicine

## 2021-06-06 VITALS — BP 127/91 | HR 99 | Wt 185.2 lb

## 2021-06-06 DIAGNOSIS — Z4802 Encounter for removal of sutures: Secondary | ICD-10-CM

## 2021-06-06 NOTE — Patient Instructions (Addendum)
Suture Removal, Care After °The following information offers guidance on how to care for yourself after your procedure. Your health care provider may also give you more specific instructions. If you have problems or questions, contact your health care provider. °What can I expect after the procedure? °After your stitches (sutures) are removed, it is common to have: °Some discomfort and swelling in the area. °Slight redness in the area. °Follow these instructions at home: °If you have a dressing: °Wash your hands with soap and water for at least 20 seconds before and after you change your bandage (dressing). If soap and water are not available, use hand sanitizer. °Change your dressing as told by your health care provider. If your dressing becomes wet or dirty, or develops a bad smell, change it as soon as possible. °If your dressing sticks to your skin, pour warm, clean water over it until it loosens and can be removed without pulling apart the wound edges. Pat the area dry with a soft, clean towel. Do not rub the wound because that may cause bleeding. °Wound care ° °Check your wound every day for signs of infection. Check for: °More redness, swelling, or pain. °Fluid or blood. °New warmth, a rash, or hardness at the wound site. °Pus or a bad smell. °Wash your hands with soap and water for at least 20 seconds before and after touching your wound. If soap and water are not available, use hand sanitizer. °Keep the wound area dry and clean. Clean and pat the wound dry as told by your health care provider. °Apply cream or ointment only as told by your health care provider. °If skin glue or adhesive strips were applied after sutures were removed, leave these closures in place. They may need to stay in place for 2 weeks or longer. If adhesive strip edges start to loosen and curl up, you may trim the loose edges. Do not remove adhesive strips completely unless your health care provider tells you to do that. °Continue to  protect the wound from injury. °Do not pick at your wound. Picking can cause an infection. °Bathing °Do not take baths, swim, or use a hot tub until your health care provider approves. Ask your health care provider if you may take showers. °Follow these steps for showering: °If you have a dressing, remove it before getting into the shower. °In the shower, allow soapy water to get on the wound. Avoid scrubbing the wound. °When you get out of the shower, dry the wound by patting it with a clean towel. °Reapply a dressing over the wound, if needed. °Scar care °When your wound has completely healed, help decrease the size of your scar by: °Wearing sunscreen over the scar or covering it with clothing when you are outside. New scars get sunburned easily, which can make scarring worse. °Gently massaging the scarred area. This can decrease scar thickness. °General instructions °Take over-the-counter and prescription medicines only as told by your health care provider. °Keep all follow-up visits. This is important. °Contact a health care provider if: °You have more redness, swelling, or pain around your wound. °You have fluid or blood coming from your wound. °You have new warmth, a rash, or hardness at the wound site. °You have pus or a bad smell coming from your wound. °Your wound opens up. °Get help right away if: °You have a fever or chills. °You have red streaks coming from your wound. °Summary °After your sutures are removed, it is common to have some discomfort   and swelling in the area. °Wash your hands with soap and water before you change your bandage (dressing). °Keep the wound area dry and clean. Do not take baths, swim, or use a hot tub until your health care provider approves. °This information is not intended to replace advice given to you by your health care provider. Make sure you discuss any questions you have with your health care provider. °Document Revised: 06/22/2020 Document Reviewed:  06/22/2020 °Elsevier Patient Education © 2022 [employer] ° °

## 2021-06-06 NOTE — Progress Notes (Signed)
? ? ?  SUBJECTIVE:  ? ?CHIEF COMPLAINT / HPI:  ? ?Needs stitches removed ?States she dropped a glass ashtray 2 weeks ago and when she slipped while cleaning it up which made a cut on her left buttock.  She presented to the ED the same day to have the stitches placed.  She endorses some bleeding and pain from the site.  Otherwise she is doing well and does not have any concerns at this time. ? ?PERTINENT  PMH / PSH: As above. ? ?OBJECTIVE:  ? ?BP (!) 127/91   Pulse 99   Wt 185 lb 3.2 oz (84 kg)   SpO2 100%   BMI 31.79 kg/m?   ?General: Appears well, no acute distress. Age appropriate. ?Cardiac: RRR, normal heart sounds, no murmurs ?Respiratory: CTAB, normal effort ?Skin: Left buttock with 2 cm healing laceration with good approximation with suture.  No surrounding erythema.  Minimal bleeding of superficial skin wound. ?Neuro: alert and oriented ?Psych: normal affect ? ? ?ASSESSMENT/PLAN:  ? ?Encounter for removal of sutures ?ED visit 3/12 laceration to left buttock due to falling on glass.  5 interrupted sutures placed.  Instructed to follow-up with PCP for suture removal 2 weeks after. Wound nicely approximated. ? ?SUTURE REMOVAL ?Performed by: Gerlene Fee, DO ? ?Consent: Verbal consent obtained. ?Consent given by: patient ?Required items: required suture removal kit ?Time out: Immediately prior to procedure the correct patient, procedure, equipment, support staff and site/side marked as required. ? ?Location: Left buttock, lateral to gluteal fold ?Wound Appearance: clean, minimal superficial bleeding ?Sutures/Staples Removed: 5 ? ?Patient tolerance: Patient tolerated the procedure well with no immediate complications. ?-Discussed aftercare allowing care time for scabbing ?- AVS aftercare instructions given ? ?Ryheem Jay Autry-Lott, DO ?[place]  ?

## 2021-06-08 ENCOUNTER — Other Ambulatory Visit: Payer: Self-pay

## 2021-06-09 ENCOUNTER — Ambulatory Visit (HOSPITAL_COMMUNITY): Payer: 59 | Attending: Family Medicine

## 2021-07-12 ENCOUNTER — Encounter: Payer: Self-pay | Admitting: Family Medicine

## 2021-07-12 ENCOUNTER — Ambulatory Visit (INDEPENDENT_AMBULATORY_CARE_PROVIDER_SITE_OTHER): Payer: 59 | Admitting: Family Medicine

## 2021-07-12 ENCOUNTER — Other Ambulatory Visit: Payer: Self-pay

## 2021-07-12 VITALS — BP 183/126 | HR 110 | Wt 180.2 lb

## 2021-07-12 DIAGNOSIS — M25561 Pain in right knee: Secondary | ICD-10-CM

## 2021-07-12 DIAGNOSIS — M48062 Spinal stenosis, lumbar region with neurogenic claudication: Secondary | ICD-10-CM | POA: Diagnosis not present

## 2021-07-12 DIAGNOSIS — I10 Essential (primary) hypertension: Secondary | ICD-10-CM | POA: Diagnosis not present

## 2021-07-12 DIAGNOSIS — R8290 Unspecified abnormal findings in urine: Secondary | ICD-10-CM

## 2021-07-12 DIAGNOSIS — G8929 Other chronic pain: Secondary | ICD-10-CM

## 2021-07-12 DIAGNOSIS — M25562 Pain in left knee: Secondary | ICD-10-CM

## 2021-07-12 LAB — POCT UA - MICROSCOPIC ONLY: RBC, Urine, Miroscopic: NONE SEEN (ref 0–2)

## 2021-07-12 LAB — POCT URINALYSIS DIP (MANUAL ENTRY)
Blood, UA: NEGATIVE
Glucose, UA: NEGATIVE mg/dL
Ketones, POC UA: NEGATIVE mg/dL
Nitrite, UA: POSITIVE — AB
Protein Ur, POC: 30 mg/dL — AB
Spec Grav, UA: 1.02 (ref 1.010–1.025)
Urobilinogen, UA: 1 E.U./dL
pH, UA: 5.5 (ref 5.0–8.0)

## 2021-07-12 MED ORDER — LISINOPRIL-HYDROCHLOROTHIAZIDE 20-25 MG PO TABS
1.0000 | ORAL_TABLET | Freq: Every day | ORAL | 0 refills | Status: DC
  Filled 2021-07-12: qty 90, 90d supply, fill #0

## 2021-07-12 NOTE — Progress Notes (Signed)
? ? ?SUBJECTIVE:  ? ?CHIEF COMPLAINT / HPI:  ? ?Referral for pain management ?Patient states that she has chronic arthritis and a slipped disc in her back.  She states that she takes Tylenol, Advil, Voltaren gel, gabapentin, and has had knee injections within the last year without improvement.  Denies fever, saddle anesthesia, issues with urination.  But does state that she has noticed an abnormal odor to her urine and believes she has another UTI. ? ?Hypertension: ?- Medications: Zestoretic 10-12.5 mg daily ?- Compliance: Yes ?- Checking BP at home: No ?- Denies any SOB, CP, vision changes, LE edema, medication SEs, or symptoms of hypotension ? ?PERTINENT  PMH / PSH: Migraine headache, abnormal urine odor, history of recurrent UTIs ? ?OBJECTIVE:  ? ?BP (!) 183/126   Pulse (!) 110   Wt 180 lb 3.2 oz (81.7 kg)   SpO2 98%   BMI 30.93 kg/m?   ?Physical Exam ?Vitals reviewed.  ?Constitutional:   ?   General: She is not in acute distress. ?   Appearance: She is not ill-appearing, toxic-appearing or diaphoretic.  ?Cardiovascular:  ?   Rate and Rhythm: Normal rate and regular rhythm.  ?   Heart sounds: Normal heart sounds.  ?Pulmonary:  ?   Effort: Pulmonary effort is normal.  ?   Breath sounds: Normal breath sounds.  ?Abdominal:  ?   Tenderness: There is no abdominal tenderness. There is no right CVA tenderness or left CVA tenderness.  ?Musculoskeletal:  ?   Right lower leg: No edema.  ?   Left lower leg: No edema.  ?Neurological:  ?   Mental Status: She is alert and oriented to person, place, and time.  ?   Gait: Gait abnormal.  ?   Comments: Walks with a cane  ?Psychiatric:     ?   Mood and Affect: Mood normal.     ?   Behavior: Behavior normal.  ? ?EXAM: ?MRI LUMBAR SPINE WITHOUT CONTRAST ?  ?TECHNIQUE: ?Multiplanar, multisequence MR imaging of the lumbar spine was ?performed. No intravenous contrast was administered. ?  ?COMPARISON:  04/14/2019 and prior. ?  ?FINDINGS: ?Segmentation:  Standard. ?  ?Alignment:   Straightening of lordosis. ?  ?Vertebrae: Vertebral body heights are preserved. Small L5 ?hemangioma. ?  ?Conus medullaris and cauda equina: Conus extends to the L1 level. ?Conus and cauda equina appear normal. ?  ?Disc levels: Mild desiccation at the L4-5, L5-S1 level. ?  ?L1-2: No significant disc bulge. Patent spinal canal and neural ?foramen. ?  ?L2-3: No significant disc bulge. Patent spinal canal and neural ?foramen. ?  ?L3-4: Minimal disc bulge and facet degenerative spurring. Patent ?spinal canal and neural foramen. ?  ?L4-5: Disc bulge with superimposed left subarticular/foraminal ?protrusion abutting the descending L5 nerve root. Facet degenerative ?spurring. Patent spinal canal. Mild right and moderate left neural ?foraminal narrowing, grossly unchanged. ?  ?L5-S1: Minimal right predominant disc bulge and facet degenerative ?spurring. Patent spinal canal and neural foramen. ?  ?Paraspinal and other soft tissues: Negative. ?  ?IMPRESSION: ?Left L4-5 subarticular/foraminal protrusion abutting the descending ?left L5 nerve root with moderate left neural foraminal narrowing, ?grossly unchanged. ?  ?Mild right L4-5 neural foraminal narrowing. ?  ?  ?Electronically Signed ?  By: Primitivo Gauze M.D. ?  On: 05/09/2020 20:43 ?ASSESSMENT/PLAN:  ? ?1. Spinal stenosis of lumbar region with neurogenic claudication ?Reviewed MRI from 1 year ago patient with foraminal narrowing. Walks with a cane.  Possible surgical candidate for symptomatic relief.  Will refer to neurosurgery for consultation prior to pain clinic referral.  Pain clinic referral may not be necessary. ?- Ambulatory referral to Neurosurgery ? ?2. Chronic pain of both knees ?Reviewed 2021 x-ray was remarkable for possible loose body or osteophyte at the time and patellofemoral compartment OA.  Will obtain updated x-ray with different views to assess for worsening patellofemoral syndrome or osteonecrosis. Discussed referral to [facility] with Dr. Nori Riis.  ?- DG  Knee 4 Views W/Patella Left; Future ?- DG Knee 4 Views W/Patella Right; Future ?- DG Knee Bilateral Standing AP; Future ?- Ambulatory referral to Sports Medicine ? ?3. Essential hypertension ?Significant elevated today.  Asymptomatic.  Increase Zestoretic to 20-25 mg daily.  Will obtain BMP at this time and needs follow-up within the next week for blood pressure recheck.  ED precautions given ?- Basic Metabolic Panel ? ?4. Abnormal urine odor ?- POCT UA - Microscopic Only ?- POCT urinalysis dipstick ? ?Monice Lundy Autry-Lott, DO ?[place]  ?

## 2021-07-12 NOTE — Patient Instructions (Addendum)
For the spinal pain I have referred you to neurosurgery.  They will call you to get this scheduled. ?For your increased blood pressure I have doubled your medication.  So you will be taking Zestoretic 20-25 mg.  For the pills that you already have you can take 2 daily until gone.  Your new prescription is at the pharmacy.  Follow-up in 1 to 2 weeks for blood pressure recheck. ?I will follow-up with you when your urinalysis is available ?For the knee issue I am getting knee x-rays and referring you to the sports medicine center.  You can call [phone] to schedule your visit. ?

## 2021-07-13 LAB — BASIC METABOLIC PANEL
BUN/Creatinine Ratio: 20 (ref 9–23)
BUN: 14 mg/dL (ref 6–24)
CO2: 24 mmol/L (ref 20–29)
Calcium: 9.4 mg/dL (ref 8.7–10.2)
Chloride: 95 mmol/L — ABNORMAL LOW (ref 96–106)
Creatinine, Ser: 0.7 mg/dL (ref 0.57–1.00)
Glucose: 123 mg/dL — ABNORMAL HIGH (ref 70–99)
Potassium: 4.1 mmol/L (ref 3.5–5.2)
Sodium: 135 mmol/L (ref 134–144)
eGFR: 107 mL/min/{1.73_m2} (ref >59)

## 2021-07-15 ENCOUNTER — Telehealth: Payer: Self-pay | Admitting: Family Medicine

## 2021-07-15 ENCOUNTER — Ambulatory Visit: Payer: 59 | Admitting: Family Medicine

## 2021-07-15 MED ORDER — FLUCONAZOLE 150 MG PO TABS: 150.0000 mg | ORAL_TABLET | Freq: Once | ORAL | 0 refills | Status: AC

## 2021-07-15 MED ORDER — CEPHALEXIN 500 MG PO CAPS: 500.0000 mg | ORAL_CAPSULE | Freq: Two times a day (BID) | ORAL | 0 refills | Status: AC

## 2021-07-15 NOTE — Telephone Encounter (Signed)
Called patient to discuss UA results which is remarkable for UTI.  Patient states she continues to have recurrent UTIs.  She does have a history of holding her urine.  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  Keflex has worked in the past but symptoms seem to return after finishing the treatment.  I have encouraged her to follow-up with her primary care doctor in about 4 weeks if recurrent symptoms.  I will also send in treatment for yeast infection as patient states that she usually gets these after finishing antibiotic treatment.  ? ?Lavonda Jumbo, DO ?07/15/2021, 11:25 AM ?PGY-3, [facility] ? ?

## 2021-07-18 ENCOUNTER — Ambulatory Visit (INDEPENDENT_AMBULATORY_CARE_PROVIDER_SITE_OTHER): Payer: 59 | Admitting: Family Medicine

## 2021-07-18 VITALS — BP 121/86 | Ht 64.5 in | Wt 180.0 lb

## 2021-07-18 DIAGNOSIS — M170 Bilateral primary osteoarthritis of knee: Secondary | ICD-10-CM

## 2021-07-18 MED ORDER — METHYLPREDNISOLONE ACETATE 40 MG/ML IJ SUSP
40.0000 mg | Freq: Once | INTRAMUSCULAR | Status: AC
Start: 2021-07-18 — End: 2021-07-18
  Administered 2021-07-18: 40 mg via INTRA_ARTICULAR

## 2021-07-18 NOTE — Patient Instructions (Signed)
Your pain is due to arthritis. ?These are the different medications you can take for this: ?Tylenol 500mg  1-2 tabs three times a day for pain. ?Capsaicin, aspercreme, or biofreeze topically up to four times a day may also help with pain. ?Some supplements that may help for arthritis: Boswellia extract, curcumin, pycnogenol ?Aleve 1-2 tabs twice a day with food OR meloxicam 15mg  daily with food for pain and inflammation ?Cortisone injections are an option - you were given these today. ?If cortisone injections do not help, there are different types of shots that may help but they take longer to take effect. ?It's important that you continue to stay active. ?Straight leg raises, knee extensions 3 sets of 10 once a day (add ankle weight if these become too easy). ?Consider physical therapy to strengthen muscles around the joint that hurts to take pressure off of the joint itself. ?Shoe inserts with good arch support may be helpful. ?Heat or ice 15 minutes at a time 3-4 times a day as needed to help with pain. ?Water aerobics and cycling with low resistance are the best two types of exercise for arthritis though any exercise is ok as long as it doesn't worsen the pain. ?Follow up with me as needed. ? ?

## 2021-07-19 ENCOUNTER — Encounter: Payer: Self-pay | Admitting: Family Medicine

## 2021-07-19 NOTE — Progress Notes (Signed)
PCP: Gifford Shave, MD ? ?Subjective:  ? ?HPI: ?Patient is a 46 y.o. female here for bilateral knee pain. ? ?Patient reports she's had pain in her knees for several years. ?Told in past she has 'bone on bone' arthritis in both knees. ?Pain has worsened to the point that it's difficult for her to walk and she's now using a cane to help with ambulation. ?She's had steroid injections in the past with good relief and is interested in these again. ?No new injuries. ?New radiographs were ordered by family practice center but she's not had these done yet. ? ?Past Medical History:  ?Diagnosis Date  ? Acid reflux   ? ACL tear 2011  ? not sure which side  ? Acute lateral meniscus tear of right knee   ? Anxiety   ? Arthritis   ? Back pain   ? slipped disc lower back lumbar  ? Bipolar 1 disorder ([place])   ? Depression   ? Dysfunctional uterine bleeding   ? Eczema   ? Hypertension   ? Insomnia   ? Migraine headache   ? Neuromuscular disorder ([place])   ? leg cramps  ? ? ?Current Outpatient Medications on File Prior to Visit  ?Medication Sig Dispense Refill  ? acetaminophen (TYLENOL) 500 MG tablet Take 1 tablet (500 mg total) by mouth every 8 (eight) hours as needed for mild pain. 30 tablet 1  ? cephALEXin (KEFLEX) 500 MG capsule Take 1 capsule (500 mg total) by mouth 2 (two) times daily for 10 days. 20 capsule 0  ? cyclobenzaprine (FLEXERIL) 10 MG tablet Take 1 tablet (10 mg total) by mouth 3 (three) times daily as needed for muscle spasms. 30 tablet 0  ? diphenhydrAMINE (BENADRYL) 25 MG tablet Take 25 mg by mouth every 6 (six) hours as needed for allergies, sleep or itching.    ? ferrous sulfate 325 (65 FE) MG tablet Take 1 tablet (325 mg total) by mouth daily. (Patient not taking: Reported on 12/01/2020) 30 tablet 0  ? gabapentin (NEURONTIN) 300 MG capsule Take 1 capsule (300 mg total) by mouth 3 (three) times daily. 90 capsule 1  ? lisinopril-hydrochlorothiazide (ZESTORETIC) 20-25 MG tablet Take 1 tablet by mouth daily. 90  tablet 0  ? meloxicam (MOBIC) 15 MG tablet Take 1 tablet (15 mg total) by mouth daily. (Patient not taking: Reported on 01/07/2021) 30 tablet 1  ? OLANZapine (ZYPREXA) 5 MG tablet Take 1 tablet (5 mg total) by mouth at bedtime. 30 tablet 1  ? ondansetron (ZOFRAN) 4 MG tablet Take 1 tablet (4 mg total) by mouth every 8 (eight) hours as needed for nausea or vomiting. (Patient not taking: Reported on 01/07/2021) 20 tablet 0  ? pantoprazole (PROTONIX) 40 MG tablet Take 1 tablet (40 mg total) by mouth daily. 30 tablet 1  ? thiamine 100 MG tablet Take 1 tablet (100 mg total) by mouth daily. (Patient not taking: Reported on 01/07/2021) 30 tablet 0  ? traZODone (DESYREL) 50 MG tablet Take 1 tablet (50 mg total) by mouth at bedtime as needed for sleep. 30 tablet 1  ? triamcinolone ointment (KENALOG) 0.5 % Apply cream to affected area(s) two times daily. 30 g 0  ? Vitamin D, Ergocalciferol, (DRISDOL) 1.25 MG (50000 UNIT) CAPS capsule Take 1 capsule (50,000 Units total) by mouth every 7 (seven) days. 4 capsule 2  ? [DISCONTINUED] fluticasone (FLONASE) 50 MCG/ACT nasal spray Place 2 sprays into both nostrils daily. (Patient taking differently: Place 2 sprays into both nostrils  daily as needed for allergies. ) 1 g 0  ? [DISCONTINUED] promethazine (PHENERGAN) 25 MG tablet Take 1 tablet (25 mg total) by mouth every 6 (six) hours as needed for nausea or vomiting. 10 tablet 0  ? ?No current facility-administered medications on file prior to visit.  ? ? ?Past Surgical History:  ?Procedure Laterality Date  ? BIOPSY  09/19/2017  ? Procedure: BIOPSY;  Surgeon: Otis Brace, MD;  Location: [facility] ENDOSCOPY;  Service: Gastroenterology;;  ? BIOPSY  10/15/2019  ? Procedure: BIOPSY;  Surgeon: Wilford Corner, MD;  Location: [facility] ENDOSCOPY;  Service: Endoscopy;;  ? COLONOSCOPY N/A 10/15/2019  ? Procedure: COLONOSCOPY;  Surgeon: Wilford Corner, MD;  Location: [facility] ENDOSCOPY;  Service: Endoscopy;  Laterality: N/A;  ? DILITATION &  CURRETTAGE/HYSTROSCOPY WITH HYDROTHERMAL ABLATION N/A 04/04/2017  ? Procedure: DILATATION & CURETTAGE/HYSTEROSCOPY WITH HYDROTHERMAL ABLATION;  Surgeon: Donnamae Jude, MD;  Location: [place];  Service: Gynecology;  Laterality: N/A;  ? ESOPHAGOGASTRODUODENOSCOPY N/A 10/15/2019  ? Procedure: ESOPHAGOGASTRODUODENOSCOPY (EGD);  Surgeon: Wilford Corner, MD;  Location: [facility] ENDOSCOPY;  Service: Endoscopy;  Laterality: N/A;  ? ESOPHAGOGASTRODUODENOSCOPY (EGD) WITH PROPOFOL N/A 09/19/2017  ? Procedure: ESOPHAGOGASTRODUODENOSCOPY (EGD) WITH PROPOFOL;  Surgeon: Otis Brace, MD;  Location: [facility] ENDOSCOPY;  Service: Gastroenterology;  Laterality: N/A;  ? KNEE ARTHROSCOPY WITH LATERAL MENISECTOMY Right 09/30/2019  ? Procedure: KNEE ARTHROSCOPY WITH LATERAL MENISECTOMY;  Surgeon: Renette Butters, MD;  Location: [facility];  Service: Orthopedics;  Laterality: Right;  ? TUBAL LIGATION  yrs ago  ? ? ?Allergies  ?Allergen Reactions  ? Sumatriptan Anaphylaxis  ?  High blood pressure and numb  ? Other   ?  Bananas-stomach cramps  ? Eggs Or Egg-Derived Products Rash and Other (See Comments)  ?  Stomach cramps  ? ? ?BP 121/86   Ht 5' 4.5" (1.638 m)   Wt 180 lb (81.6 kg)   BMI 30.42 kg/m?  ? ?   ? View : No data to display.  ?  ?  ?  ? ? ?   ? View : No data to display.  ?  ?  ?  ? ? ?    ?Objective:  ?Physical Exam: ? ?Gen: NAD, comfortable in exam room ? ?Right knee: ?No gross deformity, ecchymoses, swelling. ?TTP medial joint line, post patellar facets.  No other tenderness. ?FROM with normal strength. ?Negative ant/post drawers. Negative valgus/varus testing. Negative lachman.  ?Negative mcmurrays, apleys. ?NV intact distally. ? ?Left knee: ?No gross deformity, ecchymoses, swelling. ?TTP medial joint line, post patellar facets.  No other tenderness. ?FROM with normal strength. ?Negative ant/post drawers. Negative valgus/varus testing. Negative lachman.  ?Negative mcmurrays, apleys. ?NV intact  distally.  ?Assessment & Plan:  ?1. Bilateral knee pain - 2/2 known osteoarthritis.  Exam otherwise reassuring.  Has new radiographs ordered - encouraged to get there to assess severity.  Injections given today.  Discussed tylenol, topical medications, nsaids, supplements.  Quad strengthening. Ice or heat.  F/u prn. ? ?After informed written consent timeout was performed, patient was seated on exam table. Right knee was prepped with alcohol swab and utilizing anteromedial approach, patient's right knee was injected intraarticularly with 3:1 lidocaine: depomedrol. Patient tolerated the procedure well without immediate complications. ? ?After informed written consent timeout was performed, patient was seated on exam table. Left knee was prepped with alcohol swab and utilizing anteromedial approach, patient's left knee was injected intraarticularly with 3:1 lidocaine: depomedrol. Patient tolerated the procedure well without immediate complications. ?

## 2021-07-27 ENCOUNTER — Telehealth: Payer: Self-pay | Admitting: *Deleted

## 2021-07-27 NOTE — Telephone Encounter (Signed)
Patient is calling to see if she can be referred to pain management for her back.  She is waiting to hear from neurosurgery but unsure if she will be able to get to [place] for appointments.  Will forward to MD.  Burnard Hawthorne ? ?

## 2021-08-03 ENCOUNTER — Encounter: Payer: 59 | Admitting: Physical Medicine and Rehabilitation

## 2021-08-19 ENCOUNTER — Encounter (HOSPITAL_COMMUNITY): Payer: Self-pay | Admitting: Emergency Medicine

## 2021-08-19 ENCOUNTER — Emergency Department (HOSPITAL_COMMUNITY)
Admission: EM | Admit: 2021-08-19 | Discharge: 2021-08-20 | Disposition: A | Discharge status: Home / Self Care | Payer: 59 | Attending: Emergency Medicine | Admitting: Emergency Medicine

## 2021-08-19 ENCOUNTER — Emergency Department (HOSPITAL_COMMUNITY): Payer: 59

## 2021-08-19 ENCOUNTER — Other Ambulatory Visit: Payer: Self-pay

## 2021-08-19 DIAGNOSIS — Z5321 Procedure and treatment not carried out due to patient leaving prior to being seen by health care provider: Secondary | ICD-10-CM | POA: Insufficient documentation

## 2021-08-19 DIAGNOSIS — R569 Unspecified convulsions: Secondary | ICD-10-CM | POA: Insufficient documentation

## 2021-08-19 NOTE — ED Provider Triage Note (Signed)
Emergency Medicine Provider Triage Evaluation Note  Kristin [name] , a 46 y.o. female  was evaluated in triage.  Pt complains of possible seizure.  States last thing she remembers was standing in the kitchen and feeling "faint", next  thing she knew she woke up on the floor.  She did not have any dental or tongue injury, no bowel or bladder incontinence.  States she has never had issues like this before.  Boyfriend witnessed incident and was concern for possible seizure.  She has no history of seizures.  Review of Systems  Positive: Possible seizure Negative: fever  Physical Exam  BP (!) 140/95 (BP Location: Left Arm)   Pulse 92   Temp 98 F (36.7 C) (Oral)   Resp 16   SpO2 98%  Gen:   Awake, no distress   Resp:  Normal effort  MSK:   Moves extremities without difficulty  Other:  AAOx3, no tongue or dental injury noted  Medical Decision Making  Medically screening exam initiated at 11:24 PM.  Appropriate orders placed.  Kristin [name] was informed that the remainder of the evaluation will be completed by another provider, this initial triage assessment does not replace that evaluation, and the importance of remaining in the ED until their evaluation is complete.  Possible seizure reported however, after talking with her it sounds more like syncopal event.  Will check EKG, labs, CXR.   Garlon Hatchet, PA-C 08/19/21 [id number]

## 2021-08-19 NOTE — ED Triage Notes (Signed)
Pt reported to ED with c/o seizure like activity today. Pt states she does not have a hx of seizures, she states that she does not recollect event and it was witnessed by boyfriend.

## 2021-08-20 LAB — CBC WITH DIFFERENTIAL/PLATELET
Abs Immature Granulocytes: 0 10*3/uL (ref 0.00–0.07)
Basophils Absolute: 0.1 10*3/uL (ref 0.0–0.1)
Basophils Relative: 2 %
Eosinophils Absolute: 0.2 10*3/uL (ref 0.0–0.5)
Eosinophils Relative: 5 %
HCT: 35.1 % — ABNORMAL LOW (ref 36.0–46.0)
Hemoglobin: 11.8 g/dL — ABNORMAL LOW (ref 12.0–15.0)
Immature Granulocytes: 0 %
Lymphocytes Relative: 50 %
Lymphs Abs: 1.8 10*3/uL (ref 0.7–4.0)
MCH: 29.1 pg (ref 26.0–34.0)
MCHC: 33.6 g/dL (ref 30.0–36.0)
MCV: 86.7 fL (ref 80.0–100.0)
Monocytes Absolute: 0.5 10*3/uL (ref 0.1–1.0)
Monocytes Relative: 14 %
Neutro Abs: 1.1 10*3/uL — ABNORMAL LOW (ref 1.7–7.7)
Neutrophils Relative %: 29 %
Platelets: 208 10*3/uL (ref 150–400)
RBC: 4.05 MIL/uL (ref 3.87–5.11)
RDW: 16.1 % — ABNORMAL HIGH (ref 11.5–15.5)
WBC: 3.8 10*3/uL — ABNORMAL LOW (ref 4.0–10.5)
nRBC: 0 % (ref 0.0–0.2)

## 2021-08-20 LAB — I-STAT BETA HCG BLOOD, ED (MC, WL, AP ONLY): I-stat hCG, quantitative: <5 m[IU]/mL (ref <5)

## 2021-08-20 LAB — BASIC METABOLIC PANEL
Anion gap: 10 (ref 5–15)
BUN: 24 mg/dL — ABNORMAL HIGH (ref 6–20)
CO2: 26 mmol/L (ref 22–32)
Calcium: 9.1 mg/dL (ref 8.9–10.3)
Chloride: 100 mmol/L (ref 98–111)
Creatinine, Ser: 0.98 mg/dL (ref 0.44–1.00)
GFR, Estimated: >60 mL/min (ref >60)
Glucose, Bld: 100 mg/dL — ABNORMAL HIGH (ref 70–99)
Potassium: 4.3 mmol/L (ref 3.5–5.1)
Sodium: 136 mmol/L (ref 135–145)

## 2021-08-20 NOTE — ED Notes (Signed)
Patient states the wait is too long and she is leaving 

## 2021-08-21 ENCOUNTER — Emergency Department (HOSPITAL_COMMUNITY)
Admission: EM | Admit: 2021-08-21 | Discharge: 2021-08-22 | Discharge status: Against Medical Advice | Payer: 59 | Attending: Physician Assistant | Admitting: Physician Assistant

## 2021-08-21 ENCOUNTER — Other Ambulatory Visit: Payer: Self-pay

## 2021-08-21 ENCOUNTER — Encounter (HOSPITAL_COMMUNITY): Payer: Self-pay | Admitting: Emergency Medicine

## 2021-08-21 DIAGNOSIS — R410 Disorientation, unspecified: Secondary | ICD-10-CM | POA: Diagnosis present

## 2021-08-21 DIAGNOSIS — M79662 Pain in left lower leg: Secondary | ICD-10-CM | POA: Insufficient documentation

## 2021-08-21 DIAGNOSIS — N179 Acute kidney failure, unspecified: Secondary | ICD-10-CM | POA: Diagnosis not present

## 2021-08-21 DIAGNOSIS — N390 Urinary tract infection, site not specified: Secondary | ICD-10-CM | POA: Diagnosis not present

## 2021-08-21 DIAGNOSIS — R0602 Shortness of breath: Secondary | ICD-10-CM | POA: Insufficient documentation

## 2021-08-21 DIAGNOSIS — I10 Essential (primary) hypertension: Secondary | ICD-10-CM | POA: Insufficient documentation

## 2021-08-21 DIAGNOSIS — M79661 Pain in right lower leg: Secondary | ICD-10-CM | POA: Insufficient documentation

## 2021-08-21 DIAGNOSIS — R112 Nausea with vomiting, unspecified: Secondary | ICD-10-CM | POA: Diagnosis not present

## 2021-08-21 DIAGNOSIS — Z5321 Procedure and treatment not carried out due to patient leaving prior to being seen by health care provider: Secondary | ICD-10-CM | POA: Diagnosis not present

## 2021-08-21 DIAGNOSIS — R42 Dizziness and giddiness: Secondary | ICD-10-CM | POA: Diagnosis not present

## 2021-08-21 DIAGNOSIS — Z79899 Other long term (current) drug therapy: Secondary | ICD-10-CM | POA: Insufficient documentation

## 2021-08-21 LAB — COMPREHENSIVE METABOLIC PANEL
ALT: 13 U/L (ref 0–44)
AST: 56 U/L — ABNORMAL HIGH (ref 15–41)
Albumin: 3.8 g/dL (ref 3.5–5.0)
Alkaline Phosphatase: 89 U/L (ref 38–126)
Anion gap: 18 — ABNORMAL HIGH (ref 5–15)
BUN: 29 mg/dL — ABNORMAL HIGH (ref 6–20)
CO2: 18 mmol/L — ABNORMAL LOW (ref 22–32)
Calcium: 8.7 mg/dL — ABNORMAL LOW (ref 8.9–10.3)
Chloride: 98 mmol/L (ref 98–111)
Creatinine, Ser: 1.72 mg/dL — ABNORMAL HIGH (ref 0.44–1.00)
GFR, Estimated: 36 mL/min — ABNORMAL LOW (ref >60)
Glucose, Bld: 82 mg/dL (ref 70–99)
Potassium: 4.4 mmol/L (ref 3.5–5.1)
Sodium: 134 mmol/L — ABNORMAL LOW (ref 135–145)
Total Bilirubin: 0.8 mg/dL (ref 0.3–1.2)
Total Protein: 6.5 g/dL (ref 6.5–8.1)

## 2021-08-21 LAB — CBC WITH DIFFERENTIAL/PLATELET
Abs Immature Granulocytes: 0.01 10*3/uL (ref 0.00–0.07)
Basophils Absolute: 0.1 10*3/uL (ref 0.0–0.1)
Basophils Relative: 2 %
Eosinophils Absolute: 0.4 10*3/uL (ref 0.0–0.5)
Eosinophils Relative: 9 %
HCT: 34.7 % — ABNORMAL LOW (ref 36.0–46.0)
Hemoglobin: 11.4 g/dL — ABNORMAL LOW (ref 12.0–15.0)
Immature Granulocytes: 0 %
Lymphocytes Relative: 43 %
Lymphs Abs: 1.9 10*3/uL (ref 0.7–4.0)
MCH: 28.7 pg (ref 26.0–34.0)
MCHC: 32.9 g/dL (ref 30.0–36.0)
MCV: 87.4 fL (ref 80.0–100.0)
Monocytes Absolute: 0.6 10*3/uL (ref 0.1–1.0)
Monocytes Relative: 14 %
Neutro Abs: 1.3 10*3/uL — ABNORMAL LOW (ref 1.7–7.7)
Neutrophils Relative %: 32 %
Platelets: 200 10*3/uL (ref 150–400)
RBC: 3.97 MIL/uL (ref 3.87–5.11)
RDW: 16.3 % — ABNORMAL HIGH (ref 11.5–15.5)
WBC: 4.2 10*3/uL (ref 4.0–10.5)
nRBC: 0 % (ref 0.0–0.2)

## 2021-08-21 LAB — URINALYSIS, ROUTINE W REFLEX MICROSCOPIC
Bilirubin Urine: NEGATIVE
Glucose, UA: NEGATIVE mg/dL
Hgb urine dipstick: NEGATIVE
Ketones, ur: NEGATIVE mg/dL
Nitrite: NEGATIVE
Protein, ur: NEGATIVE mg/dL
Specific Gravity, Urine: 1.013 (ref 1.005–1.030)
pH: 5 (ref 5.0–8.0)

## 2021-08-21 NOTE — ED Triage Notes (Signed)
Pt reported to EA with c/o of "going in and out" since last visit. Pt states her boyfriend says she has periods of confusion after episode. Pt expresses concern for possible dehydration. Denies any chest pain at this time but endorses dizziness and shortness of breath.

## 2021-08-21 NOTE — ED Notes (Signed)
Patient states she is leaving. 

## 2021-08-21 NOTE — ED Provider Triage Note (Signed)
Emergency Medicine Provider Triage Evaluation Note  Kristin [name] , a 46 y.o. female  was evaluated in triage.  Pt complains of easiness, lightheaded, "feeling like a very weird ".  She reports multiple episodes of urinary tract infections, being treated for them several times without much improvement in symptoms.  Feels like she is got some short-term memory loss.  She is followed up with her PCP but they have not been able to find a cause.  She is here today as she reports she has not at her baseline?  She is asking me to obtain more history from her husband who was not in the room at the time.  Review of Systems  Positive: , Weakness, lightheadedness Negative: Pain, shortness of breath, headaches  Physical Exam  BP 98/71   Pulse (!) 101   Temp 98.3 F (36.8 C) (Oral)   Resp 16   SpO2 96%  Gen:   Awake, no distress   Resp:  Normal effort  MSK:   Moves extremities without difficulty  Other:  Tolerating with a cane at baseline.  Medical Decision Making  Medically screening exam initiated at 8:13 PM.  Appropriate orders placed.  LATONDA LARRIVEE was informed that the remainder of the evaluation will be completed by another provider, this initial triage assessment does not replace that evaluation, and the importance of remaining in the ED until their evaluation is complete.     Claude Manges, PA-C 08/21/21 2013

## 2021-08-22 ENCOUNTER — Emergency Department (HOSPITAL_COMMUNITY)
Admission: EM | Admit: 2021-08-22 | Discharge: 2021-08-23 | Disposition: A | Discharge status: Home / Self Care | Payer: 59 | Source: Home / Self Care | Attending: Emergency Medicine | Admitting: Emergency Medicine

## 2021-08-22 DIAGNOSIS — R112 Nausea with vomiting, unspecified: Secondary | ICD-10-CM | POA: Insufficient documentation

## 2021-08-22 DIAGNOSIS — M79662 Pain in left lower leg: Secondary | ICD-10-CM | POA: Insufficient documentation

## 2021-08-22 DIAGNOSIS — N179 Acute kidney failure, unspecified: Secondary | ICD-10-CM | POA: Insufficient documentation

## 2021-08-22 DIAGNOSIS — I10 Essential (primary) hypertension: Secondary | ICD-10-CM | POA: Insufficient documentation

## 2021-08-22 DIAGNOSIS — R109 Unspecified abdominal pain: Secondary | ICD-10-CM

## 2021-08-22 DIAGNOSIS — G8929 Other chronic pain: Secondary | ICD-10-CM

## 2021-08-22 DIAGNOSIS — N390 Urinary tract infection, site not specified: Secondary | ICD-10-CM | POA: Insufficient documentation

## 2021-08-22 DIAGNOSIS — M79661 Pain in right lower leg: Secondary | ICD-10-CM | POA: Insufficient documentation

## 2021-08-22 DIAGNOSIS — Z79899 Other long term (current) drug therapy: Secondary | ICD-10-CM | POA: Insufficient documentation

## 2021-08-22 LAB — COMPREHENSIVE METABOLIC PANEL
ALT: 14 U/L (ref 0–44)
AST: 59 U/L — ABNORMAL HIGH (ref 15–41)
Albumin: 3.8 g/dL (ref 3.5–5.0)
Alkaline Phosphatase: 94 U/L (ref 38–126)
Anion gap: 14 (ref 5–15)
BUN: 30 mg/dL — ABNORMAL HIGH (ref 6–20)
CO2: 20 mmol/L — ABNORMAL LOW (ref 22–32)
Calcium: 9.2 mg/dL (ref 8.9–10.3)
Chloride: 99 mmol/L (ref 98–111)
Creatinine, Ser: 1.7 mg/dL — ABNORMAL HIGH (ref 0.44–1.00)
GFR, Estimated: 37 mL/min — ABNORMAL LOW (ref >60)
Glucose, Bld: 81 mg/dL (ref 70–99)
Potassium: 4 mmol/L (ref 3.5–5.1)
Sodium: 133 mmol/L — ABNORMAL LOW (ref 135–145)
Total Bilirubin: 1.4 mg/dL — ABNORMAL HIGH (ref 0.3–1.2)
Total Protein: 6.4 g/dL — ABNORMAL LOW (ref 6.5–8.1)

## 2021-08-22 LAB — CBC WITH DIFFERENTIAL/PLATELET
Abs Immature Granulocytes: 0.01 10*3/uL (ref 0.00–0.07)
Basophils Absolute: 0.1 10*3/uL (ref 0.0–0.1)
Basophils Relative: 2 %
Eosinophils Absolute: 0.2 10*3/uL (ref 0.0–0.5)
Eosinophils Relative: 4 %
HCT: 33 % — ABNORMAL LOW (ref 36.0–46.0)
Hemoglobin: 10.9 g/dL — ABNORMAL LOW (ref 12.0–15.0)
Immature Granulocytes: 0 %
Lymphocytes Relative: 22 %
Lymphs Abs: 1.1 10*3/uL (ref 0.7–4.0)
MCH: 29.1 pg (ref 26.0–34.0)
MCHC: 33 g/dL (ref 30.0–36.0)
MCV: 88.2 fL (ref 80.0–100.0)
Monocytes Absolute: 0.6 10*3/uL (ref 0.1–1.0)
Monocytes Relative: 12 %
Neutro Abs: 3 10*3/uL (ref 1.7–7.7)
Neutrophils Relative %: 60 %
Platelets: 168 10*3/uL (ref 150–400)
RBC: 3.74 MIL/uL — ABNORMAL LOW (ref 3.87–5.11)
RDW: 16.1 % — ABNORMAL HIGH (ref 11.5–15.5)
WBC: 4.9 10*3/uL (ref 4.0–10.5)
nRBC: 0 % (ref 0.0–0.2)

## 2021-08-22 LAB — I-STAT BETA HCG BLOOD, ED (MC, WL, AP ONLY): I-stat hCG, quantitative: <5 m[IU]/mL (ref <5)

## 2021-08-22 LAB — LIPASE, BLOOD: Lipase: 57 U/L — ABNORMAL HIGH (ref 11–51)

## 2021-08-22 NOTE — ED Provider Triage Note (Signed)
Emergency Medicine Provider Triage Evaluation Note  Kristin [name] , a 46 y.o. female  was evaluated in triage.  Pt complains of worsening abdominal pain with N/V, and worsening leg swelling.  Has a chronic history of the symptoms, but is worsened over the last few days.  Chronic recurrent UTIs, is concerned she might have another one.  Unsure of fevers, denies chills.  Review of Systems  Positive:  Negative: As above  Physical Exam  BP 117/84 (BP Location: Right Arm)   Pulse 97   Temp 99.4 F (37.4 C) (Oral)   Resp 15   SpO2 96%  Gen:   Awake, no distress   Resp:  Normal effort, CTAB MSK:   Moves extremities without difficulty  Other:  No significant swelling appreciated.  Mild generalized abdominal tenderness.  Mild bilateral flank tenderness.  Abdomen soft.  Afebrile.  Medical Decision Making  Medically screening exam initiated at 6:39 PM.  Appropriate orders placed.  Kristin [name] was informed that the remainder of the evaluation will be completed by another provider, this initial triage assessment does not replace that evaluation, and the importance of remaining in the ED until their evaluation is complete.     Cecil Cobbs, PA-C 08/22/21 1841

## 2021-08-22 NOTE — ED Triage Notes (Signed)
Pt reports hx chronic arthritis but states her legs hurt much worse than normal x 3 days. Also reports generalized abdominal pain and n/v, also chronic in nature but worse over the last few days. Unable to tolerate PO intake.

## 2021-08-23 LAB — URINALYSIS, ROUTINE W REFLEX MICROSCOPIC
Bilirubin Urine: NEGATIVE
Glucose, UA: NEGATIVE mg/dL
Hgb urine dipstick: NEGATIVE
Ketones, ur: 5 mg/dL — AB
Nitrite: NEGATIVE
Protein, ur: NEGATIVE mg/dL
Specific Gravity, Urine: 1.017 (ref 1.005–1.030)
WBC, UA: >50 WBC/hpf — ABNORMAL HIGH (ref 0–5)
pH: 5 (ref 5.0–8.0)

## 2021-08-23 MED ORDER — CIPROFLOXACIN HCL 500 MG PO TABS: 500.0000 mg | ORAL_TABLET | Freq: Two times a day (BID) | ORAL | 0 refills | Status: AC

## 2021-08-23 MED ORDER — ONDANSETRON 4 MG PO TBDP
4.0000 mg | ORAL_TABLET | Freq: Three times a day (TID) | ORAL | 0 refills | Status: DC | PRN
Start: 2021-08-23 — End: 2021-08-23

## 2021-08-23 MED ORDER — ALUM & MAG HYDROXIDE-SIMETH 200-200-20 MG/5ML PO SUSP
30.0000 mL | Freq: Once | ORAL | Status: AC
  Administered 2021-08-23: 30 mL via ORAL
  Filled 2021-08-23: qty 30

## 2021-08-23 MED ORDER — LIDOCAINE VISCOUS HCL 2 % MT SOLN
15.0000 mL | Freq: Once | OROMUCOSAL | Status: AC
  Administered 2021-08-23: 15 mL via ORAL
  Filled 2021-08-23: qty 15

## 2021-08-23 MED ORDER — CIPROFLOXACIN IN D5W 400 MG/200ML IV SOLN
400.0000 mg | Freq: Once | INTRAVENOUS | Status: AC
  Administered 2021-08-23: 400 mg via INTRAVENOUS
  Filled 2021-08-23: qty 200

## 2021-08-23 MED ORDER — ONDANSETRON 4 MG PO TBDP: 4.0000 mg | ORAL_TABLET | Freq: Three times a day (TID) | ORAL | 0 refills | Status: AC | PRN

## 2021-08-23 MED ORDER — SODIUM CHLORIDE 0.9 % IV BOLUS
1000.0000 mL | Freq: Once | INTRAVENOUS | Status: AC
  Administered 2021-08-23: 1000 mL via INTRAVENOUS

## 2021-08-23 MED ORDER — METOCLOPRAMIDE HCL 5 MG/ML IJ SOLN
10.0000 mg | Freq: Once | INTRAMUSCULAR | Status: AC
  Administered 2021-08-23: 10 mg via INTRAVENOUS
  Filled 2021-08-23: qty 2

## 2021-08-23 NOTE — ED Provider Notes (Signed)
[facility] EMERGENCY DEPARTMENT Provider Note  CSN: [id number] Arrival date & time: 08/22/21 1802  Chief Complaint(s) Leg Pain, Emesis, and Abdominal Pain  HPI Kristin [name] is a 46 y.o. female with a past medical history listed below including hypertension, bipolar, alcoholism who presents to the emergency department with several days of nausea and nonbloody nonbilious emesis with decreased p.o. intake.  She is endorsing abdominal discomfort with emesis.  Currently denies any abdominal pain.  Reports that she is being treated for urinary tract infection in early May but did not complete her antibiotics because she lost them.  Reports that she feels like she might have another urinary tract infection.  Additionally she is endorsing chronic bilateral lower extremity pain which she attributes to her arthritis.  She denies any acute injuries or trauma.  The history is provided by the patient.    Past Medical History Past Medical History:  Diagnosis Date   Acid reflux    ACL tear 2011   not sure which side   Acute lateral meniscus tear of right knee    Anxiety    Arthritis    Back pain    slipped disc lower back lumbar   Bipolar 1 disorder (HCC)    Depression    Dysfunctional uterine bleeding    Eczema    Hypertension    Insomnia    Migraine headache    Neuromuscular disorder (HCC)    leg cramps   Patient Active Problem List   Diagnosis Date Noted   Tooth ache 06/01/2021   History of recurrent UTIs 04/19/2021   Primary osteoarthritis of both knees 12/02/2020   Chronic bilateral low back pain with bilateral sciatica 12/02/2020   Gastroesophageal reflux disease without esophagitis 12/02/2020   Abnormal urine odor 07/13/2020   Functional diarrhea    Migraine headache    Anemia    Recurrent UTI 08/06/2019   Esophagitis determined by endoscopy    Generalized anxiety disorder 08/23/2015   Osteoarthritis of right knee 12/21/2014   Allergic rhinitis  06/12/2012   Eczema 03/26/2012   Alcohol use disorder, mild, abuse 10/26/2010   Bipolar 1 disorder (HCC) 10/09/2010   Panic attacks 10/09/2010   Insomnia 06/29/2009   TOBACCO USER 01/23/2009   Obesity 10/29/2008   Essential hypertension 12/20/2006   Home Medication(s) Prior to Admission medications   Medication Sig Start Date End Date Taking? Authorizing Provider  ciprofloxacin (CIPRO) 500 MG tablet Take 1 tablet (500 mg total) by mouth every 12 (twelve) hours for 10 days. 08/23/21 09/02/21 Yes Daniya Aramburo, Amadeo Garnet, MD  acetaminophen (TYLENOL) 500 MG tablet Take 1 tablet (500 mg total) by mouth every 8 (eight) hours as needed for mild pain. 07/04/19   Marthenia Rolling, DO  cyclobenzaprine (FLEXERIL) 10 MG tablet Take 1 tablet (10 mg total) by mouth 3 (three) times daily as needed for muscle spasms. 04/18/21   Derrel Nip, MD  diphenhydrAMINE (BENADRYL) 25 MG tablet Take 25 mg by mouth every 6 (six) hours as needed for allergies, sleep or itching.    [provider]  ferrous sulfate 325 (65 FE) MG tablet Take 1 tablet (325 mg total) by mouth daily. Patient not taking: Reported on 12/01/2020 05/31/20 06/30/20  Cora Collum, DO  gabapentin (NEURONTIN) 300 MG capsule Take 1 capsule (300 mg total) by mouth 3 (three) times daily. 12/01/20   Mayers, Cari S, PA-C  lisinopril-hydrochlorothiazide (ZESTORETIC) 20-25 MG tablet Take 1 tablet by mouth daily. 07/12/21   Autry-Lott, Randa Evens, DO  meloxicam (MOBIC) 15 MG tablet Take 1 tablet (15 mg total) by mouth daily. Patient not taking: Reported on 01/07/2021 12/01/20   Mayers, Cari S, PA-C  OLANZapine (ZYPREXA) 5 MG tablet Take 1 tablet (5 mg total) by mouth at bedtime. 01/07/21   Littie Deeds, MD  ondansetron (ZOFRAN-ODT) 4 MG disintegrating tablet Take 1 tablet (4 mg total) by mouth every 8 (eight) hours as needed for up to 3 days for nausea or vomiting. 08/23/21 08/26/21  Zaylia Riolo, Amadeo Garnet, MD  pantoprazole (PROTONIX) 40 MG tablet Take 1 tablet  (40 mg total) by mouth daily. 01/07/21   Littie Deeds, MD  thiamine 100 MG tablet Take 1 tablet (100 mg total) by mouth daily. Patient not taking: Reported on 01/07/2021 05/31/20   Cora Collum, DO  traZODone (DESYREL) 50 MG tablet Take 1 tablet (50 mg total) by mouth at bedtime as needed for sleep. 04/18/21   Derrel Nip, MD  Vitamin D, Ergocalciferol, (DRISDOL) 1.25 MG (50000 UNIT) CAPS capsule Take 1 capsule (50,000 Units total) by mouth every 7 (seven) days. 12/02/20   Mayers, Cari S, PA-C  fluticasone (FLONASE) 50 MCG/ACT nasal spray Place 2 sprays into both nostrils daily. Patient taking differently: Place 2 sprays into both nostrils daily as needed for allergies.  02/25/17 04/14/19  Belinda Fisher, PA-C  promethazine (PHENERGAN) 25 MG tablet Take 1 tablet (25 mg total) by mouth every 6 (six) hours as needed for nausea or vomiting. 04/17/18 08/29/18  Jacalyn Lefevre, MD                                                                                                                                    Allergies Sumatriptan, Other, and Eggs or egg-derived products  Review of Systems Review of Systems As noted in HPI  Physical Exam Vital Signs  I have reviewed the triage vital signs BP 114/74 (BP Location: Right Arm)   Pulse 84   Temp 99.1 F (37.3 C) (Oral)   Resp 16   SpO2 100%   Physical Exam Vitals reviewed.  Constitutional:      General: She is not in acute distress.    Appearance: She is well-developed. She is not diaphoretic.  HENT:     Head: Normocephalic and atraumatic.     Right Ear: External ear normal.     Left Ear: External ear normal.     Nose: Nose normal.  Eyes:     General: No scleral icterus.    Conjunctiva/sclera: Conjunctivae normal.  Neck:     Trachea: Phonation normal.  Cardiovascular:     Rate and Rhythm: Regular rhythm.  Pulmonary:     Effort: Pulmonary effort is normal. No respiratory distress.     Breath sounds: No stridor.  Abdominal:      General: There is no distension.     Tenderness: There is no abdominal tenderness. There is no guarding or rebound.  Musculoskeletal:  General: Normal range of motion.     Cervical back: Normal range of motion.  Neurological:     Mental Status: She is alert and oriented to person, place, and time.  Psychiatric:        Behavior: Behavior normal.     ED Results and Treatments Labs (all labs ordered are listed, but only abnormal results are displayed) Labs Reviewed  CBC WITH DIFFERENTIAL/PLATELET - Abnormal; Notable for the following components:      Result Value   RBC 3.74 (*)    Hemoglobin 10.9 (*)    HCT 33.0 (*)    RDW 16.1 (*)    All other components within normal limits  COMPREHENSIVE METABOLIC PANEL - Abnormal; Notable for the following components:   Sodium 133 (*)    CO2 20 (*)    BUN 30 (*)    Creatinine, Ser 1.70 (*)    Total Protein 6.4 (*)    AST 59 (*)    Total Bilirubin 1.4 (*)    GFR, Estimated 37 (*)    All other components within normal limits  LIPASE, BLOOD - Abnormal; Notable for the following components:   Lipase 57 (*)    All other components within normal limits  URINALYSIS, ROUTINE W REFLEX MICROSCOPIC - Abnormal; Notable for the following components:   APPearance CLOUDY (*)    Ketones, ur 5 (*)    Leukocytes,Ua LARGE (*)    WBC, UA >50 (*)    Bacteria, UA RARE (*)    All other components within normal limits  I-STAT BETA HCG BLOOD, ED ([facility], [facility], [facility] ONLY)                                                                                                                         EKG  EKG Interpretation  Date/Time:    Ventricular Rate:    PR Interval:    QRS Duration:   QT Interval:    QTC Calculation:   R Axis:     Text Interpretation:         Radiology No results found.  Pertinent labs & imaging results that were available during my care of the patient were reviewed by me and considered in my medical decision making (see MDM for  details).  Medications Ordered in ED Medications  metoCLOPramide (REGLAN) injection 10 mg (10 mg Intravenous Given 08/23/21 0509)  sodium chloride 0.9 % bolus 1,000 mL (0 mLs Intravenous Stopped 08/23/21 0602)  alum & mag hydroxide-simeth (MAALOX/MYLANTA) 200-200-20 MG/5ML suspension 30 mL (30 mLs Oral Given 08/23/21 0509)    And  lidocaine (XYLOCAINE) 2 % viscous mouth solution 15 mL (15 mLs Oral Given 08/23/21 0509)  ciprofloxacin (CIPRO) IVPB 400 mg (400 mg Intravenous New Bag/Given 08/23/21 0543)  Procedures Procedures  (including critical care time)  Medical Decision Making / ED Course       ED Course:    N/V   Abdomen benign. We will obtain screening labs to assess for any electrolyte or metabolic derangements or renal sufficiency. Considered pancreatitis given her history of alcoholism but patient has no epigastric abdominal tenderness.  Also less likely cholecystitis.  Given her urinary symptoms, considering UTI/pyelonephritis though no flank pain is noted.  In the interim we will provide patient with antiemetics, IV fluids.  CBC without leukocytosis. Metabolic panel without significant electrolyte derangements.  Patient does have mild renal sufficiency.  No evidence of bili obstruction or pancreatitis. UA is consistent with a urinary tract infection. Patient was given first dose of antibiotics in the ED  She was able to tolerate oral intake.  Feel she is appropriate for outpatient management.  Patient has chronic leg pain without any acute changes.  No need for work-up.  Medical Decision Making Problems Addressed: AKI (acute kidney injury) [facility]): acute illness or injury Lower urinary tract infectious disease: complicated acute illness or injury Nausea and vomiting in adult: acute illness or injury with systemic symptoms  Amount and/or  Complexity of Data Reviewed Labs: ordered. Decision-making details documented in ED Course.  Risk OTC drugs. Prescription drug management.    Final Clinical Impression(s) / ED Diagnoses Final diagnoses:  Nausea and vomiting in adult  Abdominal cramping  Chronic pain of both lower extremities  AKI (acute kidney injury) (HCC)  Lower urinary tract infectious disease   The patient appears reasonably screened and/or stabilized for discharge and I doubt any other medical condition or other [facility] requiring further screening, evaluation, or treatment in the ED at this time prior to discharge. Safe for discharge with strict return precautions.  Disposition: Discharge  Condition: Good  I have discussed the results, Dx and Tx plan with the patient/family who expressed understanding and agree(s) with the plan. Discharge instructions discussed at length. The patient/family was given strict return precautions who verbalized understanding of the instructions. No further questions at time of discharge.    ED Discharge Orders          Ordered    ondansetron (ZOFRAN-ODT) 4 MG disintegrating tablet  Every 8 hours PRN,   Status:  Discontinued        08/23/21 0419    ciprofloxacin (CIPRO) 500 MG tablet  Every 12 hours        08/23/21 0727    ondansetron (ZOFRAN-ODT) 4 MG disintegrating tablet  Every 8 hours PRN        08/23/21 8416             Follow Up: Primary care provider  Call  to schedule an appointment for close follow up           This chart was dictated using voice recognition software.  Despite best efforts to proofread,  errors can occur which can change the documentation meaning.    Nira Conn, MD 08/23/21 [phone]

## 2021-08-23 NOTE — ED Notes (Signed)
Reviewed discharge instructions with patient. Follow-up care and medications reviewed. Patient verbalized understanding. Patient A&Ox4, VSS, and ambulatory with steady gait w/ cane upon discharge.

## 2021-09-26 NOTE — Progress Notes (Deleted)
     SUBJECTIVE:   CHIEF COMPLAINT / HPI:   Kristin [name] is a 46 y.o. female presents for ***  Back Pain Onset: *** Location/Radiation: *** Duration: ***  Character: ***  Aggravating Factors: ***  Alleviating Factors: ***  Timing: *** Severity: ***  Denies *** history of trauma, history of prolonged steroid use, bowel or bladder incontinence, urinary retention, numbness or tingling in extremities or saddle anesthesia, weakness in extremities, fevers, chills, IV drug use, hemodialysis, hx cancer, changes in weight, night sweats. No history of osteoporosis ***.  No history of prostate cancer ***.   Occupation: ***No heavy lifting, repetitive vibrations, bending or twisting motions. ***  Flowsheet Row Office Visit from 07/12/2021 in [facility]  PHQ-9 Total Score 17        Health Maintenance Due  Topic   COVID-19 Vaccine (3 - Pfizer series)      PERTINENT  PMH / PSH:   OBJECTIVE:   There were no vitals taken for this visit.   General: Alert, no acute distress Cardio: Normal S1 and S2, RRR, no r/m/g Pulm: CTAB, normal work of breathing Abdomen: Bowel sounds normal. Abdomen soft and non-tender.  Extremities: No peripheral edema.  Neuro: Cranial nerves grossly intact   Back Normal skin, Spine with normal alignment and no deformity.  No tenderness to vertebral process palpation.  Paraspinous muscles are not tender and without spasm.   Range of motion is full at neck and lumbar sacral regions. Negative straight leg test.  ASSESSMENT/PLAN:   No problem-specific Assessment & Plan notes found for this encounter.    Towanda Octave, MD PGY-3 [facility]

## 2021-09-27 ENCOUNTER — Ambulatory Visit: Payer: 59 | Admitting: Family Medicine

## 2021-10-03 ENCOUNTER — Ambulatory Visit: Payer: 59 | Admitting: Family Medicine

## 2021-10-04 ENCOUNTER — Ambulatory Visit: Payer: 59

## 2021-10-04 NOTE — Progress Notes (Deleted)
     SUBJECTIVE:   CHIEF COMPLAINT / HPI:   Kristin [name] is a 46 y.o. female presents for ***   ***  Flowsheet Row Office Visit from 07/12/2021 in [facility]  PHQ-9 Total Score 17        Health Maintenance Due  Topic   COVID-19 Vaccine (3 - Pfizer series)      PERTINENT  PMH / PSH:   OBJECTIVE:   There were no vitals taken for this visit.   General: Alert, no acute distress Cardio: Normal S1 and S2, RRR, no r/m/g Pulm: CTAB, normal work of breathing Abdomen: Bowel sounds normal. Abdomen soft and non-tender.  Extremities: No peripheral edema.  Neuro: Cranial nerves grossly intact   ASSESSMENT/PLAN:   No problem-specific Assessment & Plan notes found for this encounter.    Towanda Octave, MD PGY-3 [facility]

## 2021-10-06 ENCOUNTER — Other Ambulatory Visit: Payer: Self-pay | Admitting: Family Medicine

## 2021-10-14 ENCOUNTER — Ambulatory Visit (HOSPITAL_COMMUNITY)
Admission: EM | Admit: 2021-10-14 | Discharge: 2021-10-14 | Disposition: A | Discharge status: Home / Self Care | Payer: Self-pay | Attending: Family Medicine | Admitting: Family Medicine

## 2021-10-14 DIAGNOSIS — K047 Periapical abscess without sinus: Secondary | ICD-10-CM

## 2021-10-14 MED ORDER — KETOROLAC TROMETHAMINE 30 MG/ML IJ SOLN
INTRAMUSCULAR | Status: AC
  Filled 2021-10-14: qty 1

## 2021-10-14 MED ORDER — KETOROLAC TROMETHAMINE 30 MG/ML IJ SOLN
30.0000 mg | Freq: Once | INTRAMUSCULAR | Status: AC
Start: 2021-10-14 — End: 2021-10-14
  Administered 2021-10-14: 30 mg via INTRAMUSCULAR

## 2021-10-14 MED ORDER — IBUPROFEN 800 MG PO TABS: 800.0000 mg | ORAL_TABLET | Freq: Three times a day (TID) | ORAL | 0 refills | Status: DC | PRN

## 2021-10-14 MED ORDER — AMOXICILLIN-POT CLAVULANATE 875-125 MG PO TABS: 1.0000 | ORAL_TABLET | Freq: Two times a day (BID) | ORAL | 0 refills | Status: AC

## 2021-10-14 NOTE — ED Triage Notes (Signed)
[facility] with [facility] of [facility] pain pt reports filling fell out a while ago and she has new onset of dental pain and facial swelling

## 2021-10-14 NOTE — Discharge Instructions (Addendum)
You have been given a shot of Toradol 30 mg today.  ? ?Take amoxicillin-clavulanate 875 mg--1 tab twice daily with food for 7 days  ? ?Take ibuprofen 800 mg--1 tab every 8 hours as needed for pain.  ?

## 2021-10-14 NOTE — ED Provider Notes (Addendum)
[facility]    CSN: [id number] Arrival date & time: 10/14/21  1255      History   Chief Complaint Chief Complaint  Patient presents with   Dental Pain    HPI Kristin [name] is a 46 y.o. female.    Dental Pain  Here for left cheek and jaw pain that is been going on for about a week.  She is also had swelling there.  It hurts to chew.  No fever or chills noted.  She has had a uterine ablation.    Past Medical History:  Diagnosis Date   Acid reflux    ACL tear 2011   not sure which side   Acute lateral meniscus tear of right knee    Anxiety    Arthritis    Back pain    slipped disc lower back lumbar   Bipolar 1 disorder (HCC)    Depression    Dysfunctional uterine bleeding    Eczema    Hypertension    Insomnia    Migraine headache    Neuromuscular disorder (HCC)    leg cramps    Patient Active Problem List   Diagnosis Date Noted   Tooth ache 06/01/2021   History of recurrent UTIs 04/19/2021   Primary osteoarthritis of both knees 12/02/2020   Chronic bilateral low back pain with bilateral sciatica 12/02/2020   Gastroesophageal reflux disease without esophagitis 12/02/2020   Abnormal urine odor 07/13/2020   Functional diarrhea    Migraine headache    Anemia    Recurrent UTI 08/06/2019   Esophagitis determined by endoscopy    Generalized anxiety disorder 08/23/2015   Osteoarthritis of right knee 12/21/2014   Allergic rhinitis 06/12/2012   Eczema 03/26/2012   Alcohol use disorder, mild, abuse 10/26/2010   Bipolar 1 disorder (HCC) 10/09/2010   Panic attacks 10/09/2010   Insomnia 06/29/2009   TOBACCO USER 01/23/2009   Obesity 10/29/2008   Essential hypertension 12/20/2006    Past Surgical History:  Procedure Laterality Date   BIOPSY  09/19/2017   Procedure: BIOPSY;  Surgeon: Kathi Der, MD;  [facility]: [facility];  Service: Gastroenterology;;   BIOPSY  10/15/2019   Procedure: BIOPSY;  Surgeon: Charlott Rakes, MD;  Location: [facility];  Service: Endoscopy;;   COLONOSCOPY N/A 10/15/2019   Procedure: COLONOSCOPY;  Surgeon: Charlott Rakes, MD;  Location: [facility];  Service: Endoscopy;  Laterality: N/A;   DILITATION & CURRETTAGE/HYSTROSCOPY WITH HYDROTHERMAL ABLATION N/A 04/04/2017   Procedure: DILATATION & CURETTAGE/HYSTEROSCOPY WITH HYDROTHERMAL ABLATION;  Surgeon: Reva Bores, MD;  Location: [facility];  Service: Gynecology;  Laterality: N/A;   ESOPHAGOGASTRODUODENOSCOPY N/A 10/15/2019   Procedure: ESOPHAGOGASTRODUODENOSCOPY (EGD);  Surgeon: Charlott Rakes, MD;  Location: [facility];  Service: Endoscopy;  Laterality: N/A;   ESOPHAGOGASTRODUODENOSCOPY (EGD) WITH PROPOFOL N/A 09/19/2017   Procedure: ESOPHAGOGASTRODUODENOSCOPY (EGD) WITH PROPOFOL;  Surgeon: Kathi Der, MD;  Location: [facility];  Service: Gastroenterology;  Laterality: N/A;   KNEE ARTHROSCOPY WITH LATERAL MENISECTOMY Right 09/30/2019   Procedure: KNEE ARTHROSCOPY WITH LATERAL MENISECTOMY;  Surgeon: Sheral Apley, MD;  Location: [facility];  Service: Orthopedics;  Laterality: Right;   TUBAL LIGATION  yrs ago    OB History     Gravida  4   Para  3   Term  3   Preterm      AB  1   Living  3      SAB      IAB  1  Ectopic      Multiple      Live Births               Home Medications    Prior to Admission medications   Medication Sig Start Date End Date Taking? Authorizing Provider  amoxicillin-clavulanate (AUGMENTIN) 875-125 MG tablet Take 1 tablet by mouth 2 (two) times daily for 7 days. 10/14/21 10/21/21 Yes Landis Dowdy, Janace Aris, MD  ibuprofen (ADVIL) 800 MG tablet Take 1 tablet (800 mg total) by mouth every 8 (eight) hours as needed (pain). 10/14/21  Yes Zenia Resides, MD  acetaminophen (TYLENOL) 500 MG tablet Take 1 tablet (500 mg total) by mouth every 8 (eight) hours as needed for mild pain. 07/04/19   Marthenia Rolling, DO  cyclobenzaprine (FLEXERIL) 10 MG tablet Take 1 tablet  (10 mg total) by mouth 3 (three) times daily as needed for muscle spasms. 04/18/21   Celedonio Savage, MD  diphenhydrAMINE (BENADRYL) 25 MG tablet Take 25 mg by mouth every 6 (six) hours as needed for allergies, sleep or itching.    [provider]  ferrous sulfate 325 (65 FE) MG tablet Take 1 tablet (325 mg total) by mouth daily. Patient not taking: Reported on 12/01/2020 05/31/20 06/30/20  Cora Collum, DO  gabapentin (NEURONTIN) 300 MG capsule Take 1 capsule (300 mg total) by mouth 3 (three) times daily. 12/01/20   Mayers, Cari S, PA-C  lisinopril-hydrochlorothiazide (ZESTORETIC) 10-12.5 MG tablet TAKE 1 TABLET BY MOUTH DAILY 10/07/21   Jerre Simon, MD  lisinopril-hydrochlorothiazide (ZESTORETIC) 20-25 MG tablet Take 1 tablet by mouth daily. 07/12/21   Autry-Lott, Randa Evens, DO  OLANZapine (ZYPREXA) 5 MG tablet Take 1 tablet (5 mg total) by mouth at bedtime. 01/07/21   Littie Deeds, MD  pantoprazole (PROTONIX) 40 MG tablet Take 1 tablet (40 mg total) by mouth daily. 01/07/21   Littie Deeds, MD  thiamine 100 MG tablet Take 1 tablet (100 mg total) by mouth daily. Patient not taking: Reported on 01/07/2021 05/31/20   Cora Collum, DO  traZODone (DESYREL) 50 MG tablet Take 1 tablet (50 mg total) by mouth at bedtime as needed for sleep. 04/18/21   Cresenzo, Cyndi Lennert, MD  Vitamin D, Ergocalciferol, (DRISDOL) 1.25 MG (50000 UNIT) CAPS capsule Take 1 capsule (50,000 Units total) by mouth every 7 (seven) days. 12/02/20   Mayers, Cari S, PA-C  fluticasone (FLONASE) 50 MCG/ACT nasal spray Place 2 sprays into both nostrils daily. Patient taking differently: Place 2 sprays into both nostrils daily as needed for allergies.  02/25/17 04/14/19  Belinda Fisher, PA-C  promethazine (PHENERGAN) 25 MG tablet Take 1 tablet (25 mg total) by mouth every 6 (six) hours as needed for nausea or vomiting. 04/17/18 08/29/18  Jacalyn Lefevre, MD    Family History Family History  Problem Relation Age of Onset   Breast cancer  Maternal Grandmother    Hypertension Mother    Hypertension Brother    Colon cancer Maternal Uncle    Bipolar disorder Cousin    Schizophrenia Cousin     Social History Social History   Tobacco Use   Smoking status: Some Days    Packs/day: 0.50    Years: 5.00    Total pack years: 2.50    Types: Cigarettes   Smokeless tobacco: Never  Vaping Use   Vaping Use: Never used  Substance Use Topics   Alcohol use: Yes    Comment: occassionally, longer than one week ago   Drug use: No  Allergies   Sumatriptan, Other, and Eggs or egg-derived products   Review of Systems Review of Systems   Physical Exam Triage Vital Signs ED Triage Vitals  Enc Vitals Group     BP 10/14/21 1307 111/75     Pulse Rate 10/14/21 1307 95     Resp 10/14/21 1307 18     Temp 10/14/21 1307 98.6 F (37 C)     Temp src --      SpO2 10/14/21 1307 94 %     Weight --      Height --      Head Circumference --      Peak Flow --      Pain Score 10/14/21 1309 10     Pain Loc --      Pain Edu? --      Excl. in GC? --    No data found.  Updated Vital Signs BP 111/75   Pulse 95   Temp 98.6 F (37 C)   Resp 18   SpO2 94%   Visual Acuity Right Eye Distance:   Left Eye Distance:   Bilateral Distance:    Right Eye Near:   Left Eye Near:    Bilateral Near:     Physical Exam Vitals reviewed.  Constitutional:      General: She is not in acute distress.    Appearance: She is not ill-appearing, toxic-appearing or diaphoretic.  HENT:     Head:     Comments: There is swelling of her cheek on the left that is diffuse.  No definite fluctuance noted    Mouth/Throat:     Mouth: Mucous membranes are moist.     Pharynx: No oropharyngeal exudate or posterior oropharyngeal erythema.     Comments: There is a broken carious tooth in the left lower posterior dental ridge.  There is no swelling inside the mouth Cardiovascular:     Rate and Rhythm: Normal rate and regular rhythm.     Heart sounds: No  murmur heard. Pulmonary:     Effort: Pulmonary effort is normal.     Breath sounds: Normal breath sounds.  Skin:    Coloration: Skin is not jaundiced or pale.  Neurological:     Mental Status: She is alert and oriented to person, place, and time.  Psychiatric:        Behavior: Behavior normal.      UC Treatments / Results  Labs (all labs ordered are listed, but only abnormal results are displayed) Labs Reviewed - No data to display  EKG   Radiology No results found.  Procedures Procedures (including critical care time)  Medications Ordered in UC Medications  ketorolac (TORADOL) 30 MG/ML injection 30 mg (has no administration in time range)    Initial Impression / Assessment and Plan / UC Course  I have reviewed the triage vital signs and the nursing notes.  Pertinent labs & imaging results that were available during my care of the patient were reviewed by me and considered in my medical decision making (see chart for details).     I will treat with Augmentin for the infection, and Toradol and ibuprofen for the pain.  She is working on finding a Education officer, community. Final Clinical Impressions(s) / UC Diagnoses   Final diagnoses:  Dental infection     Discharge Instructions      You have been given a shot of Toradol 30 mg today.  Take amoxicillin-clavulanate 875 mg--1 tab twice daily with food for 7  days  Take ibuprofen 800 mg--1 tab every 8 hours as needed for pain.       ED Prescriptions     Medication Sig Dispense Auth. Provider   amoxicillin-clavulanate (AUGMENTIN) 875-125 MG tablet Take 1 tablet by mouth 2 (two) times daily for 7 days. 14 tablet Preet Perrier, Janace Aris, MD   ibuprofen (ADVIL) 800 MG tablet Take 1 tablet (800 mg total) by mouth every 8 (eight) hours as needed (pain). 21 tablet Jennea Rager, Janace Aris, MD      PDMP not reviewed this encounter.   Zenia Resides, MD 10/14/21 1415    Zenia Resides, MD 10/14/21 1416

## 2021-10-18 ENCOUNTER — Ambulatory Visit: Payer: 59

## 2021-10-18 NOTE — Progress Notes (Deleted)
    SUBJECTIVE:   CHIEF COMPLAINT / HPI:   Reflux Protonix 40 mg daily.   Alarm Features? New onset dyspepsia over 60- IDA- Anorexia- Unexplained weight loss- Anorexia- Dysphagia- Odynophagia- Persistent emesis GI cancer in first relative   Back Pain   Spinal stenosis of lumbar region with neurogenic claudication Was referred to neurosurgery ([place])  Dental resources Recently seen in ED for dental infection. Was rpescribed augmentin course for 7 days. ***  PERTINENT  PMH / PSH: AUD, tobacco use hx***, HTN  OBJECTIVE:   There were no vitals taken for this visit.  ***  ASSESSMENT/PLAN:   No problem-specific Assessment & Plan notes found for this encounter.     Levin Erp, MD [facility]

## 2021-10-25 ENCOUNTER — Other Ambulatory Visit: Payer: Self-pay

## 2021-10-25 ENCOUNTER — Other Ambulatory Visit: Payer: Self-pay | Admitting: Student

## 2021-10-25 ENCOUNTER — Encounter: Payer: Self-pay | Admitting: Student

## 2021-10-25 ENCOUNTER — Ambulatory Visit (INDEPENDENT_AMBULATORY_CARE_PROVIDER_SITE_OTHER): Payer: Self-pay | Admitting: Student

## 2021-10-25 VITALS — BP 122/91 | HR 93 | Wt 165.0 lb

## 2021-10-25 DIAGNOSIS — K219 Gastro-esophageal reflux disease without esophagitis: Secondary | ICD-10-CM

## 2021-10-25 DIAGNOSIS — R1084 Generalized abdominal pain: Secondary | ICD-10-CM

## 2021-10-25 MED ORDER — ONDANSETRON HCL 4 MG PO TABS: 4.0000 mg | ORAL_TABLET | Freq: Three times a day (TID) | ORAL | 0 refills | Status: DC | PRN

## 2021-10-25 MED ORDER — PANTOPRAZOLE SODIUM 40 MG PO TBEC: 40.0000 mg | DELAYED_RELEASE_TABLET | Freq: Every day | ORAL | 1 refills | Status: DC

## 2021-10-25 MED ORDER — FAMOTIDINE 20 MG PO TABS: 20.0000 mg | ORAL_TABLET | Freq: Two times a day (BID) | ORAL | 0 refills | Status: DC

## 2021-10-25 NOTE — Patient Instructions (Signed)
It was great to see you! Thank you for allowing me to participate in your care!  Our plans for today:  - STOP taking ibuprofen until you follow up with the stomach doctor. You can take tylenol for pain - I have sent in 2 medications to your pharmacy for acid reflux. I also sent in a medication for nausea called  Zofran.  -I referred you to the stomach doctor, they will call to schedule an appointment  We are checking some labs today, I will call you if they are abnormal will send you a MyChart message or a letter if they are normal.  If you do not hear about your labs in the next 2 weeks please let us know.  Take care and seek immediate care sooner if you develop any concerns.   Dr. Erick Alley, DO [facility]

## 2021-10-25 NOTE — Progress Notes (Unsigned)
    SUBJECTIVE:   CHIEF COMPLAINT / HPI:   Abdominal pain Pt states this episode of intermittent abdominal pain has been going on for past week and a half, is very sharp like being stabbed in the middle of her stomach. She has tried goody powder, mylanta, ibuprofen, tylenol OTC which help some but not enough. Has been out of protonix. Pain is worsened with eating, especially fatty and acidic foods. She suspects the pain is related to acid reflux. She admits to non bilious vomiting every morning, sometimes speckled with blood. Admits to intermittent watery non bloody diarrhea with the pain, occurring every couple days. Thinks she may feel feverish but hasn't checked temp.   She does have a long history of having similar abdominal pain with vomiting and diarrhea and has seen GI in the past with diagnoses of esophagitis and gastritis. Per chart review, was last seen by GI in 2022 by Dr. Charna Elizabeth who thought pt should have RUQ [place] and HIDA scan with CCK injection and CT enterography to r/o IBD however I do not see evidence that these were completed. Pt states she had issues with insurance.     PERTINENT  PMH / PSH: Esophagitis, GERD  OBJECTIVE:   BP (!) 122/91   Pulse 93   Wt 165 lb (74.8 kg)   SpO2 100%   BMI 27.88 kg/m    General: NAD, pleasant, able to participate in exam Cardiac: RRR, no murmurs. Respiratory: CTAB, normal effort, No wheezes, rales or rhonchi Abdomen: Bowel sounds present, nondistended, soft, diffuse tenderness to palpation without guarding, murphy's sign negative  Skin: warm and dry, no rashes noted Neuro: alert, no obvious focal deficits Psych: Normal affect and mood  ASSESSMENT/PLAN:   Abdominal pain Unsure of etiology of abdominal pain but physical exam is reassuring against acute abdomen. This is likely in part related to acid reflux as pt has been out of protonix. Can also consider gastric ulcer as pt reports specs of blood in vomit. Pt advised to stop taking  ibuprofen, can take tylenol. IBD is a possibility as this has not been previously ruled out and could explain diarrhea although diarrhea is non bloody per pt. D/t long hx of similar pain, pt would likely benefit from returning to follow with GI. Will order labs to look for pancreatitis, liver/galbladder pathology and check for anemia. -CMP -CBC -Lipase -referral to GI -Zofran prn -Protonix 40 mg daily -famotidine 20 mg BID -f/u apt with PCP scheduled for 9/5 to discuss other medication refills if appropriate   Dr. Erick Alley, DO [facility]

## 2021-10-26 DIAGNOSIS — R1084 Generalized abdominal pain: Secondary | ICD-10-CM | POA: Insufficient documentation

## 2021-10-26 LAB — CBC
Hematocrit: 25.8 % — ABNORMAL LOW (ref 34.0–46.6)
Hemoglobin: 8.7 g/dL — ABNORMAL LOW (ref 11.1–15.9)
MCH: 30.9 pg (ref 26.6–33.0)
MCHC: 33.7 g/dL (ref 31.5–35.7)
MCV: 92 fL (ref 79–97)
Platelets: 284 10*3/uL (ref 150–450)
RBC: 2.82 x10E6/uL — ABNORMAL LOW (ref 3.77–5.28)
RDW: 15 % (ref 11.7–15.4)
WBC: 4.3 10*3/uL (ref 3.4–10.8)

## 2021-10-26 LAB — COMPREHENSIVE METABOLIC PANEL
ALT: 7 IU/L (ref 0–32)
AST: 32 IU/L (ref 0–40)
Albumin/Globulin Ratio: 1.4 (ref 1.2–2.2)
Albumin: 3.7 g/dL — ABNORMAL LOW (ref 3.9–4.9)
Alkaline Phosphatase: 122 IU/L — ABNORMAL HIGH (ref 44–121)
BUN/Creatinine Ratio: 6 — ABNORMAL LOW (ref 9–23)
BUN: 6 mg/dL (ref 6–24)
Bilirubin Total: 0.5 mg/dL (ref 0.0–1.2)
CO2: 24 mmol/L (ref 20–29)
Calcium: 9.2 mg/dL (ref 8.7–10.2)
Chloride: 96 mmol/L (ref 96–106)
Creatinine, Ser: 1.05 mg/dL — ABNORMAL HIGH (ref 0.57–1.00)
Globulin, Total: 2.6 g/dL (ref 1.5–4.5)
Glucose: 75 mg/dL (ref 70–99)
Potassium: 3.6 mmol/L (ref 3.5–5.2)
Sodium: 141 mmol/L (ref 134–144)
Total Protein: 6.3 g/dL (ref 6.0–8.5)
eGFR: 66 mL/min/{1.73_m2} (ref >59)

## 2021-10-26 LAB — LIPASE: Lipase: 12 U/L — ABNORMAL LOW (ref 14–72)

## 2021-10-26 NOTE — Assessment & Plan Note (Signed)
Unsure of etiology of abdominal pain but physical exam is reassuring against acute abdomen. This is likely in part related to acid reflux as pt has been out of protonix. Can also consider gastric ulcer as pt reports specs of blood in vomit. IBD is a possibility as this has not been previously ruled out and could explain diarrhea although diarrhea is non bloody per pt. D/t long hx of similar pain, pt would likely benefit from returning to follow with GI. Will order labs to look for pancreatitis, liver/galbladder pathology and check for anemia. -CMP -CBC -Lipase -referral to GI -Zofran prn -Protonix 40 mg daily -famotidine 20 mg BID

## 2021-10-27 ENCOUNTER — Telehealth: Payer: Self-pay | Admitting: Student

## 2021-10-27 NOTE — Telephone Encounter (Signed)
Called to speak with pt about recent lab results. Hgb has had significant drop from 2 months ago, 10.9>8.7 indicating normocytic aneamia. Pt stated she does occasionally feel light headed. Her vitals were stable at last visit when labs were drawn. Discussed return/ED precautions with pt including increased lightheadedness, LOC, tachycardia, SOB, chest pain. She is aware of follow up apt with Dr. Elliot Gurney on 9/5 and can have Hgb, ferritin, TIBC checked at that visit if Dr. Elliot Gurney desires to determine if iron supplementation is needed.

## 2021-11-08 ENCOUNTER — Emergency Department (HOSPITAL_BASED_OUTPATIENT_CLINIC_OR_DEPARTMENT_OTHER)
Admission: EM | Admit: 2021-11-08 | Discharge: 2021-11-09 | Disposition: A | Discharge status: Home / Self Care | Payer: Medicaid Other | Attending: Emergency Medicine | Admitting: Emergency Medicine

## 2021-11-08 ENCOUNTER — Ambulatory Visit (INDEPENDENT_AMBULATORY_CARE_PROVIDER_SITE_OTHER): Payer: Commercial Managed Care - HMO

## 2021-11-08 ENCOUNTER — Other Ambulatory Visit: Payer: Self-pay

## 2021-11-08 ENCOUNTER — Emergency Department (HOSPITAL_COMMUNITY)
Admission: EM | Admit: 2021-11-08 | Discharge: 2021-11-08 | Discharge status: Against Medical Advice | Payer: Medicaid Other

## 2021-11-08 DIAGNOSIS — Z111 Encounter for screening for respiratory tuberculosis: Secondary | ICD-10-CM

## 2021-11-08 DIAGNOSIS — I10 Essential (primary) hypertension: Secondary | ICD-10-CM | POA: Insufficient documentation

## 2021-11-08 DIAGNOSIS — R112 Nausea with vomiting, unspecified: Secondary | ICD-10-CM

## 2021-11-08 DIAGNOSIS — K529 Noninfective gastroenteritis and colitis, unspecified: Secondary | ICD-10-CM | POA: Insufficient documentation

## 2021-11-08 DIAGNOSIS — R1084 Generalized abdominal pain: Secondary | ICD-10-CM

## 2021-11-08 DIAGNOSIS — K219 Gastro-esophageal reflux disease without esophagitis: Secondary | ICD-10-CM

## 2021-11-08 LAB — URINALYSIS, ROUTINE W REFLEX MICROSCOPIC
Bilirubin Urine: NEGATIVE
Glucose, UA: NEGATIVE mg/dL
Hgb urine dipstick: NEGATIVE
Ketones, ur: NEGATIVE mg/dL
Nitrite: NEGATIVE
Protein, ur: NEGATIVE mg/dL
Specific Gravity, Urine: 1.013 (ref 1.005–1.030)
pH: 5 (ref 5.0–8.0)

## 2021-11-08 LAB — COMPREHENSIVE METABOLIC PANEL
ALT: 16 U/L (ref 0–44)
AST: 76 U/L — ABNORMAL HIGH (ref 15–41)
Albumin: 3.4 g/dL — ABNORMAL LOW (ref 3.5–5.0)
Alkaline Phosphatase: 116 U/L (ref 38–126)
Anion gap: 18 — ABNORMAL HIGH (ref 5–15)
BUN: 24 mg/dL — ABNORMAL HIGH (ref 6–20)
CO2: 22 mmol/L (ref 22–32)
Calcium: 8.9 mg/dL (ref 8.9–10.3)
Chloride: 93 mmol/L — ABNORMAL LOW (ref 98–111)
Creatinine, Ser: 2.31 mg/dL — ABNORMAL HIGH (ref 0.44–1.00)
GFR, Estimated: 26 mL/min — ABNORMAL LOW (ref >60)
Glucose, Bld: 76 mg/dL (ref 70–99)
Potassium: 4 mmol/L (ref 3.5–5.1)
Sodium: 133 mmol/L — ABNORMAL LOW (ref 135–145)
Total Bilirubin: 0.6 mg/dL (ref 0.3–1.2)
Total Protein: 6.3 g/dL — ABNORMAL LOW (ref 6.5–8.1)

## 2021-11-08 LAB — CBC
HCT: 29.3 % — ABNORMAL LOW (ref 36.0–46.0)
Hemoglobin: 10.1 g/dL — ABNORMAL LOW (ref 12.0–15.0)
MCH: 31.7 pg (ref 26.0–34.0)
MCHC: 34.5 g/dL (ref 30.0–36.0)
MCV: 91.8 fL (ref 80.0–100.0)
Platelets: 169 10*3/uL (ref 150–400)
RBC: 3.19 MIL/uL — ABNORMAL LOW (ref 3.87–5.11)
RDW: 14.9 % (ref 11.5–15.5)
WBC: 4.6 10*3/uL (ref 4.0–10.5)
nRBC: 0 % (ref 0.0–0.2)

## 2021-11-08 LAB — LIPASE, BLOOD: Lipase: 59 U/L — ABNORMAL HIGH (ref 11–51)

## 2021-11-08 LAB — PREGNANCY, URINE: Preg Test, Ur: NEGATIVE

## 2021-11-08 MED ORDER — LACTATED RINGERS IV BOLUS
1000.0000 mL | Freq: Once | INTRAVENOUS | Status: AC
  Administered 2021-11-08: 1000 mL via INTRAVENOUS

## 2021-11-08 MED ORDER — PANTOPRAZOLE SODIUM 40 MG IV SOLR
40.0000 mg | Freq: Once | INTRAVENOUS | Status: AC
  Administered 2021-11-09: 40 mg via INTRAVENOUS
  Filled 2021-11-08: qty 10

## 2021-11-08 MED ORDER — LACTATED RINGERS IV BOLUS
1000.0000 mL | Freq: Once | INTRAVENOUS | Status: AC
  Administered 2021-11-09: 1000 mL via INTRAVENOUS

## 2021-11-08 MED ORDER — ONDANSETRON HCL 4 MG/2ML IJ SOLN
4.0000 mg | Freq: Once | INTRAMUSCULAR | Status: AC
  Administered 2021-11-09: 4 mg via INTRAVENOUS
  Filled 2021-11-08: qty 2

## 2021-11-08 NOTE — Progress Notes (Signed)
Patient is here for a PPD placement.  PPD placed in left forearm @ 4:00 pm.  Patient will return 11/11/2021 at 9:10 to have PPD read. Patient went to ED earlier today for acid reflux symptom management. She LWBS due to long wait times. Scheduled with Dr. Melba Coon to discuss medications for acid reflux.    Kristin Prude, RN

## 2021-11-08 NOTE — ED Provider Notes (Signed)
  [facility] EMERGENCY DEPT Provider Note   CSN: [id number] Arrival date & time: 11/08/21  1927     History Chief Complaint  Patient presents with  . Emesis  . Gastroesophageal Reflux    HPI Kristin [name] is a 46 y.o. female presenting for ***.   Patient's recorded medical, surgical, social, medication list and allergies were reviewed in the Snapshot window as part of the initial history.   Review of Systems   Review of Systems  Physical Exam Updated Vital Signs BP (!) 88/63   Pulse 81   Temp 98.4 F (36.9 C) (Oral)   Resp 18   Ht 5\' 4"  (1.626 m)   Wt 61.2 kg   SpO2 99%   BMI 23.17 kg/m  Physical Exam   ED Course/ Medical Decision Making/ A&P    Procedures Procedures   Medications Ordered in ED Medications - No data to display  Medical Decision Making:    Kristin [name] is a 46 y.o. female who presented to the ED today with *** detailed above.     {crccomplexity:27900}  Complete initial physical exam performed, notably the patient  was ***.      Reviewed and confirmed nursing documentation for past medical history, family history, social history.    Initial Assessment:   With the patient's presentation of ***, most likely diagnosis is ***. Other diagnoses were considered including (but not limited to) ***. These are considered less likely due to history of present illness and physical exam findings.   {crccopa:27899}  Initial Plan:  ***  ***Screening labs including CBC and Metabolic panel to evaluate for infectious or metabolic etiology of disease.  ***Urinalysis with reflex culture ordered to evaluate for UTI or relevant urologic/nephrologic pathology.  ***CXR to evaluate for structural/infectious intrathoracic pathology.  ***EKG to evaluate for cardiac pathology. Objective evaluation as below reviewed with plan for close reassessment  Initial Study Results:   Laboratory  All laboratory results reviewed without evidence of  clinically relevant pathology.   ***Exceptions include: ***   ***EKG EKG was reviewed independently. Rate, rhythm, axis, intervals all examined and without medically relevant abnormality. ST segments without concerns for elevations.    Radiology  All images reviewed independently. ***Agree with radiology report at this time.   No results found.   Consults:  Case discussed with ***.   Final Assessment and Plan:   ***   ***  Clinical Impression: No diagnosis found.   Data Unavailable   Final Clinical Impression(s) / ED Diagnoses Final diagnoses:  None    Rx / DC Orders ED Discharge Orders     None

## 2021-11-08 NOTE — ED Notes (Signed)
Pt name called multiple times for triage, no response.  

## 2021-11-08 NOTE — ED Triage Notes (Signed)
Patient arrives to traige with complaints of worsening vomiting due to acid reflux. Patient states that she has been unable to keep any food down for the past 2 weeks.   Rates pain a 10/10 (generalized body pain).

## 2021-11-08 NOTE — ED Notes (Signed)
Pt name called for triage, no response 

## 2021-11-09 MED ORDER — FAMOTIDINE 20 MG PO TABS
20.0000 mg | ORAL_TABLET | Freq: Once | ORAL | Status: AC
  Administered 2021-11-09: 20 mg via ORAL
  Filled 2021-11-09: qty 1

## 2021-11-09 MED ORDER — ONDANSETRON HCL 4 MG PO TABS: 4.0000 mg | ORAL_TABLET | Freq: Three times a day (TID) | ORAL | 0 refills | Status: DC | PRN

## 2021-11-09 MED ORDER — PANTOPRAZOLE SODIUM 40 MG PO TBEC: 40.0000 mg | DELAYED_RELEASE_TABLET | Freq: Every day | ORAL | 0 refills | Status: DC

## 2021-11-09 NOTE — Discharge Instructions (Addendum)
You were seen today for nonspecific abdominal pain, poor p.o. intake and ongoing heartburn.  We discussed your symptoms and this is likely related to your known gastroesophageal reflux and emesis.  You appeared dehydrated on labs today and have been provided with 2 L of IV fluids.  We have discussed the importance of close outpatient follow-up with your PCP ideally within 48 hours due to your slight renal dysfunction initially detected on labs. After administration of the IV fluids and medications, you should be able to tolerate p.o. intake. It is critical that you take the following medicines: Pantoprazole, Zofran.  Please call the attached number to get in with your primary care provider.

## 2021-11-09 NOTE — ED Notes (Signed)
Pt denies any active nausea and states GERD symptoms trigger n/v -- this nurse at bedside with Dr. Judd Lien while provider reviewing d/c recommendations -- pt not receptive to recommendations from provider or this nurse- not making eye contact with provider and rolling her eyes in different directions -- provider and this nurse have encouraged pt to f/u with pcp and GI regarding chronic ongoing GERD -  pt provided verbal and written instructions

## 2021-11-10 NOTE — Progress Notes (Deleted)
    SUBJECTIVE:   CHIEF COMPLAINT / HPI:   Abdominal Pain, Nausea Kristin [name] is a 46 y.o. female who presents to the clinic for concerns of continued nausea, abdominal pain, ***. She was seen in the ED on 8/29 for similar symptoms.  While in the ED she received a two 1-L boluses of LR, IV Zofran, IV Protonix, famotidine.  Imaging was deferred given her history of chronic symptoms and negative imaging in the past.  Lab work notable for Cr 2.31 (baseline 0.9), AST elevated to 76, AG 18, Hgb 10.1, lipase 12, UA with trace leukocytes, 0-5 WBC and RBC, negative for hemoglobin on dipstick. Urine pregnancy test negative. She was given a prescription of pantoprazole and Zofran and encouraged to f/u outpatient with PCP/GI.   Prior to this, she was last seen in our clinic on 8/15 for ongoing abdominal pain.  Per the note, appears she has seen GI in the past and been diagnosed with esophagitis and gastritis.  Last seen by GI in 2022, recommended to have right upper quadrant ultrasound and HIDA scan and CT enterography to rule out IBD, unclear if these were completed or not. She was referred to GI at last office visit by Dr. Yetta Barre.   I am unable to see the previous notes from GI but did see she had an EGD in 09/2017 and biopsies were negative for H. Pylori.  Last CT abdomen pelvis was in March 2022 which was concerning for possible small bowel infection or IBD  PPD  Had PPD testing on 8/29  PERTINENT  PMH / PSH: Migraine, hypertension, GERD, bipolar 1, GAD, chronic abdominal pain, alcohol use disorder  OBJECTIVE:   There were no vitals taken for this visit. ***  General: NAD, pleasant, able to participate in exam Cardiac: RRR, no murmurs. Respiratory: CTAB, normal effort, No wheezes, rales or rhonchi Abdomen: Bowel sounds present, nontender, nondistended, no hepatosplenomegaly. Extremities: no edema or cyanosis. Skin: warm and dry, no rashes noted Neuro: alert, no obvious focal deficits Psych:  Normal affect and mood  ASSESSMENT/PLAN:   No problem-specific Assessment & Plan notes found for this encounter.     [name], DO [facility]

## 2021-11-11 ENCOUNTER — Ambulatory Visit: Payer: Medicaid Other | Admitting: Family Medicine

## 2021-11-11 DIAGNOSIS — N179 Acute kidney failure, unspecified: Secondary | ICD-10-CM

## 2021-11-11 DIAGNOSIS — R1084 Generalized abdominal pain: Secondary | ICD-10-CM

## 2021-11-14 NOTE — Progress Notes (Signed)
    SUBJECTIVE:   CHIEF COMPLAINT / HPI:    46 yo female with history of bilateral knee osteoarthritis presents with worsening knee pain. Knee pain has been going for years Describes it as an achy pain which she rates 10/10 Patient reports numbness and tingling sensation that goes down to her feet She endorses taking all 3 of her gabapentin in the morning daily because she usually forget to take the day time dose Reports back pain. No incontinence at this time but reports she's had incontience 2-3 years ago.  Patient has had physical therapy in the past but only went for 2 sessions and had to discontinue due to intolerance and worsening pain. Reports history of steroid injection with no relieve.  TB test Patient request PPD testing for work Had a recent test on 8/29 that was never read. Will repeat one today. Denies any know resent TB exposure. Works as Teacher, English as a foreign language   PERTINENT  PMH / PSH: HTN, chronic bilateral lower pain with bilateral sciatica, bilateral osteoarthritis.  OBJECTIVE:   BP 128/64   Pulse 67   Wt 166 lb (75.3 kg)   SpO2 98%   BMI 28.49 kg/m    Physical Exam General: Alert, well appearing, NAD Cardiovascular: RRR, No Murmurs, Normal S2/S2 Respiratory: CTAB, No wheezing or Rales Extremities:Mild intraarticular edema on bilateral knee  ASSESSMENT/PLAN:   Primary osteoarthritis of both knees Patient with chronic bilateral knee osteoarthritis presenting with worsening bilateral knee pain. On exam have mild edema on both knees without surrounding erythema or tenderness.  Her knees do not appear infected at this time.  -Recommended continue use of Tylenol or ibuprofen for knee pain -Advised patient to apply Voltaren gel over the knees -Discussed possibility of retrying physical PT or intra-articular steroid infection in the future if no improvement.   TB test PPD test administered to patient's left arm by CMA Ms. Saunder.   Patient is instructed to  follow-up on 9/8 at 1030 for her read.  Jerre Simon, MD [facility] [address]

## 2021-11-15 ENCOUNTER — Encounter: Payer: Self-pay | Admitting: Student

## 2021-11-15 ENCOUNTER — Ambulatory Visit (INDEPENDENT_AMBULATORY_CARE_PROVIDER_SITE_OTHER): Payer: Commercial Managed Care - HMO | Admitting: Student

## 2021-11-15 VITALS — BP 128/64 | HR 67 | Wt 166.0 lb

## 2021-11-15 DIAGNOSIS — Z111 Encounter for screening for respiratory tuberculosis: Secondary | ICD-10-CM

## 2021-11-15 DIAGNOSIS — M170 Bilateral primary osteoarthritis of knee: Secondary | ICD-10-CM

## 2021-11-15 NOTE — Patient Instructions (Addendum)
It was wonderful to see you today. Thank you for allowing me to be a part of your care. Below is a short summary of what we discussed at your visit today:  Your knee pain is likely due to your osteoarthritis.  I recommend use of over-the-counter Voltaren gel and continue use of Tylenol and ibuprofen interchangeably.  Please take your gabapentin as prescribed which is 3 times daily.  Your PPD testing today follow-up on 9/8 for your readings  Please bring all of your medications to every appointment!  If you have any questions or concerns, please do not hesitate to contact us via phone or MyChart message.   Jerre Simon, MD [name]

## 2021-11-15 NOTE — Progress Notes (Signed)
Patient here today for PPD test.  Test was given in left forearm. Pt tolerated well. Pt given an appt for reading on 9/8 at 10:30 am. Pt understood. Sunday Spillers, CMA d

## 2021-11-15 NOTE — Assessment & Plan Note (Addendum)
Patient with chronic bilateral knee osteoarthritis presenting with worsening bilateral knee pain. On exam have mild edema on both knees without surrounding erythema or tenderness.  Her knees do not appear infected at this time.  -Recommended continue use of Tylenol or ibuprofen for knee pain -Advised patient to apply Voltaren gel over the knees -Discussed possibility of retrying physical PT or intra-articular steroid infection in the future if no improvement.

## 2021-11-18 ENCOUNTER — Ambulatory Visit (INDEPENDENT_AMBULATORY_CARE_PROVIDER_SITE_OTHER): Payer: 59

## 2021-11-18 DIAGNOSIS — Z111 Encounter for screening for respiratory tuberculosis: Secondary | ICD-10-CM

## 2021-11-18 LAB — TB SKIN TEST
Induration: 0 mm
TB Skin Test: NEGATIVE

## 2021-11-18 NOTE — Progress Notes (Signed)
Patient is here for a PPD read.  It was placed on 11/15/2021 in the left forearm @ 2:45 pm.    PPD RESULTS:  Result: negative Induration: 0 mm  Letter created and given to patient for documentation purposes. Veronda Prude, RN

## 2021-11-18 NOTE — Addendum Note (Signed)
Addended by: Georges Lynch T on: 11/18/2021 11:14 AM   Modules accepted: Orders

## 2021-11-25 ENCOUNTER — Encounter
Payer: Commercial Managed Care - HMO | Attending: Physical Medicine and Rehabilitation | Admitting: Physical Medicine and Rehabilitation

## 2021-11-25 ENCOUNTER — Ambulatory Visit (INDEPENDENT_AMBULATORY_CARE_PROVIDER_SITE_OTHER): Payer: Commercial Managed Care - HMO | Admitting: Student

## 2021-11-25 ENCOUNTER — Other Ambulatory Visit (HOSPITAL_COMMUNITY)
Admission: RE | Admit: 2021-11-25 | Discharge: 2021-11-25 | Disposition: A | Discharge status: Home / Self Care | Payer: Commercial Managed Care - HMO | Source: Ambulatory Visit | Attending: Family Medicine | Admitting: Family Medicine

## 2021-11-25 VITALS — BP 136/80 | HR 92 | Wt 160.2 lb

## 2021-11-25 DIAGNOSIS — N898 Other specified noninflammatory disorders of vagina: Secondary | ICD-10-CM | POA: Diagnosis not present

## 2021-11-25 DIAGNOSIS — M48062 Spinal stenosis, lumbar region with neurogenic claudication: Secondary | ICD-10-CM | POA: Diagnosis not present

## 2021-11-25 DIAGNOSIS — M170 Bilateral primary osteoarthritis of knee: Secondary | ICD-10-CM | POA: Diagnosis not present

## 2021-11-25 LAB — POCT WET PREP (WET MOUNT)
Clue Cells Wet Prep Whiff POC: NEGATIVE
Trichomonas Wet Prep HPF POC: ABSENT
WBC, Wet Prep HPF POC: >20

## 2021-11-25 NOTE — Assessment & Plan Note (Signed)
Patient given contact information for sports medicine clinic who she previously saw for her osteoarthritis and would like to see again

## 2021-11-25 NOTE — Patient Instructions (Signed)
It was great to see you! Thank you for allowing me to participate in your care!  I recommend that you always bring your medications to each appointment as this makes it easy to ensure you are on the correct medications and helps [place] not miss when refills are needed.  Our plans for today:  - for your back pain, please follow with neuro surgery. If you need a new referral placed, let us know:  [address]   [address]   [facility] [place], [place] [address]   [phone] (Work)   [phone] (Fax)    - for your knee pain, you can see sport medicine again if you would like: [address] [place], [place] [address] Phone: [phone]   Take care and seek immediate care sooner if you develop any concerns.   Dr. Erick Alley, DO [facility]

## 2021-11-25 NOTE — Progress Notes (Signed)
    SUBJECTIVE:   CHIEF COMPLAINT / HPI:   Vaginal irritation Pt reports mild vaginal irritation for past 2-3 days ago. She denies increased discharge, burning, or foul odor. She does admit to changing soaps (thinks it is cerave) recently which she does use to clean her vaginal area.  She would like to be tested for gonorrhea and chlamydia while she is getting swab for wet prep although she has low concern for STDs.  She was negative for HIV and syphilis in 2021.   Bilateral knee OA Pt complains of chronic bilateral knee pain due to her osteoarthritis.  Was last seen by Dr. Elliot Gurney on 11/15/2021 advised her to use Tylenol and ibuprofen and return for steroid injections if she would like.  Patient has also been seen by sports medicine clinic who advised patient to return as needed.  Patient would like to return to sports medicine.  Back pain Patient complains of chronic back pain.  Of note she does have spinal stenosis of the lumbar region and was previously referred to neurosurgery in May 2023.  Patient does not recall being contacted by neurosurgery.  OBJECTIVE:   BP 136/80   Pulse 92   Wt 160 lb 3.2 oz (72.7 kg)   SpO2 99%   BMI 27.50 kg/m    General: NAD, pleasant, able to participate in exam Cardiac: Well perfused Respiratory: Breathing comfortably on room air Extremities: no edema or cyanosis. MSK: Mild diffuse tenderness with palpation of bilateral knees, decreased muscle strength and ROM of BLEs due to pain GU: Chaperoned by CMA, normal external female genitalia with no lesions, moist pink vaginal mucosa, normal-appearing cervix with normal amount of clear/pale yellow discharge and no cervical motion tenderness Skin: warm and dry, no rashes noted Neuro: alert, no obvious focal deficits Psych: Normal affect and mood  ASSESSMENT/PLAN:   Vaginal irritation Wet prep was negative for yeast BV and trichomonas.  Advised patient that her irritation could be due to cleansing her  vaginal area with soap.  Advised patient to avoid doing this and to use only water for cleansing the area.  If irritation persists or worsens or other symptoms develop, she should return for further evaluation  Primary osteoarthritis of both knees Patient given contact information for sports medicine clinic who she previously saw for her osteoarthritis and would like to see again  Spinal stenosis of lumbar region with neurogenic claudication Patient provided with contact information for neurosurgery for she was previously referred to  Dr. Erick Alley, DO [facility]

## 2021-11-25 NOTE — Assessment & Plan Note (Signed)
Wet prep was negative for yeast BV and trichomonas.  Advised patient that her irritation could be due to cleansing her vaginal area with soap.  Advised patient to avoid doing this and to use only water for cleansing the area.  If irritation persists or worsens or other symptoms develop, she should return for further evaluation

## 2021-11-25 NOTE — Assessment & Plan Note (Signed)
Patient provided with contact information for neurosurgery for she was previously referred to

## 2021-11-28 ENCOUNTER — Telehealth: Payer: Self-pay | Admitting: *Deleted

## 2021-11-28 NOTE — Telephone Encounter (Signed)
Patient called back and states that she is still having vaginal itching and discharge.  It has increased since her last visit and she would like to get a diflucan for a yeast infection.  Will forward to both the provider who saw her and also the pcp.  Javonne Louissaint,CMA

## 2021-11-29 LAB — CERVICOVAGINAL ANCILLARY ONLY
Chlamydia: NEGATIVE
Comment: NEGATIVE
Comment: NORMAL
Neisseria Gonorrhea: NEGATIVE

## 2021-11-30 ENCOUNTER — Encounter: Payer: Self-pay | Admitting: Student

## 2021-11-30 MED ORDER — FLUCONAZOLE 150 MG PO TABS: 150.0000 mg | ORAL_TABLET | Freq: Once | ORAL | 0 refills | Status: AC

## 2021-11-30 NOTE — Telephone Encounter (Signed)
Patient informed.  Weylin Plagge,CMA  

## 2021-12-05 ENCOUNTER — Encounter: Payer: Self-pay | Admitting: Family Medicine

## 2021-12-05 ENCOUNTER — Ambulatory Visit (INDEPENDENT_AMBULATORY_CARE_PROVIDER_SITE_OTHER): Payer: Commercial Managed Care - HMO | Admitting: Family Medicine

## 2021-12-05 ENCOUNTER — Other Ambulatory Visit: Payer: Self-pay | Admitting: Family Medicine

## 2021-12-05 VITALS — BP 101/76 | HR 97 | Ht 66.0 in | Wt 158.0 lb

## 2021-12-05 DIAGNOSIS — Z1331 Encounter for screening for depression: Secondary | ICD-10-CM | POA: Diagnosis not present

## 2021-12-05 DIAGNOSIS — R112 Nausea with vomiting, unspecified: Secondary | ICD-10-CM

## 2021-12-05 DIAGNOSIS — R1084 Generalized abdominal pain: Secondary | ICD-10-CM

## 2021-12-05 MED ORDER — DICYCLOMINE HCL 10 MG PO CAPS
10.0000 mg | ORAL_CAPSULE | Freq: Three times a day (TID) | ORAL | 0 refills | Status: DC
Start: 2021-12-05 — End: 2021-12-07

## 2021-12-05 NOTE — Patient Instructions (Signed)
It was wonderful to see you today.  Please bring ALL of your medications with you to every visit.   Today we talked about:  We are doing lab work today to check your complete blood count, electrolytes, liver function, kidney function. We will get you scheduled for a right upper quadrant ultrasound. I will send you a MyChart message if you have MyChart. Otherwise, I will give you a call for abnormal results or send a letter if everything returned back normal. If you don't hear from me in 2 weeks, please call the office.    I have also sent another referral to a gastroenterologist for further work up.  I would recommend against Goody Powder and Ibuprofen, especially if you are unable to eat much.    Thank you for choosing [facility].   Please call [phone] with any questions about today's appointment.  Please be sure to schedule follow up at the front  desk before you leave today.   Sharion Settler, DO PGY-3 Family Medicine

## 2021-12-05 NOTE — Progress Notes (Signed)
SUBJECTIVE:   CHIEF COMPLAINT / HPI: Abdominal Pain, Nausea  Kristin [name] is a 46 y.o. female who presents to the clinic for concerns of continued nausea and abdominal pain.  Patient has 5 years of chronic abdominal pain and nausea. She reports this recent flare has been happening for 2-3 weeks. She reports intermittent diffuse abdominal pain, nausea, vomiting, decreased appetite, and dizziness. She is drinking water and gatorade but has been unable to eat whole meals. She can't recall her last meal. She has non-bilious vomiting with bright red specks of blood. She reports her last bowel movement was Friday without blood. She has tried pepcid, zofran, protonix, baking soda, alleve, tylenol, ibuprofen, and goody's powder without relief. She does not drink alcohol and is smoking 2-3 cigarettes per day. She has not used marijuana in years. She denies fevers or melena. She denies family history of GI disorders.  Relevant history: She was seen in the ED on 8/29 for similar symptoms.  While in the ED she received a two 1-L boluses of LR, IV Zofran, IV Protonix, famotidine.  Imaging was deferred given her history of chronic symptoms and negative imaging in the past.  Lab work notable for Cr 2.31 (baseline 0.9), AST elevated to 76, AG 18, Hgb 10.1, lipase 12, UA with trace leukocytes, 0-5 WBC and RBC, negative for hemoglobin on dipstick. Urine pregnancy test negative. She was given a prescription of pantoprazole and Zofran and encouraged to f/u outpatient with PCP/GI.   Prior to this, she was last seen in our clinic on 8/15 for ongoing abdominal pain.  Per the note, appears she has seen GI in the past and been diagnosed with esophagitis and gastritis.  Last seen by GI in 2022, recommended to have right upper quadrant ultrasound and HIDA scan and CT enterography to rule out IBD, unclear if these were completed or not. She was referred to GI at last office visit by Dr. Yetta Barre.   I am unable to see the  previous notes from GI but did see she had an EGD in 09/2017 and biopsies were negative for H. Pylori.  Last CT abdomen pelvis was in March 2022 which was concerning for possible small bowel infection or IBD.  PERTINENT  PMH / PSH: Migraine, hypertension, GERD, bipolar 1, GAD, chronic abdominal pain, alcohol use disorder  OBJECTIVE:   BP 101/76   Pulse 97   Ht 5\' 6"  (1.676 m)   Wt 158 lb (71.7 kg)   SpO2 100%   BMI 25.50 kg/m   Gen: Patient appears to be in pain and fatigued. Cardiac: Normal rate. Normal S1 and S2. No murmurs. Cap refill 3 seconds. Pulm: Normal work of breathing. No wheezing or crackles. Abd: Bowel sounds present. Moderately tender to palpation diffusely and most prominently in epigastric region but also RUQ. No rebound but some guarding. Unable to fully attempt sign given discomfort.  MSK: Able to get up on exam table unassisted, normal gait      12/05/2021    3:38 PM 11/15/2021    3:11 PM 10/25/2021    3:06 PM  Depression screen PHQ 2/9  Decreased Interest 2 2 2   Down, Depressed, Hopeless 2 0 1  PHQ - 2 Score 4 2 3   Altered sleeping 3 3 3   Tired, decreased energy 3 3 2   Change in appetite 3 3 3   Feeling bad or failure about yourself  1 0 1  Trouble concentrating 2 2 3   Moving slowly or  fidgety/restless 2 3 1   Suicidal thoughts 2 0 0  PHQ-9 Score 20 16 16   Difficult doing work/chores   Extremely dIfficult     ASSESSMENT/PLAN:   Generalized abdominal pain Chronic abdominal pain. Acute flare in last 2-3 weeks. Differential diagnosis includes gallbladder disease, pancreatitis, and IBD/IBS. Also considered cannabinoid hyperemesis but she reports she has not used marijuana in several years. -RUQ ultrasound ordered -Lipase, CMP, and CBC today -GI referral placed -Discussed stopping ibuprofen and goody's powder given increased risk for peptic ulcer disease -Start Bentyl 10mg  as needed for pain up to four times a day   Mood Patient's PHQ9 was positive.  She denied a plan to hurt herself or end her life. Reports that her depressed mood is related to her symptoms that she has been dealing with. She does not to be at risk for self harm at this time. Will continue to monitor her mood as we work up her symptoms.   Kristin [name], Medical Student [place]   I was personally present and performed or re-performed the history, physical exam and medical decision making activities of this service and have verified that the service and findings are accurately documented in the student's note.  Sharion Settler, DO                  12/05/2021, 4:49 PM

## 2021-12-05 NOTE — Progress Notes (Deleted)
    SUBJECTIVE:   CHIEF COMPLAINT / HPI:   Abdominal Pain, Nausea Kristin [name] is a 46 y.o. female who presents to the clinic for concerns of continued nausea, abdominal pain, ***. She was seen in the ED on 8/29 for similar symptoms.  While in the ED she received a two 1-L boluses of LR, IV Zofran, IV Protonix, famotidine.  Imaging was deferred given her history of chronic symptoms and negative imaging in the past.  Lab work notable for Cr 2.31 (baseline 0.9), AST elevated to 76, AG 18, Hgb 10.1, lipase 12, UA with trace leukocytes, 0-5 WBC and RBC, negative for hemoglobin on dipstick. Urine pregnancy test negative. She was given a prescription of pantoprazole and Zofran and encouraged to f/u outpatient with PCP/GI.   Prior to this, she was last seen in our clinic on 8/15 for ongoing abdominal pain.  Per the note, appears she has seen GI in the past and been diagnosed with esophagitis and gastritis.  Last seen by GI in 2022, recommended to have right upper quadrant ultrasound and HIDA scan and CT enterography to rule out IBD, unclear if these were completed or not. She was referred to GI at last office visit by Dr. Ronnald Ramp.   I am unable to see the previous notes from GI but did see she had an EGD in 09/2017 and biopsies were negative for H. Pylori.  Last CT abdomen pelvis was in March 2022 which was concerning for possible small bowel infection or IBD.  Wt Readings from Last 3 Encounters:  12/05/21 158 lb (71.7 kg)  11/25/21 160 lb 3.2 oz (72.7 kg)  11/15/21 166 lb (75.3 kg)    PERTINENT  PMH / PSH: Migraine, hypertension, GERD, bipolar 1, GAD, chronic abdominal pain, alcohol use disorder  OBJECTIVE:   BP 101/76   Pulse 97   Ht 5\' 6"  (1.676 m)   Wt 158 lb (71.7 kg)   SpO2 100%   BMI 25.50 kg/m  ***  General: NAD, pleasant, able to participate in exam Cardiac: RRR, no murmurs. Respiratory: CTAB, normal effort, No wheezes, rales or rhonchi Abdomen: Bowel sounds present, nontender,  nondistended, no hepatosplenomegaly. Extremities: no edema or cyanosis. Skin: warm and dry, no rashes noted Neuro: alert, no obvious focal deficits Psych: Normal affect and mood  ASSESSMENT/PLAN:   No problem-specific Assessment & Plan notes found for this encounter.     Sharion Settler, [place]

## 2021-12-05 NOTE — Assessment & Plan Note (Addendum)
Chronic abdominal pain. Acute flare in last 2-3 weeks. Differential diagnosis includes gallbladder disease, pancreatitis, and IBD/IBS. Also considered cannabinoid hyperemesis but she reports she has not used marijuana in several years. -RUQ ultrasound ordered -Lipase, CMP, and CBC today -GI referral placed -Discussed stopping ibuprofen and goody's powder given increased risk for peptic ulcer disease -Start Bentyl 10mg  as needed for pain up to four times a day

## 2021-12-06 LAB — COMPREHENSIVE METABOLIC PANEL
ALT: 25 IU/L (ref 0–32)
AST: 124 IU/L — ABNORMAL HIGH (ref 0–40)
Albumin/Globulin Ratio: 1.2 (ref 1.2–2.2)
Albumin: 3.5 g/dL — ABNORMAL LOW (ref 3.9–4.9)
Alkaline Phosphatase: 141 IU/L — ABNORMAL HIGH (ref 44–121)
BUN/Creatinine Ratio: 8 — ABNORMAL LOW (ref 9–23)
BUN: 9 mg/dL (ref 6–24)
Bilirubin Total: 0.3 mg/dL (ref 0.0–1.2)
CO2: 19 mmol/L — ABNORMAL LOW (ref 20–29)
Calcium: 9 mg/dL (ref 8.7–10.2)
Chloride: 95 mmol/L — ABNORMAL LOW (ref 96–106)
Creatinine, Ser: 1.18 mg/dL — ABNORMAL HIGH (ref 0.57–1.00)
Globulin, Total: 3 g/dL (ref 1.5–4.5)
Glucose: 80 mg/dL (ref 70–99)
Potassium: 4.1 mmol/L (ref 3.5–5.2)
Sodium: 136 mmol/L (ref 134–144)
Total Protein: 6.5 g/dL (ref 6.0–8.5)
eGFR: 57 mL/min/{1.73_m2} — ABNORMAL LOW (ref >59)

## 2021-12-06 LAB — CBC WITH DIFFERENTIAL/PLATELET
Basophils Absolute: 0 10*3/uL (ref 0.0–0.2)
Basos: 1 %
EOS (ABSOLUTE): 0.1 10*3/uL (ref 0.0–0.4)
Eos: 5 %
Hematocrit: 28.7 % — ABNORMAL LOW (ref 34.0–46.6)
Hemoglobin: 9.5 g/dL — ABNORMAL LOW (ref 11.1–15.9)
Immature Grans (Abs): 0 10*3/uL (ref 0.0–0.1)
Immature Granulocytes: 0 %
Lymphocytes Absolute: 1.1 10*3/uL (ref 0.7–3.1)
Lymphs: 40 %
MCH: 30.9 pg (ref 26.6–33.0)
MCHC: 33.1 g/dL (ref 31.5–35.7)
MCV: 94 fL (ref 79–97)
Monocytes Absolute: 0.5 10*3/uL (ref 0.1–0.9)
Monocytes: 18 %
Neutrophils Absolute: 0.9 10*3/uL — ABNORMAL LOW (ref 1.4–7.0)
Neutrophils: 36 %
Platelets: 123 10*3/uL — ABNORMAL LOW (ref 150–450)
RBC: 3.07 x10E6/uL — ABNORMAL LOW (ref 3.77–5.28)
RDW: 13.6 % (ref 11.7–15.4)
WBC: 2.6 10*3/uL — ABNORMAL LOW (ref 3.4–10.8)

## 2021-12-06 LAB — LIPASE: Lipase: 23 U/L (ref 14–72)

## 2021-12-07 ENCOUNTER — Ambulatory Visit
Admission: RE | Admit: 2021-12-07 | Discharge: 2021-12-07 | Disposition: A | Discharge status: Home / Self Care | Payer: Commercial Managed Care - HMO | Source: Ambulatory Visit | Attending: Family Medicine | Admitting: Family Medicine

## 2021-12-07 ENCOUNTER — Telehealth: Payer: Self-pay | Admitting: Gastroenterology

## 2021-12-07 DIAGNOSIS — R1084 Generalized abdominal pain: Secondary | ICD-10-CM

## 2021-12-07 NOTE — Telephone Encounter (Signed)
Supervising MD 10/25/2021 PM  Hi Dr. Bryan Lemma,    We received a referral for this patient for abdominal pain. Patient had procedure with Dr. Michail Sermon back in 2021. Records are in Epic for review. Please advise on scheduling.       Thank you

## 2021-12-08 NOTE — Telephone Encounter (Signed)
She was seen by Dr. Alessandra Bevels in 2019 (inpatient), and Dr. Michail Sermon as recent as EGD/colonoscopy in 10/2019.  Additionally, appears patient has also been seen by Dr. Collene Mares in 2022, and as recent as 10/27/2021.  If that is the case and she just saw Dr. Collene Mares last month, I do not see a clear reason for transfer.  I recommend sticking with 1 Gastroenterologist to allow for complete work-up.  I respectfully decline transfer to me in favor of continued work-up with Dr. Collene Mares and continuity of care.

## 2021-12-10 ENCOUNTER — Encounter (HOSPITAL_BASED_OUTPATIENT_CLINIC_OR_DEPARTMENT_OTHER): Payer: Self-pay | Admitting: Emergency Medicine

## 2021-12-10 ENCOUNTER — Emergency Department (HOSPITAL_BASED_OUTPATIENT_CLINIC_OR_DEPARTMENT_OTHER)
Admission: EM | Admit: 2021-12-10 | Discharge: 2021-12-11 | Disposition: A | Discharge status: Home / Self Care | Payer: Commercial Managed Care - HMO | Attending: Emergency Medicine | Admitting: Emergency Medicine

## 2021-12-10 DIAGNOSIS — E8889 Other specified metabolic disorders: Secondary | ICD-10-CM

## 2021-12-10 DIAGNOSIS — R112 Nausea with vomiting, unspecified: Secondary | ICD-10-CM | POA: Diagnosis present

## 2021-12-10 DIAGNOSIS — F10988 Alcohol use, unspecified with other alcohol-induced disorder: Secondary | ICD-10-CM | POA: Diagnosis not present

## 2021-12-10 DIAGNOSIS — Z79899 Other long term (current) drug therapy: Secondary | ICD-10-CM | POA: Diagnosis not present

## 2021-12-10 DIAGNOSIS — R1084 Generalized abdominal pain: Secondary | ICD-10-CM

## 2021-12-10 DIAGNOSIS — K389 Disease of appendix, unspecified: Secondary | ICD-10-CM

## 2021-12-10 LAB — COMPREHENSIVE METABOLIC PANEL
ALT: 29 U/L (ref 0–44)
AST: 171 U/L — ABNORMAL HIGH (ref 15–41)
Albumin: 3.8 g/dL (ref 3.5–5.0)
Alkaline Phosphatase: 124 U/L (ref 38–126)
Anion gap: 23 — ABNORMAL HIGH (ref 5–15)
BUN: 16 mg/dL (ref 6–20)
CO2: 20 mmol/L — ABNORMAL LOW (ref 22–32)
Calcium: 9.5 mg/dL (ref 8.9–10.3)
Chloride: 94 mmol/L — ABNORMAL LOW (ref 98–111)
Creatinine, Ser: 1.22 mg/dL — ABNORMAL HIGH (ref 0.44–1.00)
GFR, Estimated: 55 mL/min — ABNORMAL LOW (ref >60)
Glucose, Bld: 70 mg/dL (ref 70–99)
Potassium: 3.7 mmol/L (ref 3.5–5.1)
Sodium: 137 mmol/L (ref 135–145)
Total Bilirubin: 0.9 mg/dL (ref 0.3–1.2)
Total Protein: 7 g/dL (ref 6.5–8.1)

## 2021-12-10 LAB — LIPASE, BLOOD: Lipase: 34 U/L (ref 11–51)

## 2021-12-10 LAB — URINALYSIS, ROUTINE W REFLEX MICROSCOPIC
Bilirubin Urine: NEGATIVE
Glucose, UA: NEGATIVE mg/dL
Hgb urine dipstick: NEGATIVE
Leukocytes,Ua: NEGATIVE
Nitrite: NEGATIVE
Specific Gravity, Urine: 1.016 (ref 1.005–1.030)
pH: 5.5 (ref 5.0–8.0)

## 2021-12-10 LAB — RAPID URINE DRUG SCREEN, HOSP PERFORMED
Amphetamines: NOT DETECTED
Barbiturates: NOT DETECTED
Benzodiazepines: NOT DETECTED
Cocaine: POSITIVE — AB
Opiates: NOT DETECTED
Tetrahydrocannabinol: NOT DETECTED

## 2021-12-10 LAB — CBC
HCT: 28.9 % — ABNORMAL LOW (ref 36.0–46.0)
Hemoglobin: 10.2 g/dL — ABNORMAL LOW (ref 12.0–15.0)
MCH: 31.2 pg (ref 26.0–34.0)
MCHC: 35.3 g/dL (ref 30.0–36.0)
MCV: 88.4 fL (ref 80.0–100.0)
Platelets: 209 10*3/uL (ref 150–400)
RBC: 3.27 MIL/uL — ABNORMAL LOW (ref 3.87–5.11)
RDW: 14.2 % (ref 11.5–15.5)
WBC: 3.4 10*3/uL — ABNORMAL LOW (ref 4.0–10.5)
nRBC: 0 % (ref 0.0–0.2)

## 2021-12-10 LAB — ETHANOL: Alcohol, Ethyl (B): 50 mg/dL — ABNORMAL HIGH (ref <10)

## 2021-12-10 LAB — PREGNANCY, URINE: Preg Test, Ur: NEGATIVE

## 2021-12-10 MED ORDER — SODIUM CHLORIDE 0.9 % IV BOLUS
1000.0000 mL | Freq: Once | INTRAVENOUS | Status: AC
  Administered 2021-12-10: 1000 mL via INTRAVENOUS

## 2021-12-10 MED ORDER — DROPERIDOL 2.5 MG/ML IJ SOLN
2.5000 mg | Freq: Once | INTRAMUSCULAR | Status: AC
  Administered 2021-12-10: 2.5 mg via INTRAVENOUS
  Filled 2021-12-10: qty 2

## 2021-12-10 MED ORDER — LIDOCAINE VISCOUS HCL 2 % MT SOLN
15.0000 mL | Freq: Once | OROMUCOSAL | Status: DC
  Filled 2021-12-10: qty 15

## 2021-12-10 MED ORDER — ONDANSETRON HCL 4 MG PO TABS: 4.0000 mg | ORAL_TABLET | Freq: Three times a day (TID) | ORAL | 0 refills | Status: DC | PRN

## 2021-12-10 MED ORDER — ALUM & MAG HYDROXIDE-SIMETH 200-200-20 MG/5ML PO SUSP
30.0000 mL | Freq: Once | ORAL | Status: AC
  Administered 2021-12-10: 30 mL via ORAL
  Filled 2021-12-10: qty 30

## 2021-12-10 MED ORDER — ONDANSETRON HCL 4 MG/2ML IJ SOLN
4.0000 mg | Freq: Once | INTRAMUSCULAR | Status: AC
  Administered 2021-12-10: 4 mg via INTRAVENOUS
  Filled 2021-12-10: qty 2

## 2021-12-10 MED ORDER — FAMOTIDINE IN NACL 20-0.9 MG/50ML-% IV SOLN
20.0000 mg | Freq: Once | INTRAVENOUS | Status: AC
  Administered 2021-12-10: 20 mg via INTRAVENOUS
  Filled 2021-12-10: qty 50

## 2021-12-10 MED ORDER — ONDANSETRON 4 MG PO TBDP
8.0000 mg | ORAL_TABLET | Freq: Once | ORAL | Status: AC
  Administered 2021-12-10: 8 mg via ORAL
  Filled 2021-12-10: qty 2

## 2021-12-10 MED ORDER — PANTOPRAZOLE SODIUM 40 MG PO TBEC: 40.0000 mg | DELAYED_RELEASE_TABLET | Freq: Every day | ORAL | 0 refills | Status: DC

## 2021-12-10 NOTE — ED Provider Triage Note (Signed)
Emergency Medicine Provider Triage Evaluation Note  COURTLAND COPPA , a 46 y.o. female  was evaluated in triage.  Pt complains of recurrent n/v- hx of same. States that she has not seen gi although chart review refutes this claim.  Review of Systems  Positive: vomiting Negative: diarrhea  Physical Exam  BP (!) 141/68 (BP Location: Left Arm)   Pulse 89   Temp 98.1 F (36.7 C)   Resp 16   Ht 5' 4.5" (1.638 m)   Wt 59 kg   SpO2 100%   BMI 21.97 kg/m  Gen:   Awake, no distress   Resp:  Normal effort  MSK:   Moves extremities without difficulty  Other:  Hypertrophy of both cheeks  Medical Decision Making  Medically screening exam initiated at 5:35 PM.  Appropriate orders placed.  TARITA DESHMUKH was informed that the remainder of the evaluation will be completed by another provider, this initial triage assessment does not replace that evaluation, and the importance of remaining in the ED until their evaluation is complete.  Work up initiated.   Margarita Mail, PA-C 12/10/21 1736

## 2021-12-10 NOTE — Discharge Instructions (Addendum)
Take your Protonix as prescribed.  Stop using alcohol, cocaine and marijuana and other drugs.  Avoid NSAIDs, caffeine and spicy foods as well.  Follow-up with your primary doctor as well as her gastroenterologist because you likely need to have repeat endoscopies. Return to the ED with new or worsening symptoms

## 2021-12-10 NOTE — ED Notes (Signed)
Pt vomited right after trying Malox

## 2021-12-10 NOTE — ED Provider Notes (Signed)
[facility] DEPT Provider Note   CSN: [id number] Arrival date & time: 12/10/21  1637     History  Chief Complaint  Patient presents with   Emesis    Kristin [name] is a 46 y.o. female.  Patient with recurrent abdominal pain and nausea and vomiting.  States she was having issues for the past "5 years".  States she been vomiting unable to tolerate p.o. for the past 2 to 3 weeks and she is concerned about being dehydrated.  She vomits multiple times a day with green and yellow emesis and sometimes some red streaks.  Denies any diarrhea and has not had a bowel movement for several days.  Does not think she had a fever.  Continues to drink alcohol on a regular basis but states she does not get shaky if he does not drink.  She saw her PCP for this about 5 days ago and had a gallbladder ultrasound which was normal.  She is referred back to gastroenterology for endoscopies.  Her last endoscopic's were in 2021 which showed gastritis and hemorrhoids.  No ulcers. States she has not used marijuana for many months.  Denies any excessive NSAID use. She still has appendix and gallbladder.  States she feels dizzy and lightheaded and feels like she is dehydrated.  Denies any room spinning dizziness  Patient has had extensive GI work-up in the past as well as endoscopies.  She was also supposed to have a HIDA scan but uncertain if that was done.  Ultrasound 5 days ago showed normal gallbladder.  The history is provided by the patient.  Emesis Associated symptoms: abdominal pain and arthralgias   Associated symptoms: no fever and no myalgias        Home Medications Prior to Admission medications   Medication Sig Start Date End Date Taking? Authorizing Provider  acetaminophen (TYLENOL) 500 MG tablet Take 1 tablet (500 mg total) by mouth every 8 (eight) hours as needed for mild pain. 07/04/19   Sherene Sires, DO  cyclobenzaprine (FLEXERIL) 10 MG tablet Take 1 tablet (10 mg total)  by mouth 3 (three) times daily as needed for muscle spasms. 04/18/21   Cresenzo, Angelyn Punt, MD  dicyclomine (BENTYL) 10 MG capsule TAKE 1 CAPSULE(10 MG) BY MOUTH FOUR TIMES DAILY, BEFORE MEALS AND AT BEDTIME 12/07/21   Alen Bleacher, MD  diphenhydrAMINE (BENADRYL) 25 MG tablet Take 25 mg by mouth every 6 (six) hours as needed for allergies, sleep or itching.    [provider]  famotidine (PEPCID) 20 MG tablet TAKE 1 TABLET(20 MG) BY MOUTH TWICE DAILY 10/27/21   Alen Bleacher, MD  ferrous sulfate 325 (65 FE) MG tablet Take 1 tablet (325 mg total) by mouth daily. Patient not taking: Reported on 12/01/2020 05/31/20 06/30/20  Shary Key, DO  gabapentin (NEURONTIN) 300 MG capsule Take 1 capsule (300 mg total) by mouth 3 (three) times daily. 12/01/20   Mayers, Cari S, PA-C  ibuprofen (ADVIL) 800 MG tablet Take 1 tablet (800 mg total) by mouth every 8 (eight) hours as needed (pain). 10/14/21   Barrett Henle, MD  lisinopril-hydrochlorothiazide (ZESTORETIC) 10-12.5 MG tablet TAKE 1 TABLET BY MOUTH DAILY 10/07/21   Alen Bleacher, MD  lisinopril-hydrochlorothiazide (ZESTORETIC) 20-25 MG tablet Take 1 tablet by mouth daily. 07/12/21   Autry-Lott, Naaman Plummer, DO  OLANZapine (ZYPREXA) 5 MG tablet Take 1 tablet (5 mg total) by mouth at bedtime. Patient not taking: Reported on 10/25/2021 01/07/21   Zola Button, MD  ondansetron (  ZOFRAN) 4 MG tablet Take 1 tablet (4 mg total) by mouth every 8 (eight) hours as needed for nausea or vomiting. Patient not taking: Reported on 12/05/2021 11/09/21   Tretha Sciara, MD  pantoprazole (PROTONIX) 40 MG tablet Take 1 tablet (40 mg total) by mouth daily. Patient not taking: Reported on 12/05/2021 11/09/21 12/09/21  Tretha Sciara, MD  thiamine 100 MG tablet Take 1 tablet (100 mg total) by mouth daily. Patient not taking: Reported on 01/07/2021 05/31/20   Shary Key, DO  traZODone (DESYREL) 50 MG tablet Take 1 tablet (50 mg total) by mouth at bedtime as needed for  sleep. Patient not taking: Reported on 10/25/2021 04/18/21   Concepcion Living, MD  Vitamin D, Ergocalciferol, (DRISDOL) 1.25 MG (50000 UNIT) CAPS capsule Take 1 capsule (50,000 Units total) by mouth every 7 (seven) days. Patient not taking: Reported on 10/25/2021 12/02/20   Mayers, Cari S, PA-C  fluticasone (FLONASE) 50 MCG/ACT nasal spray Place 2 sprays into both nostrils daily. Patient taking differently: Place 2 sprays into both nostrils daily as needed for allergies.  02/25/17 04/14/19  Ok Edwards, PA-C  promethazine (PHENERGAN) 25 MG tablet Take 1 tablet (25 mg total) by mouth every 6 (six) hours as needed for nausea or vomiting. 04/17/18 08/29/18  Isla Pence, MD      Allergies    Sumatriptan, Other, and Eggs or egg-derived products    Review of Systems   Review of Systems  Constitutional:  Positive for activity change and appetite change. Negative for fever.  Gastrointestinal:  Positive for abdominal pain, nausea and vomiting.  Musculoskeletal:  Positive for arthralgias. Negative for myalgias.   all other systems are negative except as noted in the HPI and PMH.    Physical Exam Updated Vital Signs BP (!) 119/104   Pulse 98   Temp 98.1 F (36.7 C)   Resp 20   Ht 5' 4.5" (1.638 m)   Wt 59 kg   SpO2 100%   BMI 21.97 kg/m  Physical Exam Vitals and nursing note reviewed.  Constitutional:      General: She is not in acute distress.    Appearance: She is well-developed. She is not ill-appearing.     Comments: Smells of alcohol  HENT:     Head: Normocephalic and atraumatic.     Mouth/Throat:     Pharynx: No oropharyngeal exudate.  Eyes:     Conjunctiva/sclera: Conjunctivae normal.     Pupils: Pupils are equal, round, and reactive to light.  Neck:     Comments: No meningismus. Cardiovascular:     Rate and Rhythm: Normal rate and regular rhythm.     Heart sounds: Normal heart sounds. No murmur heard. Pulmonary:     Effort: Pulmonary effort is normal. No respiratory distress.      Breath sounds: Normal breath sounds.  Abdominal:     Palpations: Abdomen is soft.     Tenderness: There is abdominal tenderness. There is no guarding or rebound.     Comments: Diffuse abdominal tenderness, worse in the epigastrium.  No lower abdominal tenderness.  No guarding or rebound  Musculoskeletal:        General: No tenderness. Normal range of motion.     Cervical back: Normal range of motion and neck supple.  Skin:    General: Skin is warm.     Capillary Refill: Capillary refill takes less than 2 seconds.  Neurological:     General: No focal deficit present.  Mental Status: She is alert and oriented to person, place, and time. Mental status is at baseline.     Cranial Nerves: No cranial nerve deficit.     Motor: No abnormal muscle tone.     Coordination: Coordination normal.     Comments:  5/5 strength throughout. CN 2-12 intact.Equal grip strength.   Psychiatric:        Behavior: Behavior normal.     ED Results / Procedures / Treatments   Labs (all labs ordered are listed, but only abnormal results are displayed) Labs Reviewed  COMPREHENSIVE METABOLIC PANEL - Abnormal; Notable for the following components:      Result Value   Chloride 94 (*)    CO2 20 (*)    Creatinine, Ser 1.22 (*)    AST 171 (*)    GFR, Estimated 55 (*)    Anion gap 23 (*)    All other components within normal limits  CBC - Abnormal; Notable for the following components:   WBC 3.4 (*)    RBC 3.27 (*)    Hemoglobin 10.2 (*)    HCT 28.9 (*)    All other components within normal limits  URINALYSIS, ROUTINE W REFLEX MICROSCOPIC - Abnormal; Notable for the following components:   Ketones, ur TRACE (*)    Protein, ur TRACE (*)    All other components within normal limits  ETHANOL - Abnormal; Notable for the following components:   Alcohol, Ethyl (B) 50 (*)    All other components within normal limits  RAPID URINE DRUG SCREEN, HOSP PERFORMED - Abnormal; Notable for the following  components:   Cocaine POSITIVE (*)    All other components within normal limits  LIPASE, BLOOD  PREGNANCY, URINE  BASIC METABOLIC PANEL    EKG EKG Interpretation  Date/Time:  Saturday December 10 2021 23:27:39 EDT Ventricular Rate:  86 PR Interval:    QRS Duration: 88 QT Interval:  353 QTC Calculation: 423 R Axis:   76 Text Interpretation: Sinus rhythm Artifact Confirmed by Ezequiel Essex [phone]) on 12/10/2021 11:56:01 PM  Radiology No results found.  Procedures Procedures    Medications Ordered in ED Medications  sodium chloride 0.9 % bolus 1,000 mL (has no administration in time range)  ondansetron (ZOFRAN) injection 4 mg (has no administration in time range)  droperidol (INAPSINE) 2.5 MG/ML injection 2.5 mg (has no administration in time range)  famotidine (PEPCID) IVPB 20 mg premix (has no administration in time range)  alum & mag hydroxide-simeth (MAALOX/MYLANTA) 200-200-20 MG/5ML suspension 30 mL (30 mLs Oral Given 12/10/21 2202)  lidocaine (XYLOCAINE) 2 % viscous mouth solution 15 mL (15 mLs Mouth/Throat Given 12/10/21 2202)  ondansetron (ZOFRAN-ODT) disintegrating tablet 8 mg (8 mg Oral Given 12/10/21 2203)    ED Course/ Medical Decision Making/ A&P                           Medical Decision Making Amount and/or Complexity of Data Reviewed Labs: ordered. Decision-making details documented in ED Course. Radiology: ordered and independent interpretation performed. Decision-making details documented in ED Course. ECG/medicine tests: ordered and independent interpretation performed. Decision-making details documented in ED Course.  Risk Prescription drug management.   Acute on chronic abdominal pain with nausea and vomiting.  History of alcohol and THC abuse.  Vitals are stable, no distress.  Abdomen soft but tender in the epigastrium.  Given hydration and symptom control. UDS positive for cocaine.  Ethanol level is 50.  LFTs  are at baseline.  Anion gap of  23.  Bicarb is 20.  Patient given IV fluids, droperidol and antiemetics.  Likely alcoholic ketosis with gastritis some potential peptic ulcer disease.  Has been noncompliant with medications and continues to drink alcohol and use drugs.  No evidence of life-threatening GI bleed. We will hydrate and recheck chemistry and attempt p.o. challenge and likely will need GI follow-up.  Reinitiate PPI, avoid alcohol, NSAIDs, spicy foods, caffeine, alcohol and cocaine. Follow back up with her PCP as well as gastroenterology.  Care to be transferred at shift change pending recheck chemistry panel after hydration, PO challenge, and acute abdominal series Xray.  Dr. Randal Buba to assume care.         Final Clinical Impression(s) / ED Diagnoses Final diagnoses:  Alcoholic ketosis (HCC)  Nausea and vomiting, unspecified vomiting type    Rx / DC Orders ED Discharge Orders     None         Tanveer Dobberstein, Annie Main, MD 12/11/21 0119

## 2021-12-10 NOTE — ED Triage Notes (Signed)
Vomiting intermittently for 5 years but this episode x 2-3 weeks. Concerned for dehydration.

## 2021-12-11 ENCOUNTER — Emergency Department (HOSPITAL_BASED_OUTPATIENT_CLINIC_OR_DEPARTMENT_OTHER): Payer: Commercial Managed Care - HMO

## 2021-12-11 ENCOUNTER — Emergency Department (HOSPITAL_BASED_OUTPATIENT_CLINIC_OR_DEPARTMENT_OTHER): Payer: Commercial Managed Care - HMO | Admitting: Radiology

## 2021-12-11 LAB — BASIC METABOLIC PANEL
Anion gap: 18 — ABNORMAL HIGH (ref 5–15)
BUN: 14 mg/dL (ref 6–20)
CO2: 20 mmol/L — ABNORMAL LOW (ref 22–32)
Calcium: 8.5 mg/dL — ABNORMAL LOW (ref 8.9–10.3)
Chloride: 99 mmol/L (ref 98–111)
Creatinine, Ser: 1.19 mg/dL — ABNORMAL HIGH (ref 0.44–1.00)
GFR, Estimated: 57 mL/min — ABNORMAL LOW (ref >60)
Glucose, Bld: 83 mg/dL (ref 70–99)
Potassium: 3.6 mmol/L (ref 3.5–5.1)
Sodium: 137 mmol/L (ref 135–145)

## 2021-12-11 MED ORDER — KETOROLAC TROMETHAMINE 30 MG/ML IJ SOLN
30.0000 mg | Freq: Once | INTRAMUSCULAR | Status: AC
  Administered 2021-12-11: 30 mg via INTRAVENOUS
  Filled 2021-12-11: qty 1

## 2021-12-11 MED ORDER — ONDANSETRON HCL 4 MG PO TABS: 4.0000 mg | ORAL_TABLET | Freq: Three times a day (TID) | ORAL | 0 refills | Status: DC | PRN

## 2021-12-11 MED ORDER — PANTOPRAZOLE SODIUM 40 MG PO TBEC: 40.0000 mg | DELAYED_RELEASE_TABLET | Freq: Every day | ORAL | 0 refills | Status: DC

## 2021-12-11 MED ORDER — IOHEXOL 300 MG/ML  SOLN
100.0000 mL | Freq: Once | INTRAMUSCULAR | Status: AC | PRN
  Administered 2021-12-11: 100 mL via INTRAVENOUS

## 2021-12-13 NOTE — Telephone Encounter (Signed)
Patient returned call, I advised her of Dr. Merita Norton recommendations.

## 2021-12-13 NOTE — Telephone Encounter (Signed)
Hi Dr. Bryan Lemma,    I called patient to inform her of the denial, she did not pick up and her voice mail box is full.    Thank you

## 2022-01-05 ENCOUNTER — Emergency Department (HOSPITAL_COMMUNITY): Payer: Commercial Managed Care - HMO

## 2022-01-05 ENCOUNTER — Other Ambulatory Visit: Payer: Self-pay

## 2022-01-05 ENCOUNTER — Inpatient Hospital Stay (HOSPITAL_COMMUNITY)
Admission: EM | Admit: 2022-01-05 | Discharge: 2022-01-12 | DRG: 286 | Disposition: A | Discharge status: Home / Self Care | Payer: Commercial Managed Care - HMO | Attending: Family Medicine | Admitting: Family Medicine

## 2022-01-05 ENCOUNTER — Ambulatory Visit (HOSPITAL_COMMUNITY): Payer: Commercial Managed Care - HMO

## 2022-01-05 ENCOUNTER — Ambulatory Visit (INDEPENDENT_AMBULATORY_CARE_PROVIDER_SITE_OTHER): Payer: Commercial Managed Care - HMO | Admitting: Family Medicine

## 2022-01-05 ENCOUNTER — Encounter: Payer: Self-pay | Admitting: Family Medicine

## 2022-01-05 VITALS — BP 130/80 | HR 131 | Wt 174.0 lb

## 2022-01-05 DIAGNOSIS — J9601 Acute respiratory failure with hypoxia: Secondary | ICD-10-CM | POA: Diagnosis present

## 2022-01-05 DIAGNOSIS — Z1152 Encounter for screening for COVID-19: Secondary | ICD-10-CM

## 2022-01-05 DIAGNOSIS — Z5321 Procedure and treatment not carried out due to patient leaving prior to being seen by health care provider: Secondary | ICD-10-CM | POA: Insufficient documentation

## 2022-01-05 DIAGNOSIS — J9600 Acute respiratory failure, unspecified whether with hypoxia or hypercapnia: Secondary | ICD-10-CM | POA: Diagnosis present

## 2022-01-05 DIAGNOSIS — J189 Pneumonia, unspecified organism: Secondary | ICD-10-CM

## 2022-01-05 DIAGNOSIS — R7989 Other specified abnormal findings of blood chemistry: Secondary | ICD-10-CM | POA: Diagnosis present

## 2022-01-05 DIAGNOSIS — R000 Tachycardia, unspecified: Secondary | ICD-10-CM | POA: Diagnosis not present

## 2022-01-05 DIAGNOSIS — F419 Anxiety disorder, unspecified: Secondary | ICD-10-CM | POA: Diagnosis present

## 2022-01-05 DIAGNOSIS — I5181 Takotsubo syndrome: Secondary | ICD-10-CM | POA: Diagnosis present

## 2022-01-05 DIAGNOSIS — E876 Hypokalemia: Secondary | ICD-10-CM | POA: Diagnosis present

## 2022-01-05 DIAGNOSIS — E872 Acidosis, unspecified: Secondary | ICD-10-CM | POA: Diagnosis present

## 2022-01-05 DIAGNOSIS — F1490 Cocaine use, unspecified, uncomplicated: Secondary | ICD-10-CM | POA: Diagnosis present

## 2022-01-05 DIAGNOSIS — D649 Anemia, unspecified: Secondary | ICD-10-CM

## 2022-01-05 DIAGNOSIS — F1010 Alcohol abuse, uncomplicated: Secondary | ICD-10-CM | POA: Diagnosis present

## 2022-01-05 DIAGNOSIS — D638 Anemia in other chronic diseases classified elsewhere: Secondary | ICD-10-CM | POA: Diagnosis present

## 2022-01-05 DIAGNOSIS — Z818 Family history of other mental and behavioral disorders: Secondary | ICD-10-CM

## 2022-01-05 DIAGNOSIS — I5021 Acute systolic (congestive) heart failure: Secondary | ICD-10-CM | POA: Diagnosis present

## 2022-01-05 DIAGNOSIS — I509 Heart failure, unspecified: Secondary | ICD-10-CM

## 2022-01-05 DIAGNOSIS — F319 Bipolar disorder, unspecified: Secondary | ICD-10-CM | POA: Diagnosis present

## 2022-01-05 DIAGNOSIS — Z79899 Other long term (current) drug therapy: Secondary | ICD-10-CM

## 2022-01-05 DIAGNOSIS — A419 Sepsis, unspecified organism: Secondary | ICD-10-CM | POA: Diagnosis present

## 2022-01-05 DIAGNOSIS — Y900 Blood alcohol level of less than 20 mg/100 ml: Secondary | ICD-10-CM | POA: Diagnosis present

## 2022-01-05 DIAGNOSIS — R5381 Other malaise: Secondary | ICD-10-CM

## 2022-01-05 DIAGNOSIS — R451 Restlessness and agitation: Secondary | ICD-10-CM | POA: Diagnosis present

## 2022-01-05 DIAGNOSIS — R0603 Acute respiratory distress: Secondary | ICD-10-CM

## 2022-01-05 DIAGNOSIS — J121 Respiratory syncytial virus pneumonia: Secondary | ICD-10-CM | POA: Diagnosis present

## 2022-01-05 DIAGNOSIS — R059 Cough, unspecified: Secondary | ICD-10-CM

## 2022-01-05 DIAGNOSIS — I428 Other cardiomyopathies: Secondary | ICD-10-CM | POA: Diagnosis present

## 2022-01-05 DIAGNOSIS — I2489 Other forms of acute ischemic heart disease: Secondary | ICD-10-CM | POA: Diagnosis present

## 2022-01-05 DIAGNOSIS — Z91018 Allergy to other foods: Secondary | ICD-10-CM

## 2022-01-05 DIAGNOSIS — R6520 Severe sepsis without septic shock: Secondary | ICD-10-CM | POA: Diagnosis present

## 2022-01-05 DIAGNOSIS — F17210 Nicotine dependence, cigarettes, uncomplicated: Secondary | ICD-10-CM | POA: Diagnosis present

## 2022-01-05 DIAGNOSIS — Z888 Allergy status to other drugs, medicaments and biological substances status: Secondary | ICD-10-CM

## 2022-01-05 DIAGNOSIS — I110 Hypertensive heart disease with heart failure: Secondary | ICD-10-CM | POA: Diagnosis not present

## 2022-01-05 DIAGNOSIS — Z8249 Family history of ischemic heart disease and other diseases of the circulatory system: Secondary | ICD-10-CM

## 2022-01-05 DIAGNOSIS — F1910 Other psychoactive substance abuse, uncomplicated: Secondary | ICD-10-CM | POA: Insufficient documentation

## 2022-01-05 DIAGNOSIS — K219 Gastro-esophageal reflux disease without esophagitis: Secondary | ICD-10-CM | POA: Diagnosis present

## 2022-01-05 LAB — COMPREHENSIVE METABOLIC PANEL
ALT: 29 U/L (ref 0–44)
AST: 161 U/L — ABNORMAL HIGH (ref 15–41)
Albumin: 2.7 g/dL — ABNORMAL LOW (ref 3.5–5.0)
Alkaline Phosphatase: 127 U/L — ABNORMAL HIGH (ref 38–126)
Anion gap: 18 — ABNORMAL HIGH (ref 5–15)
BUN: 7 mg/dL (ref 6–20)
CO2: 15 mmol/L — ABNORMAL LOW (ref 22–32)
Calcium: 8.2 mg/dL — ABNORMAL LOW (ref 8.9–10.3)
Chloride: 108 mmol/L (ref 98–111)
Creatinine, Ser: 0.79 mg/dL (ref 0.44–1.00)
GFR, Estimated: >60 mL/min (ref >60)
Glucose, Bld: 85 mg/dL (ref 70–99)
Potassium: 3.8 mmol/L (ref 3.5–5.1)
Sodium: 141 mmol/L (ref 135–145)
Total Bilirubin: 0.7 mg/dL (ref 0.3–1.2)
Total Protein: 6.4 g/dL — ABNORMAL LOW (ref 6.5–8.1)

## 2022-01-05 LAB — I-STAT BETA HCG BLOOD, ED (MC, WL, AP ONLY): I-stat hCG, quantitative: <5 m[IU]/mL (ref <5)

## 2022-01-05 LAB — CBC WITH DIFFERENTIAL/PLATELET
Abs Immature Granulocytes: 0.02 10*3/uL (ref 0.00–0.07)
Basophils Absolute: 0.1 10*3/uL (ref 0.0–0.1)
Basophils Relative: 1 %
Eosinophils Absolute: 0 10*3/uL (ref 0.0–0.5)
Eosinophils Relative: 0 %
HCT: 26.4 % — ABNORMAL LOW (ref 36.0–46.0)
Hemoglobin: 9.1 g/dL — ABNORMAL LOW (ref 12.0–15.0)
Immature Granulocytes: 0 %
Lymphocytes Relative: 12 %
Lymphs Abs: 0.8 10*3/uL (ref 0.7–4.0)
MCH: 30 pg (ref 26.0–34.0)
MCHC: 34.5 g/dL (ref 30.0–36.0)
MCV: 87.1 fL (ref 80.0–100.0)
Monocytes Absolute: 0.3 10*3/uL (ref 0.1–1.0)
Monocytes Relative: 5 %
Neutro Abs: 4.9 10*3/uL (ref 1.7–7.7)
Neutrophils Relative %: 82 %
Platelets: 286 10*3/uL (ref 150–400)
RBC: 3.03 MIL/uL — ABNORMAL LOW (ref 3.87–5.11)
RDW: 17.7 % — ABNORMAL HIGH (ref 11.5–15.5)
WBC: 6.1 10*3/uL (ref 4.0–10.5)
nRBC: 0 % (ref 0.0–0.2)

## 2022-01-05 LAB — I-STAT VENOUS BLOOD GAS, ED
Acid-base deficit: 14 mmol/L — ABNORMAL HIGH (ref 0.0–2.0)
Bicarbonate: 10.7 mmol/L — ABNORMAL LOW (ref 20.0–28.0)
Calcium, Ion: 1.07 mmol/L — ABNORMAL LOW (ref 1.15–1.40)
HCT: 28 % — ABNORMAL LOW (ref 36.0–46.0)
Hemoglobin: 9.5 g/dL — ABNORMAL LOW (ref 12.0–15.0)
O2 Saturation: 99 %
Potassium: 3.4 mmol/L — ABNORMAL LOW (ref 3.5–5.1)
Sodium: 141 mmol/L (ref 135–145)
TCO2: 11 mmol/L — ABNORMAL LOW (ref 22–32)
pCO2, Ven: 21.8 mmHg — ABNORMAL LOW (ref 44–60)
pH, Ven: 7.301 (ref 7.25–7.43)
pO2, Ven: 170 mmHg — ABNORMAL HIGH (ref 32–45)

## 2022-01-05 LAB — TROPONIN I (HIGH SENSITIVITY)
Troponin I (High Sensitivity): 555 ng/L (ref <18)
Troponin I (High Sensitivity): 747 ng/L (ref <18)

## 2022-01-05 LAB — BRAIN NATRIURETIC PEPTIDE: B Natriuretic Peptide: 1216.9 pg/mL — ABNORMAL HIGH (ref 0.0–100.0)

## 2022-01-05 LAB — POCT INFLUENZA A/B
Influenza A, POC: NEGATIVE
Influenza B, POC: NEGATIVE

## 2022-01-05 LAB — MAGNESIUM: Magnesium: 1.3 mg/dL — ABNORMAL LOW (ref 1.7–2.4)

## 2022-01-05 MED ORDER — ONDANSETRON 4 MG PO TBDP: 4.0000 mg | ORAL_TABLET | Freq: Once | ORAL | Status: AC

## 2022-01-05 MED ORDER — MAGNESIUM SULFATE 4 GM/100ML IV SOLN
4.0000 g | Freq: Once | INTRAVENOUS | Status: AC
  Administered 2022-01-06: 4 g via INTRAVENOUS
  Filled 2022-01-05 (×2): qty 100

## 2022-01-05 MED ORDER — ONDANSETRON 4 MG PO TBDP
ORAL_TABLET | ORAL | Status: AC
  Administered 2022-01-06: 4 mg via ORAL
  Filled 2022-01-05: qty 1

## 2022-01-05 MED ORDER — BENZONATATE 100 MG PO CAPS: 100.0000 mg | ORAL_CAPSULE | Freq: Two times a day (BID) | ORAL | 0 refills | Status: DC | PRN

## 2022-01-05 MED ORDER — LISINOPRIL-HYDROCHLOROTHIAZIDE 10-12.5 MG PO TABS: 1.0000 | ORAL_TABLET | Freq: Every day | ORAL | 3 refills | Status: DC

## 2022-01-05 MED ORDER — LORAZEPAM 2 MG/ML IJ SOLN
1.0000 mg | Freq: Once | INTRAMUSCULAR | Status: AC
  Administered 2022-01-05: 1 mg via INTRAVENOUS
  Filled 2022-01-05: qty 1

## 2022-01-05 MED ORDER — FUROSEMIDE 10 MG/ML IJ SOLN
60.0000 mg | Freq: Once | INTRAMUSCULAR | Status: AC
  Administered 2022-01-05: 60 mg via INTRAVENOUS
  Filled 2022-01-05: qty 6

## 2022-01-05 NOTE — Progress Notes (Signed)
    SUBJECTIVE:   CHIEF COMPLAINT / HPI:   Shortness of breath with tachycardia and cough - Started 2 days ago with cough and congestion - Feeling short of breath when laying down - Subjective fevers at home with cold chills, no documented fevers due to lack of thermometer - Has recent sick exposure from grandchild - DayQuil and NyQuil take it without improvement - Has been feeling like her heart is racing in the last few days - Also has had leg swelling that started about 1 to 2 days ago  PERTINENT  PMH / PSH: Reviewed  OBJECTIVE:   BP 130/80   Pulse (!) 131   Wt 174 lb (78.9 kg)   SpO2 99%   BMI 29.41 kg/m   Gen: Well-nourished, appears mildly sick but nontoxic CV: Tachycardic, no murmur appreciated,1-2+ pitting edema in BLE Pulm: Mildly increased work of breathing, no focal consolidation or crackles or wheezes upon auscultation    ASSESSMENT/PLAN:   Sinus tachycardia Patient with sinus tachycardia to the 130s that did not improve with rest. Unclear etiology, currently sick with possible URI symptoms but cannot rule out PE given the leg swelling and shortness of breath. Discussed with Dr. Owens Shark attending and feel that patient needs evaluation in the ER, possibly with simple rehydration but also evaluation to see if observation is appropriate.  - Patient instructed to go to the ER - Charge nurse notified of patient being sent over  Respiratory symptoms  shortness of breath Has shortness of breath with laying down, no documented history of heart failure but given shortness of breath with laying down and leg swelling, would want to consider work-up. Also would need to consider possible PE evaluation if appropriate.  - Flu testing in clinic negative - Covid testing sent out - Tessalon Perles sent per patient request   Kristin [name], [place]

## 2022-01-05 NOTE — ED Triage Notes (Signed)
Pt complains of SOB x3 days- worse laying down and with exertion, cough, vomiting (3x today), afebrile, tachy-130's, 93-94% on RA. Child was sick a few days before she started to feel poorly. Notices swelling in ankles 'much worse than normal'. Reports only hx is htn and arthritis.

## 2022-01-05 NOTE — Progress Notes (Signed)
RT called by RN to place patient on BiPAP.  Patient did not tolerate BiPAP.  MD at bedside and is aware.  RT will continue to monitor.

## 2022-01-05 NOTE — ED Notes (Signed)
Patient arrived to room. Wheeled to restroom by NT. Upon arrival back to room patient breathing fast, tripoding, repeating "I can't breath", and diaphoretic. Patient assisted back onto bed, onto the cardiac monitor and placed on a non-rebreather. Patient did not want to stay on the nonrebreather, pulled off. RN attempted to place pt on a [employer], and instruct patient to do deep breathings. IV team order placed after unable to successfully obtain IV access.   Awaiting EDP.

## 2022-01-05 NOTE — ED Notes (Signed)
Patient ambulated to restroom couldn't wait for a NT or RN assistance due needing to have a bowel movement urgently. Patient found naked in restroom, tripoding, diaphoretic, and breathing fast. Patient repeatedly stating "I'm going to pass out and sorry I couldn't make it on time". Patient was assisted back into room by a wheelchair, EDP called for and RT called.

## 2022-01-05 NOTE — ED Provider Triage Note (Signed)
Emergency Medicine Provider Triage Evaluation Note  Kristin [name] , a 46 y.o. female  was evaluated in triage.  Pt complains of shortness of breath and cough.  Seen by family medicine, sent to ED because chest pain with tachycardia in the 130s.  Patient was having lower extremity swelling which is been worsening the last few days.  Endorses cough.  Review of Systems  Per HPI  Physical Exam  BP (!) 143/111   Pulse (!) 136   Temp 98.8 F (37.1 C) (Oral)   Resp 18   Ht 5' 4.5" (1.638 m)   Wt 77.1 kg   SpO2 93%   BMI 28.73 kg/m  Gen:   Awake, no distress   Resp:  Normal effort  MSK:   Moves extremities without difficulty  Other:  Tachycardic with regular rhythm. Edema to LE bilaterally.   Medical Decision Making  Medically screening exam initiated at 5:41 PM.  Appropriate orders placed.  BOB EASTWOOD was informed that the remainder of the evaluation will be completed by another provider, this initial triage assessment does not replace that evaluation, and the importance of remaining in the ED until their evaluation is complete.     Sherrill Raring, PA-C 01/05/22 1743

## 2022-01-05 NOTE — ED Provider Notes (Signed)
[facility] EMERGENCY DEPARTMENT Provider Note   CSN: [id number] Arrival date & time: 01/05/22  1613     History  Chief Complaint  Patient presents with   Shortness of Breath    Kristin [name] is a 46 y.o. female who presents with concern for 3 days of worsening shortness of breath primarily when she lies flat or with exertion.  Also having a dry cough and posttussive emesis.  Afebrile, no chest pain.  Also endorses new bilateral lower extremity swelling.  Per chart review patient was recently admitted to the hospital at [facility] for nausea and vomiting secondary to gastritis/duodenitis.  At that time also lactic acidosis and AKI I personally admitted record 3 hypertension alcohol use disorder, gastritis.  Hemoglobin 10 discharge on 10/19 was 7.9.  HPI     Home Medications Prior to Admission medications   Medication Sig Start Date End Date Taking? Authorizing Provider  acetaminophen (TYLENOL) 500 MG tablet Take 1 tablet (500 mg total) by mouth every 8 (eight) hours as needed for mild pain. 07/04/19  Yes Marthenia Rolling, DO  diphenhydrAMINE (BENADRYL) 25 MG tablet Take 25 mg by mouth every 6 (six) hours as needed for allergies, sleep or itching.   Yes [provider]  famotidine (PEPCID) 20 MG tablet TAKE 1 TABLET(20 MG) BY MOUTH TWICE DAILY Patient taking differently: Take 20 mg by mouth daily as needed for indigestion or heartburn. 10/27/21  Yes Jerre Simon, MD  gabapentin (NEURONTIN) 300 MG capsule Take 1 capsule (300 mg total) by mouth 3 (three) times daily. Patient taking differently: Take 300-600 mg by mouth See admin instructions. Take 2 capsules in the morning and 1 capsule at bedtime 12/01/20  Yes Mayers, Cari S, PA-C  lisinopril-hydrochlorothiazide (ZESTORETIC) 10-12.5 MG tablet Take 1 tablet by mouth daily. 01/05/22  Yes Lilland, Alana, DO  ondansetron (ZOFRAN) 4 MG tablet Take 1 tablet (4 mg total) by mouth every 8 (eight) hours as needed  for nausea or vomiting. 12/11/21  Yes Palumbo, April, MD  pantoprazole (PROTONIX) 40 MG tablet Take 1 tablet (40 mg total) by mouth daily. 12/11/21 01/10/22 Yes Palumbo, April, MD  traZODone (DESYREL) 50 MG tablet Take 1 tablet (50 mg total) by mouth at bedtime as needed for sleep. 04/18/21  Yes Cresenzo, Cyndi Lennert, MD  benzonatate (TESSALON) 100 MG capsule Take 1 capsule (100 mg total) by mouth 2 (two) times daily as needed for cough. Patient not taking: Reported on 01/06/2022 01/05/22   Lilland, Alana, DO  dicyclomine (BENTYL) 10 MG capsule TAKE 1 CAPSULE(10 MG) BY MOUTH FOUR TIMES DAILY, BEFORE MEALS AND AT BEDTIME Patient not taking: Reported on 01/06/2022 12/07/21   Jerre Simon, MD  ferrous sulfate 325 (65 FE) MG tablet Take 1 tablet (325 mg total) by mouth daily. Patient not taking: Reported on 12/01/2020 05/31/20 06/30/20  Cora Collum, DO  naltrexone (DEPADE) 50 MG tablet Take 50 mg by mouth daily. 12/29/21   [provider]  fluticasone (FLONASE) 50 MCG/ACT nasal spray Place 2 sprays into both nostrils daily. Patient taking differently: Place 2 sprays into both nostrils daily as needed for allergies.  02/25/17 04/14/19  Belinda Fisher, PA-C  promethazine (PHENERGAN) 25 MG tablet Take 1 tablet (25 mg total) by mouth every 6 (six) hours as needed for nausea or vomiting. 04/17/18 08/29/18  Jacalyn Lefevre, MD      Allergies    Sumatriptan, Other, and Eggs or egg-derived products    Review of Systems  Review of Systems  Respiratory:  Positive for cough and shortness of breath.   Cardiovascular:  Positive for leg swelling. Negative for chest pain and palpitations.    Physical Exam Updated Vital Signs BP (!) 126/107 (BP Location: Right Arm)   Pulse (!) 140   Temp 97.7 F (36.5 C) (Axillary)   Resp (!) 42   Ht 5' 4.5" (1.638 m)   Wt 77.1 kg   SpO2 100%   BMI 28.73 kg/m  Physical Exam Vitals and nursing note reviewed.  Constitutional:      General: She is in acute distress.      Appearance: She is diaphoretic. She is not toxic-appearing.  HENT:     Head: Normocephalic and atraumatic.     Mouth/Throat:     Mouth: Mucous membranes are moist.     Pharynx: No oropharyngeal exudate or posterior oropharyngeal erythema.  Eyes:     General:        Right eye: No discharge.        Left eye: No discharge.     Extraocular Movements: Extraocular movements intact.     Conjunctiva/sclera: Conjunctivae normal.     Pupils: Pupils are equal, round, and reactive to light.  Cardiovascular:     Rate and Rhythm: Regular rhythm. Tachycardia present.     Pulses: Normal pulses.  Pulmonary:     Effort: Tachypnea, accessory muscle usage and respiratory distress present.     Breath sounds: Examination of the right-middle field reveals rales. Examination of the left-middle field reveals rales. Examination of the right-lower field reveals rales. Examination of the left-lower field reveals rales. Rales present. No wheezing.  Chest:     Chest wall: No mass, tenderness or edema.  Abdominal:     General: Bowel sounds are normal. There is no distension.     Palpations: Abdomen is soft.     Tenderness: There is no abdominal tenderness.  Musculoskeletal:        General: No deformity.     Cervical back: Neck supple.     Right lower leg: No tenderness. Edema present.     Left lower leg: No tenderness. Edema present.  Skin:    General: Skin is warm.     Capillary Refill: Capillary refill takes less than 2 seconds.  Neurological:     General: No focal deficit present.     Mental Status: She is alert and oriented to person, place, and time. Mental status is at baseline.  Psychiatric:        Mood and Affect: Mood is anxious.     ED Results / Procedures / Treatments   Labs (all labs ordered are listed, but only abnormal results are displayed) Labs Reviewed  BRAIN NATRIURETIC PEPTIDE - Abnormal; Notable for the following components:      Result Value   B Natriuretic Peptide 1,216.9 (*)     All other components within normal limits  CBC WITH DIFFERENTIAL/PLATELET - Abnormal; Notable for the following components:   RBC 3.03 (*)    Hemoglobin 9.1 (*)    HCT 26.4 (*)    RDW 17.7 (*)    All other components within normal limits  COMPREHENSIVE METABOLIC PANEL - Abnormal; Notable for the following components:   CO2 15 (*)    Calcium 8.2 (*)    Total Protein 6.4 (*)    Albumin 2.7 (*)    AST 161 (*)    Alkaline Phosphatase 127 (*)    Anion gap 18 (*)  All other components within normal limits  MAGNESIUM - Abnormal; Notable for the following components:   Magnesium 1.3 (*)    All other components within normal limits  I-STAT VENOUS BLOOD GAS, ED - Abnormal; Notable for the following components:   pCO2, Ven 21.8 (*)    pO2, Ven 170 (*)    Bicarbonate 10.7 (*)    TCO2 11 (*)    Acid-base deficit 14.0 (*)    Potassium 3.4 (*)    Calcium, Ion 1.07 (*)    HCT 28.0 (*)    Hemoglobin 9.5 (*)    All other components within normal limits  TROPONIN I (HIGH SENSITIVITY) - Abnormal; Notable for the following components:   Troponin I (High Sensitivity) 555 (*)    All other components within normal limits  TROPONIN I (HIGH SENSITIVITY) - Abnormal; Notable for the following components:   Troponin I (High Sensitivity) 747 (*)    All other components within normal limits  HEPARIN LEVEL (UNFRACTIONATED)  I-STAT BETA HCG BLOOD, ED ([facility], [facility], [facility] ONLY)  TROPONIN I (HIGH SENSITIVITY)    EKG EKG Interpretation  Date/Time:  Thursday January 05 2022 17:38:12 EDT Ventricular Rate:  134 PR Interval:  104 QRS Duration: 70 QT Interval:  380 QTC Calculation: 567 R Axis:   73 Text Interpretation: Sinus tachycardia with short PR Otherwise normal ECG When compared with ECG of 05-Jan-2022 15:42, No significant change was found Reconfirmed by Delora Fuel ([id number]) on 01/05/2022 11:54:50 PM  Radiology CT Angio Chest PE W/Cm &/Or Wo Cm  Result Date: 01/06/2022 CLINICAL DATA:  Acute heart  failure. Query pulmonary embolus. Shortness of breath and cough. Chest pain and tachycardia. Lower extremity swelling. EXAM: CT ANGIOGRAPHY CHEST WITH CONTRAST TECHNIQUE: Multidetector CT imaging of the chest was performed using the standard protocol during bolus administration of intravenous contrast. Multiplanar CT image reconstructions and MIPs were obtained to evaluate the vascular anatomy. RADIATION DOSE REDUCTION: This exam was performed according to the departmental dose-optimization program which includes automated exposure control, adjustment of the mA and/or kV according to patient size and/or use of iterative reconstruction technique. CONTRAST:  274mL OMNIPAQUE IOHEXOL 350 MG/ML SOLN COMPARISON:  10/11/2019 FINDINGS: Cardiovascular: Good opacification of the central and segmental pulmonary arteries. No focal filling defects. No evidence of significant pulmonary embolus. Cardiac enlargement. No pericardial effusions. Reflux of contrast material into the hepatic veins suggest right heart failure. Normal caliber thoracic aorta. Mediastinum/Nodes: Thyroid gland is unremarkable. Esophagus is decompressed. Lymphadenopathy in the mediastinum. Largest lymph nodes measure up to about 10 mm short axis dimension. Similar appearance to previous study. This is likely reactive. Lungs/Pleura: Small bilateral pleural effusions. Patchy nodular perihilar infiltrates throughout both lungs with some interstitial thickening. Appearance suggests edema or multifocal pneumonia. Upper Abdomen: Diffuse fatty infiltration of the liver. No acute process demonstrated in the upper abdomen. Musculoskeletal: No chest wall abnormality. No acute or significant osseous findings. Review of the MIP images confirms the above findings. IMPRESSION: 1. No evidence of significant pulmonary embolus. 2. Cardiac enlargement. 3. Diffuse nodular airspace infiltrates in the lungs with perihilar distribution and scattered interstitial thickening.  Changes may represent edema or multifocal pneumonia. Small bilateral pleural effusions. 4. Prominent hilar and mediastinal lymph nodes, likely reactive. Electronically Signed   By: Lucienne Capers M.D.   On: 01/06/2022 03:52   DG Chest 2 View  Result Date: 01/05/2022 CLINICAL DATA:  Shortness of breath for 3 days. EXAM: CHEST - 2 VIEW COMPARISON:  12/11/2021 FINDINGS: There is bibasilar patchy airspace  disease, new in the interval without pleural effusion. Diffuse interstitial prominence is new. The cardiopericardial silhouette is within normal limits for size. The visualized bony structures of the thorax are unremarkable. IMPRESSION: New bibasilar patchy airspace disease with diffuse interstitial prominence. Imaging features may reflect dependent edema or bibasilar infection. Electronically Signed   By: Kennith Center M.D.   On: 01/05/2022 17:31    Procedures .Critical Care  Performed by: Paris Lore, PA-C Authorized by: Paris Lore, PA-C   Critical care provider statement:    Critical care time (minutes):  45   Critical care was time spent personally by me on the following activities:  Development of treatment plan with patient or surrogate, discussions with consultants, evaluation of patient's response to treatment, examination of patient, obtaining history from patient or surrogate, ordering and performing treatments and interventions, ordering and review of laboratory studies, ordering and review of radiographic studies, pulse oximetry and re-evaluation of patient's condition     Medications Ordered in ED Medications  magnesium sulfate IVPB 4 g 100 mL (4 g Intravenous New Bag/Given 01/06/22 0234)  heparin ADULT infusion 100 units/mL (25000 units/280mL) (1,100 Units/hr Intravenous New Bag/Given 01/06/22 0236)  LORazepam (ATIVAN) injection 1 mg (has no administration in time range)  furosemide (LASIX) injection 60 mg (60 mg Intravenous Given 01/05/22 2319)  LORazepam  (ATIVAN) injection 1 mg (1 mg Intravenous Given 01/05/22 2318)  ondansetron (ZOFRAN-ODT) disintegrating tablet 4 mg (4 mg Oral Given 01/06/22 0031)  aspirin chewable tablet 324 mg (324 mg Oral Given 01/06/22 0030)  nitroGLYCERIN (NITROGLYN) 2 % ointment 1 inch (1 inch Topical Given 01/06/22 0031)  heparin bolus via infusion 4,000 Units (4,000 Units Intravenous Bolus from Bag 01/06/22 0236)  LORazepam (ATIVAN) injection 0.5 mg (0.5 mg Intravenous Given 01/06/22 0237)  iohexol (OMNIPAQUE) 350 MG/ML injection 200 mL (200 mLs Intravenous Contrast Given 01/06/22 0336)    ED Course/ Medical Decision Making/ A&P Clinical Course as of 01/06/22 0358  Thu Jan 05, 2022  2245 Patient respiratory distress at time my arrival to the bedside secondary to pulmonary edema in context of new CHF patient.  Attempted BiPAP, patient refused due to claustrophobia.  Working on IV access at this time. Will administer ativan and reattempt BiPAP [RS]  2303 Cardiology fellow paged by this provider to my phone.  [RS]  2309 Consult to Dr. Melton Alar, cardiology who states she will plan to see the patient in the morning. Per cardiology, doubt ACS as patient's EKG is nonischemic and she is CP free. I appreciate her collaboration in the care of this patient.  [RS]  Fri Jan 06, 2022  0200 I was informed by CT technician that patient was given total of 140 mL of IV contrast for CT angiogram and 2 separate attempts, the initial attempt was stopped due to patient report of pain in the right shoulder, IV is in the proximal right arm.  CT tech was able to flush the IV easily and without pain, draws back with blood.  Second attempt of CTA for PE study without any perfusion of contrast through the arteries.  Concern for possible IV infiltration, patient returned to ED bedside.  IV consult for new line, will request line be placed in the left arm at this time. [RS]  [phone] Consult to family medicine resident who is agreeable to admitting this  patient to their service. Cardiology to follow on the inpatient side. I appreciate their collaboration in the care of this patient.  [RS]  0356 Patient  with significantly improved work of breathing on BiPAP though tachypnea and tachycardia persist.  Patient remains quite anxious will redose Ativan.  Awaiting in person evaluation by admitting service.  Continues to diurese well with significant urine output since administration of Lasix. [RS]    Clinical Course User Index [RS] Genever Hentges, Eugene Gavia, PA-C                           Medical Decision Making 46 year old female presents with shortness of breath and respiratory distress.  Tachycardic to the 130s on intake, oxygen saturation normal on room air at that time.  At time of my arrival to the room patient is in respiratory distress with tachypnea, accessory muscle use, diaphoretic and ashen saturation of 88% on room air.  Placed on nonrebreather and respiratory was called for BiPAP.  Patient with rales in the mid lungs and bases bilaterally, 3+ lower extremity edema, nonpitting.  Respiratory attempted to place BiPAP, patient exceedingly anxious refusing BiPAP.  Unfortunately no IV access at this time.  IV team arrived at the bedside during my evaluation was able to safely obtain an 18-gauge line in the right proximal arm.  Ativan administered as well as first dose of Lasix, will attempt BiPAP again once patient is more relaxed.  Work of breathing improved and patient able to speak more clearly and: With nonrebreather, oxygen saturation 95% at this time on nonrebreather.  Differential diagnosis includes limited to CHF, PE, pleural effusion, pneumothorax, pneumonia, flashpulmonary edema, hypertensive emergency.  Amount and/or Complexity of Data Reviewed Labs: ordered.    Details: CBC without leukocytosis and with anemia with hemoglobin of 9 improved from patient's recent discharge hemoglobin of 7.9 on 10/19.  CMP with anion gap of 18.  Magnesium low  at 1.3.  Troponin elevated to 555, BNP 1200.  VBG with normal pH of 7.3 Radiology: ordered.    Details: PE study with edema but without PE.  Visualized by this provider. Discussion of management or test interpretation with external provider(s): Consult to cardiology as above.   Risk OTC drugs. Prescription drug management. Decision regarding hospitalization.   Patient will certainly require admission to the hospital for her respiratory distress and new onset heart failure of unclear etiology.  Angelika and her family  voiced understanding of her medical evaluation and treatment plan. Each of their questions answered to their expressed satisfaction. They are amenable to plan for admission at this time.  Consult to [facility] medicine service as above.   This chart was dictated using voice recognition software, Dragon. Despite the best efforts of this provider to proofread and correct errors, errors may still occur which can change documentation meaning.  Final Clinical Impression(s) / ED Diagnoses Final diagnoses:  Acute congestive heart failure, unspecified heart failure type (HCC)  Elevated troponin  Respiratory distress    Rx / DC Orders ED Discharge Orders     None         Paris Lore, PA-C 01/06/22 0358    Dione Booze, MD 01/06/22 [phone]

## 2022-01-05 NOTE — Patient Instructions (Signed)
Due to your heart rate I want you to go ahead and go to the ER to be evaluated. We need to make sure that there isn't something going on with either your heart or lungs that would be causing it to go that fast.

## 2022-01-06 ENCOUNTER — Other Ambulatory Visit: Payer: Self-pay

## 2022-01-06 ENCOUNTER — Observation Stay (HOSPITAL_COMMUNITY): Payer: Commercial Managed Care - HMO

## 2022-01-06 ENCOUNTER — Emergency Department (HOSPITAL_COMMUNITY): Payer: Commercial Managed Care - HMO

## 2022-01-06 ENCOUNTER — Encounter (HOSPITAL_COMMUNITY): Payer: Self-pay | Admitting: Family Medicine

## 2022-01-06 DIAGNOSIS — A419 Sepsis, unspecified organism: Secondary | ICD-10-CM

## 2022-01-06 DIAGNOSIS — R451 Restlessness and agitation: Secondary | ICD-10-CM | POA: Diagnosis present

## 2022-01-06 DIAGNOSIS — R5381 Other malaise: Secondary | ICD-10-CM | POA: Diagnosis not present

## 2022-01-06 DIAGNOSIS — J9600 Acute respiratory failure, unspecified whether with hypoxia or hypercapnia: Secondary | ICD-10-CM | POA: Diagnosis present

## 2022-01-06 DIAGNOSIS — Z818 Family history of other mental and behavioral disorders: Secondary | ICD-10-CM | POA: Diagnosis not present

## 2022-01-06 DIAGNOSIS — Z91018 Allergy to other foods: Secondary | ICD-10-CM | POA: Diagnosis not present

## 2022-01-06 DIAGNOSIS — F1010 Alcohol abuse, uncomplicated: Secondary | ICD-10-CM | POA: Diagnosis present

## 2022-01-06 DIAGNOSIS — J121 Respiratory syncytial virus pneumonia: Secondary | ICD-10-CM | POA: Diagnosis present

## 2022-01-06 DIAGNOSIS — I5021 Acute systolic (congestive) heart failure: Secondary | ICD-10-CM

## 2022-01-06 DIAGNOSIS — I2489 Other forms of acute ischemic heart disease: Secondary | ICD-10-CM | POA: Diagnosis present

## 2022-01-06 DIAGNOSIS — R7989 Other specified abnormal findings of blood chemistry: Secondary | ICD-10-CM | POA: Diagnosis not present

## 2022-01-06 DIAGNOSIS — R0603 Acute respiratory distress: Secondary | ICD-10-CM | POA: Diagnosis not present

## 2022-01-06 DIAGNOSIS — Z8249 Family history of ischemic heart disease and other diseases of the circulatory system: Secondary | ICD-10-CM | POA: Diagnosis not present

## 2022-01-06 DIAGNOSIS — M7989 Other specified soft tissue disorders: Secondary | ICD-10-CM | POA: Diagnosis not present

## 2022-01-06 DIAGNOSIS — F319 Bipolar disorder, unspecified: Secondary | ICD-10-CM | POA: Diagnosis present

## 2022-01-06 DIAGNOSIS — E872 Acidosis, unspecified: Secondary | ICD-10-CM | POA: Diagnosis present

## 2022-01-06 DIAGNOSIS — Z888 Allergy status to other drugs, medicaments and biological substances status: Secondary | ICD-10-CM | POA: Diagnosis not present

## 2022-01-06 DIAGNOSIS — E876 Hypokalemia: Secondary | ICD-10-CM | POA: Diagnosis present

## 2022-01-06 DIAGNOSIS — F1910 Other psychoactive substance abuse, uncomplicated: Secondary | ICD-10-CM | POA: Insufficient documentation

## 2022-01-06 DIAGNOSIS — I110 Hypertensive heart disease with heart failure: Secondary | ICD-10-CM | POA: Diagnosis present

## 2022-01-06 DIAGNOSIS — J9601 Acute respiratory failure with hypoxia: Secondary | ICD-10-CM | POA: Diagnosis present

## 2022-01-06 DIAGNOSIS — I428 Other cardiomyopathies: Secondary | ICD-10-CM | POA: Diagnosis present

## 2022-01-06 DIAGNOSIS — Y900 Blood alcohol level of less than 20 mg/100 ml: Secondary | ICD-10-CM | POA: Diagnosis present

## 2022-01-06 DIAGNOSIS — I5181 Takotsubo syndrome: Secondary | ICD-10-CM | POA: Diagnosis present

## 2022-01-06 DIAGNOSIS — Z79899 Other long term (current) drug therapy: Secondary | ICD-10-CM | POA: Diagnosis not present

## 2022-01-06 DIAGNOSIS — I509 Heart failure, unspecified: Secondary | ICD-10-CM | POA: Diagnosis not present

## 2022-01-06 DIAGNOSIS — I361 Nonrheumatic tricuspid (valve) insufficiency: Secondary | ICD-10-CM | POA: Diagnosis not present

## 2022-01-06 DIAGNOSIS — Z1152 Encounter for screening for COVID-19: Secondary | ICD-10-CM | POA: Diagnosis not present

## 2022-01-06 DIAGNOSIS — D638 Anemia in other chronic diseases classified elsewhere: Secondary | ICD-10-CM | POA: Diagnosis present

## 2022-01-06 DIAGNOSIS — K219 Gastro-esophageal reflux disease without esophagitis: Secondary | ICD-10-CM | POA: Diagnosis present

## 2022-01-06 DIAGNOSIS — F419 Anxiety disorder, unspecified: Secondary | ICD-10-CM | POA: Diagnosis present

## 2022-01-06 DIAGNOSIS — D649 Anemia, unspecified: Secondary | ICD-10-CM | POA: Diagnosis not present

## 2022-01-06 DIAGNOSIS — F1490 Cocaine use, unspecified, uncomplicated: Secondary | ICD-10-CM | POA: Diagnosis present

## 2022-01-06 DIAGNOSIS — F17210 Nicotine dependence, cigarettes, uncomplicated: Secondary | ICD-10-CM | POA: Diagnosis present

## 2022-01-06 DIAGNOSIS — I340 Nonrheumatic mitral (valve) insufficiency: Secondary | ICD-10-CM | POA: Diagnosis not present

## 2022-01-06 DIAGNOSIS — R609 Edema, unspecified: Secondary | ICD-10-CM | POA: Diagnosis not present

## 2022-01-06 HISTORY — DX: Sepsis, unspecified organism: A41.9

## 2022-01-06 LAB — LIPID PANEL
Cholesterol: 170 mg/dL (ref 0–200)
HDL: 72 mg/dL (ref >40)
LDL Cholesterol: 80 mg/dL (ref 0–99)
Total CHOL/HDL Ratio: 2.4 RATIO
Triglycerides: 90 mg/dL (ref <150)
VLDL: 18 mg/dL (ref 0–40)

## 2022-01-06 LAB — RAPID URINE DRUG SCREEN, HOSP PERFORMED
Amphetamines: NOT DETECTED
Barbiturates: NOT DETECTED
Benzodiazepines: NOT DETECTED
Cocaine: POSITIVE — AB
Opiates: NOT DETECTED
Tetrahydrocannabinol: NOT DETECTED

## 2022-01-06 LAB — ECHOCARDIOGRAM COMPLETE
AR max vel: 1.64 cm2
AV Area VTI: 1.33 cm2
AV Area mean vel: 1.62 cm2
AV Mean grad: 3 mmHg
AV Peak grad: 5.8 mmHg
Ao pk vel: 1.2 m/s
Area-P 1/2: 8.34 cm2
Calc EF: 28.8 %
Height: 64.5 in
MV M vel: 3.62 m/s
MV Peak grad: 52.5 mmHg
S' Lateral: 3.6 cm
Single Plane A2C EF: 28.4 %
Single Plane A4C EF: 28.4 %
Weight: 2720 oz

## 2022-01-06 LAB — COMPREHENSIVE METABOLIC PANEL
ALT: 29 U/L (ref 0–44)
AST: 124 U/L — ABNORMAL HIGH (ref 15–41)
Albumin: 2.7 g/dL — ABNORMAL LOW (ref 3.5–5.0)
Alkaline Phosphatase: 132 U/L — ABNORMAL HIGH (ref 38–126)
Anion gap: 15 (ref 5–15)
BUN: 8 mg/dL (ref 6–20)
CO2: 21 mmol/L — ABNORMAL LOW (ref 22–32)
Calcium: 8 mg/dL — ABNORMAL LOW (ref 8.9–10.3)
Chloride: 103 mmol/L (ref 98–111)
Creatinine, Ser: 0.93 mg/dL (ref 0.44–1.00)
GFR, Estimated: >60 mL/min (ref >60)
Glucose, Bld: 123 mg/dL — ABNORMAL HIGH (ref 70–99)
Potassium: 3.5 mmol/L (ref 3.5–5.1)
Sodium: 139 mmol/L (ref 135–145)
Total Bilirubin: 0.7 mg/dL (ref 0.3–1.2)
Total Protein: 6.5 g/dL (ref 6.5–8.1)

## 2022-01-06 LAB — NOVEL CORONAVIRUS, NAA: SARS-CoV-2, NAA: NOT DETECTED

## 2022-01-06 LAB — TROPONIN I (HIGH SENSITIVITY)
Troponin I (High Sensitivity): 940 ng/L (ref <18)
Troponin I (High Sensitivity): 946 ng/L (ref <18)
Troponin I (High Sensitivity): 1184 ng/L (ref <18)
Troponin I (High Sensitivity): 1235 ng/L (ref <18)
Troponin I (High Sensitivity): 1295 ng/L (ref <18)
Troponin I (High Sensitivity): 1158 ng/L (ref <18)

## 2022-01-06 LAB — HEPARIN LEVEL (UNFRACTIONATED)
Heparin Unfractionated: 0.37 IU/mL (ref 0.30–0.70)
Heparin Unfractionated: 0.45 IU/mL (ref 0.30–0.70)

## 2022-01-06 LAB — SARS CORONAVIRUS 2 BY RT PCR: SARS Coronavirus 2 by RT PCR: NEGATIVE

## 2022-01-06 LAB — ETHANOL: Alcohol, Ethyl (B): <10 mg/dL (ref <10)

## 2022-01-06 LAB — HIV ANTIBODY (ROUTINE TESTING W REFLEX): HIV Screen 4th Generation wRfx: NONREACTIVE

## 2022-01-06 MED ORDER — SODIUM CHLORIDE 0.9 % IV SOLN
500.0000 mg | INTRAVENOUS | Status: DC
  Administered 2022-01-06 – 2022-01-07 (×2): 500 mg via INTRAVENOUS
  Filled 2022-01-06 (×3): qty 5

## 2022-01-06 MED ORDER — PANTOPRAZOLE SODIUM 40 MG PO TBEC
40.0000 mg | DELAYED_RELEASE_TABLET | Freq: Every day | ORAL | Status: DC
  Filled 2022-01-06: qty 1

## 2022-01-06 MED ORDER — GABAPENTIN 300 MG PO CAPS
300.0000 mg | ORAL_CAPSULE | Freq: Two times a day (BID) | ORAL | Status: DC
  Administered 2022-01-06 – 2022-01-12 (×13): 300 mg via ORAL
  Filled 2022-01-06 (×13): qty 1

## 2022-01-06 MED ORDER — FUROSEMIDE 10 MG/ML IJ SOLN
40.0000 mg | Freq: Two times a day (BID) | INTRAMUSCULAR | Status: DC
  Administered 2022-01-06 – 2022-01-08 (×4): 40 mg via INTRAVENOUS
  Filled 2022-01-06 (×4): qty 4

## 2022-01-06 MED ORDER — LISINOPRIL-HYDROCHLOROTHIAZIDE 10-12.5 MG PO TABS: 1.0000 | ORAL_TABLET | Freq: Every day | ORAL | Status: DC

## 2022-01-06 MED ORDER — NITROGLYCERIN 2 % TD OINT
1.0000 [in_us] | TOPICAL_OINTMENT | Freq: Once | TRANSDERMAL | Status: AC
  Administered 2022-01-06: 1 [in_us] via TOPICAL
  Filled 2022-01-06: qty 1

## 2022-01-06 MED ORDER — ACETAMINOPHEN 325 MG PO TABS
650.0000 mg | ORAL_TABLET | Freq: Four times a day (QID) | ORAL | Status: DC | PRN
  Administered 2022-01-06 – 2022-01-12 (×8): 650 mg via ORAL
  Filled 2022-01-06 (×8): qty 2

## 2022-01-06 MED ORDER — ASPIRIN 81 MG PO CHEW
324.0000 mg | CHEWABLE_TABLET | Freq: Once | ORAL | Status: AC
  Administered 2022-01-06: 324 mg via ORAL
  Filled 2022-01-06: qty 4

## 2022-01-06 MED ORDER — PERFLUTREN LIPID MICROSPHERE
1.0000 mL | INTRAVENOUS | Status: AC | PRN
  Administered 2022-01-06: 2 mL via INTRAVENOUS

## 2022-01-06 MED ORDER — ONDANSETRON 4 MG PO TBDP: 4.0000 mg | ORAL_TABLET | Freq: Three times a day (TID) | ORAL | Status: DC | PRN

## 2022-01-06 MED ORDER — ADULT MULTIVITAMIN W/MINERALS CH
1.0000 | ORAL_TABLET | Freq: Every day | ORAL | Status: DC
  Administered 2022-01-06 – 2022-01-12 (×7): 1 via ORAL
  Filled 2022-01-06 (×8): qty 1

## 2022-01-06 MED ORDER — BENZONATATE 100 MG PO CAPS
100.0000 mg | ORAL_CAPSULE | Freq: Three times a day (TID) | ORAL | Status: DC | PRN
  Administered 2022-01-06 – 2022-01-12 (×10): 100 mg via ORAL
  Filled 2022-01-06 (×10): qty 1

## 2022-01-06 MED ORDER — PANTOPRAZOLE SODIUM 40 MG IV SOLR
40.0000 mg | Freq: Every day | INTRAVENOUS | Status: DC
  Administered 2022-01-06 – 2022-01-09 (×3): 40 mg via INTRAVENOUS
  Filled 2022-01-06 (×4): qty 10

## 2022-01-06 MED ORDER — SODIUM CHLORIDE 0.9 % IV SOLN
1.0000 g | INTRAVENOUS | Status: DC
  Administered 2022-01-06 – 2022-01-08 (×3): 1 g via INTRAVENOUS
  Filled 2022-01-06 (×4): qty 10

## 2022-01-06 MED ORDER — ONDANSETRON HCL 4 MG/2ML IJ SOLN
4.0000 mg | Freq: Three times a day (TID) | INTRAMUSCULAR | Status: DC | PRN
  Administered 2022-01-06 – 2022-01-10 (×3): 4 mg via INTRAVENOUS
  Filled 2022-01-06 (×2): qty 2

## 2022-01-06 MED ORDER — NICOTINE 14 MG/24HR TD PT24
14.0000 mg | MEDICATED_PATCH | Freq: Every day | TRANSDERMAL | Status: DC
  Administered 2022-01-06 – 2022-01-09 (×2): 14 mg via TRANSDERMAL
  Filled 2022-01-06 (×7): qty 1

## 2022-01-06 MED ORDER — POTASSIUM CHLORIDE CRYS ER 20 MEQ PO TBCR
40.0000 meq | EXTENDED_RELEASE_TABLET | Freq: Once | ORAL | Status: AC
  Administered 2022-01-06: 40 meq via ORAL
  Filled 2022-01-06: qty 2

## 2022-01-06 MED ORDER — LISINOPRIL 10 MG PO TABS
10.0000 mg | ORAL_TABLET | Freq: Every day | ORAL | Status: DC
  Filled 2022-01-06 (×2): qty 1

## 2022-01-06 MED ORDER — ONDANSETRON HCL 4 MG/2ML IJ SOLN
INTRAMUSCULAR | Status: AC
  Filled 2022-01-06: qty 2

## 2022-01-06 MED ORDER — LORAZEPAM 2 MG/ML IJ SOLN
0.5000 mg | Freq: Once | INTRAMUSCULAR | Status: AC
  Administered 2022-01-06: 0.5 mg via INTRAVENOUS
  Filled 2022-01-06: qty 1

## 2022-01-06 MED ORDER — LORAZEPAM 2 MG/ML IJ SOLN: 1.0000 mg | Freq: Once | INTRAMUSCULAR | Status: DC

## 2022-01-06 MED ORDER — HYDROCHLOROTHIAZIDE 12.5 MG PO TABS
12.5000 mg | ORAL_TABLET | Freq: Every day | ORAL | Status: DC
  Administered 2022-01-06: 12.5 mg via ORAL
  Filled 2022-01-06 (×2): qty 1

## 2022-01-06 MED ORDER — IOHEXOL 350 MG/ML SOLN
200.0000 mL | Freq: Once | INTRAVENOUS | Status: AC | PRN
  Administered 2022-01-06: 200 mL via INTRAVENOUS

## 2022-01-06 MED ORDER — DM-GUAIFENESIN ER 30-600 MG PO TB12
1.0000 | ORAL_TABLET | Freq: Two times a day (BID) | ORAL | Status: DC
  Administered 2022-01-06 – 2022-01-12 (×12): 1 via ORAL
  Filled 2022-01-06 (×14): qty 1

## 2022-01-06 MED ORDER — HEPARIN (PORCINE) 25000 UT/250ML-% IV SOLN
1100.0000 [IU]/h | INTRAVENOUS | Status: DC
  Administered 2022-01-06: 1100 [IU]/h via INTRAVENOUS
  Filled 2022-01-06 (×4): qty 250

## 2022-01-06 MED ORDER — FOLIC ACID 1 MG PO TABS
1.0000 mg | ORAL_TABLET | Freq: Every day | ORAL | Status: DC
  Administered 2022-01-06 – 2022-01-12 (×7): 1 mg via ORAL
  Filled 2022-01-06 (×7): qty 1

## 2022-01-06 MED ORDER — FAMOTIDINE 20 MG PO TABS
20.0000 mg | ORAL_TABLET | Freq: Two times a day (BID) | ORAL | Status: DC
  Administered 2022-01-06 – 2022-01-12 (×13): 20 mg via ORAL
  Filled 2022-01-06 (×14): qty 1

## 2022-01-06 MED ORDER — THIAMINE MONONITRATE 100 MG PO TABS
100.0000 mg | ORAL_TABLET | Freq: Every day | ORAL | Status: DC
  Administered 2022-01-06 – 2022-01-12 (×7): 100 mg via ORAL
  Filled 2022-01-06 (×8): qty 1

## 2022-01-06 MED ORDER — HEPARIN BOLUS VIA INFUSION
4000.0000 [IU] | Freq: Once | INTRAVENOUS | Status: AC
  Administered 2022-01-06: 4000 [IU] via INTRAVENOUS
  Filled 2022-01-06: qty 4000

## 2022-01-06 NOTE — ED Notes (Signed)
This RN received report from [place], paramedic. This RN questioned why heparin level had not been drawn for patient that was ordered at 1230. Per Randall Hiss he was told by a doctor to leave the infusion as it was. ED pharmacy consulted at this time and per pharmacy not to pause infusion but still draw ordered lab work at this time.

## 2022-01-06 NOTE — Progress Notes (Signed)
Heart Failure Navigator Progress Note  Assessed for Heart & Vascular TOC clinic readiness.  Patient does not meet criteria due to [facility] consulted. .    Mehul Rudin, BSN, RN Heart Failure Nurse Navigator Secure Chat Only   

## 2022-01-06 NOTE — ED Notes (Signed)
ED TO INPATIENT HANDOFF REPORT  ED Nurse Name and Phone #: Ciearra Rufo RN [phone]  S Name/Age/Gender Kristin [name] 46 y.o. female Room/Bed: 022C/022C  Code Status   Code Status: Full Code  Home/SNF/Other Home Patient oriented to: self, place, time, and situation Is this baseline? Yes   Triage Complete: Triage complete  Chief Complaint Acute respiratory failure (HCC) [J96.00] Sepsis with acute hypoxic respiratory failure without septic shock, due to unspecified organism (HCC) [A41.9, R65.20, J96.01]  Triage Note Pt complains of SOB x3 days- worse laying down and with exertion, cough, vomiting (3x today), afebrile, tachy-130's, 93-94% on RA. Child was sick a few days before she started to feel poorly. Notices swelling in ankles 'much worse than normal'. Reports only hx is htn and arthritis.   Allergies Allergies  Allergen Reactions   Sumatriptan Anaphylaxis    High blood pressure and numb   Other     Bananas-stomach cramps   Eggs Or Egg-Derived Products Rash and Other (See Comments)    Stomach cramps - " can't eat them fried"    Level of Care/Admitting Diagnosis ED Disposition     ED Disposition  Admit   Condition  --   Comment  Hospital Area: [facility] [[id number]]  Level of Care: Progressive [102]  Admit to Progressive based on following criteria: RESPIRATORY PROBLEMS hypoxemic/hypercapnic respiratory failure that is responsive to NIPPV (BiPAP) or High Flow Nasal Cannula (6-80 lpm). Frequent assessment/intervention, no > Q2 hrs < Q4 hrs, to maintain oxygenation and pulmonary hygiene.  May admit patient to [name] or [facility] if equivalent level of care is available:: No  Covid Evaluation: Symptomatic Person Under Investigation (PUI) or recent exposure (last 10 days) *Testing Required*  Diagnosis: Acute respiratory failure (HCC) [518.81.ICD-9-CM]  Admitting Physician: Doreene Eland [[id number]]  Attending Physician: Billey Co [[id number]]   Certification:: I certify this patient will need inpatient services for at least 2 midnights  Estimated Length of Stay: 2          B Medical/Surgery History Past Medical History:  Diagnosis Date   Acid reflux    ACL tear 2011   not sure which side   Acute lateral meniscus tear of right knee    Anxiety    Arthritis    Back pain    slipped disc lower back lumbar   Bipolar 1 disorder (HCC)    Depression    Dysfunctional uterine bleeding    Eczema    Hypertension    Insomnia    Migraine headache    Neuromuscular disorder (HCC)    leg cramps   Past Surgical History:  Procedure Laterality Date   BIOPSY  09/19/2017   Procedure: BIOPSY;  Surgeon: Kathi Der, MD;  Location: [facility];  Service: Gastroenterology;;   BIOPSY  10/15/2019   Procedure: BIOPSY;  Surgeon: Charlott Rakes, MD;  Location: [facility];  Service: Endoscopy;;   COLONOSCOPY N/A 10/15/2019   Procedure: COLONOSCOPY;  Surgeon: Charlott Rakes, MD;  Location: [facility];  Service: Endoscopy;  Laterality: N/A;   DILITATION & CURRETTAGE/HYSTROSCOPY WITH HYDROTHERMAL ABLATION N/A 04/04/2017   Procedure: DILATATION & CURETTAGE/HYSTEROSCOPY WITH HYDROTHERMAL ABLATION;  Surgeon: Reva Bores, MD;  Location: [facility];  Service: Gynecology;  Laterality: N/A;   ESOPHAGOGASTRODUODENOSCOPY N/A 10/15/2019   Procedure: ESOPHAGOGASTRODUODENOSCOPY (EGD);  Surgeon: Charlott Rakes, MD;  Location: [facility];  Service: Endoscopy;  Laterality: N/A;   ESOPHAGOGASTRODUODENOSCOPY (EGD) WITH PROPOFOL N/A 09/19/2017   Procedure: ESOPHAGOGASTRODUODENOSCOPY (EGD) WITH PROPOFOL;  Surgeon: Kathi Der, MD;  Location: [facility];  Service: Gastroenterology;  Laterality: N/A;   KNEE ARTHROSCOPY WITH LATERAL MENISECTOMY Right 09/30/2019   Procedure: KNEE ARTHROSCOPY WITH LATERAL MENISECTOMY;  Surgeon: Sheral Apley, MD;  Location: [facility];  Service: Orthopedics;  Laterality: Right;    TUBAL LIGATION  yrs ago     A IV Location/Drains/Wounds Patient Lines/Drains/Airways Status     Active Line/Drains/Airways     Name Placement date Placement time Site Days   Peripheral IV 01/06/22 20 G 1.88" Anterior;Left Forearm 01/06/22  0212  Forearm  less than 1   Peripheral IV 01/06/22 20 G 1.88" Anterior;Right Forearm 01/06/22  0837  Forearm  less than 1            Intake/Output Last 24 hours  Intake/Output Summary (Last 24 hours) at 01/06/2022 1633 Last data filed at 01/06/2022 1218 Gross per 24 hour  Intake 488.87 ml  Output 1000 ml  Net -511.13 ml    Labs/Imaging Results for orders placed or performed during the hospital encounter of 01/05/22 (from the past 48 hour(s))  Troponin I (High Sensitivity)     Status: Abnormal   Collection Time: 01/05/22  5:54 PM  Result Value Ref Range   Troponin I (High Sensitivity) 555 (HH) <18 ng/L    Comment: CRITICAL RESULT CALLED TO, READ BACK BY AND VERIFIED WITH C STRAUGHN,RN 2026 01/05/2022 WBOND (NOTE) Elevated high sensitivity troponin I (hsTnI) values and significant  changes across serial measurements may suggest ACS but many other  chronic and acute conditions are known to elevate hsTnI results.  Refer to the "Links" section for chest pain algorithms and additional  guidance. Performed at [facility], [address] [place], [place] [address]   Brain natriuretic peptide     Status: Abnormal   Collection Time: 01/05/22  5:54 PM  Result Value Ref Range   B Natriuretic Peptide 1,216.9 (H) 0.0 - 100.0 pg/mL    Comment: Performed at [facility], [address], [place], [place] [address]  CBC with Differential     Status: Abnormal   Collection Time: 01/05/22  5:54 PM  Result Value Ref Range   WBC 6.1 4.0 - 10.5 K/uL   RBC 3.03 (L) 3.87 - 5.11 MIL/uL   Hemoglobin 9.1 (L) 12.0 - 15.0 g/dL   HCT 32.4 (L) 40.1 - 02.7 %   MCV 87.1 80.0 - 100.0 fL   MCH 30.0 26.0 - 34.0 pg   MCHC 34.5 30.0 - 36.0 g/dL    RDW 25.3 (H) 66.4 - 15.5 %   Platelets 286 150 - 400 K/uL   nRBC 0.0 0.0 - 0.2 %   Neutrophils Relative % 82 %   Neutro Abs 4.9 1.7 - 7.7 K/uL   Lymphocytes Relative 12 %   Lymphs Abs 0.8 0.7 - 4.0 K/uL   Monocytes Relative 5 %   Monocytes Absolute 0.3 0.1 - 1.0 K/uL   Eosinophils Relative 0 %   Eosinophils Absolute 0.0 0.0 - 0.5 K/uL   Basophils Relative 1 %   Basophils Absolute 0.1 0.0 - 0.1 K/uL   Immature Granulocytes 0 %   Abs Immature Granulocytes 0.02 0.00 - 0.07 K/uL    Comment: Performed at [facility], [address] [place], [place] [address]  Comprehensive metabolic panel     Status: Abnormal   Collection Time: 01/05/22  5:54 PM  Result Value Ref Range   Sodium 141 135 - 145  mmol/L   Potassium 3.8 3.5 - 5.1 mmol/L   Chloride 108 98 - 111 mmol/L   CO2 15 (L) 22 - 32 mmol/L   Glucose, Bld 85 70 - 99 mg/dL    Comment: Glucose reference range applies only to samples taken after fasting for at least 8 hours.   BUN 7 6 - 20 mg/dL   Creatinine, Ser 1.61 0.44 - 1.00 mg/dL   Calcium 8.2 (L) 8.9 - 10.3 mg/dL   Total Protein 6.4 (L) 6.5 - 8.1 g/dL   Albumin 2.7 (L) 3.5 - 5.0 g/dL   AST 096 (H) 15 - 41 U/L   ALT 29 0 - 44 U/L   Alkaline Phosphatase 127 (H) 38 - 126 U/L   Total Bilirubin 0.7 0.3 - 1.2 mg/dL   GFR, Estimated >04 >54 mL/min    Comment: (NOTE) Calculated using the CKD-EPI Creatinine Equation (2021)    Anion gap 18 (H) 5 - 15    Comment: Performed at [facility], [address], [place], [place] [address]  Magnesium     Status: Abnormal   Collection Time: 01/05/22  5:54 PM  Result Value Ref Range   Magnesium 1.3 (L) 1.7 - 2.4 mg/dL    Comment: Performed at [facility], [address] [place], [place] [address]  I-Stat beta hCG blood, ED     Status: None   Collection Time: 01/05/22  6:04 PM  Result Value Ref Range   I-stat hCG, quantitative <5.0 <5 mIU/mL   Comment 3            Comment:   GEST. AGE      CONC.  (mIU/mL)   <=1 WEEK         5 - 50     2 WEEKS       50 - 500     3 WEEKS       100 - 10,000     4 WEEKS     1,000 - 30,000        FEMALE AND NON-PREGNANT FEMALE:     LESS THAN 5 mIU/mL   Troponin I (High Sensitivity)     Status: Abnormal   Collection Time: 01/05/22 10:20 PM  Result Value Ref Range   Troponin I (High Sensitivity) 747 (HH) <18 ng/L    Comment: CRITICAL VALUE NOTED. VALUE IS CONSISTENT WITH PREVIOUSLY REPORTED/CALLED VALUE (NOTE) Elevated high sensitivity troponin I (hsTnI) values and significant  changes across serial measurements may suggest ACS but many other  chronic and acute conditions are known to elevate hsTnI results.  Refer to the "Links" section for chest pain algorithms and additional  guidance. Performed at [facility], [address], [place], [place] [address]   I-Stat venous blood gas, [facility] ED only)     Status: Abnormal   Collection Time: 01/05/22 11:14 PM  Result Value Ref Range   pH, Ven 7.301 7.25 - 7.43   pCO2, Ven 21.8 (L) 44 - 60 mmHg   pO2, Ven 170 (H) 32 - 45 mmHg   Bicarbonate 10.7 (L) 20.0 - 28.0 mmol/L   TCO2 11 (L) 22 - 32 mmol/L   O2 Saturation 99 %   Acid-base deficit 14.0 (H) 0.0 - 2.0 mmol/L   Sodium 141 135 - 145 mmol/L   Potassium 3.4 (L) 3.5 - 5.1 mmol/L   Calcium, Ion 1.07 (L) 1.15 - 1.40 mmol/L   HCT 28.0 (L) 36.0 - 46.0 %  Hemoglobin 9.5 (L) 12.0 - 15.0 g/dL   Sample type VENOUS   Troponin I (High Sensitivity)     Status: Abnormal   Collection Time: 01/06/22  5:00 AM  Result Value Ref Range   Troponin I (High Sensitivity) 946 (HH) <18 ng/L    Comment: CRITICAL VALUE NOTED. VALUE IS CONSISTENT WITH PREVIOUSLY REPORTED/CALLED VALUE (NOTE) Elevated high sensitivity troponin I (hsTnI) values and significant  changes across serial measurements may suggest ACS but many other  chronic and acute conditions are known to elevate hsTnI results.  Refer to the "Links" section for chest pain algorithms and additional  guidance. Performed at [facility], [address] [place], [place] [address]   HIV Antibody (routine testing w rflx)     Status: None   Collection Time: 01/06/22  5:00 AM  Result Value Ref Range   HIV Screen 4th Generation wRfx Non Reactive Non Reactive    Comment: Performed at [facility], [address] [place], [place] [address]  Troponin I (High Sensitivity)     Status: Abnormal   Collection Time: 01/06/22  5:20 AM  Result Value Ref Range   Troponin I (High Sensitivity) 940 (HH) <18 ng/L    Comment: CRITICAL VALUE NOTED. VALUE IS CONSISTENT WITH PREVIOUSLY REPORTED/CALLED VALUE (NOTE) Elevated high sensitivity troponin I (hsTnI) values and significant  changes across serial measurements may suggest ACS but many other  chronic and acute conditions are known to elevate hsTnI results.  Refer to the Links section for chest pain algorithms and additional  guidance. Performed at [facility], [address] [place], [place] [address]   Comprehensive metabolic panel     Status: Abnormal   Collection Time: 01/06/22  5:20 AM  Result Value Ref Range   Sodium 139 135 - 145 mmol/L   Potassium 3.5 3.5 - 5.1 mmol/L   Chloride 103 98 - 111 mmol/L   CO2 21 (L) 22 - 32 mmol/L   Glucose, Bld 123 (H) 70 - 99 mg/dL    Comment: Glucose reference range applies only to samples taken after fasting for at least 8 hours.   BUN 8 6 - 20 mg/dL   Creatinine, Ser 9.14 0.44 - 1.00 mg/dL   Calcium 8.0 (L) 8.9 - 10.3 mg/dL   Total Protein 6.5 6.5 - 8.1 g/dL   Albumin 2.7 (L) 3.5 - 5.0 g/dL   AST 782 (H) 15 - 41 U/L   ALT 29 0 - 44 U/L   Alkaline Phosphatase 132 (H) 38 - 126 U/L   Total Bilirubin 0.7 0.3 - 1.2 mg/dL   GFR, Estimated >95 >62 mL/min    Comment: (NOTE) Calculated using the CKD-EPI Creatinine Equation (2021)    Anion gap 15 5 - 15    Comment: Performed at [facility], [address] [place], [place] [address]  SARS Coronavirus 2 by RT PCR (hospital order, performed in [facility]  hospital lab) *cepheid single result test* Anterior Nasal Swab     Status: None   Collection Time: 01/06/22  5:20 AM   Specimen: Anterior Nasal Swab  Result Value Ref Range   SARS Coronavirus 2 by RT PCR NEGATIVE NEGATIVE    Comment: (NOTE) SARS-CoV-2 target nucleic acids are NOT DETECTED.  The SARS-CoV-2 RNA is generally detectable in upper and lower respiratory specimens during the acute phase of infection. The lowest concentration of SARS-CoV-2 viral copies this assay can detect is 250 copies / mL. A  negative result does not preclude SARS-CoV-2 infection and should not be used as the sole basis for treatment or other patient management decisions.  A negative result may occur with improper specimen collection / handling, submission of specimen other than nasopharyngeal swab, presence of viral mutation(s) within the areas targeted by this assay, and inadequate number of viral copies (<250 copies / mL). A negative result must be combined with clinical observations, patient history, and epidemiological information.  Fact Sheet for Patients:   RoadLapTop.co.za  Fact Sheet for Healthcare Providers: http://kim-miller.com/  This test is not yet approved or  cleared by the [place] FDA and has been authorized for detection and/or diagnosis of SARS-CoV-2 by FDA under an Emergency Use Authorization (EUA).  This EUA will remain in effect (meaning this test can be used) for the duration of the COVID-19 declaration under Section 564(b)(1) of the Act, 21 U.S.C. section 360bbb-3(b)(1), unless the authorization is terminated or revoked sooner.  Performed at [facility], [address] [place], [place] [address]   Rapid urine drug screen (hospital performed)     Status: Abnormal   Collection Time: 01/06/22  7:56 AM  Result Value Ref Range   Opiates NONE DETECTED NONE DETECTED   Cocaine POSITIVE (A) NONE DETECTED   Benzodiazepines  NONE DETECTED NONE DETECTED   Amphetamines NONE DETECTED NONE DETECTED   Tetrahydrocannabinol NONE DETECTED NONE DETECTED   Barbiturates NONE DETECTED NONE DETECTED    Comment: (NOTE) DRUG SCREEN FOR MEDICAL PURPOSES ONLY.  IF CONFIRMATION IS NEEDED FOR ANY PURPOSE, NOTIFY LAB WITHIN 5 DAYS.  LOWEST DETECTABLE LIMITS FOR URINE DRUG SCREEN Drug Class                     Cutoff (ng/mL) Amphetamine and metabolites    1000 Barbiturate and metabolites    200 Benzodiazepine                 200 Opiates and metabolites        300 Cocaine and metabolites        300 THC                            50 Performed at [facility], [address] [place], [place] [address]   Lipid panel     Status: None   Collection Time: 01/06/22  1:49 PM  Result Value Ref Range   Cholesterol 170 0 - 200 mg/dL   Triglycerides 90 <098 mg/dL   HDL 72 >11 mg/dL   Total CHOL/HDL Ratio 2.4 RATIO   VLDL 18 0 - 40 mg/dL   LDL Cholesterol 80 0 - 99 mg/dL    Comment:        Total Cholesterol/HDL:CHD Risk Coronary Heart Disease Risk Table                     Men   Women  1/2 Average Risk   3.4   3.3  Average Risk       5.0   4.4  2 X Average Risk   9.6   7.1  3 X Average Risk  23.4   11.0        Use the calculated Patient Ratio above and the CHD Risk Table to determine the patient's CHD Risk.        ATP III CLASSIFICATION (LDL):  <100     mg/dL   Optimal  914-782  mg/dL   Near or Above                    Optimal  130-159  mg/dL   Borderline  161-096  mg/dL   High  >045     mg/dL   Very High Performed at [facility], [address] [place], [place] [address]   Troponin I (High Sensitivity)     Status: Abnormal   Collection Time: 01/06/22  1:49 PM  Result Value Ref Range   Troponin I (High Sensitivity) 1,184 (HH) <18 ng/L    Comment: CRITICAL VALUE NOTED. VALUE IS CONSISTENT WITH PREVIOUSLY REPORTED/CALLED VALUE (NOTE) Elevated high sensitivity troponin I (hsTnI) values and significant   changes across serial measurements may suggest ACS but many other  chronic and acute conditions are known to elevate hsTnI results.  Refer to the "Links" section for chest pain algorithms and additional  guidance. Performed at [facility], [address], [place], [place] [address]   Heparin level (unfractionated)     Status: None   Collection Time: 01/06/22  3:30 PM  Result Value Ref Range   Heparin Unfractionated 0.37 0.30 - 0.70 IU/mL    Comment: (NOTE) The clinical reportable range upper limit is being lowered to >1.10 to align with the FDA approved guidance for the current laboratory assay.  If heparin results are below expected values, and patient dosage has  been confirmed, suggest follow up testing of antithrombin III levels. Performed at [facility], [address], [place], [place] [address]   Ethanol     Status: None   Collection Time: 01/06/22  3:30 PM  Result Value Ref Range   Alcohol, Ethyl (B) <10 <10 mg/dL    Comment: (NOTE) Lowest detectable limit for serum alcohol is 10 mg/dL.  For medical purposes only. Performed at [facility], [address], [place], [place] [address]    ECHOCARDIOGRAM COMPLETE  Result Date: 01/06/2022    ECHOCARDIOGRAM REPORT   Patient Name:   Kristin [name] Date of Exam: 01/06/2022 Medical Rec #:  [id number]      Height:       64.5 in Accession #:    [id number]     Weight:       170.0 lb Date of Birth:  [birth year]     BSA:          1.836 m Patient Age:    45 years       BP:           117/98 mmHg Patient Gender: F              HR:           130 bpm. Exam Location:  Inpatient Procedure: 2D Echo and Intracardiac Opacification Agent Indications:    CHF  History:        Patient has prior history of Echocardiogram examinations, most                 recent 10/10/2019. Risk Factors:Hypertension.  Sonographer:    Cathie Hoops Referring Phys: [phone] DAVID GLICK  Sonographer Comments: Technically difficult study due to poor echo  windows and echo performed with patient supine and on artificial respirator. Supine. IMPRESSIONS  1. Consider Takotsubo.  2. Left ventricular ejection fraction, by estimation, is 25 to 30%. The left ventricle has severely decreased function. The left ventricle demonstrates regional wall motion abnormalities (see scoring diagram/findings for description). Left ventricular  diastolic parameters are indeterminate.  3. Right ventricular systolic function is normal. The right ventricular size is normal. There is normal pulmonary artery systolic pressure.  4. The mitral valve is normal in structure. No evidence of mitral valve regurgitation. No evidence of mitral stenosis.  5. The aortic valve is tricuspid. Aortic valve regurgitation is not visualized. No aortic stenosis is present.  6. The inferior vena cava is normal in size with greater than 50% respiratory variability, suggesting right atrial pressure of 3 mmHg. Comparison(s): Prior images reviewed side by side. The left ventricular function is worsened. FINDINGS  Left Ventricle: Left ventricular ejection fraction, by estimation, is 25 to 30%. The left ventricle has severely decreased function. The left ventricle demonstrates regional wall motion abnormalities. Definity contrast agent was given IV to delineate the left ventricular endocardial borders. The left ventricular internal cavity size was normal in size. There is no left ventricular hypertrophy. Left ventricular diastolic parameters are indeterminate.  LV Wall Scoring: The mid and distal anterior wall, mid and distal lateral wall, mid and distal anterior septum, entire apex, mid and distal inferior wall, mid anterolateral segment, and mid inferoseptal segment are akinetic. Right Ventricle: The right ventricular size is normal. No increase in right ventricular wall thickness. Right ventricular systolic function is normal. There is normal pulmonary artery systolic pressure. The tricuspid regurgitant velocity is  2.59 m/s, and  with an assumed right atrial pressure of 8 mmHg, the estimated right ventricular systolic pressure is 34.8 mmHg. Left Atrium: Left atrial size was normal in size. Right Atrium: Right atrial size was normal in size. Pericardium: There is no evidence of pericardial effusion. Mitral Valve: The mitral valve is normal in structure. No evidence of mitral valve regurgitation. No evidence of mitral valve stenosis. Tricuspid Valve: The tricuspid valve is normal in structure. Tricuspid valve regurgitation is mild . No evidence of tricuspid stenosis. Aortic Valve: The aortic valve is tricuspid. Aortic valve regurgitation is not visualized. No aortic stenosis is present. Aortic valve mean gradient measures 3.0 mmHg. Aortic valve peak gradient measures 5.8 mmHg. Aortic valve area, by VTI measures 1.33 cm. Pulmonic Valve: The pulmonic valve was normal in structure. Pulmonic valve regurgitation is not visualized. No evidence of pulmonic stenosis. Aorta: The aortic root is normal in size and structure. Venous: The inferior vena cava is normal in size with greater than 50% respiratory variability, suggesting right atrial pressure of 3 mmHg. IAS/Shunts: No atrial level shunt detected by color flow Doppler. Additional Comments: Consider Takotsubo.  LEFT VENTRICLE PLAX 2D LVIDd:         4.80 cm      Diastology LVIDs:         3.60 cm      LV e' medial:    16.40 cm/s LV PW:         0.90 cm      LV E/e' medial:  5.1 LV IVS:        0.90 cm      LV e' lateral:   10.40 cm/s LVOT diam:     2.00 cm      LV E/e' lateral: 8.0 LV SV:         22 LV SV Index:   12 LVOT Area:     3.14 cm  LV Volumes (MOD) LV vol d, MOD A2C: 103.0 ml LV vol d, MOD A4C: 120.0 ml LV vol s, MOD A2C: 73.7 ml LV vol s, MOD A4C: 85.9 ml LV SV MOD A2C:  29.3 ml LV SV MOD A4C:     120.0 ml LV SV MOD BP:      32.3 ml RIGHT VENTRICLE RV Basal diam:  3.80 cm RV S prime:     11.90 cm/s TAPSE (M-mode): 1.5 cm LEFT ATRIUM             Index        RIGHT ATRIUM            Index LA diam:        3.10 cm 1.69 cm/m   RA Area:     11.30 cm LA Vol (A2C):   49.9 ml 27.19 ml/m  RA Volume:   22.10 ml  12.04 ml/m LA Vol (A4C):   51.0 ml 27.78 ml/m LA Biplane Vol: 50.8 ml 27.68 ml/m  AORTIC VALVE                    PULMONIC VALVE AV Area (Vmax):    1.64 cm     PV Vmax:       0.80 m/s AV Area (Vmean):   1.62 cm     PV Peak grad:  2.6 mmHg AV Area (VTI):     1.33 cm AV Vmax:           120.00 cm/s AV Vmean:          74.100 cm/s AV VTI:            0.164 m AV Peak Grad:      5.8 mmHg AV Mean Grad:      3.0 mmHg LVOT Vmax:         62.70 cm/s LVOT Vmean:        38.100 cm/s LVOT VTI:          0.069 m LVOT/AV VTI ratio: 0.42  AORTA Ao Root diam: 3.60 cm MITRAL VALVE               TRICUSPID VALVE MV Area (PHT): 8.34 cm    TR Peak grad:   26.8 mmHg MV Decel Time: 91 msec     TR Vmax:        259.00 cm/s MR Peak grad: 52.5 mmHg MR Vmax:      362.33 cm/s  SHUNTS MV E velocity: 83.60 cm/s  Systemic VTI:  0.07 m MV A velocity: 44.60 cm/s  Systemic Diam: 2.00 cm MV E/A ratio:  1.87 Candee Furbish MD Electronically signed by Candee Furbish MD Signature Date/Time: 01/06/2022/2:47:21 PM    Final    CT Angio Chest PE W/Cm &/Or Wo Cm  Result Date: 01/06/2022 CLINICAL DATA:  Acute heart failure. Query pulmonary embolus. Shortness of breath and cough. Chest pain and tachycardia. Lower extremity swelling. EXAM: CT ANGIOGRAPHY CHEST WITH CONTRAST TECHNIQUE: Multidetector CT imaging of the chest was performed using the standard protocol during bolus administration of intravenous contrast. Multiplanar CT image reconstructions and MIPs were obtained to evaluate the vascular anatomy. RADIATION DOSE REDUCTION: This exam was performed according to the departmental dose-optimization program which includes automated exposure control, adjustment of the mA and/or kV according to patient size and/or use of iterative reconstruction technique. CONTRAST:  271mL OMNIPAQUE IOHEXOL 350 MG/ML SOLN COMPARISON:  10/11/2019  FINDINGS: Cardiovascular: Good opacification of the central and segmental pulmonary arteries. No focal filling defects. No evidence of significant pulmonary embolus. Cardiac enlargement. No pericardial effusions. Reflux of contrast material into the hepatic veins suggest right heart failure. Normal caliber thoracic aorta. Mediastinum/Nodes: Thyroid gland is unremarkable. Esophagus is  decompressed. Lymphadenopathy in the mediastinum. Largest lymph nodes measure up to about 10 mm short axis dimension. Similar appearance to previous study. This is likely reactive. Lungs/Pleura: Small bilateral pleural effusions. Patchy nodular perihilar infiltrates throughout both lungs with some interstitial thickening. Appearance suggests edema or multifocal pneumonia. Upper Abdomen: Diffuse fatty infiltration of the liver. No acute process demonstrated in the upper abdomen. Musculoskeletal: No chest wall abnormality. No acute or significant osseous findings. Review of the MIP images confirms the above findings. IMPRESSION: 1. No evidence of significant pulmonary embolus. 2. Cardiac enlargement. 3. Diffuse nodular airspace infiltrates in the lungs with perihilar distribution and scattered interstitial thickening. Changes may represent edema or multifocal pneumonia. Small bilateral pleural effusions. 4. Prominent hilar and mediastinal lymph nodes, likely reactive. Electronically Signed   By: Burman Nieves M.D.   On: 01/06/2022 03:52   DG Chest 2 View  Result Date: 01/05/2022 CLINICAL DATA:  Shortness of breath for 3 days. EXAM: CHEST - 2 VIEW COMPARISON:  12/11/2021 FINDINGS: There is bibasilar patchy airspace disease, new in the interval without pleural effusion. Diffuse interstitial prominence is new. The cardiopericardial silhouette is within normal limits for size. The visualized bony structures of the thorax are unremarkable. IMPRESSION: New bibasilar patchy airspace disease with diffuse interstitial prominence. Imaging  features may reflect dependent edema or bibasilar infection. Electronically Signed   By: Kennith Center M.D.   On: 01/05/2022 17:31    Pending Labs Unresulted Labs (From admission, onward)     Start     Ordered   01/07/22 0500  CBC  Daily at 5am,   R      01/06/22 0035   01/07/22 0500  Heparin level (unfractionated)  Daily at 5am,   R      01/06/22 0035   01/07/22 0500  Comprehensive metabolic panel  Tomorrow morning,   R        01/06/22 0442   01/07/22 0500  Magnesium  Tomorrow morning,   R        01/06/22 0442   01/06/22 1232  Heparin level (unfractionated)  ONCE - URGENT,   URGENT        01/06/22 1232            Vitals/Pain Today's Vitals   01/06/22 1205 01/06/22 1253 01/06/22 1400 01/06/22 1500  BP:  108/85  103/68  Pulse: (!) 130 (!) 130    Resp: (!) 22 (!) 35  (!) 32  Temp:   98.5 F (36.9 C)   TempSrc:      SpO2: 100% 95%    Weight:      Height:      PainSc:        Isolation Precautions Airborne and Contact precautions  Medications Medications  heparin ADULT infusion 100 units/mL (25000 units/273mL) (1,100 Units/hr Intravenous New Bag/Given 01/06/22 0236)  famotidine (PEPCID) tablet 20 mg (20 mg Oral Given 01/06/22 1129)  gabapentin (NEURONTIN) capsule 300 mg (300 mg Oral Given 01/06/22 1213)  thiamine (VITAMIN B1) tablet 100 mg (100 mg Oral Given 01/06/22 1129)  multivitamin with minerals tablet 1 tablet (1 tablet Oral Given 01/06/22 1125)  folic acid (FOLVITE) tablet 1 mg (1 mg Oral Given 01/06/22 1213)  nicotine (NICODERM CQ - dosed in mg/24 hours) patch 14 mg (14 mg Transdermal Patch Applied 01/06/22 1130)  lisinopril (ZESTRIL) tablet 10 mg (0 mg Oral Hold 01/06/22 1621)    And  hydrochlorothiazide (HYDRODIURIL) tablet 12.5 mg (12.5 mg Oral Given 01/06/22 1124)  ondansetron (ZOFRAN) injection 4  mg ( Intravenous Not Given 01/06/22 0538)  cefTRIAXone (ROCEPHIN) 1 g in sodium chloride 0.9 % 100 mL IVPB (0 g Intravenous Stopped 01/06/22 1033)  azithromycin  (ZITHROMAX) 500 mg in sodium chloride 0.9 % 250 mL IVPB (0 mg Intravenous Stopped 01/06/22 1218)  perflutren lipid microspheres (DEFINITY) IV suspension (2 mLs Intravenous Given 01/06/22 0958)  furosemide (LASIX) injection 40 mg (has no administration in time range)  pantoprazole (PROTONIX) injection 40 mg (40 mg Intravenous Given 01/06/22 1209)  furosemide (LASIX) injection 60 mg (60 mg Intravenous Given 01/05/22 2319)  LORazepam (ATIVAN) injection 1 mg (1 mg Intravenous Given 01/05/22 2318)  ondansetron (ZOFRAN-ODT) disintegrating tablet 4 mg (4 mg Oral Given 01/06/22 0031)  magnesium sulfate IVPB 4 g 100 mL (0 g Intravenous Stopped 01/06/22 0435)  aspirin chewable tablet 324 mg (324 mg Oral Given 01/06/22 0030)  nitroGLYCERIN (NITROGLYN) 2 % ointment 1 inch (1 inch Topical Given 01/06/22 0031)  heparin bolus via infusion 4,000 Units (4,000 Units Intravenous Bolus from Bag 01/06/22 0236)  LORazepam (ATIVAN) injection 0.5 mg (0.5 mg Intravenous Given 01/06/22 0237)  iohexol (OMNIPAQUE) 350 MG/ML injection 200 mL (200 mLs Intravenous Contrast Given 01/06/22 0336)  potassium chloride SA (KLOR-CON M) CR tablet 40 mEq (40 mEq Oral Given 01/06/22 1211)    Mobility walks     Focused Assessments Cardiac Assessment Handoff:    No results found for: "CKTOTAL", "CKMB", "CKMBINDEX", "TROPONINI" Lab Results  Component Value Date   DDIMER 0.61 (H) 10/10/2019   Does the Patient currently have chest pain? No   , Pulmonary Assessment Handoff:  Lung sounds: Bilateral Breath Sounds: Clear, Diminished O2 Device: Room Air      R Recommendations: See Admitting Provider Note  Report given to:   Additional Notes:

## 2022-01-06 NOTE — ED Notes (Signed)
Patient transported to CT 

## 2022-01-06 NOTE — ED Notes (Signed)
RN found patient with BIPAP removed, cardiac monitoring removed, and patient naked. Patient assisted back into bed, gown placed back on and cardiac monitoring reconnected. BIPAP placed on patient again. RN advised patient to keep BIPAP on and explained to her why it is important to have on. Patient verbalized understanding. Significant other at bedside sleeping

## 2022-01-06 NOTE — ED Notes (Signed)
Will give PO meds when respiratory comes back down to help with the bipap

## 2022-01-06 NOTE — H&P (Addendum)
Hospital Admission History and Physical Service Pager: [phone]  Patient name: BIRTHA HATLER Medical record number: [id number] Date of Birth: [birth year] Age: 46 y.o. Gender: female  Primary Care Provider: Jerre Simon, MD Consultants: Cardiology Code Status: Full Preferred Emergency Contact: Rosey Bath (Mother) [phone]  Chief Complaint: Shortness of Breath  Assessment and Plan: LILLYEN SCHOW is a 46 y.o. female with PMHx of alcohol use, tobacco use, HTN, esophagitis, and bipolar disorder presenting with worsening SOB, cough, and BLE swelling, admitted for respiratory distress now improving with BiPAP and diuresis . Differential for patient's dyspnea includes CHF exacerbation (elevated BNP, new oxygen requirements, hypervolemic on exam,imaging concerning for edema vs pneumonia ), PNA (recent sick contact, CTA concerning for multifocal pneumonia, less likely due to normal WBC, afebrile), ACS (uptrending troponins but EKG reassuring), PE (not likely, CTA negative for embolus).   * Acute respiratory distress Improving on BiPAP. Presented with worsening SOB, cough and BLE swelling- noted to be tachypneic, tachycardic but maintaining SpO2 on arrival. Suspect acute new-onset CHF (appears hypervolemic on exam, BNP elevated to 1216.9) vs PNA. Last echo in 09/2019 showed LVEF 55-60%. CTA shows bilateral infiltrates, concerning for edema vs multifocal pneumonia but notably patient without leukocytosis or fever.  - Admit to progressive, FMTS attending Dr. Lum Babe - Continue BiPAP for now - Start CTX and azithromycin for CAP coverage - Cardiac Telemetry - Cardiology consulted, appreciate care and recommendations - Plan to redose Lasix per day team - Strict I/Os, Daily weights - F/u echo - F/u CBC,CMP - Trend trops - F/u COVID  Elevated troponin Troponin 555>747. Likely demand ischemia but unable to r/o ACS. s/p ASA in the ED. Patient denies chest pain currently and EKG without  ischemic changes. -Continue heparin gtt -Cardiology consulted, appreciate recommendations -Trend troponin -Tele monitoring -F/u echo results -Consider repeat EKG  Hypomagnesemia Mg 1.3, repleted on admission.  -F/u Mg -Goal Mg >2  Alcohol abuse Per chart review. Patient denies excess alcohol use-- reports 1 beer daily, more only on special occasions (birthdays, etc).  Denies h/o withdrawal. AST elevated to 161 on admission. -Monitor clinically for signs of withdrawal -Thiamine, folate, multivitamin -am CMP -Check ethanol level, UDS  Chronic anemia Normocytic anemia, likely anemia of chronic disease. Hgb 10-11 at baseline, now 9.1 on admission. Stable. -F/u CBC   FEN/GI: NPO due to BiPAP VTE Prophylaxis: Heparin gtt  Disposition: progressive  History of Present Illness:  KHRISTIE SAK is a 46 y.o. female presenting with shortness of breath.  Patient complains of cough over the past 3-4 days which has been progressively worsening. Also chills, body aches around the same time. Today developed shortness of breath.  Endorses bilateral leg swelling over the past 1 week. Attributes her leg swelling to her arthritis. Shortness of breath is worse when laying flat  Took dayquil today to try to help with her symptoms.  Chest soreness when she coughs sometimes but otherwise no chest pain. Currently endorsing heart racing sensation as well.  In the ED, patient was placed on BiPAP, given ativan for claustrophobia.Trops (555>747) and BNP (1216.9) both elevated.  Was given 1x dose of Lasix 60 mg IV. Cardiology consult placed, who will see patient in the morning. WOB improved w/ BiPAP, remains tachycardic and tachypnic.   Review Of Systems: Per HPI  Pertinent Past Medical History: HTN Alcohol use Bipolar disorder Arthritis Remainder reviewed in history tab.   Pertinent Past Surgical History: Remainder reviewed in history tab.   Pertinent Social History: Tobacco  use: Yes  (1/3PPD) Alcohol use: 1 beer daily, more on special occasions Other Substance use: Denies (marijuana once in the past, none recent) Lives with Mom and daughter  Pertinent Family History: None  Remainder reviewed in history tab.   Important Outpatient Medications: Lisinopril-HCTZ  Depakote (patient states she takes this intermittently, last dose 4 days ago) Flexeril Trazodone Gabapentin Iron Zofran Remainder reviewed in medication history.   Objective: BP (!) 128/97   Pulse (!) 141   Temp 97.7 F (36.5 C) (Axillary)   Resp (!) 35   Ht 5' 4.5" (1.638 m)   Wt 77.1 kg   SpO2 100%   BMI 28.73 kg/m  Exam: General: on BiPAP, NAD Eyes: Anicteric, intact EOM Neck: JVD at the level of the mandible Cardiovascular: Tachycardic, no MRG Vascular: +2 pitting edema of BLE Respiratory: Resp rate low 20s, able to speak in full sentences, diminished at the bases, no wheezing, crackles, ronchi, or rales Gastrointestinal: minimal RUQ tenderness to palpation, no peritoneal signs, no distention or fluid wave, hepatomegaly noted Derm: Warm and dry Neuro: CN I-XII grossly normal, no acute focal deficits Psych: Normal mood and affect.  Labs:  CBC BMET  Recent Labs  Lab 01/05/22 1754 01/05/22 2314  WBC 6.1  --   HGB 9.1* 9.5*  HCT 26.4* 28.0*  PLT 286  --    Recent Labs  Lab 01/05/22 1754 01/05/22 2314  NA 141 141  K 3.8 3.4*  CL 108  --   CO2 15*  --   BUN 7  --   CREATININE 0.79  --   GLUCOSE 85  --   CALCIUM 8.2*  --     AST - 161 ALP 127 BNP- 1216.9 Trops- 555>747> Mg - 1.3 EKG: Sinus tachycardia (148bpm),   Imaging Studies Performed:  CT Angio Chest PE W/Cm &/Or Wo Cm  Result Date: 01/06/2022 CLINICAL DATA:  Acute heart failure. Query pulmonary embolus. Shortness of breath and cough. Chest pain and tachycardia. Lower extremity swelling. EXAM: CT ANGIOGRAPHY CHEST WITH CONTRAST TECHNIQUE: Multidetector CT imaging of the chest was performed using the standard  protocol during bolus administration of intravenous contrast. Multiplanar CT image reconstructions and MIPs were obtained to evaluate the vascular anatomy. RADIATION DOSE REDUCTION: This exam was performed according to the departmental dose-optimization program which includes automated exposure control, adjustment of the mA and/or kV according to patient size and/or use of iterative reconstruction technique. CONTRAST:  246mL OMNIPAQUE IOHEXOL 350 MG/ML SOLN COMPARISON:  10/11/2019 FINDINGS: Cardiovascular: Good opacification of the central and segmental pulmonary arteries. No focal filling defects. No evidence of significant pulmonary embolus. Cardiac enlargement. No pericardial effusions. Reflux of contrast material into the hepatic veins suggest right heart failure. Normal caliber thoracic aorta. Mediastinum/Nodes: Thyroid gland is unremarkable. Esophagus is decompressed. Lymphadenopathy in the mediastinum. Largest lymph nodes measure up to about 10 mm short axis dimension. Similar appearance to previous study. This is likely reactive. Lungs/Pleura: Small bilateral pleural effusions. Patchy nodular perihilar infiltrates throughout both lungs with some interstitial thickening. Appearance suggests edema or multifocal pneumonia. Upper Abdomen: Diffuse fatty infiltration of the liver. No acute process demonstrated in the upper abdomen. Musculoskeletal: No chest wall abnormality. No acute or significant osseous findings. Review of the MIP images confirms the above findings. IMPRESSION: 1. No evidence of significant pulmonary embolus. 2. Cardiac enlargement. 3. Diffuse nodular airspace infiltrates in the lungs with perihilar distribution and scattered interstitial thickening. Changes may represent edema or multifocal pneumonia. Small bilateral pleural effusions. 4. Prominent hilar  and mediastinal lymph nodes, likely reactive. Electronically Signed   By: Burman Nieves M.D.   On: 01/06/2022 03:52   DG Chest 2  View  Result Date: 01/05/2022 CLINICAL DATA:  Shortness of breath for 3 days. EXAM: CHEST - 2 VIEW COMPARISON:  12/11/2021 FINDINGS: There is bibasilar patchy airspace disease, new in the interval without pleural effusion. Diffuse interstitial prominence is new. The cardiopericardial silhouette is within normal limits for size. The visualized bony structures of the thorax are unremarkable. IMPRESSION: New bibasilar patchy airspace disease with diffuse interstitial prominence. Imaging features may reflect dependent edema or bibasilar infection. Electronically Signed   By: Kennith Center M.D.   On: 01/05/2022 17:31      Lorri Frederick, MD 01/06/2022, 5:12 AM PGY-1, [facility]  FPTS Intern pager: [phone], text pages welcome Secure chat group [facility] Teaching Service    FPTS Upper-Level Resident Addendum   I have independently interviewed and examined the patient. I have discussed the above with Dr. Sindy Messing and agree with the documented plan. My edits for correction/addition/clarification are included above. Please see any attending notes.   Maury Dus, MD PGY-3, [facility] 01/06/2022 5:53 AM  FPTS Service pager: [phone] (text pages welcome through [facility])

## 2022-01-06 NOTE — Assessment & Plan Note (Addendum)
New onset HFrEF initially with acute hypoxemic respiratory failure.  EF 25-30% with ?concern for Takotsubo.  Improving with diuresis, no longer requiring oxygen.  CTA negative for PE. - Cardiac Telemetry - Cardiology consulted, appreciate care and recommendations - Continue diuresis: Lasix 40mg  IV BID - Strict I/Os, Daily weights - F/u CBC,CMP

## 2022-01-06 NOTE — ED Notes (Signed)
RT and RN transporting patient to CT, upon entering room pt was attempting to remove her gown, and cardiac leads. RN instructed pt to keep gown and cardiac monitor on, patient verbalized understanding.

## 2022-01-06 NOTE — Assessment & Plan Note (Addendum)
Troponin 555>747. Likely demand ischemia but unable to r/o ACS. s/p ASA in the ED. Patient denies chest pain currently and EKG without ischemic changes. -Continue heparin gtt -Cardiology consulted, appreciate recommendations -Trend troponin -Tele monitoring -F/u echo results -Consider repeat EKG

## 2022-01-06 NOTE — ED Notes (Signed)
Pt  was removed from bipap temporarily to give medications. As I tried to place it back on her she would not allow me to put it back on. I contacted respiratory who stated that if she was breathing appropriately and stayed good in her spo2 and work of breathing it should not be an issue. I will keep a close eye on her room air Sp02

## 2022-01-06 NOTE — Assessment & Plan Note (Addendum)
Has anemia with chronic disease. Hgb 10-11 at baseline, now 8.4 s/p transfusion with 1 unit of RBC. CTAP did not show retroperitoneal bleeding. -Monitor CBC

## 2022-01-06 NOTE — Progress Notes (Signed)
Pt transported to CT scan w/settings as per flowsheet. Pt tol well. Pt returned to ED 22 w/out incidence.

## 2022-01-06 NOTE — ED Notes (Signed)
Per IV team, upon arrival to room found purwick on the floor. RN explained/instructed patient to keep it on. Patient verbalized understand. RN explained to patient again how it works again and why she has it on.

## 2022-01-06 NOTE — Progress Notes (Signed)
ANTICOAGULATION CONSULT NOTE - Initial Consult  Pharmacy Consult for heparin Indication: pulmonary embolus  Allergies  Allergen Reactions   Sumatriptan Anaphylaxis    High blood pressure and numb   Other     Bananas-stomach cramps   Eggs Or Egg-Derived Products Rash and Other (See Comments)    Stomach cramps - " can't eat them fried"    Patient Measurements: Height: 5' 4.5" (163.8 cm) Weight: 77.1 kg (170 lb) IBW/kg (Calculated) : 55.85 Heparin Dosing Weight: 72 kg   Vital Signs: Temp: 98.5 F (36.9 C) (10/27 1400) BP: 103/68 (10/27 1500) Pulse Rate: 130 (10/27 1253)  Labs: Recent Labs    01/05/22 1754 01/05/22 2220 01/05/22 2314 01/06/22 0500 01/06/22 0520 01/06/22 1349 01/06/22 1530  HGB 9.1*  --  9.5*  --   --   --   --   HCT 26.4*  --  28.0*  --   --   --   --   PLT 286  --   --   --   --   --   --   HEPARINUNFRC  --   --   --   --   --   --  0.37  CREATININE 0.79  --   --   --  0.93  --   --   TROPONINIHS 555*   < >  --  946* 940* 1,184*  --    < > = values in this interval not displayed.     Estimated Creatinine Clearance: 76 mL/min (by C-G formula based on SCr of 0.93 mg/dL).   Medical History: Past Medical History:  Diagnosis Date   Acid reflux    ACL tear 2011   not sure which side   Acute lateral meniscus tear of right knee    Anxiety    Arthritis    Back pain    slipped disc lower back lumbar   Bipolar 1 disorder (HCC)    Depression    Dysfunctional uterine bleeding    Eczema    Hypertension    Insomnia    Migraine headache    Neuromuscular disorder (HCC)    leg cramps    Assessment: 46 year old female with history of hypertension comes in with 3-day history of worsening shortness of breath.    She has noted leg swelling. EKG not showing ischemia, and PE study pending. No anticoagulation reported prior to admission. Hgb 9.5. Pharmacy to dose heparin.  Goal of Therapy:  Heparin level 0.3-0.7 units/ml Monitor platelets by  anticoagulation protocol: Yes   Plan:  Continue heparin infusion at 1100 units/hr Check anti-Xa level in 6 hours and daily while on heparin Continue to monitor H&H and platelets  Titus Dubin, PharmD PGY1 Pharmacy Resident 01/06/2022 4:32 PM

## 2022-01-06 NOTE — Assessment & Plan Note (Addendum)
Mg 1.3, repleted on admission.  -F/u Mg -Goal Mg >2

## 2022-01-06 NOTE — Progress Notes (Signed)
  Echocardiogram 2D Echocardiogram has been performed.  Lana Fish 01/06/2022, 9:58 AM

## 2022-01-06 NOTE — ED Notes (Signed)
Doctor states to hold the lisinopril.

## 2022-01-06 NOTE — ED Notes (Signed)
Patient found on the edge of the bed. Assisted patient back onto bed. RN instructed patient to use call button if assistance is needed. Pt verbalized understanding.

## 2022-01-06 NOTE — Progress Notes (Signed)
FMTS Interim Progress Note  S:Saw patient this morning. She is very thirsty while wearing her BiPAP. She is breathing well. She is amenable to continuing her oxygen supplementation and antibiotics for possible PNA.  O: BP 98/71   Pulse (!) 130   Temp 98.4 F (36.9 C)   Resp (!) 22   Ht 5' 4.5" (1.638 m)   Wt 77.1 kg   SpO2 100%   BMI 28.73 kg/m   General: Alert and oriented, in NAD Skin: Warm, dry HEENT: NCAT, EOM grossly normal, midline nasal septum Cardiac: RRR, no m/r/g appreciated, 2+ pitting edema to knees in BLE Respiratory: Crackles appreciated in bilateral lung bases, breathing and speaking comfortably on BiPAP Extremities: Moves all extremities grossly equally in bed Neurological: No gross focal deficit Psychiatric: Ambivalent mood  A/P: Acute hypoxemic respiratory failure likely volume overload Tolerating BiPAP well. No significant WOB. Remains volume overloaded. -IV lasix 40 mg BID -I/Os, daily weights -Continue azithro and ceft for CAP, though expect resolution with volume depletion -F/u echo results  Elevated troponins Peaked at 946. She does not endorse chest pain today. Suspect demand ischemia 2/2 volume overload. -Cardiology consulted, will f/u recs -Repeat trops -F/u echo  Remainder per H&P earlier this morning.  Ethelene Hal, MD 01/06/2022, 12:54 PM PGY-1, [place] [facility] Service pager [phone]

## 2022-01-06 NOTE — Consult Note (Signed)
CARDIOLOGY CONSULT NOTE  Patient ID: Kristin [name] MRN: [id number] DOB/AGE: 1975/12/20 46 y.o.  Admit date: 01/05/2022 Referring Physician  Dr Thompson Grayer Primary Physician:  Alen Bleacher, MD Reason for Consultation  CHF  Patient ID: Kristin [name], female    DOB: 03-22-1975, 46 y.o.   MRN: [id number]  Chief Complaint  Patient presents with   Shortness of Breath   HPI:    Kristin [name]  is a 46 y.o. female with past medical history significant for hypertension, migraines, and anemia who presented to the hospital with worsening shortness of breath.  The shortness of breath is when she is lying flat or when she is exerting herself.  She is currently on BiPAP and feeling a little bit better.  Patient is still quite tachycardic.  She was recently admitted at [facility] for nausea and vomiting secondary to gastritis.  She was treated for lactic acidosis and AKI at that time.  This morning patient denies chest pain, palpitations, diaphoresis.  She is still very short of breath and is unable to breathe if she lays flat.  Patient denies ever having been diagnosed with any sort of heart failure in the past.    Past Medical History:  Diagnosis Date   Acid reflux    ACL tear 2011   not sure which side   Acute lateral meniscus tear of right knee    Anxiety    Arthritis    Back pain    slipped disc lower back lumbar   Bipolar 1 disorder ([place])    Depression    Dysfunctional uterine bleeding    Eczema    Hypertension    Insomnia    Migraine headache    Neuromuscular disorder (HCC)    leg cramps   Past Surgical History:  Procedure Laterality Date   BIOPSY  09/19/2017   Procedure: BIOPSY;  Surgeon: Otis Brace, MD;  Location: [place];  Service: Gastroenterology;;   BIOPSY  10/15/2019   Procedure: BIOPSY;  Surgeon: Wilford Corner, MD;  Location: [facility];  Service: Endoscopy;;   COLONOSCOPY N/A 10/15/2019   Procedure: COLONOSCOPY;  Surgeon: Wilford Corner, MD;   Location: [facility];  Service: Endoscopy;  Laterality: N/A;   DILITATION & CURRETTAGE/HYSTROSCOPY WITH HYDROTHERMAL ABLATION N/A 04/04/2017   Procedure: DILATATION & CURETTAGE/HYSTEROSCOPY WITH HYDROTHERMAL ABLATION;  Surgeon: Donnamae Jude, MD;  Location: [place];  Service: Gynecology;  Laterality: N/A;   ESOPHAGOGASTRODUODENOSCOPY N/A 10/15/2019   Procedure: ESOPHAGOGASTRODUODENOSCOPY (EGD);  Surgeon: Wilford Corner, MD;  Location: [facility];  Service: Endoscopy;  Laterality: N/A;   ESOPHAGOGASTRODUODENOSCOPY (EGD) WITH PROPOFOL N/A 09/19/2017   Procedure: ESOPHAGOGASTRODUODENOSCOPY (EGD) WITH PROPOFOL;  Surgeon: Otis Brace, MD;  Location: [place];  Service: Gastroenterology;  Laterality: N/A;   KNEE ARTHROSCOPY WITH LATERAL MENISECTOMY Right 09/30/2019   Procedure: KNEE ARTHROSCOPY WITH LATERAL MENISECTOMY;  Surgeon: Renette Butters, MD;  Location: [facility];  Service: Orthopedics;  Laterality: Right;   TUBAL LIGATION  yrs ago   Social History   Tobacco Use   Smoking status: Some Days    Packs/day: 0.50    Years: 5.00    Total pack years: 2.50    Types: Cigarettes   Smokeless tobacco: Never  Substance Use Topics   Alcohol use: Yes    Comment: occassionally, longer than one week ago    Family History  Problem Relation Age of Onset   Breast cancer Maternal Grandmother    Hypertension Mother    Hypertension  Brother    Colon cancer Maternal Uncle    Bipolar disorder Cousin    Schizophrenia Cousin     Marital Status: Single  ROS  Review of Systems  Cardiovascular:  Positive for dyspnea on exertion, leg swelling, orthopnea and palpitations.  Respiratory:  Positive for shortness of breath.    Objective      01/06/2022    7:35 AM 01/06/2022    7:00 AM 01/06/2022    6:45 AM  Vitals with BMI  Systolic 120 115 782  Diastolic 104 96 103  Pulse 131 130 132    Blood pressure (!) 120/104, pulse (!) 131, temperature 99.3 F  (37.4 C), resp. rate (!) 29, height 5' 4.5" (1.638 m), weight 77.1 kg, SpO2 100 %.    Physical Exam Vitals reviewed.  Cardiovascular:     Rate and Rhythm: Regular rhythm. Tachycardia present.     Pulses: Normal pulses.     Heart sounds: No murmur heard. Pulmonary:     Effort: Tachypnea present.     Breath sounds: Examination of the right-lower field reveals decreased breath sounds. Examination of the left-lower field reveals decreased breath sounds. Decreased breath sounds present.  Abdominal:     General: Bowel sounds are normal.  Musculoskeletal:     Right lower leg: Edema present.     Left lower leg: Edema present.  Skin:    General: Skin is warm and dry.  Neurological:     Mental Status: She is alert.    Laboratory examination:   Recent Labs    12/11/21 0110 01/05/22 1754 01/05/22 2314 01/06/22 0520  NA 137 141 141 139  K 3.6 3.8 3.4* 3.5  CL 99 108  --  103  CO2 20* 15*  --  21*  GLUCOSE 83 85  --  123*  BUN 14 7  --  8  CREATININE 1.19* 0.79  --  0.93  CALCIUM 8.5* 8.2*  --  8.0*  GFRNONAA 57* >60  --  >60   estimated creatinine clearance is 76 mL/min (by C-G formula based on SCr of 0.93 mg/dL).     Latest Ref Rng & Units 01/06/2022    5:20 AM 01/05/2022   11:14 PM 01/05/2022    5:54 PM  CMP  Glucose 70 - 99 mg/dL 423   85   BUN 6 - 20 mg/dL 8   7   Creatinine 5.36 - 1.00 mg/dL 1.44   3.15   Sodium 400 - 145 mmol/L 139  141  141   Potassium 3.5 - 5.1 mmol/L 3.5  3.4  3.8   Chloride 98 - 111 mmol/L 103   108   CO2 22 - 32 mmol/L 21   15   Calcium 8.9 - 10.3 mg/dL 8.0   8.2   Total Protein 6.5 - 8.1 g/dL 6.5   6.4   Total Bilirubin 0.3 - 1.2 mg/dL 0.7   0.7   Alkaline Phos 38 - 126 U/L 132   127   AST 15 - 41 U/L 124   161   ALT 0 - 44 U/L 29   29       Latest Ref Rng & Units 01/05/2022   11:14 PM 01/05/2022    5:54 PM 12/10/2021    5:29 PM  CBC  WBC 4.0 - 10.5 K/uL  6.1  3.4   Hemoglobin 12.0 - 15.0 g/dL 9.5  9.1  86.7   Hematocrit 36.0 - 46.0  % 28.0  26.4  28.9  Platelets 150 - 400 K/uL  286  209    Lipid Panel No results for input(s): "CHOL", "TRIG", "LDLCALC", "VLDL", "HDL", "CHOLHDL", "LDLDIRECT" in the last 8760 hours.  HEMOGLOBIN A1C Lab Results  Component Value Date   HGBA1C 5.8 (H) 10/11/2019   MPG 119.76 10/11/2019   TSH No results for input(s): "TSH" in the last 8760 hours. BNP (last 3 results) Recent Labs    01/05/22 1754  BNP 1,216.9*   Cardiac Panel (last 3 results) Recent Labs    01/05/22 2220 01/06/22 0500 01/06/22 0520  TROPONINIHS 747* 946* 940*     Medications and allergies   Allergies  Allergen Reactions   Sumatriptan Anaphylaxis    High blood pressure and numb   Other     Bananas-stomach cramps   Eggs Or Egg-Derived Products Rash and Other (See Comments)    Stomach cramps - " can't eat them fried"     Current Meds  Medication Sig   acetaminophen (TYLENOL) 500 MG tablet Take 1 tablet (500 mg total) by mouth every 8 (eight) hours as needed for mild pain.   diphenhydrAMINE (BENADRYL) 25 MG tablet Take 25 mg by mouth every 6 (six) hours as needed for allergies, sleep or itching.   famotidine (PEPCID) 20 MG tablet TAKE 1 TABLET(20 MG) BY MOUTH TWICE DAILY (Patient taking differently: Take 20 mg by mouth daily as needed for indigestion or heartburn.)   gabapentin (NEURONTIN) 300 MG capsule Take 1 capsule (300 mg total) by mouth 3 (three) times daily. (Patient taking differently: Take 300-600 mg by mouth See admin instructions. Take 2 capsules in the morning and 1 capsule at bedtime)   lisinopril-hydrochlorothiazide (ZESTORETIC) 10-12.5 MG tablet Take 1 tablet by mouth daily.   ondansetron (ZOFRAN) 4 MG tablet Take 1 tablet (4 mg total) by mouth every 8 (eight) hours as needed for nausea or vomiting.   pantoprazole (PROTONIX) 40 MG tablet Take 1 tablet (40 mg total) by mouth daily.   traZODone (DESYREL) 50 MG tablet Take 1 tablet (50 mg total) by mouth at bedtime as needed for sleep.     Scheduled Meds:  famotidine  20 mg Oral BID   folic acid  1 mg Oral Daily   gabapentin  300 mg Oral BID   lisinopril  10 mg Oral Daily   And   hydrochlorothiazide  12.5 mg Oral Daily   multivitamin with minerals  1 tablet Oral Daily   nicotine  14 mg Transdermal Daily   pantoprazole  40 mg Oral Daily   thiamine  100 mg Oral Daily   Continuous Infusions:  azithromycin     cefTRIAXone (ROCEPHIN)  IV     heparin 1,100 Units/hr (01/06/22 0236)   PRN Meds:.ondansetron (ZOFRAN) IV   I/O last 3 completed shifts: In: 200 [IV Piggyback:200] Out: 1000 [Urine:1000] No intake/output data recorded.  Net IO Since Admission: -800 mL [01/06/22 0836]   Radiology:   Imaging results have been reviewed and CT Angio Chest PE W/Cm &/Or Wo Cm  Result Date: 01/06/2022 CLINICAL DATA:  Acute heart failure. Query pulmonary embolus. Shortness of breath and cough. Chest pain and tachycardia. Lower extremity swelling. EXAM: CT ANGIOGRAPHY CHEST WITH CONTRAST TECHNIQUE: Multidetector CT imaging of the chest was performed using the standard protocol during bolus administration of intravenous contrast. Multiplanar CT image reconstructions and MIPs were obtained to evaluate the vascular anatomy. RADIATION DOSE REDUCTION: This exam was performed according to the departmental dose-optimization program which includes automated exposure control, adjustment of the  mA and/or kV according to patient size and/or use of iterative reconstruction technique. CONTRAST:  OMNIPAQUE IOHEXOL 350 MG/ML SOLN COMPARISON:  10/11/2019 FINDINGS: Cardiovascular: Good opacification of the central and segmental pulmonary arteries. No focal filling defects. No evidence of significant pulmonary embolus. Cardiac enlargement. No pericardial effusions. Reflux of contrast material into the hepatic veins suggest right heart failure. Normal caliber thoracic aorta. Mediastinum/Nodes: Thyroid gland is unremarkable. Esophagus is decompressed.  Lymphadenopathy in the mediastinum. Largest lymph nodes measure up to about 10 mm short axis dimension. Similar appearance to previous study. This is likely reactive. Lungs/Pleura: Small bilateral pleural effusions. Patchy nodular perihilar infiltrates throughout both lungs with some interstitial thickening. Appearance suggests edema or multifocal pneumonia. Upper Abdomen: Diffuse fatty infiltration of the liver. No acute process demonstrated in the upper abdomen. Musculoskeletal: No chest wall abnormality. No acute or significant osseous findings. Review of the MIP images confirms the above findings. IMPRESSION: 1. No evidence of significant pulmonary embolus. 2. Cardiac enlargement. 3. Diffuse nodular airspace infiltrates in the lungs with perihilar distribution and scattered interstitial thickening. Changes may represent edema or multifocal pneumonia. Small bilateral pleural effusions. 4. Prominent hilar and mediastinal lymph nodes, likely reactive. Electronically Signed   By: Burman Nieves M.D.   On: 01/06/2022 03:52   DG Chest 2 View  Result Date: 01/05/2022 CLINICAL DATA:  Shortness of breath for 3 days. EXAM: CHEST - 2 VIEW COMPARISON:  12/11/2021 FINDINGS: There is bibasilar patchy airspace disease, new in the interval without pleural effusion. Diffuse interstitial prominence is new. The cardiopericardial silhouette is within normal limits for size. The visualized bony structures of the thorax are unremarkable. IMPRESSION: New bibasilar patchy airspace disease with diffuse interstitial prominence. Imaging features may reflect dependent edema or bibasilar infection. Electronically Signed   By: Kennith Center M.D.   On: 01/05/2022 17:31    Cardiac Studies:     EKG:  01/05/2022 sinus tachycardia with prolonged Qtc, non-specific T wave changes in inferior leads  Assessment   Heart failure with preserved ejection fraction, acute exacerbation Troponin elevation, could be secondary to above +/-  AKI - will need to rule out ischemia Acute hypoxic respiratory failure secondary to heart failure exacerbation requiring BiPAP Alcohol dependence with recent admission for gastritis   Recommendations:   Maintain on telemetry Labs and imaging reviewed Continue with BiPAP and breathing treatments Echocardiogram ordered, pending report Continue to diurese, if troponin still rising we will plan for cardiac catheterization on Monday Optimize electrolytes Further recommendations pending clinical course   Clotilde Dieter, DO, [facility] 01/06/2022, 8:36 AM Office: [phone]

## 2022-01-06 NOTE — Progress Notes (Signed)
ANTICOAGULATION CONSULT NOTE - Initial Consult  Pharmacy Consult for heparin Indication: pulmonary embolus  Allergies  Allergen Reactions   Sumatriptan Anaphylaxis    High blood pressure and numb   Other     Bananas-stomach cramps   Eggs Or Egg-Derived Products Rash and Other (See Comments)    Stomach cramps    Patient Measurements: Height: 5' 4.5" (163.8 cm) Weight: 77.1 kg (170 lb) IBW/kg (Calculated) : 55.85 Heparin Dosing Weight: 72 kg   Vital Signs: Temp: 98.6 F (37 C) (10/26 2046) Temp Source: Oral (10/26 1707) BP: 141/127 (10/26 2345) Pulse Rate: 147 (10/26 2355)  Labs: Recent Labs    01/05/22 1754 01/05/22 2220 01/05/22 2314  HGB 9.1*  --  9.5*  HCT 26.4*  --  28.0*  PLT 286  --   --   CREATININE 0.79  --   --   TROPONINIHS 555* 747*  --     Estimated Creatinine Clearance: 88.4 mL/min (by C-G formula based on SCr of 0.79 mg/dL).   Medical History: Past Medical History:  Diagnosis Date   Acid reflux    ACL tear 2011   not sure which side   Acute lateral meniscus tear of right knee    Anxiety    Arthritis    Back pain    slipped disc lower back lumbar   Bipolar 1 disorder (HCC)    Depression    Dysfunctional uterine bleeding    Eczema    Hypertension    Insomnia    Migraine headache    Neuromuscular disorder (HCC)    leg cramps    Assessment: 46 year old female with history of hypertension comes in with 3-day history of worsening shortness of breath.    She has noted leg swelling. EKG not showing ischemia, and PE study pending. No anticoagulation reported prior to admission. Hgb 9.5. Pharmacy to dose heparin.  Goal of Therapy:  Heparin level 0.3-0.7 units/ml Monitor platelets by anticoagulation protocol: Yes   Plan:  Give 4000 units bolus x 1 Start heparin infusion at 1100 units/hr Check anti-Xa level in 6 hours and daily while on heparin Continue to monitor H&H and platelets  Georga Bora, PharmD Clinical Pharmacist 01/06/2022  12:32 AM Please check AMION for all [place] numbers

## 2022-01-06 NOTE — ED Notes (Signed)
With held the lisinopril due to the pts blood pressure. I let the doctor know via secure chat.

## 2022-01-06 NOTE — Assessment & Plan Note (Addendum)
Alcohol abuse per chart review. UDS + cocaine on admission. Denies h/o withdrawal. AST elevated to 161 on admission. Ethanol level <10. CIWA 0.  -Thiamine, folate, multivitamin -Monitor clinically for signs of withdrawal

## 2022-01-06 NOTE — ED Notes (Signed)
Delay in giving the medication due to ECHO in the room and using one IV. Will immediately start antibiotic when line is available.

## 2022-01-06 NOTE — ED Notes (Signed)
This RN spoke with admitting provider Mabe who is over patient's care. Provider made aware of heparin level not being drawn on time by previous paramedic. This RN also made admitting provider aware of pharmacists instructions to not pause infusion at this time and to still send ordered heparin level. Provider aware of same and in agreement with plan. Patient remains GCS 15 and answering all questions appropriately, vital signs remain stable. NIBP, cardiac monitor and pulse ox remain in place.

## 2022-01-06 NOTE — Progress Notes (Signed)
ANTICOAGULATION CONSULT NOTE - Initial Consult  Pharmacy Consult for heparin Indication: pulmonary embolus  Allergies  Allergen Reactions   Sumatriptan Anaphylaxis    High blood pressure and numb   Other     Bananas-stomach cramps   Eggs Or Egg-Derived Products Rash and Other (See Comments)    Stomach cramps - " can't eat them fried"    Patient Measurements: Height: 5' 4.5" (163.8 cm) Weight: 77.1 kg (170 lb) IBW/kg (Calculated) : 55.85 Heparin Dosing Weight: 72 kg   Vital Signs: Temp: 100.1 F (37.8 C) (10/27 2152) Temp Source: Oral (10/27 2152) BP: 113/86 (10/27 2200) Pulse Rate: 130 (10/27 2000)  Labs: Recent Labs    01/05/22 1754 01/05/22 2220 01/05/22 2314 01/06/22 0500 01/06/22 0520 01/06/22 1349 01/06/22 1530 01/06/22 2053  HGB 9.1*  --  9.5*  --   --   --   --   --   HCT 26.4*  --  28.0*  --   --   --   --   --   PLT 286  --   --   --   --   --   --   --   HEPARINUNFRC  --   --   --   --   --   --  0.37 0.45  CREATININE 0.79  --   --   --  0.93  --   --   --   TROPONINIHS 555*   < >  --    < > 940* 1,184* 1,235* 1,295*   < > = values in this interval not displayed.     Estimated Creatinine Clearance: 76 mL/min (by C-G formula based on SCr of 0.93 mg/dL).   Medical History: Past Medical History:  Diagnosis Date   Acid reflux    ACL tear 2011   not sure which side   Acute lateral meniscus tear of right knee    Anxiety    Arthritis    Back pain    slipped disc lower back lumbar   Bipolar 1 disorder (HCC)    Depression    Dysfunctional uterine bleeding    Eczema    Hypertension    Insomnia    Migraine headache    Neuromuscular disorder (HCC)    leg cramps    Assessment: 46 year old female with history of hypertension comes in with 3-day history of worsening shortness of breath. She has noted leg swelling. EKG not showing ischemia, and PE study pending. No anticoagulation reported prior to admission. Pharmacy to dose heparin.  PM update -  confirmatory heparin level remains therapeutic at 0.45. Hg 9.5, plt WNL on presentation. No bleed issues reported.  Goal of Therapy:  Heparin level 0.3-0.7 units/ml Monitor platelets by anticoagulation protocol: Yes   Plan:  Continue heparin infusion at 1100 units/hr Monitor daily heparin level/CBC, s/sx bleeding   Arturo Morton, PharmD, BCPS Please check AMION for all [place] contact numbers Clinical Pharmacist 01/06/2022 10:27 PM

## 2022-01-06 NOTE — Progress Notes (Signed)
FMTS Brief Progress Note  S: In to see patient for evening rounds. Complains of aching in bilateral legs and ongoing cough. Otherwise denies concerns--- reports her breathing is much improved. She has no chest pain at rest. Only has L sided chest pain with coughing.  O: BP 108/78   Pulse (!) 130   Temp 98.3 F (36.8 C) (Oral)   Resp (!) 30   Ht 5' 4.5" (1.638 m)   Wt 77.1 kg   SpO2 100%   BMI 28.73 kg/m   Gen: alert, resting comfortably in bed, NAD CV: tachycardic, regular rhythm, normal S1/S2 Resp: resp rate in low 20s on room air, appears comfortable, speaking in full sentences, coughing intermittently, breath sounds equal bilaterally, no appreciable crackles or wheezes Ext: 2+ pitting pedal edema bilaterally Psych: appropriate mood and affect  A/P: Acute Hypoxemic Respiratory Failure 2/2 new HFrEF Improving with diuresis. Currently breathing comfortably on room air. Echo showed EF 25-30% with severely decreased LV function & regional wall motion abnormalities. -Close monitoring of respiratory status, low threshold for resuming BiPAP if needed -Continue IV diuresis  CAP Stable on room air currently. -Continue Ceftriaxone and Azithro -Add tessalon and dextromethorphan-guaifenecin for symptomatic relief  Elevated Troponin 555 on admission >> 1235 most recently.  -Cardiology following, appreciate recommendations -Continue heparin gtt -Trend trop -Cards tentatively planning cath if trops continue to rise  - Orders reviewed. Labs for AM ordered, which was adjusted as needed.   Remainder per day team notes.  Alcus Dad, MD 01/06/2022, 9:19 PM PGY-3, [facility] Night Resident  Please page [phone] with questions.

## 2022-01-07 DIAGNOSIS — I5021 Acute systolic (congestive) heart failure: Secondary | ICD-10-CM

## 2022-01-07 DIAGNOSIS — J189 Pneumonia, unspecified organism: Secondary | ICD-10-CM

## 2022-01-07 DIAGNOSIS — J121 Respiratory syncytial virus pneumonia: Secondary | ICD-10-CM | POA: Insufficient documentation

## 2022-01-07 LAB — MAGNESIUM: Magnesium: 1.8 mg/dL (ref 1.7–2.4)

## 2022-01-07 LAB — CBC
HCT: 25 % — ABNORMAL LOW (ref 36.0–46.0)
Hemoglobin: 8.4 g/dL — ABNORMAL LOW (ref 12.0–15.0)
MCH: 29.5 pg (ref 26.0–34.0)
MCHC: 33.6 g/dL (ref 30.0–36.0)
MCV: 87.7 fL (ref 80.0–100.0)
Platelets: 199 10*3/uL (ref 150–400)
RBC: 2.85 MIL/uL — ABNORMAL LOW (ref 3.87–5.11)
RDW: 17.9 % — ABNORMAL HIGH (ref 11.5–15.5)
WBC: 7.6 10*3/uL (ref 4.0–10.5)
nRBC: 0 % (ref 0.0–0.2)

## 2022-01-07 LAB — COMPREHENSIVE METABOLIC PANEL
ALT: 21 U/L (ref 0–44)
AST: 73 U/L — ABNORMAL HIGH (ref 15–41)
Albumin: 2.6 g/dL — ABNORMAL LOW (ref 3.5–5.0)
Alkaline Phosphatase: 109 U/L (ref 38–126)
Anion gap: 10 (ref 5–15)
BUN: 11 mg/dL (ref 6–20)
CO2: 21 mmol/L — ABNORMAL LOW (ref 22–32)
Calcium: 7.7 mg/dL — ABNORMAL LOW (ref 8.9–10.3)
Chloride: 107 mmol/L (ref 98–111)
Creatinine, Ser: 1.08 mg/dL — ABNORMAL HIGH (ref 0.44–1.00)
GFR, Estimated: >60 mL/min (ref >60)
Glucose, Bld: 90 mg/dL (ref 70–99)
Potassium: 3.6 mmol/L (ref 3.5–5.1)
Sodium: 138 mmol/L (ref 135–145)
Total Bilirubin: 0.8 mg/dL (ref 0.3–1.2)
Total Protein: 6.2 g/dL — ABNORMAL LOW (ref 6.5–8.1)

## 2022-01-07 LAB — HEPARIN LEVEL (UNFRACTIONATED): Heparin Unfractionated: 0.49 IU/mL (ref 0.30–0.70)

## 2022-01-07 MED ORDER — MAGNESIUM SULFATE IN D5W 1-5 GM/100ML-% IV SOLN
1.0000 g | Freq: Once | INTRAVENOUS | Status: DC
  Filled 2022-01-07: qty 100

## 2022-01-07 MED ORDER — SPIRONOLACTONE 12.5 MG HALF TABLET
12.5000 mg | ORAL_TABLET | Freq: Every day | ORAL | Status: DC
  Administered 2022-01-08 – 2022-01-12 (×5): 12.5 mg via ORAL
  Filled 2022-01-07 (×6): qty 1

## 2022-01-07 MED ORDER — POTASSIUM CHLORIDE CRYS ER 20 MEQ PO TBCR
40.0000 meq | EXTENDED_RELEASE_TABLET | Freq: Once | ORAL | Status: AC
  Administered 2022-01-07: 40 meq via ORAL
  Filled 2022-01-07: qty 2

## 2022-01-07 NOTE — ED Notes (Signed)
IV team was contacted. Waiting for them to respond/ start an IV.

## 2022-01-07 NOTE — ED Notes (Addendum)
Pt is becoming very agitated again and states her arm is hurting and to immediately stop the medications and blood pressure cuff. I assessed the area and didn't notice any obvious signs of infiltration but still immediately stopped the infusion and pulled the BP cuff off. I spoke with the ER doctor regarding this and he stated to contact the

## 2022-01-07 NOTE — Progress Notes (Signed)
Pt refuseing cpap tonight 

## 2022-01-07 NOTE — ED Notes (Signed)
Pt states her arm is too sore for a BP cuff.

## 2022-01-07 NOTE — Progress Notes (Signed)
Subjective:  Feels okay Legs still swollen  Objective:  Vital Signs in the last 24 hours: Temp:  [98.3 F (36.8 C)-100.1 F (37.8 C)] 99.2 F (37.3 C) (10/28 0833) Pulse Rate:  [112-131] 120 (10/28 0836) Resp:  [14-44] 23 (10/28 0836) BP: (79-113)/(60-86) 100/72 (10/28 0836) SpO2:  [95 %-100 %] 98 % (10/28 0836)  Intake/Output from previous day: 10/27 0701 - 10/28 0700 In: 288.9 [IV Piggyback:288.9] Out: -   Physical Exam Vitals and nursing note reviewed.  Constitutional:      General: She is not in acute distress. Neck:     Vascular: JVD present.  Cardiovascular:     Rate and Rhythm: Normal rate and regular rhythm.     Heart sounds: Normal heart sounds. No murmur heard. Pulmonary:     Effort: Pulmonary effort is normal.     Breath sounds: Normal breath sounds. No wheezing or rales.  Musculoskeletal:     Right lower leg: Edema (2+) present.     Left lower leg: Edema (2+) present.      Imaging/tests reviewed and independently interpreted: CXR 01/05/2022: New bibasilar patchy airspace disease with diffuse interstitial prominence. Imaging features may reflect dependent edema or bibasilar infection.    Cardiac Studies:  EKG 01/05/2022: Sinus tachycardia 148 bpm Right atrial enlargement Nonspecific T wave abnormality Prolonged QT interval  Echocardiogram 01/06/2022:  1. Consider Takotsubo.   2. Left ventricular ejection fraction, by estimation, is 25 to 30%. The  left ventricle has severely decreased function. The left ventricle  demonstrates regional wall motion abnormalities (see scoring  diagram/findings for description). Left ventricular  diastolic parameters are indeterminate.   3. Right ventricular systolic function is normal. The right ventricular  size is normal. There is normal pulmonary artery systolic pressure.   4. The mitral valve is normal in structure. No evidence of mitral valve  regurgitation. No evidence of mitral stenosis.   5. The aortic  valve is tricuspid. Aortic valve regurgitation is not  visualized. No aortic stenosis is present.   6. The inferior vena cava is normal in size with greater than 50%  respiratory variability, suggesting right atrial pressure of 3 mmHg.   Comparison(s): Prior images reviewed side by side. The left ventricular  function is worsened.     Assessment & Recommendations:  46 year old African-American female with new diagnosis of acute systolic heart failure, acute hypoxic respiratory failure, alcohol dependence  Acute systolic heart failure: Differentials include alcohol induced cardiomyopathy, possible stress-induced cardiomyopathy-however diagnosis of exclusion only after ruling out obstructive coronary artery disease. Continue heart failure management with IV diuresis.  Continue IV Lasix 40 mg twice daily.  Need strict I/O. Change lisinopril-hydrochlorothiazide to losartan 25 mg daily from tomorrow subject to blood pressure.  If patient exhibits compliance, this can be changed to [facility] outpatient. Add spironolactone 12.5 mg daily. Will consider heart catheterization on Monday.  Troponin elevation: Likely 2/2 heart failure. Okay to continue heparin for now.      Nigel Mormon, MD Pager: [phone] Office: [phone]

## 2022-01-07 NOTE — ED Notes (Signed)
Patients bed saturated in urine. Full bed linen change done.

## 2022-01-07 NOTE — Progress Notes (Signed)
Daily Progress Note Intern Pager: [phone]  Patient name: Kristin [name] Medical record number: [id number] Date of birth: 1976-01-29 Age: 46 y.o. Gender: female  Primary Care Provider: Alen Bleacher, MD Consultants: Cardiology Code Status: Full  Pt Overview and Major Events to Date:  10/27-admitted  Assessment and Plan:  Kristin [name] is a 46 y.o. female presenting with shortness of breath found acute CHF exacerbation with acute hypoxemic respiratory failure, possible CAP, elevated troponins.  Pertinent PMH includes HTN, alcohol/cocaine use, bipolar disorder.  * Acute HFrEF (heart failure with reduced ejection fraction) ([place]) New onset HFrEF initially with acute hypoxemic respiratory failure.  EF 25-30% with ?concern for Takotsubo.  Improving with diuresis, no longer requiring oxygen.  CTA negative for PE. - Cardiac Telemetry - Cardiology consulted, appreciate care and recommendations - Continue diuresis: Lasix 40mg  IV BID - Strict I/Os, Daily weights - F/u CBC,CMP  Community acquired pneumonia CTA with findings suggestive of possible multifocal pneumonia.  Reassuringly has remained afebrile without leukocytosis. -Continue ceftriaxone, azithromycin -Continue Tessalon, Tylenol, Zofran as needed  Elevated troponin Peaked at 1295, most recently 1158. Likely demand ischemia in the setting of acute CHF. ?Takotsubo per echo. Patient denies chest pain currently. -Continue heparin gtt -Cardiology consulted, appreciate recommendations -Tele monitoring -Repeat EKG if having chest pain  Substance abuse ([place]) Alcohol abuse per chart review. UDS + cocaine on admission. Denies h/o withdrawal. AST elevated to 161 on admission. Ethanol level <10. -Thiamine, folate, multivitamin -Monitor clinically for signs of withdrawal   Chronic anemia Normocytic anemia, likely anemia of chronic disease. Hgb 10-11 at baseline, 9.1 on admission. Stable. -F/u CBC   Electrolytes - repleted Mg  (1.8) with 1g IV and K (3.6) with 7mEq     FEN/GI: Heart healthy diet PPx: Heparin drip Dispo: Pending clinical improvement, diuresis, cardiology recommendations  Subjective:  Still coughing but overall no concerns this morning. Breathing fine. Denies CP, palpitations, abd pain  Objective: Temp:  [98.3 F (36.8 C)-100.1 F (37.8 C)] 99.2 F (37.3 C) (10/28 0833) Pulse Rate:  [112-131] 120 (10/28 0836) Resp:  [14-44] 23 (10/28 0836) BP: (79-113)/(60-86) 100/72 (10/28 0836) SpO2:  [95 %-100 %] 98 % (10/28 0836) Physical Exam: General: NAD, coughing, pleasant Cardiovascular: tachycardic, regular rhythm Respiratory: decreased breath sounds but equal bilaterally, no crackles/wheezes appreciated Abdomen: soft NT/ND Extremities: b/l lower extremity pitting edema, 2+  Laboratory: Most recent CBC Lab Results  Component Value Date   WBC 7.6 01/07/2022   HGB 8.4 (L) 01/07/2022   HCT 25.0 (L) 01/07/2022   MCV 87.7 01/07/2022   PLT 199 01/07/2022   Most recent BMP    Latest Ref Rng & Units 01/07/2022    9:53 AM  BMP  Glucose 70 - 99 mg/dL 90   BUN 6 - 20 mg/dL 11   Creatinine 0.44 - 1.00 mg/dL 1.08   Sodium 135 - 145 mmol/L 138   Potassium 3.5 - 5.1 mmol/L 3.6   Chloride 98 - 111 mmol/L 107   CO2 22 - 32 mmol/L 21   Calcium 8.9 - 10.3 mg/dL 7.7     Mg 1.8  Imaging/Diagnostic Tests:  CTA PE IMPRESSION: 1. No evidence of significant pulmonary embolus. 2. Cardiac enlargement. 3. Diffuse nodular airspace infiltrates in the lungs with perihilar distribution and scattered interstitial thickening. Changes may represent edema or multifocal pneumonia. Small bilateral pleural effusions. 4. Prominent hilar and mediastinal lymph nodes, likely reactive.   Kristin Albino, MD 01/07/2022, 11:03 AM  PGY-1, [place]  [place] Intern pager: [phone], text pages welcome Secure chat group [place]

## 2022-01-07 NOTE — ED Notes (Signed)
IV team states that the pts IV that she was concerned about is not infiltrated its fine.

## 2022-01-07 NOTE — Progress Notes (Signed)
ANTICOAGULATION CONSULT NOTE   Pharmacy Consult for heparin Indication: ACS  Allergies  Allergen Reactions   Sumatriptan Anaphylaxis    High blood pressure and numb   Other     Bananas-stomach cramps   Eggs Or Egg-Derived Products Rash and Other (See Comments)    Stomach cramps - " can't eat them fried"    Patient Measurements: Height: 5' 4.5" (163.8 cm) Weight: 77.1 kg (170 lb) IBW/kg (Calculated) : 55.85 Heparin Dosing Weight: 72 kg   Vital Signs: Temp: 99.2 F (37.3 C) (10/28 0833) Temp Source: Oral (10/28 0833) BP: 100/72 (10/28 0836) Pulse Rate: 120 (10/28 0836)  Labs: Recent Labs    01/05/22 1754 01/05/22 2220 01/05/22 2314 01/06/22 0500 01/06/22 0520 01/06/22 1349 01/06/22 1530 01/06/22 2053 01/06/22 2200 01/07/22 0953 01/07/22 0958  HGB 9.1*  --  9.5*  --   --   --   --   --   --  8.4*  --   HCT 26.4*  --  28.0*  --   --   --   --   --   --  25.0*  --   PLT 286  --   --   --   --   --   --   --   --  199  --   HEPARINUNFRC  --   --   --   --   --   --  0.37 0.45  --   --  0.49  CREATININE 0.79  --   --   --  0.93  --   --   --   --   --   --   TROPONINIHS 555*   < >  --    < > 940*   < > 1,235* 1,295* 1,158*  --   --    < > = values in this interval not displayed.     Estimated Creatinine Clearance: 76 mL/min (by C-G formula based on SCr of 0.93 mg/dL).   Medical History: Past Medical History:  Diagnosis Date   Acid reflux    ACL tear 2011   not sure which side   Acute lateral meniscus tear of right knee    Anxiety    Arthritis    Back pain    slipped disc lower back lumbar   Bipolar 1 disorder (HCC)    Depression    Dysfunctional uterine bleeding    Eczema    Hypertension    Insomnia    Migraine headache    Neuromuscular disorder (HCC)    leg cramps    Assessment: 46 year old female with history of hypertension comes in with 3-day history of worsening shortness of breath. She has noted leg swelling. EKG not showing ischemia, and PE  study pending. No anticoagulation reported prior to admission. Pharmacy to dose heparin.  Heparin level remains therapeutic on 1100 units/hr  Goal of Therapy:  Heparin level 0.3-0.7 units/ml Monitor platelets by anticoagulation protocol: Yes   Plan:  Continue heparin gtt at 1100 units/hr Daily heparin level, CBC, s/s bleeding F/u cards eval and recs  Bertis Ruddy, PharmD Clinical Pharmacist ED Pharmacist Phone # [phone] 01/07/2022 10:39 AM

## 2022-01-07 NOTE — Assessment & Plan Note (Signed)
Respiratory panel shows RSV infection. Improving SOB, was on RA yesterday, only has to be on [place] this morning with exerted effort.   -Continue Tessalon, Tylenol, Zofran as needed -Encourage I/S

## 2022-01-07 NOTE — ED Notes (Signed)
Lisinopril was held until pt allows for a BP to be taken.

## 2022-01-07 NOTE — ED Notes (Signed)
The pt and a family member  are asleep

## 2022-01-08 DIAGNOSIS — J9601 Acute respiratory failure with hypoxia: Secondary | ICD-10-CM

## 2022-01-08 DIAGNOSIS — I5021 Acute systolic (congestive) heart failure: Secondary | ICD-10-CM | POA: Diagnosis not present

## 2022-01-08 DIAGNOSIS — D649 Anemia, unspecified: Secondary | ICD-10-CM

## 2022-01-08 DIAGNOSIS — I509 Heart failure, unspecified: Secondary | ICD-10-CM | POA: Diagnosis not present

## 2022-01-08 LAB — BASIC METABOLIC PANEL
Anion gap: 10 (ref 5–15)
BUN: 8 mg/dL (ref 6–20)
CO2: 21 mmol/L — ABNORMAL LOW (ref 22–32)
Calcium: 7.9 mg/dL — ABNORMAL LOW (ref 8.9–10.3)
Chloride: 105 mmol/L (ref 98–111)
Creatinine, Ser: 1.02 mg/dL — ABNORMAL HIGH (ref 0.44–1.00)
GFR, Estimated: >60 mL/min (ref >60)
Glucose, Bld: 87 mg/dL (ref 70–99)
Potassium: 4.1 mmol/L (ref 3.5–5.1)
Sodium: 136 mmol/L (ref 135–145)

## 2022-01-08 LAB — RESPIRATORY PANEL BY PCR

## 2022-01-08 LAB — CBC
HCT: 21.4 % — ABNORMAL LOW (ref 36.0–46.0)
HCT: 22 % — ABNORMAL LOW (ref 36.0–46.0)
Hemoglobin: 7.5 g/dL — ABNORMAL LOW (ref 12.0–15.0)
Hemoglobin: 7.8 g/dL — ABNORMAL LOW (ref 12.0–15.0)
MCH: 30.2 pg (ref 26.0–34.0)
MCH: 30.4 pg (ref 26.0–34.0)
MCHC: 35 g/dL (ref 30.0–36.0)
MCHC: 35.5 g/dL (ref 30.0–36.0)
MCV: 86.3 fL (ref 80.0–100.0)
MCV: 85.6 fL (ref 80.0–100.0)
Platelets: 163 10*3/uL (ref 150–400)
Platelets: 159 10*3/uL (ref 150–400)
RBC: 2.48 MIL/uL — ABNORMAL LOW (ref 3.87–5.11)
RBC: 2.57 MIL/uL — ABNORMAL LOW (ref 3.87–5.11)
RDW: 18.3 % — ABNORMAL HIGH (ref 11.5–15.5)
RDW: 18.3 % — ABNORMAL HIGH (ref 11.5–15.5)
WBC: 6.7 10*3/uL (ref 4.0–10.5)
WBC: 7.4 10*3/uL (ref 4.0–10.5)
nRBC: 0.3 % — ABNORMAL HIGH (ref 0.0–0.2)
nRBC: 0.3 % — ABNORMAL HIGH (ref 0.0–0.2)

## 2022-01-08 LAB — HEPARIN LEVEL (UNFRACTIONATED): Heparin Unfractionated: 0.37 IU/mL (ref 0.30–0.70)

## 2022-01-08 MED ORDER — AZITHROMYCIN 250 MG PO TABS
500.0000 mg | ORAL_TABLET | Freq: Once | ORAL | Status: AC
  Administered 2022-01-08: 500 mg via ORAL
  Filled 2022-01-08: qty 2

## 2022-01-08 MED ORDER — IVABRADINE HCL 5 MG PO TABS
5.0000 mg | ORAL_TABLET | Freq: Two times a day (BID) | ORAL | Status: DC
  Administered 2022-01-08 – 2022-01-12 (×9): 5 mg via ORAL
  Filled 2022-01-08 (×11): qty 1

## 2022-01-08 MED ORDER — FUROSEMIDE 10 MG/ML IJ SOLN
40.0000 mg | Freq: Every day | INTRAMUSCULAR | Status: DC
  Administered 2022-01-09 – 2022-01-10 (×2): 40 mg via INTRAVENOUS
  Filled 2022-01-08 (×2): qty 4

## 2022-01-08 MED ORDER — DAPAGLIFLOZIN PROPANEDIOL 10 MG PO TABS
10.0000 mg | ORAL_TABLET | Freq: Every morning | ORAL | Status: DC
  Administered 2022-01-08 – 2022-01-12 (×5): 10 mg via ORAL
  Filled 2022-01-08 (×5): qty 1

## 2022-01-08 MED ORDER — ONDANSETRON 4 MG PO TBDP
4.0000 mg | ORAL_TABLET | Freq: Once | ORAL | Status: DC
  Filled 2022-01-08: qty 1

## 2022-01-08 MED ORDER — LOSARTAN POTASSIUM 25 MG PO TABS
12.5000 mg | ORAL_TABLET | Freq: Every day | ORAL | Status: DC
  Administered 2022-01-08 – 2022-01-11 (×4): 12.5 mg via ORAL
  Filled 2022-01-08 (×4): qty 1

## 2022-01-08 NOTE — Progress Notes (Signed)
Spoke with Dr. Hulen Skains CMO for c-diff consultation and discussed patient having watery stools to the point of fecal incontinence. This is new and was not present on admission. >3 stools in past 8 hours. No fever, white count or abdominal pain currently. RSV positive although unsure if this could be contributing. He does not recommend testing for c-diff or enteric precautions currently. He says to give it 1-2 days to see if improving before testing.

## 2022-01-08 NOTE — H&P (View-Only) (Signed)
Subjective:  Denies chest pain or shortness of breath. Continues to have episodes of coughing Fianc at bedside  Objective:  Vital Signs in the last 24 hours: Temp:  [98.2 F (36.8 C)-99.4 F (37.4 C)] 98.9 F (37.2 C) (10/29 0504) Pulse Rate:  [110-120] 110 (10/29 0840) Resp:  [18-22] 20 (10/29 0840) BP: (91-118)/(72-95) 118/95 (10/29 0840) SpO2:  [91 %-100 %] 100 % (10/29 0840) Weight:  [76.8 kg] 76.8 kg (10/29 0504)  Net IO Since Admission: -2,869.6 mL [01/08/22 1355]  Telemetry: Sinus tachycardia.  Physical Exam Vitals and nursing note reviewed.  Constitutional:      General: She is not in acute distress. Neck:     Vascular: No JVD.  Cardiovascular:     Rate and Rhythm: Normal rate and regular rhythm.     Heart sounds: Normal heart sounds. No murmur heard. Pulmonary:     Effort: Pulmonary effort is normal.     Breath sounds: Normal breath sounds. No wheezing or rales.  Musculoskeletal:     Right lower leg: Edema (trace) present.     Left lower leg: Edema (trace) present.     CXR 01/05/2022: New bibasilar patchy airspace disease with diffuse interstitial prominence. Imaging features may reflect dependent edema or bibasilar infection.    Cardiac Studies:  EKG 01/05/2022: Sinus tachycardia 148 bpm Right atrial enlargement Nonspecific T wave abnormality Prolonged QT interval  Echocardiogram 01/06/2022:  1. Consider Takotsubo.   2. Left ventricular ejection fraction, by estimation, is 25 to 30%. The  left ventricle has severely decreased function. The left ventricle  demonstrates regional wall motion abnormalities (see scoring  diagram/findings for description). Left ventricular  diastolic parameters are indeterminate.   3. Right ventricular systolic function is normal. The right ventricular  size is normal. There is normal pulmonary artery systolic pressure.   4. The mitral valve is normal in structure. No evidence of mitral valve  regurgitation. No  evidence of mitral stenosis.   5. The aortic valve is tricuspid. Aortic valve regurgitation is not  visualized. No aortic stenosis is present.   6. The inferior vena cava is normal in size with greater than 50%  respiratory variability, suggesting right atrial pressure of 3 mmHg.   Comparison(s): Prior images reviewed side by side. The left ventricular  function is worsened.   LABORATORY DATA:    Latest Ref Rng & Units 01/08/2022    7:34 AM 01/07/2022    9:53 AM 01/05/2022   11:14 PM  CBC  WBC 4.0 - 10.5 K/uL 6.7  7.6    Hemoglobin 12.0 - 15.0 g/dL 7.5  8.4  9.5   Hematocrit 36.0 - 46.0 % 21.4  25.0  28.0   Platelets 150 - 400 K/uL 163  199         Latest Ref Rng & Units 01/08/2022    7:34 AM 01/07/2022    9:53 AM 01/06/2022    5:20 AM  CMP  Glucose 70 - 99 mg/dL 87  90  283   BUN 6 - 20 mg/dL 8  11  8    Creatinine 0.44 - 1.00 mg/dL  1.51  7.61   Sodium 135 - 145 mmol/L 136  138  139   Potassium 3.5 - 5.1 mmol/L 4.1  3.6  3.5   Chloride 98 - 111 mmol/L 105  107  103   CO2 22 - 32 mmol/L 21  21  21    Calcium 8.9 - 10.3 mg/dL 7.9  7.7  8.0  Total Protein 6.5 - 8.1 g/dL  6.2  6.5   Total Bilirubin 0.3 - 1.2 mg/dL  0.8  0.7   Alkaline Phos 38 - 126 U/L  109  132   AST 15 - 41 U/L  73  124   ALT 0 - 44 U/L  21  29     Lipid Panel  Lab Results  Component Value Date   CHOL 170 01/06/2022   HDL 72 01/06/2022   LDLCALC 80 01/06/2022   TRIG 90 01/06/2022   CHOLHDL 2.4 01/06/2022    BNP (last 3 results) Recent Labs    01/05/22 1754  BNP 1,216.9*   BMP Recent Labs    01/06/22 0520 01/07/22 0953 01/08/22 0734  NA 139 138 136  K 3.5 3.6 4.1  CL 103 107 105  CO2 21* 21* 21*  GLUCOSE 123* 90 87  BUN 8 11 8   CREATININE 0.93 1.08* 1.02*  CALCIUM 8.0* 7.7* 7.9*  GFRNONAA >60 >60 >60    HEMOGLOBIN A1C Lab Results  Component Value Date   HGBA1C 5.8 (H) 10/11/2019   MPG 119.76 10/11/2019    Cardiac Panel (last 3 results) Recent Labs     01/06/22 1530 01/06/22 2053 01/06/22 2200  TROPONINIHS 1,235* 1,295* 1,158*   Drugs of Abuse     Component Value Date/Time   LABOPIA NONE DETECTED 01/06/2022 0756   COCAINSCRNUR POSITIVE (A) 01/06/2022 0756   LABBENZ NONE DETECTED 01/06/2022 0756   AMPHETMU NONE DETECTED 01/06/2022 0756   THCU NONE DETECTED 01/06/2022 0756   LABBARB NONE DETECTED 01/06/2022 0756     Assessment & Recommendations:  46 year old African-American female with new diagnosis of acute systolic heart failure, acute hypoxic respiratory failure, alcohol dependence  Acute systolic heart failure: Differentials include alcohol induced cardiomyopathy, ? Viral etiology (recent sick exposure), cocaine induced, possible stress-induced cardiomyopathy-however diagnosis of exclusion only after ruling out obstructive coronary artery disease. Check BNP UDS positive for cocaine.  Hx of ETOH abuse  Clinically appears to be more euvolemic.  Net IO Since Admission: -2,869.6 mL [01/08/22 1407] We will reduce IV Lasix 40 mg twice daily to daily Start losartan 12.5 mg p.o. every afternoon with holding parameters Start Farxiga 10 mg p.o. every morning Start Corlanor 5 mg p.o. twice daily Check respiratory pathogen's given her recent sick contacts.  Flu and COVID-19 recently noted to be negative. Tentatively scheduled for heart catheterization tomorrow.  Risks, benefits, and alternatives discussed.  Her questions and concerns were addressed.  N.p.o. after midnight.  Troponin elevation: Likely 2/2 heart failure. Okay to continue heparin for now.   Community-acquired pneumonia: Being treated for by primary team  Cocaine Use: Educated on importance of complete cessation.  Total time spent: 35 minutes.  Rex Kras, [place], [facility]  Pager: [phone] Office: [phone]

## 2022-01-08 NOTE — Progress Notes (Signed)
Subjective:  Denies chest pain or shortness of breath. Continues to have episodes of coughing Fianc at bedside  Objective:  Vital Signs in the last 24 hours: Temp:  [98.2 F (36.8 C)-99.4 F (37.4 C)] 98.9 F (37.2 C) (10/29 0504) Pulse Rate:  [110-120] 110 (10/29 0840) Resp:  [18-22] 20 (10/29 0840) BP: (91-118)/(72-95) 118/95 (10/29 0840) SpO2:  [91 %-100 %] 100 % (10/29 0840) Weight:  [76.8 kg] 76.8 kg (10/29 0504)  Net IO Since Admission: -2,869.6 mL [01/08/22 1355]  Telemetry: Sinus tachycardia.  Physical Exam Vitals and nursing note reviewed.  Constitutional:      General: She is not in acute distress. Neck:     Vascular: No JVD.  Cardiovascular:     Rate and Rhythm: Normal rate and regular rhythm.     Heart sounds: Normal heart sounds. No murmur heard. Pulmonary:     Effort: Pulmonary effort is normal.     Breath sounds: Normal breath sounds. No wheezing or rales.  Musculoskeletal:     Right lower leg: Edema (trace) present.     Left lower leg: Edema (trace) present.     CXR 01/05/2022: New bibasilar patchy airspace disease with diffuse interstitial prominence. Imaging features may reflect dependent edema or bibasilar infection.    Cardiac Studies:  EKG 01/05/2022: Sinus tachycardia 148 bpm Right atrial enlargement Nonspecific T wave abnormality Prolonged QT interval  Echocardiogram 01/06/2022:  1. Consider Takotsubo.   2. Left ventricular ejection fraction, by estimation, is 25 to 30%. The  left ventricle has severely decreased function. The left ventricle  demonstrates regional wall motion abnormalities (see scoring  diagram/findings for description). Left ventricular  diastolic parameters are indeterminate.   3. Right ventricular systolic function is normal. The right ventricular  size is normal. There is normal pulmonary artery systolic pressure.   4. The mitral valve is normal in structure. No evidence of mitral valve  regurgitation. No  evidence of mitral stenosis.   5. The aortic valve is tricuspid. Aortic valve regurgitation is not  visualized. No aortic stenosis is present.   6. The inferior vena cava is normal in size with greater than 50%  respiratory variability, suggesting right atrial pressure of 3 mmHg.   Comparison(s): Prior images reviewed side by side. The left ventricular  function is worsened.   LABORATORY DATA:    Latest Ref Rng & Units 01/08/2022    7:34 AM 01/07/2022    9:53 AM 01/05/2022   11:14 PM  CBC  WBC 4.0 - 10.5 K/uL 6.7  7.6    Hemoglobin 12.0 - 15.0 g/dL 7.5  8.4  9.5   Hematocrit 36.0 - 46.0 % 21.4  25.0  28.0   Platelets 150 - 400 K/uL 163  199         Latest Ref Rng & Units 01/08/2022    7:34 AM 01/07/2022    9:53 AM 01/06/2022    5:20 AM  CMP  Glucose 70 - 99 mg/dL 87  90  740   BUN 6 - 20 mg/dL 8  11  8    Creatinine 0.44 - 1.00 mg/dL  8.14  4.81   Sodium 135 - 145 mmol/L 136  138  139   Potassium 3.5 - 5.1 mmol/L 4.1  3.6  3.5   Chloride 98 - 111 mmol/L 105  107  103   CO2 22 - 32 mmol/L 21  21  21    Calcium 8.9 - 10.3 mg/dL 7.9  7.7  8.0  Total Protein 6.5 - 8.1 g/dL  6.2  6.5   Total Bilirubin 0.3 - 1.2 mg/dL  0.8  0.7   Alkaline Phos 38 - 126 U/L  109  132   AST 15 - 41 U/L  73  124   ALT 0 - 44 U/L  21  29     Lipid Panel  Lab Results  Component Value Date   CHOL 170 01/06/2022   HDL 72 01/06/2022   LDLCALC 80 01/06/2022   TRIG 90 01/06/2022   CHOLHDL 2.4 01/06/2022    BNP (last 3 results) Recent Labs    01/05/22 1754  BNP 1,216.9*   BMP Recent Labs    01/06/22 0520 01/07/22 0953 01/08/22 0734  NA 139 138 136  K 3.5 3.6 4.1  CL 103 107 105  CO2 21* 21* 21*  GLUCOSE 123* 90 87  BUN 8 11 8   CREATININE 0.93 1.08* 1.02*  CALCIUM 8.0* 7.7* 7.9*  GFRNONAA >60 >60 >60    HEMOGLOBIN A1C Lab Results  Component Value Date   HGBA1C 5.8 (H) 10/11/2019   MPG 119.76 10/11/2019    Cardiac Panel (last 3 results) Recent Labs     01/06/22 1530 01/06/22 2053 01/06/22 2200  TROPONINIHS 1,235* 1,295* 1,158*   Drugs of Abuse     Component Value Date/Time   LABOPIA NONE DETECTED 01/06/2022 0756   COCAINSCRNUR POSITIVE (A) 01/06/2022 0756   LABBENZ NONE DETECTED 01/06/2022 0756   AMPHETMU NONE DETECTED 01/06/2022 0756   THCU NONE DETECTED 01/06/2022 0756   LABBARB NONE DETECTED 01/06/2022 0756     Assessment & Recommendations:  46 year old African-American female with new diagnosis of acute systolic heart failure, acute hypoxic respiratory failure, alcohol dependence  Acute systolic heart failure: Differentials include alcohol induced cardiomyopathy, ? Viral etiology (recent sick exposure), cocaine induced, possible stress-induced cardiomyopathy-however diagnosis of exclusion only after ruling out obstructive coronary artery disease. Check BNP UDS positive for cocaine.  Hx of ETOH abuse  Clinically appears to be more euvolemic.  Net IO Since Admission: -2,869.6 mL [01/08/22 1407] We will reduce IV Lasix 40 mg twice daily to daily Start losartan 12.5 mg p.o. every afternoon with holding parameters Start Farxiga 10 mg p.o. every morning Start Corlanor 5 mg p.o. twice daily Check respiratory pathogen's given her recent sick contacts.  Flu and COVID-19 recently noted to be negative. Tentatively scheduled for heart catheterization tomorrow.  Risks, benefits, and alternatives discussed.  Her questions and concerns were addressed.  N.p.o. after midnight.  Troponin elevation: Likely 2/2 heart failure. Okay to continue heparin for now.   Community-acquired pneumonia: Being treated for by primary team  Cocaine Use: Educated on importance of complete cessation.  Total time spent: 35 minutes.  Rex Kras, [place], [facility]  Pager: [phone] Office: [phone]

## 2022-01-08 NOTE — Progress Notes (Signed)
Daily Progress Note Intern Pager: [phone]  Patient name: Kristin [name] Medical record number: [id number] Date of birth: Aug 03, 1975 Age: 46 y.o. Gender: female  Primary Care Provider: Alen Bleacher, MD Consultants: Cardiology Code Status: Full  Pt Overview and Major Events to Date:  10/27 admitted, echo with EF 25-30% ? Takotsubo, troponin remain elevated  Assessment and Plan:  Kristin [name] is a 46 y.o. female presenting with shortness of breath found acute CHF exacerbation with acute hypoxemic respiratory failure, possible CAP, elevated troponins.  Pertinent PMH includes HTN, alcohol/cocaine use, bipolar disorder. * Acute HFrEF (heart failure with reduced ejection fraction) ([place]) New onset HFrEF initially with acute hypoxemic respiratory failure.  EF 25-30% with ?concern for Takotsubo.  Improving with diuresis, no longer requiring oxygen.  CTA negative for PE.  Diuresed 1.25 L overnight. - Cardiac Telemetry - Cardiology consulted, appreciate care and recommendations - Continue diuresis: Lasix 40mg  IV BID - Spironolactone 12.5 mg daily - Strict I/Os, Daily weights - F/u CBC,CMP  Community acquired pneumonia CTA with findings suggestive of possible multifocal pneumonia.  Reassuringly has remained afebrile without leukocytosis.  Continues to have cough this morning. -Continue ceftriaxone (Day 3/5), azithromycin (10/27-10/28) -Continue Tessalon, Tylenol, Zofran as needed  Elevated troponin Peaked at 1295, most recently 1158. Likely demand ischemia in the setting of acute CHF. ?Takotsubo per echo. Patient denies chest pain currently. -Continue heparin gtt -Cardiology consulted, appreciate recommendations (possible cath on Monday 10/30) -Tele monitoring -Repeat EKG if having chest pain  Substance abuse ([place]) Alcohol abuse per chart review. UDS + cocaine on admission. Denies h/o withdrawal. AST elevated to 161 on admission. Ethanol level <10. CIWA 0.  -Thiamine, folate,  multivitamin -Monitor clinically for signs of withdrawal   Chronic anemia Normocytic anemia, likely anemia of chronic disease. Hgb 10-11 at baseline, today 7.5. Stable. Denies any abdominal pain. -F/u CBC -D/C CIWA   FEN/GI: Heart healthy/carb modified PPx: Protonix, famotidine Dispo: Pending clinical improvement, possible cardiac cath on Monday  Subjective:  Patient says she still having a good amount of coughing.  Says that she her chest is sore but denies any chest pain.  Objective: Temp:  [98.2 F (36.8 C)-99.4 F (37.4 C)] 98.9 F (37.2 C) (10/29 0504) Pulse Rate:  [110-120] 110 (10/29 0840) Resp:  [18-28] 20 (10/29 0840) BP: (91-118)/(72-95) 118/95 (10/29 0840) SpO2:  [91 %-100 %] 100 % (10/29 0840) Weight:  [76.8 kg] 76.8 kg (10/29 0504) Physical Exam: General: NAD, sitting up in bed, coughing Cardiovascular: Tachycardic, regular rhythm, no murmurs rubs or gallops Respiratory: Coarse crackles in posterior lung fields, no retractions on room air Abdomen: Soft, nontender to palpation Extremities: 1+ edema present bilaterally in lower extremities, improved from previous exams  Laboratory: Most recent CBC Lab Results  Component Value Date   WBC 6.7 01/08/2022   HGB 7.5 (L) 01/08/2022   HCT 21.4 (L) 01/08/2022   MCV 86.3 01/08/2022   PLT 163 01/08/2022   Most recent BMP    Latest Ref Rng & Units 01/08/2022    7:34 AM  BMP  Glucose 70 - 99 mg/dL 87   BUN 6 - 20 mg/dL 8   Creatinine 0.44 - 1.00 mg/dL 1.02   Sodium 135 - 145 mmol/L 136   Potassium 3.5 - 5.1 mmol/L 4.1   Chloride 98 - 111 mmol/L 105   CO2 22 - 32 mmol/L 21   Calcium 8.9 - 10.3 mg/dL 7.9     Imaging/Diagnostic Tests: No results found.  Gerrit Heck, MD 01/08/2022, 9:00 AM  PGY-2, [place] Intern pager: [phone], text pages welcome Secure chat group [place]

## 2022-01-08 NOTE — Progress Notes (Signed)
   01/08/22 1100  Mobility  Activity Ambulated with assistance in hallway  Level of Assistance Standby assist, set-up cues, supervision of patient - no hands on  Assistive Device Cane  Distance Ambulated (ft) 150 ft  Activity Response Tolerated well  Mobility Referral Yes  $Mobility charge 1 Mobility   Mobility Specialist Progress Note  During Mobility: 85-93% SpO2 Post-Mobility: 93% SpO2  Pt was in bed and agreeable. X2 standing breaks d/t SOB. Recovered after being coached through pursed lip breathing. Returned to bed w/ all needs met and call bell in reach.  Lucious Groves Mobility Specialist

## 2022-01-08 NOTE — Social Work (Signed)
CSW was alerted was pt wanted to speak with CSW in regards to housing resources. CSW met with pt and provided her with housing resources. Pt also asked CS about disability and explained that she had already applied. CSW suggested that pt follow up with SS or should could secure a lawyer to assist with that process if that was her preference.

## 2022-01-08 NOTE — Progress Notes (Signed)
ANTICOAGULATION CONSULT NOTE   Pharmacy Consult for heparin Indication: ACS  Allergies  Allergen Reactions   Sumatriptan Anaphylaxis    High blood pressure and numb   Other     Bananas-stomach cramps   Eggs Or Egg-Derived Products Rash and Other (See Comments)    Stomach cramps - " can't eat them fried"    Patient Measurements: Height: 5' 4.5" (163.8 cm) Weight: 76.8 kg (169 lb 5 oz) IBW/kg (Calculated) : 55.85 Heparin Dosing Weight: 72 kg   Vital Signs: Temp: 98.9 F (37.2 C) (10/29 0504) Temp Source: Oral (10/29 0504) BP: 108/91 (10/29 0032) Pulse Rate: 110 (10/29 0504)  Labs: Recent Labs    01/05/22 1754 01/05/22 2220 01/05/22 2314 01/06/22 0500 01/06/22 0520 01/06/22 1349 01/06/22 1530 01/06/22 2053 01/06/22 2200 01/07/22 0953 01/07/22 0958 01/08/22 0053 01/08/22 0734  HGB 9.1*  --  9.5*  --   --   --   --   --   --  8.4*  --   --  7.5*  HCT 26.4*  --  28.0*  --   --   --   --   --   --  25.0*  --   --  21.4*  PLT 286  --   --   --   --   --   --   --   --  199  --   --  163  HEPARINUNFRC  --   --   --   --   --    < > 0.37 0.45  --   --  0.49 0.37  --   CREATININE 0.79  --   --   --  0.93  --   --   --   --  1.08*  --   --   --   TROPONINIHS 555*   < >  --    < > 940*   < > 1,235* 1,295* 1,158*  --   --   --   --    < > = values in this interval not displayed.     Estimated Creatinine Clearance: 65.4 mL/min (A) (by C-G formula based on SCr of 1.08 mg/dL (H)).   Medical History: Past Medical History:  Diagnosis Date   Acid reflux    ACL tear 2011   not sure which side   Acute lateral meniscus tear of right knee    Anxiety    Arthritis    Back pain    slipped disc lower back lumbar   Bipolar 1 disorder (HCC)    Depression    Dysfunctional uterine bleeding    Eczema    Hypertension    Insomnia    Migraine headache    Neuromuscular disorder (HCC)    leg cramps    Assessment: 46 year old female with history of hypertension comes in with  3-day history of worsening shortness of breath. She has noted leg swelling. EKG not showing ischemia, and PE study pending. No anticoagulation reported prior to admission. Pharmacy to dose heparin.  Heparin level 0.37 on drip rate 1100 units/hr therapeutic. Heparin level continues to be therapeutic. Hgb 7.5 and plt 163 have been trending down. No s/sx of bleeding or issues with infusion per RN. Cardiology has plan for cath 10/30.   Goal of Therapy:  Heparin level 0.3-0.7 units/ml Monitor platelets by anticoagulation protocol: Yes   Plan:  Continue heparin gtt at 1100 units/hr Daily heparin level, CBC, s/sx bleeding Plan for cath Monday  Jerry Caras, PharmD PGY1 Pharmacy Resident   01/08/2022 7:57 AM

## 2022-01-09 ENCOUNTER — Inpatient Hospital Stay (HOSPITAL_COMMUNITY): Payer: Commercial Managed Care - HMO

## 2022-01-09 ENCOUNTER — Encounter (HOSPITAL_COMMUNITY): Payer: Self-pay | Admitting: Cardiology

## 2022-01-09 ENCOUNTER — Telehealth (HOSPITAL_COMMUNITY): Payer: Self-pay

## 2022-01-09 ENCOUNTER — Encounter (HOSPITAL_COMMUNITY)
Admission: EM | Disposition: A | Discharge status: Home / Self Care | Payer: Self-pay | Source: Home / Self Care | Attending: Family Medicine

## 2022-01-09 ENCOUNTER — Other Ambulatory Visit (HOSPITAL_COMMUNITY): Payer: Self-pay

## 2022-01-09 DIAGNOSIS — I5021 Acute systolic (congestive) heart failure: Secondary | ICD-10-CM | POA: Diagnosis not present

## 2022-01-09 DIAGNOSIS — I340 Nonrheumatic mitral (valve) insufficiency: Secondary | ICD-10-CM

## 2022-01-09 DIAGNOSIS — I361 Nonrheumatic tricuspid (valve) insufficiency: Secondary | ICD-10-CM | POA: Diagnosis not present

## 2022-01-09 DIAGNOSIS — J9601 Acute respiratory failure with hypoxia: Secondary | ICD-10-CM | POA: Diagnosis not present

## 2022-01-09 DIAGNOSIS — I509 Heart failure, unspecified: Secondary | ICD-10-CM | POA: Diagnosis not present

## 2022-01-09 DIAGNOSIS — F1910 Other psychoactive substance abuse, uncomplicated: Secondary | ICD-10-CM

## 2022-01-09 DIAGNOSIS — D649 Anemia, unspecified: Secondary | ICD-10-CM | POA: Diagnosis not present

## 2022-01-09 HISTORY — PX: RIGHT/LEFT HEART CATH AND CORONARY ANGIOGRAPHY: CATH118266

## 2022-01-09 LAB — POCT I-STAT EG7
Acid-base deficit: 1 mmol/L (ref 0.0–2.0)
Acid-base deficit: 1 mmol/L (ref 0.0–2.0)
Bicarbonate: 23.6 mmol/L (ref 20.0–28.0)
Bicarbonate: 23.9 mmol/L (ref 20.0–28.0)
Calcium, Ion: 1.14 mmol/L — ABNORMAL LOW (ref 1.15–1.40)
Calcium, Ion: 1.15 mmol/L (ref 1.15–1.40)
HCT: 22 % — ABNORMAL LOW (ref 36.0–46.0)
HCT: 22 % — ABNORMAL LOW (ref 36.0–46.0)
Hemoglobin: 7.5 g/dL — ABNORMAL LOW (ref 12.0–15.0)
Hemoglobin: 7.5 g/dL — ABNORMAL LOW (ref 12.0–15.0)
O2 Saturation: 53 %
O2 Saturation: 53 %
Potassium: 3.4 mmol/L — ABNORMAL LOW (ref 3.5–5.1)
Potassium: 3.5 mmol/L (ref 3.5–5.1)
Sodium: 139 mmol/L (ref 135–145)
Sodium: 139 mmol/L (ref 135–145)
TCO2: 25 mmol/L (ref 22–32)
TCO2: 25 mmol/L (ref 22–32)
pCO2, Ven: 39.3 mmHg — ABNORMAL LOW (ref 44–60)
pCO2, Ven: 40 mmHg — ABNORMAL LOW (ref 44–60)
pH, Ven: 7.385 (ref 7.25–7.43)
pH, Ven: 7.386 (ref 7.25–7.43)
pO2, Ven: 29 mmHg — CL (ref 32–45)
pO2, Ven: 29 mmHg — CL (ref 32–45)

## 2022-01-09 LAB — CBC
HCT: 23.7 % — ABNORMAL LOW (ref 36.0–46.0)
Hemoglobin: 8 g/dL — ABNORMAL LOW (ref 12.0–15.0)
MCH: 29.7 pg (ref 26.0–34.0)
MCHC: 33.8 g/dL (ref 30.0–36.0)
MCV: 88.1 fL (ref 80.0–100.0)
Platelets: 186 10*3/uL (ref 150–400)
RBC: 2.69 MIL/uL — ABNORMAL LOW (ref 3.87–5.11)
RDW: 18.9 % — ABNORMAL HIGH (ref 11.5–15.5)
WBC: 8.2 10*3/uL (ref 4.0–10.5)
nRBC: 0.4 % — ABNORMAL HIGH (ref 0.0–0.2)

## 2022-01-09 LAB — BASIC METABOLIC PANEL
Anion gap: 11 (ref 5–15)
BUN: 7 mg/dL (ref 6–20)
CO2: 23 mmol/L (ref 22–32)
Calcium: 8.2 mg/dL — ABNORMAL LOW (ref 8.9–10.3)
Chloride: 104 mmol/L (ref 98–111)
Creatinine, Ser: 1.17 mg/dL — ABNORMAL HIGH (ref 0.44–1.00)
GFR, Estimated: 58 mL/min — ABNORMAL LOW (ref >60)
Glucose, Bld: 101 mg/dL — ABNORMAL HIGH (ref 70–99)
Potassium: 3.8 mmol/L (ref 3.5–5.1)
Sodium: 138 mmol/L (ref 135–145)

## 2022-01-09 LAB — IRON AND TIBC
Iron: 57 ug/dL (ref 28–170)
Saturation Ratios: 52 % — ABNORMAL HIGH (ref 10.4–31.8)
TIBC: 109 ug/dL — ABNORMAL LOW (ref 250–450)
UIBC: 52 ug/dL

## 2022-01-09 LAB — MAGNESIUM: Magnesium: 1.5 mg/dL — ABNORMAL LOW (ref 1.7–2.4)

## 2022-01-09 LAB — POCT I-STAT 7, (LYTES, BLD GAS, ICA,H+H)
Acid-base deficit: 2 mmol/L (ref 0.0–2.0)
Bicarbonate: 22 mmol/L (ref 20.0–28.0)
Calcium, Ion: 1.13 mmol/L — ABNORMAL LOW (ref 1.15–1.40)
HCT: 22 % — ABNORMAL LOW (ref 36.0–46.0)
Hemoglobin: 7.5 g/dL — ABNORMAL LOW (ref 12.0–15.0)
O2 Saturation: 91 %
Potassium: 3.4 mmol/L — ABNORMAL LOW (ref 3.5–5.1)
Sodium: 139 mmol/L (ref 135–145)
TCO2: 23 mmol/L (ref 22–32)
pCO2 arterial: 34.2 mmHg (ref 32–48)
pH, Arterial: 7.415 (ref 7.35–7.45)
pO2, Arterial: 60 mmHg — ABNORMAL LOW (ref 83–108)

## 2022-01-09 LAB — HEPARIN LEVEL (UNFRACTIONATED): Heparin Unfractionated: 0.32 IU/mL (ref 0.30–0.70)

## 2022-01-09 LAB — BRAIN NATRIURETIC PEPTIDE: B Natriuretic Peptide: 770.9 pg/mL — ABNORMAL HIGH (ref 0.0–100.0)

## 2022-01-09 LAB — FERRITIN: Ferritin: 725 ng/mL — ABNORMAL HIGH (ref 11–307)

## 2022-01-09 SURGERY — RIGHT/LEFT HEART CATH AND CORONARY ANGIOGRAPHY
Anesthesia: LOCAL

## 2022-01-09 MED ORDER — ACETAMINOPHEN 325 MG PO TABS: 650.0000 mg | ORAL_TABLET | ORAL | Status: DC | PRN

## 2022-01-09 MED ORDER — VERAPAMIL HCL 2.5 MG/ML IV SOLN
INTRAVENOUS | Status: DC | PRN
  Administered 2022-01-09: 10 mL via INTRA_ARTERIAL

## 2022-01-09 MED ORDER — SODIUM CHLORIDE 0.9 % IV SOLN: INTRAVENOUS | Status: DC

## 2022-01-09 MED ORDER — HEPARIN SODIUM (PORCINE) 1000 UNIT/ML IJ SOLN
INTRAMUSCULAR | Status: AC
  Filled 2022-01-09: qty 10

## 2022-01-09 MED ORDER — FENTANYL CITRATE (PF) 100 MCG/2ML IJ SOLN
INTRAMUSCULAR | Status: DC | PRN
  Administered 2022-01-09 (×2): 25 ug via INTRAVENOUS

## 2022-01-09 MED ORDER — MAGNESIUM SULFATE 50 % IJ SOLN
2.0000 g | Freq: Once | INTRAMUSCULAR | Status: DC
  Filled 2022-01-09 (×2): qty 4

## 2022-01-09 MED ORDER — LABETALOL HCL 5 MG/ML IV SOLN: 10.0000 mg | INTRAVENOUS | Status: AC | PRN

## 2022-01-09 MED ORDER — SODIUM CHLORIDE 0.9% FLUSH: 3.0000 mL | INTRAVENOUS | Status: DC | PRN

## 2022-01-09 MED ORDER — SODIUM CHLORIDE 0.9 % IV SOLN: 250.0000 mL | INTRAVENOUS | Status: DC | PRN

## 2022-01-09 MED ORDER — HEPARIN SODIUM (PORCINE) 1000 UNIT/ML IJ SOLN
INTRAMUSCULAR | Status: DC | PRN
  Administered 2022-01-09: 4000 [IU] via INTRAVENOUS

## 2022-01-09 MED ORDER — LIDOCAINE HCL (PF) 1 % IJ SOLN
INTRAMUSCULAR | Status: DC | PRN
  Administered 2022-01-09 (×2): 5 mL via INTRADERMAL

## 2022-01-09 MED ORDER — PANTOPRAZOLE SODIUM 40 MG PO TBEC
40.0000 mg | DELAYED_RELEASE_TABLET | Freq: Every day | ORAL | Status: DC
  Administered 2022-01-10 – 2022-01-12 (×3): 40 mg via ORAL
  Filled 2022-01-09 (×3): qty 1

## 2022-01-09 MED ORDER — SODIUM CHLORIDE 0.9% FLUSH
3.0000 mL | Freq: Two times a day (BID) | INTRAVENOUS | Status: DC
  Administered 2022-01-09 – 2022-01-12 (×6): 3 mL via INTRAVENOUS

## 2022-01-09 MED ORDER — IOHEXOL 350 MG/ML SOLN
INTRAVENOUS | Status: DC | PRN
  Administered 2022-01-09: 30 mL

## 2022-01-09 MED ORDER — SODIUM CHLORIDE 0.9% FLUSH: 3.0000 mL | Freq: Two times a day (BID) | INTRAVENOUS | Status: DC

## 2022-01-09 MED ORDER — HEPARIN (PORCINE) IN NACL 1000-0.9 UT/500ML-% IV SOLN
INTRAVENOUS | Status: DC | PRN
  Administered 2022-01-09 (×2): 500 mL

## 2022-01-09 MED ORDER — HYDRALAZINE HCL 20 MG/ML IJ SOLN: 10.0000 mg | INTRAMUSCULAR | Status: AC | PRN

## 2022-01-09 MED ORDER — ENOXAPARIN SODIUM 40 MG/0.4ML IJ SOSY: 40.0000 mg | PREFILLED_SYRINGE | Freq: Every day | INTRAMUSCULAR | Status: DC

## 2022-01-09 MED ORDER — VERAPAMIL HCL 2.5 MG/ML IV SOLN
INTRAVENOUS | Status: AC
  Filled 2022-01-09: qty 2

## 2022-01-09 MED ORDER — ASPIRIN 81 MG PO CHEW
81.0000 mg | CHEWABLE_TABLET | ORAL | Status: AC
  Administered 2022-01-09: 81 mg via ORAL
  Filled 2022-01-09: qty 1

## 2022-01-09 MED ORDER — HEPARIN (PORCINE) IN NACL 1000-0.9 UT/500ML-% IV SOLN
INTRAVENOUS | Status: AC
  Filled 2022-01-09: qty 500

## 2022-01-09 MED ORDER — MIDAZOLAM HCL 2 MG/2ML IJ SOLN
INTRAMUSCULAR | Status: AC
  Filled 2022-01-09: qty 2

## 2022-01-09 MED ORDER — LORAZEPAM 0.5 MG PO TABS
0.5000 mg | ORAL_TABLET | Freq: Once | ORAL | Status: AC
  Administered 2022-01-09: 0.5 mg via ORAL
  Filled 2022-01-09: qty 1

## 2022-01-09 MED ORDER — MAGNESIUM SULFATE 2 GM/50ML IV SOLN
2.0000 g | Freq: Once | INTRAVENOUS | Status: AC
  Administered 2022-01-09: 2 g via INTRAVENOUS
  Filled 2022-01-09: qty 50

## 2022-01-09 MED ORDER — GADOBUTROL 1 MMOL/ML IV SOLN
10.0000 mL | Freq: Once | INTRAVENOUS | Status: AC | PRN
  Administered 2022-01-09: 10 mL via INTRAVENOUS

## 2022-01-09 MED ORDER — LIDOCAINE HCL (PF) 1 % IJ SOLN
INTRAMUSCULAR | Status: AC
  Filled 2022-01-09: qty 30

## 2022-01-09 MED ORDER — FENTANYL CITRATE (PF) 100 MCG/2ML IJ SOLN
INTRAMUSCULAR | Status: AC
  Filled 2022-01-09: qty 2

## 2022-01-09 MED ORDER — ONDANSETRON HCL 4 MG/2ML IJ SOLN: 4.0000 mg | Freq: Four times a day (QID) | INTRAMUSCULAR | Status: DC | PRN

## 2022-01-09 MED ORDER — ENOXAPARIN SODIUM 40 MG/0.4ML IJ SOSY
40.0000 mg | PREFILLED_SYRINGE | INTRAMUSCULAR | Status: DC
  Administered 2022-01-09 – 2022-01-10 (×2): 40 mg via SUBCUTANEOUS
  Filled 2022-01-09 (×2): qty 0.4

## 2022-01-09 MED ORDER — MIDAZOLAM HCL 2 MG/2ML IJ SOLN
INTRAMUSCULAR | Status: DC | PRN
  Administered 2022-01-09 (×2): 1 mg via INTRAVENOUS

## 2022-01-09 SURGICAL SUPPLY — 15 items
CATH OPTITORQUE TIG 4.0 5F (CATHETERS) IMPLANT
CATH SWAN GANZ 7F STRAIGHT (CATHETERS) IMPLANT
GLIDESHEATH SLEND A-KIT 6F 22G (SHEATH) IMPLANT
GUIDEWIRE .025 260CM (WIRE) IMPLANT
GUIDEWIRE INQWIRE 1.5J.035X260 (WIRE) IMPLANT
INQWIRE 1.5J .035X260CM (WIRE) ×1
KIT HEART LEFT (KITS) ×1 IMPLANT
KIT MICROPUNCTURE NIT STIFF (SHEATH) IMPLANT
PACK CARDIAC CATHETERIZATION (CUSTOM PROCEDURE TRAY) ×1 IMPLANT
SHEATH GLIDE SLENDER 4/5FR (SHEATH) IMPLANT
SHEATH PINNACLE 5F 10CM (SHEATH) IMPLANT
SHEATH PINNACLE 7F 10CM (SHEATH) IMPLANT
SHEATH PROBE COVER 6X72 (BAG) IMPLANT
TRANSDUCER W/STOPCOCK (MISCELLANEOUS) ×1 IMPLANT
TUBING CIL FLEX 10 FLL-RA (TUBING) ×1 IMPLANT

## 2022-01-09 NOTE — H&P (Signed)
BIPAP initiated in ER. No distress at this time. Not needed.

## 2022-01-09 NOTE — Progress Notes (Signed)
Bipap not indicated at this time. 

## 2022-01-09 NOTE — Progress Notes (Signed)
Daily Progress Note Intern Pager: [phone]  Patient name: Kristin [name] Medical record number: [id number] Date of birth: Jun 09, 1975 Age: 46 y.o. Gender: female  Primary Care Provider: Jerre Simon, MD Consultants: Cardiology Code Status: Full  Pt Overview and Major Events to Date:  10/27 admitted, echo with EF 25-30% ? Takotsubo, troponin remain elevated 10/30 Catheterization  Assessment and Plan:  Kristin [name] is a 46 y.o. female presenting with shortness of breath found acute CHF exacerbation with acute hypoxemic respiratory failure, possible CAP, elevated troponins.  Pertinent PMH includes HTN, alcohol/cocaine use, bipolar disorder. * Acute HFrEF (heart failure with reduced ejection fraction) (HCC) New onset HFrEF initially with acute hypoxemic respiratory failure.  EF 25-30% with ?concern for Takotsubo.  Improving with diuresis, no longer requiring oxygen.  CTA negative for PE. Underwent catheterization today, found to have mildly decompensated nonischemic cardiomyopathy, unclear if substance induced or RSV myocarditis. Cards recommending cardiac MRI for further work up. - Cardiac Telemetry - Cardiology consulted, appreciate care and recommendations - F/u cardiac MRI results - Continue diuresis: Lasix 40mg  IV daily - Spironolactone 12.5 mg daily - Strict I/Os, Daily weights - F/u CBC,CMP   Community acquired pneumonia CTA with findings suggestive of possible multifocal pneumonia 2/2 to now suspected RSV infection.  Reassuringly has remained afebrile without leukocytosis. Continues to have SOB this morning on 3L via [place].  - D/c ceftriaxone -Continue Tessalon, Tylenol, Zofran as needed  Elevated troponin Flat, . - Cards following, appreciate recs  -D/c heparin gtt -Tele monitoring -Repeat EKG if having chest pain  Substance abuse (HCC) Alcohol abuse per chart review. UDS + cocaine on admission. Denies h/o withdrawal. AST elevated to 161 on admission.  Ethanol level <10. CIWA 0.  -Thiamine, folate, multivitamin -Monitor clinically for signs of withdrawal   Chronic anemia Anemia of chronic disease w/ Ferritin 725, TIBC 109 Sat 52, Iron WNL. Hgb 10-11 at baseline, today 8.0. Remains stable.  -F/u CBC -D/C CIWA   FEN/GI: Heart healthy/carb modified PPx: Protonix, famotidine Dispo: Pending clinical improvement, possible cardiac cath on Monday  Subjective:  Patient at bedside, continues to endorse SOB but overall feels better. Has no acute complaints otherwise.   Objective: Temp:  [98.4 F (36.9 C)-98.5 F (36.9 C)] 98.5 F (36.9 C) (10/30 0700) Pulse Rate:  [85-115] 104 (10/30 0824) Resp:  [16-37] 32 (10/30 0824) BP: (94-125)/(76-97) 110/83 (10/30 0824) SpO2:  [92 %-100 %] 95 % (10/30 0824) Physical Exam: General: NAD, sitting up in bed, coughing Cardiovascular: Tachycardic, regular rhythm, no murmurs rubs or gallops Respiratory: Coarse crackles in posterior lung fields, more prominent on LLL no retractions. On 3L [place] Abdomen: Soft, nontender to palpation Extremities: Nonpitting edema present bilaterally in lower extremities, improved   Laboratory: Most recent CBC Lab Results  Component Value Date   WBC 8.2 01/09/2022   HGB 8.0 (L) 01/09/2022   HCT 23.7 (L) 01/09/2022   MCV 88.1 01/09/2022   PLT 186 01/09/2022   Most recent BMP    Latest Ref Rng & Units 01/09/2022    6:25 AM  BMP  Glucose 70 - 99 mg/dL 01/11/2022   BUN 6 - 20 mg/dL 7   Creatinine 371 - 6.96 mg/dL 7.89   Sodium 3.81 - 017 mmol/L 138   Potassium 3.5 - 5.1 mmol/L 3.8   Chloride 98 - 111 mmol/L 104   CO2 22 - 32 mmol/L 23   Calcium 8.9 - 10.3 mg/dL 8.2     Imaging/Diagnostic Tests: CARDIAC CATHETERIZATION  Result Date: 01/09/2022 Images from the original result were not included. Right dominant circulation. No significant coronary artery disease. RA: 9 mmHg RV: 37/4 mmHg PA: 38/16 mmHg, mPAP 26 mmHg PCW: 15 mmHg CO: 6.0 L/min CI: 3.3 L./min/m2 Mildly  decompensated nonischemic cardiomyopathy Mild PH, WHO grp II Differentials for cardiomyopathy include alcohol, cocaine, as well as RSV causing viral myocarditis. Will try to get cardiac MRI. Nigel Mormon, MD Pager: [phone] Office: [phone]     Christene Slates, MD 01/09/2022, 12:43 PM  PGY-1, [place] Intern pager: [phone], text pages welcome Secure chat group [place]

## 2022-01-09 NOTE — Interval H&P Note (Signed)
History and Physical Interval Note:  01/09/2022 7:37 AM  Kristin [name]  has presented today for surgery, with the diagnosis of NSTEMI.  The various methods of treatment have been discussed with the patient and family. After consideration of risks, benefits and other options for treatment, the patient has consented to  Procedure(s): RIGHT/LEFT HEART CATH AND CORONARY ANGIOGRAPHY (N/A) as a surgical intervention.  The patient's history has been reviewed, patient examined, no change in status, stable for surgery.  I have reviewed the patient's chart and labs.  Questions were answered to the patient's satisfaction.    2012 Appropriate Use Criteria for Diagnostic Catheterization Cardiomyopathies (Right and Left Heart Catheterization OR Right Heart Catheterization Alone With/Without Left Ventriculography and Coronary Angiography) Indication:  Known or suspected cardiomyopathy with or without heart failure A (7) Indication: 93; Score 7   [name]

## 2022-01-09 NOTE — Progress Notes (Signed)
Initial Nutrition Assessment  DOCUMENTATION CODES:   Not applicable  INTERVENTION:  - Patient did not want to receive nutrition supplements. Ensure Enlive BID if intake becomes poor. - Daily multivitamin with minerals, thiamine, and folic acid (per MD) to support micronutrient needs.   NUTRITION DIAGNOSIS:   Inadequate oral intake related to poor appetite as evidenced by 17% percent weight loss in 8 months.  GOAL:   Patient will meet greater than or equal to 90% of their needs  MONITOR:   PO intake, Weight trends  REASON FOR ASSESSMENT:   Malnutrition Screening Tool    ASSESSMENT:   46 y.o. female admits related to SOB. PMH includes: anxiety, bipolar, HTN, cocaine and alcohol abuse. Pt is currently receiving management for acute HFrEF.  Pt asleep at time of visit, awoke to sound of voice. She reports UBW of 210# and feels she weighed this within the past year. Does note a more recent UBW of 160-170#. Per chart review of previous documented weights, patient has not weighed 200# since 2018. However, patient reports weight loss within the past year and is noted to have had a possible 32# or 17% weight loss in the past 8 months. Patient is also noted to have presented with bilateral lower leg swelling which could be affecting current weights. Patient endorses typically eating 1 meal a day at home, usually dinner consisting of a protein and vegetables. Notes her appetite has been decreased recently as she has been under stress and she also has acid reflux which sometimes hinders her intake. Admits some days she has nothing to eat. Thankfully, she reports eating well this admission with good appetite. She does not want to try nutrition supplements, stressed importance of eating well during admission and once back at home. Reports some diarrhea she feels is improving. Of note, patient reports her hair has started to thin which is new for her. Pt endorses ambulating well at home, uses a  cane.  Medications reviewed and include: Folic acid, Multivitamins with minerals, Thiamine, Lasix  Labs reviewed:  Creatinine 1.17 Magnesium 1.7   NUTRITION - FOCUSED PHYSICAL EXAM:  Flowsheet Row Most Recent Value  Orbital Region No depletion  Upper Arm Region Mild depletion  Thoracic and Lumbar Region No depletion  Buccal Region No depletion  Temple Region Moderate depletion  Clavicle Bone Region Mild depletion  Clavicle and Acromion Bone Region Mild depletion  Scapular Bone Region Unable to assess  Dorsal Hand Mild depletion  Patellar Region Moderate depletion  Anterior Thigh Region Moderate depletion  Posterior Calf Region Moderate depletion  Edema (RD Assessment) None  Hair Unable to assess  [pt wearing wig]  Eyes Reviewed  Mouth Reviewed  Skin Reviewed  Nails Reviewed       Diet Order:   Diet Order             Diet regular Room service appropriate? Yes; Fluid consistency: Thin  Diet effective now                   EDUCATION NEEDS:   No education needs have been identified at this time  Skin:  Skin Assessment: Reviewed RN Assessment  Last BM:  01/08/22  Height:   Ht Readings from Last 1 Encounters:  01/05/22 5' 4.5" (1.638 m)    Weight:   Wt Readings from Last 1 Encounters:  01/08/22 76.8 kg    Ideal Body Weight:  55.7 kg  BMI:  Body mass index is 28.61 kg/m.  Estimated Nutritional Needs:  Kcal:  1900-2300 kcals  Protein:  92-115 gm  Fluid:  1900-2300 mL    Shelle Iron RD, LDN For contact information, refer to [facility].

## 2022-01-09 NOTE — Telephone Encounter (Signed)
Pharmacy Patient Advocate Encounter  Insurance verification completed.    The patient is insured through [place]   The patient is currently admitted and ran test claims for the following: Farxiga, Clinical cytogeneticist.  Copays and coinsurance results were relayed to Inpatient clinical team.

## 2022-01-09 NOTE — TOC Benefit Eligibility Note (Signed)
Patient Teacher, English as a foreign language completed.    The patient is currently admitted and upon discharge could be taking Farxiga 10 mg.  The current 30 day co-pay is $10.00.   The patient is currently admitted and upon discharge could be taking Corlanor 5mg .  Product not on formulary  The patient is insured through [place], [place] Patient Advocate Specialist [place] Patient Advocate Team Direct Number: [phone] Fax: [phone]

## 2022-01-10 ENCOUNTER — Inpatient Hospital Stay (HOSPITAL_COMMUNITY): Payer: Commercial Managed Care - HMO

## 2022-01-10 DIAGNOSIS — I5181 Takotsubo syndrome: Secondary | ICD-10-CM

## 2022-01-10 DIAGNOSIS — D649 Anemia, unspecified: Secondary | ICD-10-CM | POA: Diagnosis not present

## 2022-01-10 DIAGNOSIS — R5381 Other malaise: Secondary | ICD-10-CM

## 2022-01-10 DIAGNOSIS — I5021 Acute systolic (congestive) heart failure: Secondary | ICD-10-CM | POA: Diagnosis not present

## 2022-01-10 DIAGNOSIS — J9601 Acute respiratory failure with hypoxia: Secondary | ICD-10-CM | POA: Diagnosis not present

## 2022-01-10 LAB — CBC
HCT: 26.7 % — ABNORMAL LOW (ref 36.0–46.0)
Hemoglobin: 9.1 g/dL — ABNORMAL LOW (ref 12.0–15.0)
MCH: 29.5 pg (ref 26.0–34.0)
MCHC: 34.1 g/dL (ref 30.0–36.0)
MCV: 86.7 fL (ref 80.0–100.0)
Platelets: 198 10*3/uL (ref 150–400)
RBC: 3.08 MIL/uL — ABNORMAL LOW (ref 3.87–5.11)
RDW: 19.7 % — ABNORMAL HIGH (ref 11.5–15.5)
WBC: 7.5 10*3/uL (ref 4.0–10.5)
nRBC: 0 % (ref 0.0–0.2)

## 2022-01-10 LAB — BASIC METABOLIC PANEL
Anion gap: 15 (ref 5–15)
BUN: 5 mg/dL — ABNORMAL LOW (ref 6–20)
CO2: 22 mmol/L (ref 22–32)
Calcium: 8.9 mg/dL (ref 8.9–10.3)
Chloride: 103 mmol/L (ref 98–111)
Creatinine, Ser: 1.05 mg/dL — ABNORMAL HIGH (ref 0.44–1.00)
GFR, Estimated: >60 mL/min (ref >60)
Glucose, Bld: 89 mg/dL (ref 70–99)
Potassium: 3.5 mmol/L (ref 3.5–5.1)
Sodium: 140 mmol/L (ref 135–145)

## 2022-01-10 MED ORDER — CARVEDILOL 3.125 MG PO TABS
3.1250 mg | ORAL_TABLET | Freq: Two times a day (BID) | ORAL | Status: DC
  Administered 2022-01-10 – 2022-01-12 (×5): 3.125 mg via ORAL
  Filled 2022-01-10 (×5): qty 1

## 2022-01-10 NOTE — Assessment & Plan Note (Deleted)
Suspect deconditioning given prolonged hospital course complicated by CHF exacerbation and RSV.  She is notably short of breath when ambulating short distances, improved from to requiring 3 L to 2 L of oxygen. No recommendations from PT -PT/OT consult

## 2022-01-10 NOTE — Progress Notes (Signed)
Takotsubo cardiomyopathy Added coreg 3.125 mg bid (Okay to use with cocaine given apha beta blockade) Ok to skip ivabradine on discharge   Nigel Mormon, MD Pager: [phone] Office: [phone]

## 2022-01-10 NOTE — Evaluation (Signed)
Physical Therapy Evaluation Patient Details Name: Kristin [name] MRN: [id number] DOB: [birth year] Today's Date: 01/10/2022  History of Present Illness  Pt is 46 yo female admitted 01/05/22 with CHF exacerbation and RSV with possible CAP.  Pt with hx of HTN, anemia, ETOH and cocaine abuse.  Clinical Impression  Pt admitted with above diagnosis. 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  Today, pt with limited cardiopulmonary endurance and requiring O2.  She does present with bil LE weakness but reports some of which is normal due to chronic low back pain/pinched nerve in lumbar region.  Pt has tried outpt PT in the past for her back but reports her pain increased.  Pt does have a flight of steps to enter her home.  With decreased endurance and O2 need pt would benefit from PT services.  Pt could also benefit from rollator to allow for rest breaks with community ambulation. Pt currently with functional limitations due to the deficits listed below (see PT Problem List). Pt will benefit from skilled PT to increase their independence and safety with mobility to allow discharge to the venue listed below.          Recommendations for follow up therapy are one component of a multi-disciplinary discharge planning process, led by the attending physician.  Recommendations may be updated based on patient status, additional functional criteria and insurance authorization.  Follow Up Recommendations No PT follow up (pt declined outpt PT for back issues - reports has tried in past)      Assistance Recommended at Discharge PRN  Patient can return home with the following  Assistance with cooking/housework;Help with stairs or ramp for entrance    Equipment Recommendations Rollator (4 wheels)  Recommendations for Other Services       Functional Status Assessment Patient has had a  recent decline in their functional status and demonstrates the ability to make significant improvements in function in a reasonable and predictable amount of time.     Precautions / Restrictions Precautions Precautions: None      Mobility  Bed Mobility Overal bed mobility: Needs Assistance Bed Mobility: Supine to Sit, Sit to Supine     Supine to sit: Modified independent (Device/Increase time), HOB elevated Sit to supine: Modified independent (Device/Increase time), HOB elevated        Transfers Overall transfer level: Needs assistance Equipment used: Straight cane Transfers: Sit to/from Stand Sit to Stand: Modified independent (Device/Increase time)           General transfer comment: Demonstrated safey; has been mobilizing in room on her own    Ambulation/Gait Ambulation/Gait assistance: Modified independent (Device/Increase time), Supervision Gait Distance (Feet): 200 Feet Assistive device: Straight cane Gait Pattern/deviations: Step-to pattern, Decreased stance time - left, Antalgic Gait velocity: decreased     General Gait Details: Pt ambulating in room at arrival.  She demonstrated hallway ambulation with cane and supervision.  Pt did experience shortness of breath and required O2 with 1 standing rest break.  Did not attempt on RA as RN reports tried earlier and sats significantly decreased.  Sats were 95% on 2 L O2.  Stairs            Wheelchair Mobility    Modified Rankin (Stroke Patients Only)       Balance Overall balance assessment: Needs assistance Sitting-balance support: No upper extremity supported Sitting balance-Leahy  Scale: Good     Standing balance support: No upper extremity supported Standing balance-Leahy Scale: Good                               Pertinent Vitals/Pain Pain Assessment Pain Assessment: Faces Faces Pain Scale: Hurts little more Pain Location: legs/back Pain Descriptors / Indicators:  Discomfort Pain Intervention(s): Monitored during session, Limited activity within patient's tolerance    Home Living Family/patient expects to be discharged to:: Private residence Living Arrangements: Parent;Children (back and forth between mom's and daughter's house; looking for a place of her own) Available Help at Discharge: Family;Available PRN/intermittently Type of Home:  (condo) Home Access: Stairs to enter Entrance Stairs-Rails: Right Entrance Stairs-Number of Steps: flight   Home Layout: One level Home Equipment: Cane - single point      Prior Function Prior Level of Function : Independent/Modified Independent;Working/employed             Mobility Comments: Uses cane for ambulation in home; in store uses grocery cart; reports issues with mobility from pinched nerve in lumbar region causing legs to lock up  as well as arthritis ADLs Comments: can do ADLs and IADLs (depends on the day for IADLs); pt does still work as in Pharmacist, hospital        Extremity/Trunk Assessment   Upper Extremity Assessment Upper Extremity Assessment: RUE deficits/detail;LUE deficits/detail RUE Deficits / Details: ROM WFL; MMT 4/5 LUE Deficits / Details: ROM WFL; MMT 4/5    Lower Extremity Assessment Lower Extremity Assessment: LLE deficits/detail;RLE deficits/detail (reports legs weak at baseline due to pinched nerve) RLE Deficits / Details: ROM WFL; MMT 3/5; + edema lower leg LLE Deficits / Details: ROM WFL; MMT 4/5; + edema lower leg    Cervical / Trunk Assessment Cervical / Trunk Assessment: Other exceptions Cervical / Trunk Exceptions: forward trunk; reports chronic pinched nerve in lumbar region  Communication   Communication: No difficulties  Cognition Arousal/Alertness: Awake/alert Behavior During Therapy: WFL for tasks assessed/performed Overall Cognitive Status: Within Functional Limits for tasks assessed                                           General Comments General comments (skin integrity, edema, etc.): Discussed with pt that balance is good with cane but could benefit from rollator for community ambulation to allow rest break.    Exercises     Assessment/Plan    PT Assessment Patient needs continued PT services  PT Problem List Decreased strength;Decreased mobility;Decreased activity tolerance;Cardiopulmonary status limiting activity       PT Treatment Interventions DME instruction;Therapeutic activities;Gait training;Therapeutic exercise;Patient/family education;Stair training;Balance training;Functional mobility training    PT Goals (Current goals can be found in the Care Plan section)  Acute Rehab PT Goals Patient Stated Goal: return home PT Goal Formulation: With patient Time For Goal Achievement: 01/24/22 Potential to Achieve Goals: Good Additional Goals Additional Goal #1: Will demonstrate safe use of rollator for rest breaks    Frequency Min 3X/week     Co-evaluation               AM-PAC PT "6 Clicks" Mobility  Outcome Measure Help needed turning from your back to your side while in a flat bed without using bedrails?: None Help needed moving from lying on your back  to sitting on the side of a flat bed without using bedrails?: None Help needed moving to and from a bed to a chair (including a wheelchair)?: None Help needed standing up from a chair using your arms (e.g., wheelchair or bedside chair)?: None Help needed to walk in hospital room?: None Help needed climbing 3-5 steps with a railing? : A Little 6 Click Score: 23    End of Session Equipment Utilized During Treatment: Oxygen;Gait belt Activity Tolerance: Patient tolerated treatment well Patient left: in bed;with call bell/phone within reach   PT Visit Diagnosis: Muscle weakness (generalized) (M62.81)    Time: 1411-1431 PT Time Calculation (min) (ACUTE ONLY): 20 min   Charges:   PT Evaluation $PT Eval Low Complexity: 1  Low          Connell Bognar, PT Acute Rehab [facility] [phone]   Karlton Lemon 01/10/2022, 3:29 PM

## 2022-01-10 NOTE — Progress Notes (Signed)
   01/10/22 1031  Mobility  Activity Ambulated with assistance in hallway  Level of Assistance Standby assist, set-up cues, supervision of patient - no hands on  Assistive Device Cane  Distance Ambulated (ft) 150 ft  Activity Response Tolerated well  Mobility Referral Yes  $Mobility charge 1 Mobility   Mobility Specialist Progress Note  Pt was in bed and agreeable.Had no c/o pain. Returned Eob w/ all needs met and call bell in reach.   Lucious Groves Mobility Specialist

## 2022-01-10 NOTE — Progress Notes (Addendum)
Subjective:  Feels okay Legs still swollen, but improving No chest pain Still requiring oxygen  Objective:  Vital Signs in the last 24 hours: Temp:  [98.1 F (36.7 C)-98.5 F (36.9 C)] 98.2 F (36.8 C) (10/31 1950) Pulse Rate:  [84-101] 84 (10/31 1649) Resp:  [16-20] 16 (10/31 1950) BP: (102-110)/(78-83) 102/78 (10/31 1649) SpO2:  [90 %-100 %] 98 % (10/31 1649) Weight:  [75.1 kg] 75.1 kg (10/31 0015)  Intake/Output from previous day: 10/30 0701 - 10/31 0700 In: 240 [P.O.:240] Out: -   Physical Exam Vitals and nursing note reviewed.  Constitutional:      General: She is not in acute distress. Neck:     Vascular: JVD present.  Cardiovascular:     Rate and Rhythm: Normal rate and regular rhythm.     Heart sounds: Normal heart sounds. No murmur heard. Pulmonary:     Effort: Pulmonary effort is normal.     Breath sounds: Normal breath sounds. No wheezing or rales.  Musculoskeletal:     Right lower leg: Edema (1+) present.     Left lower leg: Edema (1+) present.      Imaging/tests reviewed and independently interpreted: CXR 01/05/2022: New bibasilar patchy airspace disease with diffuse interstitial prominence. Imaging features may reflect dependent edema or bibasilar infection.    Cardiac Studies:  Cardiac MRI 01/09/2022: LVEF 40% Based on parametric mapping, differential may include LAD disease, myocarditis and stress cardiomyopathy: The apical rise in T1 values, higher ECV in the apex (rather that diffuse or inferoseptal) and apical hypokinesis favors stress induced cardiomyopathy or LAD disease.  RHC/LHC 01/06/2022: Right dominant circulation. No significant coronary artery disease.   RA: 9 mmHg RV: 37/4 mmHg PA: 38/16 mmHg, mPAP 26 mmHg PCW: 15 mmHg   CO: 6.0 L/min CI: 3.3 L./min/m2   Mildly decompensated nonischemic cardiomyopathy Mild PH, WHO grp II   Differentials for cardiomyopathy include alcohol, cocaine, as well as RSV causing viral  myocarditis. Will try to get cardiac MRI.    Echocardiogram 01/06/2022:  1. Consider Takotsubo.   2. Left ventricular ejection fraction, by estimation, is 25 to 30%. The  left ventricle has severely decreased function. The left ventricle  demonstrates regional wall motion abnormalities (see scoring  diagram/findings for description). Left ventricular  diastolic parameters are indeterminate.   3. Right ventricular systolic function is normal. The right ventricular  size is normal. There is normal pulmonary artery systolic pressure.   4. The mitral valve is normal in structure. No evidence of mitral valve  regurgitation. No evidence of mitral stenosis.   5. The aortic valve is tricuspid. Aortic valve regurgitation is not  visualized. No aortic stenosis is present.   6. The inferior vena cava is normal in size with greater than 50%  respiratory variability, suggesting right atrial pressure of 3 mmHg.   Comparison(s): Prior images reviewed side by side. The left ventricular  function is worsened.     EKG 01/05/2022: Sinus tachycardia 148 bpm Right atrial enlargement Nonspecific T wave abnormality Prolonged QT interval  Assessment & Recommendations:  46 year old African-American female with new diagnosis of acute systolic heart failure, acute hypoxic respiratory failure, alcohol dependence  Acute systolic heart failure: Reviewed clinical data, cardiac cathterization, echcoardiogram and MRI. Although she has h/o alcohol and cocaine abuse, has had RSV positive, findings most conistent with Takotsubo cardiomyopathy.  Although not completely benign, this is probably a better diagnosis for her to have. I expect gradual improvement in LVEF. Presently, her dyspnea and hypoxia is more  likely to be from recovering pneumonia than HFrEF. Continue losartan 12. Mg, Farxia 10 mg daily. Added coreg 3.125 mg bid (relatively dafer in the setting of cocaine use). Okay to discontinue Ivabradine  prior to discharge. Conitnue while inpatient.  Can switch IV lasix to PO lasix 40 mg daily on 11/1.  While cocaine and alcohol may not be the cause of her cardiomyopathy. Still, strongly advice against continued use of both.  Troponin elevation: Likely 2/2 heart failure.    Cardiology will sign off. Will arrange outpatient follow up.    Nigel Mormon, MD Pager: [phone] Office: [phone]

## 2022-01-10 NOTE — Hospital Course (Addendum)
Kristin [name] is a 46 y.o. female who presented with worsening SOB, cough, and BLE swelling and found to have new non-ischemic cardiomyopathy. PMHx significant for alcohol use, tobacco use, HTN, esophagitis, and bipolar disorder. A brief hospital course is below.    Non-ischemic cardiomyopathy Presented with SOB, cough, and BLE swelling. She was tachycardic, tachypneic, and hypertensive with 2+ BLE pitting edema on exam. Troponins and BNP were elevated. VBG with metabolic acidosis. ECG with sinus tach. CTA negative for PE but demonstrated bilateral infiltrates concerning for PNA vs edema. BiPAP, IV lasix, CTX, azithro, and heparin initiated. Abx d/c given improvement with diuresis and BiPAP. Cardiology consulted and recommended trending trops and echo. Echo demonstrated LVEF 25-30% with suspicion for Takotsubo. Trops peaked at 1295. Cardiac cath found moderate nonischemic CM. Cardiac MRI then obtained which showed likely Takotsubo CM. By discharge, ***.   Pneumonia CTA findings suggestive of possible multifocal pneumonia. She was initially placed on ceftriaxone and azithromycin. Abx d/c when found to be RSV positive. By discharge, ***.   Hypomagnesemia Mg 1.3 on admission; it was repleted over course of admission.  Other chronic conditions: Alcohol use: no signs of withdrawal; continued thiamine, folate, MVI over admission Normocytic anemia: stable over admission around baseline  Issues for follow up: Ensure ability to obtain and take new CHF medications: losartan, farxiga, and corlanor

## 2022-01-10 NOTE — Progress Notes (Addendum)
Daily Progress Note Intern Pager: [phone]  Patient name: Kristin [name] Medical record number: [id number] Date of birth: Apr 03, 1975 Age: 46 y.o. Gender: female  Primary Care Provider: Alen Bleacher, MD Consultants: Cardiology Code Status: Full  Pt Overview and Major Events to Date:  10/27 admitted, echo with EF 25-30% ? Takotsubo, troponin remain elevated 10/30 Catheterization  Assessment and Plan: Kristin [name] is a 46 y.o. female  with PMHx of HTN, alcohol, and cocaine use presenting with shortness of breath and found to have new onset CHF exacerbation with acute hypoxemic respiratory failure and RSV positive with possible CAP.   * Acute HFrEF (heart failure with reduced ejection fraction) (HCC) Cardiac MRI shows LVEF 40%, and apical hypokinesis with differential of myocarditis versus stress cardiomyopathy. On 3 L [place] today. No I/Os recorded, went to bathroom twice during evaluation.  Remains on IV Lasix with improvement of her creatinine today. Suspect she may still be volume overloaded, will confirm with repeat CXR. - Cardiac Telemetry - Cardiology consulted, appreciate care and recommendations - F/u CXR - Continue diuresis: Lasix 40mg  IV daily - Spironolactone 12.5 mg daily - Strict I/Os, Daily weights - F/u CBC,CMP  RSV (respiratory syncytial virus pneumonia) CTA with findings suggestive of possible multifocal pneumonia 2/2 to now suspected RSV infection.  Reassuringly has remained afebrile without leukocytosis. Continues to have SOB this morning on 3L via [place].  -Continue Tessalon, Tylenol, Zofran as needed  Substance abuse ([place]) Alcohol abuse per chart review. UDS + cocaine on admission. Denies h/o withdrawal. AST elevated to 161 on admission. Ethanol level <10. CIWA 0.  -Thiamine, folate, multivitamin -Monitor clinically for signs of withdrawal   Chronic anemia Anemia of chronic disease w/ Ferritin 725, TIBC 109 Sat 52, Iron WNL. Hgb 10-11 at baseline, today 9.1.  Remains stable.  -F/u CBC -D/C CIWA  Physical deconditioning Suspect deconditioning given prolonged hospital course complicated by CHF exacerbation and RSV.  She is notably short of breath when ambulating short distances, continues to require 3 L of oxygen.  Would likely benefit from therapy. -PT/OT consult   FEN/GI: Heart healthy/carb modified PPx: Protonix, famotidine Dispo: Pending clinical improvement, possible cardiac cath on Monday  Subjective:  Patient at bedside, continues to endorse shortness of breath and fatigue.  Patient walks to bathroom twice to urinate during the evaluation.  States that she feels her respiratory status has not improved, wishes to go home.  Discussed importance of keeping oxygen on.   Objective: Temp:  [97.8 F (36.6 C)-98.5 F (36.9 C)] 98.5 F (36.9 C) (10/31 1207) Pulse Rate:  [86-101] 101 (10/31 0805) Resp:  [18-20] 18 (10/31 1207) BP: (108-115)/(78-83) 110/78 (10/31 0501) SpO2:  [90 %-100 %] 90 % (10/31 1207) Weight:  [75.1 kg] 75.1 kg (10/31 0015) Physical Exam: General: NAD, sitting up in bed, coughing Cardiovascular: Tachycardic, regular rhythm, no murmurs rubs or gallops Respiratory: Rhonchi and subtle wheezing bilaterally on 3L [place] with some work of breathing. Abdomen: Soft, nontender to palpation Extremities: Nonpitting edema present bilaterally in lower extremities  Laboratory: Most recent CBC Lab Results  Component Value Date   WBC 7.5 01/10/2022   HGB 9.1 (L) 01/10/2022   HCT 26.7 (L) 01/10/2022   MCV 86.7 01/10/2022   PLT 198 01/10/2022   Most recent BMP    Latest Ref Rng & Units 01/10/2022    9:51 AM  BMP  Glucose 70 - 99 mg/dL 89   BUN 6 - 20 mg/dL 5   Creatinine 0.44 -  1.00 mg/dL 5.18   Sodium 841 - 660 mmol/L 140   Potassium 3.5 - 5.1 mmol/L 3.5   Chloride 98 - 111 mmol/L 103   CO2 22 - 32 mmol/L 22   Calcium 8.9 - 10.3 mg/dL 8.9     Imaging/Diagnostic Tests: DG Chest 2 View  Result Date:  01/10/2022 CLINICAL DATA:  Shortness of breath. Acute CHF. Dyspnea. History of pneumonia. EXAM: CHEST - 2 VIEW COMPARISON:  Chest two views 01/05/2022, AP chest 12/11/2021; CT chest 01/06/2022 FINDINGS: Cardiac silhouette and mediastinal contours are unchanged and within normal limits. Improved aeration of the bilateral lung bases. Persistent but more mild left-greater-than-right lower lung heterogeneous airspace opacification, seen posteriorly on lateral view. No pleural effusion or pneumothorax. Mild-to-moderate multilevel degenerative disc changes of the thoracic spine. Bilateral nipple piercings are again noted. IMPRESSION: Improved aeration of the bilateral lung bases. Persistent but more mild left-greater-than-right lower lung heterogeneous airspace opacification, seen posteriorly on lateral view. Electronically Signed   By: Neita Garnet M.D.   On: 01/10/2022 12:07   MR CARDIAC MORPHOLOGY W WO CONTRAST  Result Date: 01/10/2022 CLINICAL DATA:  Clinical question of NSTEMI and cardiomyopathy Study assumes HCT of 22 and BSA of 1.87 m2 EXAM: CARDIAC MRI TECHNIQUE: The patient was scanned on a 1.5 Tesla GE magnet. A dedicated cardiac coil was used. Functional imaging was done using Fiesta sequences. 2,3, and 4 chamber views were done to assess for RWMA's. Modified Simpson's rule using a short axis stack was used to calculate an ejection fraction on a dedicated work [employer]. The patient received 10 cc of Gadavist. After 10 minutes inversion recovery sequences were used to assess for infiltration and scar tissue. Velocity encoding sequences performed for valve assessment. CONTRAST:  10 cc  of Gadavist FINDINGS: 1. Mild left ventricular dilation, with LVEDD 53 mm, but LVEDVi 96 mL/m2. Normal left ventricular thickness, with intraventricular septal thickness of 6 mm, posterior wall thickness of 6 mm, and myocardial mass index of 56 g/m2. Mild to moderate reduction in left ventricular  systolic function (LVEF =40%). There is mid lateral hypokinesis. There is severe apical hypokinesis of all segments. There is no LV thrombus. Left ventricular parametric mapping notable for increased ECV signal in the mid lateral (40-45%) and apex (40-46%). There is elevation in T2 signal mid lateral (60-63 ms) and apex (60-65 ms). There is no late gadolinium enhancement in the left ventricular myocardium. 2. Moderate increase in right ventricular size with RVEDVI 116 mL/m2. Normal right ventricular thickness. Mild decrease in right ventricular systolic function (RVEF =44%). There are no regional wall motion abnormalities or aneurysms. 3.  Normal left and right atrial size. 4.  Normal size of the aortic root, ascending aorta. Dilated pulmonary artery 32 mm. 5. Valve assessment: Aortic Valve: Tri-leaflet aortic valve. There is no significant regurgitation. Regurgitant fraction 3%. Pulmonic Valve: There is no significant regurgitation. Regurgitant fraction 1.7%. Tricuspid Valve: There is mild regurgitation. Regurgitant fraction 9%. Mitral Valve: There is mild regurgitation. Regurgitant fraction 7%. 6. Normal pericardium. No pericardial effusion. There is no pericardial thickening or hyper-enhancement. 7. Grossly, no extracardiac findings. Recommended dedicated study if concerned for non-cardiac pathology. 8. Breathhold artifacts noted. This decreased the sensitivity of VENC assessment. IMPRESSION: LVEF 40% Based on parametric mapping, differential may include LAD disease, myocarditis and stress cardiomyopathy: The apical rise in T1 values, higher ECV in the apex (rather that diffuse or inferoseptal) and apical hypokinesis favors stress induced cardiomyopathy or LAD disease. Riley Lam MD Electronically Signed  By: Riley Lam M.D.   On: 01/10/2022 09:50   MR CARDIAC VELOCITY FLOW MAP  Result Date: 01/10/2022 CLINICAL DATA:  Clinical question of NSTEMI and cardiomyopathy Study assumes HCT of  22 and BSA of 1.87 m2 EXAM: CARDIAC MRI TECHNIQUE: The patient was scanned on a 1.5 Tesla GE magnet. A dedicated cardiac coil was used. Functional imaging was done using Fiesta sequences. 2,3, and 4 chamber views were done to assess for RWMA's. Modified Simpson's rule using a short axis stack was used to calculate an ejection fraction on a dedicated work [employer], [employer]. The patient received 10 cc of Gadavist. After 10 minutes inversion recovery sequences were used to assess for infiltration and scar tissue. Velocity encoding sequences performed for valve assessment. CONTRAST:  10 cc  of Gadavist FINDINGS: 1. Mild left ventricular dilation, with LVEDD 53 mm, but LVEDVi 96 mL/m2. Normal left ventricular thickness, with intraventricular septal thickness of 6 mm, posterior wall thickness of 6 mm, and myocardial mass index of 56 g/m2. Mild to moderate reduction in left ventricular systolic function (LVEF =40%). There is mid lateral hypokinesis. There is severe apical hypokinesis of all segments. There is no LV thrombus. Left ventricular parametric mapping notable for increased ECV signal in the mid lateral (40-45%) and apex (40-46%). There is elevation in T2 signal mid lateral (60-63 ms) and apex (60-65 ms). There is no late gadolinium enhancement in the left ventricular myocardium. 2. Moderate increase in right ventricular size with RVEDVI 116 mL/m2. Normal right ventricular thickness. Mild decrease in right ventricular systolic function (RVEF =44%). There are no regional wall motion abnormalities or aneurysms. 3.  Normal left and right atrial size. 4.  Normal size of the aortic root, ascending aorta. Dilated pulmonary artery 32 mm. 5. Valve assessment: Aortic Valve: Tri-leaflet aortic valve. There is no significant regurgitation. Regurgitant fraction 3%. Pulmonic Valve: There is no significant regurgitation. Regurgitant fraction 1.7%. Tricuspid Valve: There is mild regurgitation. Regurgitant fraction 9%.  Mitral Valve: There is mild regurgitation. Regurgitant fraction 7%. 6. Normal pericardium. No pericardial effusion. There is no pericardial thickening or hyper-enhancement. 7. Grossly, no extracardiac findings. Recommended dedicated study if concerned for non-cardiac pathology. 8. Breathhold artifacts noted. This decreased the sensitivity of VENC assessment. IMPRESSION: LVEF 40% Based on parametric mapping, differential may include LAD disease, myocarditis and stress cardiomyopathy: The apical rise in T1 values, higher ECV in the apex (rather that diffuse or inferoseptal) and apical hypokinesis favors stress induced cardiomyopathy or LAD disease. Riley Lam MD Electronically Signed   By: Riley Lam M.D.   On: 01/10/2022 09:50      Lorri Frederick, MD 01/10/2022, 1:37 PM  PGY-1, [facility] FPTS Intern pager: [phone], text pages welcome Secure chat group [facility] Teaching Service

## 2022-01-11 ENCOUNTER — Inpatient Hospital Stay (HOSPITAL_COMMUNITY): Payer: Commercial Managed Care - HMO

## 2022-01-11 DIAGNOSIS — M7989 Other specified soft tissue disorders: Secondary | ICD-10-CM

## 2022-01-11 DIAGNOSIS — R609 Edema, unspecified: Secondary | ICD-10-CM | POA: Diagnosis not present

## 2022-01-11 DIAGNOSIS — I5021 Acute systolic (congestive) heart failure: Secondary | ICD-10-CM | POA: Diagnosis not present

## 2022-01-11 DIAGNOSIS — I509 Heart failure, unspecified: Secondary | ICD-10-CM | POA: Diagnosis not present

## 2022-01-11 DIAGNOSIS — J9600 Acute respiratory failure, unspecified whether with hypoxia or hypercapnia: Secondary | ICD-10-CM | POA: Diagnosis not present

## 2022-01-11 DIAGNOSIS — D649 Anemia, unspecified: Secondary | ICD-10-CM | POA: Diagnosis not present

## 2022-01-11 LAB — CBC
HCT: 20 % — ABNORMAL LOW (ref 36.0–46.0)
Hemoglobin: 6.9 g/dL — CL (ref 12.0–15.0)
MCH: 30.3 pg (ref 26.0–34.0)
MCHC: 34.5 g/dL (ref 30.0–36.0)
MCV: 87.7 fL (ref 80.0–100.0)
Platelets: 138 10*3/uL — ABNORMAL LOW (ref 150–400)
RBC: 2.28 MIL/uL — ABNORMAL LOW (ref 3.87–5.11)
RDW: 19.8 % — ABNORMAL HIGH (ref 11.5–15.5)
WBC: 5.3 10*3/uL (ref 4.0–10.5)
nRBC: 0.6 % — ABNORMAL HIGH (ref 0.0–0.2)

## 2022-01-11 LAB — BASIC METABOLIC PANEL
Anion gap: 9 (ref 5–15)
BUN: 6 mg/dL (ref 6–20)
CO2: 25 mmol/L (ref 22–32)
Calcium: 8.1 mg/dL — ABNORMAL LOW (ref 8.9–10.3)
Chloride: 106 mmol/L (ref 98–111)
Creatinine, Ser: 1.11 mg/dL — ABNORMAL HIGH (ref 0.44–1.00)
GFR, Estimated: >60 mL/min (ref >60)
Glucose, Bld: 94 mg/dL (ref 70–99)
Potassium: 3.4 mmol/L — ABNORMAL LOW (ref 3.5–5.1)
Sodium: 140 mmol/L (ref 135–145)

## 2022-01-11 LAB — HEMOGLOBIN AND HEMATOCRIT, BLOOD
HCT: 27.6 % — ABNORMAL LOW (ref 36.0–46.0)
Hemoglobin: 9.6 g/dL — ABNORMAL LOW (ref 12.0–15.0)

## 2022-01-11 LAB — MAGNESIUM: Magnesium: 1.8 mg/dL (ref 1.7–2.4)

## 2022-01-11 LAB — LIPOPROTEIN A (LPA): Lipoprotein (a): <8.4 nmol/L (ref <75.0)

## 2022-01-11 LAB — PREPARE RBC (CROSSMATCH)

## 2022-01-11 MED ORDER — SODIUM CHLORIDE 0.9% IV SOLUTION: Freq: Once | INTRAVENOUS | Status: DC

## 2022-01-11 MED ORDER — FUROSEMIDE 40 MG PO TABS: 40.0000 mg | ORAL_TABLET | Freq: Every day | ORAL | Status: DC

## 2022-01-11 MED ORDER — MAGNESIUM SULFATE IN D5W 1-5 GM/100ML-% IV SOLN
1.0000 g | Freq: Once | INTRAVENOUS | Status: AC
  Administered 2022-01-11: 1 g via INTRAVENOUS
  Filled 2022-01-11: qty 100

## 2022-01-11 MED ORDER — FUROSEMIDE 40 MG PO TABS
40.0000 mg | ORAL_TABLET | Freq: Every day | ORAL | Status: DC
  Administered 2022-01-11 – 2022-01-12 (×2): 40 mg via ORAL
  Filled 2022-01-11 (×2): qty 1

## 2022-01-11 MED ORDER — POTASSIUM CHLORIDE CRYS ER 20 MEQ PO TBCR
40.0000 meq | EXTENDED_RELEASE_TABLET | Freq: Once | ORAL | Status: AC
  Administered 2022-01-11: 40 meq via ORAL
  Filled 2022-01-11: qty 2

## 2022-01-11 NOTE — Progress Notes (Signed)
01/11/22 1000  Mobility  Activity Ambulated with assistance in hallway  Level of Assistance Standby assist, set-up cues, supervision of patient - no hands on  Assistive Device Cane  Distance Ambulated (ft) 200 ft  Activity Response Tolerated well  Mobility Referral Yes  $Mobility charge 1 Mobility   Mobility Specialist Progress Note  Pt was in bed and agreeable. Had no c/o pain throughout ambulation. Returned to bed w/ all needs met and all bell in reach.   Lucious Groves Mobility Specialist

## 2022-01-11 NOTE — Progress Notes (Signed)
Lower extremity venous has been completed.   Preliminary results in CV Proc.   Kristin [name] Kristin [name] 01/11/2022 2:02 PM

## 2022-01-11 NOTE — Progress Notes (Signed)
@  56 Dr. Marcha Dutton, on-call for attending, text-paged notification of pt's critical Hgb (6.9) per policy. Page promptly returned and orders received for transfusion of 1 unit PRBCs.

## 2022-01-11 NOTE — Evaluation (Signed)
Occupational Therapy Evaluation Patient Details Name: Kristin [name] MRN: [id number] DOB: 23-Nov-1975 Today's Date: 01/11/2022   History of Present Illness Pt is 46 yo female admitted 01/05/22 with CHF exacerbation and RSV with possible CAP.  Pt with hx of HTN, anemia, ETOH and cocaine abuse.   Clinical Impression   Kristin [name] was evaluated s/p the above admission list, she is typically mod I with use of SPC at baseline. Upon arrival, pt was ambulating in the room without SPC. She was generalized supervision A - mod I for all tasks. She is limited by back pain, coughing and decreased activity tolerance. OT to continue to follow acutely to progress towards mod I baseline. D/c to home without OT follow up.     Recommendations for follow up therapy are one component of a multi-disciplinary discharge planning process, led by the attending physician.  Recommendations may be updated based on patient status, additional functional criteria and insurance authorization.   Follow Up Recommendations  No OT follow up    Assistance Recommended at Discharge Set up Supervision/Assistance  Patient can return home with the following A little help with walking and/or transfers;A little help with bathing/dressing/bathroom;Assist for transportation    Functional Status Assessment  Patient has had a recent decline in their functional status and demonstrates the ability to make significant improvements in function in a reasonable and predictable amount of time.  Equipment Recommendations  None recommended by OT       Precautions / Restrictions Precautions Precautions: None Restrictions Weight Bearing Restrictions: No      Mobility Bed Mobility Overal bed mobility: Modified Independent                  Transfers Overall transfer level: Needs assistance Equipment used: Straight cane Transfers: Sit to/from Stand Sit to Stand: Modified independent (Device/Increase time)           General  transfer comment: generalized superivsion provided, demonstrated mod I      Balance Overall balance assessment: Needs assistance Sitting-balance support: No upper extremity supported Sitting balance-Leahy Scale: Good     Standing balance support: No upper extremity supported Standing balance-Leahy Scale: Good                             ADL either performed or assessed with clinical judgement   ADL Overall ADL's : Needs assistance/impaired Eating/Feeding: Independent   Grooming: Supervision/safety;Standing   Upper Body Bathing: Set up;Sitting   Lower Body Bathing: Supervison/ safety;Sit to/from stand   Upper Body Dressing : Set up;Sitting   Lower Body Dressing: Min guard;Sit to/from stand   Toilet Transfer: Supervision/safety;Ambulation   Toileting- Clothing Manipulation and Hygiene: Supervision/safety;Sit to/from stand       Functional mobility during ADLs: Supervision/safety General ADL Comments: pt ambulating in room upon arrival, no AD. generalized supervision A provided for safety only.     Vision Baseline Vision/History: 0 No visual deficits Vision Assessment?: No apparent visual deficits            Pertinent Vitals/Pain Pain Assessment Pain Assessment: Faces Faces Pain Scale: Hurts a little bit Pain Location: back Pain Descriptors / Indicators: Discomfort Pain Intervention(s): Monitored during session, Limited activity within patient's tolerance     Hand Dominance Right   Extremity/Trunk Assessment Upper Extremity Assessment Upper Extremity Assessment: Generalized weakness   Lower Extremity Assessment Lower Extremity Assessment: Defer to PT evaluation   Cervical / Trunk Assessment Cervical / Trunk Assessment: Other exceptions  Cervical / Trunk Exceptions: forward trunk; reports chronic pinched nerve in lumbar region   Communication Communication Communication: No difficulties   Cognition Arousal/Alertness: Awake/alert Behavior  During Therapy: WFL for tasks assessed/performed Overall Cognitive Status: Within Functional Limits for tasks assessed                 General Comments  VSS on RA     Home Living Family/patient expects to be discharged to:: Private residence Living Arrangements: Parent;Children Available Help at Discharge: Family;Available PRN/intermittently   Home Access: Stairs to enter Entrance Stairs-Number of Steps: flight Entrance Stairs-Rails: Right Home Layout: One level     Bathroom Shower/Tub: Tub/shower unit         Home Equipment: Kristin [name] - single point   Additional Comments: Pt's mothers home set up. 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        OT Problem List: Decreased strength;Decreased range of motion;Decreased activity tolerance;Decreased safety awareness;Decreased knowledge of use of DME or AE;Decreased knowledge of precautions;Pain      OT Treatment/Interventions: Self-care/ADL training;Therapeutic exercise;DME and/or AE instruction;Therapeutic activities;Patient/family education;Balance training    OT Goals(Current goals can be found in the care plan section) Acute Rehab OT Goals Patient Stated Goal: less pain OT Goal Formulation: With patient Time For Goal Achievement: 01/25/22 Potential to Achieve Goals: Good ADL Goals Additional ADL Goal #1: Pt will complete all BADLs with mod I Additional ADL Goal #2: Pt will tolerate at least 10 minutes of OOB funcitonal activity without sitting rest break  OT Frequency: Min 2X/week       AM-PAC  OT "6 Clicks" Daily Activity     Outcome Measure Help from another person eating meals?: None Help from another person taking care of personal grooming?: A Little Help from another person toileting, which includes using toliet, bedpan, or urinal?: A Little Help from another person bathing (including washing, rinsing, drying)?: A Little Help from another person to put on and taking off regular upper body clothing?: None Help from another person to put on and taking off regular lower body clothing?: A Little 6 Click Score: 20   End of Session Equipment Utilized During Treatment: Gait belt Nurse Communication: Mobility status  Activity Tolerance: Patient tolerated treatment well Patient left: in bed;with call bell/phone within reach  OT Visit Diagnosis: Unsteadiness on feet (R26.81);Other abnormalities of gait and mobility (R26.89);Muscle weakness (generalized) (M62.81);Pain                Time: 1761-6073 OT Time Calculation (min): 13 min Charges:  OT General Charges $OT Visit: 1 Visit OT Evaluation $OT Eval Low Complexity: 1 Low   Josph Norfleet D Causey 01/11/2022, 1:48 PM

## 2022-01-11 NOTE — TOC Progression Note (Addendum)
Transition of Care [facility]) - Progression Note    Patient Details  Name: Kristin [name] MRN: [id number] Date of Birth: [birth year]  [facility] CM/SW Contact  Zenon Mayo, RN Phone Number: 01/11/2022, 4:18 PM  Clinical Narrative:    NCM spoke with patient, she states she is basically homeless , NCM informed her will give her a housing list, she states she does have family here but does not want to stay with them. She states she has a car at her daughter's house.  Daughter will transport her at dc where she will be going.  She states she would like to be screened for disability.  NCM sent email to financial counseling to screen patient for disability.  Patient's cell phone is [phone].  She states physical therapy recs a rollator for her.  NCM offered choice for DME  , she states she does not have a preference.  NCM will make referral to [employer].  new CHF, RSV, CAP, broken heart syndrome, iv lasix, hgb 6.9 got 1unit. TOC following.        Expected Discharge Plan and Services                                                 Social Determinants of Health (SDOH) Interventions    Readmission Risk Interventions     No data to display

## 2022-01-11 NOTE — Progress Notes (Signed)
Daily Progress Note Intern Pager: [phone]  Patient name: Kristin [name] Medical record number: [id number] Date of birth: 19-Jan-1976 Age: 46 y.o. Gender: female  Primary Care Provider: Jerre Simon, MD Consultants: Cardiology Code Status: Full  Pt Overview and Major Events to Date:  10/27 admitted, echo with EF 25-30% ? Takotsubo, troponin remain elevated 10/30 Catheterization  Assessment and Plan: Kristin [name] is a 46 y.o. female  with PMHx of HTN, alcohol, and cocaine use presenting with shortness of breath and found to have new onset CHF exacerbation with acute hypoxemic respiratory failure and RSV positive with possible CAP.     * Acute HFrEF (heart failure with reduced ejection fraction) (HCC) Cardiac MRI shows LVEF 40%, and apical hypokinesis, most likely due to stress cardiomyopathy. Mild LE edema present with pain for the past few days. On 2 L [place] today. Total I/O in the past 12 hours is -323 mL/kg. Creatinine trend: 1.05>1.11. CXR shows improved aeration of the b/l lung bases. Change from IV to PO lasix today.   - Cardiac Telemetry - Cardiology consulted, appreciate care and recommendations - Order VAS LE [place] left leg to check DVT - Change Lasix 40mg  IV to PO daily - Spironolactone 12.5 mg daily - Coreg 3.125 mg BID - Strict I/Os, Daily weights - F/u CBC,CMP  RSV (respiratory syncytial virus pneumonia) CTA with findings suggestive of possible multifocal pneumonia 2/2 to now suspected RSV infection.  Reassuringly has remained afebrile without leukocytosis. Improving SOB this morning, now on 2L via [place] as she was on 3L.  -Continue Tessalon, Tylenol, Zofran as needed  Chronic anemia Has anemia with chronic disease. Hgb 10-11 at baseline, last night decreased: 9.1>6.9. Patient was transfused with 1 unit of RBC. Ordered CBC. -F/u CBC -Order CTAP to evaluate for retroperitoneal bleeding   Elevated troponin-resolved as of 01/10/2022 [place], 01/12/2022. - Cards following,  appreciate recs  -D/c heparin gtt -Tele monitoring -Repeat EKG if having chest pain  Substance abuse (HCC) Alcohol abuse per chart review. UDS + cocaine on admission. Denies h/o withdrawal. AST elevated to 161 on admission. Ethanol level <10. CIWA 0.  -Thiamine, folate, multivitamin -Monitor clinically for signs of withdrawal   Physical deconditioning Suspect deconditioning given prolonged hospital course complicated by CHF exacerbation and RSV.  She is notably short of breath when ambulating short distances, improved from to requiring 3 L to 2 L of oxygen. No recommendations from PT -PT/OT consult     FEN/GI: Heart healthy/carb modified PPx: Protonix, famotidine Dispo: Pending clinical improvement, possible cardiac cath on Monday  Subjective:  Patient was seen in bed this AM. She reports pain in her legs that have been present for the past few days. She notes continued dyspnea.  Objective: Temp:  [98.1 F (36.7 C)-98.5 F (36.9 C)] 98.1 F (36.7 C) (11/01 0842) Pulse Rate:  [74-94] 94 (11/01 0842) Resp:  [16-18] 16 (11/01 0554) BP: (102-125)/(78-92) 120/92 (11/01 0842) SpO2:  [90 %-100 %] 94 % (11/01 0554) Weight:  [163 lb 6.4 oz (74.1 kg)] 163 lb 6.4 oz (74.1 kg) (11/01 0333)  General: Pleasant, well-appearing in bed. No acute distress. CV: RRR. No murmurs, rubs, or gallops. Mild LE edema Pulmonary: Rhonchi present. Normal effort. On 2L [place] Abdominal: Soft, nontender, nondistended.  Skin: Warm and dry. No obvious rash or lesions. Neuro: A&Ox3. Moves all extremities. Normal sensation. No focal deficit.   Laboratory: Most recent CBC Lab Results  Component Value Date   WBC 5.3 01/11/2022   HGB  6.9 (LL) 01/11/2022   HCT 20.0 (L) 01/11/2022   MCV 87.7 01/11/2022   PLT 138 (L) 01/11/2022   Most recent BMP    Latest Ref Rng & Units 01/11/2022    1:03 AM  BMP  Glucose 70 - 99 mg/dL 94   BUN 6 - 20 mg/dL 6   Creatinine 0.44 - 1.00 mg/dL 1.11   Sodium 135 - 145  mmol/L 140   Potassium 3.5 - 5.1 mmol/L 3.4   Chloride 98 - 111 mmol/L 106   CO2 22 - 32 mmol/L 25   Calcium 8.9 - 10.3 mg/dL 8.1    CT chest 10/31 Improved aeration of the bilateral lung bases. Persistent but more mild left-greater-than-right lower lung heterogeneous airspace opacification, seen posteriorly on lateral view.   Stormy Fabian, MD 01/11/2022, 10:37 AM  PGY-1, [place] Intern pager: [phone], text pages welcome Secure chat group [place]

## 2022-01-12 DIAGNOSIS — R0603 Acute respiratory distress: Secondary | ICD-10-CM

## 2022-01-12 DIAGNOSIS — R7989 Other specified abnormal findings of blood chemistry: Secondary | ICD-10-CM | POA: Diagnosis not present

## 2022-01-12 DIAGNOSIS — I5021 Acute systolic (congestive) heart failure: Secondary | ICD-10-CM | POA: Diagnosis not present

## 2022-01-12 HISTORY — DX: Acute respiratory distress: R06.03

## 2022-01-12 LAB — CBC
HCT: 23.9 % — ABNORMAL LOW (ref 36.0–46.0)
HCT: 28.7 % — ABNORMAL LOW (ref 36.0–46.0)
Hemoglobin: 8.4 g/dL — ABNORMAL LOW (ref 12.0–15.0)
Hemoglobin: 9.7 g/dL — ABNORMAL LOW (ref 12.0–15.0)
MCH: 30.1 pg (ref 26.0–34.0)
MCH: 29.6 pg (ref 26.0–34.0)
MCHC: 35.1 g/dL (ref 30.0–36.0)
MCHC: 33.8 g/dL (ref 30.0–36.0)
MCV: 85.7 fL (ref 80.0–100.0)
MCV: 87.5 fL (ref 80.0–100.0)
Platelets: 159 10*3/uL (ref 150–400)
Platelets: 196 10*3/uL (ref 150–400)
RBC: 2.79 MIL/uL — ABNORMAL LOW (ref 3.87–5.11)
RBC: 3.28 MIL/uL — ABNORMAL LOW (ref 3.87–5.11)
RDW: 19 % — ABNORMAL HIGH (ref 11.5–15.5)
RDW: 19.5 % — ABNORMAL HIGH (ref 11.5–15.5)
WBC: 5.1 10*3/uL (ref 4.0–10.5)
WBC: 5.3 10*3/uL (ref 4.0–10.5)
nRBC: 0 % (ref 0.0–0.2)
nRBC: 0 % (ref 0.0–0.2)

## 2022-01-12 LAB — BASIC METABOLIC PANEL
Anion gap: 10 (ref 5–15)
BUN: 6 mg/dL (ref 6–20)
CO2: 23 mmol/L (ref 22–32)
Calcium: 8.2 mg/dL — ABNORMAL LOW (ref 8.9–10.3)
Chloride: 106 mmol/L (ref 98–111)
Creatinine, Ser: 1.26 mg/dL — ABNORMAL HIGH (ref 0.44–1.00)
GFR, Estimated: 53 mL/min — ABNORMAL LOW (ref >60)
Glucose, Bld: 92 mg/dL (ref 70–99)
Potassium: 3.5 mmol/L (ref 3.5–5.1)
Sodium: 139 mmol/L (ref 135–145)

## 2022-01-12 LAB — TYPE AND SCREEN
ABO/RH(D): O POS
Antibody Screen: NEGATIVE
Unit division: 0

## 2022-01-12 LAB — BPAM RBC
Blood Product Expiration Date: 202311052359
ISSUE DATE / TIME: 202311010531
Unit Type and Rh: 9500

## 2022-01-12 LAB — MAGNESIUM: Magnesium: 1.8 mg/dL (ref 1.7–2.4)

## 2022-01-12 MED ORDER — FUROSEMIDE 40 MG PO TABS: 40.0000 mg | ORAL_TABLET | Freq: Every day | ORAL | 0 refills | Status: DC

## 2022-01-12 MED ORDER — DAPAGLIFLOZIN PROPANEDIOL 10 MG PO TABS: 10.0000 mg | ORAL_TABLET | Freq: Every morning | ORAL | 0 refills | Status: AC

## 2022-01-12 MED ORDER — LOSARTAN POTASSIUM 25 MG PO TABS: 12.5000 mg | ORAL_TABLET | Freq: Every day | ORAL | 0 refills | Status: DC

## 2022-01-12 MED ORDER — BENZONATATE 100 MG PO CAPS: 100.0000 mg | ORAL_CAPSULE | Freq: Three times a day (TID) | ORAL | 0 refills | Status: DC | PRN

## 2022-01-12 MED ORDER — NICOTINE 14 MG/24HR TD PT24: 14.0000 mg | MEDICATED_PATCH | Freq: Every day | TRANSDERMAL | 0 refills | Status: DC

## 2022-01-12 MED ORDER — FOLIC ACID 1 MG PO TABS: 1.0000 mg | ORAL_TABLET | Freq: Every day | ORAL | 0 refills | Status: DC

## 2022-01-12 MED ORDER — MAGNESIUM SULFATE IN D5W 1-5 GM/100ML-% IV SOLN
1.0000 g | Freq: Once | INTRAVENOUS | Status: AC
  Administered 2022-01-12: 1 g via INTRAVENOUS
  Filled 2022-01-12: qty 100

## 2022-01-12 MED ORDER — FAMOTIDINE 20 MG PO TABS: 20.0000 mg | ORAL_TABLET | Freq: Two times a day (BID) | ORAL | 0 refills | Status: DC

## 2022-01-12 MED ORDER — SPIRONOLACTONE 25 MG PO TABS: 12.5000 mg | ORAL_TABLET | Freq: Every day | ORAL | 0 refills | Status: DC

## 2022-01-12 MED ORDER — CARVEDILOL 3.125 MG PO TABS: 3.1250 mg | ORAL_TABLET | Freq: Two times a day (BID) | ORAL | 0 refills | Status: DC

## 2022-01-12 MED ORDER — POTASSIUM CHLORIDE CRYS ER 20 MEQ PO TBCR
40.0000 meq | EXTENDED_RELEASE_TABLET | Freq: Once | ORAL | Status: AC
  Administered 2022-01-12: 40 meq via ORAL
  Filled 2022-01-12: qty 2

## 2022-01-12 MED ORDER — VITAMIN B-1 100 MG PO TABS: 100.0000 mg | ORAL_TABLET | Freq: Every day | ORAL | 0 refills | Status: DC

## 2022-01-12 NOTE — Progress Notes (Signed)
   01/12/22 1418  Mobility  Activity Ambulated independently in hallway  Level of Assistance Independent  Assistive Device None  Distance Ambulated (ft) 550 ft  Activity Response Tolerated well  Mobility Referral Yes  $Mobility charge 1 Mobility   Mobility Specialist Progress Note  Mobility Specialist Progress Note  Pre-Mobility: 124/92 BP Post-Mobility:136/97 BP  Received pt in bed having no complaints and agreeable to mobility. Pt was asymptomatic throughout ambulation and returned to room w/o fault. Left in bed w/ call bell in reach and all needs met.   Lucious Groves Mobility Specialist

## 2022-01-12 NOTE — Progress Notes (Signed)
Daily Progress Note Intern Pager: [phone]  Patient name: LANETTE ELL Medical record number: [id number] Date of birth: [birth year] Age: 46 y.o. Gender: female  Primary Care Provider: Alen Bleacher, MD Consultants: Cardiology Code Status: Full  Pt Overview and Major Events to Date:  10/27 admitted, echo with EF 25-30%, Takotsubo, troponin remain elevated 10/30 Catheterization  Assessment and Plan: ALIVEA GLADSON is a 45 y.o. female  with PMHx of HTN, alcohol, and cocaine use presenting with shortness of breath and found to have new onset CHF exacerbation with acute hypoxemic respiratory failure and RSV positive with CAP.      * Acute HFrEF (heart failure with reduced ejection fraction) (HCC) Cardiac MRI shows LVEF of 40% and apical hypokinesis, most likely due to stress cardiomyopathy. LE [place] negative DVT. Creatinine trend: 1.05>1.11>1.26.  - Cardiac Telemetry - Cardiology consulted, appreciate care and recommendations - Continue Lasix 40mg  PO daily - Spironolactone 12.5 mg daily - Coreg 3.125 mg BID - Strict I/Os, Daily weights - F/u CBC,CMP  RSV (respiratory syncytial virus pneumonia) Respiratory panel shows RSV infection. Improving SOB, was on RA yesterday, only has to be on  this morning with exerted effort.   -Continue Tessalon, Tylenol, Zofran as needed -Encourage I/S  Chronic anemia Has anemia with chronic disease. Hgb 10-11 at baseline, now 8.4 s/p transfusion with 1 unit of RBC. CTAP did not show retroperitoneal bleeding. -Monitor CBC  Substance abuse ([place]) Alcohol abuse per chart review. UDS + cocaine on admission. Denies h/o withdrawal. AST elevated to 161 on admission. Ethanol level <10. CIWA 0.  -Thiamine, folate, multivitamin -Monitor clinically for signs of withdrawal      FEN/GI: Heart healthy/carb modified PPx: Protonix, famotidine Dispo: Pending clinical improvement, Barriers include oxygen requirement.   Subjective:  Patient was seen in  bed this AM. She reports continued SOB and cough. She started using the I/S. She continues to feel some numbness in her legs.  Objective: Temp:  [98.2 F (36.8 C)] 98.2 F (36.8 C) (11/02 0350) Pulse Rate:  [71-81] 81 (11/02 0350) Resp:  [18] 18 (11/02 0350) BP: (100-101)/(71-73) 100/73 (11/02 0350) SpO2:  [92 %-94 %] 92 % (11/02 0350) Weight:  [160 lb 8 oz (72.8 kg)] 160 lb 8 oz (72.8 kg) (11/02 0350)  Physical Exam: General: Pleasant, well-appearing in bed. No acute distress. CV: RRR. No murmurs, rubs, or gallops. Mild LE edema Pulmonary: Lungs CTAB. Increased effort. No wheezing or rales. Skin: Warm and dry. No obvious rash or lesions. Neuro: A&Ox3. Moves all extremities. Normal sensation. No focal deficit.  Laboratory: Most recent CBC Lab Results  Component Value Date   WBC 5.1 01/12/2022   HGB 8.4 (L) 01/12/2022   HCT 23.9 (L) 01/12/2022   MCV 85.7 01/12/2022   PLT 159 01/12/2022   Most recent BMP    Latest Ref Rng & Units 01/12/2022   12:59 AM  BMP  Glucose 70 - 99 mg/dL 92   BUN 6 - 20 mg/dL 6   Creatinine 0.44 - 1.00 mg/dL 1.26   Sodium 135 - 145 mmol/L 139   Potassium 3.5 - 5.1 mmol/L 3.5   Chloride 98 - 111 mmol/L 106   CO2 22 - 32 mmol/L 23   Calcium 8.9 - 10.3 mg/dL 8.2      Imaging/Diagnostic Tests:  VAS LE [place]: negative  CTAP: Left lower lobe airspace opacity is noted concerning for pneumonia. Hepatic steatosis. No other definite abnormality seen in the abdomen or pelvis on these unenhanced  images.   Stormy Fabian, MD 01/12/2022, 8:57 AM  PGY-1, [place] Intern pager: [phone], text pages welcome Secure chat group [place]

## 2022-01-12 NOTE — Progress Notes (Signed)
   01/12/22 0947  Mobility  Activity Ambulated independently in hallway  Level of Assistance Independent  Assistive Device None  Distance Ambulated (ft) 290 ft  Activity Response Tolerated well  Mobility Referral Yes  $Mobility charge 1 Mobility   Mobility Specialist Progress Note  Received pt in bed having no complaints and agreeable to mobility. Pt was asymptomatic throughout ambulation and returned to room w/o fault. Left in bed w/ call bell in reach and all needs met.   Lucious Groves Mobility Specialist

## 2022-01-12 NOTE — Discharge Summary (Addendum)
Family Medicine Teaching [facility] Discharge Summary  Patient name: Kristin [name] Medical record number: [id number] Date of birth: May 07, 1975 Age: 46 y.o. Gender: female Date of Admission: 01/05/2022  Date of Discharge: 11/2 Admitting Physician: Doreene Eland, MD  Primary Care Provider: Jerre Simon, MD Consultants: cardiology  Indication for Hospitalization: CHF   Discharge Diagnoses/Problem List:  Principal Problem for Admission:  Other Problems addressed during stay:  Principal Problem:   Acute HFrEF (heart failure with reduced ejection fraction) (HCC) Active Problems:   RSV (respiratory syncytial virus pneumonia)   Chronic anemia   Elevated troponin   Substance abuse (HCC)   Sepsis with acute hypoxic respiratory failure without septic shock, due to unspecified organism (HCC)   Acute respiratory failure (HCC)   Acute congestive heart failure (HCC)   Physical deconditioning   Stress-induced cardiomyopathy   Respiratory distress    Brief Hospital Course:  Kristin [name] is a 46 y.o. female who presented with worsening SOB, cough, and BLE swelling and found to have new non-ischemic cardiomyopathy. PMHx significant for alcohol use, tobacco use, HTN, esophagitis, and bipolar disorder. A brief hospital course is below.    Non-ischemic cardiomyopathy Presented with SOB, cough, and BLE swelling. She was tachycardic, tachypneic, and hypertensive with 2+ BLE pitting edema on exam. Troponins and BNP were elevated. VBG with metabolic acidosis. ECG with sinus tach. CTA negative for PE but demonstrated bilateral infiltrates concerning for PNA vs edema. BiPAP, IV lasix, CTX, azithro, and heparin initiated. Abx d/c given improvement with diuresis and BiPAP. Cardiology consulted and recommended trending trops and echo. Echo demonstrated LVEF 25-30% with suspicion for Takotsubo. Trops peaked at 1295. Cardiac cath found moderate nonischemic CM. Cardiac MRI then obtained which  showed likely Takotsubo CM.  Per cardiology patient should continue losartan 12 mg, Farxiga 10 mg daily and Coreg 3.125 mg twice daily was added.  By discharge, patient was stable and will followup with cardiology OP.   Pneumonia CTA findings suggestive of possible multifocal pneumonia. She was initially placed on ceftriaxone and azithromycin. Abx d/c when found to be RSV positive. By discharge, patient was stable and not requiring oxygen.   Hypomagnesemia Mg 1.3 on admission; it was repleted over course of admission.  Other chronic conditions: Alcohol use: no signs of withdrawal; continued thiamine, folate, MVI over admission Normocytic anemia: stable over admission around baseline  Issues for follow up: Ensure ability to obtain and take new CHF medications: losartan, farxiga, and corlanor Recheck Cr outpatient  Continue losartan 12. Mg, Farxia 10 mg daily. Added coreg twice daily bid    Disposition: home  Discharge Condition: stable  Discharge Exam:  Vitals:   01/12/22 0857 01/12/22 1521  BP: 137/88 (!) 132/96  Pulse:  74  Resp:    Temp:    SpO2:       Significant Procedures: catheterization   Significant Labs and Imaging:  Recent Labs  Lab 01/11/22 0103 01/11/22 1834 01/12/22 0059 01/12/22 1459  WBC 5.3  --  5.1 5.3  HGB 6.9* 9.6* 8.4* 9.7*  HCT 20.0* 27.6* 23.9* 28.7*  PLT 138*  --  159 196   Recent Labs  Lab 01/11/22 0103 01/12/22 0059  NA 140 139  K 3.4* 3.5  CL 106 106  CO2 25 23  GLUCOSE 94 92  BUN 6 6  CREATININE 1.11* 1.26*  CALCIUM 8.1* 8.2*  MG 1.8 1.8    VAS LE [place]: negative   CTAP: Left lower lobe airspace opacity is noted concerning  for pneumonia. Hepatic steatosis. No other definite abnormality seen in the abdomen or pelvis on these unenhanced images.  Results/Tests Pending at Time of Discharge: none  Discharge Medications:  Allergies as of 01/12/2022       Reactions   Sumatriptan Anaphylaxis   High blood pressure and numb    Other    Bananas-stomach cramps        Medication List     STOP taking these medications    dicyclomine 10 MG capsule Commonly known as: BENTYL   ferrous sulfate 325 (65 FE) MG tablet   lisinopril-hydrochlorothiazide 10-12.5 MG tablet Commonly known as: ZESTORETIC       TAKE these medications    acetaminophen 500 MG tablet Commonly known as: TYLENOL Take 1 tablet (500 mg total) by mouth every 8 (eight) hours as needed for mild pain.   benzonatate 100 MG capsule Commonly known as: TESSALON Take 1 capsule (100 mg total) by mouth 3 (three) times daily as needed for cough. What changed: when to take this   carvedilol 3.125 MG tablet Commonly known as: COREG Take 1 tablet (3.125 mg total) by mouth 2 (two) times daily with a meal.   dapagliflozin propanediol 10 MG Tabs tablet Commonly known as: FARXIGA Take 1 tablet (10 mg total) by mouth in the morning. Start taking on: January 13, 2022   diphenhydrAMINE 25 MG tablet Commonly known as: BENADRYL Take 25 mg by mouth every 6 (six) hours as needed for allergies, sleep or itching.   famotidine 20 MG tablet Commonly known as: PEPCID Take 1 tablet (20 mg total) by mouth 2 (two) times daily. What changed: See the new instructions.   folic acid 1 MG tablet Commonly known as: FOLVITE Take 1 tablet (1 mg total) by mouth daily. Start taking on: January 13, 2022   furosemide 40 MG tablet Commonly known as: LASIX Take 1 tablet (40 mg total) by mouth daily. Start taking on: January 13, 2022   gabapentin 300 MG capsule Commonly known as: NEURONTIN Take 1 capsule (300 mg total) by mouth 3 (three) times daily. What changed:  how much to take when to take this additional instructions   losartan 25 MG tablet Commonly known as: COZAAR Take 0.5 tablets (12.5 mg total) by mouth daily at 10 pm.   naltrexone 50 MG tablet Commonly known as: DEPADE Take 50 mg by mouth daily.   nicotine 14 mg/24hr patch Commonly known as:  NICODERM CQ - dosed in mg/24 hours Place 1 patch (14 mg total) onto the skin daily. Start taking on: January 13, 2022   ondansetron 4 MG tablet Commonly known as: Zofran Take 1 tablet (4 mg total) by mouth every 8 (eight) hours as needed for nausea or vomiting.   pantoprazole 40 MG tablet Commonly known as: PROTONIX Take 1 tablet (40 mg total) by mouth daily.   spironolactone 25 MG tablet Commonly known as: ALDACTONE Take 0.5 tablets (12.5 mg total) by mouth daily. Start taking on: January 13, 2022   thiamine 100 MG tablet Commonly known as: Vitamin B-1 Take 1 tablet (100 mg total) by mouth daily. Start taking on: January 13, 2022   traZODone 50 MG tablet Commonly known as: DESYREL Take 1 tablet (50 mg total) by mouth at bedtime as needed for sleep.               Durable Medical Equipment  (From admission, onward)           Start  Ordered   01/11/22 1633  For home use only DME 4 wheeled rolling walker with seat  Once       Question:  Patient needs a walker to treat with the following condition  Answer:  Weak   01/11/22 1632   01/10/22 1533  For home use only DME Walker rolling  Once       Question Answer Comment  Walker: Other   Comments rollator   Patient needs a walker to treat with the following condition Gait abnormality      01/10/22 1533            Discharge Instructions: Please refer to Patient Instructions section of EMR for full details.  Patient was counseled important signs and symptoms that should prompt return to medical care, changes in medications, dietary instructions, activity restrictions, and follow up appointments.   Follow-Up Appointments:  Follow-up Gilbert, Red[place], NP Follow up on 01/23/2022.   Why: 10 AM Contact information: [address], [place]. [place] [address] [phone]                 Alen Bleacher, MD 01/12/2022, 7:43 PM PGY-2, [employer]

## 2022-01-12 NOTE — Progress Notes (Signed)
Physical Therapy Treatment and DISCHARGE  Patient Details Name: JACKALYN HAITH MRN: [id number] DOB: Mar 13, 1976 Today's Date: 01/12/2022   History of Present Illness Pt is 46 yo female admitted 01/05/22 with CHF exacerbation and RSV with possible CAP.  Pt with hx of HTN, anemia, ETOH and cocaine abuse.    PT Comments    PT progressing well towards all goals. Pt now functioning mod I without AD. Pt just ambulated with mobility specialist about 5 min prior to PT arriving and was able to ambulate 200' with PT and complete negotiation of a flight of stairs with R hand rail to enter mothers home. Pt continues with cough however VSS. SPO2 >93% on RA, HR 100bpm s/p PT session. Pt with no further acute PT needs at this time. PT signing off, please re-consult if needed in future.     Recommendations for follow up therapy are one component of a multi-disciplinary discharge planning process, led by the attending physician.  Recommendations may be updated based on patient status, additional functional criteria and insurance authorization.  Follow Up Recommendations  No PT follow up (pt declined outpt PT for back issues - reports has tried in past)     Assistance Recommended at Discharge PRN  Patient can return home with the following Assistance with cooking/housework;Help with stairs or ramp for entrance   Equipment Recommendations       Recommendations for Other Services       Precautions / Restrictions Precautions Precautions: None Restrictions Weight Bearing Restrictions: No     Mobility  Bed Mobility Overal bed mobility: Modified Independent Bed Mobility: Supine to Sit, Sit to Supine     Supine to sit: Modified independent (Device/Increase time), HOB elevated Sit to supine: Modified independent (Device/Increase time), HOB elevated   General bed mobility comments: HOB elevated, no assist    Transfers Overall transfer level: Needs assistance Equipment used: None Transfers: Sit  to/from Stand Sit to Stand: Modified independent (Device/Increase time)           General transfer comment: no difficulty    Ambulation/Gait Ambulation/Gait assistance: Modified independent (Device/Increase time), Supervision Gait Distance (Feet): 200 Feet Assistive device: None Gait Pattern/deviations: Decreased stance time - left, Antalgic, Step-through pattern, Decreased stride length Gait velocity: decreased Gait velocity interpretation: 1.31 - 2.62 ft/sec, indicative of limited community ambulator   General Gait Details: pt with antalgia however reports "I have arthritis in both knees."   Stairs Stairs: Yes Stairs assistance: Supervision Stair Management: One rail Right, Step to pattern, Forwards Number of Stairs: 12 General stair comments: DOE, but able to complete without rest break, increased time but steady   Wheelchair Mobility    Modified Rankin (Stroke Patients Only)       Balance Overall balance assessment: Needs assistance Sitting-balance support: No upper extremity supported Sitting balance-Leahy Scale: Good     Standing balance support: No upper extremity supported Standing balance-Leahy Scale: Good                              Cognition Arousal/Alertness: Awake/alert Behavior During Therapy: WFL for tasks assessed/performed Overall Cognitive Status: Within Functional Limits for tasks assessed                                          Exercises      General Comments General comments (skin  integrity, edema, etc.): VSS on RA, HR 100bpm s/p stair negotiation, SpO2 >95% on RA      Pertinent Vitals/Pain Pain Assessment Pain Assessment: Faces Faces Pain Scale: Hurts a little bit Pain Location: "all over" Pain Descriptors / Indicators: Discomfort    Home Living                          Prior Function            PT Goals (current goals can now be found in the care plan section) Acute Rehab PT  Goals Patient Stated Goal: return home PT Goal Formulation: With patient Time For Goal Achievement: 01/24/22 Potential to Achieve Goals: Good Progress towards PT goals: Goals met/education completed, patient discharged from PT    Frequency    Min 3X/week      PT Plan Current plan remains appropriate;Frequency needs to be updated    Co-evaluation              AM-PAC PT "6 Clicks" Mobility   Outcome Measure  Help needed turning from your back to your side while in a flat bed without using bedrails?: None Help needed moving from lying on your back to sitting on the side of a flat bed without using bedrails?: None Help needed moving to and from a bed to a chair (including a wheelchair)?: None Help needed standing up from a chair using your arms (e.g., wheelchair or bedside chair)?: None Help needed to walk in hospital room?: None Help needed climbing 3-5 steps with a railing? : A Little 6 Click Score: 23    End of Session Equipment Utilized During Treatment: Gait belt Activity Tolerance: Patient tolerated treatment well Patient left: in bed;with call bell/phone within reach Nurse Communication: Mobility status PT Visit Diagnosis: Muscle weakness (generalized) (M62.81)     Time: 3338-3291 PT Time Calculation (min) (ACUTE ONLY): 13 min  Charges:  $Gait Training: 8-22 mins                     Kittie Plater, PT, DPT Acute Rehabilitation Services Secure chat preferred Office #: [phone]    Berline Lopes 01/12/2022, 9:48 AM

## 2022-01-12 NOTE — Discharge Instructions (Addendum)
Dear Kristin [name],   Thank you for letting us participate in your care! In this section, you will find a brief hospital admission summary of why you were admitted to the hospital and post-hospital plan.  You were admitted because you were experiencing shortness of breath and cough with leg swelling.  You were found to have heart failure and Takotsubo disease.  Treated with medication to improve your heart function.  You were discharged because your symptoms significantly improved and feeling much better.  Please follow-up with your PCP.  POST-HOSPITAL & CARE INSTRUCTIONS Please let PCP/Specialists know of any changes in medications that were made.  Please see medications section of this packet for any medication changes.   DOCTOR'S APPOINTMENTS & FOLLOW UP Future Appointments  Date Time Provider [place]  01/23/2022 10:00 AM [name], NP PCV-PCV None  03/17/2022  3:00 PM [name], [name], MD CPR-PRMA CPR     Thank you for choosing [facility] Take care and be well!  [facility]  [place], [place] [address] [phone]

## 2022-01-12 NOTE — Progress Notes (Signed)
Nurse requested Mobility Specialist to perform oxygen saturation test with pt which includes removing pt from oxygen both at rest and while ambulating.  Below are the results from that testing.     Patient Saturations on Room Air at Rest = spO2 96%%  Patient Saturations on Room Air while Ambulating = sp02 93% .   Patient Saturations on N/A Liters of oxygen while Ambulating = sp02 N/A%  At end of testing pt left in room on 0  Liters of oxygen.  Reported results to nurse.

## 2022-01-18 ENCOUNTER — Ambulatory Visit (INDEPENDENT_AMBULATORY_CARE_PROVIDER_SITE_OTHER): Payer: Commercial Managed Care - HMO | Admitting: Student

## 2022-01-18 VITALS — BP 132/70 | HR 71 | Temp 97.7°F | Ht 64.5 in | Wt 157.2 lb

## 2022-01-18 DIAGNOSIS — I10 Essential (primary) hypertension: Secondary | ICD-10-CM | POA: Diagnosis not present

## 2022-01-18 DIAGNOSIS — I5021 Acute systolic (congestive) heart failure: Secondary | ICD-10-CM

## 2022-01-18 NOTE — Patient Instructions (Addendum)
It was great to see you! Thank you for allowing me to participate in your care!   Our plans for today:  - Don't take lisinopril only losartan - Please pick up farxiga and start this - If not improving please call clinic back - If gaining 3 pounds in a day this is not normal and please return to care - Return to care if have chest pain  Take care and seek immediate care sooner if you develop any concerns.  Levin Erp, MD

## 2022-01-18 NOTE — Progress Notes (Signed)
    SUBJECTIVE:   CHIEF COMPLAINT / HPI: Hospital F/u 10/26-11/2  Found to have nonischemic cardiomyopathy/Taskotsubo + new onset HFrEF in hospital. Also had CT findings of PNA and was treated with CTX and AZT and this was stopped when RSV+. Did not require oxygen at discharge. Last night or night before could not breathe well and could not catch breath when laying down flat. Sitting up helped it. Coughing up mucus still. No blood in sputum. Still really short of breath when walking and going up and down steps. No fevers or vomiting. Staying hydrated.  Fluid in legs much less than previously and no weight gain. No chest pain currently.  She says that she has only smoked 3 cigarettes since being discharged from the hospital and is trying to stop.  Not currently interested in medications to help her.  Discharge recommendations: Ensure ability to obtain and take new CHF medications: losartan, farxiga, and corlanor (has not started her farxiga because she was unsure of what this was, says that has been taking her losartan but has also been taking her home lisinopril which I told her to stop, did not bring her medications with her today says she is unsure about the Corlanor) Recheck Cr outpatient - BMP ordered Continue losartan, Farxiga 10 mg daily (farxiga not picked up-she thought that she did not need this because she does not have diabetes) Added coreg twice daily bid-BP tolerating    PERTINENT  PMH / PSH: HFrEF  OBJECTIVE:   BP 132/70   Pulse 71   Temp 97.7 F (36.5 C)   Ht 5' 4.5" (1.638 m)   Wt 157 lb 3.2 oz (71.3 kg)   SpO2 99%   BMI 26.57 kg/m   General: Nontoxic, NAD, awake, alert, responsive to questions Head: Normocephalic atraumatic CV: Regular rate and rhythm no murmurs rubs or gallops Respiratory: Clear to ausculation bilaterally, no wheezes rales or crackles, chest rises symmetrically,  no increased work of breathing on RA Abdomen: Soft, non-tender, non-distended,  normoactive bowel sounds  Extremities: Moves upper and lower extremities freely, no pitting edema on LE  ASSESSMENT/PLAN:   Acute HFrEF (heart failure with reduced ejection fraction) (HCC) Patient's weight is down from discharge at 157 from 160 on date of discharge.  She overall does not appear fluid overloaded.  Ambulatory pulse oximetry done in office without issues patient was saturating in high 90s.  Gust medications that patient should be on and close follow-up with cardiology. Cough likely post-viral from RSV -Discussed with patient to start her farxiga 10 mg daily -Continue losartan, discussed stopping her home lisinopril -BMP -Discussed return/ED precautions including weight gain, chest pain, worsening shortness of breath, fevers/systemic symptoms    Kristin Erp, MD [facility]

## 2022-01-18 NOTE — Assessment & Plan Note (Signed)
Patient's weight is down from discharge at 157 from 160 on date of discharge.  She overall does not appear fluid overloaded.  Ambulatory pulse oximetry done in office without issues patient was saturating in high 90s.  Gust medications that patient should be on and close follow-up with cardiology. Cough likely post-viral from RSV -Discussed with patient to start her farxiga 10 mg daily -Continue losartan, discussed stopping her home lisinopril -BMP -Discussed return/ED precautions including weight gain, chest pain, worsening shortness of breath, fevers/systemic symptoms

## 2022-01-19 LAB — BASIC METABOLIC PANEL
BUN/Creatinine Ratio: 11 (ref 9–23)
BUN: 14 mg/dL (ref 6–24)
CO2: 21 mmol/L (ref 20–29)
Calcium: 9.1 mg/dL (ref 8.7–10.2)
Chloride: 103 mmol/L (ref 96–106)
Creatinine, Ser: 1.28 mg/dL — ABNORMAL HIGH (ref 0.57–1.00)
Glucose: 82 mg/dL (ref 70–99)
Potassium: 3.7 mmol/L (ref 3.5–5.2)
Sodium: 146 mmol/L — ABNORMAL HIGH (ref 134–144)
eGFR: 52 mL/min/{1.73_m2} — ABNORMAL LOW (ref >59)

## 2022-01-23 ENCOUNTER — Ambulatory Visit: Payer: Commercial Managed Care - HMO

## 2022-01-30 ENCOUNTER — Other Ambulatory Visit: Payer: Self-pay

## 2022-02-01 ENCOUNTER — Other Ambulatory Visit: Payer: Self-pay | Admitting: Student

## 2022-02-01 MED ORDER — FOLIC ACID 1 MG PO TABS: 1.0000 mg | ORAL_TABLET | Freq: Every day | ORAL | 0 refills | Status: AC

## 2022-02-01 MED ORDER — SPIRONOLACTONE 25 MG PO TABS: 12.5000 mg | ORAL_TABLET | Freq: Every day | ORAL | 0 refills | Status: DC

## 2022-02-01 MED ORDER — CARVEDILOL 3.125 MG PO TABS: 3.1250 mg | ORAL_TABLET | Freq: Two times a day (BID) | ORAL | 0 refills | Status: DC

## 2022-02-01 MED ORDER — FAMOTIDINE 20 MG PO TABS: 20.0000 mg | ORAL_TABLET | Freq: Two times a day (BID) | ORAL | 0 refills | Status: DC

## 2022-02-01 MED ORDER — FUROSEMIDE 40 MG PO TABS: 40.0000 mg | ORAL_TABLET | Freq: Every day | ORAL | 0 refills | Status: DC

## 2022-02-01 MED ORDER — VITAMIN B-1 100 MG PO TABS: 100.0000 mg | ORAL_TABLET | Freq: Every day | ORAL | 0 refills | Status: AC

## 2022-02-09 ENCOUNTER — Other Ambulatory Visit: Payer: Self-pay

## 2022-02-09 ENCOUNTER — Emergency Department (HOSPITAL_BASED_OUTPATIENT_CLINIC_OR_DEPARTMENT_OTHER): Payer: Commercial Managed Care - HMO | Admitting: Radiology

## 2022-02-09 ENCOUNTER — Emergency Department (HOSPITAL_BASED_OUTPATIENT_CLINIC_OR_DEPARTMENT_OTHER)
Admission: EM | Admit: 2022-02-09 | Discharge: 2022-02-09 | Disposition: A | Discharge status: Home / Self Care | Payer: Commercial Managed Care - HMO | Attending: Emergency Medicine | Admitting: Emergency Medicine

## 2022-02-09 DIAGNOSIS — M545 Low back pain, unspecified: Secondary | ICD-10-CM | POA: Insufficient documentation

## 2022-02-09 DIAGNOSIS — M25512 Pain in left shoulder: Secondary | ICD-10-CM | POA: Diagnosis not present

## 2022-02-09 DIAGNOSIS — Y92410 Unspecified street and highway as the place of occurrence of the external cause: Secondary | ICD-10-CM | POA: Diagnosis not present

## 2022-02-09 DIAGNOSIS — M546 Pain in thoracic spine: Secondary | ICD-10-CM | POA: Insufficient documentation

## 2022-02-09 DIAGNOSIS — M25511 Pain in right shoulder: Secondary | ICD-10-CM | POA: Diagnosis not present

## 2022-02-09 DIAGNOSIS — M542 Cervicalgia: Secondary | ICD-10-CM | POA: Insufficient documentation

## 2022-02-09 DIAGNOSIS — Z79899 Other long term (current) drug therapy: Secondary | ICD-10-CM | POA: Diagnosis not present

## 2022-02-09 LAB — PREGNANCY, URINE: Preg Test, Ur: NEGATIVE

## 2022-02-09 MED ORDER — METHOCARBAMOL 500 MG PO TABS: 500.0000 mg | ORAL_TABLET | Freq: Two times a day (BID) | ORAL | 0 refills | Status: DC

## 2022-02-09 MED ORDER — NAPROXEN 500 MG PO TABS: 500.0000 mg | ORAL_TABLET | Freq: Two times a day (BID) | ORAL | 0 refills | Status: DC
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
## 2022-02-16 ENCOUNTER — Ambulatory Visit (INDEPENDENT_AMBULATORY_CARE_PROVIDER_SITE_OTHER): Payer: Commercial Managed Care - HMO | Admitting: Family Medicine

## 2022-02-16 ENCOUNTER — Ambulatory Visit: Payer: Commercial Managed Care - HMO | Admitting: Family Medicine

## 2022-02-16 DIAGNOSIS — K0889 Other specified disorders of teeth and supporting structures: Secondary | ICD-10-CM | POA: Diagnosis not present

## 2022-02-16 DIAGNOSIS — M48062 Spinal stenosis, lumbar region with neurogenic claudication: Secondary | ICD-10-CM | POA: Diagnosis not present

## 2022-02-16 DIAGNOSIS — M170 Bilateral primary osteoarthritis of knee: Secondary | ICD-10-CM

## 2022-02-16 DIAGNOSIS — L2082 Flexural eczema: Secondary | ICD-10-CM | POA: Diagnosis not present

## 2022-02-16 MED ORDER — TRIAMCINOLONE ACETONIDE 0.1 % EX CREA: 1.0000 | TOPICAL_CREAM | Freq: Two times a day (BID) | CUTANEOUS | 0 refills | Status: DC

## 2022-02-16 MED ORDER — HYDROCORTISONE 1 % EX LOTN: 1.0000 | TOPICAL_LOTION | Freq: Two times a day (BID) | CUTANEOUS | 0 refills | Status: DC

## 2022-02-16 NOTE — Progress Notes (Signed)
    SUBJECTIVE:   CHIEF COMPLAINT / HPI:   Kristin [name] is a pleasant 46 year old woman who presents mainly for rash, but also discussed a tooth issue, leg pain from spinal stenosis, and knee pain.   Rash She has noticed a pruritic rash over her flexural surfaces: elbows and knees, in addition to legs. 3-4 day duration. Has tried cortisone cream and benadryl without much relief. No known environmental exposures. No new soaps, detergents, etc. No new medications. No prodromal illness, has felt well.   Tooth problem Reports chronic dental issues, with acute worsening over the last couple weeks. Not currently established with a dentist due to lack of insurance - but now has coverage and is looking for dental care. The worst issue is a front left inferior tooth that broke and is causing pain. Some bilateral cheek swelling over problem areas. No fever, chills, throat swelling, difficulty swallowing or breathing.   Knee pain, leg pain Patient mentions chronic knee and leg pain, asks for referrals. Reports chronic knee OA bilaterally. Would perhaps like a referral to orthopedics.   PERTINENT  PMH / PSH: ChF, HTN, migraine, GERD, primary OA of bilateral knees, bipolar 1 disorder, lumbar stenosis  OBJECTIVE:   There were no vitals taken for this visit.   Gen: awake, alert, NAD HEENT: sclera anicteric and non-injected, bilateral TM pearly pink and unremarkable, nares without drainage, oral mucosa moist, poor dentition with two broken teeth, no identifiable abscesses or infections of buccal mucosa/lips/gums/teeth Neck: no palpable lymphadenopathy, soft, supple Cardiac: RRR Resp: CTAB Extremities: TTP over medial and lateral joint lines of bilateral knees, bilateral anterior and lateral knee swelling  ASSESSMENT/PLAN:   Tooth ache Poor dentition with cracked teeth. No signs of active infection, no antibiotics indicated at this time. Provided with list of dentists who take Medicaid.   Spinal  stenosis of lumbar region with neurogenic claudication No changes in neurogenic claudication. No red flags, does not have incontinence or saddle anesthesia. Previously referred to neurosurgery for this issue. Recommend follow up as intended. Will provide information in AVS.   Primary osteoarthritis of both knees Patient requests referral to orthopedics. Chart review shows she was referred to sports medicine and underwent one injection previously. Was asked to obtain knee XR but those have not been completed yet. Discussed with patient, recommended to complete knee XR first and then follow back up with sports medicine. Current XR orders are for [facility] radiology. Patient provided phone number for [facility] radiology scheduling and sports med clinic information.   Eczema Appears to be flexural eczema. Patient reports history of this but long time ago. No current creams. Will rx hydrocortisone for face and triamcinolone for body.     Kristin Pho, MD [facility]

## 2022-02-16 NOTE — Patient Instructions (Addendum)
It was wonderful to see you today. Thank you for allowing me to be a part of your care. Below is a short summary of what we discussed at your visit today:  Rash  Looks like eczema.  Use the triamcinolone on your arm and anywhere below neck.  Use hydrocortisone on the face.   Neurosurgery referral Below is the information for the neurosurgeon. Please call back and update your insurance, make a new appointment. Make sure they have the info they need.  Susanne Borders, [place]  [address]  [address]  [facility] [place], [place] [address]  [phone] (Work)  [phone] (Fax)   Knee pain Go to [facility] hospital to have your knee x-rays done. Please call [name] to schedule. We have ordered knee x-rays for both left and right knees then both knees together while standing. Schedule Your Imaging Appointment Call [phone] for information on scheduling your imaging appointments.  After the knee x-rays are complete, please call [name] to get another appointment. You last saw Dr. Pearletha Forge.  [facility] Address: [address] [place], [place], [place] [address] Hours:  Closes soon ? 4:30?PM ? Opens 8?AM Fri Phone: [phone]  Please bring all of your medications to every appointment!  If you have any questions or concerns, please do not hesitate to contact us via phone or MyChart message.   Fayette Pho, MD

## 2022-02-18 ENCOUNTER — Encounter: Payer: Self-pay | Admitting: Family Medicine

## 2022-02-18 NOTE — Assessment & Plan Note (Signed)
Poor dentition with cracked teeth. No signs of active infection, no antibiotics indicated at this time. Provided with list of dentists who take Medicaid.

## 2022-02-18 NOTE — Assessment & Plan Note (Signed)
Patient requests referral to orthopedics. Chart review shows she was referred to sports medicine and underwent one injection previously. Was asked to obtain knee XR but those have not been completed yet. Discussed with patient, recommended to complete knee XR first and then follow back up with sports medicine. Current XR orders are for [facility] radiology. Patient provided phone number for [facility] radiology scheduling and sports med clinic information.

## 2022-02-18 NOTE — Assessment & Plan Note (Signed)
No changes in neurogenic claudication. No red flags, does not have incontinence or saddle anesthesia. Previously referred to neurosurgery for this issue. Recommend follow up as intended. Will provide information in AVS.

## 2022-02-18 NOTE — Assessment & Plan Note (Signed)
Appears to be flexural eczema. Patient reports history of this but long time ago. No current creams. Will rx hydrocortisone for face and triamcinolone for body.

## 2022-02-24 ENCOUNTER — Ambulatory Visit: Payer: Commercial Managed Care - HMO | Admitting: Student

## 2022-02-24 NOTE — Progress Notes (Deleted)
    SUBJECTIVE:   CHIEF COMPLAINT / HPI:   Lower back pain    PERTINENT  PMH / PSH: ***  OBJECTIVE:   There were no vitals taken for this visit.  ***  ASSESSMENT/PLAN:   No problem-specific Assessment & Plan notes found for this encounter.     Levin Erp, MD [facility]

## 2022-02-27 ENCOUNTER — Encounter: Payer: Self-pay | Admitting: Student

## 2022-02-27 ENCOUNTER — Ambulatory Visit (INDEPENDENT_AMBULATORY_CARE_PROVIDER_SITE_OTHER): Payer: Commercial Managed Care - HMO | Admitting: Student

## 2022-02-27 VITALS — BP 138/88 | HR 97 | Wt 160.2 lb

## 2022-02-27 DIAGNOSIS — R350 Frequency of micturition: Secondary | ICD-10-CM

## 2022-02-27 DIAGNOSIS — F5104 Psychophysiologic insomnia: Secondary | ICD-10-CM

## 2022-02-27 DIAGNOSIS — M549 Dorsalgia, unspecified: Secondary | ICD-10-CM

## 2022-02-27 DIAGNOSIS — M5442 Lumbago with sciatica, left side: Secondary | ICD-10-CM

## 2022-02-27 DIAGNOSIS — G8929 Other chronic pain: Secondary | ICD-10-CM

## 2022-02-27 DIAGNOSIS — M5441 Lumbago with sciatica, right side: Secondary | ICD-10-CM

## 2022-02-27 LAB — POCT URINALYSIS DIP (CLINITEK)
Bilirubin, UA: NEGATIVE
Blood, UA: NEGATIVE
Glucose, UA: NEGATIVE mg/dL
Ketones, POC UA: NEGATIVE mg/dL
Leukocytes, UA: NEGATIVE
Nitrite, UA: NEGATIVE
POC PROTEIN,UA: NEGATIVE
Spec Grav, UA: 1.01 (ref 1.010–1.025)
Urobilinogen, UA: 0.2 E.U./dL
pH, UA: 5.5 (ref 5.0–8.0)

## 2022-02-27 MED ORDER — GABAPENTIN 300 MG PO CAPS: 300.0000 mg | ORAL_CAPSULE | Freq: Three times a day (TID) | ORAL | 1 refills | Status: DC

## 2022-02-27 MED ORDER — TRAZODONE HCL 50 MG PO TABS: 50.0000 mg | ORAL_TABLET | Freq: Every evening | ORAL | 1 refills | Status: DC | PRN

## 2022-02-27 NOTE — Assessment & Plan Note (Addendum)
Patient presenting with chronic lower back pain with bilateral lower extremity paresthesia which she says affects her sleep at night.  She endorses intermittent falls and vague report of bowel and urinary incontinence with most recent episode of incontinence being 6 months ago.  Recent lumbar spine x-ray showed mild to moderate degenerative disease.  Good muscle strength on exam and sensation intact. Unclear cause of patient's lower back pain but we will obtain MRI to further evaluate chronic lower back pain. Ontained UA for report of increased voiding, UA was negative. Likely secondary to lasix use.    -Adjusted home gabapentin to 300 mg in the morning, 600 mg p.m. before bed. -Please order for lumbar spine MRI -Refilled Home trazodone to help with sleep -Ambulatory referral for physical therapy

## 2022-02-27 NOTE — Patient Instructions (Signed)
It was wonderful to see you today. Thank you for allowing me to be a part of your care. Below is a short summary of what we discussed at your visit today:  I have placed in order for MRI of your back.   I have adjusted your gabapentin and you will take 1 tablet in the morning and 2 in the evening to help your back pain and help you sleep better.  I have also refilled your trazodone which should help with your sleep.  I believe you will benefit from physical therapy.  I have placed the referral for a physical therapy.  Please bring all of your medications to every appointment!  If you have any questions or concerns, please do not hesitate to contact us via phone or MyChart message.   Jerre Simon, MD [name]
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
## 2022-02-28 ENCOUNTER — Telehealth: Payer: Self-pay

## 2022-02-28 NOTE — Telephone Encounter (Signed)
Patient calls nurse line regarding results of urine sample obtained yesterday.   Advised of negative results. All questions answered.   Veronda Prude, RN

## 2022-03-02 ENCOUNTER — Ambulatory Visit: Payer: Commercial Managed Care - HMO

## 2022-03-02 NOTE — Therapy (Incomplete)
OUTPATIENT PHYSICAL THERAPY THORACOLUMBAR EVALUATION   Patient Name: Kristin [name] MRN: [id number] DOB:[birth year], 46 y.o., female Today's Date: 03/02/2022  END OF SESSION:   Past Medical History:  Diagnosis Date   Acid reflux    ACL tear 2011   not sure which side   Acute lateral meniscus tear of right knee    Anxiety    Arthritis    Back pain    slipped disc lower back lumbar   Bipolar 1 disorder (HCC)    Depression    Dysfunctional uterine bleeding    Eczema    Hypertension    Insomnia    Migraine headache    Neuromuscular disorder (HCC)    leg cramps   Respiratory distress 01/12/2022   Past Surgical History:  Procedure Laterality Date   BIOPSY  09/19/2017   Procedure: BIOPSY;  Surgeon: Kathi Der, MD;  Location: [facility];  Service: Gastroenterology;;   BIOPSY  10/15/2019   Procedure: BIOPSY;  Surgeon: Charlott Rakes, MD;  Location: [facility];  Service: Endoscopy;;   COLONOSCOPY N/A 10/15/2019   Procedure: COLONOSCOPY;  Surgeon: Charlott Rakes, MD;  Location: [facility];  Service: Endoscopy;  Laterality: N/A;   DILITATION & CURRETTAGE/HYSTROSCOPY WITH HYDROTHERMAL ABLATION N/A 04/04/2017   Procedure: DILATATION & CURETTAGE/HYSTEROSCOPY WITH HYDROTHERMAL ABLATION;  Surgeon: Reva Bores, MD;  Location: [facility];  Service: Gynecology;  Laterality: N/A;   ESOPHAGOGASTRODUODENOSCOPY N/A 10/15/2019   Procedure: ESOPHAGOGASTRODUODENOSCOPY (EGD);  Surgeon: Charlott Rakes, MD;  Location: [facility];  Service: Endoscopy;  Laterality: N/A;   ESOPHAGOGASTRODUODENOSCOPY (EGD) WITH PROPOFOL N/A 09/19/2017   Procedure: ESOPHAGOGASTRODUODENOSCOPY (EGD) WITH PROPOFOL;  Surgeon: Kathi Der, MD;  Location: [facility];  Service: Gastroenterology;  Laterality: N/A;   KNEE ARTHROSCOPY WITH LATERAL MENISECTOMY Right 09/30/2019   Procedure: KNEE ARTHROSCOPY WITH LATERAL MENISECTOMY;  Surgeon: Sheral Apley, MD;  Location: [facility];  Service: Orthopedics;  Laterality: Right;   RIGHT/LEFT HEART CATH AND CORONARY ANGIOGRAPHY N/A 01/09/2022   Procedure: RIGHT/LEFT HEART CATH AND CORONARY ANGIOGRAPHY;  Surgeon: Elder Negus, MD;  Location: [facility];  Service: Cardiovascular;  Laterality: N/A;   TUBAL LIGATION  yrs ago   Patient Active Problem List   Diagnosis Date Noted   Physical deconditioning 01/10/2022   Stress-induced cardiomyopathy    Acute congestive heart failure (HCC)    RSV (respiratory syncytial virus pneumonia) 01/07/2022   Acute HFrEF (heart failure with reduced ejection fraction) (HCC) 01/06/2022   Substance abuse (HCC) 01/06/2022   Sepsis with acute hypoxic respiratory failure without septic shock, due to unspecified organism (HCC) 01/06/2022   Acute respiratory failure (HCC) 01/06/2022   Spinal stenosis of lumbar region with neurogenic claudication 11/25/2021   Generalized abdominal pain 10/26/2021   History of recurrent UTIs 04/19/2021   Primary osteoarthritis of both knees 12/02/2020   Chronic bilateral low back pain with bilateral sciatica 12/02/2020   Gastroesophageal reflux disease without esophagitis 12/02/2020   Functional diarrhea    Migraine headache    Chronic anemia    Recurrent UTI 08/06/2019   Esophagitis determined by endoscopy    Generalized anxiety disorder 08/23/2015   Osteoarthritis of right knee 12/21/2014   Allergic rhinitis 06/12/2012   Eczema 03/26/2012   Alcohol use disorder, mild, abuse 10/26/2010   Bipolar 1 disorder (HCC) 10/09/2010   Panic attacks 10/09/2010   Insomnia 06/29/2009   TOBACCO USER 01/23/2009   Obesity 10/29/2008   Essential hypertension 12/20/2006    PCP: ***  REFERRING PROVIDER: ***  REFERRING DIAG: ***  Rationale for Evaluation and Treatment: {HABREHAB:27488}  THERAPY DIAG:  No diagnosis found.  ONSET DATE: ***  SUBJECTIVE:                                                                                                                                                                                            SUBJECTIVE STATEMENT: ***  PERTINENT HISTORY:  ***  PAIN:  Are you having pain? {OPRCPAIN:27236}  PRECAUTIONS: {Therapy precautions:24002}  WEIGHT BEARING RESTRICTIONS: {Yes ***/No:24003}  FALLS:  Has patient fallen in last 6 months? {fallsyesno:27318}  LIVING ENVIRONMENT: Lives with: {OPRC lives with:25569::"lives with their family"} Lives in: {Lives in:25570} Stairs: {opstairs:27293} Has following equipment at home: {Assistive devices:23999}  OCCUPATION: ***  PLOF: {PLOF:24004}  PATIENT GOALS: ***  NEXT MD VISIT:   OBJECTIVE:   DIAGNOSTIC FINDINGS:  ***  PATIENT SURVEYS:  {rehab surveys:24030}  SCREENING FOR RED FLAGS: Bowel or bladder incontinence: {Yes/No:304960894} Spinal tumors: {Yes/No:304960894} Cauda equina syndrome: {Yes/No:304960894} Compression fracture: {Yes/No:304960894} Abdominal aneurysm: {Yes/No:304960894}  COGNITION: Overall cognitive status: {cognition:24006}     SENSATION: {sensation:27233}  MUSCLE LENGTH: Hamstrings: Right *** deg; Left *** deg Thomas test: Right *** deg; Left *** deg  POSTURE: {posture:25561}  PALPATION: ***  LUMBAR ROM:   AROM eval  Flexion   Extension   Right lateral flexion   Left lateral flexion   Right rotation   Left rotation    (Blank rows = not tested)  LOWER EXTREMITY ROM:     {AROM/PROM:27142}  Right eval Left eval  Hip flexion    Hip extension    Hip abduction    Hip adduction    Hip internal rotation    Hip external rotation    Knee flexion    Knee extension    Ankle dorsiflexion    Ankle plantarflexion    Ankle inversion    Ankle eversion     (Blank rows = not tested)  LOWER EXTREMITY MMT:    MMT Right eval Left eval  Hip flexion    Hip extension    Hip abduction    Hip adduction    Hip internal rotation    Hip external rotation    Knee flexion    Knee extension     Ankle dorsiflexion    Ankle plantarflexion    Ankle inversion    Ankle eversion     (Blank rows = not tested)  LUMBAR SPECIAL TESTS:  {lumbar special test:25242}  FUNCTIONAL TESTS:  {Functional tests:24029}  GAIT: Distance walked: *** Assistive device utilized: {Assistive devices:23999} Level of assistance: {Levels of assistance:24026} Comments: ***  TODAY'S TREATMENT:  DATE: ***    PATIENT EDUCATION:  Education details: *** Person educated: {Person educated:25204} Education method: {Education Method:25205} Education comprehension: {Education Comprehension:25206}  HOME EXERCISE PROGRAM: ***  ASSESSMENT:  CLINICAL IMPRESSION: Patient is a *** y.o. *** who was seen today for physical therapy evaluation and treatment for ***.   OBJECTIVE IMPAIRMENTS: {opptimpairments:25111}.   ACTIVITY LIMITATIONS: {activitylimitations:27494}  PARTICIPATION LIMITATIONS: {participationrestrictions:25113}  PERSONAL FACTORS: {Personal factors:25162} are also affecting patient's functional outcome.   REHAB POTENTIAL: {rehabpotential:25112}  CLINICAL DECISION MAKING: {clinical decision making:25114}  EVALUATION COMPLEXITY: {Evaluation complexity:25115}   GOALS: Goals reviewed with patient? {yes/no:20286}  SHORT TERM GOALS: Target date: ***  *** Baseline: Goal status: {GOALSTATUS:25110}  2.  *** Baseline:  Goal status: {GOALSTATUS:25110}  3.  *** Baseline:  Goal status: {GOALSTATUS:25110}  4.  *** Baseline:  Goal status: {GOALSTATUS:25110}  5.  *** Baseline:  Goal status: {GOALSTATUS:25110}  6.  *** Baseline:  Goal status: {GOALSTATUS:25110}  LONG TERM GOALS: Target date: ***  *** Baseline:  Goal status: {GOALSTATUS:25110}  2.  *** Baseline:  Goal status: {GOALSTATUS:25110}  3.  *** Baseline:  Goal status:  {GOALSTATUS:25110}  4.  *** Baseline:  Goal status: {GOALSTATUS:25110}  5.  *** Baseline:  Goal status: {GOALSTATUS:25110}  6.  *** Baseline:  Goal status: {GOALSTATUS:25110}  PLAN:  PT FREQUENCY: {rehab frequency:25116}  PT DURATION: {rehab duration:25117}  PLANNED INTERVENTIONS: {rehab planned interventions:25118::"Therapeutic exercises","Therapeutic activity","Neuromuscular re-education","Balance training","Gait training","Patient/Family education","Self Care","Joint mobilization"}.  PLAN FOR NEXT SESSION: ***  Letitia Libra, PT, DPT, ATC 03/02/22 8:26 AM

## 2022-03-17 ENCOUNTER — Encounter
Payer: Medicaid Other | Attending: Physical Medicine and Rehabilitation | Admitting: Physical Medicine and Rehabilitation

## 2022-03-20 ENCOUNTER — Ambulatory Visit
Admission: RE | Admit: 2022-03-20 | Discharge: 2022-03-20 | Disposition: A | Discharge status: Home / Self Care | Payer: Medicaid Other | Source: Ambulatory Visit | Attending: Family Medicine | Admitting: Family Medicine

## 2022-03-20 DIAGNOSIS — G8929 Other chronic pain: Secondary | ICD-10-CM

## 2022-03-21 ENCOUNTER — Telehealth: Payer: Self-pay

## 2022-03-21 NOTE — Telephone Encounter (Signed)
Pt is requesting something to take to help her relax during an MRI. She tried to have it done yesterday but could not handle having the MRI done. Please send medication to [facility] on [address]. If you have any questions or need to talk to the pt please call, [phone]. Ottis Stain, CMA

## 2022-03-22 ENCOUNTER — Other Ambulatory Visit: Payer: Self-pay | Admitting: Student

## 2022-03-22 ENCOUNTER — Other Ambulatory Visit: Payer: Self-pay

## 2022-03-22 MED ORDER — LORAZEPAM 0.5 MG PO TABS: 0.5000 mg | ORAL_TABLET | Freq: Three times a day (TID) | ORAL | 0 refills | Status: AC

## 2022-03-22 MED ORDER — LORAZEPAM 0.5 MG PO TABS
0.5000 mg | ORAL_TABLET | Freq: Three times a day (TID) | ORAL | 0 refills | Status: DC
  Filled 2022-03-22: qty 2, 1d supply, fill #0

## 2022-03-22 NOTE — Progress Notes (Signed)
Ativan for sedation with MRI

## 2022-03-22 NOTE — Progress Notes (Signed)
Changed pharmacy to [facility] on [address]

## 2022-03-23 ENCOUNTER — Other Ambulatory Visit: Payer: Self-pay

## 2022-04-07 ENCOUNTER — Encounter: Payer: Self-pay | Admitting: Student

## 2022-04-07 ENCOUNTER — Ambulatory Visit (INDEPENDENT_AMBULATORY_CARE_PROVIDER_SITE_OTHER): Payer: Medicaid Other | Admitting: Student

## 2022-04-07 ENCOUNTER — Other Ambulatory Visit: Payer: Self-pay

## 2022-04-07 VITALS — BP 132/88 | HR 85 | Ht 64.5 in | Wt 161.0 lb

## 2022-04-07 DIAGNOSIS — R202 Paresthesia of skin: Secondary | ICD-10-CM

## 2022-04-07 DIAGNOSIS — M48062 Spinal stenosis, lumbar region with neurogenic claudication: Secondary | ICD-10-CM

## 2022-04-07 DIAGNOSIS — M1711 Unilateral primary osteoarthritis, right knee: Secondary | ICD-10-CM | POA: Diagnosis present

## 2022-04-07 DIAGNOSIS — R200 Anesthesia of skin: Secondary | ICD-10-CM

## 2022-04-07 MED ORDER — MELOXICAM 7.5 MG PO TABS
7.5000 mg | ORAL_TABLET | Freq: Every day | ORAL | 0 refills | Status: DC
  Filled 2022-04-07: qty 30, 30d supply, fill #0

## 2022-04-07 NOTE — Patient Instructions (Signed)
It was wonderful to meet you today. Thank you for allowing me to be a part of your care. Below is a short summary of what we discussed at your visit today:  For your knee pain I suspect that you could be dealing with worsening osteoarthritis in your knees.  I have sent in referral to sports medicine.    In addition to that I have sent in prescription for meloxicam which you will take once daily for 7 days.  I also recommend use of Voltaren gel over the affected knee.  You are feeling in your right foot I have sent an nerve conduction study that you would do.  You will receive a call to have that nerve studies completed.   Please bring all of your medications to every appointment!  If you have any questions or concerns, please do not hesitate to contact us via phone or MyChart message.   Alen Bleacher, MD [facility]

## 2022-04-07 NOTE — Progress Notes (Signed)
    SUBJECTIVE:   CHIEF COMPLAINT / HPI:   Patient is a 47 year old female presenting today for concerns of right knee pain that is causing difficulty with sleep at night.  According to patient this has been ongoing since her 20s with no improvement. Tried PT in the past with no success and instead aggravated her pain, and no prior steroid injection. Endorses swelling and feeling of fluid in the knee. Patient reports knee locking up occasionally and this is mostly in the morning. The locking of the knees has caused multiple falls with most recent being 1 month ago. Last right knee x-ray in 2021 showed mild osteoarthritis.  Bilateral feet numbness Patient report she's been experiencing bilateral feet numbness in the past couple of months. No pain or tingling sensation. Denies any trauma to the back or feet. Report she doesn't have any feeling in her feet but able to walk on them . She said this could be attributing to her falls. MRI Mild right L4-5 neural foraminal narrowing.   PERTINENT  PMH / PSH: Reviewed  OBJECTIVE:   Wt 161 lb (73 kg)   BMI 27.21 kg/m    Physical Exam General: Alert, well appearing, NAD Cardiovascular: RRR, No Murmurs, Normal S2/S2  Right Knee Exam Mild effusion.  No other gross deformity, ecchymoses. TTP lateral and medial joint line. Notable crepitus FROM with normal strength. Negative ant/post drawers. Pain with valgus/varus/ thessalys test. NV intact distally.  Bilateral feet: +2 Pedal pulse, strength with plantar flexion and extension 5/5 with diminished sensation  ASSESSMENT/PLAN:  Right Knee pain Given patient's history and exam findings suspect this is most likely due to osteoarthritis of the right knee.  Low concerns for meniscus injury although not completely ruled out with patient's concerns for knee buckling and locking.  Per chart review patient patient has previously had physical therapy and has had previous x-rays ordered but that was never  completed in the past. -Placed order for bilateral knee x-ray -Rx meloxicam daily for 7 days -Encourage use of Voltaren gel over the affected area -Referral to sports medicine.  Bilateral feet paresthesia Unclear cause of patient's bilateral feet numbness as reported. Given that this is a chronic issue we will obtain an nerve conduction study. Pending result have low threshold for referral referral to neurology.    Alen Bleacher, MD [place]

## 2022-04-11 ENCOUNTER — Ambulatory Visit
Admission: RE | Admit: 2022-04-11 | Discharge: 2022-04-11 | Disposition: A | Discharge status: Home / Self Care | Payer: Medicaid Other | Source: Ambulatory Visit | Attending: Family Medicine | Admitting: Family Medicine

## 2022-04-11 MED ORDER — GADOPICLENOL 0.5 MMOL/ML IV SOLN
7.5000 mL | Freq: Once | INTRAVENOUS | Status: AC | PRN
  Administered 2022-04-11: 7.5 mL via INTRAVENOUS

## 2022-04-13 ENCOUNTER — Other Ambulatory Visit: Payer: Self-pay

## 2022-05-09 ENCOUNTER — Other Ambulatory Visit: Payer: Self-pay

## 2022-05-09 ENCOUNTER — Ambulatory Visit (INDEPENDENT_AMBULATORY_CARE_PROVIDER_SITE_OTHER): Payer: Medicaid Other | Admitting: Family Medicine

## 2022-05-09 ENCOUNTER — Encounter: Payer: Self-pay | Admitting: Family Medicine

## 2022-05-09 VITALS — BP 138/90 | HR 77 | Ht 64.5 in | Wt 162.0 lb

## 2022-05-09 DIAGNOSIS — L02214 Cutaneous abscess of groin: Secondary | ICD-10-CM | POA: Diagnosis not present

## 2022-05-09 DIAGNOSIS — F319 Bipolar disorder, unspecified: Secondary | ICD-10-CM | POA: Diagnosis present

## 2022-05-09 DIAGNOSIS — J0100 Acute maxillary sinusitis, unspecified: Secondary | ICD-10-CM

## 2022-05-09 MED ORDER — DOXYCYCLINE HYCLATE 100 MG PO TABS: 100.0000 mg | ORAL_TABLET | Freq: Two times a day (BID) | ORAL | 0 refills | Status: DC

## 2022-05-09 MED ORDER — HYDROXYZINE HCL 10 MG PO TABS
10.0000 mg | ORAL_TABLET | Freq: Three times a day (TID) | ORAL | 0 refills | Status: DC | PRN
  Filled 2022-05-09: qty 60, 20d supply, fill #0

## 2022-05-09 NOTE — Assessment & Plan Note (Signed)
Would avoid zolpidem for sleep.  Will trial hydroxyzine as needed.  Refer to psychiatry to have her reestablish.

## 2022-05-09 NOTE — Patient Instructions (Addendum)
It was nice seeing you today!  Try hydroxyzine as needed for sleep and itchiness. Stop the Benadryl because it is a similar medication.  Take antibiotics as prescribed.  This antibiotic can make you more sensitive to the Saul Dorsi.  I have referred you to the psychiatrist.  Stay well, Kristin Button, MD [place] [phone]  --  Make sure to check out at the front desk before you leave today.  Please arrive at least 15 minutes prior to your scheduled appointments.  If you had blood work today, I will send you a MyChart message or a letter if results are normal. Otherwise, I will give you a call.  If you had a referral placed, they will call you to set up an appointment. Please give [place] a call if you don't hear back in the next 2 weeks.  If you need additional refills before your next appointment, please call your pharmacy first.

## 2022-05-09 NOTE — Progress Notes (Signed)
    SUBJECTIVE:   CHIEF COMPLAINT / HPI:  Chief Complaint  Patient presents with   refill ambien   sinus infection   boil in between legs    Reports congestion, cough x 1 week No fever Multiple sick contacts, daughter and granddaughter  Requesting medication for sleep, wants Ambien Amenable to psychiatry referral for bipolar disorder.  She was referred previously but did not have insurance at that time. She was diagnosed with bipolar disorder many years ago per patient.  Boils on left leg and right leg groin area (worse on left) for 5 days with some drainage (pus) Has had chills Has been shaving the area Denies fever  PERTINENT  PMH / PSH: Bipolar disorder, obesity, HFrEF, HTN, GERD  Patient Care Team: Alen Bleacher, MD as PCP - General (Family Medicine)   OBJECTIVE:   BP (!) 138/90   Pulse 77   Ht 5' 4.5" (1.638 m)   Wt 162 lb (73.5 kg)   SpO2 97%   BMI 27.38 kg/m   Physical Exam Exam conducted with a chaperone present.  Constitutional:      General: She is not in acute distress. HENT:     Head: Normocephalic and atraumatic.  Cardiovascular:     Rate and Rhythm: Normal rate and regular rhythm.  Pulmonary:     Effort: Pulmonary effort is normal. No respiratory distress.     Breath sounds: Normal breath sounds.  Musculoskeletal:     Cervical back: Neck supple.  Skin:    Comments: 5 nodules affecting the left groin region 1 cm or smaller with some fluctuance.  No surrounding erythema.  1 lesion with scant amount of purulent drainage.  Neurological:     Mental Status: She is alert.         05/09/2022    2:19 PM  Depression screen PHQ 2/9  Decreased Interest 3  Down, Depressed, Hopeless 2  PHQ - 2 Score 5  Altered sleeping 3  Tired, decreased energy 3  Change in appetite 2  Feeling bad or failure about yourself  0  Trouble concentrating 2  Moving slowly or fidgety/restless 3  Suicidal thoughts 0  PHQ-9 Score 18     {Show previous vital signs  (optional):23777}    ASSESSMENT/PLAN:   Problem List Items Addressed This Visit       Other   Bipolar 1 disorder ([place]) - Primary (Chronic)    Would avoid zolpidem for sleep.  Will trial hydroxyzine as needed.  Refer to psychiatry to have her reestablish.      Relevant Orders   Ambulatory referral to Psychiatry   Other Visit Diagnoses     Acute non-recurrent maxillary sinusitis       Relevant Medications   doxycycline (VIBRA-TABS) 100 MG tablet   Abscess of groin, left       Relevant Medications   doxycycline (VIBRA-TABS) 100 MG tablet       Multiple left groin abscesses possibly spread by shaving, could consider underlying hidradenitis suppurativa.  Will treat with antibiotics, do not feel indication for I&D currently.  Likely viral URI ongoing for 7 days but antibiotics will cover for possible bacterial sinusitis.    Return if symptoms worsen or fail to improve.   Zola Button, MD [place]

## 2022-05-12 ENCOUNTER — Other Ambulatory Visit: Payer: Self-pay

## 2022-05-16 ENCOUNTER — Other Ambulatory Visit: Payer: Self-pay

## 2022-06-01 ENCOUNTER — Encounter: Payer: Self-pay | Admitting: Student

## 2022-06-01 ENCOUNTER — Ambulatory Visit (INDEPENDENT_AMBULATORY_CARE_PROVIDER_SITE_OTHER): Payer: Medicaid Other | Admitting: Student

## 2022-06-01 VITALS — BP 132/80 | HR 74 | Ht 64.5 in | Wt 165.0 lb

## 2022-06-01 DIAGNOSIS — M5442 Lumbago with sciatica, left side: Secondary | ICD-10-CM

## 2022-06-01 DIAGNOSIS — M5441 Lumbago with sciatica, right side: Secondary | ICD-10-CM | POA: Diagnosis not present

## 2022-06-01 DIAGNOSIS — G8929 Other chronic pain: Secondary | ICD-10-CM

## 2022-06-01 MED ORDER — PREDNISONE 20 MG PO TABS: 40.0000 mg | ORAL_TABLET | Freq: Every day | ORAL | 0 refills | Status: AC

## 2022-06-01 NOTE — Assessment & Plan Note (Addendum)
Chronic lower back pain with bilateral lower extremity paresthesia which is persistent and requiring use of cane.  Reviewed prior imaging and most recent MR showed no changes from prior imaging in 2022.  She was also recently seen for knee pain and did not have any imaging done.  She cannot clearly state which pain medication she is taking and given poor kidney function I have not prescribed any further NSAIDs nor adjusted gabapentin dosing.  Limited concern for saddle paresthesia at this time.  She would benefit greatly from orthopedic surgery referral as she has not been seen for this prior and prednisone taper in the meantime given poor utility and benefit of other pain medications.  I will have her return within the next month to see PCP for medication reconciliation only.

## 2022-06-01 NOTE — Progress Notes (Signed)
  SUBJECTIVE:   CHIEF COMPLAINT / HPI:   Knee and back pain: Endorses that she is frequently using the bathroom. Denies cloudy or odor. She notes the pain is consistent every day in lower back and knees. Endorses numbness and lack of sensation in her feet completely. MR lumbar stable compared to 05/08/20 showed L4-5 degeneration with left paracentral to foraminal herniation that contacts the left L5 and L4 nerve roots without compression. She has tried [employer], motrin, Curator, tylenol arthritis. She is currently on Gabapentin. She has been using a cane on and off for the last 3 years. Stooling normally.   Of note, patient was seen for right knee pain on 04/07/2022 and was given meloxicam x 7 days in addition to order for bilateral knee x-ray and referral to sports medicine which she did not complete either of these.  At that time there are also concerns regarding her bilateral foot paresthesia with low threshold for referral to neurology.  Concern for allergic reaction: endorses rash on face and itching after trying a new shampoo which has been a product and she is allergic to bananas.  PERTINENT  PMH / PSH: Bipolar disorder, obesity, HFrEF, HTN, GERD  Patient Care Team: Alen Bleacher, MD as PCP - General (Family Medicine) OBJECTIVE:  BP 132/80   Pulse 74   Ht 5' 4.5" (1.638 m)   Wt 165 lb (74.8 kg)   SpO2 98%   BMI 27.88 kg/m  General: No acute distress MSK: Forward flexion of 45 degrees when standing, tenderness along SI joint bilaterally, numbness along bilateral lower extremities up to knees on the medial side, 3/5 strength of dorsiflexion; 4/5 strength of hip flexors, significant pain elicited when laying patient back and during straight leg raise bilaterally to the point of tears Derm: No appreciable rash on face  ASSESSMENT/PLAN:  Chronic bilateral low back pain with bilateral sciatica Assessment & Plan: Chronic lower back pain with bilateral lower extremity paresthesia which  is persistent and requiring use of cane.  Reviewed prior imaging and most recent MR showed no changes from prior imaging in 2022.  She was also recently seen for knee pain and did not have any imaging done.  She cannot clearly state which pain medication she is taking and given poor kidney function I have not prescribed any further NSAIDs nor adjusted gabapentin dosing.  Limited concern for saddle paresthesia at this time.  She would benefit greatly from orthopedic surgery referral as she has not been seen for this prior and prednisone taper in the meantime given poor utility and benefit of other pain medications.  I will have her return within the next month to see PCP for medication reconciliation only.  Orders: -     predniSONE; Take 2 tablets (40 mg total) by mouth daily with breakfast for 5 days.  Dispense: 10 tablet; Refill: 0 -     Ambulatory referral to Orthopedic Surgery  Rash: Concern for itching of face after exposure to new shampoo.  Not appreciated on physical exam.  Prednisone taper should help Return in 4 weeks (on 06/29/2022) for medication management w/ Dr. Adah Salvage. [name], DO 06/01/2022, 2:35 PM PGY-2, [place]

## 2022-06-01 NOTE — Patient Instructions (Signed)
It was great to see you today! Thank you for choosing [facility] for your primary care. Kristin [name] was seen for lower back pain.  Today we addressed: Low back pain: I prescribed you steroids to get some of the inflammation under control and placed an urgent referral to orthopedic surgery for you to be evaluated. Keep in mind the maximum amount of Tylenol you should take is 1 g every 8 hours and max amount of ibuprofen is 600 mg every 8 hours.  I would not advise you take any more Goody powder or Aleve.  If you haven't already, sign up for My Chart to have easy access to your labs results, and communication with your primary care physician.  I recommend that you always bring your medications to each appointment as this makes it easy to ensure you are on the correct medications and helps [place] not miss refills when you need them. Call the clinic at [phone] if your symptoms worsen or you have any concerns.  You should return to our clinic Return if symptoms worsen or fail to improve. Please arrive 15 minutes before your appointment to ensure smooth check in process.  We appreciate your efforts in making this happen.  Thank you for allowing me to participate in your care, [name], DO 06/01/2022, 2:20 PM PGY-2, [place]

## 2022-06-05 ENCOUNTER — Ambulatory Visit: Payer: Medicaid Other | Admitting: Orthopaedic Surgery

## 2022-06-12 ENCOUNTER — Ambulatory Visit: Payer: Medicaid Other | Admitting: Student

## 2022-06-12 NOTE — Progress Notes (Deleted)
  SUBJECTIVE:   CHIEF COMPLAINT / HPI:   ***  PERTINENT  PMH / PSH: ***  Past Medical History:  Diagnosis Date   Acid reflux    ACL tear 2011   not sure which side   Acute lateral meniscus tear of right knee    Anxiety    Arthritis    Back pain    slipped disc lower back lumbar   Bipolar 1 disorder ([place])    Depression    Dysfunctional uterine bleeding    Eczema    Hypertension    Insomnia    Migraine headache    Neuromuscular disorder ([place])    leg cramps   Respiratory distress 01/12/2022    Patient Care Team: Alen Bleacher, MD as PCP - General (Family Medicine) OBJECTIVE:  There were no vitals taken for this visit. Physical Exam   ASSESSMENT/PLAN:  There are no diagnoses linked to this encounter. No follow-ups on file. Kristin Guiles, DO 06/12/2022, 1:01 PM PGY-***, [facility] {    This will disappear when note is signed, click to select method of visit    :1}

## 2022-06-14 ENCOUNTER — Ambulatory Visit (INDEPENDENT_AMBULATORY_CARE_PROVIDER_SITE_OTHER): Payer: Medicaid Other | Admitting: Student

## 2022-06-14 VITALS — BP 126/80 | HR 77 | Wt 173.0 lb

## 2022-06-14 DIAGNOSIS — Z111 Encounter for screening for respiratory tuberculosis: Secondary | ICD-10-CM | POA: Diagnosis not present

## 2022-06-14 DIAGNOSIS — M170 Bilateral primary osteoarthritis of knee: Secondary | ICD-10-CM

## 2022-06-14 NOTE — Progress Notes (Signed)
  SUBJECTIVE:   CHIEF COMPLAINT / HPI:   Back/knee pain: difficulty getting sleep because she has been having back/knee pain. States prednisone was not helpful at all.  She has not made any improvement with the orthopedic surgery referral.  She is requesting injections of her back or knees today.  PERTINENT  PMH / PSH: Bipolar disorder, obesity, HFrEF, HTN, GERD  Patient Care Team: Alen Bleacher, MD as PCP - General (Family Medicine) OBJECTIVE:  BP 126/80   Pulse 77   Wt 173 lb (78.5 kg)   SpO2 99%   BMI 29.24 kg/m  General: Well-appearing, NAD Knee, bilateral: TTP noted at the joint line and condyle tenderness with swelling concerning for effusion bilaterally. Inspection was negative for erythema, ecchymosis. No obvious bony abnormalities or signs of osteophyte development. Palpation yielded no asymmetric warmth; No patellar tenderness; No patellar crepitus. Patellar and quadriceps tendons unremarkable. No obvious Baker's cyst development. FROM in flexion (135 degrees) and extension (0 degrees).  Reduced (4/5) hamstring and quadriceps strength. Neurovascularly intact bilaterally.  - Cruciate Ligaments:   - Anterior Drawer:  NEG - Posterior Drawer: NEG  - Collateral Ligaments:   - Varus/Valgus Stress test: NEG  - Meniscus:   - McMurray's: NEG   - Apley's: NEG  ASSESSMENT/PLAN:  Primary osteoarthritis of both knees Assessment & Plan: Deferred knee injection at this time.  I will obtain updated knee x-rays since this has not been completed since 2021.  Patient has not been seen by sports medicine nor orthopedic surgery as advised.  I have provided orthopedic surgery number for her to call to get scheduled.  I suspect her ongoing knee pain is related to osteoarthritis and should she have effusion is likely also correlated with this.  I do not have reason to believe she would have bilateral septic joints and does not have infectious presentation.  Discussed that ultimately she may need  knee replacements.  Orders: -     DG Knee Complete 4 Views Left; Future -     DG Knee Complete 4 Views Right; Future  Encounter for PPD test -     TB Skin Test  Return in about 2 weeks (around 06/28/2022) for Med rec with Dr. Adah Salvage. [name], DO 06/14/2022, 12:04 PM PGY-2, [place]

## 2022-06-14 NOTE — Assessment & Plan Note (Addendum)
Deferred knee injection at this time.  I will obtain updated knee x-rays since this has not been completed since 2021.  Patient has not been seen by sports medicine nor orthopedic surgery as advised.  I have provided orthopedic surgery number for her to call to get scheduled.  I suspect her ongoing knee pain is related to osteoarthritis and should she have effusion is likely also correlated with this.  I do not have reason to believe she would have bilateral septic joints and does not have infectious presentation.  Discussed that ultimately she may need knee replacements.

## 2022-06-14 NOTE — Patient Instructions (Addendum)
It was great to see you today! Thank you for choosing [facility] for your primary care. Kristin [name] was seen for knee pain.  Today we addressed: Please contact orthopedic surgery, their information is below.  I have ordered knee x-rays as I suspect a lot of this is related to osteoarthritis.  [facility] Address: [address] [place], [place] [address] Phone: [phone]  I have placed an order for knee x-rays.  Please go to [place] [facility] [employer] [facility] [employer] or at [facility] to have this completed.  You do not need an appointment, but if you would like to call them beforehand, their number is [phone].  We will contact you with your results afterwards.   If you haven't already, sign up for My Chart to have easy access to your labs results, and communication with your primary care physician.  You should return to our clinic Return in about 2 weeks (around 06/28/2022) for Med rec with Dr. Adah Salvage. Please arrive 15 minutes before your appointment to ensure smooth check in process.  We appreciate your efforts in making this happen.  Thank you for allowing me to participate in your care, [name], DO 06/14/2022, 11:26 AM PGY-2, [place]

## 2022-06-15 ENCOUNTER — Ambulatory Visit
Admission: RE | Admit: 2022-06-15 | Discharge: 2022-06-15 | Disposition: A | Discharge status: Home / Self Care | Payer: Medicaid Other | Source: Ambulatory Visit | Attending: Family Medicine | Admitting: Family Medicine

## 2022-06-15 DIAGNOSIS — M170 Bilateral primary osteoarthritis of knee: Secondary | ICD-10-CM

## 2022-06-16 ENCOUNTER — Ambulatory Visit (INDEPENDENT_AMBULATORY_CARE_PROVIDER_SITE_OTHER): Payer: Medicaid Other

## 2022-06-16 DIAGNOSIS — Z111 Encounter for screening for respiratory tuberculosis: Secondary | ICD-10-CM

## 2022-06-16 LAB — TB SKIN TEST
Induration: 0 mm
TB Skin Test: NEGATIVE

## 2022-06-19 NOTE — Progress Notes (Signed)
Patient is here for a PPD read.  It was placed on 06/14/2022 in the left forearm @ 1130 am.    PPD RESULTS:  Result: negative Induration: 0 mm  Letter created and given to patient for documentation purposes. Veronda Prude, RN

## 2022-06-26 ENCOUNTER — Ambulatory Visit: Payer: Medicaid Other | Admitting: Student

## 2022-06-27 ENCOUNTER — Ambulatory Visit (INDEPENDENT_AMBULATORY_CARE_PROVIDER_SITE_OTHER): Payer: Medicaid Other | Admitting: Family Medicine

## 2022-06-27 ENCOUNTER — Ambulatory Visit: Payer: Medicaid Other | Admitting: Family Medicine

## 2022-06-27 ENCOUNTER — Encounter: Payer: Self-pay | Admitting: Family Medicine

## 2022-06-27 VITALS — BP 144/86 | HR 86 | Wt 178.8 lb

## 2022-06-27 DIAGNOSIS — D649 Anemia, unspecified: Secondary | ICD-10-CM | POA: Diagnosis not present

## 2022-06-27 DIAGNOSIS — M545 Low back pain, unspecified: Secondary | ICD-10-CM | POA: Diagnosis not present

## 2022-06-27 DIAGNOSIS — M170 Bilateral primary osteoarthritis of knee: Secondary | ICD-10-CM

## 2022-06-27 DIAGNOSIS — G4700 Insomnia, unspecified: Secondary | ICD-10-CM | POA: Diagnosis not present

## 2022-06-27 DIAGNOSIS — M25562 Pain in left knee: Secondary | ICD-10-CM

## 2022-06-27 DIAGNOSIS — F319 Bipolar disorder, unspecified: Secondary | ICD-10-CM

## 2022-06-27 DIAGNOSIS — M25561 Pain in right knee: Secondary | ICD-10-CM

## 2022-06-27 DIAGNOSIS — G8929 Other chronic pain: Secondary | ICD-10-CM | POA: Diagnosis not present

## 2022-06-27 DIAGNOSIS — G629 Polyneuropathy, unspecified: Secondary | ICD-10-CM

## 2022-06-27 MED ORDER — HYDROXYZINE HCL 50 MG PO TABS
50.0000 mg | ORAL_TABLET | Freq: Every evening | ORAL | 2 refills | Status: DC | PRN
Start: 2022-06-27 — End: 2023-04-17

## 2022-06-27 NOTE — Progress Notes (Unsigned)
SUBJECTIVE:   CHIEF COMPLAINT / HPI:   Kristin [name] is a 47 year old woman here today with multiple issues. See below.   Knee x-rays Would like to discuss results of knee x-rays taken earlier this month. Showed images of x-rays and discussed the meaning of tri-compartment degenerative changes.  Discussed referral to ortho.   Chronic pain - knee and back Has tried Tylenol arthritis, ibuprofen, advil, goody powder, and topical creams - no relief Has previously tried muscle relaxers - no relief Has tried PT, but it made pain worse Patient expresses some frustration about not being prescribed pain medication, as this pain significantly affects her quality of life Never before tried water based therapy  Abnormal feeling in feet Bilateral numbness and tingling in feet Chronic, insidious onset No known diabetes, but no A1c in several years Used to drink heavily but not now No restrictive diet such as vegan  Insomnia Difficulty falling sleep and staying asleep Does not nap during the day Has tried OTC "anything that says PM" without much relief Also tried melatonin - didn't work   PERTINENT  PMH / PSH:  Patient Active Problem List   Diagnosis Date Noted   Acute HFrEF (heart failure with reduced ejection fraction) 01/06/2022    Priority: High   Chronic anemia     Priority: Medium    Substance abuse 01/06/2022    Priority: Low   Neuropathy 06/29/2022   Physical deconditioning 01/10/2022   Stress-induced cardiomyopathy    Acute congestive heart failure    Acute respiratory failure 01/06/2022   Spinal stenosis of lumbar region with neurogenic claudication 11/25/2021   Generalized abdominal pain 10/26/2021   History of recurrent UTIs 04/19/2021   Primary osteoarthritis of both knees 12/02/2020   Chronic bilateral low back pain without sciatica 12/02/2020   Gastroesophageal reflux disease without esophagitis 12/02/2020   Functional diarrhea    Migraine headache    Recurrent  UTI 08/06/2019   Esophagitis determined by endoscopy    Generalized anxiety disorder 08/23/2015   Allergic rhinitis 06/12/2012   Eczema 03/26/2012   Alcohol use disorder, mild, abuse 10/26/2010   Bipolar 1 disorder 10/09/2010   Panic attacks 10/09/2010   Insomnia 06/29/2009   TOBACCO USER 01/23/2009   Obesity 10/29/2008   Essential hypertension 12/20/2006    OBJECTIVE:   BP (!) 144/86   Pulse 86   Wt 178 lb 12.8 oz (81.1 kg)   SpO2 99%   BMI 30.22 kg/m    PHQ-9:     06/27/2022    1:57 PM 06/14/2022   11:09 AM 06/01/2022    1:51 PM  Depression screen PHQ 2/9  Decreased Interest 2 2 2   Down, Depressed, Hopeless 1 1 2   PHQ - 2 Score 3 3 4   Altered sleeping 0 3 3  Tired, decreased energy 0 2 3  Change in appetite 1 2 2   Feeling bad or failure about yourself  0 0 0  Trouble concentrating 2 3 2   Moving slowly or fidgety/restless 3 3 3   Suicidal thoughts 0 0 0  PHQ-9 Score 9 16 17   Difficult doing work/chores Extremely dIfficult      GAD-7:     12/01/2020    3:25 PM 06/14/2017    5:15 PM 03/16/2017   11:27 AM 01/10/2017    2:10 PM  GAD 7 : Generalized Anxiety Score  Nervous, Anxious, on Edge 1 3 1 3   Control/stop worrying 2 2 1 3   Worry too much - different  things Trouble relaxing Restless 0 3 0 3  Easily annoyed or irritable Afraid - awful might happen 0 2 0 2  Total GAD 7 Score Anxiety Difficulty  Somewhat difficult     Physical Exam General: Awake, alert, oriented, no acute distress Respiratory: Unlabored respirations, speaking in full sentences, no respiratory distress Extremities: Moving all extremities spontaneously Neuro: Cranial nerves II through X grossly intact  ASSESSMENT/PLAN:   Primary osteoarthritis of both knees Chronic. Discussed findings on bilateral knee XR. Given new complaint of bilateral neuropathy, will defer injections until we confirm absence of diabetes. Patient interested in knee injections in the  future.  - A1c today, once back and normal, we can schedule [facility] appt for steroid injection of knee - provided name and number of ortho office previously referred to - referred to PT for water based therapy for knees and back  Chronic bilateral low back pain without sciatica Patient indicating she would like pain medication. Has not found relief in any OTC or non-opioid prescription thus far. Discussed need for PT, although she reports prior pain aggravation with previous PT sessions. Re-refer to PT specifically for water based therapy. Decline opioid medications at this time, will attempt to avoid starting these if at all possible.   Chronic anemia Will check CBC today with other blood work.   Bipolar 1 disorder (HCC) Patient does not recall having atarax for sleep trial. Not yet established with psych despite referral. I will reach out to our referral coordinator to determine status. May need to refer to care coordination for assistance establishing. Wonder if bipolar diagnosis is root cause of current insomnia complaint.   Insomnia Decline to restart ambien, discussed with patient that our practice typically does not provide this medication due to side effects. Trial hydroxyzine as previously discussed, patient did not recall this from prior appt. Provided sleep hygiene tips, advised this would be an extensive slow work up with multiple check ins. Decline sleep study at this time, but could consider in the future.   Neuropathy New complaint of BLE numbness and tingling, although chronic with insidious onset per patient. Will r/o common causes with A1c, B12, folate. If these are normal, could consider alcohol-related nerve damage from previous heavy drinking. Will follow results.     Fayette Pho, MD [facility]

## 2022-06-27 NOTE — Patient Instructions (Addendum)
It was wonderful to see you today. Thank you for allowing me to be a part of your care. Below is a short summary of what we discussed at your visit today:  Knee arthritis and pain Once the lab results are back, please call to make an appointment here for knee injections. We can do steroid and lidocaine injections here.   Please call the orthopedic office below to schedule a consultation.    [facility] Address: [address], [place], [place] [address] Phone: [phone]  For knee and back pain, I highly recommend water therapy.  I have referred you to PT for water therapy.  Someone from their office should be calling you in 1 to 2 weeks to schedule an appointment.  If you do not hear from them, let us know. We may need to nudge along the referral.    Sleep Try atarax 50-100 mg at bedtime.  Come back in 2-3 weeks for follow up with your PCP to see how this works for you.  We can do a sleep study after the next appointment - talk with PCP.   Nerve pain Labs today. If the results are normal, I will send you a letter or MyChart message. If the results are abnormal, I will give you a call.     Please bring all of your medications to every appointment!  If you have any questions or concerns, please do not hesitate to contact us via phone or MyChart message.   Fayette Pho, MD

## 2022-06-28 LAB — CBC
Hematocrit: 37.5 % (ref 34.0–46.6)
Hemoglobin: 12 g/dL (ref 11.1–15.9)
MCH: 30.1 pg (ref 26.6–33.0)
MCHC: 32 g/dL (ref 31.5–35.7)
MCV: 94 fL (ref 79–97)
Platelets: 179 10*3/uL (ref 150–450)
RBC: 3.99 x10E6/uL (ref 3.77–5.28)
RDW: 13.2 % (ref 11.7–15.4)
WBC: 3.9 10*3/uL (ref 3.4–10.8)

## 2022-06-28 LAB — FOLATE: Folate: 9.6 ng/mL (ref >3.0)

## 2022-06-28 LAB — HEMOGLOBIN A1C
Est. average glucose Bld gHb Est-mCnc: 91 mg/dL
Hgb A1c MFr Bld: 4.8 % (ref 4.8–5.6)

## 2022-06-28 LAB — VITAMIN B12: Vitamin B-12: 282 pg/mL (ref 232–1245)

## 2022-06-29 ENCOUNTER — Ambulatory Visit (INDEPENDENT_AMBULATORY_CARE_PROVIDER_SITE_OTHER): Payer: Medicaid Other | Admitting: Student in an Organized Health Care Education/Training Program

## 2022-06-29 ENCOUNTER — Encounter: Payer: Self-pay | Admitting: Family Medicine

## 2022-06-29 VITALS — BP 153/107 | HR 95 | Ht 64.5 in | Wt 182.2 lb

## 2022-06-29 DIAGNOSIS — F319 Bipolar disorder, unspecified: Secondary | ICD-10-CM

## 2022-06-29 DIAGNOSIS — G629 Polyneuropathy, unspecified: Secondary | ICD-10-CM | POA: Insufficient documentation

## 2022-06-29 DIAGNOSIS — F4310 Post-traumatic stress disorder, unspecified: Secondary | ICD-10-CM | POA: Diagnosis not present

## 2022-06-29 DIAGNOSIS — F411 Generalized anxiety disorder: Secondary | ICD-10-CM

## 2022-06-29 NOTE — Assessment & Plan Note (Signed)
Patient does not recall having atarax for sleep trial. Not yet established with psych despite referral. I will reach out to our referral coordinator to determine status. May need to refer to care coordination for assistance establishing. Wonder if bipolar diagnosis is root cause of current insomnia complaint.

## 2022-06-29 NOTE — Assessment & Plan Note (Signed)
Decline to restart ambien, discussed with patient that our practice typically does not provide this medication due to side effects. Trial hydroxyzine as previously discussed, patient did not recall this from prior appt. Provided sleep hygiene tips, advised this would be an extensive slow work up with multiple check ins. Decline sleep study at this time, but could consider in the future.

## 2022-06-29 NOTE — Assessment & Plan Note (Signed)
New complaint of BLE numbness and tingling, although chronic with insidious onset per patient. Will r/o common causes with A1c, B12, folate. If these are normal, could consider alcohol-related nerve damage from previous heavy drinking. Will follow results.

## 2022-06-29 NOTE — Assessment & Plan Note (Signed)
Patient indicating she would like pain medication. Has not found relief in any OTC or non-opioid prescription thus far. Discussed need for PT, although she reports prior pain aggravation with previous PT sessions. Re-refer to PT specifically for water based therapy. Decline opioid medications at this time, will attempt to avoid starting these if at all possible.
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
## 2022-06-29 NOTE — Assessment & Plan Note (Signed)
Will check CBC today with other blood work.

## 2022-06-29 NOTE — Assessment & Plan Note (Signed)
Chronic. Discussed findings on bilateral knee XR. Given new complaint of bilateral neuropathy, will defer injections until we confirm absence of diabetes. Patient interested in knee injections in the future.  - A1c today, once back and normal, we can schedule [facility] appt for steroid injection of knee - provided name and number of ortho office previously referred to - referred to PT for water based therapy for knees and back

## 2022-06-30 ENCOUNTER — Encounter (HOSPITAL_COMMUNITY): Payer: Self-pay | Admitting: Student in an Organized Health Care Education/Training Program

## 2022-07-02 ENCOUNTER — Encounter: Payer: Self-pay | Admitting: Family Medicine

## 2022-07-20 ENCOUNTER — Ambulatory Visit: Payer: Medicaid Other | Attending: Family Medicine

## 2022-07-20 DIAGNOSIS — M25561 Pain in right knee: Secondary | ICD-10-CM | POA: Insufficient documentation

## 2022-07-20 DIAGNOSIS — M25562 Pain in left knee: Secondary | ICD-10-CM | POA: Insufficient documentation

## 2022-07-20 DIAGNOSIS — M5416 Radiculopathy, lumbar region: Secondary | ICD-10-CM

## 2022-07-20 DIAGNOSIS — G8929 Other chronic pain: Secondary | ICD-10-CM

## 2022-07-20 NOTE — Therapy (Signed)
OUTPATIENT PHYSICAL THERAPY THORACOLUMBAR EVALUATION   Patient Name: Kristin [name] MRN: [id number] DOB:19-Sep-1975, 47 y.o., female Today's Date: 07/20/2022  END OF SESSION:  PT End of Session - 07/20/22 1629     Visit Number 1    PT Start Time 1600             Past Medical History:  Diagnosis Date   Acid reflux    ACL tear 2011   not sure which side   Acute lateral meniscus tear of right knee    Anxiety    Arthritis    Back pain    slipped disc lower back lumbar   Bipolar 1 disorder (HCC)    Depression    Dysfunctional uterine bleeding    Eczema    Hypertension    Insomnia    Migraine headache    Neuromuscular disorder (HCC)    leg cramps   Osteoarthritis of right knee 12/21/2014   ACL deficit since 2011   Respiratory distress 01/12/2022   Sepsis with acute hypoxic respiratory failure without septic shock, due to unspecified organism (HCC) 01/06/2022   Past Surgical History:  Procedure Laterality Date   BIOPSY  09/19/2017   Procedure: BIOPSY;  Surgeon: Kathi Der, MD;  [facility];  Service: Gastroenterology;;   BIOPSY  10/15/2019   Procedure: BIOPSY;  Surgeon: Charlott Rakes, MD;  [facility]: [facility];  Service: Endoscopy;;   COLONOSCOPY N/A 10/15/2019   Procedure: COLONOSCOPY;  Surgeon: Charlott Rakes, MD;  [facility];  Service: Endoscopy;  Laterality: N/A;   DILITATION & CURRETTAGE/HYSTROSCOPY WITH HYDROTHERMAL ABLATION N/A 04/04/2017   Procedure: DILATATION & CURETTAGE/HYSTEROSCOPY WITH HYDROTHERMAL ABLATION;  Surgeon: Reva Bores, MD;  Location: [facility];  Service: Gynecology;  Laterality: N/A;   ESOPHAGOGASTRODUODENOSCOPY N/A 10/15/2019   Procedure: ESOPHAGOGASTRODUODENOSCOPY (EGD);  Surgeon: Charlott Rakes, MD;  Location: [facility];  Service: Endoscopy;  Laterality: N/A;   ESOPHAGOGASTRODUODENOSCOPY (EGD) WITH PROPOFOL N/A 09/19/2017   Procedure: ESOPHAGOGASTRODUODENOSCOPY (EGD) WITH PROPOFOL;   Surgeon: Kathi Der, MD;  [facility];  Service: Gastroenterology;  Laterality: N/A;   KNEE ARTHROSCOPY WITH LATERAL MENISECTOMY Right 09/30/2019   Procedure: KNEE ARTHROSCOPY WITH LATERAL MENISECTOMY;  Surgeon: Sheral Apley, MD;  [facility]: [facility];  Service: Orthopedics;  Laterality: Right;   RIGHT/LEFT HEART CATH AND CORONARY ANGIOGRAPHY N/A 01/09/2022   Procedure: RIGHT/LEFT HEART CATH AND CORONARY ANGIOGRAPHY;  Surgeon: Elder Negus, MD;  [facility];  Service: Cardiovascular;  Laterality: N/A;   TUBAL LIGATION  yrs ago   Patient Active Problem List   Diagnosis Date Noted   Neuropathy 06/29/2022   Physical deconditioning 01/10/2022   Stress-induced cardiomyopathy    Acute congestive heart failure (HCC)    Acute HFrEF (heart failure with reduced ejection fraction) (HCC) 01/06/2022   Substance abuse (HCC) 01/06/2022   Acute respiratory failure (HCC) 01/06/2022   Spinal stenosis of lumbar region with neurogenic claudication 11/25/2021   Generalized abdominal pain 10/26/2021   History of recurrent UTIs 04/19/2021   Primary osteoarthritis of both knees 12/02/2020   Chronic bilateral low back pain without sciatica 12/02/2020   Gastroesophageal reflux disease without esophagitis 12/02/2020   Functional diarrhea    Migraine headache    Chronic anemia    Recurrent UTI 08/06/2019   Esophagitis determined by endoscopy    Generalized anxiety disorder 08/23/2015   Allergic rhinitis 06/12/2012   Eczema 03/26/2012   Alcohol use disorder, mild, abuse 10/26/2010   Bipolar 1 disorder (  HCC) 10/09/2010   Panic attacks 10/09/2010   Insomnia 06/29/2009   TOBACCO USER 01/23/2009   Obesity 10/29/2008   Essential hypertension 12/20/2006   PCP: Jerre Simon, MD  REFERRING PROVIDER: Billey Co, MD  REFERRING DIAG: Chronic pain of both knees [M25.561, M25.562, G89.29], Chronic bilateral low back pain without sciatica [M54.50,  G89.29]   Rationale for Evaluation and Treatment: Rehabilitation  THERAPY DIAG:  Radiculopathy, lumbar region  Chronic pain of both knees  ONSET DATE: over 2 years   SUBJECTIVE:                                                                                                                                                                                           SUBJECTIVE STATEMENT: Patient reports to PT with chronic knee pain and lumbar pain with radicular symptoms that has been present for years. She states that her knee pain is moderate-to-severe. She has numbness distal from knees to feet, bilaterally and states that "it's so numb that you could step on my toes and I would not feel it."   Patient is expressing that her expectation was that she was coming to a facility that could give her an injection or some type of medications. She states "I tried this before and it did not work." She states that she was unable to complete PT, after 2 total visits, due to her insurance at the time, which was in 2022.  Ms. Errickson stated "If you can't give me medicine here, then this is pointless. This is not going to work."    PERTINENT HISTORY:  PMHx includes ACL tear, anxiety, depression, HTN, OA, Sepsis, Neuropathy, Lumbar Stenosis with neurogenic claudication, CHF, anemia, and bipolar disorder.   PAIN: Yes, Patient reporting severe pain in B knees that ranges from 8-"way more than 10"/10 on NPRS scale provided from 0 (no pain) - 10 (severe), along with numbness in B LE  PRECAUTIONS: None  WEIGHT BEARING RESTRICTIONS: No   PATIENT GOALS: see subjective    OBJECTIVE:   DIAGNOSTIC FINDINGS:  06/18/22 DG Knee Complete 4 views Right  IMPRESSION: Similar moderate tricompartmental degenerative change, most pronounced medially.  07-11-2022 DG Knee Complete 4 views Left  IMPRESSION: Mild tricompartmental degenerative changes, most pronounced medially.  04-10-2022 MR Lumbar Spine W WO  CONTRAST  IMPRESSION: Stable compared to 05/08/2020   L4-5 degeneration with left paracentral to foraminal herniation that contacts the left L5 and L4 nerve roots without compression.  ASSESSMENT:  CLINICAL IMPRESSION: Patient is a 47 y.o. female who was seen today for physical therapy evaluation and treatment for bilateral knee pain and low back pain with  radicular symptoms consistent with lumbar stenosis with neurogenic claudication and bilateral knee OA. Despite possible benefits of skilled Physical Therapy, patient choose not to complete today's evaluation and would not like to establish PT POC. She was previously seen for our office for 2 total visits from December 2021-January 2022 and feels that PT will not work for her now.   Patient will return to referring provider's care at this time and will require additional referral for PT services, should they be required in the future.     GOALS:  None established. Patient would not like to continue with skilled PT.   PLAN:  Patient will not be seen by skilled PT services at this time and will return to care of referring provider per patient request.   Mauri Reading, PT, DPT 07/20/2022, 4:31 PM

## 2022-08-01 ENCOUNTER — Ambulatory Visit: Payer: Medicaid Other | Admitting: Family Medicine

## 2022-08-01 ENCOUNTER — Ambulatory Visit (INDEPENDENT_AMBULATORY_CARE_PROVIDER_SITE_OTHER): Payer: Medicaid Other | Admitting: Student

## 2022-08-01 ENCOUNTER — Encounter: Payer: Self-pay | Admitting: Student

## 2022-08-01 VITALS — BP 93/69 | HR 110 | Ht 64.5 in | Wt 170.0 lb

## 2022-08-01 DIAGNOSIS — M5441 Lumbago with sciatica, right side: Secondary | ICD-10-CM

## 2022-08-01 DIAGNOSIS — G8929 Other chronic pain: Secondary | ICD-10-CM

## 2022-08-01 DIAGNOSIS — R21 Rash and other nonspecific skin eruption: Secondary | ICD-10-CM | POA: Diagnosis present

## 2022-08-01 DIAGNOSIS — M170 Bilateral primary osteoarthritis of knee: Secondary | ICD-10-CM | POA: Diagnosis not present

## 2022-08-01 DIAGNOSIS — M5442 Lumbago with sciatica, left side: Secondary | ICD-10-CM | POA: Diagnosis not present

## 2022-08-01 MED ORDER — GABAPENTIN 300 MG PO CAPS
300.0000 mg | ORAL_CAPSULE | Freq: Three times a day (TID) | ORAL | 0 refills | Status: DC
Start: 2022-08-01 — End: 2022-08-21

## 2022-08-01 MED ORDER — TRIAMCINOLONE ACETONIDE 0.1 % EX CREA
1.0000 | TOPICAL_CREAM | Freq: Two times a day (BID) | CUTANEOUS | 0 refills | Status: DC
Start: 2022-08-01 — End: 2023-06-07

## 2022-08-01 NOTE — Patient Instructions (Addendum)
It was great to see you today! Thank you for choosing [facility] for your primary care. Kristin [name] was seen for rash and knees.  Today we addressed: Rash: Try the triamcinolone cream on the rash.  If it does not resolve, you may need to get the hair extensions removed. For your knees, please contact orthopedic surgery.  The referral is in place.  [facility] Address: [address] [place], [place] [address] Phone: [phone]  If you haven't already, sign up for My Chart to have easy access to your labs results, and communication with your primary care physician.  Call the clinic at [phone] if your symptoms worsen or you have any concerns.  You should return to our clinic Return if symptoms worsen or fail to improve. Please arrive 15 minutes before your appointment to ensure smooth check in process.  We appreciate your efforts in making this happen.  Thank you for allowing me to participate in your care, Shelby Mattocks, DO 08/01/2022, 2:37 PM PGY-2, [facility]

## 2022-08-01 NOTE — Progress Notes (Signed)
  SUBJECTIVE:   CHIEF COMPLAINT / HPI:   RASH  Had rash for the last week after gettign her hair done.  Location: posterior neck Medications tried: benadryl oral and cream Patient believes may be caused by her new hair.  New medications or antibiotics: no Tick, Insect or new pet exposure: no Recent travel: no New detergent or soap: no  Symptoms Itching: yes Pain over rash: no Feeling ill all over: no Fever: no Mouth sores: no Face or tongue swelling: no Trouble breathing: no  She would also like to discuss her knee osteoarthritis.  It is unchanged.  She is not interested in physical therapy.  Requesting gabapentin refill today as well.  PERTINENT  PMH / PSH: Bipolar disorder, obesity, HFrEF, HTN, GERD, bilateral knee OA  Patient Care Team: Jerre Simon, MD as PCP - General (Family Medicine) OBJECTIVE:  BP 93/69   Pulse (!) 110   Ht 5' 4.5" (1.638 m)   Wt 170 lb (77.1 kg)   SpO2 99%   BMI 28.73 kg/m  General: Well-appearing, NAD Skin: Scattered hypopigmented scabbed lesions with excoriations across the posterior neck and running upwards towards scalp, unable to appreciate scalp with hair extensions in place    ASSESSMENT/PLAN:  Rash and nonspecific skin eruption Assessment & Plan: Suspect contact dermatitis secondary to hair extensions.  Advised triamcinolone cream.  Should this not resolve, she may need to get hair extensions completely removed.  Orders: -     Triamcinolone Acetonide; Apply 1 Application topically 2 (two) times daily. DO NOT USE ON FACE  Dispense: 30 g; Refill: 0  Primary osteoarthritis of both knees Assessment & Plan: Chronic.  Again discussed treatment plan.  She is not interested in physical therapy, I have reviewed PT note.  I have again provided name and number of orthopedic surgery office for her to contact for appointment.  I have discussed with her that they may opt to try injections prior to surgery.   Chronic bilateral low back  pain with bilateral sciatica -     Gabapentin; Take 1 capsule (300 mg total) by mouth 3 (three) times daily. 1 tablet in the morning and 2 at night before bed.  Dispense: 90 capsule; Refill: 0  Return if symptoms worsen or fail to improve. Shelby Mattocks, DO 08/01/2022, 5:19 PM PGY-2, [facility]

## 2022-08-01 NOTE — Assessment & Plan Note (Addendum)
Chronic.  Again discussed treatment plan.  She is not interested in physical therapy, I have reviewed PT note.  I have again provided name and number of orthopedic surgery office for her to contact for appointment.  I have discussed with her that they may opt to try injections prior to surgery.

## 2022-08-01 NOTE — Assessment & Plan Note (Signed)
Suspect contact dermatitis secondary to hair extensions.  Advised triamcinolone cream.  Should this not resolve, she may need to get hair extensions completely removed.

## 2022-08-11 ENCOUNTER — Encounter (HOSPITAL_COMMUNITY): Payer: Medicaid Other | Admitting: Student in an Organized Health Care Education/Training Program

## 2022-08-11 NOTE — Progress Notes (Deleted)
BH MD/PA/NP OP Progress Note  08/11/2022 6:52 AM Kristin [name]  MRN:  [id number]  Chief Complaint: No chief complaint on file.  HPI:  Kristin [name] is a 47 yr old female who presents for Follow Up and Medication Management.  PPHx is significant for Bipolar Disorder, GAD, EtOH Abuse, Cocaine Abuse, and PTSD, and 1 Suicide Attempt (OD- 1990's), and no history of Self Injurious Behavior or Psychiatric Hospitalizations.    ***   Visit Diagnosis: No diagnosis found.  Past Psychiatric History: Bipolar Disorder, GAD, EtOH Abuse, Cocaine Abuse, and PTSD, and 1 Suicide Attempt (OD- 1990's), and no history of Self Injurious Behavior or Psychiatric Hospitalizations.   Past Medical History:  Past Medical History:  Diagnosis Date   Acid reflux    ACL tear 2011   not sure which side   Acute lateral meniscus tear of right knee    Anxiety    Arthritis    Back pain    slipped disc lower back lumbar   Bipolar 1 disorder (HCC)    Depression    Dysfunctional uterine bleeding    Eczema    Hypertension    Insomnia    Migraine headache    Neuromuscular disorder (HCC)    leg cramps   Osteoarthritis of right knee 12/21/2014   ACL deficit since 2011   Respiratory distress 01/12/2022   Sepsis with acute hypoxic respiratory failure without septic shock, due to unspecified organism (HCC) 01/06/2022    Past Surgical History:  Procedure Laterality Date   BIOPSY  09/19/2017   Procedure: BIOPSY;  Surgeon: Kathi Der, MD;  [facility];  Service: Gastroenterology;;   BIOPSY  10/15/2019   Procedure: BIOPSY;  Surgeon: Charlott Rakes, MD;  [facility];  Service: Endoscopy;;   COLONOSCOPY N/A 10/15/2019   Procedure: COLONOSCOPY;  Surgeon: Charlott Rakes, MD;  [facility];  Service: Endoscopy;  Laterality: N/A;   DILITATION & CURRETTAGE/HYSTROSCOPY WITH HYDROTHERMAL ABLATION N/A 04/04/2017   Procedure: DILATATION & CURETTAGE/HYSTEROSCOPY WITH HYDROTHERMAL  ABLATION;  Surgeon: Reva Bores, MD;  Location: [facility];  Service: Gynecology;  Laterality: N/A;   ESOPHAGOGASTRODUODENOSCOPY N/A 10/15/2019   Procedure: ESOPHAGOGASTRODUODENOSCOPY (EGD);  Surgeon: Charlott Rakes, MD;  Location: [facility];  Service: Endoscopy;  Laterality: N/A;   ESOPHAGOGASTRODUODENOSCOPY (EGD) WITH PROPOFOL N/A 09/19/2017   Procedure: ESOPHAGOGASTRODUODENOSCOPY (EGD) WITH PROPOFOL;  Surgeon: Kathi Der, MD;  [facility];  Service: Gastroenterology;  Laterality: N/A;   KNEE ARTHROSCOPY WITH LATERAL MENISECTOMY Right 09/30/2019   Procedure: KNEE ARTHROSCOPY WITH LATERAL MENISECTOMY;  Surgeon: Sheral Apley, MD;  Location: [facility];  Service: Orthopedics;  Laterality: Right;   RIGHT/LEFT HEART CATH AND CORONARY ANGIOGRAPHY N/A 01/09/2022   Procedure: RIGHT/LEFT HEART CATH AND CORONARY ANGIOGRAPHY;  Surgeon: Elder Negus, MD;  [facility];  Service: Cardiovascular;  Laterality: N/A;   TUBAL LIGATION  yrs ago    Family Psychiatric History: Sister- Undiagnosed, Suicide Attempt Nephew- Schizophrenia/Bipolar Disorder Cousin- Schizophrenia Cousin- Bipolar Disorder  Family History:  Family History  Problem Relation Age of Onset   Breast cancer Maternal Grandmother    Hypertension Mother    Hypertension Brother    Colon cancer Maternal Uncle    Bipolar disorder Cousin    Schizophrenia Cousin     Social History:  Social History   Socioeconomic History   Marital status: Single    Spouse name: Not on file   Number of children: 3   Years of education:  Not on file   Highest education level: High school graduate  Occupational History   Not on file  Tobacco Use   Smoking status: Some Days    Packs/day: 0.50    Years: 5.00    Additional pack years: 0.00    Total pack years: 2.50    Types: Cigarettes    Passive exposure: Current   Smokeless tobacco: Never  Vaping Use   Vaping Use:  Never used  Substance and Sexual Activity   Alcohol use: Yes    Comment: occassionally, longer than one week ago   Drug use: No   Sexual activity: Yes    Birth control/protection: Surgical  Other Topics Concern   Not on file  Social History Narrative   Not on file   Social Determinants of Health   Financial Resource Strain: Medium Risk (12/05/2017)   Overall Financial Resource Strain (CARDIA)    Difficulty of Paying Living Expenses: Somewhat hard  Food Insecurity: Unknown (01/07/2022)   Hunger Vital Sign    Worried About Running Out of Food in the Last Year: Never true    Ran Out of Food in the Last Year: Not on file  Transportation Needs: No Transportation Needs (09/22/2020)   PRAPARE - Administrator, Civil Service (Medical): No    Lack of Transportation (Non-Medical): No  Physical Activity: Sufficiently Active (12/05/2017)   Exercise Vital Sign    Days of Exercise per Week: 5 days    Minutes of Exercise per Session: 30 min  Stress: Stress Concern Present (09/22/2020)   [employer] of Occupational Health - Occupational Stress Questionnaire    Feeling of Stress : Very much  Social Connections: Unknown (12/05/2017)   Social Connection and Isolation Panel [NHANES]    Frequency of Communication with Friends and Family: Not on file    Frequency of Social Gatherings with Friends and Family: Not on file    Attends Religious Services: More than 4 times per year    Active Member of [employer] or Organizations: Not on file    Attends Banker Meetings: Not on file    Marital Status: Divorced    Allergies:  Allergies  Allergen Reactions   Sumatriptan Anaphylaxis    High blood pressure and numb   Other     Bananas-stomach cramps    Metabolic Disorder Labs: Lab Results  Component Value Date   HGBA1C 4.8 06/27/2022   MPG 119.76 10/11/2019   No results found for: "PROLACTIN" Lab Results  Component Value Date   CHOL 170 01/06/2022   TRIG 90 01/06/2022    HDL 72 01/06/2022   CHOLHDL 2.4 01/06/2022   VLDL 18 01/06/2022   LDLCALC 80 01/06/2022   LDLCALC 148 (H) 12/01/2020   Lab Results  Component Value Date   TSH 0.242 (L) 12/01/2020   TSH 0.374 10/11/2019    Therapeutic Level Labs: No results found for: "LITHIUM" Lab Results  Component Value Date   VALPROATE 125.8 (H) 11/09/2010   No results found for: "CBMZ"  Current Medications: Current Outpatient Medications  Medication Sig Dispense Refill   acetaminophen (TYLENOL) 500 MG tablet Take 1 tablet (500 mg total) by mouth every 8 (eight) hours as needed for mild pain. 30 tablet 1   dicyclomine (BENTYL) 10 MG capsule Take by mouth.     famotidine (PEPCID) 20 MG tablet Take 1 tablet (20 mg total) by mouth 2 (two) times daily. 60 tablet 0   gabapentin (NEURONTIN) 300 MG capsule Take 1  capsule (300 mg total) by mouth 3 (three) times daily. 1 tablet in the morning and 2 at night before bed. 90 capsule 0   hydrOXYzine (ATARAX) 10 MG tablet Take 1 tablet (10 mg total) by mouth 3 (three) times daily as needed for anxiety or itching (insomnia). 60 tablet 0   hydrOXYzine (ATARAX) 50 MG tablet Take 1-2 tablets (50-100 mg total) by mouth at bedtime as needed. 30 tablet 2   lisinopril-hydrochlorothiazide (ZESTORETIC) 10-12.5 MG tablet Take 1 tablet by mouth daily.     LORazepam (ATIVAN) 0.5 MG tablet Take 0.5 mg by mouth every 8 (eight) hours.     methocarbamol (ROBAXIN) 500 MG tablet Take 1 tablet (500 mg total) by mouth 2 (two) times daily. 20 tablet 0   naltrexone (DEPADE) 50 MG tablet Take 50 mg by mouth daily. (Patient not taking: Reported on 01/06/2022)     nicotine (NICODERM CQ - DOSED IN MG/24 HOURS) 14 mg/24hr patch Place 1 patch (14 mg total) onto the skin daily. 28 patch 0   pantoprazole (PROTONIX) 40 MG tablet Take 1 tablet (40 mg total) by mouth daily. 30 tablet 0   traZODone (DESYREL) 50 MG tablet Take 1 tablet (50 mg total) by mouth at bedtime as needed for sleep. 30 tablet 1    triamcinolone cream (KENALOG) 0.1 % Apply 1 Application topically 2 (two) times daily. DO NOT USE ON FACE 30 g 0   No current facility-administered medications for this visit.     Musculoskeletal: Strength & Muscle Tone: {desc; muscle tone:32375} Gait & Station: {PE GAIT ED ZOXW:96045} Patient leans: {Patient Leans:21022755}  Psychiatric Specialty Exam: Review of Systems  There were no vitals taken for this visit.There is no height or weight on file to calculate BMI.  General Appearance: {Appearance:22683}  Eye Contact:  {BHH EYE CONTACT:22684}  Speech:  {Speech:22685}  Volume:  {Volume (PAA):22686}  Mood:  {BHH MOOD:22306}  Affect:  {Affect (PAA):22687}  Thought Process:  {Thought Process (PAA):22688}  Orientation:  {BHH ORIENTATION (PAA):22689}  Thought Content: {Thought Content:22690}   Suicidal Thoughts:  {ST/HT (PAA):22692}  Homicidal Thoughts:  {ST/HT (PAA):22692}  Memory:  {BHH MEMORY:22881}  Judgement:  {Judgement (PAA):22694}  Insight:  {Insight (PAA):22695}  Psychomotor Activity:  {Psychomotor (PAA):22696}  Concentration:  {Concentration:21399}  Recall:  {BHH GOOD/FAIR/POOR:22877}  Fund of Knowledge: {BHH GOOD/FAIR/POOR:22877}  Language: {BHH GOOD/FAIR/POOR:22877}  Akathisia:  {BHH YES OR NO:22294}  Handed:  {Handed:22697}  AIMS (if indicated): {Desc; done/not:10129}  Assets:  {Assets (PAA):22698}  ADL's:  {BHH WUJ'W:11914}  Cognition: {chl bhh cognition:304700322}  Sleep:  {BHH GOOD/FAIR/POOR:22877}   Screenings: CAGE-AID    Flowsheet Row ED to Hosp-Admission (Discharged) from 05/30/2020 in [facility]  CAGE-AID Score 1      GAD-7    Flowsheet Row Office Visit from 12/01/2020 in [facility] Office Visit from 06/14/2017 in [facility] Office Visit from 03/16/2017 in [facility] Office Visit from 01/10/2017 in [facility] Office Visit from 12/26/2016 in  [facility]  Total GAD-7 Score 10 18 9 20 20       PHQ2-9    Flowsheet Row Office Visit from 08/01/2022 in [facility] Office Visit from 06/27/2022 in [facility] Office Visit from 06/14/2022 in [facility] Office Visit from 06/01/2022 in [facility] Office Visit from 05/09/2022 in [facility]  PHQ-2 Total  Score 3 3 3 4 5   PHQ-9 Total Score 14 9 16 17 18       Flowsheet Row ED from 02/09/2022 in [facility] Emergency Department at [facility] ED to Hosp-Admission (Discharged) from 01/05/2022 in [facility] 3E HF PCU ED from 12/10/2021 in [facility] Emergency Department at [facility]  C-SSRS RISK CATEGORY No Risk No Risk No Risk        Assessment and Plan:  Kristin [name] is a 47 yr old female who presents for Follow Up and Medication Management.  PPHx is significant for Bipolar Disorder, GAD, EtOH Abuse, Cocaine Abuse, and PTSD, and 1 Suicide Attempt (OD- 1990's), and no history of Self Injurious Behavior or Psychiatric Hospitalizations.    ***   Bipolar Disorder vs. IED: -No current Mania or imminent risk so will not start medication until she gets a new EKG and CMP. -Will consider starting Zyprexa at follow up     GAD  PTSD: -Due to risk of Bipolar Disorder will not stat anti depressant at this time. -Once Zyprexa or other mood stabilizer is started will consider antidepressant      Hx of EtOH Abuse and Cocaine Abuse -Reports no current Cocaine Use -Reports minimal EtOH Abuse    Collaboration of Care: Collaboration of Care: {BH OP Collaboration of Care:21014065}  Patient/Guardian was advised Release of Information must be obtained prior to any record release in order to collaborate their care with an outside provider. Patient/Guardian was advised if they have not already done so to contact the registration  department to sign all necessary forms in order for [place] to release information regarding their care.   Consent: Patient/Guardian gives verbal consent for treatment and assignment of benefits for services provided during this visit. Patient/Guardian expressed understanding and agreed to proceed.    Lauro Franklin, MD 08/11/2022, 6:52 AM

## 2022-08-21 ENCOUNTER — Ambulatory Visit (INDEPENDENT_AMBULATORY_CARE_PROVIDER_SITE_OTHER): Payer: Medicaid Other | Admitting: Family Medicine

## 2022-08-21 ENCOUNTER — Encounter: Payer: Self-pay | Admitting: Family Medicine

## 2022-08-21 ENCOUNTER — Other Ambulatory Visit: Payer: Self-pay

## 2022-08-21 VITALS — BP 111/84 | HR 73 | Ht 64.0 in | Wt 168.2 lb

## 2022-08-21 DIAGNOSIS — M5441 Lumbago with sciatica, right side: Secondary | ICD-10-CM

## 2022-08-21 DIAGNOSIS — R300 Dysuria: Secondary | ICD-10-CM

## 2022-08-21 DIAGNOSIS — M5442 Lumbago with sciatica, left side: Secondary | ICD-10-CM | POA: Diagnosis not present

## 2022-08-21 DIAGNOSIS — G8929 Other chronic pain: Secondary | ICD-10-CM

## 2022-08-21 DIAGNOSIS — N390 Urinary tract infection, site not specified: Secondary | ICD-10-CM | POA: Diagnosis present

## 2022-08-21 LAB — POCT URINALYSIS DIP (MANUAL ENTRY)
Bilirubin, UA: NEGATIVE
Glucose, UA: NEGATIVE mg/dL
Ketones, POC UA: NEGATIVE mg/dL
Nitrite, UA: POSITIVE — AB
Protein Ur, POC: NEGATIVE mg/dL
Spec Grav, UA: 1.025 (ref 1.010–1.025)
Urobilinogen, UA: 0.2 E.U./dL
pH, UA: 5.5 (ref 5.0–8.0)

## 2022-08-21 LAB — POCT UA - MICROSCOPIC ONLY: WBC, Ur, HPF, POC: >20 (ref 0–5)

## 2022-08-21 MED ORDER — CEPHALEXIN 500 MG PO CAPS: 500.0000 mg | ORAL_CAPSULE | Freq: Two times a day (BID) | ORAL | 0 refills | Status: AC

## 2022-08-21 MED ORDER — METHOCARBAMOL 500 MG PO TABS: 500.0000 mg | ORAL_TABLET | Freq: Two times a day (BID) | ORAL | 0 refills | Status: DC

## 2022-08-21 MED ORDER — GABAPENTIN 300 MG PO CAPS: 300.0000 mg | ORAL_CAPSULE | Freq: Three times a day (TID) | ORAL | 0 refills | Status: DC

## 2022-08-21 NOTE — Patient Instructions (Addendum)
It was great seeing you today!  We checked your urine and it did show an infection. I have sent in your antibiotic Keflex to take twice a day for 10 days. If you still have symptoms after you complete it please let us know.   You can call the orthopedic office to schedule an appointment for your knees and back: [facility] Address: [address] [place], [place] [address] Phone: [phone]  I sent in for your muscle relaxer and Gabapentin    Please check-out at the front desk before leaving the clinic. I'd like to see you back in  but if you need to be seen earlier than that for any new issues we're happy to fit you in, just give [place] a call!    Feel free to call with any questions or concerns at any time, at [phone].   Take care,  Dr. Cora Collum [facility]

## 2022-08-21 NOTE — Progress Notes (Unsigned)
    SUBJECTIVE:   CHIEF COMPLAINT / HPI:   Patient presents for increased urinary frequency and cloudy urine. Feels it has been going on for about a week. Denies burning. Sometimes has hesitancy. Denies fever. Has back pain though this is chronic.   Would like number to ortho she was referred  Requests refill of muscle relaxer and Gabapentin- [facility]   PERTINENT  PMH / PSH: Reviewed   OBJECTIVE:   BP 111/84   Pulse 73   Ht 5\' 4"  (1.626 m)   Wt 168 lb 3.2 oz (76.3 kg)   SpO2 100%   BMI 28.87 kg/m    Physical exam General: well appearing, NAD Cardiovascular: RRR, no murmurs Lungs: CTAB. Normal WOB Abdomen: soft, non-distended Skin: warm, dry. No edema  ASSESSMENT/PLAN:   Recurrent UTI UA positive for Nitrites and Leukocytes. Will treat with Keflex 500mg  BID x 10 days. Will adjust if needed based on urine culture. Return precautions discussed if no improvement after completion of abx.    Back pain Refilled Robaxin and sent Gabapentin to a different pharmacy per patient request. Also provided her the number for Ortho she was referred to.   Cora Collum, DO [facility]

## 2022-08-22 NOTE — Assessment & Plan Note (Signed)
UA positive for Nitrites and Leukocytes. Will treat with Keflex 500mg  BID x 10 days. Will adjust if needed based on urine culture. Return precautions discussed if no improvement after completion of abx.

## 2022-09-18 ENCOUNTER — Other Ambulatory Visit: Payer: Self-pay

## 2022-09-18 ENCOUNTER — Emergency Department (HOSPITAL_BASED_OUTPATIENT_CLINIC_OR_DEPARTMENT_OTHER)
Admission: EM | Admit: 2022-09-18 | Discharge: 2022-09-18 | Disposition: A | Discharge status: Home / Self Care | Payer: Medicaid Other | Attending: Emergency Medicine | Admitting: Emergency Medicine

## 2022-09-18 ENCOUNTER — Encounter (HOSPITAL_BASED_OUTPATIENT_CLINIC_OR_DEPARTMENT_OTHER): Payer: Self-pay

## 2022-09-18 ENCOUNTER — Other Ambulatory Visit (HOSPITAL_BASED_OUTPATIENT_CLINIC_OR_DEPARTMENT_OTHER): Payer: Self-pay

## 2022-09-18 DIAGNOSIS — N3090 Cystitis, unspecified without hematuria: Secondary | ICD-10-CM | POA: Diagnosis not present

## 2022-09-18 DIAGNOSIS — K047 Periapical abscess without sinus: Secondary | ICD-10-CM | POA: Diagnosis not present

## 2022-09-18 DIAGNOSIS — N3000 Acute cystitis without hematuria: Secondary | ICD-10-CM | POA: Diagnosis not present

## 2022-09-18 DIAGNOSIS — K0889 Other specified disorders of teeth and supporting structures: Secondary | ICD-10-CM | POA: Diagnosis present

## 2022-09-18 LAB — URINALYSIS, ROUTINE W REFLEX MICROSCOPIC
Bilirubin Urine: NEGATIVE
Glucose, UA: NEGATIVE mg/dL
Hgb urine dipstick: NEGATIVE
Ketones, ur: NEGATIVE mg/dL
Nitrite: POSITIVE — AB
Specific Gravity, Urine: 1.019 (ref 1.005–1.030)
WBC, UA: >50 WBC/hpf (ref 0–5)
pH: 5.5 (ref 5.0–8.0)

## 2022-09-18 MED ORDER — AMOXICILLIN-POT CLAVULANATE 875-125 MG PO TABS
1.0000 | ORAL_TABLET | Freq: Two times a day (BID) | ORAL | 0 refills | Status: DC
  Filled 2022-09-18: qty 20, 10d supply, fill #0

## 2022-09-18 MED ORDER — CEFPODOXIME PROXETIL 200 MG PO TABS
200.0000 mg | ORAL_TABLET | Freq: Two times a day (BID) | ORAL | 0 refills | Status: DC
  Filled 2022-09-18: qty 20, 10d supply, fill #0

## 2022-09-18 MED ORDER — IBUPROFEN 600 MG PO TABS
600.0000 mg | ORAL_TABLET | Freq: Four times a day (QID) | ORAL | 0 refills | Status: DC | PRN
  Filled 2022-09-18: qty 30, 8d supply, fill #0

## 2022-09-18 MED ORDER — IBUPROFEN 400 MG PO TABS
600.0000 mg | ORAL_TABLET | Freq: Once | ORAL | Status: AC
  Administered 2022-09-18: 600 mg via ORAL
  Filled 2022-09-18: qty 1

## 2022-09-18 MED ORDER — CEFUROXIME AXETIL 250 MG PO TABS
250.0000 mg | ORAL_TABLET | Freq: Two times a day (BID) | ORAL | 0 refills | Status: DC
  Filled 2022-09-18: qty 20, 10d supply, fill #0

## 2022-09-18 NOTE — ED Triage Notes (Signed)
Patient here POV from Home.  Endorses Intermittent Dental Pain to Bilateral lower Molar for "a little while". No Known Fevers or Drainage. Noted Swelling mainly to right Side.   NAD Noted during Triage. A&Ox4. GCS 15. Ambulatory.

## 2022-09-18 NOTE — Discharge Instructions (Addendum)
As discussed, we will change antibiotics for treatment of both your dental pain as well as urinary tract infection.  I have also sent in urine culture and you will be called if the bacteria that we found does not match up with the antibiotic that we have prescribed.  Recommend calling a dentist on your resource sheet to set up an appointment.  Recommend continue use of Tylenol/Motrin at home as needed for pain.  Please do not hesitate to return to emergency department for worrisome signs and symptoms we discussed become apparent.

## 2022-09-18 NOTE — ED Notes (Signed)
Reviewed AVS/discharge instruction with patient. Time allotted for and all questions answered. Patient is agreeable for d/c and escorted to ed exit by staff.  

## 2022-09-18 NOTE — ED Provider Notes (Signed)
[place] EMERGENCY DEPARTMENT AT [facility] Provider Note   CSN: [id number] Arrival date & time: 09/18/22  1514     History  Chief Complaint  Patient presents with   Dental Pain    Kristin [name] is a 47 y.o. female.   Dental Pain   47 year old female presents emergency department with complaints of dental pain as well as urinary frequency.  Patient reports bilateral lower dental pain.  States she has known fractured teeth bilateral molars.  States she has had intermittent pain and swelling of her right lower molar for the past several months.  Reports acute worsening over the past 2 to 3 days of one of her left lower molars.  Has noted swelling in bilateral areas.  Denies any difficulty breathing/swallowing, fever.  Has not followed up with dentistry in the outpatient setting due to just receiving new insurance.  Past medical history significant for CHF, cardiomyopathy stress-induced, neuropathy, substance abuse, bilateral chronic low back pain without sciatica, recurrent UTI, esophagitis, hypertension, alcohol use disorder, bipolar 1 disorder, insomnia  Home Medications Prior to Admission medications   Medication Sig Start Date End Date Taking? Authorizing Provider  cefUROXime (CEFTIN) 250 MG tablet Take 1 tablet (250 mg total) by mouth 2 (two) times daily with a meal. 09/18/22  Yes Sherian Maroon A, PA  ibuprofen (ADVIL) 600 MG tablet Take 1 tablet (600 mg total) by mouth every 6 (six) hours as needed. 09/18/22  Yes Sherian Maroon A, PA  acetaminophen (TYLENOL) 500 MG tablet Take 1 tablet (500 mg total) by mouth every 8 (eight) hours as needed for mild pain. 07/04/19   Marthenia Rolling, DO  dicyclomine (BENTYL) 10 MG capsule Take by mouth. 12/07/21   [provider]  famotidine (PEPCID) 20 MG tablet Take 1 tablet (20 mg total) by mouth 2 (two) times daily. 02/01/22 06/01/22  Jerre Simon, MD  gabapentin (NEURONTIN) 300 MG capsule Take 1 capsule (300 mg total) by mouth 3  (three) times daily. 1 tablet in the morning and 2 at night before bed. 08/21/22   Cora Collum, DO  hydrOXYzine (ATARAX) 10 MG tablet Take 1 tablet (10 mg total) by mouth 3 (three) times daily as needed for anxiety or itching (insomnia). 05/09/22   Littie Deeds, MD  hydrOXYzine (ATARAX) 50 MG tablet Take 1-2 tablets (50-100 mg total) by mouth at bedtime as needed. 06/27/22   Fayette Pho, MD  lisinopril-hydrochlorothiazide (ZESTORETIC) 10-12.5 MG tablet Take 1 tablet by mouth daily. 10/07/21   [provider]  LORazepam (ATIVAN) 0.5 MG tablet Take 0.5 mg by mouth every 8 (eight) hours. 03/23/22   [provider]  methocarbamol (ROBAXIN) 500 MG tablet Take 1 tablet (500 mg total) by mouth 2 (two) times daily. 08/21/22   Cora Collum, DO  naltrexone (DEPADE) 50 MG tablet Take 50 mg by mouth daily. Patient not taking: Reported on 01/06/2022 12/29/21   [provider]  nicotine (NICODERM CQ - DOSED IN MG/24 HOURS) 14 mg/24hr patch Place 1 patch (14 mg total) onto the skin daily. 01/13/22   Lance Muss, MD  pantoprazole (PROTONIX) 40 MG tablet Take 1 tablet (40 mg total) by mouth daily. 12/11/21 01/10/22  Palumbo, April, MD  traZODone (DESYREL) 50 MG tablet Take 1 tablet (50 mg total) by mouth at bedtime as needed for sleep. 02/27/22   Jerre Simon, MD  triamcinolone cream (KENALOG) 0.1 % Apply 1 Application topically 2 (two) times daily. DO NOT USE ON FACE 08/01/22  Shelby Mattocks, DO  fluticasone (FLONASE) 50 MCG/ACT nasal spray Place 2 sprays into both nostrils daily. Patient taking differently: Place 2 sprays into both nostrils daily as needed for allergies.  02/25/17 04/14/19  Belinda Fisher, PA-C  promethazine (PHENERGAN) 25 MG tablet Take 1 tablet (25 mg total) by mouth every 6 (six) hours as needed for nausea or vomiting. 04/17/18 08/29/18  Jacalyn Lefevre, MD      Allergies    Sumatriptan and Other    Review of Systems   Review of Systems  All other systems  reviewed and are negative.   Physical Exam Updated Vital Signs BP 96/76 (BP Location: Right Arm)   Pulse 97   Temp 98 F (36.7 C) (Oral)   Resp 20   Ht 5\' 5"  (1.651 m)   Wt 77.1 kg   SpO2 97%   BMI 28.29 kg/m  Physical Exam Vitals and nursing note reviewed.  Constitutional:      General: She is not in acute distress.    Appearance: She is well-developed.  HENT:     Head: Normocephalic and atraumatic.     Mouth/Throat:      Comments: Patient with infected molars and area as depicted above with surrounding gingival tenderness to palpation.  No obvious palpable fluctuance..  Multiple carious teeth.  Uvula midline Symmetric with phonation.  No posterior pharyngeal erythema.  Tonsils 0-1+ bilaterally no obvious exudate.  No sublingual or extremity swelling.  No trismus appreciated.  Patient tolerating oral secretions without difficulty. Eyes:     Conjunctiva/sclera: Conjunctivae normal.  Cardiovascular:     Rate and Rhythm: Normal rate and regular rhythm.     Heart sounds: No murmur heard. Pulmonary:     Effort: Pulmonary effort is normal. No respiratory distress.     Breath sounds: Normal breath sounds.  Abdominal:     Palpations: Abdomen is soft.     Tenderness: There is abdominal tenderness.     Comments: Suprapubic tenderness to palpation.  Musculoskeletal:        General: No swelling.     Cervical back: Neck supple.  Skin:    General: Skin is warm and dry.     Capillary Refill: Capillary refill takes less than 2 seconds.  Neurological:     Mental Status: She is alert.  Psychiatric:        Mood and Affect: Mood normal.     ED Results / Procedures / Treatments   Labs (all labs ordered are listed, but only abnormal results are displayed) Labs Reviewed  URINALYSIS, ROUTINE W REFLEX MICROSCOPIC - Abnormal; Notable for the following components:      Result Value   APPearance HAZY (*)    Protein, ur TRACE (*)    Nitrite POSITIVE (*)    Leukocytes,Ua LARGE (*)     Bacteria, UA MANY (*)    All other components within normal limits  URINE CULTURE    EKG None  Radiology No results found.  Procedures Procedures    Medications Ordered in ED Medications  ibuprofen (ADVIL) tablet 600 mg (600 mg Oral Given 09/18/22 1713)    ED Course/ Medical Decision Making/ A&P                             Medical Decision Making Amount and/or Complexity of Data Reviewed Labs: ordered.  Risk Prescription drug management.   This patient presents to the ED for concern of dental pain, this  involves an extensive number of treatment options, and is a complaint that carries with it a high risk of complications and morbidity.  The differential diagnosis includes perirectal abscess, fractured tooth, dental carry, Lemierre's disease, necrotizing ulcerative gingivitis, cellulitis, peritonsillar abscess   Co morbidities that complicate the patient evaluation  See HPI   Additional history obtained:  Additional history obtained from EMR External records from outside source obtained and reviewed including hospital records   Lab Tests:  I Ordered, and personally interpreted labs.  The pertinent results include: UA concerning for infection with many bacteria, greater than 50 WBCs with large leukocytes, positive nitrites and trace proteins.   Imaging Studies ordered:  N/a   Cardiac Monitoring: / EKG:  The patient was maintained on a cardiac monitor.  I personally viewed and interpreted the cardiac monitored which showed an underlying rhythm of: Sinus rhythm   Consultations Obtained:  N/a   Problem List / ED Course / Critical interventions / Medication management  Dental pain, cystitis Reevaluation of the patient  showed that the patient stayed the same I have reviewed the patients home medicines and have made adjustments as needed   Social Determinants of Health:  Some cigarette use.  Denies illicit drug use.   Test / Admission -  Considered:  Dental pain, cystitis Vitals signs within normal range and stable throughout visit. 47 year old female presents emergency department with complaints of bilateral lower dental pain.  Patient with without clinical evidence of Ludwig angina, Lemierre's disease, peritonsillar abscess, periapical abscess, necrotizing ulcerative gingivitis.  Patient with obvious swelling externally along gumline as well as tenderness along gingiva.  Will treat empirically with antibiotics given gingival tenderness and external swelling.  Patient also with continued urinary frequency, malodorous urine now for 3 weeks.  She was prescribed Keflex in the outpatient setting on June 10 of which she noted some relief of symptoms but still with persistence and worsening in the past few days.  Urine culture was not obtained at that time.  Consulted pharmacy regarding the patient who recommended cefuroxime as monotherapy for patient's dental pain as well as cystitis.  Urine culture sent.  Patient recommended follow-up with dentistry in the outpatient setting to have affected teeth addressed as well as primary care for further assessment of urinary symptoms.  Treatment plan discussed at length with patient and she knowledge understanding was agreeable to said plan.  Patient overall well-appearing, febrile no acute distress. Worrisome signs and symptoms were discussed with the patient, and the patient acknowledged understanding to return to the ED if noticed. Patient was stable upon discharge.          Final Clinical Impression(s) / ED Diagnoses Final diagnoses:  Pain, dental  Cystitis    Rx / DC Orders ED Discharge Orders          Ordered    amoxicillin-clavulanate (AUGMENTIN) 875-125 MG tablet  Every 12 hours,   Status:  Discontinued        09/18/22 1623    ibuprofen (ADVIL) 600 MG tablet  Every 6 hours PRN        09/18/22 1623    cefpodoxime (VANTIN) 200 MG tablet  2 times daily,   Status:  Discontinued         09/18/22 1706    cefUROXime (CEFTIN) 250 MG tablet  2 times daily with meals        09/18/22 1743              Peter Garter, [place] 09/18/22  1610    Vanetta Mulders, MD 09/21/22 2249

## 2022-09-20 LAB — URINE CULTURE: Culture: >=100000 — AB

## 2022-09-21 LAB — URINE CULTURE

## 2022-09-22 ENCOUNTER — Telehealth (HOSPITAL_BASED_OUTPATIENT_CLINIC_OR_DEPARTMENT_OTHER): Payer: Self-pay | Admitting: *Deleted

## 2022-09-22 NOTE — Telephone Encounter (Signed)
Post ED Visit - Positive Culture Follow-up  Culture report reviewed by antimicrobial stewardship pharmacist: [name] [x]  Daylene Posey, Pharm.D. []  Celedonio Miyamoto, Pharm.D., BCPS AQ-ID []  Garvin Fila, Pharm.D., BCPS []  [place]ina Pillion, Pharm.D., BCPS []  [place], [address].D., BCPS, AAHIVP []  Estella Husk, Pharm.D., BCPS, AAHIVP []  Lysle Pearl, PharmD, BCPS []  Phillips Climes, PharmD, BCPS []  Agapito Games, PharmD, BCPS []  Verlan Friends, PharmD []  Mervyn Gay, PharmD, BCPS []  Vinnie Level, PharmD  [facility] []  Len Childs, PharmD []  Greer Pickerel, PharmD []  Adalberto Cole, PharmD []  Perlie Gold, Rph []  Lonell Face) Jean Rosenthal, PharmD []  Earl Many, PharmD []  Junita Push, PharmD []  Dorna Leitz, PharmD []  Terrilee Files, PharmD []  Lynann Beaver, PharmD []  Keturah Barre, PharmD []  Loralee Pacas, PharmD []  Bernadene Person, PharmD   Positive urine culture Treated with Amoxicillin-Pot Clavulanate, organism sensitive to the same and no further patient follow-up is required at this time.  Virl Axe [facility] 09/22/2022, 8:42 AM

## 2022-10-17 ENCOUNTER — Ambulatory Visit (INDEPENDENT_AMBULATORY_CARE_PROVIDER_SITE_OTHER): Payer: Medicaid Other | Admitting: Student

## 2022-10-17 ENCOUNTER — Other Ambulatory Visit: Payer: Self-pay

## 2022-10-17 VITALS — BP 144/106 | HR 90 | Temp 98.2°F | Ht 65.0 in | Wt 168.8 lb

## 2022-10-17 DIAGNOSIS — K209 Esophagitis, unspecified without bleeding: Secondary | ICD-10-CM

## 2022-10-17 DIAGNOSIS — N12 Tubulo-interstitial nephritis, not specified as acute or chronic: Secondary | ICD-10-CM

## 2022-10-17 DIAGNOSIS — R350 Frequency of micturition: Secondary | ICD-10-CM | POA: Diagnosis not present

## 2022-10-17 DIAGNOSIS — R6889 Other general symptoms and signs: Secondary | ICD-10-CM

## 2022-10-17 DIAGNOSIS — K219 Gastro-esophageal reflux disease without esophagitis: Secondary | ICD-10-CM

## 2022-10-17 LAB — POCT URINALYSIS DIP (MANUAL ENTRY)
Bilirubin, UA: NEGATIVE
Glucose, UA: NEGATIVE mg/dL
Nitrite, UA: POSITIVE — AB
Protein Ur, POC: 30 mg/dL — AB
Spec Grav, UA: >=1.03 — AB (ref 1.010–1.025)
Urobilinogen, UA: 0.2 E.U./dL
pH, UA: 5.5 (ref 5.0–8.0)

## 2022-10-17 LAB — POCT UA - MICROSCOPIC ONLY: WBC, Ur, HPF, POC: >20 (ref 0–5)

## 2022-10-17 MED ORDER — CEFDINIR 300 MG PO CAPS
300.0000 mg | ORAL_CAPSULE | Freq: Two times a day (BID) | ORAL | 0 refills | Status: DC
Start: 2022-10-18 — End: 2023-01-18

## 2022-10-17 MED ORDER — CEFTRIAXONE SODIUM 1 G IJ SOLR
1.0000 g | Freq: Once | INTRAMUSCULAR | Status: AC
Start: 2022-10-17 — End: 2022-10-17
  Administered 2022-10-17: 1 g via INTRAMUSCULAR

## 2022-10-17 MED ORDER — PROMETHAZINE HCL 25 MG PO TABS
25.0000 mg | ORAL_TABLET | Freq: Once | ORAL | Status: AC
Start: 2022-10-17 — End: 2022-10-17
  Administered 2022-10-17: 25 mg via ORAL

## 2022-10-17 NOTE — Patient Instructions (Addendum)
Kristin [name],  Your urine definitely looks infected.  I want to treat you with an injection of antibiotics today. This will cover you for 24 hours. Then you should start taking the oral antibiotic cefdinir twice daily TOMORROW and continue for the next six days. I want to see you back on Friday to make sure you're moving in the right direction.   For your GERD, we really need to get you to a GI specialist. Since you've seen Dr. Bosie Clos before, you can call his office at [phone] and ask for an appointment. In case you're not in their system any longer, we're also sending a formal referral. In the meantime, you can continue taking your Protonix twice daily.    I'm checking some labs today, I will call you if we need to make any changes based on this.   Eliezer Mccoy, MD

## 2022-10-17 NOTE — Progress Notes (Unsigned)
    SUBJECTIVE:   CHIEF COMPLAINT / HPI:   Urinary Frequency History of recurrent UTI. Most recently on 7/8 diagnosed at [facility] at treated with cefuroxime. Culture grew E coli that was Ampicillin and Trim/Sulfa resistant, intermediate to amp/sulbactam and cipro.  Accompanied by nausea and vomiting, also with left sided flank pain. She has chronic N/V thought 2/2 GERD For which she takes daily Protonix, but this week has been worse than most. She doesn't think she ever really got back to herself after the last UTI. She has been vomiting near daily and her PO intake is not as good as it usually is. She notes 10 lbs of weight loss, though per chart review this is since April, weight stable over the past four weeks (170>168).  Denies any documented fevers, but does tell me that she "feels hot all the time."   PERTINENT  PMH / PSH: Bipolar disorder, obesity, HFrEF, HTN, GERD, bilateral knee OA   OBJECTIVE:   BP (!) 144/106   Pulse 90   Temp 98.2 F (36.8 C)   Ht 5\' 5"  (1.651 m)   Wt 168 lb 12.8 oz (76.6 kg)   SpO2 100%   BMI 28.09 kg/m   Physical Exam Constitutional:      Comments: Ill-appearing but non-toxic  HENT:     Mouth/Throat:     Comments: Mucous membranes are moist  Cardiovascular:     Rate and Rhythm: Normal rate and regular rhythm.     Heart sounds: No murmur heard. Pulmonary:     Effort: Pulmonary effort is normal. No respiratory distress.     Breath sounds: No wheezing or rales.  Abdominal:     Comments: Mild TTP epigastrically, but without rebound or guarding. Abdomen is non-distended. There is ++CVA tenderness on the left, none on the right.       ASSESSMENT/PLAN:   Pyelonephritis Positive UA today. History of recurrent UTI. Systemic  symptoms of N/V and CVA tenderness make me concerned for pyelonephritis today. She is well-hydrated and not septic appearing, therefore believe she is appropriate for  outpatient management with close follow-up. - CTX x1 in  clinic today - Cefdinir to complete a full week of therapy - Urine culture sent - F/u scheduled for Friday 8/9 - Once beyond this acute infection, would consider urologic consult for recurrent UTIs - BMP, CBC, TSH given sensation of being "hot all the time"  Esophagitis determined by endoscopy Continues on her PPI. I do wonder if this could be contributing to her N/V? Only mild TTP on exam, so I do not think this is primary driver of her symptoms at present.  - Needs close GI follow-up. Saw Dr. Bosie Clos previously, so will send to [facility]  - Continue daily Protonix     J Dorothyann Gibbs, MD [facility]

## 2022-10-19 DIAGNOSIS — N12 Tubulo-interstitial nephritis, not specified as acute or chronic: Secondary | ICD-10-CM | POA: Insufficient documentation

## 2022-10-19 HISTORY — DX: Tubulo-interstitial nephritis, not specified as acute or chronic: N12

## 2022-10-19 NOTE — Assessment & Plan Note (Signed)
Continues on her PPI. I do wonder if this could be contributing to her N/V? Only mild TTP on exam, so I do not think this is primary driver of her symptoms at present.  - Needs close GI follow-up. Saw Dr. Bosie Clos previously, so will send to [facility]  - Continue daily Protonix

## 2022-10-19 NOTE — Assessment & Plan Note (Signed)
Positive UA today. History of recurrent UTI. Systemic  symptoms of N/V and CVA tenderness make me concerned for pyelonephritis today. She is well-hydrated and not septic appearing, therefore believe she is appropriate for  outpatient management with close follow-up. - CTX x1 in clinic today - Cefdinir to complete a full week of therapy - Urine culture sent - F/u scheduled for Friday 8/9 - Once beyond this acute infection, would consider urologic consult for recurrent UTIs - BMP, CBC, TSH given sensation of being "hot all the time"

## 2022-10-20 ENCOUNTER — Ambulatory Visit: Payer: Self-pay

## 2022-11-01 ENCOUNTER — Ambulatory Visit: Payer: Medicaid Other | Admitting: Family Medicine

## 2022-11-01 NOTE — Progress Notes (Deleted)
    SUBJECTIVE:   CHIEF COMPLAINT / HPI: back pain  ***  PERTINENT  PMH / PSH: Spinal stenosis with neurogenic claudication, Knee OA, HTN  OBJECTIVE:   There were no vitals taken for this visit.  ***  ASSESSMENT/PLAN:   There are no diagnoses linked to this encounter. No follow-ups on file.  Celine Mans, MD [facility]

## 2022-11-02 ENCOUNTER — Other Ambulatory Visit: Payer: Self-pay | Admitting: Family Medicine

## 2022-11-02 ENCOUNTER — Ambulatory Visit (INDEPENDENT_AMBULATORY_CARE_PROVIDER_SITE_OTHER): Payer: Medicaid Other | Admitting: Family Medicine

## 2022-11-02 ENCOUNTER — Other Ambulatory Visit: Payer: Self-pay

## 2022-11-02 ENCOUNTER — Encounter: Payer: Self-pay | Admitting: Family Medicine

## 2022-11-02 VITALS — BP 138/98 | HR 68 | Ht 64.0 in | Wt 170.8 lb

## 2022-11-02 DIAGNOSIS — M5442 Lumbago with sciatica, left side: Secondary | ICD-10-CM | POA: Diagnosis not present

## 2022-11-02 DIAGNOSIS — M48062 Spinal stenosis, lumbar region with neurogenic claudication: Secondary | ICD-10-CM

## 2022-11-02 DIAGNOSIS — I10 Essential (primary) hypertension: Secondary | ICD-10-CM | POA: Diagnosis present

## 2022-11-02 DIAGNOSIS — G8929 Other chronic pain: Secondary | ICD-10-CM | POA: Diagnosis not present

## 2022-11-02 DIAGNOSIS — M5441 Lumbago with sciatica, right side: Secondary | ICD-10-CM

## 2022-11-02 MED ORDER — METHOCARBAMOL 500 MG PO TABS
500.0000 mg | ORAL_TABLET | Freq: Two times a day (BID) | ORAL | 0 refills | Status: DC
Start: 2022-11-02 — End: 2023-04-17

## 2022-11-02 MED ORDER — GABAPENTIN 300 MG PO CAPS
300.0000 mg | ORAL_CAPSULE | Freq: Three times a day (TID) | ORAL | 1 refills | Status: DC
Start: 2022-11-02 — End: 2023-03-23

## 2022-11-02 MED ORDER — LISINOPRIL-HYDROCHLOROTHIAZIDE 10-12.5 MG PO TABS: 1.0000 | ORAL_TABLET | Freq: Every day | ORAL | 0 refills | Status: DC

## 2022-11-02 NOTE — Assessment & Plan Note (Signed)
Elevated in office in the setting of medication nonadherance.  - D/C furosemide d/t not tolerating side effects - restart Lisinopril-HCTZ today, patient advised to follow-up PCP within 1 month.

## 2022-11-02 NOTE — Patient Instructions (Addendum)
It was great to see you today! Thank you for choosing [facility] for your primary care.  Today we addressed: Your back and knee pain. I referred you to see orthopaedic surgery and physical therapy. If you have not heard from them in 2-3 weeks, please call. I also refilled your gabapentin and muscle relaxer. Your BP was elevated during today's visit. Please STOP taking your furosemide and start taking your lisinopril. This was sent to your pharmacy as well Please schedule a full physical with your PCP   If you haven't already, sign up for My Chart to have easy access to your labs results, and communication with your primary care physician.   I recommend that you always bring your medications to each appointment as this makes it easy to ensure you are on the correct medications and helps [place] not miss refills when you need them. Call the clinic at [phone] if your symptoms worsen or you have any concerns.  Please arrive 15 minutes before your appointment to ensure smooth check in process.  We appreciate your efforts in making this happen.  Thank you for allowing me to participate in your care, Kristin Morales, MD  11/02/2022, 12:26 PM PGY-1, [facility]

## 2022-11-02 NOTE — Progress Notes (Deleted)
      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      PERTINENT  PMH / PSH:  HFrEF HTN Migraines Neuropathy  OA  Alcohol use  Bipolar  GAD Spinal stenosis of lumbar spine    OBJECTIVE:    BP (!) 139/98   Pulse 68   Ht 5\' 4"  (1.626 m)   Wt 170 lb 12.8 oz (77.5 kg)   BMI 29.32 kg/m   General: Alert and oriented in no apparent distress Heart: Regular rate and rhythm with no murmurs appreciated Lungs: CTA bilaterally, no wheezing Abdomen: Bowel sounds present, no abdominal pain Skin: Warm and dry Extremities: No lower extremity edema Back Exam:  Inspection: Unremarkable  Palpable tenderness: None. Range of Motion:  Flexion 45 deg; Extension 45 deg; Side Bending to 45 deg bilaterally; Rotation to 45 deg bilaterally  Leg strength: Quad: 5/5 Hamstring: 5/5 Hip flexor: 5/5 Hip abductors: 5/5  Strength at foot: Plantar-flexion: 5/5 Dorsi-flexion: 5/5 Eversion: 5/5 Inversion: 5/5  Sensory change: Gross sensation intact to all lumbar and sacral dermatomes.  Reflexes: 2+ at both patellar tendons, 2+ at achilles tendons, Babinski's downgoing.  Gait unremarkable. SLR laying: Negative  XSLR laying: Negative  FABER: negative.     ASSESSMENT/PLAN:    There are no diagnoses linked to this encounter. No follow-ups on file.  - resend gabapentin and robaxin -    Kristin Morales, MD [facility]

## 2022-11-02 NOTE — Progress Notes (Signed)
Acute Office Visit  Subjective:     Patient ID: Kristin [name], female    DOB: 10-13-1975, 47 y.o.   MRN: [id number] Patient presents for chronic back pain.  She reports that this is a chronic issue that she has had for many years however it has been feeling worse and that is why she showed up today.  She reports that she has previously been referred to orthopedic surgery as well as neurosurgery however has been unable to go to those appointments due to insurance issues.  She reports that she currently has a new insurance and would like a referral back to these places.  She also reports that she has gotten injections in both her back and her knees in the past and is interested in getting those today as well.  She states unable to obtain gabapentin and other medications due to insurance issues at the pharmacy.  Patient also reports that she has hypertension and was given a new medication that is making her pee a lot that she has not been taking it.  She reports that she used to be on lisinopril and this worked really well for her and she is unsure why she was taken off of that medication.  Patient denies any headaches, changes in vision, chest pain, palpitations.  Patient is a endorse some swelling in bilateral knees as well as mild swelling in both of her feet.  PERTINENT  PMH / PSH:  HFrEF HTN Migraines Neuropathy  OA  Alcohol use  Bipolar  GAD Spinal stenosis of lumbar spine     Objective:    BP (!) 138/98   Pulse 68   Ht 5\' 4"  (1.626 m)   Wt 170 lb 12.8 oz (77.5 kg)   BMI 29.32 kg/m     General: Alert and oriented in no apparent distress Heart: Regular rate and rhythm with no murmurs appreciated Lungs: CTA bilaterally, no wheezing Abdomen: Bowel sounds present, no abdominal pain Skin: Warm and dry Extremities: trace pitting edema to bilateral ankles  MSK: swelling with mild medial effusion to bilateral knees, not warm, not erythematous   Back Exam:  Inspection:  Unremarkable  Palpable tenderness: TTP at paraspinal muscles at L4-L5 level  Range of Motion:   Hip Flexion 45 deg; Extension 45 deg limited by pain  Knee flexion: 45 degrees, extension 45 degrees limited by pain  Leg strength: Quad: 5/5 Hamstring: 5/5 Hip flexor: 5/5 Hip abductors: 5/5  Strength at foot: Plantar-flexion: 5/5 Dorsi-flexion: 5/5 Eversion: 5/5 Inversion: 5/5  Sensory change: Decreased sensation to bilateral feet and anterior shins, along L4-L5 dermatomes  Gait slow but steady      Assessment & Plan:   Problem List Items Addressed This Visit     Spinal stenosis of lumbar region with neurogenic claudication    Chronic issue for patient. Has previously been referred to orthopedics, neurosurgery, PT however has been unable to follow due to insurance issues.  Recent MRI showed L4-5 degeneration with left paracentral to foraminal herniation that contacts the left L5 and L4 nerve roots without compression. - referral to PT and orthopaedic surgery - gabapentin 300mg  TID - robaxin 500mg  BID        Relevant Medications   gabapentin (NEURONTIN) 300 MG capsule   methocarbamol (ROBAXIN) 500 MG tablet   Essential hypertension - Primary    Elevated in office in the setting of medication nonadherance.  - D/C furosemide d/t not tolerating side effects - restart Lisinopril-HCTZ today, patient advised to  follow-up PCP within 1 month.       Relevant Medications   lisinopril-hydrochlorothiazide (ZESTORETIC) 10-12.5 MG tablet   Other Visit Diagnoses     Chronic bilateral low back pain with bilateral sciatica       Relevant Medications   gabapentin (NEURONTIN) 300 MG capsule   methocarbamol (ROBAXIN) 500 MG tablet   Other Relevant Orders   Ambulatory referral to Orthopedic Surgery   Ambulatory referral to Physical Therapy       Meds ordered this encounter  Medications   gabapentin (NEURONTIN) 300 MG capsule    Sig: Take 1 capsule (300 mg total) by mouth 3 (three) times  daily. 1 tablet in the morning and 2 at night before bed.    Dispense:  90 capsule    Refill:  1   methocarbamol (ROBAXIN) 500 MG tablet    Sig: Take 1 tablet (500 mg total) by mouth 2 (two) times daily.    Dispense:  20 tablet    Refill:  0   lisinopril-hydrochlorothiazide (ZESTORETIC) 10-12.5 MG tablet    Sig: Take 1 tablet by mouth daily.    Dispense:  30 tablet    Refill:  0   Patient to follow up with PCP for management of chronic issues.   Hal Morales, MD PGY-1  [name]

## 2022-11-02 NOTE — Progress Notes (Signed)
    SUBJECTIVE:   CHIEF COMPLAINT / HPI:   Back Pain: -Chronic problem -MR 2022 no changes  -Ongoing for several months per chart review -Has previously been sent to PT, orthopedics, pain management, NSGY      PERTINENT  PMH / PSH:  HFrEF HTN Migraines Neuropathy  OA  Alcohol use  Bipolar  GAD Spinal stenosis of lumbar spine   OBJECTIVE:   There were no vitals taken for this visit.  General: Alert and oriented in no apparent distress Heart: Regular rate and rhythm with no murmurs appreciated Lungs: CTA bilaterally, no wheezing Abdomen: Bowel sounds present, no abdominal pain Skin: Warm and dry Extremities: No lower extremity edema Back Exam:  Inspection: Unremarkable  Palpable tenderness: None. Range of Motion:  Flexion 45 deg; Extension 45 deg; Side Bending to 45 deg bilaterally; Rotation to 45 deg bilaterally  Leg strength: Quad: 5/5 Hamstring: 5/5 Hip flexor: 5/5 Hip abductors: 5/5  Strength at foot: Plantar-flexion: 5/5 Dorsi-flexion: 5/5 Eversion: 5/5 Inversion: 5/5  Sensory change: Gross sensation intact to all lumbar and sacral dermatomes.  Reflexes: 2+ at both patellar tendons, 2+ at achilles tendons, Babinski's downgoing.  Gait unremarkable. SLR laying: Negative  XSLR laying: Negative  FABER: negative.   ASSESSMENT/PLAN:   There are no diagnoses linked to this encounter. No follow-ups on file.  Alfredo Martinez, MD [facility]

## 2022-11-02 NOTE — Assessment & Plan Note (Signed)
Chronic issue for patient. Has previously been referred to orthopedics, neurosurgery, PT however has been unable to follow due to insurance issues.  Recent MRI showed L4-5 degeneration with left paracentral to foraminal herniation that contacts the left L5 and L4 nerve roots without compression. - referral to PT and orthopaedic surgery - gabapentin 300mg  TID - robaxin 500mg  BID

## 2022-11-29 ENCOUNTER — Telehealth: Payer: Self-pay

## 2022-11-29 NOTE — Telephone Encounter (Signed)
Kristin [name] with [facility] LVM on nurse line in regards to medical records release request.   She reports she sent the medical records release last week for a hearing tomorrow. However, she reports they have not received the records.   I spoke with Nehemiah Settle and she reports she faxed the records last week to [employer] and [employer]. She reports she will fax records again today.   I attempted to contact Kristin [name] to give update. However, left message advising of above.

## 2022-12-28 ENCOUNTER — Ambulatory Visit (INDEPENDENT_AMBULATORY_CARE_PROVIDER_SITE_OTHER): Payer: 59 | Admitting: Student

## 2022-12-28 ENCOUNTER — Encounter: Payer: Self-pay | Admitting: Student

## 2022-12-28 VITALS — BP 145/90 | HR 94 | Temp 98.5°F | Wt 165.2 lb

## 2022-12-28 DIAGNOSIS — J069 Acute upper respiratory infection, unspecified: Secondary | ICD-10-CM | POA: Diagnosis not present

## 2022-12-28 DIAGNOSIS — I10 Essential (primary) hypertension: Secondary | ICD-10-CM

## 2022-12-28 DIAGNOSIS — F319 Bipolar disorder, unspecified: Secondary | ICD-10-CM

## 2022-12-28 MED ORDER — LISINOPRIL-HYDROCHLOROTHIAZIDE 10-12.5 MG PO TABS
1.0000 | ORAL_TABLET | Freq: Every day | ORAL | 3 refills | Status: DC
Start: 2022-12-28 — End: 2023-05-31

## 2022-12-28 MED ORDER — BENZONATATE 100 MG PO CAPS
100.0000 mg | ORAL_CAPSULE | Freq: Three times a day (TID) | ORAL | 0 refills | Status: DC | PRN
Start: 2022-12-28 — End: 2023-04-17

## 2022-12-28 NOTE — Progress Notes (Addendum)
    SUBJECTIVE:   CHIEF COMPLAINT / HPI:   Patient is a 47 year old female presenting today for 3 days of cough. Cough has been productive of clear sputum. Associated symptom including body ache, runny nose and nasal congestion Has known sick contact in granddaughter and mom who both getting over COLD He has tried DayQuil and NyQuil. Denies any fever, chest pain or shortness of breath.  PERTINENT  PMH / PSH: Reviewed   OBJECTIVE:   BP (!) 145/90   Pulse 94   Temp 98.5 F (36.9 C)   Wt 165 lb 3.2 oz (74.9 kg)   SpO2 97%   BMI 28.36 kg/m    Physical Exam General: Alert, well appearing, NAD HEENT: No tonsillar exudate, positive pharyngeal erythema Cardiovascular: RRR, No Murmurs, Normal S2/S2 Respiratory: CTAB, No wheezing or Rales Abdomen: No distension or tenderness Psych: Pleasant, normal affect and good judgment   ASSESSMENT/PLAN:   Essential hypertension BP today is elevated.  Patient has been without medication for weeks.  Currently asymptomatic and denies any headache, shortness of breath or chest pain.  Expressed desire to change blood pressure medication due to associated excessive urination with her prior blood pressure medication. -Advised patient to keep daily blood pressure logs -Refilled patient's combo medication of lisinopril-HCTZ. -Will follow-up in a week to reassess blood pressure and discuss medication change. -Will considering separation of medication to ACE inhibitor and an intermittent use of diuretics given history of Hf  Viral illness 47 y.o. year old presents with cough, congestion, rhinorrhea and body ache. She is afebrile today and on exam has oropharyngeal erythema,  good work of breathing on RA and clear breath sounds bilaterally. Overall presentation and exam is consistent with viral URI.   - Discussed conservative management with warm water, honey and lemon. - Recommended tylenol or ibuprofen for body ache -Rx Tessalon Perles for cough -  Encouraged adequate hydration for patient.  - Outline signs and symptoms that will warrant ED visit or return for further assessment.     Elevated PHQ-9/depressed mood PHQ-9 of 19 and negative question 9.  Denies any active HI/SI.  Endorses life stressors including work mostly attributing to her next mood and also recent illness.  Today on exam pleasant and normal affect.  Currently on trazodone.   Jerre Simon, MD [facility]

## 2022-12-28 NOTE — Assessment & Plan Note (Addendum)
BP today is elevated.  Patient has been without medication for weeks.  Currently asymptomatic and denies any headache, shortness of breath or chest pain.  Expressed desire to change blood pressure medication due to associated excessive urination with her prior blood pressure medication. -Advised patient to keep daily blood pressure logs -Refilled patient's combo medication of lisinopril-HCTZ. -Will follow-up in a week to reassess blood pressure and discuss medication change. -Will considering separation of medication to ACE inhibitor and an intermittent use of diuretics given history of Hf

## 2022-12-28 NOTE — Patient Instructions (Signed)
It was wonderful to see you today. Thank you for allowing me to be a part of your care. Below is a short summary of what we discussed at your visit today:  Suspect your symptoms are most likely viral infection.  Have sent in prescription for Tessalon Perles for the cough.  You can do ibuprofen/Tylenol for the body aches or pain.  Make sure you are staying well-hydrated and you can do warm water, honey and lemon.   If you have any questions or concerns, please do not hesitate to contact us via phone or MyChart message.   Jerre Simon, MD [name]

## 2022-12-28 NOTE — Assessment & Plan Note (Deleted)
PHQ-9 today elevated at 19 negative score on question 9.  Patient denies active HI/SI.  Does like overall has been stressed due to life stressors including work.  Currently on trazodone and endorses good compliance with medication.

## 2023-01-16 ENCOUNTER — Ambulatory Visit: Payer: 59 | Admitting: Student

## 2023-01-18 ENCOUNTER — Ambulatory Visit (INDEPENDENT_AMBULATORY_CARE_PROVIDER_SITE_OTHER): Payer: 59 | Admitting: Student

## 2023-01-18 ENCOUNTER — Ambulatory Visit: Payer: 59

## 2023-01-18 VITALS — BP 140/101 | HR 80 | Ht 64.0 in | Wt 165.8 lb

## 2023-01-18 DIAGNOSIS — N390 Urinary tract infection, site not specified: Secondary | ICD-10-CM | POA: Insufficient documentation

## 2023-01-18 DIAGNOSIS — R300 Dysuria: Secondary | ICD-10-CM

## 2023-01-18 DIAGNOSIS — N3000 Acute cystitis without hematuria: Secondary | ICD-10-CM | POA: Diagnosis not present

## 2023-01-18 LAB — POCT URINALYSIS DIP (CLINITEK)
Blood, UA: NEGATIVE
Glucose, UA: NEGATIVE mg/dL
Nitrite, UA: NEGATIVE
Spec Grav, UA: 1.025 (ref 1.010–1.025)
Urobilinogen, UA: 1 U/dL
pH, UA: 6 (ref 5.0–8.0)

## 2023-01-18 MED ORDER — CEPHALEXIN 500 MG PO CAPS: 500.0000 mg | ORAL_CAPSULE | Freq: Three times a day (TID) | ORAL | 0 refills | Status: DC

## 2023-01-18 NOTE — Assessment & Plan Note (Signed)
Presented with a few days of frequent urination, foul-smelling urine consistent with UTI POC urinalysis negative for nitrites, positive for leukocytes Given recurrent UTI, will treat with Keflex 500 mg 3 times daily for 5 days and send urine for culture Reviewed prior urine cultures, which are sensitive to CTX Follow-up in 3 to 4 weeks, patient to schedule appointment today

## 2023-01-18 NOTE — Patient Instructions (Addendum)
It was great seeing you today.  Sent an antibiotic to your pharmacy to treat you for UTI Please schedule follow-up visit at the front desk before you leave today in the next 3 to 4 weeks   If you have any questions or concerns, please feel free to call the clinic.   Have a wonderful day,  Dr. Darral Dash [facility] [phone]

## 2023-01-18 NOTE — Progress Notes (Signed)
    SUBJECTIVE:   CHIEF COMPLAINT / HPI:   Kristin [name] is a 47 year-old female here to discuss UTI symptoms.  Here with frequent urination, foul smelling urine.  Denies burning, hesitancy.  No fever, chills, nausea or vomiting. 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   Has a history of recurrent UTI- 08/21/22, 10/17/22; culture showing > 100,000 CFU E.coli.  PERTINENT  PMH / PSH: Recurrent UTI  OBJECTIVE:   BP (!) 140/101   Pulse 80   Ht 5\' 4"  (1.626 m)   Wt 165 lb 12.8 oz (75.2 kg)   SpO2 100%   BMI 28.46 kg/m   General: Pleasant, well-appearing, no distress CV: RRR Respiratory: Normal WOB on room air Skin: warm, dry   ASSESSMENT/PLAN:   UTI (urinary tract infection) Presented with a few days of frequent urination, foul-smelling urine consistent with UTI POC urinalysis negative for nitrites, positive for leukocytes Given recurrent UTI, will treat with Keflex 500 mg 3 times daily for 5 days and send urine for culture Reviewed prior urine cultures, which are sensitive to CTX Follow-up in 3 to 4 weeks, patient to schedule appointment today     Darral Dash, DO [facility]

## 2023-01-20 LAB — URINE CULTURE

## 2023-02-01 ENCOUNTER — Telehealth: Payer: Self-pay

## 2023-02-01 NOTE — Telephone Encounter (Signed)
Patients walks into [facility] complaining of back pain and requesting another round of antibiotics.  She reports she was moving and lost the antibiotic given to her for a UTI on 11/7. She reports she only had a couple of days left of the bottle.   She reports continued back pain and "dark" urine. She denies any dysuria, fevers or chills.   Advised I will reach out to the provider who saw patient.  Encouraged good hydration.

## 2023-02-05 ENCOUNTER — Other Ambulatory Visit: Payer: Self-pay | Admitting: Student

## 2023-02-05 MED ORDER — CEPHALEXIN 500 MG PO CAPS: 500.0000 mg | ORAL_CAPSULE | Freq: Three times a day (TID) | ORAL | 0 refills | Status: AC

## 2023-02-06 NOTE — Telephone Encounter (Signed)
Patient contacted.   Patient advised of additional treatment to her pharmacy.   Patient advised if symptoms do not improve a clinic visit will be needed.   Patient voiced understanding and was appreciative.

## 2023-02-27 ENCOUNTER — Ambulatory Visit (INDEPENDENT_AMBULATORY_CARE_PROVIDER_SITE_OTHER): Payer: 59 | Admitting: Student

## 2023-02-27 ENCOUNTER — Ambulatory Visit: Payer: 59

## 2023-02-27 VITALS — BP 122/80 | HR 96 | Ht 64.0 in | Wt 167.0 lb

## 2023-02-27 DIAGNOSIS — R350 Frequency of micturition: Secondary | ICD-10-CM

## 2023-02-27 LAB — POCT URINALYSIS DIP (MANUAL ENTRY)
Bilirubin, UA: NEGATIVE
Glucose, UA: NEGATIVE mg/dL
Ketones, POC UA: NEGATIVE mg/dL
Nitrite, UA: POSITIVE — AB
Protein Ur, POC: NEGATIVE mg/dL
Spec Grav, UA: 1.02 (ref 1.010–1.025)
Urobilinogen, UA: 0.2 U/dL
pH, UA: 5.5 (ref 5.0–8.0)

## 2023-02-27 LAB — POCT UA - MICROSCOPIC ONLY: WBC, Ur, HPF, POC: >20 (ref 0–5)

## 2023-02-27 MED ORDER — NITROFURANTOIN MONOHYD MACRO 100 MG PO CAPS
100.0000 mg | ORAL_CAPSULE | Freq: Two times a day (BID) | ORAL | 0 refills | Status: AC
Start: 2023-02-27 — End: 2023-03-04

## 2023-02-27 NOTE — Progress Notes (Unsigned)
    SUBJECTIVE:   CHIEF COMPLAINT / HPI:   Kristin [name] is a 47 year old with PMHx of recurrent UtI presenting with urinary frequency and odorous urine.   UTI Here with urinary frequency/urgency and malodorous urine, no dysuria. Denies vaginal discharge or bleeding. Denies fever or chills.   Has a history of recurrent UTI- 08/21/22, 10/17/22; culture showing > 100,000 CFU E.coli. The 10/2022 UTI was complicated by pyelo and treated with CTX x1 followed by a course of cefdinir.   She returned in November with similar symptoms but had a negative culture at that time.   Denies any N/V or fever.   PERTINENT  PMH / PSH: recurrent UTI  OBJECTIVE:   BP 122/80   Pulse 96   Ht 5\' 4"  (1.626 m)   Wt 75.8 kg   SpO2 100%   BMI 28.67 kg/m   Physical Exam Constitutional:      Appearance: Normal appearance.  Cardiovascular:     Rate and Rhythm: Normal rate and regular rhythm.  Pulmonary:     Effort: Pulmonary effort is normal.     Breath sounds: Normal breath sounds.  Abdominal:     Palpations: Abdomen is soft.     Tenderness: There is no abdominal tenderness. There is no right CVA tenderness, left CVA tenderness, guarding or rebound.  Neurological:     Mental Status: She is alert.     POCUS Bladder Scan post-void residual of 0cc. Limited ultrasound obtained for learning purposes. Formal testing and follow up as below. Discussed limitations with patient.   ASSESSMENT/PLAN:   Assessment & Plan Urine frequency UA today is suspicious for UTI. This would be her third UTI of the year. Given the negative culture in the past, we did consider alternative causes of her symptoms such as DM2 (unlikely given last A1c 4.8%) and urinary retention with overflow incontinence (PVR 0cc on bladder scan today). No fever, systemic symptoms, or flank pain to suggest pyelonephritis.   Reviewed sensitivities from her past culture which was E coli resistant to Ampicillin, Tetracycline, and trim/sulfa.  -  Will treat with Macrobid 100mg  BID x5 days - Will send culture given history of polyresistant microbes  - If she has a short-interval recurrence, might benefit from prophylactic abx for recurrent UTI     Oscar La, Medical Student [facility]     I have evaluated this patient along with medical student Kristin [name] and reviewed the above note, making necessary revisions.  Dorothyann Gibbs, MD 03/01/2023, 9:37 AM PGY-3, [facility]

## 2023-03-02 LAB — URINE CULTURE

## 2023-03-05 ENCOUNTER — Ambulatory Visit: Payer: Self-pay | Admitting: Student

## 2023-03-22 ENCOUNTER — Ambulatory Visit (INDEPENDENT_AMBULATORY_CARE_PROVIDER_SITE_OTHER): Payer: 59 | Admitting: Family Medicine

## 2023-03-22 ENCOUNTER — Ambulatory Visit: Payer: 59 | Admitting: Family Medicine

## 2023-03-22 VITALS — BP 98/86 | HR 115 | Ht 64.0 in | Wt 164.0 lb

## 2023-03-22 DIAGNOSIS — M5442 Lumbago with sciatica, left side: Secondary | ICD-10-CM

## 2023-03-22 DIAGNOSIS — G8929 Other chronic pain: Secondary | ICD-10-CM | POA: Diagnosis not present

## 2023-03-22 DIAGNOSIS — I10 Essential (primary) hypertension: Secondary | ICD-10-CM

## 2023-03-22 DIAGNOSIS — R051 Acute cough: Secondary | ICD-10-CM | POA: Diagnosis not present

## 2023-03-22 DIAGNOSIS — N390 Urinary tract infection, site not specified: Secondary | ICD-10-CM | POA: Diagnosis not present

## 2023-03-22 DIAGNOSIS — M5441 Lumbago with sciatica, right side: Secondary | ICD-10-CM | POA: Diagnosis not present

## 2023-03-22 MED ORDER — SULFAMETHOXAZOLE-TRIMETHOPRIM 800-160 MG PO TABS: 1.0000 | ORAL_TABLET | Freq: Two times a day (BID) | ORAL | 0 refills | Status: AC

## 2023-03-22 NOTE — Patient Instructions (Signed)
 I have increased your gabapentin dose. I have referred to neurosurgery. I have sent in another antibiotic for your UTI. Let me know if you start having fevers, nausea, vomiting, or increasing back pain.

## 2023-03-23 MED ORDER — GABAPENTIN 300 MG PO CAPS: 300.0000 mg | ORAL_CAPSULE | ORAL | 1 refills | Status: DC

## 2023-03-23 MED ORDER — PANTOPRAZOLE SODIUM 40 MG PO TBEC: 40.0000 mg | DELAYED_RELEASE_TABLET | Freq: Every day | ORAL | 3 refills | Status: DC

## 2023-03-23 NOTE — Assessment & Plan Note (Addendum)
 BP lower today than at previous visits with recent medication adjustments in last 3 months. Doubt this is accurate value, though unfortunately not rechecked with elevated pulse. Reassured by exam today and ability to ambulate well. Follow up at next visit and consider altering medication regimen.

## 2023-03-23 NOTE — Assessment & Plan Note (Signed)
 UTI symptoms persistent after treatment with Macrobid .  Urine culture studies appear to be sensitive to nitrofurantoin ; however, will treat with 10 days of Bactrim .  If symptoms continue, repeat UA and urine culture.  Can also consider repeating renal ultrasound, though in 2023 results reassuringly normal.

## 2023-03-23 NOTE — Progress Notes (Signed)
    SUBJECTIVE:   CHIEF COMPLAINT / HPI:   UTI medications not working Previously seen on 12/17 with UTI symptoms.  Urine culture with greater than 100,000 colonies of E. coli.  She was treated with Macrobid .  Continues to have urinary frequency.  Chronic back pain due to L4-5 herniation Feels like this continues to worsen.  She is currently on gabapentin  which does not help that much.  She has tried Voltaren  gel, lidocaine  patches, analgesic sprays, muscle relaxers, physical therapy without much help.  She has seen sports medicine.  Has not seen neurosurgery about this.  Cough Has also had a cough over the last couple of days that is worse at night.  She has been out of her acid reflux medications.  PERTINENT  PMH / PSH: Stress-induced cardiomyopathy, migraines, hypertension, heart failure with reduced ejection fraction, esophagitis, OA of both knees, recurrent UTIs and pyelonephritis, physical deconditioning, panic attacks, GAD, bipolar 1 disorder, alcohol use disorder  OBJECTIVE:   BP 98/86   Pulse (!) 115   Ht 5' 4 (1.626 m)   Wt 164 lb (74.4 kg)   SpO2 100%   BMI 28.15 kg/m   General: Alert and oriented, in NAD Skin: Warm, dry, and intact without lesions HEENT: NCAT, EOM grossly normal, midline nasal septum Cardiac: RRR, no m/r/g appreciated Respiratory/Back: CTAB, breathing and speaking comfortably on RA, tenderness over costovertebral angles bilaterally a lot Extremities/MSK: Moves all extremities grossly equally, negative straight leg raise Neurological: Bilateral lower extremities 4/5 strength with baseline bilateral numbness of the lower extremities, ambulates without assistance Psychiatric: Appropriate mood and affect, PHQ-9 equals 16 with negative #9  ASSESSMENT/PLAN:   Chronic bilateral low back pain without sciatica Unfortunately pain is persistent after multiple conservative measures.  Reassuringly without new neurologic deficits today. Discussed referral to  neurosurgery for further evaluation.  In the meantime, will increase gabapentin  to 300 mg in the morning, 300 mg in the afternoon, and 900 mg at night for a total of 1500 mg daily (recommended max for current CrCl 1800 mg/day) for pain. Continue lidocaine  patches and tylenol .  Recurrent UTI UTI symptoms persistent after treatment with Macrobid .  Urine culture studies appear to be sensitive to nitrofurantoin ; however, will treat with 10 days of Bactrim .  If symptoms continue, repeat UA and urine culture.  Can also consider repeating renal ultrasound, though in 2023 results reassuringly normal.  Essential hypertension BP lower today than at previous visits with recent medication adjustments in last 3 months. Doubt this is accurate value, though unfortunately not rechecked with elevated pulse. Reassured by exam today and ability to ambulate well. Follow up at next visit and consider altering medication regimen.   Cough High suspicion for GERD given worsening symptoms at night while lying down while being out of her acid reflux medications.  Could also be superimposed viral process, though O2 saturation and exam reassuring.  Will refill acid reflux medications.  Follow-up as needed.  Health maintenance Follow-up care gaps at next visit with PCP.  Stuart Redo, MD [facility]

## 2023-03-23 NOTE — Assessment & Plan Note (Addendum)
 Unfortunately pain is persistent after multiple conservative measures.  Reassuringly without new neurologic deficits today. Discussed referral to neurosurgery for further evaluation.  In the meantime, will increase gabapentin  to 300 mg in the morning, 300 mg in the afternoon, and 900 mg at night for a total of 1500 mg daily (recommended max for current CrCl 1800 mg/day) for pain. Continue lidocaine  patches and tylenol .

## 2023-04-13 DIAGNOSIS — M129 Arthropathy, unspecified: Secondary | ICD-10-CM | POA: Diagnosis not present

## 2023-04-16 ENCOUNTER — Emergency Department (HOSPITAL_BASED_OUTPATIENT_CLINIC_OR_DEPARTMENT_OTHER): Payer: Medicaid Other | Admitting: Radiology

## 2023-04-16 ENCOUNTER — Other Ambulatory Visit: Payer: Self-pay

## 2023-04-16 ENCOUNTER — Emergency Department (HOSPITAL_BASED_OUTPATIENT_CLINIC_OR_DEPARTMENT_OTHER): Payer: Medicaid Other

## 2023-04-16 ENCOUNTER — Inpatient Hospital Stay (HOSPITAL_BASED_OUTPATIENT_CLINIC_OR_DEPARTMENT_OTHER)
Admission: EM | Admit: 2023-04-16 | Discharge: 2023-04-20 | DRG: 683 | Disposition: A | Discharge status: Home / Self Care | Payer: Medicaid Other | Attending: Family Medicine | Admitting: Family Medicine

## 2023-04-16 ENCOUNTER — Encounter (HOSPITAL_BASED_OUTPATIENT_CLINIC_OR_DEPARTMENT_OTHER): Payer: Self-pay | Admitting: *Deleted

## 2023-04-16 DIAGNOSIS — Z803 Family history of malignant neoplasm of breast: Secondary | ICD-10-CM | POA: Diagnosis not present

## 2023-04-16 DIAGNOSIS — E872 Acidosis, unspecified: Secondary | ICD-10-CM | POA: Diagnosis present

## 2023-04-16 DIAGNOSIS — Z5986 Financial insecurity: Secondary | ICD-10-CM | POA: Diagnosis not present

## 2023-04-16 DIAGNOSIS — Z800 Family history of malignant neoplasm of digestive organs: Secondary | ICD-10-CM

## 2023-04-16 DIAGNOSIS — K2100 Gastro-esophageal reflux disease with esophagitis, without bleeding: Secondary | ICD-10-CM | POA: Diagnosis not present

## 2023-04-16 DIAGNOSIS — R1013 Epigastric pain: Secondary | ICD-10-CM

## 2023-04-16 DIAGNOSIS — E871 Hypo-osmolality and hyponatremia: Secondary | ICD-10-CM | POA: Diagnosis present

## 2023-04-16 DIAGNOSIS — K200 Eosinophilic esophagitis: Secondary | ICD-10-CM | POA: Diagnosis present

## 2023-04-16 DIAGNOSIS — Z1152 Encounter for screening for COVID-19: Secondary | ICD-10-CM | POA: Diagnosis not present

## 2023-04-16 DIAGNOSIS — Z8249 Family history of ischemic heart disease and other diseases of the circulatory system: Secondary | ICD-10-CM

## 2023-04-16 DIAGNOSIS — D649 Anemia, unspecified: Secondary | ICD-10-CM | POA: Diagnosis present

## 2023-04-16 DIAGNOSIS — M1990 Unspecified osteoarthritis, unspecified site: Secondary | ICD-10-CM | POA: Diagnosis present

## 2023-04-16 DIAGNOSIS — Z91018 Allergy to other foods: Secondary | ICD-10-CM

## 2023-04-16 DIAGNOSIS — K219 Gastro-esophageal reflux disease without esophagitis: Secondary | ICD-10-CM | POA: Diagnosis present

## 2023-04-16 DIAGNOSIS — Z79899 Other long term (current) drug therapy: Secondary | ICD-10-CM

## 2023-04-16 DIAGNOSIS — R1115 Cyclical vomiting syndrome unrelated to migraine: Secondary | ICD-10-CM

## 2023-04-16 DIAGNOSIS — F17210 Nicotine dependence, cigarettes, uncomplicated: Secondary | ICD-10-CM | POA: Diagnosis present

## 2023-04-16 DIAGNOSIS — G8929 Other chronic pain: Secondary | ICD-10-CM | POA: Diagnosis present

## 2023-04-16 DIAGNOSIS — G4700 Insomnia, unspecified: Secondary | ICD-10-CM | POA: Diagnosis present

## 2023-04-16 DIAGNOSIS — I959 Hypotension, unspecified: Secondary | ICD-10-CM | POA: Diagnosis present

## 2023-04-16 DIAGNOSIS — D61818 Other pancytopenia: Secondary | ICD-10-CM | POA: Diagnosis present

## 2023-04-16 DIAGNOSIS — F319 Bipolar disorder, unspecified: Secondary | ICD-10-CM | POA: Diagnosis present

## 2023-04-16 DIAGNOSIS — E860 Dehydration: Secondary | ICD-10-CM | POA: Diagnosis present

## 2023-04-16 DIAGNOSIS — F419 Anxiety disorder, unspecified: Secondary | ICD-10-CM | POA: Diagnosis present

## 2023-04-16 DIAGNOSIS — I5022 Chronic systolic (congestive) heart failure: Secondary | ICD-10-CM | POA: Diagnosis present

## 2023-04-16 DIAGNOSIS — Z818 Family history of other mental and behavioral disorders: Secondary | ICD-10-CM | POA: Diagnosis not present

## 2023-04-16 DIAGNOSIS — R112 Nausea with vomiting, unspecified: Secondary | ICD-10-CM

## 2023-04-16 DIAGNOSIS — E861 Hypovolemia: Principal | ICD-10-CM | POA: Diagnosis present

## 2023-04-16 DIAGNOSIS — I428 Other cardiomyopathies: Secondary | ICD-10-CM | POA: Diagnosis present

## 2023-04-16 DIAGNOSIS — Z888 Allergy status to other drugs, medicaments and biological substances status: Secondary | ICD-10-CM | POA: Diagnosis not present

## 2023-04-16 DIAGNOSIS — Z5941 Food insecurity: Secondary | ICD-10-CM

## 2023-04-16 DIAGNOSIS — N189 Chronic kidney disease, unspecified: Secondary | ICD-10-CM | POA: Diagnosis not present

## 2023-04-16 DIAGNOSIS — I130 Hypertensive heart and chronic kidney disease with heart failure and stage 1 through stage 4 chronic kidney disease, or unspecified chronic kidney disease: Secondary | ICD-10-CM | POA: Diagnosis present

## 2023-04-16 DIAGNOSIS — R52 Pain, unspecified: Secondary | ICD-10-CM | POA: Diagnosis present

## 2023-04-16 DIAGNOSIS — N182 Chronic kidney disease, stage 2 (mild): Secondary | ICD-10-CM | POA: Diagnosis present

## 2023-04-16 DIAGNOSIS — N179 Acute kidney failure, unspecified: Secondary | ICD-10-CM | POA: Diagnosis present

## 2023-04-16 DIAGNOSIS — R578 Other shock: Secondary | ICD-10-CM | POA: Diagnosis not present

## 2023-04-16 DIAGNOSIS — A419 Sepsis, unspecified organism: Secondary | ICD-10-CM | POA: Diagnosis present

## 2023-04-16 DIAGNOSIS — K3189 Other diseases of stomach and duodenum: Secondary | ICD-10-CM | POA: Diagnosis not present

## 2023-04-16 LAB — COMPREHENSIVE METABOLIC PANEL
ALT: 12 U/L (ref 0–44)
AST: 63 U/L — ABNORMAL HIGH (ref 15–41)
Albumin: 3.7 g/dL (ref 3.5–5.0)
Alkaline Phosphatase: 102 U/L (ref 38–126)
Anion gap: 15 (ref 5–15)
BUN: 40 mg/dL — ABNORMAL HIGH (ref 6–20)
CO2: 21 mmol/L — ABNORMAL LOW (ref 22–32)
Calcium: 8.3 mg/dL — ABNORMAL LOW (ref 8.9–10.3)
Chloride: 90 mmol/L — ABNORMAL LOW (ref 98–111)
Creatinine, Ser: 3.85 mg/dL — ABNORMAL HIGH (ref 0.44–1.00)
GFR, Estimated: 14 mL/min — ABNORMAL LOW (ref >60)
Glucose, Bld: 95 mg/dL (ref 70–99)
Potassium: 4.5 mmol/L (ref 3.5–5.1)
Sodium: 126 mmol/L — ABNORMAL LOW (ref 135–145)
Total Bilirubin: 0.6 mg/dL (ref 0.0–1.2)
Total Protein: 6.1 g/dL — ABNORMAL LOW (ref 6.5–8.1)

## 2023-04-16 LAB — CBC
HCT: 27.1 % — ABNORMAL LOW (ref 36.0–46.0)
Hemoglobin: 9.4 g/dL — ABNORMAL LOW (ref 12.0–15.0)
MCH: 31.6 pg (ref 26.0–34.0)
MCHC: 34.7 g/dL (ref 30.0–36.0)
MCV: 91.2 fL (ref 80.0–100.0)
Platelets: 127 10*3/uL — ABNORMAL LOW (ref 150–400)
RBC: 2.97 MIL/uL — ABNORMAL LOW (ref 3.87–5.11)
RDW: 13.4 % (ref 11.5–15.5)
WBC: 3.6 10*3/uL — ABNORMAL LOW (ref 4.0–10.5)
nRBC: 0 % (ref 0.0–0.2)

## 2023-04-16 LAB — URINALYSIS, W/ REFLEX TO CULTURE (INFECTION SUSPECTED)
Bilirubin Urine: NEGATIVE
Glucose, UA: NEGATIVE mg/dL
Hgb urine dipstick: NEGATIVE
Ketones, ur: NEGATIVE mg/dL
Nitrite: NEGATIVE
Protein, ur: NEGATIVE mg/dL
Specific Gravity, Urine: 1.013 (ref 1.005–1.030)
pH: 5.5 (ref 5.0–8.0)

## 2023-04-16 LAB — CBC WITH DIFFERENTIAL/PLATELET
Abs Immature Granulocytes: 0.02 10*3/uL (ref 0.00–0.07)
Basophils Absolute: 0.1 10*3/uL (ref 0.0–0.1)
Basophils Relative: 2 %
Eosinophils Absolute: 0.2 10*3/uL (ref 0.0–0.5)
Eosinophils Relative: 5 %
HCT: 32.2 % — ABNORMAL LOW (ref 36.0–46.0)
Hemoglobin: 11.1 g/dL — ABNORMAL LOW (ref 12.0–15.0)
Immature Granulocytes: 1 %
Lymphocytes Relative: 29 %
Lymphs Abs: 1.2 10*3/uL (ref 0.7–4.0)
MCH: 31.4 pg (ref 26.0–34.0)
MCHC: 34.5 g/dL (ref 30.0–36.0)
MCV: 91 fL (ref 80.0–100.0)
Monocytes Absolute: 0.7 10*3/uL (ref 0.1–1.0)
Monocytes Relative: 16 %
Neutro Abs: 2.1 10*3/uL (ref 1.7–7.7)
Neutrophils Relative %: 47 %
Platelets: 156 10*3/uL (ref 150–400)
RBC: 3.54 MIL/uL — ABNORMAL LOW (ref 3.87–5.11)
RDW: 13.2 % (ref 11.5–15.5)
WBC: 4.2 10*3/uL (ref 4.0–10.5)
nRBC: 0 % (ref 0.0–0.2)

## 2023-04-16 LAB — LACTIC ACID, PLASMA
Lactic Acid, Venous: 3.8 mmol/L (ref 0.5–1.9)
Lactic Acid, Venous: 3.4 mmol/L (ref 0.5–1.9)
Lactic Acid, Venous: 3 mmol/L (ref 0.5–1.9)

## 2023-04-16 LAB — GLUCOSE, CAPILLARY: Glucose-Capillary: 106 mg/dL — ABNORMAL HIGH (ref 70–99)

## 2023-04-16 LAB — PREGNANCY, URINE: Preg Test, Ur: NEGATIVE

## 2023-04-16 LAB — RESP PANEL BY RT-PCR (RSV, FLU A&B, COVID)  RVPGX2
Influenza A by PCR: NEGATIVE
Influenza B by PCR: NEGATIVE
Resp Syncytial Virus by PCR: NEGATIVE
SARS Coronavirus 2 by RT PCR: NEGATIVE

## 2023-04-16 LAB — TROPONIN I (HIGH SENSITIVITY): Troponin I (High Sensitivity): 3 ng/L (ref <18)

## 2023-04-16 MED ORDER — SODIUM CHLORIDE 0.9 % IV SOLN
2.0000 g | Freq: Once | INTRAVENOUS | Status: AC
  Administered 2023-04-16: 2 g via INTRAVENOUS
  Filled 2023-04-16: qty 20

## 2023-04-16 MED ORDER — POLYETHYLENE GLYCOL 3350 17 G PO PACK
17.0000 g | PACK | Freq: Every day | ORAL | Status: DC | PRN
Start: 2023-04-16 — End: 2023-04-20

## 2023-04-16 MED ORDER — ONDANSETRON HCL 4 MG/2ML IJ SOLN
4.0000 mg | Freq: Four times a day (QID) | INTRAMUSCULAR | Status: DC | PRN
  Administered 2023-04-16: 4 mg via INTRAVENOUS
  Filled 2023-04-16: qty 2

## 2023-04-16 MED ORDER — SODIUM CHLORIDE 0.9 % IV BOLUS
1000.0000 mL | Freq: Once | INTRAVENOUS | Status: AC
  Administered 2023-04-16: 1000 mL via INTRAVENOUS

## 2023-04-16 MED ORDER — SODIUM CHLORIDE 0.9 % IV SOLN: INTRAVENOUS | Status: DC

## 2023-04-16 MED ORDER — ACETAMINOPHEN 325 MG PO TABS
650.0000 mg | ORAL_TABLET | ORAL | Status: DC | PRN
  Administered 2023-04-17 – 2023-04-19 (×3): 650 mg via ORAL
  Filled 2023-04-16 (×3): qty 2

## 2023-04-16 MED ORDER — CHLORHEXIDINE GLUCONATE CLOTH 2 % EX PADS
6.0000 | MEDICATED_PAD | Freq: Every day | CUTANEOUS | Status: DC
  Administered 2023-04-16 – 2023-04-20 (×3): 6 via TOPICAL

## 2023-04-16 MED ORDER — DIAZEPAM 5 MG PO TABS
5.0000 mg | ORAL_TABLET | Freq: Once | ORAL | Status: AC
  Administered 2023-04-16: 5 mg via ORAL
  Filled 2023-04-16: qty 1

## 2023-04-16 MED ORDER — INSULIN ASPART 100 UNIT/ML IJ SOLN: 0.0000 [IU] | INTRAMUSCULAR | Status: DC

## 2023-04-16 MED ORDER — SODIUM CHLORIDE 0.9 % IV BOLUS (SEPSIS)
250.0000 mL | Freq: Once | INTRAVENOUS | Status: AC
Start: 2023-04-16 — End: 2023-04-16
  Administered 2023-04-16: 250 mL via INTRAVENOUS

## 2023-04-16 MED ORDER — ORAL CARE MOUTH RINSE: 15.0000 mL | OROMUCOSAL | Status: DC | PRN

## 2023-04-16 MED ORDER — METRONIDAZOLE 500 MG/100ML IV SOLN
500.0000 mg | Freq: Once | INTRAVENOUS | Status: AC
  Administered 2023-04-16: 500 mg via INTRAVENOUS
  Filled 2023-04-16: qty 100

## 2023-04-16 MED ORDER — HEPARIN SODIUM (PORCINE) 5000 UNIT/ML IJ SOLN
5000.0000 [IU] | Freq: Three times a day (TID) | INTRAMUSCULAR | Status: AC
  Filled 2023-04-16 (×3): qty 1

## 2023-04-16 MED ORDER — PANTOPRAZOLE SODIUM 40 MG PO TBEC
40.0000 mg | DELAYED_RELEASE_TABLET | Freq: Every day | ORAL | Status: DC
  Administered 2023-04-17 – 2023-04-18 (×2): 40 mg via ORAL
  Filled 2023-04-16 (×2): qty 1

## 2023-04-16 MED ORDER — DOCUSATE SODIUM 100 MG PO CAPS: 100.0000 mg | ORAL_CAPSULE | Freq: Two times a day (BID) | ORAL | Status: DC | PRN

## 2023-04-16 NOTE — ED Notes (Signed)
Patient refused for blood pressure to be placed back on arm, states "I took it off, I'm comfortable and I'm hungry"

## 2023-04-16 NOTE — ED Notes (Signed)
 Patient to CT.

## 2023-04-16 NOTE — ED Provider Notes (Signed)
[facility] AT [facility] Provider Note   CSN: [id number] Arrival date & time: 04/16/23  1659     History  Chief Complaint  Patient presents with   Generalized Body Aches    Kristin [name] is a 48 y.o. female.  48 yo F with a chief complaints of feeling a bit dizzy.  She tells me that she has not really having to eat or drink in 3 days.  She has been having nausea vomiting and diarrhea.  Achy all over.  Feeling a bit fatigued.  Denies dark or bloody stool.  Has chronic abdominal pain that is unchanged.  She felt she had some trouble breathing earlier today which seems to have gotten better.        Home Medications Prior to Admission medications   Medication Sig Start Date End Date Taking? Authorizing Provider  acetaminophen (TYLENOL) 500 MG tablet Take 1 tablet (500 mg total) by mouth every 8 (eight) hours as needed for mild pain. Patient not taking: Reported on 11/02/2022 07/04/19   Marthenia Rolling, DO  benzonatate (TESSALON PERLES) 100 MG capsule Take 1 capsule (100 mg total) by mouth 3 (three) times daily as needed for cough. 12/28/22   Jerre Simon, MD  dicyclomine (BENTYL) 10 MG capsule Take by mouth. Patient not taking: Reported on 11/02/2022 12/07/21   [provider]  famotidine (PEPCID) 20 MG tablet Take 1 tablet (20 mg total) by mouth 2 (two) times daily. 02/01/22 06/01/22  Jerre Simon, MD  gabapentin (NEURONTIN) 300 MG capsule Take 1 capsule (300 mg total) by mouth as directed. Take 1 capsule in the morning, 1 capsule in the afternoon, and 3 capsules at night. 03/23/23   Evette Georges, MD  hydrOXYzine (ATARAX) 10 MG tablet Take 1 tablet (10 mg total) by mouth 3 (three) times daily as needed for anxiety or itching (insomnia). Patient not taking: Reported on 11/02/2022 05/09/22   Littie Deeds, MD  hydrOXYzine (ATARAX) 50 MG tablet Take 1-2 tablets (50-100 mg total) by mouth at bedtime as needed. Patient not taking: Reported on 11/02/2022  06/27/22   Valetta Close, MD  ibuprofen (ADVIL) 600 MG tablet Take 1 tablet (600 mg total) by mouth every 6 (six) hours as needed. 09/18/22   Peter Garter, PA  lisinopril-hydrochlorothiazide (ZESTORETIC) 10-12.5 MG tablet Take 1 tablet by mouth daily. 12/28/22   Jerre Simon, MD  LORazepam (ATIVAN) 0.5 MG tablet Take 0.5 mg by mouth every 8 (eight) hours. 03/23/22   [provider]  methocarbamol (ROBAXIN) 500 MG tablet Take 1 tablet (500 mg total) by mouth 2 (two) times daily. 11/02/22   Baloch, Mahnoor, MD  naltrexone (DEPADE) 50 MG tablet Take 50 mg by mouth daily. Patient not taking: Reported on 01/06/2022 12/29/21   [provider]  nicotine (NICODERM CQ - DOSED IN MG/24 HOURS) 14 mg/24hr patch Place 1 patch (14 mg total) onto the skin daily. 01/13/22   Lance Muss, MD  pantoprazole (PROTONIX) 40 MG tablet Take 1 tablet (40 mg total) by mouth daily. 03/23/23 04/22/23  Evette Georges, MD  traZODone (DESYREL) 50 MG tablet Take 1 tablet (50 mg total) by mouth at bedtime as needed for sleep. 02/27/22   Jerre Simon, MD  triamcinolone cream (KENALOG) 0.1 % Apply 1 Application topically 2 (two) times daily. DO NOT USE ON FACE 08/01/22   Shelby Mattocks, DO  fluticasone (FLONASE) 50 MCG/ACT nasal spray Place 2 sprays into both nostrils daily. Patient taking differently: Place 2  sprays into both nostrils daily as needed for allergies.  02/25/17 04/14/19  Belinda Fisher, PA-C  promethazine (PHENERGAN) 25 MG tablet Take 1 tablet (25 mg total) by mouth every 6 (six) hours as needed for nausea or vomiting. 04/17/18 08/29/18  Jacalyn Lefevre, MD      Allergies    Sumatriptan and Other    Review of Systems   Review of Systems  Physical Exam Updated Vital Signs BP 117/68 (BP Location: Right Arm)   Pulse 86   Temp 98.3 F (36.8 C) (Oral)   Resp 17   SpO2 100%  Physical Exam Vitals and nursing note reviewed.  Constitutional:      General: She is not in acute distress.    Appearance:  She is well-developed. She is not diaphoretic.  HENT:     Head: Normocephalic and atraumatic.  Eyes:     Pupils: Pupils are equal, round, and reactive to light.  Cardiovascular:     Rate and Rhythm: Normal rate and regular rhythm.     Heart sounds: No murmur heard.    No friction rub. No gallop.  Pulmonary:     Effort: Pulmonary effort is normal.     Breath sounds: No wheezing or rales.  Abdominal:     General: There is no distension.     Palpations: Abdomen is soft.     Tenderness: There is no abdominal tenderness.  Musculoskeletal:        General: No tenderness.     Cervical back: Normal range of motion and neck supple.  Skin:    General: Skin is warm and dry.  Neurological:     Mental Status: She is alert and oriented to person, place, and time.  Psychiatric:        Behavior: Behavior normal.     ED Results / Procedures / Treatments   Labs (all labs ordered are listed, but only abnormal results are displayed) Labs Reviewed  LACTIC ACID, PLASMA - Abnormal; Notable for the following components:      Result Value   Lactic Acid, Venous 3.8 (*)    All other components within normal limits  LACTIC ACID, PLASMA - Abnormal; Notable for the following components:   Lactic Acid, Venous 3.4 (*)    All other components within normal limits  COMPREHENSIVE METABOLIC PANEL - Abnormal; Notable for the following components:   Sodium 126 (*)    Chloride 90 (*)    CO2 21 (*)    BUN 40 (*)    Creatinine, Ser 3.85 (*)    Calcium 8.3 (*)    Total Protein 6.1 (*)    AST 63 (*)    GFR, Estimated 14 (*)    All other components within normal limits  CBC WITH DIFFERENTIAL/PLATELET - Abnormal; Notable for the following components:   RBC 3.54 (*)    Hemoglobin 11.1 (*)    HCT 32.2 (*)    All other components within normal limits  URINALYSIS, W/ REFLEX TO CULTURE (INFECTION SUSPECTED) - Abnormal; Notable for the following components:   Leukocytes,Ua MODERATE (*)    Bacteria, UA RARE (*)     All other components within normal limits  LACTIC ACID, PLASMA - Abnormal; Notable for the following components:   Lactic Acid, Venous 3.0 (*)    All other components within normal limits  GLUCOSE, CAPILLARY - Abnormal; Notable for the following components:   Glucose-Capillary 106 (*)    All other components within normal limits  RESP PANEL BY  RT-PCR (RSV, FLU A&B, COVID)  [id number]  URINE CULTURE  CULTURE, BLOOD (SINGLE)  MRSA NEXT GEN BY PCR, NASAL  GASTROINTESTINAL PANEL BY PCR, STOOL (REPLACES STOOL CULTURE)  C DIFFICILE QUICK SCREEN W PCR REFLEX    PREGNANCY, URINE  CBC  CREATININE, SERUM  CBC  BASIC METABOLIC PANEL  PHOSPHORUS  MAGNESIUM  HEMOGLOBIN A1C  LACTIC ACID, PLASMA  TROPONIN I (HIGH SENSITIVITY)    EKG None  Radiology CT ABDOMEN PELVIS WO CONTRAST Result Date: 04/16/2023 CLINICAL DATA:  Sepsis. Body aches, fatigue, generalized weakness and diarrhea for 2 days. EXAM: CT ABDOMEN AND PELVIS WITHOUT CONTRAST TECHNIQUE: Multidetector CT imaging of the abdomen and pelvis was performed following the standard protocol without IV contrast. RADIATION DOSE REDUCTION: This exam was performed according to the departmental dose-optimization program which includes automated exposure control, adjustment of the mA and/or kV according to patient size and/or use of iterative reconstruction technique. COMPARISON:  01/11/2022 FINDINGS: Lower chest: The lung bases are clear of acute process. No pleural effusion or pulmonary lesions. The heart is normal in size. No pericardial effusion. The distal esophagus and aorta are unremarkable. Hepatobiliary: No focal liver abnormality is seen. No gallstones, gallbladder wall thickening, or biliary dilatation. Pancreas: Unremarkable. No pancreatic ductal dilatation or surrounding inflammatory changes. Spleen: Normal in size without focal abnormality. Adrenals/Urinary Tract: The adrenal glands and kidneys are unremarkable. No renal or obstructing ureteral  calculi. No bladder calculi or bladder mass. Stomach/Bowel: The stomach, duodenum, small bowel and colon are grossly normal without oral contrast. No inflammatory changes, mass lesions or obstructive findings. The appendix is normal. Vascular/Lymphatic: The aorta is normal in caliber. Minimal scattered atheroscerlotic calcifications. No mesenteric of retroperitoneal mass or adenopathy. Small scattered lymph nodes are noted. Reproductive: The uterus and ovaries are unremarkable. Other: Stable periumbilical and supraumbilical abdominal wall hernias containing fat. Musculoskeletal: No significant bony findings. IMPRESSION: 1. No acute abdominal/pelvic findings, mass lesions or adenopathy. 2. No renal or obstructing ureteral calculi. 3. Stable periumbilical and supraumbilical abdominal wall hernias containing fat. Electronically Signed   By: Rudie Meyer M.D.   On: 04/16/2023 20:47   DG Chest 2 View Result Date: 04/16/2023 CLINICAL DATA:  Hypotension.  Body aches. EXAM: CHEST - 2 VIEW COMPARISON:  Radiograph 01/10/2022, chest CT 01/06/2022 FINDINGS: The cardiomediastinal contours are normal. Vague ill-defined right lung base opacity. Pulmonary vasculature is normal. No pleural effusion or pneumothorax. No acute osseous abnormalities are seen. IMPRESSION: Vague ill-defined right lung base opacity may represent atelectasis/scarring or infection. Electronically Signed   By: Narda Rutherford M.D.   On: 04/16/2023 18:31    Procedures .Critical Care  Performed by: Melene Plan, DO Authorized by: Melene Plan, DO   Critical care provider statement:    Critical care time (minutes):  35   Critical care time was exclusive of:  Separately billable procedures and treating other patients   Critical care was time spent personally by me on the following activities:  Development of treatment plan with patient or surrogate, discussions with consultants, evaluation of patient's response to treatment, examination of patient,  ordering and review of laboratory studies, ordering and review of radiographic studies, ordering and performing treatments and interventions, pulse oximetry, re-evaluation of patient's condition and review of old charts   Care discussed with: admitting provider       Medications Ordered in ED Medications  0.9 %  sodium chloride infusion ( Intravenous New Bag/Given 04/16/23 2302)  Chlorhexidine Gluconate Cloth 2 % PADS 6 each (6 each Topical Given 04/16/23  2256)  Oral care mouth rinse (has no administration in time range)  docusate sodium (COLACE) capsule 100 mg (has no administration in time range)  polyethylene glycol (MIRALAX / GLYCOLAX) packet 17 g (has no administration in time range)  heparin injection 5,000 Units (has no administration in time range)  acetaminophen (TYLENOL) tablet 650 mg (has no administration in time range)  ondansetron (ZOFRAN) injection 4 mg (has no administration in time range)  insulin aspart (novoLOG) injection 0-9 Units ( Subcutaneous Not Given 04/16/23 2306)  sodium chloride 0.9 % bolus 1,000 mL (0 mLs Intravenous Stopped 04/16/23 2002)  sodium chloride 0.9 % bolus 1,000 mL (0 mLs Intravenous Stopped 04/16/23 2153)  sodium chloride 0.9 % bolus 250 mL (250 mLs Intravenous New Bag/Given 04/16/23 2154)  cefTRIAXone (ROCEPHIN) 2 g in sodium chloride 0.9 % 100 mL IVPB (0 g Intravenous Stopped 04/16/23 2036)  metroNIDAZOLE (FLAGYL) IVPB 500 mg (0 mg Intravenous Stopped 04/16/23 2153)  diazepam (VALIUM) tablet 5 mg (5 mg Oral Given 04/16/23 1955)    ED Course/ Medical Decision Making/ A&P                                 Medical Decision Making Amount and/or Complexity of Data Reviewed Labs: ordered. Radiology: ordered.  Risk Prescription drug management. Decision regarding hospitalization.   48 yo F with a chief complaints of not feeling well noting drinking for few days.  The patient is very hypertensive on initial vital signs.  She was able to stand up and walk to her  bed which I do not think is consistent with the blood pressure number that was obtained.  She also looks quite well to me.  Will give a bolus of IV fluids.  Laboratory evaluation.  COVID flu and RSV.  Chest x-ray.  Reassess.  COVID flu and RSV are negative.  UA without obvious infection.  Lactate initially was 3.7, repeated 3.4.  Blood pressures remain a bit on the soft side.  I discussed the case with the hospitalist who is very reluctant to take the patient on the floor.  Concerned with history of cardiomyopathy.  Recommended discussion with ICU.  Discussed with ICU who accept the patient.  The patients results and plan were reviewed and discussed.   Any x-rays performed were independently reviewed by myself.   Differential diagnosis were considered with the presenting HPI.  Medications  0.9 %  sodium chloride infusion ( Intravenous New Bag/Given 04/16/23 2302)  Chlorhexidine Gluconate Cloth 2 % PADS 6 each (6 each Topical Given 04/16/23 2256)  Oral care mouth rinse (has no administration in time range)  docusate sodium (COLACE) capsule 100 mg (has no administration in time range)  polyethylene glycol (MIRALAX / GLYCOLAX) packet 17 g (has no administration in time range)  heparin injection 5,000 Units (has no administration in time range)  acetaminophen (TYLENOL) tablet 650 mg (has no administration in time range)  ondansetron (ZOFRAN) injection 4 mg (has no administration in time range)  insulin aspart (novoLOG) injection 0-9 Units ( Subcutaneous Not Given 04/16/23 2306)  sodium chloride 0.9 % bolus 1,000 mL (0 mLs Intravenous Stopped 04/16/23 2002)  sodium chloride 0.9 % bolus 1,000 mL (0 mLs Intravenous Stopped 04/16/23 2153)  sodium chloride 0.9 % bolus 250 mL (250 mLs Intravenous New Bag/Given 04/16/23 2154)  cefTRIAXone (ROCEPHIN) 2 g in sodium chloride 0.9 % 100 mL IVPB (0 g Intravenous Stopped 04/16/23 2036)  metroNIDAZOLE (FLAGYL)  IVPB 500 mg (0 mg Intravenous Stopped 04/16/23 2153)  diazepam  (VALIUM) tablet 5 mg (5 mg Oral Given 04/16/23 1955)    Vitals:   04/16/23 2159 04/16/23 2213 04/16/23 2216 04/16/23 2307  BP: 92/63 (!) 83/57 (!) 88/60 117/68  Pulse: 91 94 92 86  Resp: 18 18 17    Temp: 98.4 F (36.9 C)   98.3 F (36.8 C)  TempSrc: Oral   Oral  SpO2: 100% 100% 100%     Final diagnoses:  Hypovolemia    Admission/ observation were discussed with the admitting physician, patient and/or family and they are comfortable with the plan.          Final Clinical Impression(s) / ED Diagnoses Final diagnoses:  Hypovolemia    Rx / DC Orders ED Discharge Orders     None         Melene Plan, DO 04/16/23 2314

## 2023-04-16 NOTE — ED Notes (Signed)
 Patient transported to X-ray

## 2023-04-16 NOTE — ED Notes (Signed)
 Patient back from CT.

## 2023-04-16 NOTE — Progress Notes (Signed)
 Elink monitoring for the code sepsis protocol.

## 2023-04-16 NOTE — ED Notes (Signed)
Patient refuses for me to look for another IV.  Md made aware

## 2023-04-16 NOTE — H&P (Signed)
NAME:  JADEN BATCHELDER, MRN:  [id number], DOB:  Jan 29, 1976, LOS: 0 ADMISSION DATE:  04/16/2023, CONSULTATION DATE:  2/3 REFERRING MD:  Floyd-EDP, CHIEF COMPLAINT:  hypotension   History of Present Illness:  Ms. Lopata is a 48 y/o woman with a history of HTN, bipolar disorder, previous Takotsubo cardiomyopathy who presented with lightheadedness, dizziness, and feeling dehydrated. She has chronic reflux and frequent vomiting, so her PO intake is minimal. Famotidine and Tums have not helped this chronically. She has significant weigh fluctuations due to her variable PO intake. She did not take her lisinopril today due to getting too busy at work; last dose on 2/2. She denies diarrhea. In the ED she was volume resuscitated but continued to have low Bps. Per EDP she was able to ambulate and did not appear altered.  Due to low Bps in the ED PCCM was consulted for admission after TRH turned the patient down for floor admission.  Pertinent  Medical History  HFrEF due to NICM (takotsubo) Bipolar disorder HTN GERD  Significant Hospital Events: Including procedures, antibiotic start and stop dates in addition to other pertinent events   2/3 admission  Interim History / Subjective:    Objective   Blood pressure (!) 88/60, pulse 92, temperature 98.4 F (36.9 C), temperature source Oral, resp. rate 17, SpO2 100%.        Intake/Output Summary (Last 24 hours) at 04/16/2023 2301 Last data filed at 04/16/2023 2153 Gross per 24 hour  Intake 2200 ml  Output --  Net 2200 ml   There were no vitals filed for this visit.  Examination: General: middle aged woman sitting up in bed in NAD HENT: [place]/AT, eyes anicteric Lungs: breathing comfortably on RA, CTAB Cardiovascular: S1S2, RRR Abdomen: soft, mildly TTP Extremities: no edema or cyanosis Neuro: awake, alert, moving all extremities, answering questions appropriately. Derm: warm, dry, no rashes  LA 3.8>3.4>3 Na+ 126   Resolved Hospital Problem list      Assessment & Plan:  Hyponatremia, hyponatremia -saline -BMP q6h for sodium monitoring  Hypotension; H/o HTN -hold PTA lisinopril -volume resusvitate  AKI on CKD 2 -strict I/O -volume resuscitate -renally dose meds, avoid nephrotoxic meds  H/o takotsubo CM -follow up echo  GERD, poorly controlled -start PPI -needs to establish care with GI as OP  Anemia; suspect nutritional deficiencies due to poor PO intake -transfuse for Hb <7 or hemodynamically significant bleeding -monitor  Questionable history of diarrhea -if present, check GI panel and C diff  Best Practice (right click and "Reselect all SmartList Selections" daily)   Diet/type: Regular consistency (see orders) DVT prophylaxis prophylactic heparin  Pressure ulcer(s): N/A GI prophylaxis: PPI Lines: N/A Foley:  N/A Code Status:  full code Last date of multidisciplinary goals of care discussion [ ]   Labs   CBC: Recent Labs  Lab 04/16/23 1735  WBC 4.2  NEUTROABS 2.1  HGB 11.1*  HCT 32.2*  MCV 91.0  PLT 156    Basic Metabolic Panel: Recent Labs  Lab 04/16/23 1735  NA 126*  K 4.5  CL 90*  CO2 21*  GLUCOSE 95  BUN 40*  CREATININE 3.85*  CALCIUM 8.3*   GFR: CrCl cannot be calculated (Unknown ideal weight.). Recent Labs  Lab 04/16/23 1735 04/16/23 1947 04/16/23 2155  WBC 4.2  --   --   LATICACIDVEN 3.8* 3.4* 3.0*    Liver Function Tests: Recent Labs  Lab 04/16/23 1735  AST 63*  ALT 12  ALKPHOS 102  BILITOT 0.6  PROT 6.1*  ALBUMIN 3.7   No results for input(s): "LIPASE", "AMYLASE" in the last 168 hours. No results for input(s): "AMMONIA" in the last 168 hours.  ABG    Component Value Date/Time   PHART 7.415 01/09/2022 0818   PCO2ART 34.2 01/09/2022 0818   PO2ART 60 (L) 01/09/2022 0818   HCO3 23.6 01/09/2022 0821   TCO2 25 01/09/2022 0821   ACIDBASEDEF 1.0 01/09/2022 0821   O2SAT 53 01/09/2022 0821     Coagulation Profile: No results for input(s): "INR", "PROTIME"  in the last 168 hours.  Cardiac Enzymes: No results for input(s): "CKTOTAL", "CKMB", "CKMBINDEX", "TROPONINI" in the last 168 hours.  HbA1C: Hgb A1c MFr Bld  Date/Time Value Ref Range Status  06/27/2022 04:41 PM 4.8 4.8 - 5.6 % Final    Comment:             Prediabetes: 5.7 - 6.4          Diabetes: >6.4          Glycemic control for adults with diabetes: <7.0   10/11/2019 05:36 AM 5.8 (H) 4.8 - 5.6 % Final    Comment:    (NOTE) Pre diabetes:          5.7%-6.4%  Diabetes:              >6.4%  Glycemic control for   <7.0% adults with diabetes     CBG: No results for input(s): "GLUCAP" in the last 168 hours.  Review of Systems:   Review of Systems  Constitutional:  Positive for malaise/fatigue. Negative for chills and fever.  HENT: Negative.    Eyes: Negative.   Respiratory:  Negative for cough.   Cardiovascular:  Positive for orthopnea.  Gastrointestinal:  Positive for abdominal pain, heartburn and vomiting.  Musculoskeletal:  Positive for myalgias. Negative for joint pain.  Skin:  Negative for rash.     Past Medical History:  She,  has a past medical history of Acid reflux, ACL tear (2011), Acute lateral meniscus tear of right knee, Anxiety, Arthritis, Back pain, Bipolar 1 disorder (HCC), Depression, Dysfunctional uterine bleeding, Eczema, Hypertension, Insomnia, Migraine headache, Neuromuscular disorder (HCC), Osteoarthritis of right knee (12/21/2014), Respiratory distress (01/12/2022), and Sepsis with acute hypoxic respiratory failure without septic shock, due to unspecified organism (HCC) (01/06/2022).   Surgical History:   Past Surgical History:  Procedure Laterality Date   BIOPSY  09/19/2017   Procedure: BIOPSY;  Surgeon: Kathi Der, MD;  Location: [facility];  Service: Gastroenterology;;   BIOPSY  10/15/2019   Procedure: BIOPSY;  Surgeon: Charlott Rakes, MD;  Location: [facility];  Service: Endoscopy;;   COLONOSCOPY N/A 10/15/2019   Procedure:  COLONOSCOPY;  Surgeon: Charlott Rakes, MD;  Location: [facility];  Service: Endoscopy;  Laterality: N/A;   DILITATION & CURRETTAGE/HYSTROSCOPY WITH HYDROTHERMAL ABLATION N/A 04/04/2017   Procedure: DILATATION & CURETTAGE/HYSTEROSCOPY WITH HYDROTHERMAL ABLATION;  Surgeon: Reva Bores, MD;  Location: [facility];  Service: Gynecology;  Laterality: N/A;   ESOPHAGOGASTRODUODENOSCOPY N/A 10/15/2019   Procedure: ESOPHAGOGASTRODUODENOSCOPY (EGD);  Surgeon: Charlott Rakes, MD;  Location: [facility];  Service: Endoscopy;  Laterality: N/A;   ESOPHAGOGASTRODUODENOSCOPY (EGD) WITH PROPOFOL N/A 09/19/2017   Procedure: ESOPHAGOGASTRODUODENOSCOPY (EGD) WITH PROPOFOL;  Surgeon: Kathi Der, MD;  [facility];  Service: Gastroenterology;  Laterality: N/A;   KNEE ARTHROSCOPY WITH LATERAL MENISECTOMY Right 09/30/2019   Procedure: KNEE ARTHROSCOPY WITH LATERAL MENISECTOMY;  Surgeon: Sheral Apley, MD;  Location[facility];  Service: Orthopedics;  Laterality: Right;   RIGHT/LEFT HEART CATH AND CORONARY ANGIOGRAPHY N/A 01/09/2022   Procedure: RIGHT/LEFT HEART CATH AND CORONARY ANGIOGRAPHY;  Surgeon: Elder Negus, MD;  [facility];  Service: Cardiovascular;  Laterality: N/A;   TUBAL LIGATION  yrs ago     Social History:   reports that she has been smoking cigarettes. She has a 1.5 pack-year smoking history. She has been exposed to tobacco smoke. She has never used smokeless tobacco. She reports current alcohol use. She reports that she does not use drugs.   Family History:  Her family history includes Bipolar disorder in her cousin; Breast cancer in her maternal grandmother; Colon cancer in her maternal uncle; Hypertension in her brother and mother; Schizophrenia in her cousin.   Allergies Allergies  Allergen Reactions   Sumatriptan Anaphylaxis    High blood pressure and numb   Other     Bananas-stomach cramps     Home  Medications  Prior to Admission medications   Medication Sig Start Date End Date Taking? Authorizing Provider  acetaminophen (TYLENOL) 500 MG tablet Take 1 tablet (500 mg total) by mouth every 8 (eight) hours as needed for mild pain. Patient not taking: Reported on 11/02/2022 07/04/19   Marthenia Rolling, DO  benzonatate (TESSALON PERLES) 100 MG capsule Take 1 capsule (100 mg total) by mouth 3 (three) times daily as needed for cough. 12/28/22   Jerre Simon, MD  dicyclomine (BENTYL) 10 MG capsule Take by mouth. Patient not taking: Reported on 11/02/2022 12/07/21   [provider]  famotidine (PEPCID) 20 MG tablet Take 1 tablet (20 mg total) by mouth 2 (two) times daily. 02/01/22 06/01/22  Jerre Simon, MD  gabapentin (NEURONTIN) 300 MG capsule Take 1 capsule (300 mg total) by mouth as directed. Take 1 capsule in the morning, 1 capsule in the afternoon, and 3 capsules at night. 03/23/23   Evette Georges, MD  hydrOXYzine (ATARAX) 10 MG tablet Take 1 tablet (10 mg total) by mouth 3 (three) times daily as needed for anxiety or itching (insomnia). Patient not taking: Reported on 11/02/2022 05/09/22   Littie Deeds, MD  hydrOXYzine (ATARAX) 50 MG tablet Take 1-2 tablets (50-100 mg total) by mouth at bedtime as needed. Patient not taking: Reported on 11/02/2022 06/27/22   Valetta Close, MD  ibuprofen (ADVIL) 600 MG tablet Take 1 tablet (600 mg total) by mouth every 6 (six) hours as needed. 09/18/22   Peter Garter, PA  lisinopril-hydrochlorothiazide (ZESTORETIC) 10-12.5 MG tablet Take 1 tablet by mouth daily. 12/28/22   Jerre Simon, MD  LORazepam (ATIVAN) 0.5 MG tablet Take 0.5 mg by mouth every 8 (eight) hours. 03/23/22   [provider]  methocarbamol (ROBAXIN) 500 MG tablet Take 1 tablet (500 mg total) by mouth 2 (two) times daily. 11/02/22   Baloch, Mahnoor, MD  naltrexone (DEPADE) 50 MG tablet Take 50 mg by mouth daily. Patient not taking: Reported on 01/06/2022 12/29/21   [provider]  nicotine (NICODERM CQ - DOSED IN MG/24 HOURS) 14 mg/24hr patch Place 1 patch (14 mg total) onto the skin daily. 01/13/22   Lance Muss, MD  pantoprazole (PROTONIX) 40 MG tablet Take 1 tablet (40 mg total) by mouth daily. 03/23/23 04/22/23  Evette Georges, MD  traZODone (DESYREL) 50 MG tablet Take 1 tablet (50 mg total) by mouth at bedtime as needed for sleep. 02/27/22   Jerre Simon, MD  triamcinolone cream (KENALOG) 0.1 % Apply 1 Application topically 2 (two) times  daily. DO NOT USE ON FACE 08/01/22   Shelby Mattocks, DO  fluticasone (FLONASE) 50 MCG/ACT nasal spray Place 2 sprays into both nostrils daily. Patient taking differently: Place 2 sprays into both nostrils daily as needed for allergies.  02/25/17 04/14/19  Belinda Fisher, PA-C  promethazine (PHENERGAN) 25 MG tablet Take 1 tablet (25 mg total) by mouth every 6 (six) hours as needed for nausea or vomiting. 04/17/18 08/29/18  Jacalyn Lefevre, MD     Critical care time:      Steffanie Dunn, DO 04/16/23 11:36 PM [facility]  For contact information, see Amion. If no response to pager, please call PCCM consult pager. After hours, 7PM- 7AM, please call Elink.

## 2023-04-16 NOTE — Progress Notes (Signed)
   PCCM transfer request    Sending physician: Adela Lank  Sending facility: [facility]  Reason for transfer: hypotension  Brief case summary: n/v/d x 3 days, hypotensive 70s arrival, looked ok and was able to walk from stretcher to bed. BP slightly better with IVF, LA 3.8, minimally downtrending.  H/o NICM. Getting sepsis fluids now, given Flagyl & rocephin in ED.   Recommendations made prior to transfer: repeat LA  Transfer accepted: yes    Steffanie Dunn 04/16/23 9:41 PM [facility]  For contact information, see Amion. If no response to pager, please call PCCM consult pager. After hours, 7PM- 7AM, please call Elink.

## 2023-04-16 NOTE — ED Triage Notes (Signed)
Pt is here for viral like illness with body aches, fatigue, generalized weakness, diarrhea x2 days.  Pt states that she feels dehydrated.  Ambulatory to triage.

## 2023-04-16 NOTE — Plan of Care (Signed)
Plan of Care Note for accepted transfer  [name]              ZOX:[id number]  DOA: 04/16/2023     Facility requesting transfer: [facility] emergency department Requesting Provider: Melene Plan, DO  Reason for transfer: Septic shock unknown source of origin, hyponatremia, AKI on CKD stage IIIb, cardiorenal syndrome  Facility course: 48 year old female history of chronic pain syndrome, insomnia, chronic CHF reduced EF 25 to 30%, chronic anemia, ischemic cardiomyopathy, and GERD presented to emergency with complaining of feeling dizziness, nausea, vomiting and diarrhea.  Unable to tolerate any food for last few days.  Patient also complaining of abdominal pain which is chronic in nature.  Denies any hematemesis or melena.  Patient also reported shortness of breath earlier today which has been improved.  At presentation to ED patient found hypotensive blood pressure 71/52 which improved to 93/59 otherwise hemodynamically stable. Initial lactic acid 3.8 which improved to 3.4 only. Troponin 3 within normal range. Cultures are in process.  UA showed leukocyte esterase positive and many bacteria.  Urine cultures are pending. Respiratory panel negative for COVID, RSV and flu. CBC no evidence of leukocytosis stable H&H. CMP showing low sodium 126, low chloride 90, low bicarb 21, elevated BUN 40, elevated creatinine 3.85 and transaminitis 63.  CT abdomen pelvis no acute abdominal pelvic finding.  No renal abnormal study calculi.  Stable periumbilical and supraumbilical abdominal wall hernia.  Chest x-ray showing wall ill-defined right lung base opacity may represent atelectasis/scarring or infection.  In the ED code sepsis has been activated.  Patient has been received ceftriaxone and metronidazole and 2 L of NS bolus.  Discussed with Dr. Adela Lank I am concerned that patient has septic shock with unknown source of infection given patient  low EF of only 25 to 30% and already received 2 L of  fluid and lactic acid is still elevated 3.4 and MAP is around 58-63 patient would not be appropriate for progressive unit as she will need IV pressor support as well as fluid resuscitation. Requested Dr. Adela Lank to consult PCCM for recommendation for pressor support.  Deferring admission at this time.    Author: Tereasa Coop, MD  04/16/2023  [facility]

## 2023-04-17 ENCOUNTER — Inpatient Hospital Stay (HOSPITAL_COMMUNITY): Payer: Medicaid Other

## 2023-04-17 DIAGNOSIS — R578 Other shock: Secondary | ICD-10-CM | POA: Diagnosis not present

## 2023-04-17 DIAGNOSIS — N179 Acute kidney failure, unspecified: Secondary | ICD-10-CM | POA: Diagnosis not present

## 2023-04-17 DIAGNOSIS — E871 Hypo-osmolality and hyponatremia: Secondary | ICD-10-CM | POA: Diagnosis not present

## 2023-04-17 LAB — BASIC METABOLIC PANEL
Anion gap: 10 (ref 5–15)
Anion gap: 10 (ref 5–15)
Anion gap: 13 (ref 5–15)
Anion gap: 7 (ref 5–15)
Anion gap: 9 (ref 5–15)
BUN: 26 mg/dL — ABNORMAL HIGH (ref 6–20)
BUN: 29 mg/dL — ABNORMAL HIGH (ref 6–20)
BUN: 32 mg/dL — ABNORMAL HIGH (ref 6–20)
BUN: 32 mg/dL — ABNORMAL HIGH (ref 6–20)
BUN: 33 mg/dL — ABNORMAL HIGH (ref 6–20)
CO2: 20 mmol/L — ABNORMAL LOW (ref 22–32)
CO2: 20 mmol/L — ABNORMAL LOW (ref 22–32)
CO2: 21 mmol/L — ABNORMAL LOW (ref 22–32)
CO2: 21 mmol/L — ABNORMAL LOW (ref 22–32)
CO2: 21 mmol/L — ABNORMAL LOW (ref 22–32)
Calcium: 7.6 mg/dL — ABNORMAL LOW (ref 8.9–10.3)
Calcium: 7.7 mg/dL — ABNORMAL LOW (ref 8.9–10.3)
Calcium: 8.1 mg/dL — ABNORMAL LOW (ref 8.9–10.3)
Calcium: 8.1 mg/dL — ABNORMAL LOW (ref 8.9–10.3)
Calcium: 8.4 mg/dL — ABNORMAL LOW (ref 8.9–10.3)
Chloride: 100 mmol/L (ref 98–111)
Chloride: 104 mmol/L (ref 98–111)
Chloride: 97 mmol/L — ABNORMAL LOW (ref 98–111)
Chloride: 97 mmol/L — ABNORMAL LOW (ref 98–111)
Chloride: 99 mmol/L (ref 98–111)
Creatinine, Ser: 1.88 mg/dL — ABNORMAL HIGH (ref 0.44–1.00)
Creatinine, Ser: 2.04 mg/dL — ABNORMAL HIGH (ref 0.44–1.00)
Creatinine, Ser: 2.19 mg/dL — ABNORMAL HIGH (ref 0.44–1.00)
Creatinine, Ser: 2.61 mg/dL — ABNORMAL HIGH (ref 0.44–1.00)
Creatinine, Ser: 2.67 mg/dL — ABNORMAL HIGH (ref 0.44–1.00)
GFR, Estimated: 21 mL/min — ABNORMAL LOW (ref >60)
GFR, Estimated: 22 mL/min — ABNORMAL LOW (ref >60)
GFR, Estimated: 27 mL/min — ABNORMAL LOW (ref >60)
GFR, Estimated: 29 mL/min — ABNORMAL LOW (ref >60)
GFR, Estimated: 32 mL/min — ABNORMAL LOW (ref >60)
Glucose, Bld: 100 mg/dL — ABNORMAL HIGH (ref 70–99)
Glucose, Bld: 105 mg/dL — ABNORMAL HIGH (ref 70–99)
Glucose, Bld: 109 mg/dL — ABNORMAL HIGH (ref 70–99)
Glucose, Bld: 86 mg/dL (ref 70–99)
Glucose, Bld: 93 mg/dL (ref 70–99)
Potassium: 3.9 mmol/L (ref 3.5–5.1)
Potassium: 4 mmol/L (ref 3.5–5.1)
Potassium: 4 mmol/L (ref 3.5–5.1)
Potassium: 4.4 mmol/L (ref 3.5–5.1)
Potassium: 4.9 mmol/L (ref 3.5–5.1)
Sodium: 128 mmol/L — ABNORMAL LOW (ref 135–145)
Sodium: 129 mmol/L — ABNORMAL LOW (ref 135–145)
Sodium: 130 mmol/L — ABNORMAL LOW (ref 135–145)
Sodium: 130 mmol/L — ABNORMAL LOW (ref 135–145)
Sodium: 132 mmol/L — ABNORMAL LOW (ref 135–145)

## 2023-04-17 LAB — IRON AND TIBC
Iron: 88 ug/dL (ref 28–170)
Saturation Ratios: 61 % — ABNORMAL HIGH (ref 10.4–31.8)
TIBC: 144 ug/dL — ABNORMAL LOW (ref 250–450)
UIBC: 56 ug/dL

## 2023-04-17 LAB — CBC
HCT: 23.9 % — ABNORMAL LOW (ref 36.0–46.0)
Hemoglobin: 8.2 g/dL — ABNORMAL LOW (ref 12.0–15.0)
MCH: 31.4 pg (ref 26.0–34.0)
MCHC: 34.3 g/dL (ref 30.0–36.0)
MCV: 91.6 fL (ref 80.0–100.0)
Platelets: 111 10*3/uL — ABNORMAL LOW (ref 150–400)
RBC: 2.61 MIL/uL — ABNORMAL LOW (ref 3.87–5.11)
RDW: 13.5 % (ref 11.5–15.5)
WBC: 3.2 10*3/uL — ABNORMAL LOW (ref 4.0–10.5)
nRBC: 0 % (ref 0.0–0.2)

## 2023-04-17 LAB — HEMOGLOBIN A1C
Hgb A1c MFr Bld: 4.9 % (ref 4.8–5.6)
Mean Plasma Glucose: 93.93 mg/dL

## 2023-04-17 LAB — GLUCOSE, CAPILLARY
Glucose-Capillary: 168 mg/dL — ABNORMAL HIGH (ref 70–99)
Glucose-Capillary: 100 mg/dL — ABNORMAL HIGH (ref 70–99)
Glucose-Capillary: 104 mg/dL — ABNORMAL HIGH (ref 70–99)
Glucose-Capillary: 120 mg/dL — ABNORMAL HIGH (ref 70–99)
Glucose-Capillary: 102 mg/dL — ABNORMAL HIGH (ref 70–99)

## 2023-04-17 LAB — CORTISOL: Cortisol, Plasma: 6.7 ug/dL

## 2023-04-17 LAB — ECHOCARDIOGRAM COMPLETE
Area-P 1/2: 4.31 cm2
Calc EF: 60.3 %
S' Lateral: 2.8 cm
Single Plane A2C EF: 63.8 %
Single Plane A4C EF: 52.5 %

## 2023-04-17 LAB — VITAMIN B12: Vitamin B-12: 1014 pg/mL — ABNORMAL HIGH (ref 180–914)

## 2023-04-17 LAB — MRSA NEXT GEN BY PCR, NASAL: MRSA by PCR Next Gen: NOT DETECTED

## 2023-04-17 LAB — FOLATE: Folate: 5 ng/mL — ABNORMAL LOW (ref >5.9)

## 2023-04-17 LAB — FERRITIN: Ferritin: 317 ng/mL — ABNORMAL HIGH (ref 11–307)

## 2023-04-17 LAB — MAGNESIUM: Magnesium: 1.7 mg/dL (ref 1.7–2.4)

## 2023-04-17 LAB — PHOSPHORUS: Phosphorus: 4.5 mg/dL (ref 2.5–4.6)

## 2023-04-17 LAB — LACTIC ACID, PLASMA: Lactic Acid, Venous: 2.7 mmol/L (ref 0.5–1.9)

## 2023-04-17 MED ORDER — LACTATED RINGERS IV BOLUS
1000.0000 mL | Freq: Once | INTRAVENOUS | Status: AC
  Administered 2023-04-17: 1000 mL via INTRAVENOUS

## 2023-04-17 MED ORDER — CYCLOBENZAPRINE HCL 5 MG PO TABS
5.0000 mg | ORAL_TABLET | Freq: Three times a day (TID) | ORAL | Status: DC | PRN
  Administered 2023-04-17 – 2023-04-19 (×5): 5 mg via ORAL
  Filled 2023-04-17 (×5): qty 1

## 2023-04-17 MED ORDER — SODIUM CHLORIDE 0.9 % IV SOLN: INTRAVENOUS | Status: AC

## 2023-04-17 MED ORDER — INSULIN ASPART 100 UNIT/ML IJ SOLN: 0.0000 [IU] | Freq: Three times a day (TID) | INTRAMUSCULAR | Status: DC

## 2023-04-17 MED ORDER — OXYCODONE HCL 5 MG PO TABS
5.0000 mg | ORAL_TABLET | Freq: Two times a day (BID) | ORAL | Status: AC | PRN
  Administered 2023-04-17 – 2023-04-19 (×4): 5 mg via ORAL
  Filled 2023-04-17 (×5): qty 1

## 2023-04-17 NOTE — Progress Notes (Signed)
[facility] J. Daniel informed via chat patient is refusing Heparin Sq and insulin

## 2023-04-17 NOTE — Discharge Instructions (Addendum)
 Dear Kristin [name] Sieve,   Thank you for letting us  participate in your care! In this section, you will find a brief hospital admission summary of why you were admitted to the hospital, what happened during your admission, your diagnosis/diagnoses, and recommended follow up.  Primary diagnosis: Eosinophilic Esophagitis Treatment plan: pantoprazole  40 mg two times daily Secondary diagnosis: Pancytopenia Treatment plan: follow up with outpatient labs in 2-3 weeks   POST-HOSPITAL & CARE INSTRUCTIONS We recommend following up with your PCP within 1 week from being discharged from the hospital. Please let PCP/Specialists know of any changes in medications that were made which you will be able to see in the medications section of this packet. Please also follow up with gastroenterology, their office will contact you to schedule follow up  DOCTOR'S APPOINTMENTS & FOLLOW UP No future appointments.   Thank you for choosing [facility]! Take care and be well!  Family Medicine Teaching Service Inpatient Team [place]  [facility]  [address] [place], [place] [address] [phone]   Community Resource Guide Shelters The [employer]'s "S3741234" is a great source of information about community services available.  Access by dialing 2-1-1 from anywhere in [place] , or by website -  pooledincome.pl.  [facility]) now has a full time staff to take referrals for all individuals needing shelter or housing placement. They do not do direct services or have beds, but are in charge of assessing and coordinating placement for individuals needing shelter. The phone number for coordinated entry is [phone].    Other Armed Forces Technical Officer Number and Address  [facility] Housing for homeless and needy men with substance abuse issues 5 day Covid Quarantine [phone] [address] [place], [place]  [employer] of [place] Emergency assistance for General mills only [employer] [phone] Ext. [address] [place], [place]  [employer] [facility] Domestic violence shelter for women and their children [phone] [place], [place]  [facility] Domestic violence shelter for women and their children Each family gets their own unit and can quarantine after admission. [phone] , [place]  [facility]) / Resources for the [employer] center for the homeless Information and referral to housing resources Counseling Show[employer] La[employer] Bar[employer]hop Phone bank Mailroom Computer lab Medical clinic Bike maintenance center 14 Day covid quarantine [phone] [address] [place], [place]  [facility] [employer] Men's [facility] Emergency housing Food Emergency financial assistance Permanent supportive housing [phone] [address] [place], [place]  [employer] Crisis assistance Medication Housing Food Utility assistance [phone] [address] [place], [place]   [phone] [address], [place], [place]  [employer] of [place]       Transitional housing Case Chartered Certified Accountant assistance [phone] [address] [place], [place]  [facility], [employer] for adult men and women Can admit with MD clearance from the hospital after positive covid test.  Intake Hotline [phone] [address] [place], [place]  24-hour Crisis Line for those Facing Homelessness   Information and referral to community resources [phone]  [employer] and additional resources. Can admit with MD clearance after positive covid test.  Prefer online applications (blocked on [facility] computers)/ currently full. Will be able to do intake at the office starting in  May.  [phone] Admin only location     Low Income [employer] Supported Living Apts [phone] [facility] [phone]  [facility] [address], [place] [phone]  [facility] [address] [place] [phone]  [facility] [address] [place]. [place] [phone]  [place] [address] [place] [phone]  [place] [facility]  [address] [place] [phone]  [employer] [place] [employer] [address] [place] [phone]  [facility] [address] [place] [address] [place] [phone]  [facility] [address], [place] [phone]  [facility] [address]  [facility] [phone]  [facility]  [address] [place] [phone]  [place] [address]  [address] [place] [phone]   [place] [address] [place] [phone]  [place] [facility] [address] [place] [phone] [address], [place] [phone]  [facility] Apts. [address], [place] [phone]  [place] [address] [place] [phone]  [facility] Apts. [address] [place] [phone]  [employer] Apts.  [address], [place] [phone]  [facility] A[address], [place] [phone] Apts[address]  [address] [phone]  [facility] Apts. [address] [place] [phone]  [place] [address]  [address] [facility] [phone]

## 2023-04-17 NOTE — TOC Initial Note (Addendum)
 Transition of Care (TOC) - Initial/Assessment Note   Spoke to patient at bedside. Patient was noted for housing insecurity. Patient states she is currently staying with daughter but would like resources. Resources added to AVS.    Transition of Care Department [facility])  needs have been identified at this time. We will continue to monitor patient advancement through interdisciplinary progression rounds. If new patient transition needs arise, please place a TOC consult.  Patient declined substance use resources     Patient Details  Name: Kristin [name] MRN: [id number] Date of Birth: [birth year]  Transition of Care [facility]) CM/SW Contact:    Stephane Powell Jansky, RN Phone Number: 04/17/2023, 2:19 PM  Clinical Narrative:                   Expected Discharge Plan: Home/Self Care Barriers to Discharge: Continued Medical Work up   Patient Goals and CMS Choice Patient states their goals for this hospitalization and ongoing recovery are:: to return to home          Expected Discharge Plan and Services In-house Referral: NA Discharge Planning Services: CM Consult Post Acute Care Choice: NA Living arrangements for the past 2 months: Apartment                 DME Arranged: N/A DME Agency: NA       HH Arranged: NA HH Agency: NA        Prior Living Arrangements/Services Living arrangements for the past 2 months: Apartment Lives with:: Adult Children Patient language and need for interpreter reviewed:: Yes Do you feel safe going back to the place where you live?: Yes      Need for Family Participation in Patient Care: Yes (Comment) Care giver support system in place?: Yes (comment)   Criminal Activity/Legal Involvement Pertinent to Current Situation/Hospitalization: No - Comment as needed  Activities of Daily Living      Permission Sought/Granted   Permission granted to share information with : No              Emotional Assessment Appearance:: Appears stated  age Attitude/Demeanor/Rapport: Engaged Affect (typically observed): Accepting Orientation: : Oriented to Self, Oriented to Place, Oriented to  Time, Oriented to Situation Alcohol / Substance Use: Not Applicable Psych Involvement: No (comment)  Admission diagnosis:  Sepsis [facility]) [A41.9] Patient Active Problem List   Diagnosis Date Noted   Sepsis (HCC) 04/16/2023   UTI (urinary tract infection) 01/18/2023   Pyelonephritis 10/19/2022   Neuropathy 06/29/2022   Physical deconditioning 01/10/2022   Stress-induced cardiomyopathy    Acute HFrEF (heart failure with reduced ejection fraction) (HCC) 01/06/2022   Substance abuse (HCC) 01/06/2022   Spinal stenosis of lumbar region with neurogenic claudication 11/25/2021   Primary osteoarthritis of both knees 12/02/2020   Chronic bilateral low back pain without sciatica 12/02/2020   Functional diarrhea    Migraine headache    Chronic anemia    Recurrent UTI 08/06/2019   Esophagitis determined by endoscopy    Generalized anxiety disorder 08/23/2015   Eczema 03/26/2012   Alcohol use disorder, mild, abuse 10/26/2010   Bipolar 1 disorder (HCC) 10/09/2010   Panic attacks 10/09/2010   Bilateral chronic knee pain 11/03/2009   Obesity 10/29/2008   Essential hypertension 12/20/2006   PCP:  Rosendo Rush, MD Pharmacy:   [id number] - [place], [place] - [address] AT [facility] [address] [place] [address] Phone: [phone] Fax: [phone]  [facility] - [place], [place] - [address] [facility] AT Portal to Registered [facility] [address] [place] [address] Phone: [phone] Fax: [phone]  [id number] [place], [place] - [address] AT [facility] [address] [place] [address] Phone: [phone] Fax: [phone]  [facility] [place] [facility] [address] [place] [address] Phone: [phone] Fax: [phone]     Social Drivers of Health (SDOH) Social History: SDOH Screenings   Food Insecurity: Unknown (01/07/2022)  Housing: Medium Risk (01/07/2022)  Transportation Needs: No Transportation Needs (09/22/2020)  Utilities: Not At Risk (01/07/2022)  Depression (PHQ2-9): High Risk (03/22/2023)  Financial Resource Strain: Medium Risk (12/05/2017)  Physical Activity: Sufficiently Active (12/05/2017)  Social Connections: Unknown (12/19/2021)   Received from [facility]  Stress: Stress Concern Present (09/22/2020)  Tobacco Use: High Risk (04/16/2023)   SDOH Interventions:     Readmission Risk Interventions     No data to display

## 2023-04-17 NOTE — Progress Notes (Signed)
 NAME:  Kristin [name], MRN:  [id number], DOB:  [birth year], LOS: 1 ADMISSION DATE:  04/16/2023, CONSULTATION DATE:  2/3 REFERRING MD:  Emil, CHIEF COMPLAINT:  hypotension   History of Present Illness:  Kristin [name] is a 48 y/o woman with a history of HTN, bipolar disorder, previous Takotsubo cardiomyopathy with EF of 25-30%, who presented with lightheadedness, dizziness, and feeling dehydrated. She has chronic reflux and frequent vomiting, so her PO intake is minimal. Famotidine  and Tums have not helped this chronically. She has significant weigh fluctuations due to her variable PO intake. She did not take her lisinopril  today due to getting too busy at work; last dose on 2/2. She denies diarrhea. In the ED she was volume resuscitated but continued to have low Bps. Lactic also elevated to 3.8 initially. CMP with low Na to 126, cl 90, bicarb 21, and cr elevated to 3.85. Per EDP she was able to ambulate and did not appear altered. Due to low Bps in the ED PCCM was consulted for admission after TRH turned the patient down for floor admission.   Pertinent  Medical History  HFrEF 2/2 takotsubo Bipolar disorder HTN GERD  Significant Hospital Events: Including procedures, antibiotic start and stop dates in addition to other pertinent events   2/3 admit to ICU, never required pressors  Interim History / Subjective:  Seen at the bedside. Denies any diarrhea but endorses poor appetite. Has been ambulating throughout her room without any dizziness/lightheadedness.   Objective   Blood pressure (!) 94/46, pulse 83, temperature 98.6 F (37 C), temperature source Oral, resp. rate 15, SpO2 99%.        Intake/Output Summary (Last 24 hours) at 04/17/2023 0809 Last data filed at 04/17/2023 0700 Gross per 24 hour  Intake 4123.77 ml  Output --  Net 4123.77 ml   There were no vitals filed for this visit.  Examination: General: well appearing middle aged female HENT: [place]/AT Lungs: non labored breathing on  room air, CTAB Cardiovascular: regular rate, rhythm. Abdomen: soft, nondistended. Mild tenderness in RUQ Extremities: no edema Neuro: awake, alert. Answers questions appropriately, no focal deficit  Na 126 > 129 Cr 3.85 > 2.61 Ca 7.6 Mg 1.7 Lactic 2.7  Folate 5 Hb 11.1 > 9.4 > 8.2   Resolved Hospital Problem list   N/a  Assessment & Plan:  Hyponatremia - Improved to 129 with IVF - BMP q6h for Na monitoring - Continue NS throughout today  Hypotension Hx of hypertension S/p ceftriaxone  and flagyl  given concern for septic shock, unclear etiology. Also s/p >3L IVF bolus. CT abd was negative for any acute findings. Adrenal insufficiency also on the differential given hyponNa + hypotension.  - AM coritsol indeterminate - consider ACTH stim test - Holding home lisinopril  - IVF: NS 100 cc/hr  AKI on CKD2 - Cr improving today - IVF  as above - Renally dosed meds  Hx Takotsubo CM Last echo in 2023 with EF of 25-30%, severely decreased LV function, and indeterminate LV diastolic function.  - Echo pending  Anemia - Hb downtrending to 8.2, likely dilutional   Diarrhea - GI panel, C diff   Patient is stable for transfer out of ICU  Best Practice (right click and Reselect all SmartList Selections daily)   Diet/type: Regular consistency (see orders) DVT prophylaxis prophylactic heparin   Pressure ulcer(s): N/A GI prophylaxis: PPI Lines: N/A Foley:  N/A Code Status:  full code Last date of multidisciplinary goals of care discussion [updated pt 2/4]  Labs  CBC: Recent Labs  Lab 04/16/23 1735 04/16/23 2339 04/17/23 0424  WBC 4.2 3.6* 3.2*  NEUTROABS 2.1  --   --   HGB 11.1* 9.4* 8.2*  HCT 32.2* 27.1* 23.9*  MCV 91.0 91.2 91.6  PLT 156 127* 111*    Basic Metabolic Panel: Recent Labs  Lab 04/16/23 1735 04/16/23 2339 04/17/23 0424 04/17/23 0657  NA 126* 130* 129* 128*  K 4.5 4.9 4.0 4.0  CL 90* 97* 100 97*  CO2 21* 20* 20* 21*  GLUCOSE 95 86 100* 93   BUN 40* 33* 32* 32*  CREATININE 3.85* 2.67* 2.61* 2.19*  CALCIUM  8.3* 8.1* 7.6* 7.7*  MG  --   --  1.7  --   PHOS  --   --  4.5  --    GFR: CrCl cannot be calculated (Unknown ideal weight.). Recent Labs  Lab 04/16/23 1735 04/16/23 1947 04/16/23 2155 04/16/23 2339 04/17/23 0424  WBC 4.2  --   --  3.6* 3.2*  LATICACIDVEN 3.8* 3.4* 3.0*  --  2.7*    Liver Function Tests: Recent Labs  Lab 04/16/23 1735  AST 63*  ALT 12  ALKPHOS 102  BILITOT 0.6  PROT 6.1*  ALBUMIN 3.7   No results for input(s): LIPASE, AMYLASE in the last 168 hours. No results for input(s): AMMONIA in the last 168 hours.  ABG    Component Value Date/Time   PHART 7.415 01/09/2022 0818   PCO2ART 34.2 01/09/2022 0818   PO2ART 60 (L) 01/09/2022 0818   HCO3 23.6 01/09/2022 0821   TCO2 25 01/09/2022 0821   ACIDBASEDEF 1.0 01/09/2022 0821   O2SAT 53 01/09/2022 0821     Coagulation Profile: No results for input(s): INR, PROTIME in the last 168 hours.  Cardiac Enzymes: No results for input(s): CKTOTAL, CKMB, CKMBINDEX, TROPONINI in the last 168 hours.  HbA1C: Hgb A1c MFr Bld  Date/Time Value Ref Range Status  04/16/2023 11:39 PM 4.9 4.8 - 5.6 % Final    Comment:    (NOTE) Pre diabetes:          5.7%-6.4%  Diabetes:              >6.4%  Glycemic control for   <7.0% adults with diabetes   06/27/2022 04:41 PM 4.8 4.8 - 5.6 % Final    Comment:             Prediabetes: 5.7 - 6.4          Diabetes: >6.4          Glycemic control for adults with diabetes: <7.0     CBG: Recent Labs  Lab 04/16/23 2302 04/17/23 0344 04/17/23 0723  GLUCAP 106* 168* 100*    Review of Systems:   As above  Past Medical History:  She,  has a past medical history of Acid reflux, ACL tear (2011), Acute lateral meniscus tear of right knee, Anxiety, Arthritis, Back pain, Bipolar 1 disorder (HCC), Depression, Dysfunctional uterine bleeding, Eczema, Hypertension, Insomnia, Migraine headache,  Neuromuscular disorder (HCC), Osteoarthritis of right knee (12/21/2014), Respiratory distress (01/12/2022), and Sepsis with acute hypoxic respiratory failure without septic shock, due to unspecified organism (HCC) (01/06/2022).   Surgical History:   Past Surgical History:  Procedure Laterality Date   BIOPSY  09/19/2017   Procedure: BIOPSY;  Surgeon: Elicia Claw, MD;  Location: [facility];  Service: Gastroenterology;;   BIOPSY  10/15/2019   Procedure: BIOPSY;  Surgeon: Dianna Specking, MD;  Location: [facility];  Service: Endoscopy;;  COLONOSCOPY N/A 10/15/2019   Procedure: COLONOSCOPY;  Surgeon: Dianna Specking, MD;  Location: [facility];  Service: Endoscopy;  Laterality: N/A;   DILITATION & CURRETTAGE/HYSTROSCOPY WITH HYDROTHERMAL ABLATION N/A 04/04/2017   Procedure: DILATATION & CURETTAGE/HYSTEROSCOPY WITH HYDROTHERMAL ABLATION;  Surgeon: Fredirick Glenys RAMAN, MD;  Location: [facility];  Service: Gynecology;  Laterality: N/A;   ESOPHAGOGASTRODUODENOSCOPY N/A 10/15/2019   Procedure: ESOPHAGOGASTRODUODENOSCOPY (EGD);  Surgeon: Dianna Specking, MD;  Location: [facility];  Service: Endoscopy;  Laterality: N/A;   ESOPHAGOGASTRODUODENOSCOPY (EGD) WITH PROPOFOL  N/A 09/19/2017   Procedure: ESOPHAGOGASTRODUODENOSCOPY (EGD) WITH PROPOFOL ;  Surgeon: Elicia Claw, MD;  Location: [facility];  Service: Gastroenterology;  Laterality: N/A;   KNEE ARTHROSCOPY WITH LATERAL MENISECTOMY Right 09/30/2019   Procedure: KNEE ARTHROSCOPY WITH LATERAL MENISECTOMY;  Surgeon: Beverley Evalene BIRCH, MD;  [facility];  Service: Orthopedics;  Laterality: Right;   RIGHT/LEFT HEART CATH AND CORONARY ANGIOGRAPHY N/A 01/09/2022   Procedure: RIGHT/LEFT HEART CATH AND CORONARY ANGIOGRAPHY;  Surgeon: Elmira Newman PARAS, MD;  Location: [facility];  Service: Cardiovascular;  Laterality: N/A;   TUBAL LIGATION  yrs ago     Social History:   reports that she has been  smoking cigarettes. She has a 1.5 pack-year smoking history. She has been exposed to tobacco smoke. She has never used smokeless tobacco. She reports current alcohol use. She reports that she does not use drugs.   Family History:  Her family history includes Bipolar disorder in her cousin; Breast cancer in her maternal grandmother; Colon cancer in her maternal uncle; Hypertension in her brother and mother; Schizophrenia in her cousin.   Allergies Allergies  Allergen Reactions   Sumatriptan Anaphylaxis    High blood pressure and numb   Other     Bananas-stomach cramps     Home Medications  Prior to Admission medications   Medication Sig Start Date End Date Taking? Authorizing Provider  acetaminophen  (TYLENOL ) 500 MG tablet Take 1 tablet (500 mg total) by mouth every 8 (eight) hours as needed for mild pain. Patient not taking: Reported on 11/02/2022 07/04/19   Benjamine Hamilton, DO  benzonatate  (TESSALON  PERLES) 100 MG capsule Take 1 capsule (100 mg total) by mouth 3 (three) times daily as needed for cough. 12/28/22   Rosendo Rush, MD  dicyclomine  (BENTYL ) 10 MG capsule Take by mouth. Patient not taking: Reported on 11/02/2022 12/07/21   [provider]  famotidine  (PEPCID ) 20 MG tablet Take 1 tablet (20 mg total) by mouth 2 (two) times daily. 02/01/22 06/01/22  Rosendo Rush, MD  gabapentin  (NEURONTIN ) 300 MG capsule Take 1 capsule (300 mg total) by mouth as directed. Take 1 capsule in the morning, 1 capsule in the afternoon, and 3 capsules at night. 03/23/23   Tharon Lung, MD  hydrOXYzine  (ATARAX ) 10 MG tablet Take 1 tablet (10 mg total) by mouth 3 (three) times daily as needed for anxiety or itching (insomnia). Patient not taking: Reported on 11/02/2022 05/09/22   Austin Ade, MD  hydrOXYzine  (ATARAX ) 50 MG tablet Take 1-2 tablets (50-100 mg total) by mouth at bedtime as needed. Patient not taking: Reported on 11/02/2022 06/27/22   Macario Dorothyann HERO, MD  ibuprofen  (ADVIL ) 600 MG tablet Take  1 tablet (600 mg total) by mouth every 6 (six) hours as needed. 09/18/22   Silver Wonda LABOR, PA  lisinopril -hydrochlorothiazide  (ZESTORETIC ) 10-12.5 MG tablet Take 1 tablet by mouth daily. 12/28/22   Rosendo Rush, MD  LORazepam  (ATIVAN ) 0.5 MG tablet Take 0.5 mg by mouth  every 8 (eight) hours. 03/23/22   [provider]  methocarbamol  (ROBAXIN ) 500 MG tablet Take 1 tablet (500 mg total) by mouth 2 (two) times daily. 11/02/22   Baloch, Mahnoor, MD  naltrexone (DEPADE) 50 MG tablet Take 50 mg by mouth daily. Patient not taking: Reported on 01/06/2022 12/29/21   [provider]  nicotine  (NICODERM CQ  - DOSED IN MG/24 HOURS) 14 mg/24hr patch Place 1 patch (14 mg total) onto the skin daily. 01/13/22   Izella Ismael NOVAK, MD  pantoprazole  (PROTONIX ) 40 MG tablet Take 1 tablet (40 mg total) by mouth daily. 03/23/23 04/22/23  Tharon Lung, MD  traZODone  (DESYREL ) 50 MG tablet Take 1 tablet (50 mg total) by mouth at bedtime as needed for sleep. 02/27/22   Rosendo Rush, MD  triamcinolone  cream (KENALOG ) 0.1 % Apply 1 Application topically 2 (two) times daily. DO NOT USE ON FACE 08/01/22   Orlando Pond, DO  fluticasone  (FLONASE ) 50 MCG/ACT nasal spray Place 2 sprays into both nostrils daily. Patient taking differently: Place 2 sprays into both nostrils daily as needed for allergies.  02/25/17 04/14/19  Babara Greig GAILS, PA-C  promethazine  (PHENERGAN ) 25 MG tablet Take 1 tablet (25 mg total) by mouth every 6 (six) hours as needed for nausea or vomiting. 04/17/18 08/29/18  Dean Clarity, MD     Critical care time: 30 min

## 2023-04-17 NOTE — Progress Notes (Signed)
[facility] not covering patient at this time

## 2023-04-17 NOTE — Plan of Care (Signed)

## 2023-04-17 NOTE — Progress Notes (Addendum)
 eLink Physician-Brief Progress Note Patient Name: Kristin [name] DOB: 05-07-1975 MRN: [id number]   Date of Service  04/17/2023  HPI/Events of Note  48 y/o woman with a history of HTN, bipolar disorder, previous Takotsubo cardiomyopathy who presented with lightheadedness, dizziness, and feeling dehydrated. She has chronic reflux and frequent vomiting, so her PO intake is minimal.  Presents with hyponatremia and hypotension.  Vital signs within normal limits.  Results consistent with mild hyponatremia, elevated creatinine, leukopenia and normocytic anemia.  Comfortable appearing  eICU Interventions  Patient receiving normal saline infusion.  Trend renal panel.  DVT prophylaxis with heparin  GI prophylaxis not indicated   0441 -complaining of chronic pain and takes oxycodone  intermittently.  Unable to check PMP.  Slightly more hypotensive now.  Will order 1 L of LR.  Oxycodone  twice daily as needed until BNP can be reassessed.  Social work consult for discharge planning  Intervention Category Evaluation Type: New Patient Evaluation  Sarha Bartelt 04/17/2023, 12:31 AM

## 2023-04-17 NOTE — Progress Notes (Addendum)
Patient is refusing her Heparin and insulin.  Patient educated and physician notified.

## 2023-04-17 NOTE — Progress Notes (Signed)
   04/17/23 1424  SDOH Interventions  Housing Interventions Inpatient TOC   Resources added to AVS

## 2023-04-17 NOTE — Plan of Care (Signed)

## 2023-04-18 ENCOUNTER — Inpatient Hospital Stay (HOSPITAL_COMMUNITY): Payer: Medicaid Other

## 2023-04-18 DIAGNOSIS — N179 Acute kidney failure, unspecified: Principal | ICD-10-CM

## 2023-04-18 DIAGNOSIS — R1013 Epigastric pain: Secondary | ICD-10-CM

## 2023-04-18 DIAGNOSIS — K219 Gastro-esophageal reflux disease without esophagitis: Secondary | ICD-10-CM | POA: Diagnosis not present

## 2023-04-18 DIAGNOSIS — R112 Nausea with vomiting, unspecified: Secondary | ICD-10-CM | POA: Diagnosis not present

## 2023-04-18 DIAGNOSIS — N189 Chronic kidney disease, unspecified: Secondary | ICD-10-CM

## 2023-04-18 DIAGNOSIS — N182 Chronic kidney disease, stage 2 (mild): Secondary | ICD-10-CM

## 2023-04-18 DIAGNOSIS — E860 Dehydration: Secondary | ICD-10-CM

## 2023-04-18 DIAGNOSIS — E871 Hypo-osmolality and hyponatremia: Secondary | ICD-10-CM

## 2023-04-18 DIAGNOSIS — I959 Hypotension, unspecified: Secondary | ICD-10-CM

## 2023-04-18 DIAGNOSIS — D649 Anemia, unspecified: Secondary | ICD-10-CM

## 2023-04-18 LAB — CBC
HCT: 25.8 % — ABNORMAL LOW (ref 36.0–46.0)
Hemoglobin: 8.7 g/dL — ABNORMAL LOW (ref 12.0–15.0)
MCH: 31.8 pg (ref 26.0–34.0)
MCHC: 33.7 g/dL (ref 30.0–36.0)
MCV: 94.2 fL (ref 80.0–100.0)
Platelets: 108 10*3/uL — ABNORMAL LOW (ref 150–400)
RBC: 2.74 MIL/uL — ABNORMAL LOW (ref 3.87–5.11)
RDW: 13.9 % (ref 11.5–15.5)
WBC: 2.7 10*3/uL — ABNORMAL LOW (ref 4.0–10.5)
nRBC: 0 % (ref 0.0–0.2)

## 2023-04-18 LAB — URINE CULTURE: Culture: <10000 — AB

## 2023-04-18 LAB — BASIC METABOLIC PANEL
Anion gap: 7 (ref 5–15)
Anion gap: 8 (ref 5–15)
BUN: 25 mg/dL — ABNORMAL HIGH (ref 6–20)
BUN: 30 mg/dL — ABNORMAL HIGH (ref 6–20)
CO2: 21 mmol/L — ABNORMAL LOW (ref 22–32)
CO2: 24 mmol/L (ref 22–32)
Calcium: 8.4 mg/dL — ABNORMAL LOW (ref 8.9–10.3)
Calcium: 8.7 mg/dL — ABNORMAL LOW (ref 8.9–10.3)
Chloride: 102 mmol/L (ref 98–111)
Chloride: 104 mmol/L (ref 98–111)
Creatinine, Ser: 1.26 mg/dL — ABNORMAL HIGH (ref 0.44–1.00)
Creatinine, Ser: 1.94 mg/dL — ABNORMAL HIGH (ref 0.44–1.00)
GFR, Estimated: 31 mL/min — ABNORMAL LOW (ref >60)
GFR, Estimated: 52 mL/min — ABNORMAL LOW (ref >60)
Glucose, Bld: 108 mg/dL — ABNORMAL HIGH (ref 70–99)
Glucose, Bld: 97 mg/dL (ref 70–99)
Potassium: 4.1 mmol/L (ref 3.5–5.1)
Potassium: 4.3 mmol/L (ref 3.5–5.1)
Sodium: 131 mmol/L — ABNORMAL LOW (ref 135–145)
Sodium: 135 mmol/L (ref 135–145)

## 2023-04-18 LAB — GLUCOSE, CAPILLARY
Glucose-Capillary: 91 mg/dL (ref 70–99)
Glucose-Capillary: 134 mg/dL — ABNORMAL HIGH (ref 70–99)

## 2023-04-18 MED ORDER — PANTOPRAZOLE SODIUM 40 MG PO TBEC
40.0000 mg | DELAYED_RELEASE_TABLET | Freq: Two times a day (BID) | ORAL | Status: DC
  Administered 2023-04-18 – 2023-04-20 (×4): 40 mg via ORAL
  Filled 2023-04-18 (×4): qty 1

## 2023-04-18 MED ORDER — FOLIC ACID 1 MG PO TABS
1.0000 mg | ORAL_TABLET | Freq: Every day | ORAL | Status: DC
  Administered 2023-04-18 – 2023-04-20 (×3): 1 mg via ORAL
  Filled 2023-04-18 (×3): qty 1

## 2023-04-18 NOTE — Consult Note (Signed)
 Consultation  Referring Provider: Dr. Judeth  Primary Care Physician:  Rosendo Rush, MD Primary Gastroenterologist:    Looks like [place] and Dr. Kristie in the past, most recently digestive health-so unassigned here     Reason for Consultation:   GERD         HPI:   Kristin [name] is a 48 y.o. female with a past medical history as listed below including hypertension, bipolar disorder, previous Takotsubo cardiomyopathy (2//25 echo with LVEF 60-65%), presented to the ED on 04/16/2023 for lightheadedness, dizziness and feeling dehydrated.    At time of admission patient described chronic reflux and frequent vomiting so her p.o. intake was minimal.  Apparently taking Famotidine  and Tums which did not help chronically.  Significant weight fluctuations due to variable p.o. intake.    Today, at time of my interview patient tells me that she really just came in here because she was dizzy and lightheaded.  She mentioned that she has chronic reflux symptoms and sometimes inability to eat/drink.  Tells me symptoms seem to flare about 3 to 4 days out of the week and have done this for years.  Apparently started with reflux in 1997 when she was pregnant with her last baby.  Tells me that certain foods tend to make this worse including red sauces and greasy fatty foods.  Often times she will wake up in the morning and feel bad, hot/sweaty and gagging and will sometimes vomit.  When she brushes her teeth she also gags.  Also drinking water  seems to make her nauseous and occasionally she will have reflux.  Sometimes she will be eating and food will come just right back up.  Currently taking Famotidine  20 mg daily and Pepcid  as well as over-the-counter Prilosec or Nexium, she cannot remember which 1.  Does tell me though that she uses the PPI as needed.  Also describes occasional black stool.  Most recently it has been a little runny and urgent but no more than 2 times a day.  Also some abdominal discomfort she tells  me all over.    Denies fever, chills or weight loss.  ED course: Volume resuscitated but continued to have low BPs; 04/16/2023 CTAP without contrast showed no acute abdominal/pelvic findings, stable periumbilical and supraumbilical abdominal wall hernias containing fat, hemoglobin 11.1 on 04/16/2023--> 8.7 today, MCV normal  GI history: 12/23/2021 EGD for nausea and vomiting at digestive health-cannot see report (patient cannot remember findings either) 10/15/2019 colonoscopy Dr. Kari with internal hemorrhoids and otherwise normal 10/15/19 EGD Dr. Dianna done for iron deficiency anemia with acute gastritis and otherwise normal  Past Medical History:  Diagnosis Date   Acid reflux    ACL tear 2011   not sure which side   Acute lateral meniscus tear of right knee    Anxiety    Arthritis    Back pain    slipped disc lower back lumbar   Bipolar 1 disorder (HCC)    Depression    Dysfunctional uterine bleeding    Eczema    Hypertension    Insomnia    Migraine headache    Neuromuscular disorder (HCC)    leg cramps   Osteoarthritis of right knee 12/21/2014   ACL deficit since 2011   Respiratory distress 01/12/2022   Sepsis with acute hypoxic respiratory failure without septic shock, due to unspecified organism (HCC) 01/06/2022    Past Surgical History:  Procedure Laterality Date   BIOPSY  09/19/2017   Procedure: BIOPSY;  Surgeon: Elicia Claw, MD;  Location: [facility];  Service: Gastroenterology;;   BIOPSY  10/15/2019   Procedure: BIOPSY;  Surgeon: Dianna Specking, MD;  Location: [facility];  Service: Endoscopy;;   COLONOSCOPY N/A 10/15/2019   Procedure: COLONOSCOPY;  Surgeon: Dianna Specking, MD;  Location: [facility];  Service: Endoscopy;  Laterality: N/A;   DILITATION & CURRETTAGE/HYSTROSCOPY WITH HYDROTHERMAL ABLATION N/A 04/04/2017   Procedure: DILATATION & CURETTAGE/HYSTEROSCOPY WITH HYDROTHERMAL ABLATION;  Surgeon: Fredirick Glenys RAMAN, MD;  Location: [facility];  Service: Gynecology;  Laterality: N/A;   ESOPHAGOGASTRODUODENOSCOPY N/A 10/15/2019   Procedure: ESOPHAGOGASTRODUODENOSCOPY (EGD);  Surgeon: Dianna Specking, MD;  Location: [facility];  Service: Endoscopy;  Laterality: N/A;   ESOPHAGOGASTRODUODENOSCOPY (EGD) WITH PROPOFOL  N/A 09/19/2017   Procedure: ESOPHAGOGASTRODUODENOSCOPY (EGD) WITH PROPOFOL ;  Surgeon: Elicia Claw, MD;  Location: [facility];  Service: Gastroenterology;  Laterality: N/A;   KNEE ARTHROSCOPY WITH LATERAL MENISECTOMY Right 09/30/2019   Procedure: KNEE ARTHROSCOPY WITH LATERAL MENISECTOMY;  Surgeon: Beverley Evalene BIRCH, MD;  Location: [facility];  Service: Orthopedics;  Laterality: Right;   RIGHT/LEFT HEART CATH AND CORONARY ANGIOGRAPHY N/A 01/09/2022   Procedure: RIGHT/LEFT HEART CATH AND CORONARY ANGIOGRAPHY;  Surgeon: Elmira Newman PARAS, MD;  Location: [facility];  Service: Cardiovascular;  Laterality: N/A;   TUBAL LIGATION  yrs ago    Family History  Problem Relation Age of Onset   Breast cancer Maternal Grandmother    Hypertension Mother    Hypertension Brother    Colon cancer Maternal Uncle    Bipolar disorder Cousin    Schizophrenia Cousin     Social History   Tobacco Use   Smoking status: Some Days    Current packs/day: 0.30    Average packs/day: 0.3 packs/day for 5.0 years (1.5 ttl pk-yrs)    Types: Cigarettes    Passive exposure: Current   Smokeless tobacco: Never  Vaping Use   Vaping status: Never Used  Substance Use Topics   Alcohol use: Yes    Comment: occassionally, longer than one week ago   Drug use: No    Prior to Admission medications   Medication Sig Start Date End Date Taking? Authorizing Provider  Acetaminophen  (TYLENOL  PO) Take 2 tablets by mouth 2 (two) times daily as needed (pain).   Yes [provider]  Calcium  Carbonate Antacid (TUMS PO) Take 1-2 tablets by mouth 2 (two) times daily as needed (heartburn, acid reflux).   Yes [provider]  cyclobenzaprine  (FLEXERIL ) 5 MG tablet Take 5 mg by mouth 3 (three) times daily as needed for muscle spasms. 04/13/23  Yes [provider]  famotidine  (PEPCID ) 20 MG tablet Take 1 tablet (20 mg total) by mouth 2 (two) times daily. 02/01/22 06/16/23 Yes Rosendo Rush, MD  gabapentin  (NEURONTIN ) 300 MG capsule Take 1 capsule (300 mg total) by mouth as directed. Take 1 capsule in the morning, 1 capsule in the afternoon, and 3 capsules at night. Patient taking differently: Take 600 mg by mouth 2 (two) times daily as needed (neuropathy). 03/23/23  Yes Mabe, Elna, MD  ibuprofen  (ADVIL ) 200 MG tablet Take 600 mg by mouth 2 (two) times daily as needed for moderate pain (pain score 4-6).   Yes [provider]  lisinopril -hydrochlorothiazide  (ZESTORETIC ) 10-12.5 MG tablet Take 1 tablet by mouth daily. 12/28/22  Yes Rosendo Rush, MD  Multiple Vitamins-Minerals (MULTIVITAMIN WOMEN) TABS Take 1 tablet by mouth daily.   Yes [provider]  pantoprazole  (PROTONIX ) 40 MG tablet Take 1  tablet (40 mg total) by mouth daily. Patient taking differently: Take 40 mg by mouth daily as needed (acid reflux). 03/23/23 04/22/23 Yes Mabe, Elna, MD  triamcinolone  cream (KENALOG ) 0.1 % Apply 1 Application topically 2 (two) times daily. DO NOT USE ON FACE Patient taking differently: Apply 1 Application topically 2 (two) times daily as needed (skin irritation). 08/01/22  Yes Orlando Pond, DO  fluticasone  (FLONASE ) 50 MCG/ACT nasal spray Place 2 sprays into both nostrils daily. Patient taking differently: Place 2 sprays into both nostrils daily as needed for allergies.  02/25/17 04/14/19  Babara Greig GAILS, PA-C  promethazine  (PHENERGAN ) 25 MG tablet Take 1 tablet (25 mg total) by mouth every 6 (six) hours as needed for nausea or vomiting. 04/17/18 08/29/18  Dean Clarity, MD    Current Facility-Administered Medications  Medication Dose Route Frequency Provider Last Rate Last Admin   acetaminophen   (TYLENOL ) tablet 650 mg  650 mg Oral Q4H PRN Atway, Rayann N, DO   650 mg at 04/18/23 9479   Chlorhexidine  Gluconate Cloth 2 % PADS 6 each  6 each Topical Daily Atway, Rayann N, DO   6 each at 04/16/23 2256   cyclobenzaprine  (FLEXERIL ) tablet 5 mg  5 mg Oral TID PRN Ogan, Okoronkwo U, MD   5 mg at 04/18/23 9478   docusate sodium  (COLACE) capsule 100 mg  100 mg Oral BID PRN Atway, Rayann N, DO       heparin  injection 5,000 Units  5,000 Units Subcutaneous Q8H Atway, Rayann N, DO       insulin  aspart (novoLOG ) injection 0-9 Units  0-9 Units Subcutaneous TID WC Atway, Rayann N, DO       ondansetron  (ZOFRAN ) injection 4 mg  4 mg Intravenous Q6H PRN Atway, Rayann N, DO   4 mg at 04/16/23 2356   Oral care mouth rinse  15 mL Mouth Rinse PRN Atway, Rayann N, DO       oxyCODONE  (Oxy IR/ROXICODONE ) immediate release tablet 5 mg  5 mg Oral BID PRN Atway, Rayann N, DO   5 mg at 04/17/23 1345   pantoprazole  (PROTONIX ) EC tablet 40 mg  40 mg Oral Daily Atway, Rayann N, DO   40 mg at 04/18/23 0755   polyethylene glycol (MIRALAX  / GLYCOLAX ) packet 17 g  17 g Oral Daily PRN Atway, Rayann N, DO        Allergies as of 04/16/2023 - Review Complete 04/16/2023  Allergen Reaction Noted   Sumatriptan Anaphylaxis    Other  09/30/2019     Review of Systems:    Constitutional: No weight loss, fever or chills Skin: No rash  Cardiovascular: No chest pain   Respiratory: No SOB  Gastrointestinal: See HPI and otherwise negative Genitourinary: No dysuria  Neurological:See HPI Musculoskeletal: No new muscle or joint pain Hematologic: No bleeding Psychiatric: No history of depression or anxiety    Physical Exam:  Vital signs in last 24 hours: Temp:  [97.5 F (36.4 C)-98.3 F (36.8 C)] 97.5 F (36.4 C) (02/05 0743) Pulse Rate:  [68-93] 73 (02/05 0743) Resp:  [16-18] 16 (02/05 0743) BP: (83-95)/(50-62) 95/56 (02/05 0743) SpO2:  [98 %-100 %] 100 % (02/05 0743) Weight:  [76.3 kg] 76.3 kg (02/05 0500) Last BM Date  : 04/17/23 General:   Pleasant AA female appears to be in NAD, Well developed, Well nourished, alert and cooperative Head:  Normocephalic and atraumatic. Eyes:   PEERL, EOMI. No icterus. Conjunctiva pink. Ears:  Normal auditory acuity. Neck:  Supple Throat: Oral  cavity and pharynx without inflammation, swelling or lesion. Teeth in good condition. Lungs: Respirations even and unlabored. Lungs clear to auscultation bilaterally.   No wheezes, crackles, or rhonchi.  Heart: Normal S1, S2. No MRG. Regular rate and rhythm. No peripheral edema, cyanosis or pallor.  Abdomen:  Soft, nondistended, mild generalized TTP. No rebound or guarding. Normal bowel sounds. No appreciable masses or hepatomegaly. Rectal:  Not performed.  Msk:  Symmetrical without gross deformities. Peripheral pulses intact.  Extremities:  Without edema, no deformity or joint abnormality. Normal ROM, normal sensation. Neurologic:  Alert and  oriented x4;  grossly normal neurologically. CN II-XII intact.  Skin:   Dry and intact without significant lesions or rashes. Psychiatric: Demonstrates good judgement and reason without abnormal affect or behaviors.  LAB RESULTS: Recent Labs    04/16/23 2339 04/17/23 0424 04/18/23 0804  WBC 3.6* 3.2* 2.7*  HGB 9.4* 8.2* 8.7*  HCT 27.1* 23.9* 25.8*  PLT 127* 111* 108*   BMET Recent Labs    04/17/23 2107 04/17/23 2338 04/18/23 0804  NA 132* 131* 135  K 4.4 4.1 4.3  CL 104 102 104  CO2 21* 21* 24  GLUCOSE 109* 108* 97  BUN 29* 30* 25*  CREATININE 2.04* 1.94* 1.26*  CALCIUM  8.4* 8.4* 8.7*   LFT Recent Labs    04/16/23 1735  PROT 6.1*  ALBUMIN 3.7  AST 63*  ALT 12  ALKPHOS 102  BILITOT 0.6   PT/INR No results for input(s): LABPROT, INR in the last 72 hours.  STUDIES: ECHOCARDIOGRAM COMPLETE Result Date: 04/17/2023    ECHOCARDIOGRAM REPORT   Patient Name:   MELITZA METHENY Date of Exam: 04/17/2023 Medical Rec #:  [id number]      Height:       64.0 in Accession #:     [id number]     Weight:       164.0 lb Date of Birth:  [birth year]     BSA:          1.798 m Patient Age:    47 years       BP:           116/68 mmHg Patient Gender: F              HR:           71 bpm. Exam Location:  Inpatient Procedure: 2D Echo, Cardiac Doppler and Color Doppler Indications:    Shock  History:        Patient has prior history of Echocardiogram examinations, most                 recent 01/06/2022. Cardiomyopathy, Signs/Symptoms:Bacteremia and                 Dizziness/Lightheadedness; Risk Factors:Hypertension. ETOH.                 Substance abuse. Takastubo.  Sonographer:    Ellouise Mose RDCS Referring Phys: [id number] LAURA P CLARK IMPRESSIONS  1. Left ventricular ejection fraction, by estimation, is 60 to 65%. The left ventricle has normal function. The left ventricle has no regional wall motion abnormalities. Left ventricular diastolic parameters were normal.  2. Right ventricular systolic function is normal. The right ventricular size is normal. Tricuspid regurgitation signal is inadequate for assessing PA pressure.  3. The mitral valve is normal in structure. Mild mitral valve regurgitation. No evidence of mitral stenosis.  4. The aortic valve is normal in structure. Aortic valve regurgitation is not visualized. No  aortic stenosis is present.  5. The inferior vena cava is normal in size with greater than 50% respiratory variability, suggesting right atrial pressure of 3 mmHg. Comparison(s): Prior images reviewed side by side. The left ventricular function has improved. Conclusion(s)/Recommendation(s): No evidence of valvular vegetations on this transthoracic echocardiogram. Consider a transesophageal echocardiogram to exclude infective endocarditis if clinically indicated. FINDINGS  Left Ventricle: Left ventricular ejection fraction, by estimation, is 60 to 65%. The left ventricle has normal function. The left ventricle has no regional wall motion abnormalities. The left ventricular internal  cavity size was normal in size. There is  no left ventricular hypertrophy. Left ventricular diastolic parameters were normal. Right Ventricle: The right ventricular size is normal. No increase in right ventricular wall thickness. Right ventricular systolic function is normal. Tricuspid regurgitation signal is inadequate for assessing PA pressure. Left Atrium: Left atrial size was normal in size. Right Atrium: Right atrial size was normal in size. Pericardium: There is no evidence of pericardial effusion. Mitral Valve: The mitral valve is normal in structure. Mild mitral valve regurgitation. No evidence of mitral valve stenosis. Tricuspid Valve: The tricuspid valve is normal in structure. Tricuspid valve regurgitation is not demonstrated. No evidence of tricuspid stenosis. Aortic Valve: The aortic valve is normal in structure. Aortic valve regurgitation is not visualized. No aortic stenosis is present. Pulmonic Valve: The pulmonic valve was normal in structure. Pulmonic valve regurgitation is not visualized. No evidence of pulmonic stenosis. Aorta: The aortic root is normal in size and structure. Venous: The inferior vena cava is normal in size with greater than 50% respiratory variability, suggesting right atrial pressure of 3 mmHg. IAS/Shunts: No atrial level shunt detected by color flow Doppler.  LEFT VENTRICLE PLAX 2D LVIDd:         4.50 cm     Diastology LVIDs:         2.80 cm     LV e' medial:    12.70 cm/s LV PW:         1.00 cm     LV E/e' medial:  7.3 LV IVS:        0.90 cm     LV e' lateral:   16.30 cm/s LVOT diam:     2.10 cm     LV E/e' lateral: 5.7 LV SV:         81 LV SV Index:   45 LVOT Area:     3.46 cm  LV Volumes (MOD) LV vol d, MOD A2C: 78.4 ml LV vol d, MOD A4C: 48.6 ml LV vol s, MOD A2C: 28.4 ml LV vol s, MOD A4C: 23.1 ml LV SV MOD A2C:     50.0 ml LV SV MOD A4C:     48.6 ml LV SV MOD BP:      38.9 ml RIGHT VENTRICLE             IVC RV S prime:     14.00 cm/s  IVC diam: 1.60 cm TAPSE (M-mode):  2.4 cm LEFT ATRIUM             Index        RIGHT ATRIUM           Index LA diam:        2.90 cm 1.61 cm/m   RA Area:     10.30 cm LA Vol (A2C):   29.3 ml 16.29 ml/m  RA Volume:   21.30 ml  11.85 ml/m LA Vol (A4C):   25.2 ml  14.01 ml/m LA Biplane Vol: 28.6 ml 15.91 ml/m  AORTIC VALVE LVOT Vmax:   124.00 cm/s LVOT Vmean:  81.900 cm/s LVOT VTI:    0.233 m  AORTA Ao Root diam: 3.30 cm Ao Asc diam:  3.10 cm MITRAL VALVE MV Area (PHT): 4.31 cm    SHUNTS MV Decel Time: 176 msec    Systemic VTI:  0.23 m MV E velocity: 92.20 cm/s  Systemic Diam: 2.10 cm MV A velocity: 70.40 cm/s MV E/A ratio:  1.31 Mihai Croitoru MD Electronically signed by Jerel Balding MD Signature Date/Time: 04/17/2023/3:37:36 PM    Final    CT ABDOMEN PELVIS WO CONTRAST Result Date: 04/16/2023 CLINICAL DATA:  Sepsis. Body aches, fatigue, generalized weakness and diarrhea for 2 days. EXAM: CT ABDOMEN AND PELVIS WITHOUT CONTRAST TECHNIQUE: Multidetector CT imaging of the abdomen and pelvis was performed following the standard protocol without IV contrast. RADIATION DOSE REDUCTION: This exam was performed according to the departmental dose-optimization program which includes automated exposure control, adjustment of the mA and/or kV according to patient size and/or use of iterative reconstruction technique. COMPARISON:  01/11/2022 FINDINGS: Lower chest: The lung bases are clear of acute process. No pleural effusion or pulmonary lesions. The heart is normal in size. No pericardial effusion. The distal esophagus and aorta are unremarkable. Hepatobiliary: No focal liver abnormality is seen. No gallstones, gallbladder wall thickening, or biliary dilatation. Pancreas: Unremarkable. No pancreatic ductal dilatation or surrounding inflammatory changes. Spleen: Normal in size without focal abnormality. Adrenals/Urinary Tract: The adrenal glands and kidneys are unremarkable. No renal or obstructing ureteral calculi. No bladder calculi or bladder mass.  Stomach/Bowel: The stomach, duodenum, small bowel and colon are grossly normal without oral contrast. No inflammatory changes, mass lesions or obstructive findings. The appendix is normal. Vascular/Lymphatic: The aorta is normal in caliber. Minimal scattered atheroscerlotic calcifications. No mesenteric of retroperitoneal mass or adenopathy. Small scattered lymph nodes are noted. Reproductive: The uterus and ovaries are unremarkable. Other: Stable periumbilical and supraumbilical abdominal wall hernias containing fat. Musculoskeletal: No significant bony findings. IMPRESSION: 1. No acute abdominal/pelvic findings, mass lesions or adenopathy. 2. No renal or obstructing ureteral calculi. 3. Stable periumbilical and supraumbilical abdominal wall hernias containing fat. Electronically Signed   By: MYRTIS Stammer M.D.   On: 04/16/2023 20:47   DG Chest 2 View Result Date: 04/16/2023 CLINICAL DATA:  Hypotension.  Body aches. EXAM: CHEST - 2 VIEW COMPARISON:  Radiograph 01/10/2022, chest CT 01/06/2022 FINDINGS: The cardiomediastinal contours are normal. Vague ill-defined right lung base opacity. Pulmonary vasculature is normal. No pleural effusion or pneumothorax. No acute osseous abnormalities are seen. IMPRESSION: Vague ill-defined right lung base opacity may represent atelectasis/scarring or infection. Electronically Signed   By: Andrea Gasman M.D.   On: 04/16/2023 18:31    Impression / Plan:   Impression: 1.  GERD: With decreased p.o. intake, multiple GI workups in the past-last EGD in 2023, thought to likely be leading to dehydration on this admission, patient reports chronic symptoms since the 90s, is not on a high dose PPI and takes 20 mg Nexium as needed with her Famotidine , last EGD showed gastritis; likely gastritis +/- H. pylori 2.  Hyponatremia: Likely related to above 3.  AKI on CKD stage II: Creatinine improving 4.  History of Takotsubo cardiomyopathy 5.  Hypertension 6.  Anemia: suspected  nutritional deficiencies due to poor p.o. intake, initial workup in 2021 with EGD and colonoscopy which were unrevealing  Plan: 1.  Discussed EGD with the patient.  She would  like to proceed while she is here.  Will go ahead and set her up for tomorrow with Dr. Suzann.  Did discuss risks,  benefits, limitations and alternatives and patient agrees to proceed. 2.  Increased Pantoprazole  to 40 mg twice daily AC.  Discussed with patient that given the chronicity of her symptoms she will likely need to be on a high-dose PPI for a period of time at least.  I think this would keep her symptoms under better control going forward. 3.  Not sure what to make of her diarrhea, not sure this is reality, she tells me she tends to waver back-and-forth anyways and is only having 2 stools a day. 4.  Patient will be n.p.o. at midnight, can continue current diet until then.  Thank you for your kind consultation, we will continue to follow.  Delon Gibson Ozro Russett  04/18/2023, 1:37 PM

## 2023-04-18 NOTE — Progress Notes (Addendum)
 PROGRESS NOTE   Kristin [name]  FMW:[id number]    DOB: 1976/01/17    DOA: 04/16/2023  PCP: Rosendo Rush, MD   I have briefly reviewed patients previous medical records in [facility].  Chief Complaint  Patient presents with   Generalized Body Aches    Brief Hospital Course:  48 year old female, follows with PCP at [facility], medical history significant for HTN, bipolar disorder, previous Takotsubo cardiomyopathy with LVEF of 25-30%, ongoing alcohol and tobacco use, chronic GERD symptoms, presented to the ED on 04/16/2023 with complaints of lightheadedness, dizziness and feeling dehydrated.  She reports chronic almost daily morning reflux symptoms with vomiting on most mornings, variable/poor oral intake, fluctuating weights.  Famotidine  and Tums have not helped this chronically.  Hypotensive in the ED along with elevated lactate, AKI.  Admitted to ICU by PCCM with diagnosis of hyponatremia, hypotension, acute on chronic kidney disease.  Improved and transferred out to [facility] on 2/5.  Consulted [facility] for evaluation of her chronic reflux and vomiting issues which precipitated this current admission.  Also requested FM TS to assume attending role from 04/19/2023 (communicated with Dr. Izetta Nap who is agreeable).   Assessment & Plan:  Principal Problem:   Sepsis (HCC)   Dehydration with hyponatremia Presented with serum sodium of 126, likely related to poor oral intake and GI losses Treated with IV normal saline hydration with frequent monitoring of BMP Resolved.  At risk for recurrent dehydration due to chronic GERD symptoms and vomiting. Encouraged ad lib. p.o. fluid intake.  Hypotension/history of hypertension Suspected due to dehydration. Holding home lisinopril .  Ongoing soft blood pressures with SBP's in the 90s Check orthostatic vitals S/p 3 L IV fluid bolus and completed high-volume maintenance IV fluids. Received brief IV antibiotics due to concern for infectious etiology  but none found and antibiotics not continued.  Septic shock ruled out. Did not require vasopressors. CT A/P without contrast 2/3 without acute findings There was some concern for adrenal insufficiency, a.m. cortisol 6.7, indeterminate.  Urine pregnancy test negative.  Acute kidney injury complicating CKD stage II Creatinine of 1.2 range in August 2024.  Presented with creatinine of 3.85. Suspected due to dehydration and ATN not ruled out. Treated with IV fluids, held lisinopril , monitored BMP closely AKI resolved.  Creatinine has returned to baseline.  Chronic GERD/vomiting History as noted above. Last EGD in August 2024 by GI had shown acute gastritis Extensively counseled regarding cessation of alcohol, tobacco, NSAID use (does use some) PPI [facility] consulted to see if she warrants an EGD.  Alcohol and tobacco use Patient not very forthcoming regarding amount of alcohol intake.  Indicates that she maybe drinks a beer every day after work and then undetermined amount of liquor on weekends. Reports smoking a pack of cigarettes per week Counseled extensively regarding absolute cessation of both and she verbalizes understanding.  Advised to consider nicotine  patch.  History of Takotsubo cardiomyopathy Last echo in 2023 showed LVEF of 25-30%, severely decreased LV function and indeterminate LV diastolic dysfunction Repeat 2D echo 2/4: LVEF 60-65% with normal LV diastolic parameters.  No aortic stenosis. Improved  Pancytopenia Hemoglobin and August 24 was 12.  Initially presented this admission with hemoglobin of 11.1 which has gradually drifted down to 8.7 in the absence of overt bleeding.   Also noted new leukopenia and thrombocytopenia of unclear etiology. Anemia panel appreciated: Iron 88, TIBC 144, saturation ratio 61, ferritin 317, folate 5 and B12: 1014.  Replace folate Trend daily CBC  and transfuse if hemoglobin 7 g or less.  Check FOBT.  Diarrhea Appears to have  resolved.  Vague ill-defined right lung base opacity noted on chest x-ray 2/3 No clinical concern for pneumonia Will repeat two-view chest x-ray and if this persist then may need either follow-up chest x-ray in 4 weeks or consideration for CT chest without contrast.  Body mass index is 28.87 kg/m.   DVT prophylaxis: heparin  injection 5,000 Units Start: 04/17/23 0600     Code Status: Full Code:  Family Communication: None at bedside Disposition:  Status is: Inpatient Remains inpatient appropriate because: Pending GI consultation for EGD to evaluate her chronic and severe GERD symptoms including vomiting.     Consultants:   PCCM-signed off [facility]  Procedures:     Antimicrobials:      Subjective:  History as noted above.  Reports several years/chronic history of almost waking up every morning with heartburn and almost every day vomiting per her report.  Unclear if she has vomiting during the rest of the day.  Variable oral intake and fluctuating weights, up-and-down palpation.  Tobacco and alcohol use history as above.  Does volunteer to using some NSAIDs.  Denies abdominal pain.  No diarrhea.  Denies hematemesis or melena.  Objective:   Vitals:   04/18/23 0500 04/18/23 0512 04/18/23 0743 04/18/23 1255  BP:  91/62 (!) 95/56   Pulse:  68 73   Resp:  18 16   Temp:  97.8 F (36.6 C) (!) 97.5 F (36.4 C)   TempSrc:  Oral Oral   SpO2:  98% 100%   Weight: 76.3 kg     Height:    5' 4 (1.626 m)    General exam: Young female, moderately built and nourished lying comfortably propped up in bed without distress.  Oral mucosa moist. Respiratory system: Clear to auscultation. Respiratory effort normal. Cardiovascular system: S1 & S2 heard, RRR. No JVD, murmurs, rubs, gallops or clicks. No pedal edema.  Not on telemetry. Gastrointestinal system: Abdomen is nondistended, soft and nontender. No organomegaly or masses felt. Normal bowel sounds heard. Central nervous system: Alert  and oriented. No focal neurological deficits. Extremities: Symmetric 5 x 5 power. Skin: No rashes, lesions or ulcers Psychiatry: Judgement and insight appear normal. Mood & affect appropriate.     Data Reviewed:   I have personally reviewed following labs and imaging studies   CBC: Recent Labs  Lab 04/16/23 1735 04/16/23 2339 04/17/23 0424 04/18/23 0804  WBC 4.2 3.6* 3.2* 2.7*  NEUTROABS 2.1  --   --   --   HGB 11.1* 9.4* 8.2* 8.7*  HCT 32.2* 27.1* 23.9* 25.8*  MCV 91.0 91.2 91.6 94.2  PLT 156 127* 111* 108*    Basic Metabolic Panel: Recent Labs  Lab 04/17/23 0424 04/17/23 0657 04/17/23 1133 04/17/23 2107 04/17/23 2338 04/18/23 0804  NA 129* 128* 130* 132* 131* 135  K 4.0 4.0 3.9 4.4 4.1 4.3  CL 100 97* 99 104 102 104  CO2 20* 21* 21* 21* 21* 24  GLUCOSE 100* 93 105* 109* 108* 97  BUN 32* 32* 26* 29* 30* 25*  CREATININE 2.61* 2.19* 1.88* 2.04* 1.94* 1.26*  CALCIUM  7.6* 7.7* 8.1* 8.4* 8.4* 8.7*  MG 1.7  --   --   --   --   --   PHOS 4.5  --   --   --   --   --     Liver Function Tests: Recent Labs  Lab 04/16/23 1735  AST 63*  ALT 12  ALKPHOS 102  BILITOT 0.6  PROT 6.1*  ALBUMIN 3.7    CBG: Recent Labs  Lab 04/17/23 2021 04/18/23 0814 04/18/23 1157  GLUCAP 102* 91 134*    Microbiology Studies:   Recent Results (from the past 240 hours)  Resp panel by RT-PCR (RSV, Flu A&B, Covid) Anterior Nasal Swab     Status: None   Collection Time: 04/16/23  5:36 PM   Specimen: Anterior Nasal Swab  Result Value Ref Range Status   SARS Coronavirus 2 by RT PCR NEGATIVE NEGATIVE Final    Comment: (NOTE) SARS-CoV-2 target nucleic acids are NOT DETECTED.  The SARS-CoV-2 RNA is generally detectable in upper respiratory specimens during the acute phase of infection. The lowest concentration of SARS-CoV-2 viral copies this assay can detect is 138 copies/mL. A negative result does not preclude SARS-Cov-2 infection and should not be used as the sole basis for  treatment or other patient management decisions. A negative result may occur with  improper specimen collection/handling, submission of specimen other than nasopharyngeal swab, presence of viral mutation(s) within the areas targeted by this assay, and inadequate number of viral copies(<138 copies/mL). A negative result must be combined with clinical observations, patient history, and epidemiological information. The expected result is Negative.  Fact Sheet for Patients:  bloggercourse.com  Fact Sheet for Healthcare Providers:  seriousbroker.it  This test is no t yet approved or cleared by the [place]  FDA and  has been authorized for detection and/or diagnosis of SARS-CoV-2 by FDA under an Emergency Use Authorization (EUA). This EUA will remain  in effect (meaning this test can be used) for the duration of the COVID-19 declaration under Section 564(b)(1) of the Act, 21 U.S.C.section 360bbb-3(b)(1), unless the authorization is terminated  or revoked sooner.       Influenza A by PCR NEGATIVE NEGATIVE Final   Influenza B by PCR NEGATIVE NEGATIVE Final    Comment: (NOTE) The Xpert Xpress SARS-CoV-2/FLU/RSV plus assay is intended as an aid in the diagnosis of influenza from Nasopharyngeal swab specimens and should not be used as a sole basis for treatment. Nasal washings and aspirates are unacceptable for Xpert Xpress SARS-CoV-2/FLU/RSV testing.  Fact Sheet for Patients: bloggercourse.com  Fact Sheet for Healthcare Providers: seriousbroker.it  This test is not yet approved or cleared by the [place]  FDA and has been authorized for detection and/or diagnosis of SARS-CoV-2 by FDA under an Emergency Use Authorization (EUA). This EUA will remain in effect (meaning this test can be used) for the duration of the COVID-19 declaration under Section 564(b)(1) of the Act, 21  U.S.C. section 360bbb-3(b)(1), unless the authorization is terminated or revoked.     Resp Syncytial Virus by PCR NEGATIVE NEGATIVE Final    Comment: (NOTE) Fact Sheet for Patients: bloggercourse.com  Fact Sheet for Healthcare Providers: seriousbroker.it  This test is not yet approved or cleared by the [place]  FDA and has been authorized for detection and/or diagnosis of SARS-CoV-2 by FDA under an Emergency Use Authorization (EUA). This EUA will remain in effect (meaning this test can be used) for the duration of the COVID-19 declaration under Section 564(b)(1) of the Act, 21 U.S.C. section 360bbb-3(b)(1), unless the authorization is terminated or revoked.  Performed at [facility], [address] [place], [place] [address]   Urine Culture     Status: Abnormal   Collection Time: 04/16/23  5:47 PM   Specimen: Urine, Random  Result Value Ref Range Status  Specimen Description   Final    URINE, RANDOM Performed at [facility], [address] [place], [place] [address]    Special Requests   Final    NONE Reflexed from [id number] Performed at [facility], [address], [place], [place] [address]    Culture (A)  Final    <10,000 COLONIES/mL INSIGNIFICANT GROWTH Performed at [facility], [address], [place], [place] [address]    Report Status 04/18/2023 FINAL  Final  Culture, blood (single)     Status: None (Preliminary result)   Collection Time: 04/16/23  7:49 PM   Specimen: BLOOD LEFT ARM  Result Value Ref Range Status   Specimen Description   Final    BLOOD LEFT ARM Performed at [facility], [address], [place], [place] [address]    Special Requests   Final    BOTTLES DRAWN AEROBIC AND ANAEROBIC Blood Culture results may not be optimal due to an inadequate volume of blood received in culture bottles Performed at [facility], [address], [place], [place] [address]    Culture   Final    NO GROWTH < 24 HOURS Performed at [facility], [address], [place], [place] [address]    Report Status PENDING  Incomplete  MRSA Next Gen by PCR, Nasal     Status: None   Collection Time: 04/16/23 11:02 PM   Specimen: Nasal Mucosa; Nasal Swab  Result Value Ref Range Status   MRSA by PCR Next Gen NOT DETECTED NOT DETECTED Final    Comment: (NOTE) The GeneXpert MRSA Assay (FDA approved for NASAL specimens only), is one component of a comprehensive MRSA colonization surveillance program. It is not intended to diagnose MRSA infection nor to guide or monitor treatment for MRSA infections. Test performance is not FDA approved in patients less than 54 years old. Performed at [facility], [address], [place], [place] [address]     Radiology Studies:  ECHOCARDIOGRAM COMPLETE Result Date: 04/17/2023    ECHOCARDIOGRAM REPORT   Patient Name:   SKY PRIMO Date of Exam: 04/17/2023 Medical Rec #:  [id number]      Height:       64.0 in Accession #:    [id number]     Weight:       164.0 lb Date of Birth:  04/10/1975     BSA:          1.798 m Patient Age:    47 years       BP:           116/68 mmHg Patient Gender: F              HR:           71 bpm. Exam Location:  Inpatient Procedure: 2D Echo, Cardiac Doppler and Color Doppler Indications:    Shock  History:        Patient has prior history of Echocardiogram examinations, most                 recent 01/06/2022. Cardiomyopathy, Signs/Symptoms:Bacteremia and                 Dizziness/Lightheadedness; Risk Factors:Hypertension. ETOH.                 Substance abuse. Takastubo.  Sonographer:    Ellouise Mose RDCS Referring Phys: [id number] LAURA P CLARK IMPRESSIONS  1. Left ventricular ejection fraction, by estimation, is  60 to 65%. The left ventricle has normal function. The left ventricle has no regional wall motion abnormalities. Left ventricular  diastolic parameters were normal.  2. Right ventricular systolic function is normal. The right ventricular size is normal. Tricuspid regurgitation signal is inadequate for assessing PA pressure.  3. The mitral valve is normal in structure. Mild mitral valve regurgitation. No evidence of mitral stenosis.  4. The aortic valve is normal in structure. Aortic valve regurgitation is not visualized. No aortic stenosis is present.  5. The inferior vena cava is normal in size with greater than 50% respiratory variability, suggesting right atrial pressure of 3 mmHg. Comparison(s): Prior images reviewed side by side. The left ventricular function has improved. Conclusion(s)/Recommendation(s): No evidence of valvular vegetations on this transthoracic echocardiogram. Consider a transesophageal echocardiogram to exclude infective endocarditis if clinically indicated. FINDINGS  Left Ventricle: Left ventricular ejection fraction, by estimation, is 60 to 65%. The left ventricle has normal function. The left ventricle has no regional wall motion abnormalities. The left ventricular internal cavity size was normal in size. There is  no left ventricular hypertrophy. Left ventricular diastolic parameters were normal. Right Ventricle: The right ventricular size is normal. No increase in right ventricular wall thickness. Right ventricular systolic function is normal. Tricuspid regurgitation signal is inadequate for assessing PA pressure. Left Atrium: Left atrial size was normal in size. Right Atrium: Right atrial size was normal in size. Pericardium: There is no evidence of pericardial effusion. Mitral Valve: The mitral valve is normal in structure. Mild mitral valve regurgitation. No evidence of mitral valve stenosis. Tricuspid Valve: The tricuspid valve is normal in structure. Tricuspid valve regurgitation is not demonstrated. No evidence of tricuspid stenosis. Aortic Valve: The aortic valve is normal in structure. Aortic valve  regurgitation is not visualized. No aortic stenosis is present. Pulmonic Valve: The pulmonic valve was normal in structure. Pulmonic valve regurgitation is not visualized. No evidence of pulmonic stenosis. Aorta: The aortic root is normal in size and structure. Venous: The inferior vena cava is normal in size with greater than 50% respiratory variability, suggesting right atrial pressure of 3 mmHg. IAS/Shunts: No atrial level shunt detected by color flow Doppler.  LEFT VENTRICLE PLAX 2D LVIDd:         4.50 cm     Diastology LVIDs:         2.80 cm     LV e' medial:    12.70 cm/s LV PW:         1.00 cm     LV E/e' medial:  7.3 LV IVS:        0.90 cm     LV e' lateral:   16.30 cm/s LVOT diam:     2.10 cm     LV E/e' lateral: 5.7 LV SV:         81 LV SV Index:   45 LVOT Area:     3.46 cm  LV Volumes (MOD) LV vol d, MOD A2C: 78.4 ml LV vol d, MOD A4C: 48.6 ml LV vol s, MOD A2C: 28.4 ml LV vol s, MOD A4C: 23.1 ml LV SV MOD A2C:     50.0 ml LV SV MOD A4C:     48.6 ml LV SV MOD BP:      38.9 ml RIGHT VENTRICLE             IVC RV S prime:     14.00 cm/s  IVC diam: 1.60 cm TAPSE (M-mode): 2.4 cm LEFT ATRIUM  Index        RIGHT ATRIUM           Index LA diam:        2.90 cm 1.61 cm/m   RA Area:     10.30 cm LA Vol (A2C):   29.3 ml 16.29 ml/m  RA Volume:   21.30 ml  11.85 ml/m LA Vol (A4C):   25.2 ml 14.01 ml/m LA Biplane Vol: 28.6 ml 15.91 ml/m  AORTIC VALVE LVOT Vmax:   124.00 cm/s LVOT Vmean:  81.900 cm/s LVOT VTI:    0.233 m  AORTA Ao Root diam: 3.30 cm Ao Asc diam:  3.10 cm MITRAL VALVE MV Area (PHT): 4.31 cm    SHUNTS MV Decel Time: 176 msec    Systemic VTI:  0.23 m MV E velocity: 92.20 cm/s  Systemic Diam: 2.10 cm MV A velocity: 70.40 cm/s MV E/A ratio:  1.31 Mihai Croitoru MD Electronically signed by Jerel Balding MD Signature Date/Time: 04/17/2023/3:37:36 PM    Final    CT ABDOMEN PELVIS WO CONTRAST Result Date: 04/16/2023 CLINICAL DATA:  Sepsis. Body aches, fatigue, generalized weakness and  diarrhea for 2 days. EXAM: CT ABDOMEN AND PELVIS WITHOUT CONTRAST TECHNIQUE: Multidetector CT imaging of the abdomen and pelvis was performed following the standard protocol without IV contrast. RADIATION DOSE REDUCTION: This exam was performed according to the departmental dose-optimization program which includes automated exposure control, adjustment of the mA and/or kV according to patient size and/or use of iterative reconstruction technique. COMPARISON:  01/11/2022 FINDINGS: Lower chest: The lung bases are clear of acute process. No pleural effusion or pulmonary lesions. The heart is normal in size. No pericardial effusion. The distal esophagus and aorta are unremarkable. Hepatobiliary: No focal liver abnormality is seen. No gallstones, gallbladder wall thickening, or biliary dilatation. Pancreas: Unremarkable. No pancreatic ductal dilatation or surrounding inflammatory changes. Spleen: Normal in size without focal abnormality. Adrenals/Urinary Tract: The adrenal glands and kidneys are unremarkable. No renal or obstructing ureteral calculi. No bladder calculi or bladder mass. Stomach/Bowel: The stomach, duodenum, small bowel and colon are grossly normal without oral contrast. No inflammatory changes, mass lesions or obstructive findings. The appendix is normal. Vascular/Lymphatic: The aorta is normal in caliber. Minimal scattered atheroscerlotic calcifications. No mesenteric of retroperitoneal mass or adenopathy. Small scattered lymph nodes are noted. Reproductive: The uterus and ovaries are unremarkable. Other: Stable periumbilical and supraumbilical abdominal wall hernias containing fat. Musculoskeletal: No significant bony findings. IMPRESSION: 1. No acute abdominal/pelvic findings, mass lesions or adenopathy. 2. No renal or obstructing ureteral calculi. 3. Stable periumbilical and supraumbilical abdominal wall hernias containing fat. Electronically Signed   By: MYRTIS Stammer M.D.   On: 04/16/2023 20:47    DG Chest 2 View Result Date: 04/16/2023 CLINICAL DATA:  Hypotension.  Body aches. EXAM: CHEST - 2 VIEW COMPARISON:  Radiograph 01/10/2022, chest CT 01/06/2022 FINDINGS: The cardiomediastinal contours are normal. Vague ill-defined right lung base opacity. Pulmonary vasculature is normal. No pleural effusion or pneumothorax. No acute osseous abnormalities are seen. IMPRESSION: Vague ill-defined right lung base opacity may represent atelectasis/scarring or infection. Electronically Signed   By: Andrea Gasman M.D.   On: 04/16/2023 18:31    Scheduled Meds:    Chlorhexidine  Gluconate Cloth  6 each Topical Daily   heparin   5,000 Units Subcutaneous Q8H   insulin  aspart  0-9 Units Subcutaneous TID WC   pantoprazole   40 mg Oral Daily    Continuous Infusions:     LOS: 2 days  Trenda Mar, MD,  FACP, [facility], [facility], [facility]   [facility] [place]      To contact the attending provider between 7A-7P or the covering provider during after hours 7P-7A, please log into the web site [email or web address] and access using universal [place] password for that web site. If you do not have the password, please call the hospital operator.  04/18/2023, 1:24 PM

## 2023-04-18 NOTE — Plan of Care (Signed)

## 2023-04-18 NOTE — Hospital Course (Addendum)
 Kristin [name] is a 48 y.o.female with a history of HTN, Bipolar disorder, previous takotsubo cardiomyopathy with LVEF of 25-30% and severe GERD who was transferred from the ICU to the [facility] Medicine Teaching Service at [facility] for lightheadedness, dizziness, and dehydration.Kristin [name] Her hospital course is detailed below:  Dehydration with hyponatremia Hypotension AKI Patient initially hypotensive in the ED and fluid resuscitated. Despite fluid resuscitation, patient continued to have low Bps and was thus admitted to ICU. In the ED, found to have hyponatremia with Na at 126. Lactic acid also elevated to 3.8 which only minimally downtrended to 3. Patient was treated with mIVF and did not require pressors to normalize BP. Hyponatremia and Cr continued to improve with sodium containing fluids. Patient does have history of hypertension, all medications were held during admission. At time of discharge her electrolytes had normalized and her AKI was improving. She was also able to tolerate adequate PO intake with no further concerns for vomiting.   Hx of Takatosubo cardiomyopathy Repeat Echo showed normal EF of 60-65%, improved from prior echo. Otherwise normal echo.   Chronic GERD/ Eosinophilic Esophagitis GI was consulted given history of chronic GERD with reflux symptoms and vomiting causing poor PO intake, likely contributing to clinical picture. PPI was increased to BID dosing. EGD was done which showed eosinophilic esophagitis.  Biopsy was taken and sent for pathologist review. At time of discharge, GI recommended outpatient follow up.  Pancytopenia Patient initially admitted with Hg of 11.1 which downtrended to 8.7 in absence of overt bleeding. Found to be folate deficient. New leukopenia and thrombocytopenia of unclear etiology also noted. Continued to trend CBC which showed stable cell counts.  Smear was done which showed no morphologic abnormalities.  Most likely hemodilution all in the setting of  aggressive fluid septation and chronic poor p.o. intake. Recommend outpatient follow up.     Other chronic conditions were medically managed with home medications and formulary alternatives as necessary   PCP Follow-up Recommendations: Consider counseling on alcohol and tobacco use Follow-up pancytopenia outpatient Trend kidney function for possible underlying CKD vs. Repeated AKI Ensure follow up with GI

## 2023-04-18 NOTE — Progress Notes (Signed)
Refuse am labs.

## 2023-04-18 NOTE — Plan of Care (Signed)
 Messaged by Dr. Sherrod [facility]) requesting transfer to [facility] service given patient is seen at [facility].  FMTS will assume patient care on 2/6 at 7 AM.   Jonne Netters, MD 04/18/2023, 2:57 PM PGY-2, [facility] Family Medicine Service pager [phone]

## 2023-04-19 ENCOUNTER — Encounter (HOSPITAL_COMMUNITY)
Admission: EM | Disposition: A | Discharge status: Home / Self Care | Payer: Self-pay | Source: Home / Self Care | Attending: Family Medicine

## 2023-04-19 ENCOUNTER — Inpatient Hospital Stay (HOSPITAL_COMMUNITY): Payer: Medicaid Other | Admitting: Anesthesiology

## 2023-04-19 ENCOUNTER — Encounter (HOSPITAL_COMMUNITY): Payer: Self-pay | Admitting: Pediatrics

## 2023-04-19 DIAGNOSIS — R1115 Cyclical vomiting syndrome unrelated to migraine: Secondary | ICD-10-CM

## 2023-04-19 DIAGNOSIS — E861 Hypovolemia: Secondary | ICD-10-CM | POA: Diagnosis not present

## 2023-04-19 DIAGNOSIS — R112 Nausea with vomiting, unspecified: Secondary | ICD-10-CM

## 2023-04-19 DIAGNOSIS — K2100 Gastro-esophageal reflux disease with esophagitis, without bleeding: Secondary | ICD-10-CM | POA: Diagnosis not present

## 2023-04-19 DIAGNOSIS — R1013 Epigastric pain: Secondary | ICD-10-CM

## 2023-04-19 DIAGNOSIS — K219 Gastro-esophageal reflux disease without esophagitis: Secondary | ICD-10-CM | POA: Diagnosis not present

## 2023-04-19 DIAGNOSIS — K3189 Other diseases of stomach and duodenum: Secondary | ICD-10-CM | POA: Diagnosis not present

## 2023-04-19 DIAGNOSIS — R9389 Abnormal findings on diagnostic imaging of other specified body structures: Secondary | ICD-10-CM

## 2023-04-19 HISTORY — PX: BIOPSY: SHX5522

## 2023-04-19 HISTORY — PX: ESOPHAGOGASTRODUODENOSCOPY (EGD) WITH PROPOFOL: SHX5813

## 2023-04-19 LAB — CBC WITH DIFFERENTIAL/PLATELET
Abs Immature Granulocytes: 0.01 10*3/uL (ref 0.00–0.07)
Basophils Absolute: 0.1 10*3/uL (ref 0.0–0.1)
Basophils Relative: 2 %
Eosinophils Absolute: 0.3 10*3/uL (ref 0.0–0.5)
Eosinophils Relative: 9 %
HCT: 23.7 % — ABNORMAL LOW (ref 36.0–46.0)
Hemoglobin: 7.9 g/dL — ABNORMAL LOW (ref 12.0–15.0)
Immature Granulocytes: 0 %
Lymphocytes Relative: 39 %
Lymphs Abs: 1.2 10*3/uL (ref 0.7–4.0)
MCH: 31.3 pg (ref 26.0–34.0)
MCHC: 33.3 g/dL (ref 30.0–36.0)
MCV: 94 fL (ref 80.0–100.0)
Monocytes Absolute: 0.5 10*3/uL (ref 0.1–1.0)
Monocytes Relative: 16 %
Neutro Abs: 1 10*3/uL — ABNORMAL LOW (ref 1.7–7.7)
Neutrophils Relative %: 34 %
Platelets: 103 10*3/uL — ABNORMAL LOW (ref 150–400)
RBC: 2.52 MIL/uL — ABNORMAL LOW (ref 3.87–5.11)
RDW: 13.8 % (ref 11.5–15.5)
WBC: 3.1 10*3/uL — ABNORMAL LOW (ref 4.0–10.5)
nRBC: 0 % (ref 0.0–0.2)

## 2023-04-19 LAB — COMPREHENSIVE METABOLIC PANEL
ALT: 15 U/L (ref 0–44)
AST: 45 U/L — ABNORMAL HIGH (ref 15–41)
Albumin: 2.4 g/dL — ABNORMAL LOW (ref 3.5–5.0)
Alkaline Phosphatase: 58 U/L (ref 38–126)
Anion gap: 6 (ref 5–15)
BUN: 20 mg/dL (ref 6–20)
CO2: 24 mmol/L (ref 22–32)
Calcium: 8.3 mg/dL — ABNORMAL LOW (ref 8.9–10.3)
Chloride: 104 mmol/L (ref 98–111)
Creatinine, Ser: 1.39 mg/dL — ABNORMAL HIGH (ref 0.44–1.00)
GFR, Estimated: 47 mL/min — ABNORMAL LOW (ref >60)
Glucose, Bld: 100 mg/dL — ABNORMAL HIGH (ref 70–99)
Potassium: 3.8 mmol/L (ref 3.5–5.1)
Sodium: 134 mmol/L — ABNORMAL LOW (ref 135–145)
Total Bilirubin: 0.5 mg/dL (ref 0.0–1.2)
Total Protein: 4.4 g/dL — ABNORMAL LOW (ref 6.5–8.1)

## 2023-04-19 SURGERY — ESOPHAGOGASTRODUODENOSCOPY (EGD) WITH PROPOFOL
Anesthesia: Monitor Anesthesia Care

## 2023-04-19 MED ORDER — SODIUM CHLORIDE 0.9 % IV SOLN: INTRAVENOUS | Status: DC | PRN

## 2023-04-19 MED ORDER — ENOXAPARIN SODIUM 40 MG/0.4ML IJ SOSY: 40.0000 mg | PREFILLED_SYRINGE | INTRAMUSCULAR | Status: DC

## 2023-04-19 MED ORDER — PROPOFOL 10 MG/ML IV BOLUS
INTRAVENOUS | Status: DC | PRN
  Administered 2023-04-19: 70 mg via INTRAVENOUS
  Administered 2023-04-19: 40 mg via INTRAVENOUS
  Administered 2023-04-19: 50 mg via INTRAVENOUS
  Administered 2023-04-19: 20 mg via INTRAVENOUS
  Administered 2023-04-19: 80 mg via INTRAVENOUS
  Administered 2023-04-19: 60 mg via INTRAVENOUS

## 2023-04-19 MED ORDER — LIDOCAINE 2% (20 MG/ML) 5 ML SYRINGE
INTRAMUSCULAR | Status: DC | PRN
  Administered 2023-04-19: 40 mg via INTRAVENOUS

## 2023-04-19 SURGICAL SUPPLY — 14 items

## 2023-04-19 NOTE — Progress Notes (Signed)
 Pt presents to the endoscopy suite today for EGD with MD McGreal. Pt does have jewelry/ piercings. Pt elects not to remove jewelry for procedure. Pt educated regarding risks of wearing jewelry during procedure. Demonstrates understanding of risks, states she has done this before without removing jewelry and elects to proceed without removing jewelry.  Ozell VEAR Pouch, RN 04/19/23 8:50 AM

## 2023-04-19 NOTE — Progress Notes (Signed)
 Daily Progress Note Intern Pager: [phone]  Patient name: Kristin [name] Medical record number: [id number] Date of birth: 11/20/1975 Age: 48 y.o. Gender: female  Primary Care Provider: Rosendo Rush, MD Consultants: Gastroenterology Code Status: Full  Pt Overview and Major Events to Date:  2/3- admitted to ICU 2/5- Patient transferred to the floor 2/6- FMTS assumed care of the patient  Assessment and Plan:  Kristin [name] is a 48 year old female with a past medical history of hypertension, bipolar disorder, Takotsubo's cardiomyopathy with LVEF of 25 to 30% and chronic GERD symptoms who was initially admitted to the ICU for hyponatremia, hypotension and profound AKI.  She is now stable for care on the floor. Assessment & Plan Gastroesophageal reflux disease This is a longstanding problem for which she has been seen many times.  GI was consulted yesterday by [facility] before FM TS assumed care.  Her severe symptoms are believed to have led to profound dehydration which caused her electrolyte disturbances and AKI.  Their recommendations are listed below, appreciate their care of this patient. -Plan for EGD today -Follow-up GI recs -Increase daily p.o. Protonix  to 40 mg twice daily -Zofran  4 mg as needed Anemia Since hemoglobin dropped from 11.1 on admission to 8.7 yesterday, today's labs have not yet resulted.  Most recent outpatient labs normal hemoglobin of 12.0.  -Iron studies to assess for deficiency -AM CBC with peripheral smear AKI (acute kidney injury) (HCC) Certain whether patient's current creatinine elevation is related to chronic underlying kidney disease or represents an AKI related to her eating and poor p.o. intake.  She is not diagnosed with chronic kidney disease anywhere that I can see in her chart but may meet criteria.  Will continue to monitor and if after her scope today she is unable to take adequate p.o. we will consider a small bolus.  Chronic and  Stable Problems:  HTN: Holding home antihypertensives in setting of hypotension, will resume as able Takusubo cardiomyopathy with HFrEF: Not currently on GDMT, most recent echo 2/4 showed LVEF of 60 to 65%  FEN/GI: Regular diet following procedure. PPx: Lovenox  Dispo:Home pending clinical improvement .   Subjective:  Patient was seen following her EGD this morning.  She is awake and feeling well.  Her major concern is when she can go home and we discussed that she would likely be here at least another day while we evaluate her kidneys and her anemia.  Objective: Temp:  [97.5 F (36.4 C)-98.1 F (36.7 C)] 98.1 F (36.7 C) (02/06 0458) Pulse Rate:  [73-86] 75 (02/06 0458) Resp:  [16-18] 18 (02/06 0458) BP: (85-100)/(55-65) 85/55 (02/06 0458) SpO2:  [100 %] 100 % (02/06 0458) Physical Exam: General: Well-appearing, well-nourished female no distress Cardiovascular: RRR, no M/R/G Respiratory: CTAB, no increased work of breathing Abdomen: Flat, soft, nontender.  Bowel sounds present x 4 Extremities: 2+ peripheral pulses in all 4 extremities  Laboratory: Most recent CBC Lab Results  Component Value Date   WBC 2.7 (L) 04/18/2023   HGB 8.7 (L) 04/18/2023   HCT 25.8 (L) 04/18/2023   MCV 94.2 04/18/2023   PLT 108 (L) 04/18/2023   Most recent BMP    Latest Ref Rng & Units 04/18/2023    8:04 AM  BMP  Glucose 70 - 99 mg/dL 97   BUN 6 - 20 mg/dL 25   Creatinine 9.55 - 1.00 mg/dL 8.73   Sodium 864 - 854 mmol/L 135   Potassium 3.5 - 5.1 mmol/L 4.3  Chloride 98 - 111 mmol/L 104   CO2 22 - 32 mmol/L 24   Calcium  8.9 - 10.3 mg/dL 8.7     Kristin Lukes, DO 04/19/2023, 7:18 AM  PGY-1, [facility] FPTS Intern pager: [phone], text pages welcome Secure chat group [facility] Teaching Service

## 2023-04-19 NOTE — Progress Notes (Signed)
 [facility] signoff note   The care of this patient has been transferred from the [facility] service to the following service:  Service: FMTS Attending: Dr. Camie Mulch. I have discussed with Dr. Theophilus Date & time: 04/19/2023, 3:21 PM  Chart reviewed Reviewed chest x-ray ordered by me on 2/5. Reported as, interval resolution of previous right lung base opacities.  No evidence of active pulmonary disease FMTS has taken over primary role.  Please re consult the [facility] team for any further assistance.  Thank you   [employer]. MD GENI COON, [facility], [facility]   [facility] Hospitalist & Physician Advisor [place]     To contact a [facility] provider, please log into the web site [email or web address] and access using universal [place] password for that web site. If you do not have the password, please call the hospital operator. Page [facility] Admits & Consults listed on top of the page. Provide a number where you can be directly reached.

## 2023-04-19 NOTE — Assessment & Plan Note (Addendum)
 Since hemoglobin dropped from 11.1 on admission to 8.7 yesterday, today's labs have not yet resulted.  Most recent outpatient labs normal hemoglobin of 12.0.  -Iron studies to assess for deficiency -AM CBC with peripheral smear

## 2023-04-19 NOTE — Assessment & Plan Note (Addendum)
 This is a longstanding problem for which she has been seen many times.  GI was consulted yesterday by [facility] hospitalist before FM TS assumed care.  Her severe symptoms are believed to have led to profound dehydration which caused her electrolyte disturbances and AKI.  Their recommendations are listed below, appreciate their care of this patient. -Plan for EGD today -Follow-up GI recs -Increase daily p.o. Protonix  to 40 mg twice daily -Zofran  4 mg as needed
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
## 2023-04-19 NOTE — Transfer of Care (Signed)
 Immediate Anesthesia Transfer of Care Note  [name]  Procedure(s) Performed: ESOPHAGOGASTRODUODENOSCOPY (EGD) WITH PROPOFOL  BIOPSY  Patient Location: PACU and Endoscopy Unit  Anesthesia Type:MAC  Level of Consciousness: awake, oriented, and patient cooperative  Airway & Oxygen Therapy: Patient Spontanous Breathing and Patient connected to nasal cannula oxygen  Post-op Assessment: Report given to RN, Post -op Vital signs reviewed and stable, and Patient moving all extremities X 4  Post vital signs: Reviewed and stable  Last Vitals:  Vitals Value Taken Time  BP 101/68 04/19/23 0904  Temp 97.4   Pulse 96 04/19/23 0906  Resp 12 04/19/23 0906  SpO2 100 % 04/19/23 0906  Vitals shown include unfiled device data.  Last Pain:  Vitals:   04/19/23 0816  TempSrc: Temporal  PainSc:       Patients Stated Pain Goal: 3 (04/17/23 1343)  Complications: No notable events documented.

## 2023-04-19 NOTE — Anesthesia Preprocedure Evaluation (Signed)
 Anesthesia Evaluation  Patient identified by MRN, date of birth, ID band Patient awake    Reviewed: Allergy & Precautions, NPO status , Patient's Chart, lab work & pertinent test results  Airway Mallampati: III  TM Distance: >3 FB Neck ROM: Full    Dental  (+) Teeth Intact, Dental Advisory Given   Pulmonary Current Smoker and Patient abstained from smoking.   breath sounds clear to auscultation       Cardiovascular hypertension, Pt. on medications  Rhythm:Regular Rate:Normal     Neuro/Psych  Headaches PSYCHIATRIC DISORDERS Anxiety Depression Bipolar Disorder      GI/Hepatic Neg liver ROS,GERD  Medicated,,  Endo/Other  negative endocrine ROS    Renal/GU Renal disease     Musculoskeletal  (+) Arthritis , Osteoarthritis,    Abdominal Normal abdominal exam  (+)   Peds  Hematology  (+) Blood dyscrasia, anemia   Anesthesia Other Findings   Reproductive/Obstetrics                             Anesthesia Physical Anesthesia Plan  ASA: II  Anesthesia Plan: MAC   Post-op Pain Management:    Induction: Intravenous  PONV Risk Score and Plan: 0 and Propofol  infusion  Airway Management Planned: Natural Airway and Nasal Cannula  Additional Equipment: None  Intra-op Plan:   Post-operative Plan:   Informed Consent: I have reviewed the patients History and Physical, chart, labs and discussed the procedure including the risks, benefits and alternatives for the proposed anesthesia with the patient or authorized representative who has indicated his/her understanding and acceptance.     Dental advisory given  Plan Discussed with: CRNA  Anesthesia Plan Comments:         Anesthesia Quick Evaluation

## 2023-04-19 NOTE — Anesthesia Postprocedure Evaluation (Signed)
 Anesthesia Post Note  [name]  Procedure(s) Performed: ESOPHAGOGASTRODUODENOSCOPY (EGD) WITH PROPOFOL  BIOPSY     Patient location during evaluation: PACU Anesthesia Type: MAC Level of consciousness: awake and alert Pain management: pain level controlled Vital Signs Assessment: post-procedure vital signs reviewed and stable Respiratory status: spontaneous breathing, nonlabored ventilation and respiratory function stable Cardiovascular status: blood pressure returned to baseline and stable Postop Assessment: no apparent nausea or vomiting Anesthetic complications: no   No notable events documented.  Last Vitals:  Vitals:   04/19/23 0920 04/19/23 0937  BP: 97/71 117/74  Pulse: 73 66  Resp: 13 17  Temp:    SpO2: 100% 100%    Last Pain:  Vitals:   04/19/23 0920  TempSrc:   PainSc: 0-No pain                 Butler Levander Pinal

## 2023-04-19 NOTE — Op Note (Signed)
 [facility] Patient Name: Kristin [name] Procedure Date : 04/19/2023 MRN: [id number] Attending MD: Inocente Hausen , MD, [id number] Date of Birth: 1976/02/11 CSN: [id number] Age: 48 Admit Type: Inpatient Procedure:                Upper GI endoscopy Indications:              Epigastric abdominal pain, Follow-up of                            gastro-esophageal reflux disease, Nausea with                            vomiting, Anemia Providers:                Inocente Hausen, MD, Ozell Pouch, Coye Canada, Technician Referring MD:              Medicines:                Monitored Anesthesia Care Complications:            No immediate complications. Estimated blood loss:                            Minimal. Estimated Blood Loss:     Estimated blood loss was minimal. Procedure:                Pre-Anesthesia Assessment:                           - Prior to the procedure, a History and Physical                            was performed, and patient medications and                            allergies were reviewed. The patient's tolerance of                            previous anesthesia was also reviewed. The risks                            and benefits of the procedure and the sedation                            options and risks were discussed with the patient.                            All questions were answered, and informed consent                            was obtained. Prior Anticoagulants: The patient has                            taken no anticoagulant or antiplatelet agents. ASA  Grade Assessment: II - A patient with mild systemic                            disease. After reviewing the risks and benefits,                            the patient was deemed in satisfactory condition to                            undergo the procedure.                           After obtaining informed consent, the endoscope was                             passed under direct vision. Throughout the                            procedure, the patient's blood pressure, pulse, and                            oxygen saturations were monitored continuously. The                            [id number]-H190 ([id number] Olympus endoscope was introduced                            through the mouth, and advanced to the second part                            of duodenum. The upper GI endoscopy was                            accomplished without difficulty. The patient                            tolerated the procedure well. Scope In: Scope Out: Findings:      The examined esophagus was normal. Biopsies were obtained from the       proximal and distal esophagus with cold forceps for histology of       suspected eosinophilic esophagitis.      The gastric body, gastric antrum, cardia (on retroflexion) and gastric       fundus (on retroflexion) were normal. Minimal erythema was seen in the       prepyloric region. Biopsies were taken with a cold forceps for       Helicobacter pylori testing.      The duodenal bulb and second portion of the duodenum were normal. Impression:               - Normal esophagus. Biopsied?"rule out EOE.                           - Normal gastric body, antrum, cardia and gastric  fundus. Minimal erythema was seen in the prepyloric                            region. Biopsied. Rule out H. pylori                           - Normal duodenal bulb and second portion of the                            duodenum.                           - Biopsies were taken with a cold forceps for                            evaluation of eosinophilic esophagitis. Recommendation:           - Return patient to hospital ward for ongoing care.                           - Await pathology results.                           - Continue pantoprazole  40 mg p.o. twice daily                           - If reflux symptoms  persist despite twice daily                            pantoprazole  patient can add famotidine  20 mg p.o.                            nightly                           - May use Tums as needed                           - If abdominal pain persists can consider use of                            Maalox, Mylanta or Carafate  Procedure Code(s):        --- Professional ---                           [phone], Esophagogastroduodenoscopy, flexible,                            transoral; with biopsy, single or multiple Diagnosis Code(s):        --- Professional ---                           R10.13, Epigastric pain                           K21.9, Gastro-esophageal reflux disease without  esophagitis                           R11.2, Nausea with vomiting, unspecified CPT copyright 2022 American Medical Association. All rights reserved. The codes documented in this report are preliminary and upon coder review may  be revised to meet current compliance requirements. Inocente Hausen, MD 04/19/2023 9:08:44 AM This report has been signed electronically. Number of Addenda: 0

## 2023-04-19 NOTE — Assessment & Plan Note (Signed)
 Certain whether patient's current creatinine elevation is related to chronic underlying kidney disease or represents an AKI related to her eating and poor p.o. intake.  She is not diagnosed with chronic kidney disease anywhere that I can see in her chart but may meet criteria.  Will continue to monitor and if after her scope today she is unable to take adequate p.o. we will consider a small bolus.

## 2023-04-20 ENCOUNTER — Other Ambulatory Visit (HOSPITAL_COMMUNITY): Payer: Self-pay

## 2023-04-20 DIAGNOSIS — K2100 Gastro-esophageal reflux disease with esophagitis, without bleeding: Secondary | ICD-10-CM | POA: Diagnosis not present

## 2023-04-20 DIAGNOSIS — R1013 Epigastric pain: Secondary | ICD-10-CM | POA: Diagnosis not present

## 2023-04-20 DIAGNOSIS — R1115 Cyclical vomiting syndrome unrelated to migraine: Secondary | ICD-10-CM | POA: Diagnosis not present

## 2023-04-20 DIAGNOSIS — E861 Hypovolemia: Secondary | ICD-10-CM | POA: Diagnosis not present

## 2023-04-20 LAB — SURGICAL PATHOLOGY

## 2023-04-20 LAB — BASIC METABOLIC PANEL
Anion gap: 5 (ref 5–15)
BUN: 13 mg/dL (ref 6–20)
CO2: 26 mmol/L (ref 22–32)
Calcium: 8 mg/dL — ABNORMAL LOW (ref 8.9–10.3)
Chloride: 104 mmol/L (ref 98–111)
Creatinine, Ser: 1.07 mg/dL — ABNORMAL HIGH (ref 0.44–1.00)
GFR, Estimated: >60 mL/min (ref >60)
Glucose, Bld: 99 mg/dL (ref 70–99)
Potassium: 4.1 mmol/L (ref 3.5–5.1)
Sodium: 135 mmol/L (ref 135–145)

## 2023-04-20 LAB — CBC
HCT: 23.4 % — ABNORMAL LOW (ref 36.0–46.0)
Hemoglobin: 7.8 g/dL — ABNORMAL LOW (ref 12.0–15.0)
MCH: 31.7 pg (ref 26.0–34.0)
MCHC: 33.3 g/dL (ref 30.0–36.0)
MCV: 95.1 fL (ref 80.0–100.0)
Platelets: 110 10*3/uL — ABNORMAL LOW (ref 150–400)
RBC: 2.46 MIL/uL — ABNORMAL LOW (ref 3.87–5.11)
RDW: 13.9 % (ref 11.5–15.5)
WBC: 3.8 10*3/uL — ABNORMAL LOW (ref 4.0–10.5)
nRBC: 0 % (ref 0.0–0.2)

## 2023-04-20 LAB — TECHNOLOGIST SMEAR REVIEW

## 2023-04-20 MED ORDER — PANTOPRAZOLE SODIUM 40 MG PO TBEC
40.0000 mg | DELAYED_RELEASE_TABLET | Freq: Two times a day (BID) | ORAL | 0 refills | Status: DC
  Filled 2023-04-20: qty 60, 30d supply, fill #0

## 2023-04-20 MED ORDER — FOLIC ACID 1 MG PO TABS
1.0000 mg | ORAL_TABLET | Freq: Every day | ORAL | 0 refills | Status: DC
  Filled 2023-04-20: qty 30, 30d supply, fill #0

## 2023-04-20 NOTE — Assessment & Plan Note (Deleted)
 It has now had 24 hours of improved p.o. intake, creatinine has come down to 1.07.  Suspect this was a true AKI in the setting of severe dehydration rather than underlying kidney disease. -Continue to encourage p.o. intake -Monitor with daily BMP while inpatient

## 2023-04-20 NOTE — Progress Notes (Signed)
 Pt RN Reviewed AVS and Removed IV  Patient transported to discharge lounge via transport; Transport informed patient has medications from [facility] pharmacy to pick up

## 2023-04-20 NOTE — Plan of Care (Signed)
   Problem: Activity: Goal: Risk for activity intolerance will decrease Outcome: Progressing   Problem: Nutrition: Goal: Adequate nutrition will be maintained Outcome: Progressing   Problem: Pain Managment: Goal: General experience of comfort will improve and/or be controlled Outcome: Progressing   Problem: Safety: Goal: Ability to remain free from injury will improve Outcome: Progressing

## 2023-04-20 NOTE — Assessment & Plan Note (Deleted)
 Patient had recent iron studies which were within normal range, smear done yesterday showed no morphologic abnormalities.  White cell, red cell hemoglobin and platelet counts steady today.  Suspect this is hemodilutional in the setting of aggressive fluid resuscitation poor p.o. intake. -Continue to monitor with daily CBC while inpatient -Recommend outpatient follow-up

## 2023-04-20 NOTE — Progress Notes (Signed)
 Started care for this patient at 11:30 this morning.  Pt A&Ox4, VSS on RA. Discharge order received. AVS reviewed with pt.  Pt verbalized understanding. Saline lock removed.  Pt requested a work physicist, medical. Letter printed and given to patient. Personal belongings with pt at time of d/c. Taxi voucher provided to patient for transport.  Discharge lounge staff will transport pt to pickup meds and then call taxi for patient pick up.

## 2023-04-20 NOTE — Assessment & Plan Note (Deleted)
 EGD completed yesterday was suggestive of eosinophilic esophagitis.  Biopsy was taken and sent for pathology.  Awaiting GI recs prior to discharge. -Follow-up GI recs -p.o. Protonix  to 40 mg twice daily -Zofran  4 mg as needed

## 2023-04-20 NOTE — Discharge Summary (Signed)
 Family Medicine Teaching [facility] Discharge Summary  Patient name: Kristin [name] Medical record number: [id number] Date of birth: 12-21-1975 Age: 48 y.o. Gender: female Date of Admission: 04/16/2023  Date of Discharge: 04/20/2023 Admitting Physician: Leita SHAUNNA Gaskins, DO  Primary Care Provider: Rosendo Rush, MD Consultants: Gastroenterology  Indication for Hospitalization: Intractable N/V, dehydration  Discharge Diagnoses/Problem List:  Principal Problem for Admission: AKI Other Problems addressed during stay:  Active Problems:   Anemia   AKI (acute kidney injury) (HCC)   Gastroesophageal reflux disease   Cyclical vomiting syndrome not associated with migraine   Brief Hospital Course:  Kristin [name] is a 48 y.o.female with a history of HTN, Bipolar disorder, previous takotsubo cardiomyopathy with LVEF of 25-30% and severe GERD who was transferred from the ICU to the [facility] Medicine Teaching Service at [facility] for lightheadedness, dizziness, and dehydration.Kristin [name] Her hospital course is detailed below:  Dehydration with hyponatremia Hypotension AKI Patient initially hypotensive in the ED and fluid resuscitated. Despite fluid resuscitation, patient continued to have low Bps and was thus admitted to ICU. In the ED, found to have hyponatremia with Na at 126. Lactic acid also elevated to 3.8 which only minimally downtrended to 3. Patient was treated with mIVF and did not require pressors to normalize BP. Hyponatremia and Cr continued to improve with sodium containing fluids. Patient does have history of hypertension, all medications were held during admission. At time of discharge her electrolytes had normalized and her AKI was improving. She was also able to tolerate adequate PO intake with no further concerns for vomiting.   Hx of Takatosubo cardiomyopathy Repeat Echo showed normal EF of 60-65%, improved from prior echo. Otherwise normal echo.   Chronic GERD/ Eosinophilic  Esophagitis GI was consulted given history of chronic GERD with reflux symptoms and vomiting causing poor PO intake, likely contributing to clinical picture. PPI was increased to BID dosing. EGD was done which showed eosinophilic esophagitis.  Biopsy was taken and sent for pathologist review. At time of discharge, GI recommended outpatient follow up.  Pancytopenia Patient initially admitted with Hg of 11.1 which downtrended to 8.7 in absence of overt bleeding. Found to be folate deficient. New leukopenia and thrombocytopenia of unclear etiology also noted. Continued to trend CBC which showed stable cell counts.  Smear was done which showed no morphologic abnormalities.  Most likely hemodilution all in the setting of aggressive fluid septation and chronic poor p.o. intake. Recommend outpatient follow up.     Other chronic conditions were medically managed with home medications and formulary alternatives as necessary   PCP Follow-up Recommendations: Consider counseling on alcohol and tobacco use Follow-up pancytopenia outpatient Trend kidney function for possible underlying CKD vs. Repeated AKI Ensure follow up with GI   Disposition: Home  Discharge Condition: Improved  Discharge Exam:  Vitals:   04/20/23 0520 04/20/23 0731  BP: 101/74 105/72  Pulse: 73 75  Resp: 16 15  Temp: 97.8 F (36.6 C)   SpO2: 100% 100%   General: A&O, NAD Cardiac: RRR, no m/r/g Respiratory: CTAB, normal WOB, no w/c/r GI: Soft, NTTP, non-distended  Extremities: NTTP, no peripheral edema.  Significant Procedures: EGD  Significant Labs and Imaging:  Recent Labs  Lab 04/19/23 0653 04/20/23 0519  WBC 3.1* 3.8*  HGB 7.9* 7.8*  HCT 23.7* 23.4*  PLT 103* 110*   Recent Labs  Lab 04/19/23 0653 04/20/23 0519  NA 134* 135  K 3.8 4.1  CL 104 104  CO2 24 26  GLUCOSE 100*  99  BUN 20 13  CREATININE 1.39* 1.07*  CALCIUM  8.3* 8.0*  ALKPHOS 58  --   AST 45*  --   ALT 15  --   ALBUMIN 2.4*  --     Results/Tests Pending at Time of Discharge: Pathology review of esophageal biopsy  Discharge Medications:  Allergies as of 04/20/2023       Reactions   Imitrex [sumatriptan] Anaphylaxis, Other (See Comments), Hypertension   Numbness    Banana Other (See Comments)   Stomach cramps        Medication List     PAUSE taking these medications    lisinopril -hydrochlorothiazide  10-12.5 MG tablet Wait to take this until your doctor or other care provider tells you to start again. Commonly known as: ZESTORETIC  Take 1 tablet by mouth daily.       STOP taking these medications    famotidine  20 MG tablet Commonly known as: PEPCID    ibuprofen  200 MG tablet Commonly known as: ADVIL        TAKE these medications    cyclobenzaprine  5 MG tablet Commonly known as: FLEXERIL  Take 5 mg by mouth 3 (three) times daily as needed for muscle spasms.   folic acid  1 MG tablet Commonly known as: FOLVITE  Take 1 tablet (1 mg total) by mouth daily. Start taking on: April 21, 2023   gabapentin  300 MG capsule Commonly known as: NEURONTIN  Take 1 capsule (300 mg total) by mouth as directed. Take 1 capsule in the morning, 1 capsule in the afternoon, and 3 capsules at night. What changed:  how much to take when to take this reasons to take this additional instructions   Multivitamin Women Tabs Take 1 tablet by mouth daily.   pantoprazole  40 MG tablet Commonly known as: PROTONIX  Take 1 tablet (40 mg total) by mouth 2 (two) times daily before a meal. What changed: when to take this   triamcinolone  cream 0.1 % Commonly known as: KENALOG  Apply 1 Application topically 2 (two) times daily. DO NOT USE ON FACE What changed:  when to take this reasons to take this additional instructions   TUMS PO Take 1-2 tablets by mouth 2 (two) times daily as needed (heartburn, acid reflux).   TYLENOL  PO Take 2 tablets by mouth 2 (two) times daily as needed (pain).        Discharge  Instructions: Please refer to Patient Instructions section of EMR for full details.  Patient was counseled important signs and symptoms that should prompt return to medical care, changes in medications, dietary instructions, activity restrictions, and follow up appointments.   Follow-Up Appointments:  Follow-up Information     Rosendo Rush, MD. Go to.   Specialty: Family Medicine Contact information: [address] [place] [address] [phone]                 Cleotilde Lukes, DO 04/20/2023, 12:10 PM PGY-1, [facility]

## 2023-04-22 LAB — CULTURE, BLOOD (SINGLE): Culture: NO GROWTH

## 2023-04-24 ENCOUNTER — Encounter: Payer: Self-pay | Admitting: Pediatrics

## 2023-04-24 ENCOUNTER — Telehealth: Payer: Self-pay | Admitting: *Deleted

## 2023-04-24 NOTE — Telephone Encounter (Signed)
-----   Message from Ottie Glazier sent at 04/24/2023 11:29 AM EST ----- Regarding: Hospital follow-up appointment I saw Ms. Kassing in the hospital for abdominal pain, nausea and GERD.  She has been followed by Dr. Bosie Clos in the past.  I cannot remember if Hyacinth Meeker told me if the patient was trying to switch providers or not.  Please contact her and see if she intends to follow-up with Dr. Bosie Clos or if she prefers to schedule an appointment to see me or one of our APP's in the future.  Thanks,  Harriett Sine

## 2023-04-24 NOTE — Telephone Encounter (Signed)
Attempted to reach patient. No answer and phone eventually leads to fast busy signal. Will reach out again at a later time.

## 2023-04-25 ENCOUNTER — Inpatient Hospital Stay: Payer: Self-pay | Admitting: Student

## 2023-04-25 NOTE — Telephone Encounter (Signed)
Again attempted to reach patient at phone number provided in EPIC. No answer, no voicemail, eventually leading to fast busy signal. Will send a mychart message to patient.

## 2023-05-18 ENCOUNTER — Ambulatory Visit (INDEPENDENT_AMBULATORY_CARE_PROVIDER_SITE_OTHER): Admitting: Student

## 2023-05-18 ENCOUNTER — Encounter: Payer: Self-pay | Admitting: Student

## 2023-05-18 VITALS — BP 120/78 | HR 96 | Ht 64.0 in | Wt 159.1 lb

## 2023-05-18 DIAGNOSIS — D649 Anemia, unspecified: Secondary | ICD-10-CM

## 2023-05-18 NOTE — Patient Instructions (Signed)
 It was great to see you! Thank you for allowing me to participate in your care!   Our plans for today:  - We will get labs today, I cannot approve you to get any plasma donations due to your last set of labs  Take care and seek immediate care sooner if you develop any concerns.  Levin Erp, MD

## 2023-05-18 NOTE — Progress Notes (Cosign Needed Addendum)
    SUBJECTIVE:   CHIEF COMPLAINT / HPI: Blood/plasma donation clearance  Hospitalized 2/3-2/7 for hypovolemia requiring ICU stay d/t severe reflux/vomiting/decreased PO intake. She missed her hospital follow up visit.  PCP Follow-up Recommendations: Consider counseling on alcohol and tobacco use  Follow-up pancytopenia outpatient - CBC ordered Trend kidney function for possible underlying CKD vs. Repeated AKI -BMP ordered Ensure follow up with GI  Today presents for clearance to continue blood donation after abnormal blood values were detected. She donates blood/plasma regularly, sometimes twice a week, and has occasionally felt lightheaded afterwards, which she manages by drinking Gatorade. She reports no cardiac symptoms during or after donation.  During this hospitalization, her hemoglobin levels were notably low (7.8 at discharge) thought to be due to fluid resuscitation. She has been taking over-the-counter iron supplements for her anemia and reports no bleeding.  PERTINENT  PMH / PSH: HTN, Bipolar disorder, previous takotsubo cardiomyopathy with LVEF of 25-30% and severe GERD  OBJECTIVE:   BP 120/78   Pulse 96   Ht 5\' 4"  (1.626 m)   Wt 159 lb 2 oz (72.2 kg)   SpO2 98%   BMI 27.31 kg/m   General: Well appearing, NAD, awake, alert, responsive to questions Head: Normocephalic atraumatic CV: Regular rate and rhythm no murmurs rubs or gallops Respiratory: chest rises symmetrically,  no increased work of breathing Abdomen: Soft, non-tender, non-distended, normoactive bowel sounds  Extremities: Moves upper and lower extremities freely  ASSESSMENT/PLAN:   Assessment & Plan Chronic anemia Let patient know we will have to get baseline labs prior to clearance for blood donation given recent severe anemia. -CBC, BMP -Continue iron supplementation   Patient mentions needing to discuss BP meds after visit completed and labs - recommended PCP visit next week 3/17 for further  discussion. I kept patient's form in my box pending results.  Levin Erp, MD [facility]

## 2023-05-19 LAB — BASIC METABOLIC PANEL
BUN/Creatinine Ratio: 7 — ABNORMAL LOW (ref 9–23)
BUN: 6 mg/dL (ref 6–24)
CO2: 23 mmol/L (ref 20–29)
Calcium: 9.1 mg/dL (ref 8.7–10.2)
Chloride: 101 mmol/L (ref 96–106)
Creatinine, Ser: 0.86 mg/dL (ref 0.57–1.00)
Glucose: 92 mg/dL (ref 70–99)
Potassium: 3.7 mmol/L (ref 3.5–5.2)
Sodium: 140 mmol/L (ref 134–144)
eGFR: 83 mL/min/{1.73_m2} (ref >59)

## 2023-05-19 LAB — CBC WITH DIFFERENTIAL/PLATELET
Basophils Absolute: 0.1 10*3/uL (ref 0.0–0.2)
Basos: 2 %
EOS (ABSOLUTE): 0.2 10*3/uL (ref 0.0–0.4)
Eos: 5 %
Hematocrit: 32 % — ABNORMAL LOW (ref 34.0–46.6)
Hemoglobin: 10.1 g/dL — ABNORMAL LOW (ref 11.1–15.9)
Immature Grans (Abs): 0 10*3/uL (ref 0.0–0.1)
Immature Granulocytes: 0 %
Lymphocytes Absolute: 1 10*3/uL (ref 0.7–3.1)
Lymphs: 34 %
MCH: 31 pg (ref 26.6–33.0)
MCHC: 31.6 g/dL (ref 31.5–35.7)
MCV: 98 fL — ABNORMAL HIGH (ref 79–97)
Monocytes Absolute: 0.4 10*3/uL (ref 0.1–0.9)
Monocytes: 12 %
Neutrophils Absolute: 1.4 10*3/uL (ref 1.4–7.0)
Neutrophils: 47 %
Platelets: 140 10*3/uL — ABNORMAL LOW (ref 150–450)
RBC: 3.26 x10E6/uL — ABNORMAL LOW (ref 3.77–5.28)
RDW: 14.4 % (ref 11.7–15.4)
WBC: 3 10*3/uL — ABNORMAL LOW (ref 3.4–10.8)

## 2023-05-21 ENCOUNTER — Ambulatory Visit

## 2023-05-21 ENCOUNTER — Encounter: Payer: Self-pay | Admitting: Student

## 2023-05-22 ENCOUNTER — Ambulatory Visit (INDEPENDENT_AMBULATORY_CARE_PROVIDER_SITE_OTHER)

## 2023-05-22 DIAGNOSIS — Z111 Encounter for screening for respiratory tuberculosis: Secondary | ICD-10-CM

## 2023-05-22 NOTE — Progress Notes (Unsigned)
 Patient is here for a PPD placement.  PPD placed in left forearm @ 2:49 pm.  Patient will return 05/24/2023 to have PPD read. Veronda Prude, RN

## 2023-05-24 ENCOUNTER — Telehealth: Payer: Self-pay | Admitting: Student

## 2023-05-24 ENCOUNTER — Ambulatory Visit

## 2023-05-24 DIAGNOSIS — Z111 Encounter for screening for respiratory tuberculosis: Secondary | ICD-10-CM

## 2023-05-24 LAB — TB SKIN TEST
Induration: 0 mm
TB Skin Test: NEGATIVE

## 2023-05-24 NOTE — Telephone Encounter (Signed)
 Form placed up front for pick up.   Copy made for batch scanning.   Patient informed during nurse visit for PPD read. Patient was confused on why she was not cleared for plasma donation.   Advised we need to see her levels improve.   Patient to FU in a few months.

## 2023-05-24 NOTE — Telephone Encounter (Signed)
 Placed PMCP letter for transfusions in RN triage box, not approved for plasmapheresis

## 2023-05-24 NOTE — Progress Notes (Signed)
PPD Reading Note PPD read and results entered in EpicCare. Result: 0 mm induration. Interpretation: Negative Allergic reaction: No  

## 2023-05-28 ENCOUNTER — Ambulatory Visit: Payer: Self-pay | Admitting: Student

## 2023-05-31 ENCOUNTER — Other Ambulatory Visit: Payer: Self-pay

## 2023-05-31 DIAGNOSIS — I10 Essential (primary) hypertension: Secondary | ICD-10-CM

## 2023-05-31 DIAGNOSIS — G8929 Other chronic pain: Secondary | ICD-10-CM

## 2023-06-05 MED ORDER — GABAPENTIN 300 MG PO CAPS: 300.0000 mg | ORAL_CAPSULE | ORAL | 1 refills | Status: DC

## 2023-06-05 MED ORDER — LISINOPRIL-HYDROCHLOROTHIAZIDE 10-12.5 MG PO TABS: 1.0000 | ORAL_TABLET | Freq: Every day | ORAL | 3 refills | Status: DC

## 2023-06-06 NOTE — Progress Notes (Addendum)
    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  Uses topical OTC ointments along with Ibuprofen and Tylenol for Arthritis, but nothing seems to help her symptoms.  She reports that she also chronically has some incontinence issues - both bowel and urinary.  Along with the weakness in her legs due to the chronicity of this low back/knee pain. Has done PT previously and it was too painful for her to handle.  She was also referred to both orthopedics and neurosurgery previously for further evaluation and treatment.  Possible UTI Endorses increased frequency, darker urine, and no pain.  She endorses having recurrent UTIs in the past.   PERTINENT  PMH / PSH: HTN, Arthritis, Back Pain, Anxiety  OBJECTIVE:   BP 105/63   Pulse (!) 113   Ht 5' 4.5" (1.638 m)   Wt 158 lb (71.7 kg)   SpO2 100%   BMI 26.70 kg/m   General: Awake and Alert in NAD HEENT: NCAT. Sclera anicteric. No rhinorrhea. Cardiovascular: RRR. No M/R/G Respiratory: CTAB, normal WOB on RA. No wheezing, crackles, rhonchi, or diminished breath sounds. Abdomen: Soft, non-tender, non-distended. Bowel sounds normoactive Extremities: Able to move all extremities. No BLE edema, no deformities or significant joint findings.  TTP over L4-L5 region. Skin: Warm and dry. No abrasions or rashes noted. Neuro: No focal neurological deficits.  ASSESSMENT/PLAN:   Assessment & Plan Chronic bilateral low back pain with bilateral sciatica Due to the chronicity of her low back pain, advised patient to stretch and use OTC medications with caution.  Discussed red flag symptoms, however due to chronicity in nature of her pain and associated weakness and incontinence, will continue to monitor. - Tramadol 50 mg q8h for 5 days prescribed - Advised patient to continue taking her gabapentin as prescribed, this was  recently increased in January - Advised patient to follow-up with neurosurgery as referral was placed in January.  Provided information for her to schedule an appointment. Chronic pain of both knees - Tramadol 50 mg q8h for 5 days prescribed Increased urinary frequency Patient reports history of recurrent UTIs.  Currently is having increased frequency without dysuria. - UA Rash and nonspecific skin eruption - Refilled Kenalog cream  Fortunato Curling, DO [facility]

## 2023-06-07 ENCOUNTER — Encounter: Payer: Self-pay | Admitting: Family Medicine

## 2023-06-07 ENCOUNTER — Ambulatory Visit (INDEPENDENT_AMBULATORY_CARE_PROVIDER_SITE_OTHER): Admitting: Family Medicine

## 2023-06-07 VITALS — BP 105/63 | HR 113 | Ht 64.5 in | Wt 158.0 lb

## 2023-06-07 DIAGNOSIS — M25561 Pain in right knee: Secondary | ICD-10-CM | POA: Diagnosis not present

## 2023-06-07 DIAGNOSIS — R21 Rash and other nonspecific skin eruption: Secondary | ICD-10-CM | POA: Diagnosis not present

## 2023-06-07 DIAGNOSIS — R350 Frequency of micturition: Secondary | ICD-10-CM | POA: Diagnosis not present

## 2023-06-07 DIAGNOSIS — M5441 Lumbago with sciatica, right side: Secondary | ICD-10-CM | POA: Diagnosis not present

## 2023-06-07 DIAGNOSIS — G8929 Other chronic pain: Secondary | ICD-10-CM

## 2023-06-07 DIAGNOSIS — M5442 Lumbago with sciatica, left side: Secondary | ICD-10-CM

## 2023-06-07 DIAGNOSIS — M25562 Pain in left knee: Secondary | ICD-10-CM

## 2023-06-07 MED ORDER — TRIAMCINOLONE ACETONIDE 0.1 % EX CREA: 1.0000 | TOPICAL_CREAM | Freq: Two times a day (BID) | CUTANEOUS | 0 refills | Status: DC

## 2023-06-07 MED ORDER — TRAMADOL HCL 50 MG PO TABS: 50.0000 mg | ORAL_TABLET | Freq: Three times a day (TID) | ORAL | 0 refills | Status: AC | PRN

## 2023-06-07 NOTE — Patient Instructions (Addendum)
 It was great to see you today! Thank you for choosing [facility] for your primary care. Kristin [name] was seen for chronic low back and knee pain.  Today we addressed: Low Back/Knee pain - Tramadol 50 mg for 5 days three times a day as needed was prescribed.  Possible UTI - will treat based on urinalysis Follow up on neurosurgery referral.   Referral Sent to: [facility] [address] [place], [place] [address] [phone] They will call pt to schedule an appointment. Clemencia Course, CMA  You should return to our clinic No follow-ups on file. Please arrive 15 minutes before your appointment to ensure smooth check in process.  We appreciate your efforts in making this happen.  Thank you for allowing me to participate in your care, Fortunato Curling, DO 06/07/2023, 4:08 PM PGY-1, [facility]

## 2023-06-08 ENCOUNTER — Telehealth: Payer: Self-pay | Admitting: Family Medicine

## 2023-06-08 LAB — URINALYSIS
Glucose, UA: NEGATIVE
Nitrite, UA: NEGATIVE
RBC, UA: NEGATIVE
Specific Gravity, UA: 1.021 (ref 1.005–1.030)
Urobilinogen, Ur: 1 mg/dL (ref 0.2–1.0)
pH, UA: 5.5 (ref 5.0–7.5)

## 2023-06-08 NOTE — Addendum Note (Signed)
 Addended by: Jennette Bill on: 06/08/2023 10:21 AM   Modules accepted: Orders

## 2023-06-08 NOTE — Telephone Encounter (Signed)
 Called patient x2 to discuss PHQ-9 #9 response from clinic visit.  Patient's phone disconnected the call both times (potentially d/t unknown numbers being blocked.  Since patient couldn't be reached, attempted to call her mother, Ms. Shirlee Latch who answered and confirmed the number.  Retried Blanka another time with no answer.  Called mom again and asked if she could have Yvonnia contact me on nearby work phone, however no return call was made.  Fortunato Curling, DO

## 2023-06-12 LAB — URINE CULTURE

## 2023-06-13 ENCOUNTER — Other Ambulatory Visit: Payer: Self-pay | Admitting: Family Medicine

## 2023-06-13 ENCOUNTER — Encounter: Payer: Self-pay | Admitting: Family Medicine

## 2023-06-13 MED ORDER — SULFAMETHOXAZOLE-TRIMETHOPRIM 800-160 MG PO TABS: 1.0000 | ORAL_TABLET | Freq: Two times a day (BID) | ORAL | 0 refills | Status: DC

## 2023-06-14 NOTE — Telephone Encounter (Signed)
 Attempted to call patient to review urine culture, however patient did not answer and there was no voicemail box.  Prescribed Bactrim twice daily days for positive Klebsiella pneumonia UTI.  Fortunato Curling, DO

## 2023-06-15 ENCOUNTER — Telehealth: Payer: Self-pay | Admitting: Student

## 2023-06-15 ENCOUNTER — Other Ambulatory Visit: Payer: Self-pay | Admitting: Student

## 2023-06-15 ENCOUNTER — Ambulatory Visit

## 2023-06-15 VITALS — BP 93/64 | HR 109 | Ht 64.5 in | Wt 161.0 lb

## 2023-06-15 DIAGNOSIS — N12 Tubulo-interstitial nephritis, not specified as acute or chronic: Secondary | ICD-10-CM

## 2023-06-15 MED ORDER — SULFAMETHOXAZOLE-TRIMETHOPRIM 800-160 MG PO TABS: 1.0000 | ORAL_TABLET | Freq: Two times a day (BID) | ORAL | 0 refills | Status: AC

## 2023-06-15 MED ORDER — SULFAMETHOXAZOLE-TRIMETHOPRIM 800-160 MG PO TABS: 1.0000 | ORAL_TABLET | Freq: Two times a day (BID) | ORAL | 0 refills | Status: DC

## 2023-06-15 MED ORDER — IBUPROFEN 800 MG PO TABS: 800.0000 mg | ORAL_TABLET | Freq: Three times a day (TID) | ORAL | 0 refills | Status: DC | PRN

## 2023-06-15 NOTE — Patient Instructions (Signed)
 It was great to see you! Thank you for allowing me to participate in your care!  I recommend that you always bring your medications to each appointment as this makes it easy to ensure we are on the correct medications and helps [place] not miss when refills are needed.  Our plans for today:  - You have an infection of your bladder/kidneys. I've sent medications in for you, to treat this. Take the antibiotics twice a day for seven days   Bactrim twice a day If you are not feeling better in 4-5 days, make a follow up appointment If you start feeling worse (worsening back pain, worsening belly pain, fevers, nausea/vomiting) Go to Emergency Department    Take care and seek immediate care sooner if you develop any concerns.   Dr. Bess Kinds, MD [facility]

## 2023-06-15 NOTE — Progress Notes (Signed)
error 

## 2023-06-15 NOTE — Addendum Note (Signed)
 Addended by: Bess Kinds T on: 06/15/2023 06:05 PM   Modules accepted: Orders

## 2023-06-15 NOTE — Progress Notes (Addendum)
  SUBJECTIVE:   CHIEF COMPLAINT / HPI:   Sick Visit -UTI noted on 4/2 abx sent in, patient ?aware  Constatnly going to pee, and feeling unwell, body aches, w/ some stomach pain and also has some back pain. Continues to have urinary frequency and dysuria. Has had no fevers, but feels generally unwell.    PERTINENT  PMH / PSH:    OBJECTIVE:  BP 93/64   Pulse (!) 109   Ht 5' 4.5" (1.638 m)   Wt 161 lb (73 kg)   SpO2 97%   BMI 27.21 kg/m  Physical Exam Constitutional:      General: She is not in acute distress.    Appearance: Normal appearance. She is not ill-appearing.  Cardiovascular:     Rate and Rhythm: Regular rhythm. Tachycardia present.     Heart sounds: Normal heart sounds. No murmur heard.    No friction rub. No gallop.  Pulmonary:     Effort: Pulmonary effort is normal. No respiratory distress.     Breath sounds: Normal breath sounds. No stridor. No wheezing, rhonchi or rales.  Abdominal:     General: There is distension.     Palpations: Abdomen is soft. There is no mass.     Tenderness: There is abdominal tenderness. There is right CVA tenderness and left CVA tenderness. There is no guarding or rebound.     Hernia: No hernia is present.  Neurological:     Mental Status: She is alert.  Psychiatric:        Mood and Affect: Mood normal.        Behavior: Behavior normal.      ASSESSMENT/PLAN:   Assessment & Plan Pyelonephritis Patient comes in with complaint of abdominal pain/back pain/not feeling well.  Patient recently seen in clinic, noted to have UTI on UA, but patient was unaware and had not picked up medications.  Patient has pain/tenderness in costovertebral angle bilaterally on her back, and also has abdominal pain.  Suspect patient's UTI has progressed to pyelonephritis. Patient's blood pressure was orthostatic and EMS was called for transport to ED. Patient declined transport and signed AMA form. Patient preferred to go to ED via family member for cost  of ambulance.  - Bactrim double strength x 7 days - Patient to follow-up in ED No follow-ups on file. Bess Kinds, MD 06/15/2023, 4:03 PM PGY-3, [facility]

## 2023-06-15 NOTE — Assessment & Plan Note (Addendum)
 Patient comes in with complaint of abdominal pain/back pain/not feeling well.  Patient recently seen in clinic, noted to have UTI on UA, but patient was unaware and had not picked up medications.  Patient has pain/tenderness in costovertebral angle bilaterally on her back, and also has abdominal pain.  Suspect patient's UTI has progressed to pyelonephritis. Patient's blood pressure was orthostatic and EMS was called for transport to ED. Patient declined transport and signed AMA form. Patient preferred to go to ED via family member for cost of ambulance.  - Bactrim double strength x 7 days - Patient to follow-up in ED

## 2023-06-15 NOTE — Telephone Encounter (Signed)
 Called [facility] on [address] to cancel Rx from Dr. Fatima Blank that was for 3 days, and to give verbal order for bactrim ds for 7 days BID. Spoke with pharmacist Dorene Grebe. She canceled the order from Dr. Fatima Blank and placed the order for a 7 day supply of abx

## 2023-06-17 ENCOUNTER — Telehealth: Payer: Self-pay | Admitting: Student

## 2023-06-17 ENCOUNTER — Other Ambulatory Visit: Payer: Self-pay | Admitting: Student

## 2023-06-17 ENCOUNTER — Encounter: Payer: Self-pay | Admitting: Student

## 2023-06-17 NOTE — Progress Notes (Signed)
error 

## 2023-06-17 NOTE — Telephone Encounter (Signed)
 Called patients contact/mother Derwood Kaplan to attempt to get ahold of patient. No answer and provider did not leave voicemail.  Will likely re-attempt later

## 2023-06-17 NOTE — Telephone Encounter (Signed)
 Called and spoke with pharmacist [facility] that patient had picked up medications. She noted that patient's number had changed.   [phone]

## 2023-06-17 NOTE — Telephone Encounter (Signed)
 Called to check on patient to see if she had gone to ED as she had planned to.   Call rang once, and then said incomplete/call could not be made. Called patient x 2

## 2023-06-17 NOTE — Telephone Encounter (Signed)
 Called patient at phone number pharmacy listed and patient's mother's phone x 2.   Unable to get ahold of patient. I have sent a mychart message

## 2023-06-17 NOTE — Telephone Encounter (Signed)
 Called patient at all available numbers in chart, mother and 2 daughters, was unable to reach patient.   Called pharmacy and asked for alternate number.   Only had number [phone], but when I call, it rings a few times and then notes number is not in service.  Have only been able to confirm her mother's VM. Will attempt to call her back later today, to reach patient.

## 2023-06-18 ENCOUNTER — Telehealth: Payer: Self-pay | Admitting: Student

## 2023-06-18 NOTE — Telephone Encounter (Signed)
 Returned call to patient at number [phone].  She reports she is still having a lot of body aches, and taking the antibiotics. She notes that she did not go to the ED after leaving the clinic. She discussed that she was taking ibuprofen as needed for pain. I told her to stop ibuprofen and use tylenol for pain, being that her kidneys have an infection. She understood and agreed.   Told her to use 1000 mg of tylenol every 8 hours for her pain. Also discussed getting her back to clinic this week to be seen. Got patient scheduled in access to care on Thur at 3:30

## 2023-06-18 NOTE — Telephone Encounter (Signed)
 Called pt's mother to ask for contact number for patient / to have mother ask patient to call clinic back, with working number.   Have tried calling her at multiple numbers, multiple times w/o any luck.

## 2023-06-21 ENCOUNTER — Ambulatory Visit: Payer: Self-pay

## 2023-06-21 NOTE — Progress Notes (Deleted)
    SUBJECTIVE:   CHIEF COMPLAINT / HPI:   ***  Kristin [name] is a 48yo F w/ hx of HTN, CHF,   - Seen 06/15/23, had suspected pyelonephritis. Was advised to go to ED, but also given 7 day course of bactrim. Per 4/7 phone note, pt was still having body aches and was taking antibiotics. She did not ultimately go to ED though.  - Per 3/27 urine culture, grew Klebsiella that is susceptible to Bactrim     PERTINENT  PMH / PSH: ***  OBJECTIVE:   There were no vitals taken for this visit.  ***  ASSESSMENT/PLAN:   Assessment & Plan      Kristin Brigham, MD [facility]

## 2023-07-02 ENCOUNTER — Telehealth: Payer: Self-pay | Admitting: Student

## 2023-07-02 NOTE — Telephone Encounter (Signed)
 Patient dropped off plasma donation form to be completed. Last DOS was 06/15/23. Placed in [employer].

## 2023-07-03 ENCOUNTER — Ambulatory Visit

## 2023-07-03 NOTE — Progress Notes (Deleted)
    SUBJECTIVE:   CHIEF COMPLAINT / HPI:   Form Completion:   PERTINENT  PMH / PSH: ***  OBJECTIVE:   There were no vitals taken for this visit.  ***  ASSESSMENT/PLAN:   Assessment & Plan      Ernestina Headland, MD [facility]

## 2023-07-03 NOTE — Telephone Encounter (Signed)
 Reviewed patient's plasma donation form.  Placed in PCP's box to be completed.  Christ Courier, CMA

## 2023-07-04 ENCOUNTER — Ambulatory Visit: Admitting: Student

## 2023-07-04 ENCOUNTER — Ambulatory Visit

## 2023-07-04 NOTE — Telephone Encounter (Signed)
 Attempted to call patient at provided number. Received error message that call could not be completed as dialed.   No other number available in chart.   Patient has appointment tomorrow in ATC. Will place form at front desk for pick up.   Copy made and placed in batch scanning.   Elsie Halo, RN

## 2023-07-05 ENCOUNTER — Ambulatory Visit (INDEPENDENT_AMBULATORY_CARE_PROVIDER_SITE_OTHER): Admitting: Student

## 2023-07-05 ENCOUNTER — Encounter: Payer: Self-pay | Admitting: Student

## 2023-07-05 VITALS — BP 122/93 | HR 92 | Ht 64.5 in | Wt 165.4 lb

## 2023-07-05 DIAGNOSIS — M545 Low back pain, unspecified: Secondary | ICD-10-CM

## 2023-07-05 DIAGNOSIS — R350 Frequency of micturition: Secondary | ICD-10-CM

## 2023-07-05 DIAGNOSIS — G8929 Other chronic pain: Secondary | ICD-10-CM | POA: Diagnosis not present

## 2023-07-05 DIAGNOSIS — N390 Urinary tract infection, site not specified: Secondary | ICD-10-CM

## 2023-07-05 LAB — POCT URINALYSIS DIP (MANUAL ENTRY)
Blood, UA: NEGATIVE
Glucose, UA: NEGATIVE mg/dL
Nitrite, UA: POSITIVE — AB
Protein Ur, POC: 30 mg/dL — AB
Spec Grav, UA: 1.025 (ref 1.010–1.025)
Urobilinogen, UA: 1 U/dL
pH, UA: 5.5 (ref 5.0–8.0)

## 2023-07-05 MED ORDER — CYCLOBENZAPRINE HCL 5 MG PO TABS: 5.0000 mg | ORAL_TABLET | Freq: Three times a day (TID) | ORAL | 0 refills | Status: DC | PRN

## 2023-07-05 MED ORDER — CEFADROXIL 500 MG PO CAPS: 500.0000 mg | ORAL_CAPSULE | Freq: Two times a day (BID) | ORAL | 0 refills | Status: DC

## 2023-07-05 NOTE — Patient Instructions (Signed)
 It was great to see you! Thank you for allowing me to participate in your care!   Our plans for today:  - Please contact [facility] (Orthopedics): Please contact the referral department at [phone].  - I will be in touch with urine test results  - you can continue over the counter tylenol  1000 mg every 6 hours as needed for pain for now - I recommend considering PT in the future   We are checking some labs today, I will call you if they are abnormal will send you a MyChart message or a letter if they are normal.  If you do not hear about your labs in the next 2 weeks please let us  know.  Take care and seek immediate care sooner if you develop any concerns.   Dr. Glenn Lange, DO [facility]

## 2023-07-05 NOTE — Progress Notes (Unsigned)
    SUBJECTIVE:   CHIEF COMPLAINT / HPI:   Kristin [name], a patient with a history of lumbar spine degeneration and herniated disc, presents with persistent lower back pain and increased urinary frequency. They report that their back pain, which has been chronic and ongoing for many years, seems to worsen during episodes of UTIs. They describe the pain as severe, affecting their entire lower back. The pain has been so severe that it has disrupted their sleep.  She is currently taking Tylenol  and gabapentin  for low back pain and neuropathy that helps a little.  States she previously took Flexeril  for her back pain which helped and would like to get a prescription again.  She denies any urinary retention and admits to a few episodes of fecal incontinence which occurred maybe 6 months ago but has not reoccurred since.  In addition to the back pain, they report increased urinary frequency and a history of UTIs. They have been treated with antibiotics last month, but they believe they have not been effective as they continue to experience symptoms. They describe their urine as having an unusual odor, similar to eggs, but deny any dysuria or hematuria.   PERTINENT  PMH / PSH: Bi polar disorder   OBJECTIVE:   BP (!) 122/93   Pulse 92   Ht 5' 4.5" (1.638 m)   Wt 165 lb 6.4 oz (75 kg)   SpO2 100%   BMI 27.95 kg/m    General: NAD, pleasant, able to participate in exam Cardiac: RRR, no murmurs. Respiratory: CTAB, normal effort, No wheezes, rales or rhonchi Abdomen: Bowel sounds present, nontender, nondistended, soft MSK: Diffuse tenderness to lower back including lumbar's spine and paraspinal muscles, tensing up when I press on them gently, and pain when getting on and off exam table but able to do so without assistance Skin: warm and dry, no rashes noted Neuro: alert, no obvious focal deficits Psych: Normal affect and mood  ASSESSMENT/PLAN:   Recurrent UTI Increased urinary frequency persists  after being treated with Bactrim .  UA today does indicate UTI.  Previous culture was positive for Klebsiella pneumoniae.  Unfortunately we did not have enough urine to culture today.  Patient left prior to UA being completed with plan that I would call her with results.  I have been unable to reach her.  I did send MyChart message and we will also send a letter. - Rx cefadroxil  - Patient to return if symptoms not improve, may need referral to urology given recurrent UTIs  Chronic bilateral low back pain without sciatica Patient has previously been referred to both orthopedics and neurosurgery.  She agrees to schedule appointment with Ortho for further evaluation as she also has OA of bilateral knees and was given their contact information.  She declines PT at this time.  She does seem very tense in her paraspinal muscles and I suspect there could be some muscle strain. -Rx Flexeril  - Continue OTC Tylenol  and gabapentin  - f/u with Ortho   Will need appointment in near future for Pap smear  Dr. Glenn Lange, DO [facility]

## 2023-07-06 ENCOUNTER — Encounter: Payer: Self-pay | Admitting: Student

## 2023-07-06 DIAGNOSIS — N390 Urinary tract infection, site not specified: Secondary | ICD-10-CM | POA: Insufficient documentation

## 2023-07-06 NOTE — Assessment & Plan Note (Signed)
 Patient has previously been referred to both orthopedics and neurosurgery.  She agrees to schedule appointment with Ortho for further evaluation as she also has OA of bilateral knees and was given their contact information.  She declines PT at this time.  She does seem very tense in her paraspinal muscles and I suspect there could be some muscle strain. -Rx Flexeril  - Continue OTC Tylenol  and gabapentin  - f/u with Ortho

## 2023-07-06 NOTE — Assessment & Plan Note (Addendum)
 Increased urinary frequency persists after being treated with Bactrim .  UA today does indicate UTI.  Previous culture was positive for Klebsiella pneumoniae.  Unfortunately we did not have enough urine to culture today.  Patient left prior to UA being completed with plan that I would call her with results.  I have been unable to reach her.  I did send MyChart message and we will also send a letter. - Rx cefadroxil  - Patient to return if symptoms not improve, may need referral to urology given recurrent UTIs

## 2023-07-09 ENCOUNTER — Ambulatory Visit

## 2023-07-09 ENCOUNTER — Telehealth: Payer: Self-pay | Admitting: Student

## 2023-07-09 NOTE — Telephone Encounter (Signed)
 Reviewed form and gave to PCP as I was working with him 07/09/2023.  Gerold Kos, CMA

## 2023-07-09 NOTE — Progress Notes (Deleted)

## 2023-07-09 NOTE — Telephone Encounter (Signed)
 Patient dropped off plasma donation form again. Picked it up last week, but form  was not completed. Needs it as soon as possible to be able to donate plasma. Last DOS was 07/05/23. Placed back in [employer].

## 2023-07-09 NOTE — Telephone Encounter (Signed)
 Attempted to call patient about her form from [facility] requesting clearance to donate plasma given low hematocrit.  Unfortunately unable to reach patient at this time. Will inform CMA Ms Moira Andrews to update patient.

## 2023-07-11 ENCOUNTER — Telehealth: Payer: Self-pay

## 2023-07-11 NOTE — Telephone Encounter (Signed)
-----   Message from [place] Last sent at 07/09/2023  5:20 PM EDT ----- So I tried to call patient to let her know that the issue is [facility] is saying that hematocrit is low and because of that you are having issues with having had to donate plasma.  Fortunately this is most likely due to her history of anemia which she has had in the past.  She will need to ask [facility] if they can still allow her to donate plasma. [facility] usually have guidelines of who can donate based on these labs and the PCP doesn't have control over that. She can ask them what needs to be done for her to donate because the recommendation is if her blood count is low she can't donate.

## 2023-07-11 NOTE — Telephone Encounter (Signed)
 Attempted to call patient x 3 and there was no answer.  There was no ability to leave a message concerning the Plasma Donation form.  If patient should call or come in please relay the previous message to her.  Form will be placed in desk drawer for pick up in case she still wants it.  Gerold Kos, CMA

## 2023-07-13 ENCOUNTER — Ambulatory Visit: Admitting: Student

## 2023-07-16 ENCOUNTER — Encounter: Payer: Self-pay | Admitting: Family Medicine

## 2023-07-16 ENCOUNTER — Ambulatory Visit (INDEPENDENT_AMBULATORY_CARE_PROVIDER_SITE_OTHER): Admitting: Family Medicine

## 2023-07-16 VITALS — BP 142/92 | HR 88 | Ht 64.5 in | Wt 171.2 lb

## 2023-07-16 DIAGNOSIS — D649 Anemia, unspecified: Secondary | ICD-10-CM | POA: Diagnosis not present

## 2023-07-16 DIAGNOSIS — R300 Dysuria: Secondary | ICD-10-CM | POA: Diagnosis present

## 2023-07-16 DIAGNOSIS — N390 Urinary tract infection, site not specified: Secondary | ICD-10-CM | POA: Diagnosis not present

## 2023-07-16 DIAGNOSIS — I10 Essential (primary) hypertension: Secondary | ICD-10-CM

## 2023-07-16 LAB — POCT URINALYSIS DIP (MANUAL ENTRY)
Bilirubin, UA: NEGATIVE
Glucose, UA: NEGATIVE mg/dL
Ketones, POC UA: NEGATIVE mg/dL
Nitrite, UA: POSITIVE — AB
Protein Ur, POC: 30 mg/dL — AB
Spec Grav, UA: 1.025 (ref 1.010–1.025)
Urobilinogen, UA: 0.2 U/dL
pH, UA: 6 (ref 5.0–8.0)

## 2023-07-16 LAB — POCT HEMOGLOBIN: Hemoglobin: 8.8 g/dL — AB (ref 11–14.6)

## 2023-07-16 MED ORDER — LISINOPRIL-HYDROCHLOROTHIAZIDE 10-12.5 MG PO TABS: 1.0000 | ORAL_TABLET | Freq: Every day | ORAL | 3 refills | Status: DC

## 2023-07-16 MED ORDER — CEFADROXIL 500 MG PO CAPS: 500.0000 mg | ORAL_CAPSULE | Freq: Two times a day (BID) | ORAL | 0 refills | Status: DC

## 2023-07-16 NOTE — Assessment & Plan Note (Signed)
 Pancytopenia noted at recent hospitalization, cell lines had been normalizing. Will repeat CBC and iron studies to assess for anemia, may need hem onc referral if remains pancytopenic as she already had normal smear

## 2023-07-16 NOTE — Progress Notes (Signed)
    SUBJECTIVE:   CHIEF COMPLAINT / HPI:   Concern for UTI- per chart review, urinalysis + nitrites and cefadroxil  sent to pharmacy but she notes she was unable to get it. She has chronic low back pain but no flank pain, no fevers or chills, Mild suprapubic pain when she urinates, but no dysuria or blood in urine. Mostly just increased urinary frequency. She denies being sexually active, denies toys/gel/douching in the vagina.   HTN- has been out of [employer]est[employer] for past two days, needs a refill.  Failed plasma donation- Was told her hematocrit was too low to give, wanting to follow up. Has form today. Notes feeling tired. Denies blood in urine or blood. Notes she had an endometrial ablation so hasn't had her menses in years. She had pancytopenia at recent hospitalization with a normal smear, and was suspected to be dilutional and would resolve with time. She denies any bleeding. Does feel she bruises more easily. Denies gum bleeding.  PERTINENT  PMH / PSH: HFrEF, spinal stenosis, GAD, GERD, migraine,HTN, bipolar 1 disease, alcohol use disorder  OBJECTIVE:   BP (!) 142/92   Pulse 88   Ht 5' 4.5" (1.638 m)   Wt 171 lb 3.2 oz (77.7 kg)   SpO2 100%   BMI 28.93 kg/m   General: A&O, NAD HEENT: No sign of trauma, EOM grossly intact Cardiac: RRR, no m/r/g Respiratory: CTAB, normal WOB, no w/c/r GI: Soft, NTTP, non-distended , no CVA tenderness bilaterally Extremities: NTTP, no peripheral edema. Neuro: Normal gait, moves all four extremities appropriately. Skin: no petechiae or bruises noted, gums without bleeding Psych: Appropriate mood and affect   ASSESSMENT/PLAN:   Assessment & Plan Dysuria Urinalysis + infection, did not get cefadroxil , resent to pharmacy and will culture today No signs of pyelonephritis in exam, discussed ED precautions including fevers, chills, flank pain, inability to tolerate abx or PO Did discuss pending urine culture results urology referral versus renal sono  for imaging due to her recurrent UTI Anemia, unspecified type Pancytopenia noted at recent hospitalization, cell lines had been normalizing. Will repeat CBC and iron studies to assess for anemia, may need hem onc referral if remains pancytopenic as she already had normal smear Essential hypertension Not at goal today on repeat, out of her home medication x2, refills sent     Charmel Cooter, MD [facility]

## 2023-07-16 NOTE — Assessment & Plan Note (Signed)
 Not at goal today on repeat, out of her home medication x2, refills sent

## 2023-07-16 NOTE — Patient Instructions (Addendum)
 It was wonderful to see you today.  Please bring ALL of your medications with you to every visit.   Today we talked about:  I want you to take the cefadroxil  twice daily with food for 7 days. IF you develop fevers, nausea.vomiting and unable to keep down the antibiotic, or severe back pain please call the clinic or go to the ED.  We are checking your blood counts today to see why you are anemic. I will call or message with results.  I sent in a refill of your blood pressure medicine to your pharmacy- keep taking daily.  Thank you for choosing [facility].   Please call [phone] with any questions about today's appointment.  Please arrive at least 15 minutes prior to your scheduled appointments.   If you had blood work today, I will send you a MyChart message or a letter if results are normal. Otherwise, I will give you a call.   If you had a referral placed, they will call you to set up an appointment. Please give us  a call if you don't hear back in the next 2 weeks.   If you need additional refills before your next appointment, please call your pharmacy first.   Avanell Bob, MD  Family Medicine

## 2023-07-17 ENCOUNTER — Encounter: Payer: Self-pay | Admitting: Family Medicine

## 2023-07-18 LAB — CBC WITH DIFFERENTIAL/PLATELET
Basophils Absolute: 0.1 10*3/uL (ref 0.0–0.2)
Basos: 1 %
EOS (ABSOLUTE): 0.1 10*3/uL (ref 0.0–0.4)
Eos: 1 %
Hematocrit: 33.1 % — ABNORMAL LOW (ref 34.0–46.6)
Hemoglobin: 10.4 g/dL — ABNORMAL LOW (ref 11.1–15.9)
Immature Grans (Abs): 0 10*3/uL (ref 0.0–0.1)
Immature Granulocytes: 0 %
Lymphocytes Absolute: 1.1 10*3/uL (ref 0.7–3.1)
Lymphs: 16 %
MCH: 30.6 pg (ref 26.6–33.0)
MCHC: 31.4 g/dL — ABNORMAL LOW (ref 31.5–35.7)
MCV: 97 fL (ref 79–97)
Monocytes Absolute: 0.5 10*3/uL (ref 0.1–0.9)
Monocytes: 8 %
Neutrophils Absolute: 5.1 10*3/uL (ref 1.4–7.0)
Neutrophils: 74 %
Platelets: 153 10*3/uL (ref 150–450)
RBC: 3.4 x10E6/uL — ABNORMAL LOW (ref 3.77–5.28)
RDW: 14.8 % (ref 11.7–15.4)
WBC: 6.7 10*3/uL (ref 3.4–10.8)

## 2023-07-18 LAB — IRON,TIBC AND FERRITIN PANEL
Ferritin: 339 ng/mL — ABNORMAL HIGH (ref 15–150)
Iron Saturation: 80 % (ref 15–55)
Iron: 182 ug/dL — ABNORMAL HIGH (ref 27–159)
Total Iron Binding Capacity: 228 ug/dL — ABNORMAL LOW (ref 250–450)
UIBC: 46 ug/dL — ABNORMAL LOW (ref 131–425)

## 2023-07-18 LAB — HGB FRACTIONATION CASCADE
Hgb A2: 2.2 % (ref 1.8–3.2)
Hgb A: 97.8 % (ref 96.4–98.8)
Hgb F: 0 % (ref 0.0–2.0)
Hgb S: 0 %

## 2023-07-18 LAB — URINE CULTURE

## 2023-07-18 LAB — FOLATE: Folate: 4 ng/mL (ref >3.0)

## 2023-07-25 ENCOUNTER — Telehealth: Payer: Self-pay | Admitting: Family Medicine

## 2023-07-25 DIAGNOSIS — D649 Anemia, unspecified: Secondary | ICD-10-CM

## 2023-07-25 NOTE — Telephone Encounter (Signed)
 Received incoming call from Kristin [name].  Discussed lab work showing elevated iron and to stop her OTC iron pills. Discussed hemonc referral for her chronically elevated ferritin and she is agreeable.   Discussed urine culture showing suspectible to her antbiotics, she notes she started them three days ago. Still having urinary frequency, but denies dysuria, fevers, chills, worsening flank pain. Discussed return precautions.  Kristin Bob MD

## 2023-07-30 ENCOUNTER — Telehealth: Payer: Self-pay

## 2023-07-30 NOTE — Telephone Encounter (Signed)
 Spoke with patient on phone to confirm appt with Dr. Randye Buttner tomorrow at 1:30 pm at [facility] location. Patient agreed to attend appointment.

## 2023-07-31 ENCOUNTER — Other Ambulatory Visit

## 2023-07-31 ENCOUNTER — Encounter: Admitting: Oncology

## 2023-07-31 ENCOUNTER — Inpatient Hospital Stay

## 2023-07-31 ENCOUNTER — Other Ambulatory Visit: Payer: Self-pay

## 2023-07-31 ENCOUNTER — Inpatient Hospital Stay: Admitting: Oncology

## 2023-07-31 ENCOUNTER — Encounter: Payer: Self-pay | Admitting: Nutrition

## 2023-07-31 ENCOUNTER — Inpatient Hospital Stay: Attending: Oncology | Admitting: Oncology

## 2023-07-31 VITALS — BP 132/92 | HR 73 | Temp 98.3°F | Resp 17 | Ht 64.5 in | Wt 173.3 lb

## 2023-07-31 DIAGNOSIS — D649 Anemia, unspecified: Secondary | ICD-10-CM

## 2023-07-31 DIAGNOSIS — K219 Gastro-esophageal reflux disease without esophagitis: Secondary | ICD-10-CM | POA: Insufficient documentation

## 2023-07-31 DIAGNOSIS — I10 Essential (primary) hypertension: Secondary | ICD-10-CM | POA: Insufficient documentation

## 2023-07-31 DIAGNOSIS — Z803 Family history of malignant neoplasm of breast: Secondary | ICD-10-CM | POA: Insufficient documentation

## 2023-07-31 DIAGNOSIS — Z800 Family history of malignant neoplasm of digestive organs: Secondary | ICD-10-CM | POA: Insufficient documentation

## 2023-07-31 DIAGNOSIS — Z5900 Homelessness unspecified: Secondary | ICD-10-CM | POA: Diagnosis not present

## 2023-07-31 DIAGNOSIS — F17210 Nicotine dependence, cigarettes, uncomplicated: Secondary | ICD-10-CM | POA: Diagnosis not present

## 2023-07-31 DIAGNOSIS — R7989 Other specified abnormal findings of blood chemistry: Secondary | ICD-10-CM | POA: Insufficient documentation

## 2023-07-31 DIAGNOSIS — F419 Anxiety disorder, unspecified: Secondary | ICD-10-CM | POA: Insufficient documentation

## 2023-07-31 DIAGNOSIS — Z79899 Other long term (current) drug therapy: Secondary | ICD-10-CM | POA: Diagnosis not present

## 2023-07-31 LAB — DIRECT ANTIGLOBULIN TEST (NOT AT ARMC)
DAT, IgG: NEGATIVE
DAT, complement: NEGATIVE

## 2023-07-31 LAB — CBC WITH DIFFERENTIAL (CANCER CENTER ONLY)
Abs Immature Granulocytes: 0.01 10*3/uL (ref 0.00–0.07)
Basophils Absolute: 0.1 10*3/uL (ref 0.0–0.1)
Basophils Relative: 1 %
Eosinophils Absolute: 0.2 10*3/uL (ref 0.0–0.5)
Eosinophils Relative: 5 %
HCT: 32.5 % — ABNORMAL LOW (ref 36.0–46.0)
Hemoglobin: 10.3 g/dL — ABNORMAL LOW (ref 12.0–15.0)
Immature Granulocytes: 0 %
Lymphocytes Relative: 24 %
Lymphs Abs: 1.2 10*3/uL (ref 0.7–4.0)
MCH: 30.7 pg (ref 26.0–34.0)
MCHC: 31.7 g/dL (ref 30.0–36.0)
MCV: 97 fL (ref 80.0–100.0)
Monocytes Absolute: 0.6 10*3/uL (ref 0.1–1.0)
Monocytes Relative: 12 %
Neutro Abs: 2.8 10*3/uL (ref 1.7–7.7)
Neutrophils Relative %: 58 %
Platelet Count: 143 10*3/uL — ABNORMAL LOW (ref 150–400)
RBC: 3.35 MIL/uL — ABNORMAL LOW (ref 3.87–5.11)
RDW: 15.2 % (ref 11.5–15.5)
WBC Count: 4.8 10*3/uL (ref 4.0–10.5)
nRBC: 0 % (ref 0.0–0.2)

## 2023-07-31 LAB — CMP (CANCER CENTER ONLY)
ALT: 6 U/L (ref 0–44)
AST: 27 U/L (ref 15–41)
Albumin: 4.2 g/dL (ref 3.5–5.0)
Alkaline Phosphatase: 106 U/L (ref 38–126)
Anion gap: 12 (ref 5–15)
BUN: 14 mg/dL (ref 6–20)
CO2: 26 mmol/L (ref 22–32)
Calcium: 9.9 mg/dL (ref 8.9–10.3)
Chloride: 101 mmol/L (ref 98–111)
Creatinine: 0.77 mg/dL (ref 0.44–1.00)
GFR, Estimated: >60 mL/min (ref >60)
Glucose, Bld: 92 mg/dL (ref 70–99)
Potassium: 3.5 mmol/L (ref 3.5–5.1)
Sodium: 139 mmol/L (ref 135–145)
Total Bilirubin: 0.7 mg/dL (ref 0.0–1.2)
Total Protein: 7.7 g/dL (ref 6.5–8.1)

## 2023-07-31 LAB — IRON AND TIBC
Iron: 169 ug/dL (ref 28–170)
Saturation Ratios: 54 % — ABNORMAL HIGH (ref 10.4–31.8)
TIBC: 314 ug/dL (ref 250–450)
UIBC: 145 ug/dL

## 2023-07-31 LAB — FERRITIN: Ferritin: 174 ng/mL (ref 11–307)

## 2023-07-31 LAB — FOLATE: Folate: 7.7 ng/mL (ref >5.9)

## 2023-07-31 LAB — VITAMIN B12: Vitamin B-12: 336 pg/mL (ref 180–914)

## 2023-07-31 LAB — C-REACTIVE PROTEIN: CRP: 0.8 mg/dL (ref <1.0)

## 2023-07-31 LAB — LACTATE DEHYDROGENASE: LDH: 185 U/L (ref 98–192)

## 2023-07-31 LAB — SEDIMENTATION RATE: Sed Rate: 12 mm/h (ref 0–22)

## 2023-07-31 LAB — TSH: TSH: 0.28 u[IU]/mL — ABNORMAL LOW (ref 0.350–4.500)

## 2023-07-31 NOTE — Progress Notes (Signed)
 [facility]  HEMATOLOGY CLINIC CONSULTATION NOTE   PATIENT NAME: Kristin [name]   MR#: [id number] DOB: 30-Jul-1975  DATE OF SERVICE: 07/31/2023  Patient Care Team: Goble Last, MD as PCP - General (Family Medicine)  REASON FOR CONSULTATION/ CHIEF COMPLAINT:  Evaluation of anemia and elevated ferritin  ASSESSMENT & PLAN:   Kristin [name] is a 48 y.o. lady with a past medical history of hypertension, recurrent UTIs, was referred to our service for evaluation of normocytic anemia and also elevated ferritin level  Normocytic anemia Chronic anemia with hemoglobin level of 10.4, slightly low for women. Present for five to six years, previously attributed to uterine bleeding, but post-ablation bleeding should have reduced.   Possible contribution from frequent blood donation.   Labs today showed stable hemoglobin of 10.3, hematocrit 32.5, MCV 97.  White count 4800 with normal differential.  Platelet count 143,000, normal.  CMP unremarkable.  Ferritin normal at 174.  Iron saturation remains elevated at 54%.  B12, folate, haptoglobin, sed rate, CRP were all within normal limits.  Coombs test negative.  - Advise against blood donation due to low hemoglobin levels.  Will follow-up on remaining workup and inform patient of the results over the phone in 2 weeks.  Plan to see her back in 3 months with repeat labs.  Elevated ferritin level Elevated ferritin levels may indicate hemochromatosis, a genetic condition causing excessive iron absorption.  Her iron saturation was also increased at 80% on recent check.    Lack of regular iron supplementation suggests high ferritin is not due to supplementation. Further testing required to confirm diagnosis.  - Order blood work to check for genetic mutations associated with hemochromatosis.  If confirmed to be hemochromatosis, depending on the mutation status, we will aim for ferritin below 50 or below 200, with therapeutic  phlebotomies as needed.  Gastroesophageal reflux disease (GERD) Severe acid reflux confirmed by upper endoscopy. Symptoms are intermittent but can be severe. Uses over-the-counter medications and baking soda for symptom relief.  I reviewed lab results and outside records for this visit and discussed relevant results with the patient. Diagnosis, plan of care and treatment options were also discussed in detail with the patient. Opportunity provided to ask questions and answers provided to her apparent satisfaction. Provided instructions to call our clinic with any problems, questions or concerns prior to return visit. I recommended to continue follow-up with PCP and sub-specialists. She verbalized understanding and agreed with the plan. No barriers to learning was detected.  Maxximus Gotay, MD [facility] A DEPT OF [facility] [address] [place] [address] Dept: [phone] Dept Fax: [phone]  07/31/2023 2:23 PM  HISTORY OF PRESENT ILLNESS:  Discussed the use of AI scribe software for clinical note transcription with the patient, who gave verbal consent to proceed.  History of Present Illness Kristin [name] is a 48 year old female with anemia and high ferritin who presents for evaluation of these conditions.  Labs at her PCPs office on 07/16/2023 showed hemoglobin of 10.4, hematocrit 23.1, MCV 97.  White count and platelet count were within normal limits.  Iron studies showed ferritin of 339, iron saturation increased at 80, iron increased to 882.  With these findings, referral was sent to us  for further evaluation.  She experienced heavy menstrual cycles, which were addressed with a uterine ablation a few years ago, resulting in only light periods since. No other bleeding  issues such as blood in stools, black stools, epistaxis, or gum bleeding are present.  A colonoscopy in August 2021 showed no significant  findings, and an upper endoscopy three months ago revealed acid reflux. She manages her reflux with over-the-counter medications and baking soda, as Tums were ineffective and unpalatable. Reflux symptoms are intermittent but can be severe.  She has been intermittently taking over-the-counter iron supplements, starting about a week ago, but has not been consistent. She has a history of donating plasma for years but was recently advised to stop due to low hemoglobin levels. Anemia has been present since childhood, often feeling cold, and she recalls taking iron pills in the 1990s.  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 She relies on plasma donation for additional financial support, which has been impacted by her anemia.     MEDICAL HISTORY:  Past Medical History:  Diagnosis Date   Acid reflux    ACL tear 2011   not sure which side   Acute lateral meniscus tear of right knee    Anxiety    Arthritis    Back pain    slipped disc lower back lumbar   Bipolar 1 disorder (HCC)    Depression    Dysfunctional uterine bleeding    Eczema    Hypertension    Insomnia    Migraine headache    Neuromuscular disorder (HCC)    leg cramps   Osteoarthritis of right knee 12/21/2014   ACL deficit since 2011   Respiratory distress 01/12/2022   Sepsis with acute hypoxic respiratory failure without septic shock, due to unspecified organism (HCC) 01/06/2022    SURGICAL HISTORY: Past Surgical History:  Procedure Laterality Date   BIOPSY  09/19/2017   Procedure: BIOPSY;  Surgeon: Felecia Hopper, MD;  Location: [facility];  Service: Gastroenterology;;   BIOPSY  10/15/2019   Procedure: BIOPSY;  Surgeon: Baldo Bonds, MD;  Location: [facility] END[facility];  Service: Endoscopy;;   BIOPSY  04/19/2023   Procedure: BIOPSY;  Surgeon: Truddie Furrow, MD;  Location: [facility] ENDOSCOPY;  Service: Gastroenterology;;   COLONOSCOPY  N/A 10/15/2019   Procedure: COLONOSCOPY;  Surgeon: Baldo Bonds, MD;  Location: [facility];  Service: Endoscopy;  Laterality: N/A;   DILITATION & CURRETTAGE/HYSTROSCOPY WITH HYDROTHERMAL ABLATION N/A 04/04/2017   Procedure: DILATATION & CURETTAGE/HYSTEROSCOPY WITH HYDROTHERMAL ABLATION;  Surgeon: Granville Layer, MD;  Location: [facility];  Service: Gynecology;  Laterality: N/A;   ESOPHAGOGASTRODUODENOSCOPY N/A 10/15/2019   Procedure: ESOPHAGOGASTRODUODENOSCOPY (EGD);  Surgeon: Baldo Bonds, MD;  Location: [facility];  Service: Endoscopy;  Laterality: N/A;   ESOPHAGOGASTRODUODENOSCOPY (EGD) WITH PROPOFOL  N/A 09/19/2017   Procedure: ESOPHAGOGASTRODUODENOSCOPY (EGD) WITH PROPOFOL ;  Surgeon: Felecia Hopper, MD;  Location: [facility];  Service: Gastroenterology;  Laterality: N/A;   ESOPHAGOGASTRODUODENOSCOPY (EGD) WITH PROPOFOL  N/A 04/19/2023   Procedure: ESOPHAGOGASTRODUODENOSCOPY (EGD) WITH PROPOFOL ;  Surgeon: Truddie Furrow, MD;  Location: [facility] ENDOSCOPY;  Service: Gastroenterology;  Laterality: N/A;   KNEE ARTHROSCOPY WITH LATERAL MENISECTOMY Right 09/30/2019   Procedure: KNEE ARTHROSCOPY WITH LATERAL MENISECTOMY;  Surgeon: Saundra Curl, MD;  Location: [facility];  Service: Orthopedics;  Laterality: Right;   RIGHT/LEFT HEART CATH AND CORONARY ANGIOGRAPHY N/A 01/09/2022   Procedure: RIGHT/LEFT HEART CATH AND CORONARY ANGIOGRAPHY;  Surgeon: Cody Das, MD;  Location: [facility];  Service: Cardiovascular;  Laterality: N/A;   TUBAL LIGATION  yrs ago  SOCIAL HISTORY: She reports that she has been smoking cigarettes. She has a 1.5 pack-year smoking history. She has been exposed to tobacco smoke. She has never used smokeless tobacco. She reports current alcohol use. She reports that she does not use drugs. Social History   Socioeconomic History   Marital status: Single    Spouse name: Not on file   Number of children: 3    Years of education: Not on file   Highest education level: High school graduate  Occupational History   Not on file  Tobacco Use   Smoking status: Some Days    Current packs/day: 0.30    Average packs/day: 0.3 packs/day for 5.0 years (1.5 ttl pk-yrs)    Types: Cigarettes    Passive exposure: Current   Smokeless tobacco: Never  Vaping Use   Vaping status: Never Used  Substance and Sexual Activity   Alcohol use: Yes    Comment: occassionally, longer than one week ago   Drug use: No   Sexual activity: Yes    Birth control/protection: Surgical  Other Topics Concern   Not on file  Social History Narrative   Not on file   Social Drivers of Health   Financial Resource Strain: Medium Risk (12/05/2017)   Overall Financial Resource Strain (CARDIA)    Difficulty of Paying Living Expenses: Somewhat hard  Food Insecurity: Food Insecurity Present (07/31/2023)   Hunger Vital Sign    Worried About Running Out of Food in the Last Year: Often true    Ran Out of Food in the Last Year: Often true  Transportation Needs: No Transportation Needs (07/31/2023)   PRAPARE - Administrator, Civil Service (Medical): No    Lack of Transportation (Non-Medical): No  Physical Activity: Sufficiently Active (12/05/2017)   Exercise Vital Sign    Days of Exercise per Week: 5 days    Minutes of Exercise per Session: 30 min  Stress: Stress Concern Present (09/22/2020)   [employer] of Occupational Health - Occupational Stress Questionnaire    Feeling of Stress : Very much  Social Connections: Unknown (12/19/2021)   Received from [facility]   Social Network    Social Network: Not on file  Intimate Partner Violence: Not At Risk (07/31/2023)   Humiliation, Afraid, Rape, and Kick questionnaire    Fear of Current or Ex-Partner: No    Emotionally Abused: No    Physically Abused: No    Sexually Abused: No    FAMILY HISTORY: Family History  Problem Relation Age of Onset    Breast cancer Maternal Grandmother    Hypertension Mother    Hypertension Brother    Colon cancer Maternal Uncle    Bipolar disorder Cousin    Schizophrenia Cousin     ALLERGIES:  She is allergic to imitrex [sumatriptan] and banana.  MEDICATIONS:  Current Outpatient Medications  Medication Sig Dispense Refill   Acetaminophen  (TYLENOL  PO) Take 2 tablets by mouth 2 (two) times daily as needed (pain).     cefadroxil  (DURICEF) 500 MG capsule Take 1 capsule (500 mg total) by mouth 2 (two) times daily. 14 capsule 0   ibuprofen  (ADVIL ) 800 MG tablet Take 1 tablet (800 mg total) by mouth every 8 (eight) hours as needed. 21 tablet 0   folic acid  (FOLVITE ) 1 MG tablet Take 1 tablet (1 mg total) by mouth daily. (Patient not taking: Reported on 07/31/2023) 30 tablet 0   gabapentin  (NEURONTIN ) 300 MG capsule Take 1 capsule (300 mg  total) by mouth as directed. Take 1 capsule in the morning, 1 capsule in the afternoon, and 3 capsules at night. (Patient not taking: Reported on 07/31/2023) 90 capsule 1   hydrOXYzine  (ATARAX ) 50 MG tablet Take 50-100 mg by mouth at bedtime as needed. (Patient not taking: Reported on 07/31/2023)     lisinopril -hydrochlorothiazide  (ZESTORETIC ) 10-12.5 MG tablet Take 1 tablet by mouth daily. (Patient not taking: Reported on 07/31/2023) 90 tablet 3   Multiple Vitamins-Minerals (MULTIVITAMIN WOMEN) TABS Take 1 tablet by mouth daily. (Patient not taking: Reported on 07/16/2023)     pantoprazole  (PROTONIX ) 40 MG tablet Take 1 tablet (40 mg total) by mouth 2 (two) times daily before a meal. (Patient not taking: Reported on 07/31/2023) 60 tablet 0   triamcinolone  cream (KENALOG ) 0.1 % Apply 1 Application topically 2 (two) times daily. DO NOT USE ON FACE (Patient not taking: Reported on 07/31/2023) 30 g 0   No current facility-administered medications for this visit.    REVIEW OF SYSTEMS:    Review of Systems  Gastrointestinal:  Positive for nausea. Diarrhea: Intermittent. Genitourinary:          Multiple UTIs  Musculoskeletal:  Positive for arthralgias and back pain.  Neurological:  Positive for numbness.       History of falls    All other pertinent systems were reviewed and were negative except as mentioned above.  PHYSICAL EXAMINATION:   Onc Performance Status - 07/31/23 1352       ECOG Perf Status   ECOG Perf Status Ambulatory and capable of all selfcare but unable to carry out any work activities.  Up and about more than 50% of waking hours      KPS SCALE   KPS % SCORE Normal activity with effort, some s/s of disease             Vitals:   07/31/23 1343  BP: (!) 132/92  Pulse: 73  Resp: 17  Temp: 98.3 F (36.8 C)  SpO2: 100%   Filed Weights   07/31/23 1343  Weight: 173 lb 4.8 oz (78.6 kg)    Physical Exam Constitutional:      General: She is not in acute distress.    Appearance: Normal appearance.  HENT:     Head: Normocephalic and atraumatic.  Cardiovascular:     Rate and Rhythm: Normal rate.  Pulmonary:     Effort: Pulmonary effort is normal. No respiratory distress.  Abdominal:     General: There is no distension.  Neurological:     General: No focal deficit present.     Mental Status: She is alert and oriented to person, place, and time.  Psychiatric:        Mood and Affect: Mood normal.        Behavior: Behavior normal.       LABORATORY DATA:   I have reviewed the data as listed.  Results for orders placed or performed in visit on 07/31/23  TSH  Result Value Ref Range   TSH 0.280 (L) 0.350 - 4.500 uIU/mL  C-reactive protein  Result Value Ref Range   CRP 0.8 <1.0 mg/dL  Sedimentation rate  Result Value Ref Range   Sed Rate 12 0 - 22 mm/hr  Haptoglobin  Result Value Ref Range   Haptoglobin 141 42 - 296 mg/dL  Folate  Result Value Ref Range   Folate 7.7 >5.9 ng/mL  Vitamin B12  Result Value Ref Range   Vitamin B-12 336 180 - 914 pg/mL  Lactate dehydrogenase  Result Value Ref Range   LDH 185 98 - 192 U/L   Ferritin  Result Value Ref Range   Ferritin 174 11 - 307 ng/mL  Iron and TIBC  Result Value Ref Range   Iron 169 28 - 170 ug/dL   TIBC 161 096 - 045 ug/dL   Saturation Ratios 54 (H) 10.4 - 31.8 %   UIBC 145 ug/dL  CMP (Cancer Center only)  Result Value Ref Range   Sodium 139 135 - 145 mmol/L   Potassium 3.5 3.5 - 5.1 mmol/L   Chloride 101 98 - 111 mmol/L   CO2 26 22 - 32 mmol/L   Glucose, Bld 92 70 - 99 mg/dL   BUN 14 6 - 20 mg/dL   Creatinine 4.09 8.11 - 1.00 mg/dL   Calcium  9.9 8.9 - 10.3 mg/dL   Total Protein 7.7 6.5 - 8.1 g/dL   Albumin 4.2 3.5 - 5.0 g/dL   AST 27 15 - 41 U/L   ALT 6 0 - 44 U/L   Alkaline Phosphatase 106 38 - 126 U/L   Total Bilirubin 0.7 0.0 - 1.2 mg/dL   GFR, Estimated >91 >47 mL/min   Anion gap 12 5 - 15  CBC with Differential (Cancer Center Only)  Result Value Ref Range   WBC Count 4.8 4.0 - 10.5 K/uL   RBC 3.35 (L) 3.87 - 5.11 MIL/uL   Hemoglobin 10.3 (L) 12.0 - 15.0 g/dL   HCT 82.9 (L) 56.2 - 13.0 %   MCV 97.0 80.0 - 100.0 fL   MCH 30.7 26.0 - 34.0 pg   MCHC 31.7 30.0 - 36.0 g/dL   RDW 86.5 78.4 - 69.6 %   Platelet Count 143 (L) 150 - 400 K/uL   nRBC 0.0 0.0 - 0.2 %   Neutrophils Relative % 58 %   Neutro Abs 2.8 1.7 - 7.7 K/uL   Lymphocytes Relative 24 %   Lymphs Abs 1.2 0.7 - 4.0 K/uL   Monocytes Relative 12 %   Monocytes Absolute 0.6 0.1 - 1.0 K/uL   Eosinophils Relative 5 %   Eosinophils Absolute 0.2 0.0 - 0.5 K/uL   Basophils Relative 1 %   Basophils Absolute 0.1 0.0 - 0.1 K/uL   Immature Granulocytes 0 %   Abs Immature Granulocytes 0.01 0.00 - 0.07 K/uL  Direct antiglobulin test (not at [facility])  Result Value Ref Range   DAT, complement NEG    DAT, IgG      NEG Performed at [facility], [address], [place], [place] [address]     RADIOGRAPHIC STUDIES:  No recent pertinent imaging studies available to review.  Orders Placed This Encounter  Procedures   CBC with Differential (Cancer Center Only)     Standing Status:   Future    Expiration Date:   07/30/2024   CMP (Cancer Center only)    Standing Status:   Future    Expiration Date:   07/30/2024   Iron and TIBC    Standing Status:   Future    Expiration Date:   07/30/2024   Ferritin    Standing Status:   Future    Expiration Date:   07/30/2024   Lactate dehydrogenase    Standing Status:   Future    Expiration Date:   07/30/2024   Vitamin B12    Standing Status:   Future    Expiration Date:   07/30/2024   Folate    Standing Status:  Future    Expiration Date:   07/30/2024   Haptoglobin    Standing Status:   Future    Expiration Date:   07/30/2024   Sedimentation rate    Standing Status:   Future    Expiration Date:   07/30/2024   C-reactive protein    Standing Status:   Future    Expiration Date:   07/30/2024   TSH    Standing Status:   Future    Expiration Date:   07/30/2024   Hemochromatosis DNA, PCR    Standing Status:   Future    Expiration Date:   07/30/2024   Direct antiglobulin test (not at [facility])    Standing Status:   Future    Expiration Date:   07/30/2024    I spent a total of 55 minutes during this encounter with the patient including review of chart and various tests results, discussions about plan of care and coordination of care plan.  This document was completed utilizing speech recognition software. Grammatical errors, random word insertions, pronoun errors, and incomplete sentences are an occasional consequence of this system due to software limitations, ambient noise, and hardware issues. Any formal questions or concerns about the content, text or information contained within the body of this dictation should be directly addressed to the provider for clarification.

## 2023-07-31 NOTE — Progress Notes (Signed)
Social worker aware. 

## 2023-07-31 NOTE — Progress Notes (Signed)
 Bag food and toiletries given

## 2023-07-31 NOTE — Progress Notes (Signed)
 Social worker notified, [employer]

## 2023-08-01 ENCOUNTER — Telehealth: Payer: Self-pay

## 2023-08-01 ENCOUNTER — Encounter: Payer: Self-pay | Admitting: Oncology

## 2023-08-01 ENCOUNTER — Telehealth: Payer: Self-pay | Admitting: Oncology

## 2023-08-01 DIAGNOSIS — R7989 Other specified abnormal findings of blood chemistry: Secondary | ICD-10-CM | POA: Insufficient documentation

## 2023-08-01 LAB — HAPTOGLOBIN: Haptoglobin: 141 mg/dL (ref 42–296)

## 2023-08-01 NOTE — Telephone Encounter (Signed)
 [facility] Clinical Social Work  Clinical Social Work was referred by nurse for assessment of psychosocial needs.  Clinical Social Worker attempted to contact patient by phone x2 to offer support and assess for needs. Patient has direct contact if needed. Referral will be closed.  Maudie Sorrow, LCSW  Clinical Social Worker [facility]

## 2023-08-01 NOTE — Assessment & Plan Note (Signed)
 Elevated ferritin levels may indicate hemochromatosis, a genetic condition causing excessive iron absorption.  Her iron saturation was also increased at 80% on recent check.    Lack of regular iron supplementation suggests high ferritin is not due to supplementation. Further testing required to confirm diagnosis.  - Order blood work to check for genetic mutations associated with hemochromatosis.  If confirmed to be hemochromatosis, depending on the mutation status, we will aim for ferritin below 50 or below 200, with therapeutic phlebotomies as needed.

## 2023-08-01 NOTE — Telephone Encounter (Signed)
 [facility] Clinical Social Work  Clinical Social Work was referred by Engineer, civil (consulting) for [employer].  Clinical Social Worker attempted to contact patient by phone to offer support and assess for needs. CSW was unable to reach patient, left vm with direct contact.     Maudie Sorrow, LCSW  Clinical Social Worker [facility]

## 2023-08-01 NOTE — Telephone Encounter (Signed)
 Patient has been scheduled for follow-up visit per 08/01/23 LOS.  LVM notifying pt of appt details, provided my direct number to pt if appt changes need to be made.

## 2023-08-01 NOTE — Assessment & Plan Note (Signed)
 Chronic anemia with hemoglobin level of 10.4, slightly low for women. Present for five to six years, previously attributed to uterine bleeding, but post-ablation bleeding should have reduced.   Possible contribution from frequent blood donation.   Labs today showed stable hemoglobin of 10.3, hematocrit 32.5, MCV 97.  White count 4800 with normal differential.  Platelet count 143,000, normal.  CMP unremarkable.  Ferritin normal at 174.  Iron saturation remains elevated at 54%.  B12, folate, haptoglobin, sed rate, CRP were all within normal limits.  Coombs test negative.  - Advise against blood donation due to low hemoglobin levels.  Will follow-up on remaining workup and inform patient of the results over the phone in 2 weeks.  Plan to see her back in 3 months with repeat labs.

## 2023-08-02 ENCOUNTER — Other Ambulatory Visit: Payer: Self-pay

## 2023-08-03 LAB — HEMOCHROMATOSIS DNA-PCR(C282Y,H63D)

## 2023-08-14 ENCOUNTER — Inpatient Hospital Stay: Attending: Oncology | Admitting: Oncology

## 2023-08-14 ENCOUNTER — Encounter: Payer: Self-pay | Admitting: Oncology

## 2023-08-14 DIAGNOSIS — R7989 Other specified abnormal findings of blood chemistry: Secondary | ICD-10-CM

## 2023-08-14 DIAGNOSIS — D649 Anemia, unspecified: Secondary | ICD-10-CM | POA: Diagnosis not present

## 2023-08-14 DIAGNOSIS — Z87440 Personal history of urinary (tract) infections: Secondary | ICD-10-CM | POA: Diagnosis not present

## 2023-08-14 NOTE — Progress Notes (Signed)
 [facility]  HEMATOLOGY-ONCOLOGY ELECTRONIC VISIT PROGRESS NOTE  PATIENT NAME: Kristin [name]   MR#: [id number] DOB: May 06, 1975  DATE OF SERVICE: 08/14/2023  Patient Care Team: Goble Last, MD as PCP - General (Family Medicine)  I connected with the patient via telephone conference and verified that I am speaking with the correct person using two identifiers. The patient's location is at home and I am providing care from the [facility].  I discussed the limitations, risks, security and privacy concerns of performing an evaluation and management service by e-visits and the availability of in person appointments. I also discussed with the patient that there may be a patient responsible charge related to this service. The patient expressed understanding and agreed to proceed.   ASSESSMENT & PLAN:   Kristin [name] is a 48 y.o. lady with a past medical history of hypertension, recurrent UTIs, was referred to our service in May 2025 for evaluation of normocytic anemia and also elevated ferritin level. Workup negative for hemochromatosis.  Normocytic anemia Chronic anemia with hemoglobin level of 10.4, slightly low for women. Present for five to six years, previously attributed to uterine bleeding, but post-ablation bleeding should have reduced.   Possible contribution from frequent blood donation.   On her consultation with us  on 07/31/2023, labs showed stable hemoglobin of 10.3, hematocrit 32.5, MCV 97.  White count 4800 with normal differential.  Platelet count 143,000, normal.  CMP unremarkable.  Ferritin normal at 174.  Iron saturation remains elevated at 54%.  B12, folate, haptoglobin, sed rate, CRP were all within normal limits.  Coombs test negative.   Advise against blood donation due to low hemoglobin levels.  Will check hemoglobin electrophoresis, alpha thalassemia genotype on return visit.  She may have anemia of chronic disease from her medical  comorbidities.  Plan to see her back in 3 months with repeat labs.  Elevated ferritin level Her iron saturation was also increased at 80% on recent check.    On her consultation with us  on 07/31/2023, ferritin was normal at 174 and iron saturation was better at 54%.  Because of previously elevated ferritin level, we checked HFE gene mutation analysis and it was negative.  No evidence of hemochromatosis.  History of recurrent UTIs Recurrent urinary tract infections with dysuria and urgency. Previous antibiotic treatment was ineffective. Possible bladder etiology rather than renal. Discussed potential for long-term low-dose antibiotic therapy with primary care provider. - Discuss long-term low-dose antibiotic therapy with primary care provider.    I discussed the assessment and treatment plan with the patient. The patient was provided an opportunity to ask questions and all were answered. The patient agreed with the plan and demonstrated an understanding of the instructions. The patient was advised to call back or seek an in-person evaluation if the symptoms worsen or if the condition fails to improve as anticipated.    I spent 12 minutes over the phone with the patient reviewing test results, discuss management and coordination/planning of care.  Arlo Berber, MD 08/14/2023 5:20 PM  CANCER CENTER [facility] - A DEPT OF [facility] [address] [place] [address] Dept: [phone] Dept Fax: [phone]   INTERVAL HISTORY:  Please see above for problem oriented charting.  The purpose of today's discussion is to explain recent lab results and to formulate plan of care.  Discussed the use of AI scribe software for clinical note transcription with the patient, who gave verbal consent to  proceed.  History of Present Illness Kristin [name] is a 48 year old female who presents for follow-up of high ferritin and mild anemia.  She was referred for evaluation of high ferritin and mild anemia.  Her ferritin levels were previously elevated, but recent tests show normalization with a level of 174, down from 339 and 317. Hemoglobin remains low at 10.3 g/dL. She frequently feels cold, which she associates with her iron levels. Tests for hemochromatosis were negative, and vitamin B12, folic acid  levels, and inflammation markers are normal.  She has a history of recurrent urinary tract infections (UTIs) and frequent urination, which impacts her daily life as she cannot always access restrooms at work. Antibiotics have been ineffective and may have worsened her symptoms. She recalls being told by a healthcare provider that her 'kidneys were asleep,' but recent tests show normal kidney function.    SUMMARY OF HEMATOLOGY HISTORY:  Labs at her PCPs office on 07/16/2023 showed hemoglobin of 10.4, hematocrit 23.1, MCV 97.  White count and platelet count were within normal limits.  Iron studies showed ferritin of 339, iron saturation increased at 80, iron increased to 882.  With these findings, referral was sent to us  for further evaluation.   She experienced heavy menstrual cycles, which were addressed with a uterine ablation a few years ago, resulting in only light periods since. No other bleeding issues such as blood in stools, black stools, epistaxis, or gum bleeding are present.   A colonoscopy in August 2021 showed no significant findings, and an upper endoscopy three months ago revealed acid reflux. She manages her reflux with over-the-counter medications and baking soda, as Tums were ineffective and unpalatable. Reflux symptoms are intermittent but can be severe.   She has been intermittently taking over-the-counter iron supplements, starting about a week ago, but has not been consistent. She has a history of donating plasma for years but was recently advised to stop due to low hemoglobin levels. Anemia has been present since  childhood, often feeling cold, and she recalls taking iron pills in the 1990s.   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 She relies on plasma donation for additional financial support, which has been impacted by her anemia.  Chronic anemia with hemoglobin level of 10.4, slightly low for women. Present for five to six years, previously attributed to uterine bleeding, but post-ablation bleeding should have reduced.    Possible contribution from frequent blood donation.    On her consultation with us  on 07/31/2023, labs showed stable hemoglobin of 10.3, hematocrit 32.5, MCV 97.  White count 4800 with normal differential.  Platelet count 143,000, normal.  CMP unremarkable.  Ferritin normal at 174.  Iron saturation remains elevated at 54%.  B12, folate, haptoglobin, sed rate, CRP were all within normal limits.  Coombs test negative.   Advise against blood donation due to low hemoglobin levels.  Will check hemoglobin electrophoresis, alpha thalassemia genotype on return visit.  She may have anemia of chronic disease from her medical comorbidities.  Because of previously elevated ferritin level and iron saturation level, we checked HFE gene mutation analysis and it was negative.  No evidence of hemochromatosis.   REVIEW OF SYSTEMS:    Review of Systems - Oncology  All other pertinent systems were reviewed with the patient and are negative.  I have reviewed the past medical history, past surgical history, social history and family history with  the patient and they are unchanged from previous note.  ALLERGIES:  She is allergic to imitrex [sumatriptan] and banana.  MEDICATIONS:  Current Outpatient Medications  Medication Sig Dispense Refill   Acetaminophen  (TYLENOL  PO) Take 2 tablets by mouth 2 (two) times daily as needed (pain).     cefadroxil  (DURICEF) 500 MG capsule Take 1 capsule (500 mg total) by mouth 2  (two) times daily. 14 capsule 0   folic acid  (FOLVITE ) 1 MG tablet Take 1 tablet (1 mg total) by mouth daily. (Patient not taking: Reported on 07/31/2023) 30 tablet 0   gabapentin  (NEURONTIN ) 300 MG capsule Take 1 capsule (300 mg total) by mouth as directed. Take 1 capsule in the morning, 1 capsule in the afternoon, and 3 capsules at night. (Patient not taking: Reported on 07/31/2023) 90 capsule 1   hydrOXYzine  (ATARAX ) 50 MG tablet Take 50-100 mg by mouth at bedtime as needed. (Patient not taking: Reported on 07/31/2023)     ibuprofen  (ADVIL ) 800 MG tablet Take 1 tablet (800 mg total) by mouth every 8 (eight) hours as needed. 21 tablet 0   lisinopril -hydrochlorothiazide  (ZESTORETIC ) 10-12.5 MG tablet Take 1 tablet by mouth daily. (Patient not taking: Reported on 07/31/2023) 90 tablet 3   Multiple Vitamins-Minerals (MULTIVITAMIN WOMEN) TABS Take 1 tablet by mouth daily. (Patient not taking: Reported on 07/16/2023)     pantoprazole  (PROTONIX ) 40 MG tablet Take 1 tablet (40 mg total) by mouth 2 (two) times daily before a meal. (Patient not taking: Reported on 07/31/2023) 60 tablet 0   triamcinolone  cream (KENALOG ) 0.1 % Apply 1 Application topically 2 (two) times daily. DO NOT USE ON FACE (Patient not taking: Reported on 07/31/2023) 30 g 0   No current facility-administered medications for this visit.    PHYSICAL EXAMINATION:    Onc Performance Status - 08/14/23 1700       ECOG Perf Status   ECOG Perf Status Restricted in physically strenuous activity but ambulatory and able to carry out work of a light or sedentary nature, e.g., light house work, office work      KPS SCALE   KPS % SCORE Normal activity with effort, some s/s of disease             LABORATORY DATA:   I have reviewed the data as listed.  Recent Results (from the past 2160 hours)  CBC with Differential     Status: Abnormal   Collection Time: 05/18/23  5:07 PM  Result Value Ref Range   WBC 3.0 (L) 3.4 - 10.8 x10E3/uL   RBC  3.26 (L) 3.77 - 5.28 x10E6/uL   Hemoglobin 10.1 (L) 11.1 - 15.9 g/dL   Hematocrit 13.0 (L) 86.5 - 46.6 %   MCV 98 (H) 79 - 97 fL   MCH 31.0 26.6 - 33.0 pg   MCHC 31.6 31.5 - 35.7 g/dL   RDW 78.4 69.6 - 29.5 %   Platelets 140 (L) 150 - 450 x10E3/uL   Neutrophils 47 Not Estab. %   Lymphs 34 Not Estab. %   Monocytes 12 Not Estab. %   Eos 5 Not Estab. %   Basos 2 Not Estab. %   Neutrophils Absolute 1.4 1.4 - 7.0 x10E3/uL   Lymphocytes Absolute 1.0 0.7 - 3.1 x10E3/uL   Monocytes Absolute 0.4 0.1 - 0.9 x10E3/uL   EOS (ABSOLUTE) 0.2 0.0 - 0.4 x10E3/uL   Basophils Absolute 0.1 0.0 - 0.2 x10E3/uL   Immature Granulocytes 0 Not Estab. %   Immature Grans (Abs)  0.0 0.0 - 0.1 x10E3/uL  Basic Metabolic Panel     Status: Abnormal   Collection Time: 05/18/23  5:07 PM  Result Value Ref Range   Glucose 92 70 - 99 mg/dL   BUN 6 6 - 24 mg/dL   Creatinine, Ser 3.08 0.57 - 1.00 mg/dL   eGFR 83 >65 HQ/ION/6.29   BUN/Creatinine Ratio 7 (L) 9 - 23   Sodium 140 134 - 144 mmol/L   Potassium 3.7 3.5 - 5.2 mmol/L   Chloride 101 96 - 106 mmol/L   CO2 23 20 - 29 mmol/L   Calcium  9.1 8.7 - 10.2 mg/dL  PPD     Status: None   Collection Time: 05/24/23  3:14 PM  Result Value Ref Range   TB Skin Test Negative    Induration 0 mm  Urinalysis     Status: Abnormal   Collection Time: 06/07/23  4:44 PM  Result Value Ref Range   Specific Gravity, UA 1.021 1.005 - 1.030   pH, UA 5.5 5.0 - 7.5   Color, UA Yellow Yellow   Appearance Ur Clear Clear   Leukocytes,UA 2+ (A) Negative   Protein,UA 1+ (A) Negative/Trace   Glucose, UA Negative Negative   Ketones, UA Trace (A) Negative   RBC, UA Negative Negative   Bilirubin, UA CANCELED     Comment: Test not performed. Unable to perform test due to current unavailability of reagents.  Result canceled by the ancillary.    Urobilinogen, Ur 1.0 0.2 - 1.0 mg/dL   Nitrite, UA Negative Negative  Urine Culture     Status: Abnormal   Collection Time: 06/08/23 11:44 AM    Specimen: Urine   UR  Result Value Ref Range   Urine Culture, Routine Final report (A)    Organism ID, Bacteria Klebsiella pneumoniae (A)     Comment: Cefazolin  with an MIC <=16 predicts susceptibility to the oral agents cefaclor, cefdinir , cefpodoxime , cefprozil, cefuroxime , cephalexin , and loracarbef when used for therapy of uncomplicated urinary tract infections due to E. coli, Klebsiella pneumoniae, and Proteus mirabilis. Greater than 100,000 colony forming units per mL    Antimicrobial Susceptibility Comment     Comment:       ** S = Susceptible; I = Intermediate; R = Resistant **                    P = Positive; N = Negative             MICS are expressed in micrograms per mL    Antibiotic                 RSLT#1    RSLT#2    RSLT#3    RSLT#4 Amoxicillin /Clavulanic Acid    S Ampicillin                     R Cefazolin                       S Cefepime                       S Cefoxitin                      S Cefpodoxime                     S Ceftriaxone   S Ciprofloxacin                   S Ertapenem                      S Gentamicin                     S Levofloxacin                   S Meropenem                      S Nitrofurantoin                  R Piperacillin/Tazobactam        S Tetracycline                   S Tobramycin                     S Trimethoprim /Sulfa              S   POCT urinalysis dipstick     Status: Abnormal   Collection Time: 07/05/23  4:10 PM  Result Value Ref Range   Color, UA yellow yellow   Clarity, UA clear clear   Glucose, UA negative negative mg/dL   Bilirubin, UA small (A) negative   Ketones, POC UA trace (5) (A) negative mg/dL   Spec Grav, UA 6.578 4.696 - 1.025   Blood, UA negative negative   pH, UA 5.5 5.0 - 8.0   Protein Ur, POC =30 (A) negative mg/dL   Urobilinogen, UA 1.0 0.2 or 1.0 E.U./dL   Nitrite, UA Positive (A) Negative   Leukocytes, UA Small (1+) (A) Negative  Hemoglobin     Status: Abnormal   Collection  Time: 07/16/23 10:52 AM  Result Value Ref Range   Hemoglobin 8.8 (A) 11 - 14.6 g/dL  POCT urinalysis dipstick     Status: Abnormal   Collection Time: 07/16/23 10:56 AM  Result Value Ref Range   Color, UA yellow yellow   Clarity, UA clear clear   Glucose, UA negative negative mg/dL   Bilirubin, UA negative negative   Ketones, POC UA negative negative mg/dL   Spec Grav, UA 2.952 8.413 - 1.025   Blood, UA trace-lysed (A) negative   pH, UA 6.0 5.0 - 8.0   Protein Ur, POC =30 (A) negative mg/dL   Urobilinogen, UA 0.2 0.2 or 1.0 E.U./dL   Nitrite, UA Positive (A) Negative   Leukocytes, UA Small (1+) (A) Negative  Urine Culture     Status: Abnormal   Collection Time: 07/16/23 11:00 AM   Specimen: Urine   UR  Result Value Ref Range   Urine Culture, Routine Final report (A)    Organism ID, Bacteria Klebsiella pneumoniae (A)     Comment: Cefazolin  with an MIC <=16 predicts susceptibility to the oral agents cefaclor, cefdinir , cefpodoxime , cefprozil, cefuroxime , cephalexin , and loracarbef when used for therapy of uncomplicated urinary tract infections due to E. coli, Klebsiella pneumoniae, and Proteus mirabilis. Greater than 100,000 colony forming units per mL    Antimicrobial Susceptibility Comment     Comment:       ** S = Susceptible; I = Intermediate; R = Resistant **                    P = Positive; N = Negative  MICS are expressed in micrograms per mL    Antibiotic                 RSLT#1    RSLT#2    RSLT#3    RSLT#4 Amoxicillin /Clavulanic Acid    S Ampicillin                     R Cefazolin                       S Cefepime                       S Cefoxitin                      S Cefpodoxime                     S Ceftriaxone                     S Ciprofloxacin                   S Ertapenem                      S Gentamicin                     S Levofloxacin                   I Meropenem                      S Nitrofurantoin                  R Piperacillin/Tazobactam         S Tetracycline                   S Tobramycin                     S Trimethoprim /Sulfa              S   CBC with Differential     Status: Abnormal   Collection Time: 07/16/23 11:12 AM  Result Value Ref Range   WBC 6.7 3.4 - 10.8 x10E3/uL   RBC 3.40 (L) 3.77 - 5.28 x10E6/uL   Hemoglobin 10.4 (L) 11.1 - 15.9 g/dL   Hematocrit 86.5 (L) 78.4 - 46.6 %   MCV 97 79 - 97 fL   MCH 30.6 26.6 - 33.0 pg   MCHC 31.4 (L) 31.5 - 35.7 g/dL   RDW 69.6 29.5 - 28.4 %   Platelets 153 150 - 450 x10E3/uL   Neutrophils 74 Not Estab. %   Lymphs 16 Not Estab. %   Monocytes 8 Not Estab. %   Eos 1 Not Estab. %   Basos 1 Not Estab. %   Neutrophils Absolute 5.1 1.4 - 7.0 x10E3/uL   Lymphocytes Absolute 1.1 0.7 - 3.1 x10E3/uL   Monocytes Absolute 0.5 0.1 - 0.9 x10E3/uL   EOS (ABSOLUTE) 0.1 0.0 - 0.4 x10E3/uL   Basophils Absolute 0.1 0.0 - 0.2 x10E3/uL   Immature Granulocytes 0 Not Estab. %   Immature Grans (Abs) 0.0 0.0 - 0.1 x10E3/uL  Folate     Status: None   Collection Time: 07/16/23 11:12 AM  Result Value Ref Range   Folate 4.0 >3.0 ng/mL    Comment: A serum  folate concentration of less than 3.1 ng/mL is considered to represent clinical deficiency.   Hgb Fractionation Cascade     Status: None   Collection Time: 07/16/23 11:12 AM  Result Value Ref Range   Hgb F 0.0 0.0 - 2.0 %   Hgb A 97.8 96.4 - 98.8 %   Hgb A2 2.2 1.8 - 3.2 %   Hgb S 0.0 0.0 %   Interpretation, Hgb Fract Comment     Comment: Normal hemoglobin present; no hemoglobin variant or beta thalassemia identified. Note: Alpha thalassemia may not be detected by the Hgb Fractionation Cascade panel. If alpha thalassemia is suspected, [facility] offers Alpha-Thalassemia DNA Analysis [phone]).   Iron, TIBC and Ferritin Panel     Status: Abnormal   Collection Time: 07/16/23 11:12 AM  Result Value Ref Range   Total Iron Binding Capacity 228 (L) 250 - 450 ug/dL   UIBC 46 (L) 063 - 016 ug/dL   Iron 010 (H) 27 - 932 ug/dL   Iron  Saturation 80 (HH) 15 - 55 %   Ferritin 339 (H) 15 - 150 ng/mL  Hemochromatosis DNA, PCR     Status: None   Collection Time: 07/31/23  2:40 PM  Result Value Ref Range   DNA Mutation Analysis Comment     Comment: (NOTE) Result: c.845G>A (p.Cys282Tyr) - Not Detected c.187C>G (p.His63Asp) - Not Detected c.193A>T (p.Ser65Cys) - Not Detected Not associated with increased risk to develop clinical symptoms of Hereditary Hemochromatosis. In symptomatic individuals, other causes of iron overload should be evaluated. See Additional Information and Comments. Additional Clinical Information: Hereditary hemochromatosis (HFE related) is an autosomal recessive iron storage disorder. Patients may have a genetic diagnosis of hereditary hemochromatosis and never show clinical symptoms. Clinical symptoms typically appear between 40 to 60 years in males and after menopause in females. Signs and symptoms may include organ damage, primarily in the liver, risk for hepatocellular carcinoma, diabetes, and heart disease due to iron accumulation. Life expectancy may be decreased in individuals who develop cirrhosis. Treatment for clinically symptomatic individuals may include therapeutic phlebotomy. L iver transplant may be used to treat end stage liver failure. For preventive care, monitoring for iron overload is recommended for patients who are homozygous for c.845G>A (p.Cys282Tyr) and have yet to experience clinical symptoms. Comments: The most common HFE variants associated with hereditary hemochromatosis are c.845G>A (p.Cys282Tyr), c.187C>G (p.His63Asp), c.193A>T (p.Ser65Cys). While patients homozygous for c.845G>A (p.Cys282Tyr) are the most likely to present clinical symptoms, less than 10% develop clinically significant iron overload with tissue and organ damage. Genetic counseling is recommended to discuss the potential clinical implications of positive results, as well as recommendations  for testing family members. Genetic Coordinators are available for health care providers to discuss results at [phone]). Test Details: Three variants analyzed: c.845G>A (p.Cys282Tyr), commonly referred to as C282Y c.187C>G (p.His63Asp), commonly referred to a s H63D c.193A>T (p.Ser65Cys), commonly referred to as S65C Methods/Limitations: DNA Analysis of the HFE gene (NM_[id number]) was performed by PCR amplification followed by restriction enzyme digestion analyses. Results must be combined with clinical information for the most accurate interpretation. Molecular-based testing is highly accurate, but as in any laboratory test, diagnostic errors may occur. False positive or false negative results may occur for reasons that include genetic variants, blood transfusions, bone marrow transplantation, somatic or tissue-specific mosaicism, mislabeled samples, or erroneous representation of family relationships. This test was developed and its performance characteristics determined by [facility]. It has not been cleared or approved by the Food and Drug Administration. References:  [address] Adams PC, Kowdley KV, Powell LW, Tavill AS; American Association for the Study of Liver Diseases. Diagnosis and management of hemochromatosis: 2011  practice guideline by the American Association for the Study of Liver Diseases. Hepatology. 2011 Jul;54(1):328-43. doi: 10.1002/hep.24330. PMID: 16109604; PMCID: [id number]. [address] Brissot P, Swinkels DW, Zoller H, Kamarainen O, Patton S, Alonso I, Morris M, Keeney S. EMQN best practice guidelines for the molecular genetic diagnosis of hereditary hemochromatosis [facility]). Eur J Hum Genet. 2016 Apr;24(4):479-95. doi: 10.1038/ejhg.2015.128. Epub 2015 Jul 8. PMID: 78295621; PMCID: [id number].    Reviewed by: Comment     Comment: (NOTE) Technical Component performed at [employer] [facility] Professional Component performed by: W. Andrew Banister, PhD,  [facility], [facility], [address] [place] [address] Performed At: [facility] [address] [place], [place] [id number] Adams Adams MDPhD UU:[id number]   TSH     Status: Abnormal   Collection Time: 07/31/23  2:40 PM  Result Value Ref Range   TSH 0.280 (L) 0.350 - 4.500 uIU/mL    Comment: Performed at [facility], [address] [place], [place] [address]  C-reactive protein     Status: None   Collection Time: 07/31/23  2:40 PM  Result Value Ref Range   CRP 0.8 <1.0 mg/dL    Comment: Performed at [facility], [address], [place], [place] [address]  Sedimentation rate     Status: None   Collection Time: 07/31/23  2:40 PM  Result Value Ref Range   Sed Rate 12 0 - 22 mm/hr    Comment: Performed at [facility], [address] [place], [place] [address]  Direct antiglobulin test (not at [facility])     Status: None   Collection Time: 07/31/23  2:40 PM  Result Value Ref Range   DAT, complement NEG    DAT, IgG      NEG Performed at [facility], [address] [place], [place] [address]   Haptoglobin     Status: None   Collection Time: 07/31/23  2:40 PM  Result Value Ref Range   Haptoglobin 141 42 - 296 mg/dL    Comment: (NOTE) Performed At: [facility] [address] [place], [place] [id number] Pearlean Botts MD KZ:[id number]   Folate     Status: None   Collection Time: 07/31/23  2:40 PM  Result Value Ref Range   Folate 7.7 >5.9 ng/mL    Comment: Performed at [facility], [address], [place], [place] [address]  Vitamin B12     Status: None   Collection Time: 07/31/23  2:40 PM  Result Value Ref Range   Vitamin B-12 336 180 - 914 pg/mL    Comment: (NOTE) This assay is not validated for testing neonatal or myeloproliferative syndrome specimens for Vitamin B12 levels. Performed at [facility], [address], [place], [place] [address]   Lactate dehydrogenase      Status: None   Collection Time: 07/31/23  2:40 PM  Result Value Ref Range   LDH 185 98 - 192 U/L    Comment: Performed at [facility], [address], [place], [place] [address]  Ferritin     Status: None   Collection Time: 07/31/23  2:40 PM  Result Value Ref Range   Ferritin 174 11 - 307 ng/mL    Comment: Performed at [facility], [address], [place], [place] [address]  Iron and TIBC  Status: Abnormal   Collection Time: 07/31/23  2:40 PM  Result Value Ref Range   Iron 169 28 - 170 ug/dL   TIBC 454 098 - 119 ug/dL   Saturation Ratios 54 (H) 10.4 - 31.8 %   UIBC 145 ug/dL    Comment: Performed at [facility], [address] [place], [place] [address]  CMP (Cancer Center only)     Status: None   Collection Time: 07/31/23  2:40 PM  Result Value Ref Range   Sodium 139 135 - 145 mmol/L   Potassium 3.5 3.5 - 5.1 mmol/L   Chloride 101 98 - 111 mmol/L   CO2 26 22 - 32 mmol/L   Glucose, Bld 92 70 - 99 mg/dL    Comment: Glucose reference range applies only to samples taken after fasting for at least 8 hours.   BUN 14 6 - 20 mg/dL   Creatinine 9.56 2.13 - 1.00 mg/dL   Calcium  9.9 8.9 - 10.3 mg/dL   Total Protein 7.7 6.5 - 8.1 g/dL   Albumin 4.2 3.5 - 5.0 g/dL   AST 27 15 - 41 U/L   ALT 6 0 - 44 U/L   Alkaline Phosphatase 106 38 - 126 U/L   Total Bilirubin 0.7 0.0 - 1.2 mg/dL   GFR, Estimated >08 >65 mL/min    Comment: (NOTE) Calculated using the CKD-EPI Creatinine Equation (2021)    Anion gap 12 5 - 15    Comment: Performed at [facility], [address] [place], [place] [address]  CBC with Differential (Cancer Center Only)     Status: Abnormal   Collection Time: 07/31/23  2:40 PM  Result Value Ref Range   WBC Count 4.8 4.0 - 10.5 K/uL   RBC 3.35 (L) 3.87 - 5.11 MIL/uL   Hemoglobin 10.3 (L) 12.0 - 15.0 g/dL   HCT 62.9 (L) 52.8 - 41.3 %   MCV 97.0 80.0 - 100.0 fL   MCH 30.7 26.0 - 34.0 pg   MCHC 31.7  30.0 - 36.0 g/dL   RDW 24.4 01.0 - 27.2 %   Platelet Count 143 (L) 150 - 400 K/uL   nRBC 0.0 0.0 - 0.2 %   Neutrophils Relative % 58 %   Neutro Abs 2.8 1.7 - 7.7 K/uL   Lymphocytes Relative 24 %   Lymphs Abs 1.2 0.7 - 4.0 K/uL   Monocytes Relative 12 %   Monocytes Absolute 0.6 0.1 - 1.0 K/uL   Eosinophils Relative 5 %   Eosinophils Absolute 0.2 0.0 - 0.5 K/uL   Basophils Relative 1 %   Basophils Absolute 0.1 0.0 - 0.1 K/uL   Immature Granulocytes 0 %   Abs Immature Granulocytes 0.01 0.00 - 0.07 K/uL    Comment: Performed at [facility], [address] [place], [place] [address]     RADIOGRAPHIC STUDIES:  No recent pertinent imaging studies available to review.  Orders Placed This Encounter  Procedures   Hgb Fractionation Cascade    Standing Status:   Future    Expected Date:   11/05/2023    Expiration Date:   08/13/2024   Alpha-Thalassemia GenotypR    Standing Status:   Future    Expected Date:   11/05/2023    Expiration Date:   08/13/2024   CBC with Differential (Cancer Center Only)    Standing Status:   Future    Expected Date:   11/05/2023    Expiration Date:   08/13/2024  Iron and TIBC    Standing Status:   Future    Expected Date:   11/05/2023    Expiration Date:   08/13/2024   Ferritin    Standing Status:   Future    Expected Date:   11/05/2023    Expiration Date:   08/13/2024     Future Appointments  Date Time Provider Department Center  11/05/2023 10:30 AM [facility] PHLEBOTOMIST [facility] None  11/05/2023 10:45 AM Shenicka Sunderlin, Gale Jude, MD [facility] None    This document was completed utilizing speech recognition software. Grammatical errors, random word insertions, pronoun errors, and incomplete sentences are an occasional consequence of this system due to software limitations, ambient noise, and hardware issues. Any formal questions or concerns about the content, text or information contained within the body of this dictation should be directly addressed  to the provider for clarification.

## 2023-08-14 NOTE — Assessment & Plan Note (Signed)
 Chronic anemia with hemoglobin level of 10.4, slightly low for women. Present for five to six years, previously attributed to uterine bleeding, but post-ablation bleeding should have reduced.   Possible contribution from frequent blood donation.   On her consultation with us  on 07/31/2023, labs showed stable hemoglobin of 10.3, hematocrit 32.5, MCV 97.  White count 4800 with normal differential.  Platelet count 143,000, normal.  CMP unremarkable.  Ferritin normal at 174.  Iron saturation remains elevated at 54%.  B12, folate, haptoglobin, sed rate, CRP were all within normal limits.  Coombs test negative.   Advise against blood donation due to low hemoglobin levels.  Will check hemoglobin electrophoresis, alpha thalassemia genotype on return visit.  She may have anemia of chronic disease from her medical comorbidities.  Plan to see her back in 3 months with repeat labs.

## 2023-08-14 NOTE — Assessment & Plan Note (Signed)
 Her iron saturation was also increased at 80% on recent check.    On her consultation with us  on 07/31/2023, ferritin was normal at 174 and iron saturation was better at 54%.  Because of previously elevated ferritin level, we checked HFE gene mutation analysis and it was negative.  No evidence of hemochromatosis.

## 2023-08-14 NOTE — Assessment & Plan Note (Signed)
 Recurrent urinary tract infections with dysuria and urgency. Previous antibiotic treatment was ineffective. Possible bladder etiology rather than renal. Discussed potential for long-term low-dose antibiotic therapy with primary care provider. - Discuss long-term low-dose antibiotic therapy with primary care provider.

## 2023-08-23 ENCOUNTER — Encounter: Admitting: Family Medicine

## 2023-08-23 NOTE — Progress Notes (Deleted)
    SUBJECTIVE:   CHIEF COMPLAINT / HPI:   UTI ***   Leg Swelling ***   PERTINENT  PMH / PSH: Reviewed. BPD I Recurrent UTI, has had AKI  OBJECTIVE:   There were no vitals taken for this visit.  ***  ASSESSMENT/PLAN:   Assessment & Plan      Omar Bibber, DO [facility]

## 2023-08-23 NOTE — Progress Notes (Signed)
ERRONEOUS

## 2023-08-24 ENCOUNTER — Other Ambulatory Visit (HOSPITAL_COMMUNITY)
Admission: RE | Admit: 2023-08-24 | Discharge: 2023-08-24 | Disposition: A | Discharge status: Home / Self Care | Source: Ambulatory Visit | Attending: Family Medicine | Admitting: Family Medicine

## 2023-08-24 ENCOUNTER — Encounter: Payer: Self-pay | Admitting: Student

## 2023-08-24 ENCOUNTER — Ambulatory Visit (INDEPENDENT_AMBULATORY_CARE_PROVIDER_SITE_OTHER)

## 2023-08-24 VITALS — BP 133/84 | HR 91 | Ht 64.5 in | Wt 173.4 lb

## 2023-08-24 DIAGNOSIS — Z124 Encounter for screening for malignant neoplasm of cervix: Secondary | ICD-10-CM

## 2023-08-24 DIAGNOSIS — Z113 Encounter for screening for infections with a predominantly sexual mode of transmission: Secondary | ICD-10-CM

## 2023-08-24 DIAGNOSIS — N390 Urinary tract infection, site not specified: Secondary | ICD-10-CM | POA: Diagnosis not present

## 2023-08-24 DIAGNOSIS — Z87440 Personal history of urinary (tract) infections: Secondary | ICD-10-CM

## 2023-08-24 DIAGNOSIS — N898 Other specified noninflammatory disorders of vagina: Secondary | ICD-10-CM

## 2023-08-24 DIAGNOSIS — R300 Dysuria: Secondary | ICD-10-CM | POA: Diagnosis not present

## 2023-08-24 DIAGNOSIS — A599 Trichomoniasis, unspecified: Secondary | ICD-10-CM

## 2023-08-24 LAB — POCT URINALYSIS DIP (MANUAL ENTRY)
Bilirubin, UA: NEGATIVE
Glucose, UA: NEGATIVE mg/dL
Nitrite, UA: POSITIVE — AB
Protein Ur, POC: NEGATIVE mg/dL
Spec Grav, UA: 1.02 (ref 1.010–1.025)
Urobilinogen, UA: 1 U/dL
pH, UA: 5.5 (ref 5.0–8.0)

## 2023-08-24 LAB — POCT WET PREP (WET MOUNT): Clue Cells Wet Prep Whiff POC: NEGATIVE

## 2023-08-24 MED ORDER — FLUCONAZOLE 150 MG PO TABS: 150.0000 mg | ORAL_TABLET | Freq: Once | ORAL | 0 refills | Status: AC

## 2023-08-24 MED ORDER — METRONIDAZOLE 500 MG PO TABS: 500.0000 mg | ORAL_TABLET | Freq: Two times a day (BID) | ORAL | 0 refills | Status: AC

## 2023-08-24 MED ORDER — CEFADROXIL 500 MG PO CAPS: 500.0000 mg | ORAL_CAPSULE | Freq: Two times a day (BID) | ORAL | 0 refills | Status: DC

## 2023-08-24 NOTE — Patient Instructions (Signed)
 It was great to see you! Thank you for allowing me to participate in your care!  I recommend that you always bring your medications to each appointment as this makes it easy to ensure you are on the correct medications and helps us  not miss when refills are needed.  Our plans for today:  - We referred you to urologist due to recurrent UTIs.  You will be called to schedule an appointment -Cefadroxil  has been sent to your pharmacy for urinary tract infection - Metronidazole  has been sent to your pharmacy to treat for trichomonas infection.  You should tell any sexual partners about the infection and refrain from sexual activity for 2 weeks after treatment - I recommend condom use to prevent STIs. - We are also testing you for other STIs -Diflucan  sent to pharmacy to take after completing antibiotics to prevent yeast infection   We are checking some labs today, I will call you if they are abnormal will send you a MyChart message or a letter if they are normal.  If you do not hear about your labs in the next 2 weeks please let us  know.  Take care and seek immediate care sooner if you develop any concerns.   Dr. Glenn Lange, DO [facility]

## 2023-08-24 NOTE — Progress Notes (Unsigned)
    SUBJECTIVE:   CHIEF COMPLAINT / HPI:   The patient presents with symptoms of a urinary tract infection and vaginal irritation.  She experiences a white discharge and vaginal irritation following a course of cefadroxil  for a urinary tract infection, persisting for the past few days.  The urinary symptoms resolved after completing the antibiotics but have now returned.  There is no increased odor, but an 'egg smelly' odor is noted during urination. She began experiencing dysuria yesterday. No new nausea, abdominal pain or fevers.  She had a new sexual encounter about a month ago and did not use condoms.  Had tubal ligation for birth control.  PERTINENT  PMH / PSH: HFrEF, HTN, bipolar 1 disorder, recurrent UTIs  OBJECTIVE:   BP 133/84   Pulse 91   Ht 5' 4.5 (1.638 m)   Wt 173 lb 6.4 oz (78.7 kg)   SpO2 99%   BMI 29.30 kg/m    General: NAD, pleasant, able to participate in exam Cardiac: Well-perfused Respiratory: Normal effort Abdomen: Bowel sounds present, nontender, nondistended, soft Extremities: no edema or cyanosis. GU: Chaperoned by CMA.  Very small pink papule with surrounding erythema at top right of clitoral hood, slightly tender to touch, does not appear vesicular with no draining fluid.  Moist pink vaginal mucosa, cervix with scattered small erythematous lesions and friable with small amt of bleeding from os during swab. No CTM.  Skin: warm and dry Neuro: alert, no obvious focal deficits Psych: Normal affect and mood  ASSESSMENT/PLAN:   Recurrent UTI Recurrent UTIs with dysuria and positive with leuks and nitrites today. Previous cefadroxil  use effective.  - Prescribe cefadroxil . - Refer to urology for evaluation of recurrent UTIs, may need low dose abx suppressive therapy   Trichomonas infection Trichomoniasis confirmed on wet prep and has strawberry cervix on exam.  No symptoms of PID currently. - Prescribe metronidazole  and diflucan  to prevent yeast  infection - Advise informing sexual partners and abstaining from sexual activity for two weeks post-treatment. - Recommend condom use to prevent STIs. - Test for gonorrhea, chlamydia, HIV, and syphilis. - Considering discussing PrEP therapy at next visit - Recommend test for re infection in ~ 3 months   Vaginal lesion Not vesicular but a small papule. Not consistent with HSV or warts. Slightly tender. Will monitor and consider GYN referral if persistent. Pt to return in 1-2 weeks for f/u.    Pap smear completed today   Dr. Glenn Lange, DO [facility]

## 2023-08-25 DIAGNOSIS — A599 Trichomoniasis, unspecified: Secondary | ICD-10-CM | POA: Insufficient documentation

## 2023-08-25 DIAGNOSIS — N898 Other specified noninflammatory disorders of vagina: Secondary | ICD-10-CM | POA: Insufficient documentation

## 2023-08-25 NOTE — Assessment & Plan Note (Signed)
 Not vesicular but a small papule. Not consistent with HSV or warts. Slightly tender. Will monitor and consider GYN referral if persistent. Pt to return in 1-2 weeks for f/u.

## 2023-08-25 NOTE — Assessment & Plan Note (Signed)
 Recurrent UTIs with dysuria and positive with leuks and nitrites today. Previous cefadroxil  use effective.  - Prescribe cefadroxil . - Refer to urology for evaluation of recurrent UTIs, may need low dose abx suppressive therapy

## 2023-08-25 NOTE — Assessment & Plan Note (Addendum)
 Trichomoniasis confirmed on wet prep and has strawberry cervix on exam.  No symptoms of PID currently. - Prescribe metronidazole  and diflucan  to prevent yeast infection - Advise informing sexual partners and abstaining from sexual activity for two weeks post-treatment. - Recommend condom use to prevent STIs. - Test for gonorrhea, chlamydia, HIV, and syphilis. - Considering discussing PrEP therapy at next visit - Recommend test for re infection in ~ 3 months

## 2023-08-27 LAB — RPR

## 2023-08-29 LAB — CYTOLOGY - PAP
Adequacy: ABSENT
Chlamydia: NEGATIVE
Comment: NEGATIVE
Comment: NEGATIVE
Comment: NORMAL
Diagnosis: NEGATIVE
High risk HPV: NEGATIVE
Neisseria Gonorrhea: NEGATIVE

## 2023-08-30 ENCOUNTER — Ambulatory Visit: Payer: Self-pay | Admitting: Student

## 2023-08-30 LAB — RPR

## 2023-08-30 LAB — HIV ANTIBODY (ROUTINE TESTING W REFLEX)

## 2023-09-03 ENCOUNTER — Ambulatory Visit: Admitting: Student

## 2023-09-03 NOTE — Progress Notes (Deleted)

## 2023-10-09 ENCOUNTER — Other Ambulatory Visit: Payer: Self-pay | Admitting: Family Medicine

## 2023-10-09 DIAGNOSIS — G8929 Other chronic pain: Secondary | ICD-10-CM

## 2023-10-17 ENCOUNTER — Other Ambulatory Visit: Payer: Self-pay

## 2023-10-17 ENCOUNTER — Emergency Department (HOSPITAL_COMMUNITY)
Admission: EM | Admit: 2023-10-17 | Discharge: 2023-10-17 | Disposition: A | Discharge status: Home / Self Care | Attending: Emergency Medicine | Admitting: Emergency Medicine

## 2023-10-17 ENCOUNTER — Encounter (HOSPITAL_COMMUNITY): Payer: Self-pay | Admitting: Emergency Medicine

## 2023-10-17 DIAGNOSIS — Z79899 Other long term (current) drug therapy: Secondary | ICD-10-CM | POA: Insufficient documentation

## 2023-10-17 DIAGNOSIS — M545 Low back pain, unspecified: Secondary | ICD-10-CM | POA: Diagnosis present

## 2023-10-17 DIAGNOSIS — I10 Essential (primary) hypertension: Secondary | ICD-10-CM | POA: Diagnosis not present

## 2023-10-17 LAB — URINALYSIS, ROUTINE W REFLEX MICROSCOPIC
Bilirubin Urine: NEGATIVE
Glucose, UA: NEGATIVE mg/dL
Ketones, ur: NEGATIVE mg/dL
Nitrite: POSITIVE — AB
Protein, ur: NEGATIVE mg/dL
Specific Gravity, Urine: 1.006 (ref 1.005–1.030)
pH: 6 (ref 5.0–8.0)

## 2023-10-17 MED ORDER — KETOROLAC TROMETHAMINE 30 MG/ML IJ SOLN
30.0000 mg | Freq: Once | INTRAMUSCULAR | Status: AC
  Administered 2023-10-17: 30 mg via INTRAMUSCULAR
  Filled 2023-10-17: qty 1

## 2023-10-17 MED ORDER — METHOCARBAMOL 500 MG PO TABS: 500.0000 mg | ORAL_TABLET | Freq: Two times a day (BID) | ORAL | 0 refills | Status: DC | PRN

## 2023-10-17 MED ORDER — METHOCARBAMOL 500 MG PO TABS
500.0000 mg | ORAL_TABLET | Freq: Once | ORAL | Status: AC
  Administered 2023-10-17: 500 mg via ORAL
  Filled 2023-10-17: qty 1

## 2023-10-17 MED ORDER — LIDOCAINE 5 % EX PTCH
1.0000 | MEDICATED_PATCH | CUTANEOUS | Status: DC
  Administered 2023-10-17: 1 via TRANSDERMAL
  Filled 2023-10-17: qty 1

## 2023-10-17 NOTE — ED Provider Triage Note (Signed)
 Emergency Medicine Provider Triage Evaluation Note  Kristin [name] , a 48 y.o. female  was evaluated in triage.  Pt complains of lower back pain.  History of slipped disc in the lumbar spine has had pain for several years in this area.  Pain was worse a few hours ago.  Denies any new trauma.  Denies urinary or bowel changes.  Denies weakness or numbness in her lower extremities.  States she is having difficulty walking due to the pain.  Review of Systems  Positive: See above Negative: See above  Physical Exam  BP 119/88 (BP Location: Right Arm)   Pulse 74   Temp 98.1 F (36.7 C) (Oral)   Resp 16   Ht 5' 4.5 (1.638 m)   Wt 75.3 kg   SpO2 98%   BMI 28.05 kg/m  Gen:   Awake, no distress   Resp:  Normal effort  MSK:   Moves extremities without difficulty  Other:    Medical Decision Making  Medically screening exam initiated at 1:36 AM.  Appropriate orders placed.  Kristin [name] was informed that the remainder of the evaluation will be completed by another provider, this initial triage assessment does not replace that evaluation, and the importance of remaining in the ED until their evaluation is complete.  Work up started   Lang Norleen POUR, PA-C 10/17/23 [phone]

## 2023-10-17 NOTE — ED Provider Notes (Signed)
 [place] EMERGENCY DEPARTMENT AT [facility] Provider Note   CSN: [id number] Arrival date & time: 10/17/23  9941     Patient presents with: Back Pain   Kristin [name] is a 48 y.o. female.   The history is provided by the patient.  Patient with long history of back pain presents with increasing low back pain over the past week.  Denies any recent trauma.  Reports having a slipped disc for several years No fevers or vomiting.  No new fecal or urinary incontinence.  No previous back surgery.  She has tried physical therapy previously without improvement She reports it hurts to move her legs She denies any urinary symptoms    Past Medical History:  Diagnosis Date   Acid reflux    ACL tear 2011   not sure which side   Acute lateral meniscus tear of right knee    Anxiety    Arthritis    Back pain    slipped disc lower back lumbar   Bipolar 1 disorder (HCC)    Depression    Dysfunctional uterine bleeding    Eczema    Hypertension    Insomnia    Migraine headache    Neuromuscular disorder (HCC)    leg cramps   Osteoarthritis of right knee 12/21/2014   ACL deficit since 2011   Pyelonephritis 10/19/2022   Respiratory distress 01/12/2022   Sepsis with acute hypoxic respiratory failure without septic shock, due to unspecified organism (HCC) 01/06/2022    Prior to Admission medications   Medication Sig Start Date End Date Taking? Authorizing Provider  methocarbamol  (ROBAXIN ) 500 MG tablet Take 1 tablet (500 mg total) by mouth 2 (two) times daily as needed for muscle spasms. 10/17/23  Yes Midge Golas, MD  Acetaminophen  (TYLENOL  PO) Take 2 tablets by mouth 2 (two) times daily as needed (pain).    [provider]  gabapentin  (NEURONTIN ) 300 MG capsule TAKE 1 CAPSULE BY MOUTH EVERY MORNING AND TAKE 2 CAPSULES BY MOUTH AT BEDTIME 10/10/23   Rosendo Rush, MD  ibuprofen  (ADVIL ) 800 MG tablet Take 1 tablet (800 mg total) by mouth every 8 (eight) hours as  needed. 06/15/23   Sowell, Brandon, MD  lisinopril -hydrochlorothiazide  (ZESTORETIC ) 10-12.5 MG tablet Take 1 tablet by mouth daily. Patient not taking: Reported on 07/31/2023 07/16/23   Donzetta Rollene BRAVO, MD  fluticasone  (FLONASE ) 50 MCG/ACT nasal spray Place 2 sprays into both nostrils daily. Patient taking differently: Place 2 sprays into both nostrils daily as needed for allergies.  02/25/17 04/14/19  Babara Greig GAILS, PA-C  promethazine  (PHENERGAN ) 25 MG tablet Take 1 tablet (25 mg total) by mouth every 6 (six) hours as needed for nausea or vomiting. 04/17/18 08/29/18  Dean Clarity, MD    Allergies: Imitrex [sumatriptan] and Banana    Review of Systems  Constitutional:  Negative for fever.  Gastrointestinal:  Negative for abdominal pain.  Genitourinary:  Negative for dysuria.  Musculoskeletal:  Positive for back pain.    Updated Vital Signs BP 115/71   Pulse 79   Temp 97.6 F (36.4 C) (Oral)   Resp 17   Ht 1.638 m (5' 4.5)   Wt 75.3 kg   SpO2 100%   BMI 28.05 kg/m   Physical Exam CONSTITUTIONAL: Well developed/well nourished, crying HEAD: Normocephalic/atraumatic EYES: EOMI/PERRL ENMT: Mucous membranes moist NECK: supple no meningeal signs SPINE/BACK diffuse lumbar spinal and paraspinal tenderness No thoracic tenderness No bruising/crepitance/stepoffs noted to spine CV: S1/S2 noted, no murmurs/rubs/gallops noted LUNGS:  Lungs are clear to auscultation bilaterally, no apparent distress ABDOMEN: soft, nontender, no rebound or guarding GU:no cva tenderness NEURO: Awake/alert,  equal motor strength noted with the following: hip flexion/knee flexion/extension, foot dorsi/plantar flexion, great toe extension intact bilaterally,  Pt is able to stand unassisted. Movement of her legs is limited due to pain but symmetric EXTREMITIES: pulses normal, full ROM, no deformities SKIN: warm, color normal PSYCH: Anxious and tearful  (all labs ordered are listed, but only abnormal results are  displayed) Labs Reviewed  URINALYSIS, ROUTINE W REFLEX MICROSCOPIC - Abnormal; Notable for the following components:      Result Value   APPearance HAZY (*)    Hgb urine dipstick SMALL (*)    Nitrite POSITIVE (*)    Leukocytes,Ua LARGE (*)    Bacteria, UA MANY (*)    All other components within normal limits  URINE CULTURE    EKG: None  Radiology: No results found.   Procedures   Medications Ordered in the ED  lidocaine  (LIDODERM ) 5 % 1 patch (1 patch Transdermal Patch Applied 10/17/23 0402)  ketorolac  (TORADOL ) 30 MG/ML injection 30 mg (30 mg Intramuscular Given 10/17/23 0403)  methocarbamol  (ROBAXIN ) tablet 500 mg (500 mg Oral Given 10/17/23 0403)    Clinical Course as of 10/17/23 0511  Wed Oct 17, 2023  0509 Patient presented for acute on chronic back pain.  No recent trauma or falls.  No previous surgery.  She had no leg weakness, but did have pain with any movement.  She was able to stand independently.  There is not appear to be any focal neurodeficits in her legs.  She had no incontinence to suggest cauda equina [DW]  0510 On reassessment patient feels improved.  She is now resting comfortably.  We discussed her lab findings which include urinalysis which indicates a UTI though she has had this previously.  Per records, she has a history of recurrent UTIs and is post to have urology follow-up.  Patient denies any fevers, denies any dysuria or urinary frequency.  Will plan to add on urine culture and defer antibiotics for now. [DW]  0510 Will also refer to urology [DW]    Clinical Course User Index [DW] Midge Golas, MD                                 Medical Decision Making Amount and/or Complexity of Data Reviewed Labs: ordered.  Risk Prescription drug management.   This patient presents to the ED for concern of back pain, this involves an extensive number of treatment options, and is a complaint that carries with it a high risk of complications and morbidity.   The differential diagnosis includes but is not limited to pyelonephritis, muscle strain, discitis, epidural abscess, lumbosacral radiculopathy ruptured ectopic pregnancy, ovarian torsion  Comorbidities that complicate the patient evaluation: Patient's presentation is complicated by their history of hypertension  Additional history obtained: Records reviewed Primary Care Documents  Lab Tests: I Ordered, and personally interpreted labs.  The pertinent results include: Urinary tract infection  Medicines ordered and prescription drug management: I ordered medication including Toradol  for pain Reevaluation of the patient after these medicines showed that the patient    improved  Test Considered: Patient with history of chronic back pain, no new neurologic deficits to suggest need for emergent MRI  Reevaluation: After the interventions noted above, I reevaluated the patient and found that they have :improved  Complexity  of problems addressed: Patient's presentation is most consistent with  exacerbation of chronic illness  Disposition: After consideration of the diagnostic results and the patient's response to treatment,  I feel that the patent would benefit from discharge  .        Final diagnoses:  Acute midline low back pain without sciatica    ED Discharge Orders          Ordered    methocarbamol  (ROBAXIN ) 500 MG tablet  2 times daily PRN        10/17/23 0507               Midge Golas, MD 10/17/23 [phone]

## 2023-10-17 NOTE — ED Triage Notes (Signed)
 Patient arrives via [facility] from home for lower back pain. Patient reports history of slipped disc in lumbar region several years ago and now reports flare up in pain. Denies new injury. Reports pain radiates to bilateral legs and endorses tingling in bilateral feet.   650mg  tylenol  given PTA.   BP 136/90, P 78, SPO2 98%

## 2023-10-17 NOTE — Discharge Instructions (Signed)
 Please call the urology doctor that is listed for follow-up for your recurrent urinary infections We have sent your urine to the lab to see if you need to be on a specific antibiotic, you will be called if you need a new medication You can use a heating pad for your back pain and you can use the muscle relaxer sparingly when trying to rest at home.  Do not drive while taking this medicine.  Please follow with your primary doctor in 2 weeks for recheck of your back pain  SEEK IMMEDIATE MEDICAL ATTENTION IF: New numbness, tingling, weakness, or problem with the use of your arms or legs.  Severe back pain not relieved with medications.  Change in bowel or bladder control (if you lose control of stool or urine, or if you are unable to urinate) Increasing pain in any areas of the body (such as chest or abdominal pain).  Shortness of breath, dizziness or fainting.  Nausea (feeling sick to your stomach), vomiting, fever, or sweats.

## 2023-10-19 LAB — URINE CULTURE: Culture: >=100000 — AB

## 2023-10-20 ENCOUNTER — Telehealth (HOSPITAL_BASED_OUTPATIENT_CLINIC_OR_DEPARTMENT_OTHER): Payer: Self-pay | Admitting: *Deleted

## 2023-10-20 NOTE — Telephone Encounter (Signed)
 Post ED Visit - Positive Culture Follow-up  Culture report reviewed by antimicrobial stewardship pharmacist: [facility] []  Rankin Dee, Pharm.D. []  Venetia Gully, Pharm.D., BCPS AQ-ID []  Garrel Crews, Pharm.D., BCPS []  Almarie Lunger, Pharm.D., BCPS []  [employer], [address].D., BCPS, AAHIVP []  Rosaline Bihari, Pharm.D., BCPS, AAHIVP []  Vernell Meier, PharmD, BCPS []  Latanya Hint, PharmD, BCPS []  Donald Medley, PharmD, BCPS []  Rocky Bold, PharmD []  Dorothyann Alert, PharmD, BCPS [x]  Dorn Poot, PharmD  [facility] []  Rosaline Edison, PharmD []  Romona Bliss, PharmD []  Dolphus Roller, PharmD []  Veva Seip, Rph []  Vernell Daunt) Leonce, PharmD []  Eva Allis, PharmD []  Rosaline Millet, PharmD []  Iantha Batch, PharmD []  Arvin Gauss, PharmD []  Wanda Hasting, PharmD []  Ronal Rav, PharmD []  Rocky Slade, PharmD []  Bard Jeans, PharmD   Positive urine culture No s/s of UTI. Do no treat per Fonda Ruby, PA  Kristin [name] 10/20/2023, 10:01 AM

## 2023-11-05 ENCOUNTER — Telehealth: Payer: Self-pay

## 2023-11-05 ENCOUNTER — Inpatient Hospital Stay

## 2023-11-05 ENCOUNTER — Inpatient Hospital Stay: Admitting: Oncology

## 2023-11-05 NOTE — Telephone Encounter (Signed)
 Left a voicemail with patient's mother, Mirta, to let her son know that a scheduler from Dr. Clovis office would be reaching out to make new appt as pt did not call or show up for his appt today.

## 2023-12-01 ENCOUNTER — Other Ambulatory Visit: Payer: Self-pay

## 2023-12-01 ENCOUNTER — Emergency Department (HOSPITAL_COMMUNITY)

## 2023-12-01 ENCOUNTER — Encounter (HOSPITAL_COMMUNITY): Payer: Self-pay

## 2023-12-01 ENCOUNTER — Emergency Department (HOSPITAL_COMMUNITY)
Admission: EM | Admit: 2023-12-01 | Discharge: 2023-12-01 | Disposition: A | Discharge status: Home / Self Care | Attending: Emergency Medicine | Admitting: Emergency Medicine

## 2023-12-01 DIAGNOSIS — Y93F9 Activity, other caregiving: Secondary | ICD-10-CM | POA: Diagnosis not present

## 2023-12-01 DIAGNOSIS — M25572 Pain in left ankle and joints of left foot: Secondary | ICD-10-CM | POA: Diagnosis present

## 2023-12-01 DIAGNOSIS — S8262XA Displaced fracture of lateral malleolus of left fibula, initial encounter for closed fracture: Secondary | ICD-10-CM | POA: Insufficient documentation

## 2023-12-01 DIAGNOSIS — Y990 Civilian activity done for income or pay: Secondary | ICD-10-CM | POA: Insufficient documentation

## 2023-12-01 DIAGNOSIS — W19XXXA Unspecified fall, initial encounter: Secondary | ICD-10-CM | POA: Diagnosis not present

## 2023-12-01 MED ORDER — OXYCODONE-ACETAMINOPHEN 5-325 MG PO TABS
2.0000 | ORAL_TABLET | Freq: Once | ORAL | Status: AC
  Administered 2023-12-01: 2 via ORAL
  Filled 2023-12-01: qty 2

## 2023-12-01 MED ORDER — OXYCODONE-ACETAMINOPHEN 5-325 MG PO TABS: 1.0000 | ORAL_TABLET | ORAL | 0 refills | Status: DC | PRN

## 2023-12-01 NOTE — ED Triage Notes (Signed)
 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 Pt reports it twisted & now her Lt foot is swollen, does have an OTC ankle brace on & rates pain 10/10 during triage.

## 2023-12-01 NOTE — Discharge Instructions (Signed)
 Elevate ankle,  Ice to area of swelling.  Schedule to see the Orthopaedist for evaluation

## 2023-12-01 NOTE — ED Provider Notes (Signed)
 [place] EMERGENCY DEPARTMENT AT [facility] Provider Note   CSN: [id number] Arrival date & time: 12/01/23  1144     Patient presents with: Ankle Pain   Kristin [name] is a 48 y.o. female.   Patient reports she injured her left ankle last p.m.  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  Patient reports increased swelling and pain today.  She states she has been having difficulty walking due to the swelling.  Patient denies any other area of injury.  No impact of head.  The history is provided by the patient. No language interpreter was used.  Ankle Pain Pain details:    Progression:  Worsening Chronicity:  New Prior injury to area:  No      Prior to Admission medications   Medication Sig Start Date End Date Taking? Authorizing Provider  oxyCODONE -acetaminophen  (PERCOCET) 5-325 MG tablet Take 1 tablet by mouth every 4 (four) hours as needed for severe pain (pain score 7-10). 12/01/23 11/30/24 Yes Jameshia Hayashida K, PA-C  Acetaminophen  (TYLENOL  PO) Take 2 tablets by mouth 2 (two) times daily as needed (pain).    [provider]  gabapentin  (NEURONTIN ) 300 MG capsule TAKE 1 CAPSULE BY MOUTH EVERY MORNING AND TAKE 2 CAPSULES BY MOUTH AT BEDTIME 10/10/23   Rosendo Rush, MD  ibuprofen  (ADVIL ) 800 MG tablet Take 1 tablet (800 mg total) by mouth every 8 (eight) hours as needed. 06/15/23   Sowell, Brandon, MD  lisinopril -hydrochlorothiazide  (ZESTORETIC ) 10-12.5 MG tablet Take 1 tablet by mouth daily. Patient not taking: Reported on 07/31/2023 07/16/23   Donzetta Rollene BRAVO, MD  methocarbamol  (ROBAXIN ) 500 MG tablet Take 1 tablet (500 mg total) by mouth 2 (two) times daily as needed for muscle spasms. 10/17/23   Midge Golas, MD  fluticasone  (FLONASE ) 50 MCG/ACT nasal spray Place 2 sprays into both nostrils daily. Patient taking differently: Place 2 sprays into both nostrils daily as needed for allergies.  02/25/17 04/14/19  Babara, Amy V, PA-C  promethazine  (PHENERGAN )  25 MG tablet Take 1 tablet (25 mg total) by mouth every 6 (six) hours as needed for nausea or vomiting. 04/17/18 08/29/18  Haviland, Julie, MD    Allergies: Imitrex [sumatriptan] and Banana    Review of Systems  All other systems reviewed and are negative.   Updated Vital Signs BP 102/71 (BP Location: Right Arm)   Pulse 92   Temp 97.8 F (36.6 C)   Resp 15   Ht 5' 4.5 (1.638 m)   Wt 86.2 kg   SpO2 96%   BMI 32.11 kg/m   Physical Exam Vitals reviewed.  Constitutional:      Appearance: Normal appearance.  Cardiovascular:     Rate and Rhythm: Normal rate.  Pulmonary:     Effort: Pulmonary effort is normal.  Musculoskeletal:        General: Swelling and tenderness present.     Comments: Swollen tender left ankle, decreased range of motion, neurovascular and  neurosensory intact.  Skin:    General: Skin is warm.  Neurological:     General: No focal deficit present.     Mental Status: She is alert.     (all labs ordered are listed, but only abnormal results are displayed) Labs Reviewed - No data to display  EKG: None  Radiology: DG Foot Complete Left Result Date: 12/01/2023 CLINICAL DATA:  Fall yesterday with left foot pain. EXAM: LEFT FOOT - COMPLETE 3+ VIEW COMPARISON:  None Available. FINDINGS: Evidence of  patient's known distal fibular fracture. Small fragment adjacent the tip of the medial malleolus which may be acute or chronic. No acute fracture or dislocation involving the foot. Minimal degenerative change over the midfoot. IMPRESSION: 1. No acute fracture or dislocation involving the foot. 2. Distal fibular fracture. Small fragment adjacent the tip of the medial malleolus which may be acute or chronic. Electronically Signed   By: Toribio Agreste M.D.   On: 12/01/2023 13:32   DG Ankle Complete Left Result Date: 12/01/2023 CLINICAL DATA:  Fall with left ankle pain. EXAM: LEFT ANKLE COMPLETE - 3+ VIEW COMPARISON:  None Available. FINDINGS: Exam demonstrates a minimally  displaced oblique fracture of the distal fibular diametaphyseal region just above the ankle mortise. Ankle mortise is otherwise unremarkable. There is moderate soft tissue swelling over the ankle. Small fragment distal to the medial malleolus likely chronic and much less likely an acute rib fracture. IMPRESSION: 1. Minimally displaced oblique fracture of the distal fibular diametaphyseal region just above the ankle mortise. 2. Small fragment distal to the tip of the medial malleolus likely chronic and less likely acute chip fracture. Electronically Signed   By: Toribio Agreste M.D.   On: 12/01/2023 13:31     Procedures   Medications Ordered in the ED  oxyCODONE -acetaminophen  (PERCOCET/ROXICET) 5-325 MG per tablet 2 tablet (2 tablets Oral Given 12/01/23 1316)                                    Medical Decision Making Patient fell yesterday injuring her left ankle at work  Amount and/or Complexity of Data Reviewed Radiology: ordered and independent interpretation performed. Decision-making details documented in ED Course.    Details: X-ray left ankle shows left distal fibula fracture  Risk Prescription drug management. Risk Details: Patient counseled on results of x-ray, patient placed in a posterior splint and given crutches.  Patient is advised to call the orthopedist on-call on Monday to schedule appointment to be seen for evaluation.  Patient is given a prescription for oxycodone .  She is discharged in stable condition.        Final diagnoses:  Closed displaced fracture of lateral malleolus of left fibula, initial encounter    ED Discharge Orders          Ordered    oxyCODONE -acetaminophen  (PERCOCET) 5-325 MG tablet  Every 4 hours PRN        12/01/23 1351            An After Visit Summary was printed and given to the patient.    Flint Sonny POUR, [place] 12/01/23 1453    Dean Clarity, MD 12/01/23 1556    Dean Clarity, MD 12/01/23 [phone]

## 2023-12-01 NOTE — ED Notes (Signed)
 Pt given aircast due to not wanting CAM boot. Pt wheeled out. Discharge paperwork given by another RN.

## 2023-12-01 NOTE — ED Triage Notes (Signed)
 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

## 2023-12-01 NOTE — ED Notes (Signed)
 Pt expressed anger toward the ortho tech after the splint was placed. This RN overheard her tell him to get someone else and that she was unhappy with the splint. She started pulling off the splinting material. EDP and charge RN notified.

## 2023-12-01 NOTE — Progress Notes (Signed)
 Orthopedic Tech Progress Note Patient Details:  Kristin [name] 1975/07/07 [id number]  Ortho Devices Type of Ortho Device: Short leg splint, Crutches Ortho Device/Splint Location: LLE Ortho Device/Splint Interventions: Ordered, Application, Adjustment  I was called to apply short leg splint as well as give crutches. Patient asked questions about splints vs casts/follow ups and I answered thoroughly. I applied the posterior slab followed by the stirrup in order to complete the short leg splint. Patient seemed initially seemed happy with the splint, but as soon as she wanted to stand up, she stated that the splint was heavy and that there was no way she was going to walk in the splint.   Patient expressed anger, such as are you trying to make a fool out of me, despite splint being correctly applied. I explained to the patient that splint is inherently going to be a little bit heavy in order to properly and comfortably immobilize the fracture. Patient stated she was going to rip off the splint, and despite my best efforts to explain that the splint was going to be bulky but that it would help the bones stay immobilized, patient ripped off splint.   Patient was still very unhappy, and when I attempted to do crutch training, patient refused any more communication with me and asked for supervisor. Talked with RN after and notified charge nurse.   Post Interventions Patient Tolerated: Other (comment), Fair Instructions Provided: Other (comment)  [name] 12/01/2023, 4:33 PM

## 2023-12-11 ENCOUNTER — Other Ambulatory Visit: Payer: Self-pay | Admitting: Orthopedic Surgery

## 2023-12-11 DIAGNOSIS — S82842A Displaced bimalleolar fracture of left lower leg, initial encounter for closed fracture: Secondary | ICD-10-CM

## 2023-12-13 ENCOUNTER — Encounter (HOSPITAL_COMMUNITY): Payer: Self-pay | Admitting: Orthopedic Surgery

## 2023-12-13 NOTE — Progress Notes (Signed)
 For Anesthesia: PCP - Norleen April, MD  Cardiologist -   Bowel Prep reminder:  Chest x-ray - 04/18/23 in [facility] EKG - 04/17/23 in [facility] Stress Test -  ECHO -  Cardiac Cath - 10/23 in [facility] Pacemaker/ICD device last checked: Pacemaker orders received: Device Rep notified:  Spinal Cord Stimulator:  Sleep Study -  CPAP -   Fasting Blood Sugar -  Checks Blood Sugar _____ times a day Date and result of last Hgb A1c-  Last dose of GLP1 agonist-  GLP1 instructions: Hold 7 days prior to schedule (Hold 24 hours-daily)   Last dose of SGLT-2 inhibitors-  SGLT-2 instructions: Hold 72 hours prior to surgery  Blood Thinner Instructions: Last Dose: Time last taken:  Aspirin  Instructions: Last Dose: Time last taken:  Activity level: Can go up a flight of stairs and activities of daily living without stopping and without chest pain and/or shortness of breath   Able to exercise without chest pain and/or shortness of breath   Unable to go up a flight of stairs without chest pain and/or shortness of breath     Anesthesia review: HTN  Patient denies shortness of breath, fever, cough and chest pain at PAT appointment   Patient verbalized understanding of instructions that were reviewed over the telephone.

## 2023-12-13 NOTE — Progress Notes (Signed)
 Sent message, via epic in basket, requesting orders in epic from Careers adviser.

## 2023-12-13 NOTE — Progress Notes (Signed)
 Attempted to obtain medical history via telephone, unable to reach at this time. HIPAA compliant voicemail message left requesting return call to pre surgical testing department.

## 2023-12-14 ENCOUNTER — Encounter (HOSPITAL_COMMUNITY): Payer: Self-pay | Admitting: Orthopedic Surgery

## 2023-12-14 ENCOUNTER — Other Ambulatory Visit: Payer: Self-pay

## 2023-12-14 ENCOUNTER — Ambulatory Visit
Admission: RE | Admit: 2023-12-14 | Discharge: 2023-12-14 | Disposition: A | Discharge status: Home / Self Care | Source: Ambulatory Visit | Attending: Orthopedic Surgery | Admitting: Orthopedic Surgery

## 2023-12-14 DIAGNOSIS — S82842A Displaced bimalleolar fracture of left lower leg, initial encounter for closed fracture: Secondary | ICD-10-CM

## 2023-12-17 ENCOUNTER — Encounter (HOSPITAL_COMMUNITY): Payer: Self-pay | Admitting: Orthopedic Surgery

## 2023-12-18 ENCOUNTER — Ambulatory Visit (HOSPITAL_COMMUNITY): Payer: Self-pay | Admitting: Anesthesiology

## 2023-12-18 ENCOUNTER — Encounter (HOSPITAL_COMMUNITY): Payer: Self-pay | Admitting: Orthopedic Surgery

## 2023-12-18 ENCOUNTER — Other Ambulatory Visit: Payer: Self-pay

## 2023-12-18 ENCOUNTER — Observation Stay (HOSPITAL_COMMUNITY)
Admission: AD | Admit: 2023-12-18 | Discharge: 2023-12-21 | Disposition: A | Discharge status: Home / Self Care | Attending: Orthopedic Surgery | Admitting: Orthopedic Surgery

## 2023-12-18 ENCOUNTER — Encounter (HOSPITAL_COMMUNITY)
Admission: AD | Disposition: A | Discharge status: Home / Self Care | Payer: Self-pay | Source: Home / Self Care | Attending: Orthopedic Surgery

## 2023-12-18 DIAGNOSIS — S82842A Displaced bimalleolar fracture of left lower leg, initial encounter for closed fracture: Principal | ICD-10-CM | POA: Insufficient documentation

## 2023-12-18 DIAGNOSIS — X58XXXA Exposure to other specified factors, initial encounter: Secondary | ICD-10-CM | POA: Diagnosis not present

## 2023-12-18 DIAGNOSIS — I110 Hypertensive heart disease with heart failure: Secondary | ICD-10-CM | POA: Diagnosis not present

## 2023-12-18 DIAGNOSIS — S93422A Sprain of deltoid ligament of left ankle, initial encounter: Secondary | ICD-10-CM | POA: Diagnosis not present

## 2023-12-18 DIAGNOSIS — I10 Essential (primary) hypertension: Principal | ICD-10-CM

## 2023-12-18 DIAGNOSIS — M25572 Pain in left ankle and joints of left foot: Secondary | ICD-10-CM | POA: Diagnosis present

## 2023-12-18 DIAGNOSIS — S93432A Sprain of tibiofibular ligament of left ankle, initial encounter: Secondary | ICD-10-CM | POA: Insufficient documentation

## 2023-12-18 DIAGNOSIS — F17210 Nicotine dependence, cigarettes, uncomplicated: Secondary | ICD-10-CM | POA: Diagnosis not present

## 2023-12-18 DIAGNOSIS — D649 Anemia, unspecified: Secondary | ICD-10-CM

## 2023-12-18 DIAGNOSIS — I5021 Acute systolic (congestive) heart failure: Secondary | ICD-10-CM | POA: Insufficient documentation

## 2023-12-18 HISTORY — DX: Personal history of other medical treatment: Z92.89

## 2023-12-18 HISTORY — PX: LIGAMENT REPAIR: SHX5444

## 2023-12-18 HISTORY — DX: Urinary tract infection, site not specified: N39.0

## 2023-12-18 HISTORY — PX: ORIF ANKLE FRACTURE: SHX5408

## 2023-12-18 HISTORY — DX: Anemia, unspecified: D64.9

## 2023-12-18 HISTORY — PX: SYNDESMOSIS REPAIR: SHX5182

## 2023-12-18 LAB — BASIC METABOLIC PANEL WITH GFR
Anion gap: 10 (ref 5–15)
BUN: 16 mg/dL (ref 6–20)
CO2: 24 mmol/L (ref 22–32)
Calcium: 9.2 mg/dL (ref 8.9–10.3)
Chloride: 105 mmol/L (ref 98–111)
Creatinine, Ser: 0.89 mg/dL (ref 0.44–1.00)
GFR, Estimated: >60 mL/min (ref >60)
Glucose, Bld: 93 mg/dL (ref 70–99)
Potassium: 4.1 mmol/L (ref 3.5–5.1)
Sodium: 139 mmol/L (ref 135–145)

## 2023-12-18 LAB — CBC
HCT: 32.5 % — ABNORMAL LOW (ref 36.0–46.0)
Hemoglobin: 10.2 g/dL — ABNORMAL LOW (ref 12.0–15.0)
MCH: 31.2 pg (ref 26.0–34.0)
MCHC: 31.4 g/dL (ref 30.0–36.0)
MCV: 99.4 fL (ref 80.0–100.0)
Platelets: 214 K/uL (ref 150–400)
RBC: 3.27 MIL/uL — ABNORMAL LOW (ref 3.87–5.11)
RDW: 14.8 % (ref 11.5–15.5)
WBC: 5.5 K/uL (ref 4.0–10.5)
nRBC: 0 % (ref 0.0–0.2)

## 2023-12-18 LAB — POCT PREGNANCY, URINE: Preg Test, Ur: NEGATIVE

## 2023-12-18 SURGERY — OPEN REDUCTION INTERNAL FIXATION (ORIF) ANKLE FRACTURE
Anesthesia: General | Site: Ankle | Laterality: Left

## 2023-12-18 MED ORDER — HYDROMORPHONE HCL 2 MG PO TABS
ORAL_TABLET | ORAL | Status: AC
  Filled 2023-12-18: qty 1

## 2023-12-18 MED ORDER — HYDROMORPHONE HCL 1 MG/ML IJ SOLN: 0.5000 mg | INTRAMUSCULAR | Status: DC | PRN

## 2023-12-18 MED ORDER — VANCOMYCIN HCL 1000 MG IV SOLR
INTRAVENOUS | Status: DC | PRN
  Administered 2023-12-18: 1000 mg

## 2023-12-18 MED ORDER — CEFAZOLIN SODIUM-DEXTROSE 2-4 GM/100ML-% IV SOLN
2.0000 g | INTRAVENOUS | Status: AC
  Administered 2023-12-18: 2 g via INTRAVENOUS
  Filled 2023-12-18: qty 100

## 2023-12-18 MED ORDER — ORAL CARE MOUTH RINSE: 15.0000 mL | Freq: Once | OROMUCOSAL | Status: AC

## 2023-12-18 MED ORDER — DIPHENHYDRAMINE HCL 12.5 MG/5ML PO ELIX: 12.5000 mg | ORAL_SOLUTION | ORAL | Status: DC | PRN

## 2023-12-18 MED ORDER — PROPOFOL 10 MG/ML IV BOLUS
INTRAVENOUS | Status: AC
  Filled 2023-12-18: qty 20

## 2023-12-18 MED ORDER — ONDANSETRON HCL 4 MG/2ML IJ SOLN: 4.0000 mg | Freq: Four times a day (QID) | INTRAMUSCULAR | Status: DC | PRN

## 2023-12-18 MED ORDER — DOCUSATE SODIUM 100 MG PO CAPS
100.0000 mg | ORAL_CAPSULE | Freq: Two times a day (BID) | ORAL | Status: DC
  Administered 2023-12-19 – 2023-12-21 (×6): 100 mg via ORAL
  Filled 2023-12-18 (×6): qty 1

## 2023-12-18 MED ORDER — PANTOPRAZOLE SODIUM 40 MG PO TBEC
40.0000 mg | DELAYED_RELEASE_TABLET | Freq: Every day | ORAL | Status: DC
  Administered 2023-12-18 – 2023-12-21 (×4): 40 mg via ORAL
  Filled 2023-12-18 (×4): qty 1

## 2023-12-18 MED ORDER — ONDANSETRON HCL 4 MG/2ML IJ SOLN: 4.0000 mg | Freq: Once | INTRAMUSCULAR | Status: DC | PRN

## 2023-12-18 MED ORDER — HYDROCHLOROTHIAZIDE 12.5 MG PO TABS
12.5000 mg | ORAL_TABLET | Freq: Every day | ORAL | Status: DC
  Administered 2023-12-19 – 2023-12-21 (×3): 12.5 mg via ORAL
  Filled 2023-12-18 (×3): qty 1

## 2023-12-18 MED ORDER — ACETAMINOPHEN 10 MG/ML IV SOLN
INTRAVENOUS | Status: AC
  Filled 2023-12-18: qty 100

## 2023-12-18 MED ORDER — SUGAMMADEX SODIUM 200 MG/2ML IV SOLN
INTRAVENOUS | Status: DC | PRN
  Administered 2023-12-18: 200 mg via INTRAVENOUS

## 2023-12-18 MED ORDER — LACTATED RINGERS IV SOLN: INTRAVENOUS | Status: DC

## 2023-12-18 MED ORDER — LISINOPRIL-HYDROCHLOROTHIAZIDE 10-12.5 MG PO TABS: 1.0000 | ORAL_TABLET | Freq: Every day | ORAL | Status: DC

## 2023-12-18 MED ORDER — DEXMEDETOMIDINE HCL IN NACL 200 MCG/50ML IV SOLN
INTRAVENOUS | Status: DC | PRN
  Administered 2023-12-18: 16 ug via INTRAVENOUS

## 2023-12-18 MED ORDER — SUGAMMADEX SODIUM 200 MG/2ML IV SOLN
INTRAVENOUS | Status: AC
  Filled 2023-12-18: qty 2

## 2023-12-18 MED ORDER — DIPHENHYDRAMINE HCL 50 MG/ML IJ SOLN
12.5000 mg | Freq: Once | INTRAMUSCULAR | Status: AC
  Administered 2023-12-18: 12.5 mg via INTRAVENOUS

## 2023-12-18 MED ORDER — GABAPENTIN 300 MG PO CAPS
300.0000 mg | ORAL_CAPSULE | Freq: Three times a day (TID) | ORAL | Status: DC | PRN
  Administered 2023-12-18 – 2023-12-19 (×2): 600 mg via ORAL
  Filled 2023-12-18 (×2): qty 2

## 2023-12-18 MED ORDER — BACITRACIN ZINC 500 UNIT/GM EX OINT
TOPICAL_OINTMENT | CUTANEOUS | Status: AC
  Filled 2023-12-18: qty 28.35

## 2023-12-18 MED ORDER — SUCCINYLCHOLINE CHLORIDE 200 MG/10ML IV SOSY
PREFILLED_SYRINGE | INTRAVENOUS | Status: DC | PRN
  Administered 2023-12-18: 100 mg via INTRAVENOUS

## 2023-12-18 MED ORDER — LIDOCAINE HCL (CARDIAC) PF 100 MG/5ML IV SOSY
PREFILLED_SYRINGE | INTRAVENOUS | Status: DC | PRN
  Administered 2023-12-18: 60 mg via INTRAVENOUS

## 2023-12-18 MED ORDER — HYDROMORPHONE HCL 2 MG PO TABS
2.0000 mg | ORAL_TABLET | ORAL | Status: DC | PRN
  Administered 2023-12-18: 2 mg via ORAL
  Administered 2023-12-19: 3 mg via ORAL
  Administered 2023-12-19: 2 mg via ORAL
  Administered 2023-12-19 – 2023-12-20 (×3): 3 mg via ORAL
  Administered 2023-12-21: 2 mg via ORAL
  Filled 2023-12-18 (×2): qty 2
  Filled 2023-12-18: qty 1
  Filled 2023-12-18 (×2): qty 2
  Filled 2023-12-18: qty 1

## 2023-12-18 MED ORDER — FENTANYL CITRATE (PF) 100 MCG/2ML IJ SOLN
INTRAMUSCULAR | Status: AC
  Filled 2023-12-18: qty 2

## 2023-12-18 MED ORDER — ONDANSETRON HCL 4 MG/2ML IJ SOLN
INTRAMUSCULAR | Status: AC
  Filled 2023-12-18: qty 2

## 2023-12-18 MED ORDER — PROPOFOL 10 MG/ML IV BOLUS
INTRAVENOUS | Status: DC | PRN
  Administered 2023-12-18: 50 mg via INTRAVENOUS
  Administered 2023-12-18: 150 mg via INTRAVENOUS
  Administered 2023-12-18: 50 mg via INTRAVENOUS

## 2023-12-18 MED ORDER — FENTANYL CITRATE PF 50 MCG/ML IJ SOSY
PREFILLED_SYRINGE | INTRAMUSCULAR | Status: AC
  Filled 2023-12-18: qty 1

## 2023-12-18 MED ORDER — BUPIVACAINE LIPOSOME 1.3 % IJ SUSP
INTRAMUSCULAR | Status: AC
  Filled 2023-12-18: qty 20

## 2023-12-18 MED ORDER — BUPIVACAINE LIPOSOME 1.3 % IJ SUSP
INTRAMUSCULAR | Status: AC
  Filled 2023-12-18: qty 10

## 2023-12-18 MED ORDER — FENTANYL CITRATE PF 50 MCG/ML IJ SOSY
25.0000 ug | PREFILLED_SYRINGE | INTRAMUSCULAR | Status: DC | PRN
  Administered 2023-12-18 (×3): 50 ug via INTRAVENOUS

## 2023-12-18 MED ORDER — 0.9 % SODIUM CHLORIDE (POUR BTL) OPTIME
TOPICAL | Status: DC | PRN
  Administered 2023-12-18: 1000 mL

## 2023-12-18 MED ORDER — DEXAMETHASONE SODIUM PHOSPHATE 10 MG/ML IJ SOLN
INTRAMUSCULAR | Status: AC
  Filled 2023-12-18: qty 1

## 2023-12-18 MED ORDER — SODIUM CHLORIDE 0.9 % IV SOLN: INTRAVENOUS | Status: AC

## 2023-12-18 MED ORDER — OXYCODONE HCL 5 MG PO TABS
5.0000 mg | ORAL_TABLET | ORAL | Status: DC | PRN
  Administered 2023-12-19 (×2): 5 mg via ORAL
  Administered 2023-12-20 – 2023-12-21 (×2): 10 mg via ORAL
  Filled 2023-12-18: qty 1
  Filled 2023-12-18: qty 2
  Filled 2023-12-18: qty 1
  Filled 2023-12-18: qty 2

## 2023-12-18 MED ORDER — SENNA 8.6 MG PO TABS
1.0000 | ORAL_TABLET | Freq: Two times a day (BID) | ORAL | Status: DC
  Administered 2023-12-19 – 2023-12-21 (×6): 8.6 mg via ORAL
  Filled 2023-12-18 (×6): qty 1

## 2023-12-18 MED ORDER — DEXAMETHASONE SODIUM PHOSPHATE 4 MG/ML IJ SOLN
INTRAMUSCULAR | Status: DC | PRN
  Administered 2023-12-18: 10 mg via INTRAVENOUS

## 2023-12-18 MED ORDER — SODIUM CHLORIDE 0.9 % IV SOLN
INTRAVENOUS | Status: DC
Start: 2023-12-18 — End: 2023-12-18

## 2023-12-18 MED ORDER — ACETAMINOPHEN 325 MG PO TABS
325.0000 mg | ORAL_TABLET | Freq: Four times a day (QID) | ORAL | Status: DC | PRN
  Administered 2023-12-21: 650 mg via ORAL
  Filled 2023-12-18: qty 2

## 2023-12-18 MED ORDER — ACETAMINOPHEN 10 MG/ML IV SOLN
1000.0000 mg | Freq: Once | INTRAVENOUS | Status: DC | PRN
  Administered 2023-12-18: 1000 mg via INTRAVENOUS

## 2023-12-18 MED ORDER — MIDAZOLAM HCL 5 MG/5ML IJ SOLN
INTRAMUSCULAR | Status: DC | PRN
  Administered 2023-12-18: 2 mg via INTRAVENOUS

## 2023-12-18 MED ORDER — FENTANYL CITRATE PF 50 MCG/ML IJ SOSY: 50.0000 ug | PREFILLED_SYRINGE | INTRAMUSCULAR | Status: DC

## 2023-12-18 MED ORDER — DIPHENHYDRAMINE HCL 50 MG/ML IJ SOLN
INTRAMUSCULAR | Status: AC
  Filled 2023-12-18: qty 1

## 2023-12-18 MED ORDER — STERILE WATER FOR IRRIGATION IR SOLN
Status: DC | PRN
Start: 2023-12-18 — End: 2023-12-18
  Administered 2023-12-18: 1000 mL

## 2023-12-18 MED ORDER — OXYCODONE HCL 5 MG PO TABS
ORAL_TABLET | ORAL | Status: AC
  Filled 2023-12-18: qty 1

## 2023-12-18 MED ORDER — MIDAZOLAM HCL 2 MG/2ML IJ SOLN
1.0000 mg | INTRAMUSCULAR | Status: DC
  Filled 2023-12-18: qty 2

## 2023-12-18 MED ORDER — BUPIVACAINE HCL (PF) 0.5 % IJ SOLN
INTRAMUSCULAR | Status: DC | PRN
  Administered 2023-12-18 (×2): 10 mL via PERINEURAL

## 2023-12-18 MED ORDER — ALBUTEROL SULFATE HFA 108 (90 BASE) MCG/ACT IN AERS
INHALATION_SPRAY | RESPIRATORY_TRACT | Status: DC | PRN
  Administered 2023-12-18: 2 via RESPIRATORY_TRACT
  Administered 2023-12-18: 5 via RESPIRATORY_TRACT

## 2023-12-18 MED ORDER — OXYCODONE HCL 5 MG PO TABS
5.0000 mg | ORAL_TABLET | Freq: Once | ORAL | Status: AC | PRN
  Administered 2023-12-18: 5 mg via ORAL

## 2023-12-18 MED ORDER — ONDANSETRON HCL 4 MG/2ML IJ SOLN
INTRAMUSCULAR | Status: DC | PRN
  Administered 2023-12-18: 4 mg via INTRAVENOUS

## 2023-12-18 MED ORDER — ALBUTEROL SULFATE HFA 108 (90 BASE) MCG/ACT IN AERS
INHALATION_SPRAY | RESPIRATORY_TRACT | Status: AC
  Filled 2023-12-18: qty 6.7

## 2023-12-18 MED ORDER — ONDANSETRON HCL 4 MG PO TABS: 4.0000 mg | ORAL_TABLET | Freq: Four times a day (QID) | ORAL | Status: DC | PRN

## 2023-12-18 MED ORDER — ROCURONIUM BROMIDE 100 MG/10ML IV SOLN
INTRAVENOUS | Status: DC | PRN
  Administered 2023-12-18: 50 mg via INTRAVENOUS

## 2023-12-18 MED ORDER — OXYCODONE HCL 5 MG/5ML PO SOLN: 5.0000 mg | Freq: Once | ORAL | Status: AC | PRN

## 2023-12-18 MED ORDER — VANCOMYCIN HCL 1000 MG IV SOLR
INTRAVENOUS | Status: AC
  Filled 2023-12-18: qty 20

## 2023-12-18 MED ORDER — FENTANYL CITRATE PF 50 MCG/ML IJ SOSY
PREFILLED_SYRINGE | INTRAMUSCULAR | Status: AC
  Filled 2023-12-18: qty 2

## 2023-12-18 MED ORDER — CHLORHEXIDINE GLUCONATE 0.12 % MT SOLN
15.0000 mL | Freq: Once | OROMUCOSAL | Status: AC
  Administered 2023-12-18: 15 mL via OROMUCOSAL

## 2023-12-18 MED ORDER — METHOCARBAMOL 500 MG PO TABS
500.0000 mg | ORAL_TABLET | Freq: Four times a day (QID) | ORAL | Status: DC | PRN
  Administered 2023-12-18 – 2023-12-21 (×8): 500 mg via ORAL
  Filled 2023-12-18 (×8): qty 1

## 2023-12-18 MED ORDER — LISINOPRIL 10 MG PO TABS
10.0000 mg | ORAL_TABLET | Freq: Every day | ORAL | Status: DC
  Administered 2023-12-19 – 2023-12-21 (×3): 10 mg via ORAL
  Filled 2023-12-18 (×3): qty 1

## 2023-12-18 MED ORDER — METHOCARBAMOL 1000 MG/10ML IJ SOLN: 500.0000 mg | Freq: Four times a day (QID) | INTRAMUSCULAR | Status: DC | PRN

## 2023-12-18 MED ORDER — ESMOLOL HCL 100 MG/10ML IV SOLN
INTRAVENOUS | Status: AC
  Filled 2023-12-18: qty 10

## 2023-12-18 MED ORDER — BUPIVACAINE LIPOSOME 1.3 % IJ SUSP
INTRAMUSCULAR | Status: DC | PRN
Start: 2023-12-18 — End: 2023-12-18
  Administered 2023-12-18 (×2): 10 mL via PERINEURAL

## 2023-12-18 MED ORDER — ENOXAPARIN SODIUM 40 MG/0.4ML IJ SOSY
40.0000 mg | PREFILLED_SYRINGE | INTRAMUSCULAR | Status: DC
  Administered 2023-12-19 – 2023-12-21 (×3): 40 mg via SUBCUTANEOUS
  Filled 2023-12-18 (×3): qty 0.4

## 2023-12-18 MED ORDER — FENTANYL CITRATE (PF) 100 MCG/2ML IJ SOLN
INTRAMUSCULAR | Status: DC | PRN
  Administered 2023-12-18 (×2): 50 ug via INTRAVENOUS
  Administered 2023-12-18 (×2): 100 ug via INTRAVENOUS

## 2023-12-18 SURGICAL SUPPLY — 57 items
ANCH SUT JUGGERKNOT 2X1.45 BL (Anchor) IMPLANT
BAG COUNTER SPONGE SURGICOUNT (BAG) IMPLANT
BAG ZIPLOCK 12X15 (MISCELLANEOUS) ×2 IMPLANT
BIT DRILL 2.5X2.75 QC CALB (BIT) IMPLANT
BIT DRILL 3.5X5.5 QC CALB (BIT) IMPLANT
BNDG COHESIVE 4X5 TAN STRL LF (GAUZE/BANDAGES/DRESSINGS) ×2 IMPLANT
BNDG COHESIVE 6X5 TAN ST LF (GAUZE/BANDAGES/DRESSINGS) ×2 IMPLANT
BNDG ELASTIC 2 VLCR STRL LF (GAUZE/BANDAGES/DRESSINGS) IMPLANT
BNDG ELASTIC 4INX 5YD STR LF (GAUZE/BANDAGES/DRESSINGS) ×2 IMPLANT
BNDG ELASTIC 6INX 5YD STR LF (GAUZE/BANDAGES/DRESSINGS) ×2 IMPLANT
BNDG ELASTIC 6X10 VLCR STRL LF (GAUZE/BANDAGES/DRESSINGS) IMPLANT
COVER SURGICAL LIGHT HANDLE (MISCELLANEOUS) ×2 IMPLANT
CUFF TRNQT CYL 34X4.125X (TOURNIQUET CUFF) ×2 IMPLANT
DRAPE EXTREMITY T 121X128X90 (DISPOSABLE) ×2 IMPLANT
DRAPE OEC MINIVIEW 54X84 (DRAPES) ×2 IMPLANT
DRAPE TOWEL STERILE LF 18X12 (DRAPES) ×2 IMPLANT
DRSG ADAPTIC 3X8 NADH LF (GAUZE/BANDAGES/DRESSINGS) IMPLANT
DRSG EMULSION OIL 3X16 NADH (GAUZE/BANDAGES/DRESSINGS) IMPLANT
DURAPREP 26ML APPLICATOR (WOUND CARE) ×2 IMPLANT
ELECT PENCIL ROCKER SW 15FT (MISCELLANEOUS) ×2 IMPLANT
ELECT REM PT RETURN 15FT ADLT (MISCELLANEOUS) ×2 IMPLANT
GAUZE PAD ABD 8X10 STRL (GAUZE/BANDAGES/DRESSINGS) ×4 IMPLANT
GAUZE SPONGE 4X4 12PLY STRL (GAUZE/BANDAGES/DRESSINGS) ×2 IMPLANT
GLOVE BIO SURGEON STRL SZ8 (GLOVE) ×4 IMPLANT
GLOVE BIOGEL PI IND STRL 8 (GLOVE) ×4 IMPLANT
GOWN STRL REUS W/ TWL XL LVL3 (GOWN DISPOSABLE) ×4 IMPLANT
JUGGERKNOT 1.4 SHORT SNG LOAD (BIT) IMPLANT
KIT BASIN OR (CUSTOM PROCEDURE TRAY) ×2 IMPLANT
KIT TURNOVER KIT A (KITS) ×2 IMPLANT
KWIRE ACE 1.6X6 (WIRE) IMPLANT
NS IRRIG 1000ML POUR BTL (IV SOLUTION) ×2 IMPLANT
PACK TOTAL JOINT (CUSTOM PROCEDURE TRAY) ×2 IMPLANT
PAD CAST 4YDX4 CTTN HI CHSV (CAST SUPPLIES) ×4 IMPLANT
PADDING CAST COTTON 6X4 STRL (CAST SUPPLIES) ×2 IMPLANT
PLATE ACE 100DEG 8HOLE (Plate) IMPLANT
PROTECTOR NERVE ULNAR (MISCELLANEOUS) ×2 IMPLANT
SCREW CORTICAL 3.5MM 16MM (Screw) IMPLANT
SCREW CORTICAL 3.5MM 18MM (Screw) IMPLANT
SCREW CORTICAL 3.5MM 20MM (Screw) IMPLANT
SCREW CORTICAL 3.5MM 24MM (Screw) IMPLANT
SCREW CORTICAL 3.5MM 48MM (Screw) IMPLANT
SCREW CORTICAL 3.5MM 50MM (Screw) IMPLANT
SPIKE FLUID TRANSFER (MISCELLANEOUS) ×2 IMPLANT
SPLINT PLASTER CAST FAST 5X30 (CAST SUPPLIES) IMPLANT
STAPLER SKIN PROX 35W (STAPLE) IMPLANT
STRAP ANKLE DISTRACTOR (MISCELLANEOUS) IMPLANT
STRIP CLOSURE SKIN 1/2X4 (GAUZE/BANDAGES/DRESSINGS) IMPLANT
SUCTION TUBE FRAZIER 10FR DISP (SUCTIONS) ×2 IMPLANT
SUT ETHILON 2 0 PS N (SUTURE) IMPLANT
SUT ETHILON 3 0 PS 1 (SUTURE) IMPLANT
SUT MNCRL AB 4-0 PS2 18 (SUTURE) ×2 IMPLANT
SUT PROLENE 3 0 PS 2 (SUTURE) ×2 IMPLANT
SUT VIC AB 0 CT1 36 (SUTURE) ×2 IMPLANT
SUT VIC AB 2-0 CT1 TAPERPNT 27 (SUTURE) ×2 IMPLANT
SUT VIC AB 3-0 PS2 18XBRD (SUTURE) IMPLANT
SYR CONTROL 10ML LL (SYRINGE) ×2 IMPLANT
WATER STERILE IRR 1000ML POUR (IV SOLUTION) ×2 IMPLANT

## 2023-12-18 NOTE — H&P (Addendum)
 Chief Complaint: Left ankle pain HPI: Patient presents to the OR today for elective treatment of her left ankle fx.  She failed attempted conservative management and presents today for definitive treatment and admission to the hospital.  Allergies:  Allergies  Allergen Reactions   Imitrex [Sumatriptan] Anaphylaxis, Other (See Comments) and Hypertension    Numbness    Banana Other (See Comments)    Stomach cramps    Past Medical History:  Diagnosis Date   Acid reflux    ACL tear 2011   not sure which side   Acute lateral meniscus tear of right knee    Anemia    Anxiety    Arthritis    Back pain    slipped disc lower back lumbar   Bipolar 1 disorder (HCC)    Depression    Dysfunctional uterine bleeding    Eczema    Frequent UTI    History of blood transfusion    Hypertension    Insomnia    Migraine headache    Neuromuscular disorder (HCC)    leg cramps   Osteoarthritis of right knee 12/21/2014   ACL deficit since 2011   Pyelonephritis 10/19/2022   Respiratory distress 01/12/2022   Sepsis with acute hypoxic respiratory failure without septic shock, due to unspecified organism (HCC) 01/06/2022    Past Surgical History:  Procedure Laterality Date   BIOPSY  09/19/2017   Procedure: BIOPSY;  Surgeon: Elicia Claw, MD;  Location: [facility] ENDOSCOPY;  Service: Gastroenterology;;   BIOPSY  10/15/2019   Procedure: BIOPSY;  Surgeon: Dianna Specking, MD;  Location: [facility] ENDOSCOPY;  Service: Endoscopy;;   BIOPSY  04/19/2023   Procedure: BIOPSY;  Surgeon: Suzann Inocente HERO, MD;  Location: [facility] ENDOSCOPY;  Service: Gastroenterology;;   COLONOSCOPY N/A 10/15/2019   Procedure: COLONOSCOPY;  Surgeon: Dianna Specking, MD;  Location: [facility] ENDOSCOPY;  Service: Endoscopy;  Laterality: N/A;   DILITATION & CURRETTAGE/HYSTROSCOPY WITH HYDROTHERMAL ABLATION N/A 04/04/2017   Procedure: DILATATION & CURETTAGE/HYSTEROSCOPY WITH HYDROTHERMAL ABLATION;  Surgeon: Fredirick Glenys RAMAN, MD;  Location: [facility];  Service: Gynecology;  Laterality: N/A;   ESOPHAGOGASTRODUODENOSCOPY N/A 10/15/2019   Procedure: ESOPHAGOGASTRODUODENOSCOPY (EGD);  Surgeon: Dianna Specking, MD;  Location: [facility];  Service: Endoscopy;  Laterality: N/A;   ESOPHAGOGASTRODUODENOSCOPY (EGD) WITH PROPOFOL  N/A 09/19/2017   Procedure: ESOPHAGOGASTRODUODENOSCOPY (EGD) WITH PROPOFOL ;  Surgeon: Elicia Claw, MD;  Location: [facility]OS[facility];  Service: Gastroenterology;  Laterality: N/A;   ESOPHAGOGASTRODUODENOSCOPY (EGD) WITH PROPOFOL  N/A 04/19/2023   Procedure: ESOPHAGOGASTRODUODENOSCOPY (EGD) WITH PROPOFOL ;  Surgeon: Suzann Inocente HERO, MD;  Location: [facility] ENDOSCOPY;  Service: Gastroenterology;  Laterality: N/A;   KNEE ARTHROSCOPY WITH LATERAL MENISECTOMY Right 09/30/2019   Procedure: KNEE ARTHROSCOPY WITH LATERAL MENISECTOMY;  Surgeon: Beverley Evalene BIRCH, MD;  Location: [facility];  Service: Orthopedics;  Laterality: Right;   RIGHT/LEFT HEART CATH AND CORONARY ANGIOGRAPHY N/A 01/09/2022   Procedure: RIGHT/LEFT HEART CATH AND CORONARY ANGIOGRAPHY;  Surgeon: Elmira Newman PARAS, MD;  Location: [facility]VASIVE CV [facility];  Service: Cardiovascular;  Laterality: N/A;   TUBAL LIGATION  yrs ago    Family History: Family History  Problem Relation Age of Onset   Breast cancer Maternal Grandmother    Hypertension Mother    Hypertension Brother    Colon cancer Maternal Uncle    Bipolar disorder Cousin    Schizophrenia Cousin     Social History:   reports that she has been smoking cigarettes. She has a 1.5 pack-year smoking history. She has been exposed to tobacco  smoke. She has never used smokeless tobacco. She reports current alcohol use. She reports that she does not use drugs.  Medications: Medications Prior to Admission  Medication Sig Dispense Refill   acetaminophen  (TYLENOL ) 500 MG tablet Take 1,000 mg by mouth every 6 (six) hours as needed for moderate pain (pain score 4-6).     acetaminophen   (TYLENOL ) 650 MG CR tablet Take 1,300 mg by mouth every 8 (eight) hours as needed for pain.     gabapentin  (NEURONTIN ) 300 MG capsule TAKE 1 CAPSULE BY MOUTH EVERY MORNING AND TAKE 2 CAPSULES BY MOUTH AT BEDTIME (Patient taking differently: Take 300-600 mg by mouth 3 (three) times daily as needed (pain).) 90 capsule 1   ibuprofen  (ADVIL ) 800 MG tablet Take 1 tablet (800 mg total) by mouth every 8 (eight) hours as needed. 21 tablet 0   lisinopril -hydrochlorothiazide  (ZESTORETIC ) 10-12.5 MG tablet Take 1 tablet by mouth daily. 90 tablet 3   oxyCODONE -acetaminophen  (PERCOCET) 5-325 MG tablet Take 1 tablet by mouth every 4 (four) hours as needed for severe pain (pain score 7-10). 20 tablet 0   pantoprazole  (PROTONIX ) 40 MG tablet Take 40 mg by mouth daily.     cyclobenzaprine  (FLEXERIL ) 5 MG tablet Take 5 mg by mouth 3 (three) times daily as needed.     methocarbamol  (ROBAXIN ) 500 MG tablet Take 1 tablet (500 mg total) by mouth 2 (two) times daily as needed for muscle spasms. (Patient not taking: Reported on 12/13/2023) 10 tablet 0    Results for orders placed or performed during the hospital encounter of 12/18/23 (from the past 48 hours)  Pregnancy, urine POC     Status: None   Collection Time: 12/18/23 12:16 PM  Result Value Ref Range   Preg Test, Ur NEGATIVE NEGATIVE    Comment:        THE SENSITIVITY OF THIS METHODOLOGY IS >20 mIU/mL.   CBC per protocol     Status: Abnormal   Collection Time: 12/18/23 12:23 PM  Result Value Ref Range   WBC 5.5 4.0 - 10.5 K/uL   RBC 3.27 (L) 3.87 - 5.11 MIL/uL   Hemoglobin 10.2 (L) 12.0 - 15.0 g/dL   HCT 67.4 (L) 63.9 - 53.9 %   MCV 99.4 80.0 - 100.0 fL   MCH 31.2 26.0 - 34.0 pg   MCHC 31.4 30.0 - 36.0 g/dL   RDW 85.1 88.4 - 84.4 %   Platelets 214 150 - 400 K/uL   nRBC 0.0 0.0 - 0.2 %    Comment: Performed at [facility], [address] [place], [place] [address]    No results found.    Blood pressure (!) 144/97, pulse 72,  temperature 98.1 F (36.7 C), temperature source Oral, resp. rate 18, height 5' 4.5 (1.638 m), weight 81.6 kg, SpO2 100%.  PE:  well nourished and well developed.  NAD.  EOMI.  Resp unlabored.  Left ankle edema.  Tender to palpation medial and lateral malleolus.  Assessment/Plan Left ankle bimalleolar fx  Patient presents to the OR for elective treatment of her left ankle fx.  She will require ORIF left ankle bimalleolar fracture.  After reviewing the procedure, post-operative protocol, and risks of surgery the patient elects for surgical intervention.  She specifically understands the risks of bleeding, infection, nerve damage, blood clots, need for additional surgery, amputation, and even death.  Eva Barrack PA-C [facility] Office:  [phone]   Agree with note above.  To the OR today for ORIF left  ankle bimal fracture and possible syndesmosis and deltoid ligament repair.  The risks and benefits of the alternative treatment options have been discussed in detail.  The patient wishes to proceed with surgery and specifically understands risks of bleeding, infection, nerve damage, blood clots, need for additional surgery, amputation and death.

## 2023-12-18 NOTE — Anesthesia Procedure Notes (Signed)
 Anesthesia Regional Block: Adductor canal block   Pre-Anesthetic Checklist: , timeout performed,  Correct Patient, Correct Site, Correct Laterality,  Correct Procedure, Correct Position, site marked,  Risks and benefits discussed,  Surgical consent,  Pre-op evaluation,  At surgeon's request and post-op pain management  Laterality: Lower and Left  Prep: chloraprep       Needles:  Injection technique: Single-shot  Needle Type: Echogenic Needle     Needle Length: 9cm  Needle Gauge: 21     Additional Needles:   Procedures:,,,, ultrasound used (permanent image in chart),,    Narrative:  Start time: 12/18/2023 12:58 PM End time: 12/18/2023 1:00 PM Injection made incrementally with aspirations every 5 mL.  Performed by: Personally  Anesthesiologist: Boone Fess, MD  Additional Notes: Patient's chart reviewed and they were deemed appropriate candidate for procedure, per surgeon's request. Patient educated about risks, benefits, and alternatives of the block including but not limited to: temporary or permanent nerve damage, bleeding, infection, damage to surround tissues, block failure, local anesthetic toxicity. Patient expressed understanding. A formal time-out was conducted consistent with institution rules.  Monitors were applied, and minimal sedation used (see nursing record). The site was prepped with skin prep and allowed to dry, and sterile gloves were used. A high frequency linear ultrasound probe with probe cover was utilized throughout. Femoral artery visualized at mid-thigh level, local anesthetic injected anterolateral to it, and echogenic block needle trajectory was monitored throughout. Hydrodissection of saphenous nerve visualized and appeared anatomically normal. Aspiration performed every 5ml. Blood vessels were avoided. All injections were performed without resistance and free of blood and paresthesias. The patient tolerated the procedure well.  Injectate: 10ml exparel  +  10ml 0.5% bupivacaine 

## 2023-12-18 NOTE — Anesthesia Procedure Notes (Signed)
 Anesthesia Regional Block: Popliteal block   Pre-Anesthetic Checklist: , timeout performed,  Correct Patient, Correct Site, Correct Laterality,  Correct Procedure, Correct Position, site marked,  Risks and benefits discussed,  Surgical consent,  Pre-op evaluation,  At surgeon's request and post-op pain management  Laterality: Lower and Left  Prep: chloraprep       Needles:  Injection technique: Single-shot  Needle Type: Echogenic Needle     Needle Length: 9cm  Needle Gauge: 21     Additional Needles:   Procedures:,,,, ultrasound used (permanent image in chart),,    Narrative:  Start time: 12/18/2023 12:55 PM End time: 12/18/2023 12:57 PM Injection made incrementally with aspirations every 5 mL.  Performed by: Personally  Anesthesiologist: Boone Fess, MD  Additional Notes: Patient's chart reviewed and they were deemed appropriate candidate for procedure, at surgeon's request. Patient educated about risks, benefits, and alternatives of the block including but not limited to: temporary or permanent nerve damage, bleeding, infection, damage to surround tissues, block failure, local anesthetic toxicity. Patient expressed understanding. A formal time-out was conducted consistent with institution rules.  Monitors were applied, and minimal sedation used. The site was prepped with skin prep and allowed to dry, and sterile gloves were used. A high frequency linear ultrasound probe with probe cover was utilized throughout. Popliteal artery pulsatile and visualized in popliteal fossa along with adjacent sciatic nerve and its branch point, which appeared anatomically normal, local anesthetic injected around them just proximal to the branch point, and echogenic block needle trajectory was monitored throughout. Aspiration performed every 5ml. Blood vessels were avoided. All injections were performed without resistance and free of blood and paresthesias. The patient tolerated the procedure  well.  Injectate: 10ml exparel  + 10ml 0.5% bupivacaine 

## 2023-12-18 NOTE — Anesthesia Preprocedure Evaluation (Signed)
 Anesthesia Evaluation  Patient identified by MRN, date of birth, ID band Patient awake    Reviewed: Allergy & Precautions, NPO status , Patient's Chart, lab work & pertinent test results  History of Anesthesia Complications Negative for: history of anesthetic complications  Airway Mallampati: II  TM Distance: >3 FB Neck ROM: Full    Dental no notable dental hx. (+) Teeth Intact   Pulmonary neg sleep apnea, neg COPD, Current Smoker and Patient abstained from smoking.   Pulmonary exam normal breath sounds clear to auscultation       Cardiovascular Exercise Tolerance: Good METShypertension, (-) CAD and (-) Past MI (-) dysrhythmias  Rhythm:Regular Rate:Normal - Systolic murmurs    Neuro/Psych  Headaches PSYCHIATRIC DISORDERS Anxiety Depression Bipolar Disorder      GI/Hepatic ,GERD  Controlled and Medicated,,(+)     (-) substance abuse    Endo/Other  neg diabetes    Renal/GU negative Renal ROS     Musculoskeletal   Abdominal   Peds  Hematology   Anesthesia Other Findings Past Medical History: No date: Acid reflux 2011: ACL tear     Comment:  not sure which side No date: Acute lateral meniscus tear of right knee No date: Anemia No date: Anxiety No date: Arthritis No date: Back pain     Comment:  slipped disc lower back lumbar No date: Bipolar 1 disorder (HCC) No date: Depression No date: Dysfunctional uterine bleeding No date: Eczema No date: Frequent UTI No date: History of blood transfusion No date: Hypertension No date: Insomnia No date: Migraine headache No date: Neuromuscular disorder [facility])     Comment:  leg cramps 12/21/2014: Osteoarthritis of right knee     Comment:  ACL deficit since 2011 10/19/2022: Pyelonephritis 01/12/2022: Respiratory distress 01/06/2022: Sepsis with acute hypoxic respiratory failure without  septic shock, due to unspecified organism [facility])  Reproductive/Obstetrics                               Anesthesia Physical Anesthesia Plan  ASA: 2  Anesthesia Plan: General   Post-op Pain Management: Regional block*, Ofirmev  IV (intra-op)* and Toradol  IV (intra-op)*   Induction: Intravenous  PONV Risk Score and Plan: 2 and Ondansetron , Dexamethasone  and Midazolam   Airway Management Planned: LMA  Additional Equipment: None  Intra-op Plan:   Post-operative Plan: Extubation in OR  Informed Consent: I have reviewed the patients History and Physical, chart, labs and discussed the procedure including the risks, benefits and alternatives for the proposed anesthesia with the patient or authorized representative who has indicated his/her understanding and acceptance.     Dental advisory given  Plan Discussed with: CRNA and Surgeon  Anesthesia Plan Comments: (Discussed risks of anesthesia with patient, including PONV, sore throat, lip/dental/eye damage. Rare risks discussed as well, such as cardiorespiratory and neurological sequelae, and allergic reactions. Discussed the role of CRNA in patient's perioperative care. Patient understands. Patient counseled on benefits of smoking cessation, and increased perioperative risks associated with continued smoking.  Discussed r/b/a of adductor canal and popliteal nerve block, including:  - bleeding, infection, nerve damage - poor or non functioning block. - reactions and toxicity to local anesthetic Patient understands. )        Anesthesia Quick Evaluation

## 2023-12-18 NOTE — Op Note (Signed)
 12/18/2023  3:05 PM  PATIENT:  Kristin [name]  48 y.o. female  PRE-OPERATIVE DIAGNOSIS: 1.  Left ankle displaced bimalleolar fracture 2.  Left ankle syndesmosis disruption 3.  Left ankle deltoid ligament sprain  POST-OPERATIVE DIAGNOSIS: Same  Procedure(s): 1.  Open treatment left ankle bimalleolar fracture with internal fixation 2.  Open treatment of left syndesmosis disruption with internal fixation 3.  Primary repair of left deltoid ligament 4.  Left ankle AP, mortise and lateral radiographs  SURGEON:  [name], MD  ASSISTANT: Eva Barrack, PA-C  ANESTHESIA:   General, regional  EBL:  minimal   TOURNIQUET:   Total Tourniquet Time Documented: Thigh (Left) - 74 minutes Total: Thigh (Left) - 74 minutes  COMPLICATIONS:  None apparent  DISPOSITION:  Extubated, awake and stable to recovery.  INDICATION FOR PROCEDURE: 48 year old female with a past medical history significant for smoking complains of left ankle pain since an injury a few weeks ago.  She initially had a nondisplaced fracture.  An attempt was made at closed treatment.  However she walked on the injured extremity in the splint and significantly displaced the fracture.  She presents now for operative treatment.  The risks and benefits of the alternative treatment options have been discussed in detail.  The patient wishes to proceed with surgery and specifically understands risks of bleeding, infection, nerve damage, blood clots, need for additional surgery, amputation and death.   PROCEDURE IN DETAIL:  After pre operative consent was obtained, and the correct operative site was identified, the patient was brought to the operating room and placed supine on the OR table.  Anesthesia was administered.  Pre-operative antibiotics were administered.  A surgical timeout was taken.  The left lower extremity was prepped and draped in standard sterile fashion with a tourniquet around the thigh.  The extremity was elevated, and  the tourniquet was inflated to 350 mmHg.  An incision was then made along the lateral malleolus.  Dissection was carried sharply down through the subcutaneous tissues.  The fracture was identified.  The fracture was cleaned of all hematoma and opened with a lamina spreader.  The posterior malleolus fracture was mobilized with a periosteal elevator.  Attention was turned to the medial side of the ankle.  An incision was made over the medial malleolus.  Dissection was carried down through the subcutaneous tissues.  The anteromedial joint line was examined.  The joint capsule was opened.  The medial gutter was cleared of fibrous tissue.  This allowed appropriate reduction of the talus beneath the tibial plafond and.  Attention was returned to the lateral side of the ankle.  The lateral malleolus fracture was reduced and held with a pointed tenaculum and a lobster claw.  A 3.5 mm lag screw from the [employer] small frag set was inserted from posterior to anterior and was noted to have adequate purchase.  An 8 hole one third tubular plate was then contoured to fit the lateral malleolus.  It was secured distally with 3 unicortical screws and proximally with 3 bicortical screws.  The syndesmosis was then reduced.  A King tong clamp was used to compress the syndesmosis holding the ankle in neutral dorsiflexion position.  The central holes in the plate were then drilled through all 4 cortices of the distal tibia and fibula.  Two 3.5 mm fully threaded screws were placed across the syndesmosis and all 4 cortices of the distal tibia and fibula.  Attention was returned to the medial side of the ankle.  The superficial deltoid was noted to be completely avulsed off of the bone.  A 1.45 [employer] juggernaut anchor was inserted after predrilling.  The suture was passed through the layers of the deltoid ligament with the ankle held internally rotated and inverted.  Final AP, mortise and lateral radiographs confirmed  appropriate reduction of the bimalleolar fracture and the syndesmosis.  The posterior malleolus fragment was small and did not involve a significant amount of the articular surface.  The wounds were irrigated copiously and sprinkled with vancomycin powder.  Subcutaneous tissues were approximated with 2-0 Vicryl.  Skin incision was closed with horizontal mattress sutures of 3-0 nylon.  Sterile dressings were applied followed by a well-padded short leg splint.  The tourniquet was released after application of the dressings.  The patient was awakened from anesthesia and transported to the recovery room in stable condition.   FOLLOW UP PLAN: Nonweightbearing on the left lower extremity.  The patient will be admitted for physical therapy, Occupational Therapy and discharge planning.  Xarelto for DVT prophylaxis on discharge due to a history of smoking and Lovenox  while an inpatient.   RADIOGRAPHS: AP, mortise and lateral radiographs of the left ankle are obtained intraoperatively.  These show interval reduction and fixation of the bimalleolar ankle fracture and syndesmosis.  Hardware is appropriately positioned and of the appropriate lengths.  No other acute injuries are noted.`     Eva Barrack PA-C was present and scrubbed for the duration of the operative case. His assistance was essential in positioning the patient, prepping and draping, gaining and maintaining exposure, performing the operation, closing and dressing the wounds and applying the splint.

## 2023-12-18 NOTE — Anesthesia Procedure Notes (Signed)
 Procedure Name: Intubation Date/Time: 12/18/2023 1:51 PM  Performed by: Vincenzo Show, CRNAPre-anesthesia Checklist: Patient identified, Emergency Drugs available, Suction available, Patient being monitored and Timeout performed Patient Re-evaluated:Patient Re-evaluated prior to induction Oxygen Delivery Method: Circle system utilized Preoxygenation: Pre-oxygenation with 100% oxygen Induction Type: IV induction Ventilation: Mask ventilation without difficulty Laryngoscope Size: Mac and 3 Grade View: Grade II Tube type: Oral Tube size: 7.0 mm Number of attempts: 1 Airway Equipment and Method: Stylet Placement Confirmation: ETT inserted through vocal cords under direct vision, positive ETCO2, CO2 detector and breath sounds checked- equal and bilateral Secured at: 22 cm Tube secured with: Tape Dental Injury: Teeth and Oropharynx as per pre-operative assessment  Comments: ATOI

## 2023-12-18 NOTE — Transfer of Care (Signed)
 Immediate Anesthesia Transfer of Care Note  [name]  Procedure(s) Performed: Procedure(s): OPEN REDUCTION INTERNAL FIXATION (ORIF) ANKLE FRACTURE (Left) REPAIR, SYNDESMOSIS, ANKLE (Left) REPAIR, LIGAMENT (Left)  Patient Location: PACU  Anesthesia Type:GA combined with regional for post-op pain  Level of Consciousness:  sedated, patient cooperative and responds to stimulation  Airway & Oxygen Therapy:Patient Spontanous Breathing and Patient connected to face mask oxgen  Post-op Assessment:  Report given to PACU RN and Post -op Vital signs reviewed and stable  Post vital signs:  Reviewed and stable  Last Vitals:  Vitals:   12/18/23 1200  BP: (!) 144/97  Pulse: 72  Resp: 18  Temp: 36.7 C  SpO2: 100%    Complications: No apparent anesthesia complications

## 2023-12-18 NOTE — Discharge Instructions (Signed)
Kristin Simmer, MD [facility]  Please read the following information regarding your care after surgery.  Medications  You only need a prescription for the narcotic pain medicine (ex. oxycodone, Percocet, Norco).  All of the other medicines listed below are available over the counter. ? Aleve 2 pills twice a day for the first 3 days after surgery. ? acetominophen (Tylenol) 650 mg every 4-6 hours as you need for minor to moderate pain ? oxycodone as prescribed for severe pain  Narcotic pain medicine (ex. oxycodone, Percocet, Vicodin) will cause constipation.  To prevent this problem, take the following medicines while you are taking any pain medicine. ? docusate sodium (Colace) 100 mg twice a day ? senna (Senokot) 2 tablets twice a day  ? To help prevent blood clots, take Xarelto as prescribed for two weeks after surgery.  You should also get up every hour while you are awake to move around.    Weight Bearing ? Do not bear any weight on the operated leg or foot.  Cast / Splint / Dressing ? Keep your splint, cast or dressing clean and dry.  Don't put anything (coat hanger, pencil, etc) down inside of it.  If it gets damp, use a hair dryer on the cool setting to dry it.  If it gets soaked, call the office to schedule an appointment for a cast change.    After your dressing, cast or splint is removed; you may shower, but do not soak or scrub the wound.  Allow the water to run over it, and then gently pat it dry.  Swelling It is normal for you to have swelling where you had surgery.  To reduce swelling and pain, keep your toes above your nose for at least 3 days after surgery.  It may be necessary to keep your foot or leg elevated for several weeks.  If it hurts, it should be elevated.  Follow Up Call my office at [phone] when you are discharged from the hospital or surgery center to schedule an appointment to be seen two weeks after surgery.  Call my office at [phone] if you develop  a fever >101.5 F, nausea, vomiting, bleeding from the surgical site or severe pain.

## 2023-12-19 DIAGNOSIS — S82842A Displaced bimalleolar fracture of left lower leg, initial encounter for closed fracture: Secondary | ICD-10-CM | POA: Diagnosis not present

## 2023-12-19 MED ORDER — RIVAROXABAN 10 MG PO TABS: 10.0000 mg | ORAL_TABLET | Freq: Every day | ORAL | 0 refills | Status: DC

## 2023-12-19 MED ORDER — OXYCODONE HCL 5 MG PO TABS: 5.0000 mg | ORAL_TABLET | Freq: Four times a day (QID) | ORAL | 0 refills | Status: AC | PRN

## 2023-12-19 MED ORDER — SENNA 8.6 MG PO TABS: 2.0000 | ORAL_TABLET | Freq: Two times a day (BID) | ORAL | 0 refills | Status: AC

## 2023-12-19 MED ORDER — DOCUSATE SODIUM 100 MG PO CAPS: 100.0000 mg | ORAL_CAPSULE | Freq: Two times a day (BID) | ORAL | 0 refills | Status: DC

## 2023-12-19 NOTE — Evaluation (Signed)
 Physical Therapy Evaluation Patient Details Name: Kristin [name] MRN: [id number] DOB: 1975-09-01 Today's Date: 12/19/2023  History of Present Illness  48 yo female s/p R ankle ORIF  and primary repair of deltoid ligament on 12/18/23.  PMH: anemia, anxiety, bipolar d/o, depression, HTN, , ACL deficit  Clinical Impression  Pt admitted with above diagnosis.  Pt limited by fatigue, requires min assist for short distance ambulation and cues to maintain NWB. Would benefit from HHPT if available, needs RW for home  Pt currently with functional limitations due to the deficits listed below (see PT Problem List). Pt will benefit from acute skilled PT to increase their independence and safety with mobility to allow discharge.           If plan is discharge home, recommend the following: A little help with walking and/or transfers;A little help with bathing/dressing/bathroom;Assistance with cooking/housework;Assist for transportation;Help with stairs or ramp for entrance   Can travel by private vehicle        Equipment Recommendations Rolling walker (2 wheels)  Recommendations for Other Services       Functional Status Assessment Patient has had a recent decline in their functional status and demonstrates the ability to make significant improvements in function in a reasonable and predictable amount of time.     Precautions / Restrictions        Mobility  Bed Mobility Overal bed mobility: Modified Independent                  Transfers Overall transfer level: Needs assistance Equipment used: Rolling walker (2 wheels) Transfers: Sit to/from Stand, Bed to chair/wheelchair/BSC Sit to Stand: Min assist, Contact guard assist           General transfer comment: min A initial stand from EOB to RW, cues for technique and for R LE NWB    Ambulation/Gait Ambulation/Gait assistance: Min assist Gait Distance (Feet): 15 Feet (10') Assistive device: Rolling walker (2 wheels)          General Gait Details: multi-modal cues for technique and NWB. fatigues easily requiring seated rest, effortful gait; reviewed importance and reasoning of NWB  Stairs            Wheelchair Mobility     Tilt Bed    Modified Rankin (Stroke Patients Only)       Balance Overall balance assessment: Needs assistance, History of Falls Sitting-balance support: No upper extremity supported, Feet supported Sitting balance-Leahy Scale: Good     Standing balance support: Bilateral upper extremity supported, During functional activity, Reliant on assistive device for balance Standing balance-Leahy Scale: Poor                               Pertinent Vitals/Pain Pain Assessment Pain Assessment: Faces Faces Pain Scale: Hurts a little bit Pain Location: R LE/residual effects of regional anesthesia present Pain Descriptors / Indicators: Discomfort Pain Intervention(s): Limited activity within patient's tolerance, Monitored during session, Repositioned    Home Living Family/patient expects to be discharged to:: Private residence Living Arrangements: Children;Other relatives Available Help at Discharge: Family;Available 24 hours/day   Home Access: Stairs to enter Entrance Stairs-Rails: Right;Left;Can reach both Entrance Stairs-Number of Steps: 5 Alternate Level Stairs-Number of Steps: pt has been sleeping on couch at daughter's home, flight of stairs to get to kitchen and bathroom Home Layout: Multi-level;Bed/bath upstairs Home Equipment: Cane - single point Additional Comments: pt giving different info regarding home set up  at dtr's    Prior Function Prior Level of Function : Independent/Modified Independent;Working/employed             Mobility Comments: pt reports no AD for mobility/walking ADLs Comments: Ind with ADLs, IADLs, 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     Extremity/Trunk Assessment   Upper Extremity Assessment Upper Extremity Assessment: Defer to OT  evaluation;Right hand dominant    Lower Extremity Assessment Lower Extremity Assessment: LLE deficits/detail LLE Deficits / Details: knee and hip at least 3+/5 LLE: Unable to fully assess due to immobilization    Cervical / Trunk Assessment Cervical / Trunk Assessment: Normal  Communication   Communication Communication: No apparent difficulties    Cognition Arousal: Alert Behavior During Therapy: WFL for tasks assessed/performed   PT - Cognitive impairments: No apparent impairments                         Following commands: Intact       Cueing Cueing Techniques: Verbal cues     General Comments      Exercises     Assessment/Plan    PT Assessment Patient needs continued PT services  PT Problem List Decreased strength;Decreased range of motion;Decreased activity tolerance;Decreased balance;Decreased mobility;Decreased knowledge of use of DME;Decreased knowledge of precautions;Decreased coordination;Decreased safety awareness       PT Treatment Interventions DME instruction;Therapeutic activities;Gait training;Functional mobility training;Stair training;Therapeutic exercise;Balance training;Patient/family education    PT Goals (Current goals can be found in the Care Plan section)  Acute Rehab PT Goals PT Goal Formulation: With patient Time For Goal Achievement: 12/26/23 Potential to Achieve Goals: Good    Frequency Min 5X/week     Co-evaluation PT/OT/SLP Co-Evaluation/Treatment: Yes Reason for Co-Treatment: To address functional/ADL transfers PT goals addressed during session: Mobility/safety with mobility         AM-PAC PT 6 Clicks Mobility  Outcome Measure Help needed turning from your back to your side while in a flat bed without using bedrails?: A Little Help needed moving from lying on your back to sitting on the side of a flat bed without using bedrails?: A Little Help needed moving to and from a bed to a chair (including a  wheelchair)?: A Little Help needed standing up from a chair using your arms (e.g., wheelchair or bedside chair)?: A Little Help needed to walk in hospital room?: A Little Help needed climbing 3-5 steps with a railing? : A Lot 6 Click Score: 17    End of Session Equipment Utilized During Treatment: Gait belt Activity Tolerance: Patient tolerated treatment well Patient left: with call bell/phone within reach;in chair;with chair alarm set Nurse Communication: Mobility status PT Visit Diagnosis: Other abnormalities of gait and mobility (R26.89)    Time: 8863-8796 PT Time Calculation (min) (ACUTE ONLY): 27 min   Charges:   PT Evaluation $PT Eval Low Complexity: 1 Low PT Treatments $Gait Training: 8-22 mins PT General Charges $$ ACUTE PT VISIT: 1 Visit         Katara Griner, PT  Acute Rehab Dept ([facility]) [phone]  12/19/2023   [facility] 12/19/2023, 3:09 PM

## 2023-12-19 NOTE — TOC Initial Note (Signed)
 Transition of Care [facility]) - Initial/Assessment Note    Patient Details  Name: Kristin [name] MRN: [id number] Date of Birth: Mar 02, 1976  Transition of [facility]) CM/SW Contact:    Kristin ASPEN, LCSW Phone Number: 12/19/2023, 3:49 PM  Clinical Narrative:                  Met with pt today to introduce CSW role and review anticipated dc planning needs.  Pt confirms that she has been living with daughter in daughter's apartment and has, on occasion, slept in my car... I'm kind of homeless....  she does anticipate returning to daughter's apartment at dc but trying to figure out how I'll get around.  I really don't want to go to no rest home.   Pt confirms there are steps to enter apartment and unsure about getting to bathroom.  Discussed possible bedside commode.  Explained that, if she were considering SNF option, she should be aware that facility choices would be extremely limited given her Medicaid only coverage.  Pt anticipates dc to daughter's but would like to talk with other family.  Plan to follow up with pt in the morning to confirm dc plan and work on arranging follow up PT if possible.  Expected Discharge Plan: Home w Home Health Services Barriers to Discharge: Continued Medical Work up   Patient Goals and CMS Choice Patient states their goals for this hospitalization and ongoing recovery are:: return home          Expected Discharge Plan and Services In-house Referral: Clinical Social Work     Living arrangements for the past 2 months: Apartment                                      Prior Living Arrangements/Services Living arrangements for the past 2 months: Apartment Lives with:: Adult Children Patient language and need for interpreter reviewed:: Yes Do you feel safe going back to the place where you live?: Yes      Need for Family Participation in Patient Care: Yes (Comment) Care giver support system in place?: Yes (comment)   Criminal Activity/Legal  Involvement Pertinent to Current Situation/Hospitalization: No - Comment as needed  Activities of Daily Living   ADL Screening (condition at time of admission) Independently performs ADLs?: Yes (appropriate for developmental age) Is the patient deaf or have difficulty hearing?: No Does the patient have difficulty seeing, even when wearing glasses/contacts?: No Does the patient have difficulty concentrating, remembering, or making decisions?: No  Permission Sought/Granted Permission sought to share information with : Family Supports Permission granted to share information with : Yes, Verbal Permission Granted  Share Information with NAME: daughter, Kristin [name] @ [phone]           Emotional Assessment Appearance:: Appears stated age Attitude/Demeanor/Rapport: Gracious, Engaged Affect (typically observed): Accepting, Frustrated Orientation: : Oriented to Self, Oriented to Place, Oriented to  Time, Oriented to Situation Alcohol / Substance Use: Not Applicable Psych Involvement: No (comment)  Admission diagnosis:  Closed bimalleolar fracture of left ankle, initial encounter [S82.842A] Bimalleolar fracture of left ankle [S82.842A] Patient Active Problem List   Diagnosis Date Noted   Bimalleolar fracture of left ankle 12/18/2023   Trichomonas infection 08/25/2023   Vaginal lesion 08/25/2023   History of recurrent UTIs 08/14/2023   Elevated ferritin level 08/01/2023   Cyclical vomiting syndrome not associated with migraine 04/19/2023   Gastroesophageal reflux disease 04/18/2023  Neuropathy 06/29/2022   Physical deconditioning 01/10/2022   Stress-induced cardiomyopathy    Acute HFrEF (heart failure with reduced ejection fraction) (HCC) 01/06/2022   Substance abuse (HCC) 01/06/2022   Spinal stenosis of lumbar region with neurogenic claudication 11/25/2021   Primary osteoarthritis of both knees 12/02/2020   Chronic bilateral low back pain without sciatica 12/02/2020    Functional diarrhea    Migraine headache    Normocytic anemia    Recurrent UTI 08/06/2019   Esophagitis determined by endoscopy    AKI (acute kidney injury)    Generalized anxiety disorder 08/23/2015   Eczema 03/26/2012   Alcohol use disorder, mild, abuse 10/26/2010   Bipolar 1 disorder (HCC) 10/09/2010   Panic attacks 10/09/2010   Bilateral chronic knee pain 11/03/2009   Obesity 10/29/2008   Essential hypertension 12/20/2006   PCP:  Kristin Rush, MD Pharmacy:   [id number] - [place], [place] - [address] AT [facility] OF [address] [place] [address] Phone: [phone] Fax: [phone]  [facility] - [place], [place] - [facility] AT [facility] to Registered [facility] Sites [address] [place] [address] Phone: [phone] Fax: [phone]  [id number] [place], [place] - [address] AT [facility] [address] [place] [address] Phone: [phone] Fax: [phone]  [facility] [address] [place] [address] Phone: [phone] Fax: [phone]  [name] [address] [place] [address] Phone: [phone] Fax: [phone]     Social Drivers of Health (SDOH) Social History: SDOH Screenings   Food Insecurity: Food Insecurity Present (12/18/2023)  Housing: High Risk (12/18/2023)  Transportation Needs: No Transportation Needs (12/18/2023)  Utilities: At Risk (12/18/2023)  Depression (PHQ2-9): High Risk (08/24/2023)  Financial Resource Strain: Medium Risk (12/05/2017)  Physical Activity: Sufficiently Active (12/05/2017)  Social Connections: Unknown (12/19/2021)   [facility]  Stress: Stress Concern Present (09/22/2020)  Tobacco Use: High Risk (12/18/2023)   SDOH Interventions:     Readmission Risk  Interventions    12/19/2023    3:38 PM  Readmission Risk Prevention Plan  Transportation Screening Complete  PCP or Specialist Appt within 5-7 Days Complete  Home Care Screening Complete  Medication Review (RN CM) Complete

## 2023-12-19 NOTE — Evaluation (Signed)
 Occupational Therapy Evaluation Patient Details Name: Kristin [name] MRN: [id number] DOB: 1975-05-13 Today's Date: 12/19/2023   History of Present Illness   48 yo female s/p R ankle ORIF  and primary repair of deltoid ligament on 12/18/23.  PMH: anemia, anxiety, bipolar d/o, depression, HTN, , ACL deficit     Clinical Impressions Pt presents with decline in function and safety with ADLs and ADL mobility with impaired balance and endurance, pt is NWB R LE. PTA pt reports that she lives with her daughter with 5 STE with rails and bedrooms/bathrooms on upper an lower levels; pt has been sleeping on couch on main level. 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 Pt currently required CGA with LB ADLs, grooming standing at sink with RW and CGA with commode transfers from RW. R LE NWB reiterated to pt multiple times along with safe technique and use of hopping technique for walking. OT will follow acutely to maximize level of function and safety     If plan is discharge home, recommend the following:   A little help with bathing/dressing/bathroom;A little help with walking and/or transfers;Assistance with cooking/housework;Assist for transportation;Help with stairs or ramp for entrance     Functional Status Assessment   Patient has had a recent decline in their functional status and demonstrates the ability to make significant improvements in function in a reasonable and predictable amount of time.     Equipment Recommendations   BSC/3in1;Other (comment) (RW, reacher)     Recommendations for Other Services         Precautions/Restrictions   Precautions Precautions: Fall Required Braces or Orthoses: Splint/Cast Splint/Cast - Date Prophylactic Dressing Applied (if applicable): 12/18/23 Restrictions Weight Bearing Restrictions Per Provider Order: Yes LLE Weight Bearing Per Provider Order: Non weight bearing     Mobility Bed  Mobility Overal bed mobility: Modified Independent                  Transfers Overall transfer level: Needs assistance Equipment used: Rolling walker (2 wheels) Transfers: Sit to/from Stand, Bed to chair/wheelchair/BSC Sit to Stand: Min assist, Contact guard assist           General transfer comment: min A initial stand from EOB to RW, cues for technique and for R LE NWB      Balance Overall balance assessment: Needs assistance, History of Falls Sitting-balance support: No upper extremity supported, Feet supported Sitting balance-Leahy Scale: Good     Standing balance support: Bilateral upper extremity supported, During functional activity, Reliant on assistive device for balance Standing balance-Leahy Scale: Poor                             ADL either performed or assessed with clinical judgement   ADL Overall ADL's : Needs assistance/impaired Eating/Feeding: Independent   Grooming: Wash/dry hands;Wash/dry face;Contact guard assist;Standing   Upper Body Bathing: Set up;Sitting   Lower Body Bathing: Contact guard assist;Sitting/lateral leans   Upper Body Dressing : Set up;Sitting   Lower Body Dressing: Contact guard assist;Sitting/lateral leans   Toilet Transfer: Minimal assistance;Contact guard assist   Toileting- Clothing Manipulation and Hygiene: Contact guard assist;Sitting/lateral lean;Sit to/from stand       Functional mobility during ADLs: Minimal assistance;Contact guard assist General ADL Comments: min A with initial STS from EOB to RW, CGA with commode and chair transfers     Vision Ability to See in Adequate Light: 0  Adequate Patient Visual Report: No change from baseline       Perception         Praxis         Pertinent Vitals/Pain Pain Assessment Pain Assessment: Faces Faces Pain Scale: Hurts a little bit Pain Location: R LE Pain Descriptors / Indicators: Discomfort     Extremity/Trunk Assessment Upper Extremity  Assessment Upper Extremity Assessment: Right hand dominant   Lower Extremity Assessment Lower Extremity Assessment: Defer to PT evaluation   Cervical / Trunk Assessment Cervical / Trunk Assessment: Normal   Communication Communication Communication: No apparent difficulties   Cognition Arousal: Alert Behavior During Therapy: WFL for tasks assessed/performed Cognition: No apparent impairments                               Following commands: Intact       Cueing  General Comments   Cueing Techniques: Verbal cues      Exercises Other Exercises Other Exercises: Pt instructed on chair push ups to improve B tricep strength for ADL/functional mobility using RW   Shoulder Instructions      Home Living Family/patient expects to be discharged to:: Private residence Living Arrangements: Children;Other relatives Available Help at Discharge: Family;Available 24 hours/day   Home Access: Stairs to enter Entrance Stairs-Number of Steps: 5 Entrance Stairs-Rails: Right;Left;Can reach both Home Layout: Multi-level;Bed/bath upstairs Alternate Level Stairs-Number of Steps: pt has been sleeping on couch at daughter's home, flight of stairs to get to kitchen and bathroom   Bathroom Shower/Tub: Producer, television/film/video: Standard                Prior Functioning/Environment Prior Level of Function : Independent/Modified Independent;Working/employed             Mobility Comments: pt reports no AD for mobility/walking ADLs Comments: Ind with ADLs, IADLs, 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    OT Problem List: Decreased activity tolerance;Impaired balance (sitting and/or standing);Decreased safety awareness;Decreased knowledge of use of DME or AE;Pain   OT Treatment/Interventions: Self-care/ADL training;Therapeutic exercise;Patient/family education;Balance training;Therapeutic activities;DME and/or AE instruction;Energy conservation      OT Goals(Current goals can  be found in the care plan section)   Acute Rehab OT Goals Patient Stated Goal: go home OT Goal Formulation: With patient Time For Goal Achievement: 01/02/24 Potential to Achieve Goals: Good ADL Goals Pt Will Perform Grooming: with supervision;with set-up;standing Pt Will Perform Lower Body Bathing: with supervision;with set-up;sitting/lateral leans Pt Will Perform Lower Body Dressing: with supervision;with set-up;sitting/lateral leans Pt Will Transfer to Toilet: with contact guard assist;with supervision;ambulating Pt Will Perform Toileting - Clothing Manipulation and hygiene: with supervision;sitting/lateral leans;sit to/from stand   OT Frequency:  Min 2X/week    Co-evaluation              AM-PAC OT 6 Clicks Daily Activity     Outcome Measure Help from another person eating meals?: None Help from another person taking care of personal grooming?: A Little Help from another person toileting, which includes using toliet, bedpan, or urinal?: A Little Help from another person bathing (including washing, rinsing, drying)?: A Little Help from another person to put on and taking off regular upper body clothing?: A Little Help from another person to put on and taking off regular lower body clothing?: A Little 6 Click Score: 19   End of Session Equipment Utilized During Treatment: Gait belt;Rolling walker (2 wheels);Other (comment) (3 in 1 over  toilet) Nurse Communication: Mobility status  Activity Tolerance: Patient tolerated treatment well Patient left: in chair;with call bell/phone within reach  OT Visit Diagnosis: Other abnormalities of gait and mobility (R26.89);History of falling (Z91.81);Pain Pain - Right/Left: Left Pain - part of body: Leg;Ankle and joints of foot                Time: 8863-8794 OT Time Calculation (min): 29 min Charges:  OT General Charges $OT Visit: 1 Visit OT Evaluation $OT Eval Moderate Complexity: 1 Mod    Jacques Karna Loose 12/19/2023,  1:10 PM

## 2023-12-19 NOTE — Anesthesia Postprocedure Evaluation (Signed)
 Anesthesia Post Note  [name]  Procedure(s) Performed: OPEN REDUCTION INTERNAL FIXATION (ORIF) ANKLE FRACTURE (Left: Ankle) REPAIR, SYNDESMOSIS, ANKLE (Left) REPAIR, LIGAMENT (Left)     Patient location during evaluation: PACU Anesthesia Type: General Level of consciousness: awake and alert Pain management: pain level controlled Vital Signs Assessment: post-procedure vital signs reviewed and stable Respiratory status: spontaneous breathing, nonlabored ventilation, respiratory function stable and patient connected to nasal cannula oxygen Cardiovascular status: blood pressure returned to baseline and stable Postop Assessment: no apparent nausea or vomiting Anesthetic complications: no   No notable events documented.  Last Vitals:  Vitals:   12/19/23 0951 12/19/23 1430  BP: 134/89 135/82  Pulse: 87 74  Resp: 16 18  Temp: 36.7 C 36.7 C  SpO2: 100% 100%    Last Pain:  Vitals:   12/19/23 1547  TempSrc:   PainSc: 10-Worst pain ever                 Lynwood MARLA Cornea

## 2023-12-19 NOTE — Progress Notes (Signed)
 Subjective: 1 Day Post-Op Procedure(s) (LRB): OPEN REDUCTION INTERNAL FIXATION (ORIF) ANKLE FRACTURE (Left) REPAIR, SYNDESMOSIS, ANKLE (Left) REPAIR, LIGAMENT (Left)  Patient reports no real pain.  States that her foot is completely numb.  Denies fever, chills, N/V, CP, SOB.  Notes that she is ready for breakfast.  Admits to flatus.  Objective:   VITALS:  Temp:  [97.6 F (36.4 C)-98.5 F (36.9 C)] 98.5 F (36.9 C) (10/08 0540) Pulse Rate:  [66-91] 66 (10/08 0540) Resp:  [13-20] 18 (10/08 0540) BP: (119-146)/(81-100) 146/100 (10/08 0540) SpO2:  [96 %-100 %] 100 % (10/08 0540) Weight:  [81.6 kg] 81.6 kg (10/07 1224)  General: WDWN patient in NAD. Psych:  Appropriate mood and affect. Neuro:  A&O x 3, Moving all extremities, sensation diminished to light touch HEENT:  EOMs intact Chest:  Even non-labored respirations Skin:  SLS C/D/I, no rashes or lesions Extremities: warm/dry, no visible edema, erythema or echymosis.  No lymphadenopathy. Pulses: Popliteus 2+ MSK:  ROM: EHL/FHL intact, MMT: able to perform quad set   LABS Recent Labs    12/18/23 1223  HGB 10.2*  WBC 5.5  PLT 214   Recent Labs    12/18/23 1223  NA 139  K 4.1  CL 105  CO2 24  BUN 16  CREATININE 0.89  GLUCOSE 93   No results for input(s): LABPT, INR in the last 72 hours.   Assessment/Plan: 1 Day Post-Op Procedure(s) (LRB): OPEN REDUCTION INTERNAL FIXATION (ORIF) ANKLE FRACTURE (Left) REPAIR, SYNDESMOSIS, ANKLE (Left) REPAIR, LIGAMENT (Left)  NWB L LE  Up with therapy DVT ppx:  Lovenox  in house; transition to Xarelto upon D/C Disp:  likely SNF D/C Rxs on chart.  Oxycodone  prescribed after searching PDMP. Plan for 2 week outpatient f/u with Dr. Kit.  Eva Barrack PA-C [facility] Office:  [phone]

## 2023-12-20 ENCOUNTER — Encounter (HOSPITAL_COMMUNITY): Payer: Self-pay | Admitting: Orthopedic Surgery

## 2023-12-20 DIAGNOSIS — S82842A Displaced bimalleolar fracture of left lower leg, initial encounter for closed fracture: Secondary | ICD-10-CM | POA: Diagnosis not present

## 2023-12-20 MED ORDER — KETOROLAC TROMETHAMINE 15 MG/ML IJ SOLN
15.0000 mg | Freq: Three times a day (TID) | INTRAMUSCULAR | Status: DC | PRN
  Administered 2023-12-20 – 2023-12-21 (×4): 15 mg via INTRAVENOUS
  Filled 2023-12-20 (×4): qty 1

## 2023-12-20 NOTE — Progress Notes (Signed)
 Physical Therapy Treatment Patient Details Name: Kristin [name] MRN: [id number] DOB: March 05, 1976 Today's Date: 12/20/2023   History of Present Illness 47 yo female s/p R ankle ORIF  and primary repair of deltoid ligament on 12/18/23.  PMH: anemia, anxiety, bipolar d/o, depression, HTN, , ACL deficit    PT Comments  Pt is progressing however continues to require cues and reinforcement for NWB; attempted stairs and pt was unable to complete --attempted anterior technique with rails and  posterior technique with RW.   Will continue PT POC   If plan is discharge home, recommend the following: A little help with walking and/or transfers;A little help with bathing/dressing/bathroom;Assistance with cooking/housework;Assist for transportation;Help with stairs or ramp for entrance   Can travel by private vehicle        Equipment Recommendations  Rolling walker (2 wheels)    Recommendations for Other Services       Precautions / Restrictions Precautions Precautions: Fall Recall of Precautions/Restrictions: Impaired Precaution/Restrictions Comments: decr compliance with NWB- pt up in room on PT arrival Required Braces or Orthoses: Splint/Cast Restrictions Weight Bearing Restrictions Per Provider Order: Yes LLE Weight Bearing Per Provider Order: Non weight bearing Other Position/Activity Restrictions: reviewed importance and reasoning of NWB;     Mobility  Bed Mobility Overal bed mobility: Modified Independent                  Transfers Overall transfer level: Needs assistance Equipment used: Rolling walker (2 wheels) Transfers: Sit to/from Stand Sit to Stand: Contact guard assist, Supervision           General transfer comment: cues for hand placement, LLE position    Ambulation/Gait Ambulation/Gait assistance: Contact guard assist Gait Distance (Feet): 30 Feet Assistive device: Rolling walker (2 wheels)         General Gait Details: ongoing education  for NWB  and LLE position. pt tends to put LLE on floor and intermittently shift wt to LLE   Stairs Stairs:  (attempted unable)           Wheelchair Mobility     Tilt Bed    Modified Rankin (Stroke Patients Only)       Balance Overall balance assessment: Needs assistance, History of Falls Sitting-balance support: No upper extremity supported, Feet supported Sitting balance-Leahy Scale: Good     Standing balance support: Bilateral upper extremity supported, During functional activity, Reliant on assistive device for balance Standing balance-Leahy Scale: Poor Standing balance comment: reliant on UE support for safe standing balance; bil UE support on sink for balance while brushing teeth                            Communication Communication Communication: No apparent difficulties  Cognition Arousal: Alert Behavior During Therapy: WFL for tasks assessed/performed   PT - Cognitive impairments: No apparent impairments                         Following commands: Intact      Cueing Cueing Techniques: Verbal cues  Exercises      General Comments        Pertinent Vitals/Pain Pain Assessment Pain Assessment: Faces Faces Pain Scale: Hurts little more Pain Location: right ankle Pain Descriptors / Indicators: Discomfort Pain Intervention(s): Limited activity within patient's tolerance, Monitored during session, Premedicated before session, Repositioned    Home Living  Prior Function            PT Goals (current goals can now be found in the care plan section) Acute Rehab PT Goals PT Goal Formulation: With patient Time For Goal Achievement: 12/26/23 Potential to Achieve Goals: Good Progress towards PT goals: Progressing toward goals    Frequency    Min 5X/week      PT Plan      Co-evaluation              AM-PAC PT 6 Clicks Mobility   Outcome Measure  Help needed turning from your back to  your side while in a flat bed without using bedrails?: None Help needed moving from lying on your back to sitting on the side of a flat bed without using bedrails?: None Help needed moving to and from a bed to a chair (including a wheelchair)?: A Little Help needed standing up from a chair using your arms (e.g., wheelchair or bedside chair)?: A Little Help needed to walk in hospital room?: A Little Help needed climbing 3-5 steps with a railing? : Total 6 Click Score: 18    End of Session Equipment Utilized During Treatment: Gait belt Activity Tolerance: Patient tolerated treatment well Patient left: in bed;with call bell/phone within reach;with bed alarm set Nurse Communication: Mobility status PT Visit Diagnosis: Other abnormalities of gait and mobility (R26.89)     Time: 8792-8768 PT Time Calculation (min) (ACUTE ONLY): 24 min  Charges:    $Gait Training: 23-37 mins PT General Charges $$ ACUTE PT VISIT: 1 Visit                     Jamita Mckelvin, PT  Acute Rehab Dept [facility]) [phone]  12/20/2023    [facility] 12/20/2023, 12:48 PM

## 2023-12-20 NOTE — Progress Notes (Signed)
 PT TX NOTE  12/20/23 1400  PT Visit Information  Last PT Received On 12/20/23  Assistance Needed Continued stair training this pm, pt able to ascend 2 short stairs with RW using posterior technique and +2 min assist, unable to progress further d/t RLE and UE weakness and fatigue. Pt states her dtr's home has  5 steps and she does not know about rails, she reports there are 17 steps to get to bathroom in the split level apartment Discussed that going up and down 17 steps multiple times per day and maintaining NWB is not a feasible plan for home at this time. Pt will need to sponge bathe, get BSC or have another d/c plan. Pt verbalizes understanding and is checklng with friends.   History of Present Illness 48 yo female s/p R ankle ORIF  and primary repair of deltoid ligament on 12/18/23.  PMH: anemia, anxiety, bipolar d/o, depression, HTN, , ACL deficit  Precautions  Precautions Fall  Recall of Precautions/Restrictions Impaired  Precaution/Restrictions Comments decr compliance with NWB- pt up in room on PT arrival  Required Braces or Orthoses Splint/Cast  Restrictions  Weight Bearing Restrictions Per Provider Order Yes  LLE Weight Bearing Per Provider Order NWB  Other Position/Activity Restrictions reviewed importance and reasoning of NWB;  Pain Assessment  Pain Assessment Faces  Faces Pain Scale 2  Pain Location right ankle  Pain Descriptors / Indicators Discomfort  Pain Intervention(s) Limited activity within patient's tolerance;Monitored during session;Premedicated before session;Repositioned  Cognition  Arousal Alert  Behavior During Therapy WFL for tasks assessed/performed  PT - Cognitive impairments No apparent impairments  Following Commands  Following commands Intact  Cueing  Cueing Techniques Verbal cues  Communication  Communication No apparent difficulties  Bed Mobility  Overal bed mobility Modified Independent  Transfers  Overall transfer level Needs assistance   Equipment used Rolling walker (2 wheels)  Transfers Sit to/from Stand  Sit to Stand Contact guard assist;Supervision  General transfer comment ongoing education for hand placement, LLE position  Ambulation/Gait  Ambulation/Gait assistance Contact guard assist  Gait Distance (Feet) 30 Feet  Assistive device Rolling walker (2 wheels)  General Gait Details ongoing education  for NWB and LLE position. pt tends to put LLE on floor and intermittently shift wt to LLE-improved compliance compared to am session  Pre-gait activities standing using UEs to offload LEs, repeated lifting R foot up/back down to floor x3 (pre-stair training)  Stairs Yes  Stairs assistance Min assist;+2 physical assistance;+2 safety/equipment  Stair Management Step to pattern;Backwards;With walker  Number of Stairs 2  General stair comments cues for sequence, self assist and technique. increased time needed. pt anxious regarding stair practice  Balance  Overall balance assessment Needs assistance;History of Falls  Sitting-balance support No upper extremity supported;Feet supported  Sitting balance-Leahy Scale Good  Standing balance support Bilateral upper extremity supported;During functional activity;Reliant on assistive device for balance  Standing balance-Leahy Scale Poor  Standing balance comment reliant on UE support for safe standing balance; bil UE support on sink for balance while brushing teeth  PT - End of Session  Equipment Utilized During Treatment Gait belt  Activity Tolerance Patient tolerated treatment well  Patient left in bed;with call bell/phone within reach;with bed alarm set  Nurse Communication Mobility status   PT - Assessment/Plan  PT Visit Diagnosis Other abnormalities of gait and mobility (R26.89)  PT Frequency (ACUTE ONLY) Min 6X/week  Follow Up Recommendations Other (comment) (needs HHPT, has medicaid)  Patient can return home with the following  A little help with walking and/or  transfers;A little help with bathing/dressing/bathroom;Assistance with cooking/housework;Assist for transportation;Help with stairs or ramp for entrance  PT equipment Rolling walker (2 wheels)  AM-PAC PT 6 Clicks Mobility Outcome Measure (Version 2)  Help needed turning from your back to your side while in a flat bed without using bedrails? 4  Help needed moving from lying on your back to sitting on the side of a flat bed without using bedrails? 4  Help needed moving to and from a bed to a chair (including a wheelchair)? 3  Help needed standing up from a chair using your arms (e.g., wheelchair or bedside chair)? 3  Help needed to walk in hospital room? 3  Help needed climbing 3-5 steps with a railing?  2  6 Click Score 19  Consider Recommendation of Discharge To: Home with [facility]  PT Goal Progression  Progress towards PT goals Progressing toward goals  Acute Rehab PT Goals  PT Goal Formulation With patient  Time For Goal Achievement 12/26/23  Potential to Achieve Goals Good  PT Time Calculation  PT Start Time (ACUTE ONLY) 1316  PT Stop Time (ACUTE ONLY) 1331  PT Time Calculation (min) (ACUTE ONLY) 15 min  PT General Charges  $$ ACUTE PT VISIT 1 Visit  PT Treatments  $Gait Training 8-22 mins

## 2023-12-20 NOTE — Plan of Care (Signed)
  Problem: Clinical Measurements: Goal: Will remain free from infection Outcome: Progressing Goal: Respiratory complications will improve Outcome: Progressing Goal: Cardiovascular complication will be avoided Outcome: Progressing   Problem: Activity: Goal: Risk for activity intolerance will decrease Outcome: Progressing   Problem: Nutrition: Goal: Adequate nutrition will be maintained Outcome: Progressing   Problem: Coping: Goal: Level of anxiety will decrease Outcome: Progressing   Problem: Elimination: Goal: Will not experience complications related to bowel motility Outcome: Progressing Goal: Will not experience complications related to urinary retention Outcome: Progressing   Problem: Pain Managment: Goal: General experience of comfort will improve and/or be controlled Outcome: Progressing   Problem: Safety: Goal: Ability to remain free from injury will improve Outcome: Progressing   Problem: Skin Integrity: Goal: Risk for impaired skin integrity will decrease Outcome: Progressing

## 2023-12-20 NOTE — Progress Notes (Signed)
 Patient states she plans to go to daughter's apartment at discharge and will now stay in a downstairs bedroom with no steps to enter.  Donzell Barge, RN

## 2023-12-20 NOTE — Progress Notes (Addendum)
 Subjective: 2 Days Post-Op Procedure(s) (LRB): OPEN REDUCTION INTERNAL FIXATION (ORIF) ANKLE FRACTURE (Left) REPAIR, SYNDESMOSIS, ANKLE (Left) REPAIR, LIGAMENT (Left)  Patient reports pain as moderate.  Notes that numbness in foot is starting to wear off.  States that she worked well with therapy.  Is hopeful to D/C home with her sister.  Tolerating POs well.  Admits to BM.  Objective:   VITALS:  Temp:  [98.1 F (36.7 C)-99.5 F (37.5 C)] 99.2 F (37.3 C) (10/09 0629) Pulse Rate:  [70-88] 72 (10/09 0629) Resp:  [16-18] 18 (10/09 0629) BP: (124-141)/(82-92) 133/92 (10/09 0629) SpO2:  [99 %-100 %] 100 % (10/09 0629)  General: WDWN patient in NAD. Psych:  Appropriate mood and affect. Neuro:  A&O x 3, Moving all extremities, sensation intact to light touch HEENT:  EOMs intact Chest:  Even non-labored respirations Skin:  SLS C/D/I, no rashes or lesions Extremities: warm/dry, no visible edema, erythema or echymosis.  No lymphadenopathy. Pulses: Popliteus 2+ MSK:  ROM: EHL/FHL intact, MMT: able to perform quad set   LABS Recent Labs    12/18/23 1223  HGB 10.2*  WBC 5.5  PLT 214   Recent Labs    12/18/23 1223  NA 139  K 4.1  CL 105  CO2 24  BUN 16  CREATININE 0.89  GLUCOSE 93   No results for input(s): LABPT, INR in the last 72 hours.   Assessment/Plan: 2 Days Post-Op Procedure(s) (LRB): OPEN REDUCTION INTERNAL FIXATION (ORIF) ANKLE FRACTURE (Left) REPAIR, SYNDESMOSIS, ANKLE (Left) REPAIR, LIGAMENT (Left)  NWB L LE Up with therapy DVT ppx:  lovenox  in house; transition to Xarelto upon D/C Printed D/C Rxs on chart.  I'll also send a Rx for a short course of toradol  to the patient's pharmacy. Disp:  likely home with HHPT Please let me know if patient when patient is ready for D/C and I'll place order. Plan for 2 week outpatient post-op visit.  Addendum:  Will Rx toradol  for her while she's admitted.  Will hold of on the the toradol  in the outpatient  setting.  Hopefully she won't need it at that point.  Eva Barrack PA-C [facility] Office:  [phone]

## 2023-12-21 ENCOUNTER — Other Ambulatory Visit (HOSPITAL_COMMUNITY): Payer: Self-pay

## 2023-12-21 DIAGNOSIS — S82842A Displaced bimalleolar fracture of left lower leg, initial encounter for closed fracture: Secondary | ICD-10-CM | POA: Diagnosis not present

## 2023-12-21 MED ORDER — METHOCARBAMOL 500 MG PO TABS
500.0000 mg | ORAL_TABLET | Freq: Four times a day (QID) | ORAL | 0 refills | Status: AC | PRN
  Filled 2023-12-21: qty 30, 8d supply, fill #0

## 2023-12-21 NOTE — Plan of Care (Signed)
  Problem: Safety: Goal: Ability to remain free from injury will improve Outcome: Progressing   Problem: Pain Managment: Goal: General experience of comfort will improve and/or be controlled Outcome: Progressing   Problem: Elimination: Goal: Will not experience complications related to urinary retention Outcome: Progressing   Problem: Clinical Measurements: Goal: Ability to maintain clinical measurements within normal limits will improve Outcome: Progressing

## 2023-12-21 NOTE — Discharge Summary (Signed)
 Physician Discharge Summary  Patient ID: Kristin [name] MRN: [id number] DOB/AGE: [birth year] 50 y.o.  Admit date: 12/18/2023 Discharge date: 12/21/2023  Admission Diagnoses: left ankle bimalleolar fracture; hx of bipolar, obesity, HTN, panic attacks, alcohol disorder, anxiety, AKI, esophagitis, UTI, anemia, migraine, diarrhea, bilateral knee OA, LBP, acute HF, GERD, substance abuse, neuropathy, and elevate ferritin level.  Discharge Diagnoses:  Principal Problem:   Bimalleolar fracture of left ankle Same as above  Discharged Condition: stable  Hospital Course: Patient presented to the [facility] long OR for elective ORIF of left ankle bimalleolar fracture by Dr. Kit.  She tolerated procedure well any complications.  She was then admitted to the hospital.  She worked well with physical therapy.  She tolerated her stay well without incident.  She is to be discharged home with home health PT.  Consults: PT/OT  Significant Diagnostic Studies: Radiographs to ensure satisfactory anatomic alignment.  Treatments: IV hydration, antibiotics: Ancef , analgesia: acetaminophen , Dilaudid , and oxycodone , cardiac meds: lisinopril  (Zestril ) and HCTZ, anticoagulation: lovenox , and surgery: as stated above  Discharge Exam: Blood pressure 121/85, pulse 69, temperature 98.1 F (36.7 C), temperature source Oral, resp. rate 18, height 5' 4.5 (1.638 m), weight 81.6 kg, SpO2 100%. General: WDWN patient in NAD. Psych:  Appropriate mood and affect. Neuro:  A&O x 3, Moving all extremities, sensation intact to light touch HEENT:  EOMs intact Chest:  Even non-labored respirations Skin:  SLS C/D/I, no rashes or lesions Extremities: warm/dry, no visible edema, erythema or echymosis.  No lymphadenopathy. Pulses: Popliteus 2+ MSK:  ROM: EHL/FHL, MMT: able to perform quad set  Disposition: Discharge disposition: 01-Home or Self Care       Discharge Instructions     Call MD / Call 911   Complete by: As  directed    If you experience chest pain or shortness of breath, CALL 911 and be transported to the hospital emergency room.  If you develope a fever above 101 F, pus (white drainage) or increased drainage or redness at the wound, or calf pain, call your surgeon's office.   Constipation Prevention   Complete by: As directed    Drink plenty of fluids.  Prune juice may be helpful.  You may use a stool softener, such as Colace (over the counter) 100 mg twice a day.  Use MiraLax  (over the counter) for constipation as needed.   Diet - low sodium heart healthy   Complete by: As directed    Increase activity slowly as tolerated   Complete by: As directed    Non weight bearing   Complete by: As directed    Laterality: left   Extremity: Lower   Post-operative opioid taper instructions:   Complete by: As directed    POST-OPERATIVE OPIOID TAPER INSTRUCTIONS: It is important to wean off of your opioid medication as soon as possible. If you do not need pain medication after your surgery it is ok to stop day one. Opioids include: Codeine, Hydrocodone (Norco, Vicodin), Oxycodone (Percocet, oxycontin ) and hydromorphone  amongst others.  Long term and even short term use of opiods can cause: Increased pain response Dependence Constipation Depression Respiratory depression And more.  Withdrawal symptoms can include Flu like symptoms Nausea, vomiting And more Techniques to manage these symptoms Hydrate well Eat regular healthy meals Stay active Use relaxation techniques(deep breathing, meditating, yoga) Do Not substitute Alcohol to help with tapering If you have been on opioids for less than two weeks and do not have pain than it is ok to stop all together.  Plan to wean off of opioids This plan should start within one week post op of your joint replacement. Maintain the same interval or time between taking each dose and first decrease the dose.  Cut the total daily intake of opioids by one tablet  each day Next start to increase the time between doses. The last dose that should be eliminated is the evening dose.         Allergies as of 12/21/2023       Reactions   Imitrex [sumatriptan] Anaphylaxis, Other (See Comments), Hypertension   Numbness    Banana Other (See Comments)   Stomach cramps        Medication List     STOP taking these medications    ibuprofen  800 MG tablet Commonly known as: ADVIL    oxyCODONE -acetaminophen  5-325 MG tablet Commonly known as: Percocet       TAKE these medications    acetaminophen  650 MG CR tablet Commonly known as: TYLENOL  Take 1,300 mg by mouth every 8 (eight) hours as needed for pain.   acetaminophen  500 MG tablet Commonly known as: TYLENOL  Take 1,000 mg by mouth every 6 (six) hours as needed for moderate pain (pain score 4-6).   cyclobenzaprine  5 MG tablet Commonly known as: FLEXERIL  Take 5 mg by mouth 3 (three) times daily as needed.   docusate sodium  100 MG capsule Commonly known as: Colace Take 1 capsule (100 mg total) by mouth 2 (two) times daily. While taking narcotic pain medicine.   gabapentin  300 MG capsule Commonly known as: NEURONTIN  TAKE 1 CAPSULE BY MOUTH EVERY MORNING AND TAKE 2 CAPSULES BY MOUTH AT BEDTIME What changed: See the new instructions.   lisinopril -hydrochlorothiazide  10-12.5 MG tablet Commonly known as: ZESTORETIC  Take 1 tablet by mouth daily.   methocarbamol  500 MG tablet Commonly known as: ROBAXIN  Take 1 tablet (500 mg total) by mouth 2 (two) times daily as needed for muscle spasms. What changed: Another medication with the same name was added. Make sure you understand how and when to take each.   methocarbamol  500 MG tablet Commonly known as: ROBAXIN  Take 1 tablet (500 mg total) by mouth every 6 (six) hours as needed for muscle spasms. What changed: You were already taking a medication with the same name, and this prescription was added. Make sure you understand how and when to  take each.   oxyCODONE  5 MG immediate release tablet Commonly known as: Roxicodone  Take 1 tablet (5 mg total) by mouth every 6 (six) hours as needed for up to 5 days.   pantoprazole  40 MG tablet Commonly known as: PROTONIX  Take 40 mg by mouth daily.   rivaroxaban 10 MG Tabs tablet Commonly known as: Xarelto Take 1 tablet (10 mg total) by mouth daily.   senna 8.6 MG Tabs tablet Commonly known as: SENOKOT Take 2 tablets (17.2 mg total) by mouth 2 (two) times daily.               Discharge Care Instructions  (From admission, onward)           Start     Ordered   12/21/23 0000  Non weight bearing       Question Answer Comment  Laterality left   Extremity Lower      12/21/23 0715            Follow-up Information     Kit Rush, MD. Schedule an appointment as soon as possible for a visit in 2 week(s).   Specialty:  Orthopedic Surgery Contact information: [address] [place] [address] [place] [address] [phone]                 Signed: Eva Aniceto DEVONNA Dareen Office:  [phone]

## 2023-12-21 NOTE — TOC Transition Note (Signed)
 Transition of Care [facility]) - Discharge Note   Patient Details  Name: Kristin [name] MRN: [id number] Date of Birth: Jan 15, 1976  [facility] of [facility]) CM/SW Contact:  NORMAN ASPEN, LCSW Phone Number: 12/21/2023, 3:13 PM   Clinical Narrative:     Alerted pt that CSW unable to find any [facility] agency willing to accept for HHPT services due to Medicaid coverage.  Pt understands and I encouraged her to speak with ortho MD in follow up about possible outpatient PT.  Pt did need RW for home and had no DME agency preference - item ordered via [employer] and delivered to room.  No further IP CM needs.  Final next level of care: Home/Self Care Barriers to Discharge: No Home Care Agency will accept this patient   Patient Goals and CMS Choice Patient states their goals for this hospitalization and ongoing recovery are:: return home          Discharge Placement                       Discharge Plan and Services Additional resources added to the After Visit Summary for   In-house Referral: Clinical Social Work              DME Arranged: Vannie rolling DME Agency: [employer] Date DME Agency Contacted: 12/20/23   Representative spoke with at DME Agency: Cyndee            Social Drivers of Health (SDOH) Interventions SDOH Screenings   Food Insecurity: Food Insecurity Present (12/18/2023)  Housing: High Risk (12/18/2023)  Transportation Needs: No Transportation Needs (12/18/2023)  Utilities: At Risk (12/18/2023)  Depression (PHQ2-9): High Risk (08/24/2023)  Financial Resource Strain: Medium Risk (12/05/2017)  Physical Activity: Sufficiently Active (12/05/2017)  Social Connections: Unknown (12/19/2021)   Received from [facility]  Stress: Stress Concern Present (09/22/2020)  Tobacco Use: High Risk (12/18/2023)     Readmission Risk Interventions    12/19/2023    3:38 PM  Readmission Risk Prevention Plan  Transportation Screening Complete  PCP or Specialist Appt within 5-7 Days  Complete  Home Care Screening Complete  Medication Review (RN CM) Complete

## 2023-12-21 NOTE — Progress Notes (Signed)
 Discharge medications delivered to patient at the bedside in a secure bag.

## 2023-12-21 NOTE — Progress Notes (Signed)
 Subjective: 3 Days Post-Op Procedure(s) (LRB): OPEN REDUCTION INTERNAL FIXATION (ORIF) ANKLE FRACTURE (Left) REPAIR, SYNDESMOSIS, ANKLE (Left) REPAIR, LIGAMENT (Left)  Patient reports pain as mild to moderate.  Reports that she has struggled with some muscle spasms.  Tolerating POs well.  Admits to BM. Plan to D/C to daughter's apartment that is one level living without stairs.  States that she is ready for D/C.  Objective:   VITALS:  Temp:  [98.1 F (36.7 C)-99.2 F (37.3 C)] 98.1 F (36.7 C) (10/10 0534) Pulse Rate:  [69-89] 69 (10/10 0534) Resp:  [16-19] 18 (10/10 0534) BP: (119-132)/(85-89) 121/85 (10/10 0534) SpO2:  [100 %] 100 % (10/10 0534)  General: WDWN patient in NAD. Psych:  Appropriate mood and affect. Neuro:  A&O x 3, Moving all extremities, sensation intact to light touch HEENT:  EOMs intact Chest:  Even non-labored respirations Skin:  SLS C/D/I, no rashes or lesions Extremities: warm/dry, no visible edema, erythema or echymosis.  No lymphadenopathy. Pulses: Popliteus 2+ MSK:  ROM: EHL/FHL, MMT: able to perform quad set   LABS Recent Labs    12/18/23 1223  HGB 10.2*  WBC 5.5  PLT 214   Recent Labs    12/18/23 1223  NA 139  K 4.1  CL 105  CO2 24  BUN 16  CREATININE 0.89  GLUCOSE 93   No results for input(s): LABPT, INR in the last 72 hours.   Assessment/Plan: 3 Days Post-Op Procedure(s) (LRB): OPEN REDUCTION INTERNAL FIXATION (ORIF) ANKLE FRACTURE (Left) REPAIR, SYNDESMOSIS, ANKLE (Left) REPAIR, LIGAMENT (Left)  NWB L LE Up with therapy. I have sent a Rx for robaxin  to her pharmacy for muscle spasm. Other D/C Rxs on chart. D/C home today with HHPT. Plan for 2 week outpatient post-op visit.  Eva Barrack PA-C [facility] Office:  [phone]

## 2024-01-28 ENCOUNTER — Ambulatory Visit
Admission: RE | Admit: 2024-01-28 | Discharge: 2024-01-28 | Disposition: A | Source: Ambulatory Visit | Attending: Orthopedic Surgery | Admitting: Orthopedic Surgery

## 2024-01-28 ENCOUNTER — Other Ambulatory Visit: Payer: Self-pay | Admitting: Orthopedic Surgery

## 2024-01-28 DIAGNOSIS — T849XXA Unspecified complication of internal orthopedic prosthetic device, implant and graft, initial encounter: Secondary | ICD-10-CM

## 2024-01-28 DIAGNOSIS — T8484XA Pain due to internal orthopedic prosthetic devices, implants and grafts, initial encounter: Secondary | ICD-10-CM

## 2024-01-30 ENCOUNTER — Other Ambulatory Visit: Payer: Self-pay | Admitting: Orthopedic Surgery

## 2024-01-30 NOTE — Progress Notes (Addendum)
 Anesthesia Review:  PCP: Lauraine Molt LOV 6/1`3/25  Cardiologist : none   PPM/ ICD: Device Orders: Rep Notified:  Chest x-ray : EKG :02/05/24    MR card- 2023  Echo : 04/17/23  Stress test: Cardiac Cath :  2023   Activity level: can do a flgiht of stairs wtihout difficutly  Sleep Study/ CPAP : none  Fasting Blood Sugar :      / Checks Blood Sugar -- times a day:    Blood Thinner/ Instructions /Last Dose: ASA / Instructions/ Last Dose :    Blood pressure was 143/103.  Pt denies any chest pain, shortness of breath, dizziness, headache or blurred vision. PT states she has been rushing around today taking food to grandchilds school for Thanksgiving meal and coming to appt.

## 2024-02-04 NOTE — Patient Instructions (Signed)
 SURGICAL WAITING ROOM VISITATION  Patients having surgery or a procedure may have no more than 2 support people in the waiting area - these visitors may rotate.    Children under the age of 76 must have an adult with them who is not the patient.  Visitors with respiratory illnesses are discouraged from visiting and should remain at home.  If the patient needs to stay at the hospital during part of their recovery, the visitor guidelines for inpatient rooms apply. Pre-op nurse will coordinate an appropriate time for 1 support person to accompany patient in pre-op.  This support person may not rotate.    Please refer to the [facility] website for the visitor guidelines for Inpatients (after your surgery is over and you are in a regular room).       Your procedure is scheduled on:  02/12/2024    Report to [facility] Main Entrance    Report to admitting at   215 pm    Call this number if you have problems the morning of surgery [phone]   Do not eat food :After Midnight.   After Midnight you may have the following liquids until  115 pm _____  DAY OF SURGERY  Water  Non-Citrus Juices (without pulp, NO RED-Apple, White grape, White cranberry) Black Coffee (NO MILK/CREAM OR CREAMERS, sugar ok)  Clear Tea (NO MILK/CREAM OR CREAMERS, sugar ok) regular and decaf                             Plain Jell-O (NO RED)                                           Fruit ices (not with fruit pulp, NO RED)                                     Popsicles (NO RED)                                                               Sports drinks like Gatorade (NO RED)                    The day of surgery:  Drink ONE (1) Pre-Surgery Clear Ensure or G2 at  115 pm  the morning of surgery. Drink in one sitting. Do not sip.  This drink was given to you during your hospital  pre-op appointment visit. Nothing else to drink after completing the  Pre-Surgery Clear Ensure or G2.          If you have  questions, please contact your surgeon's office.       Oral Hygiene is also important to reduce your risk of infection.                                    Remember - BRUSH YOUR TEETH THE MORNING OF SURGERY WITH YOUR REGULAR TOOTHPASTE  DENTURES WILL BE REMOVED PRIOR TO SURGERY PLEASE DO  NOT APPLY Poly grip OR ADHESIVES!!!   Do NOT smoke after Midnight   Stop all vitamins and herbal supplements 7 days before surgery.   Take these medicines the morning of surgery with A SIP OF WATER :  none   DO NOT TAKE ANY ORAL DIABETIC MEDICATIONS DAY OF YOUR SURGERY  Bring CPAP mask and tubing day of surgery.                              You may not have any metal on your body including hair pins, jewelry, and body piercing             Do not wear make-up, lotions, powders, perfumes/cologne, or deodorant  Do not wear nail polish including gel and S&S, artificial/acrylic nails, or any other type of covering on natural nails including finger and toenails. If you have artificial nails, gel coating, etc. that needs to be removed by a nail salon please have this removed prior to surgery or surgery may need to be canceled/ delayed if the surgeon/ anesthesia feels like they are unable to be safely monitored.   Do not shave  48 hours prior to surgery.               Men may shave face and neck.   Do not bring valuables to the hospital. [facility] IS NOT             RESPONSIBLE   FOR VALUABLES.   Contacts, glasses, dentures or bridgework may not be worn into surgery.   Bring small overnight bag day of surgery.   DO NOT BRING YOUR HOME MEDICATIONS TO THE HOSPITAL. PHARMACY WILL DISPENSE MEDICATIONS LISTED ON YOUR MEDICATION LIST TO YOU DURING YOUR ADMISSION IN THE HOSPITAL!    Patients discharged on the day of surgery will not be allowed to drive home.  Someone NEEDS to stay with you for the first 24 hours after anesthesia.   Special Instructions: Bring a copy of your healthcare power of attorney  and living will documents the day of surgery if you haven't scanned them before.              Please read over the following fact sheets you were given: IF YOU HAVE QUESTIONS ABOUT YOUR PRE-OP INSTRUCTIONS PLEASE CALL [phone].   If you received a COVID test during your pre-op visit  it is requested that you wear a mask when out in public, stay away from anyone that may not be feeling well and notify your surgeon if you develop symptoms. If you test positive for Covid or have been in contact with anyone that has tested positive in the last 10 days please notify you surgeon.    [place] - Preparing for Surgery Before surgery, you can play an important role.  Because skin is not sterile, your skin needs to be as free of germs as possible.  You can reduce the number of germs on your skin by washing with CHG (chlorahexidine gluconate) soap before surgery.  CHG is an antiseptic cleaner which kills germs and bonds with the skin to continue killing germs even after washing. Please DO NOT use if you have an allergy to CHG or antibacterial soaps.  If your skin becomes reddened/irritated stop using the CHG and inform your nurse when you arrive at Short Stay. Do not shave (including legs and underarms) for at least 48 hours prior to the first CHG shower.  You may shave your face/neck.  Please follow these instructions carefully:  1.  Shower with CHG Soap the night before surgery ONLY (DO NOT USE THE SOAP THE MORNING OF SURGERY).  2.  If you choose to wash your hair, wash your hair first as usual with your normal  shampoo.  3.  After you shampoo, rinse your hair and body thoroughly to remove the shampoo.                             4.  Use CHG as you would any other liquid soap.  You can apply chg directly to the skin and wash.  Gently with a scrungie or clean washcloth.  5.  Apply the CHG Soap to your body ONLY FROM THE NECK DOWN.   Do   not use on face/ open                           Wound or open sores.  Avoid contact with eyes, ears mouth and   genitals (private parts).                       Wash face,  Genitals (private parts) with your normal soap.             6.  Wash thoroughly, paying special attention to the area where your    surgery  will be performed.  7.  Thoroughly rinse your body with warm water  from the neck down.  8.  DO NOT shower/wash with your normal soap after using and rinsing off the CHG Soap.                9.  Pat yourself dry with a clean towel.            10.  Wear clean pajamas.            11.  Place clean sheets on your bed the night of your first shower and do not  sleep with pets. Day of Surgery : Do not apply any CHG, lotions/deodorants the morning of surgery.  Please wear clean clothes to the hospital/surgery center.  FAILURE TO FOLLOW THESE INSTRUCTIONS MAY RESULT IN THE CANCELLATION OF YOUR SURGERY  PATIENT SIGNATURE_________________________________  NURSE SIGNATURE__________________________________  ________________________________________________________________________[facility] - Preparing for Surgery Before surgery, you can play an important role.  Because skin is not sterile, your skin needs to be as free of germs as possible.  You can reduce the number of germs on your skin by washing with CHG (chlorahexidine gluconate) soap before surgery.  CHG is an antiseptic cleaner which kills germs and bonds with the skin to continue killing germs even after washing. Please DO NOT use if you have an allergy to CHG or antibacterial soaps.  If your skin becomes reddened/irritated stop using the CHG and inform your nurse when you arrive at Short Stay. Do not shave (including legs and underarms) for at least 48 hours prior to the first CHG shower.  You may shave your face/neck.  Please follow these instructions carefully:  1.  Shower with CHG Soap the night before surgery ONLY (DO NOT USE THE SOAP THE MORNING OF SURGERY).  2.  If you choose to wash your hair, wash  your hair first as usual with your normal  shampoo.  3.  After you shampoo, rinse your hair and body thoroughly to remove  the shampoo.                             4.  Use CHG as you would any other liquid soap.  You can apply chg directly to the skin and wash.  Gently with a scrungie or clean washcloth.  5.  Apply the CHG Soap to your body ONLY FROM THE NECK DOWN.   Do   not use on face/ open                           Wound or open sores. Avoid contact with eyes, ears mouth and   genitals (private parts).                       Wash face,  Genitals (private parts) with your normal soap.             6.  Wash thoroughly, paying special attention to the area where your    surgery  will be performed.  7.  Thoroughly rinse your body with warm water  from the neck down.  8.  DO NOT shower/wash with your normal soap after using and rinsing off the CHG Soap.                9.  Pat yourself dry with a clean towel.            10.  Wear clean pajamas.            11.  Place clean sheets on your bed the night of your first shower and do not  sleep with pets. Day of Surgery : Do not apply any CHG, lotions/deodorants the morning of surgery.  Please wear clean clothes to the hospital/surgery center.  FAILURE TO FOLLOW THESE INSTRUCTIONS MAY RESULT IN THE CANCELLATION OF YOUR SURGERY  PATIENT SIGNATURE_________________________________  NURSE SIGNATURE__________________________________  ________________________________________________________________________

## 2024-02-05 ENCOUNTER — Other Ambulatory Visit: Payer: Self-pay

## 2024-02-05 ENCOUNTER — Encounter (HOSPITAL_COMMUNITY): Payer: Self-pay

## 2024-02-05 ENCOUNTER — Encounter (HOSPITAL_COMMUNITY): Payer: Self-pay | Admitting: Medical

## 2024-02-05 ENCOUNTER — Encounter (HOSPITAL_COMMUNITY)
Admission: RE | Admit: 2024-02-05 | Discharge: 2024-02-05 | Disposition: A | Discharge status: Home / Self Care | Source: Ambulatory Visit | Attending: Orthopedic Surgery | Admitting: Orthopedic Surgery

## 2024-02-05 VITALS — BP 143/103 | HR 84 | Temp 98.9°F | Resp 16 | Ht 64.5 in

## 2024-02-05 DIAGNOSIS — Z01818 Encounter for other preprocedural examination: Secondary | ICD-10-CM | POA: Insufficient documentation

## 2024-02-05 DIAGNOSIS — Z01812 Encounter for preprocedural laboratory examination: Secondary | ICD-10-CM | POA: Diagnosis present

## 2024-02-05 DIAGNOSIS — Z01810 Encounter for preprocedural cardiovascular examination: Secondary | ICD-10-CM | POA: Diagnosis present

## 2024-02-05 LAB — CBC
HCT: 36 % (ref 36.0–46.0)
Hemoglobin: 11.3 g/dL — ABNORMAL LOW (ref 12.0–15.0)
MCH: 28.1 pg (ref 26.0–34.0)
MCHC: 31.4 g/dL (ref 30.0–36.0)
MCV: 89.6 fL (ref 80.0–100.0)
Platelets: 178 K/uL (ref 150–400)
RBC: 4.02 MIL/uL (ref 3.87–5.11)
RDW: 13.6 % (ref 11.5–15.5)
WBC: 5.3 K/uL (ref 4.0–10.5)
nRBC: 0 % (ref 0.0–0.2)

## 2024-02-05 LAB — BASIC METABOLIC PANEL WITH GFR
Anion gap: 13 (ref 5–15)
BUN: 19 mg/dL (ref 6–20)
CO2: 23 mmol/L (ref 22–32)
Calcium: 9.2 mg/dL (ref 8.9–10.3)
Chloride: 104 mmol/L (ref 98–111)
Creatinine, Ser: 0.86 mg/dL (ref 0.44–1.00)
GFR, Estimated: >60 mL/min (ref >60)
Glucose, Bld: 90 mg/dL (ref 70–99)
Potassium: 3.9 mmol/L (ref 3.5–5.1)
Sodium: 140 mmol/L (ref 135–145)

## 2024-02-12 ENCOUNTER — Encounter (HOSPITAL_COMMUNITY): Admission: RE | Payer: Self-pay | Source: Ambulatory Visit

## 2024-02-12 ENCOUNTER — Ambulatory Visit (HOSPITAL_COMMUNITY): Admission: RE | Admit: 2024-02-12 | Admitting: Orthopedic Surgery

## 2024-02-12 ENCOUNTER — Encounter (HOSPITAL_COMMUNITY): Payer: Self-pay | Admitting: Anesthesiology

## 2024-02-12 SURGERY — ARTHRODESIS ANKLE
Anesthesia: General | Site: Ankle | Laterality: Left

## 2024-03-24 ENCOUNTER — Ambulatory Visit: Admitting: Family Medicine

## 2024-03-24 VITALS — BP 118/74 | HR 84 | Ht 64.5 in | Wt 191.8 lb

## 2024-03-24 DIAGNOSIS — I10 Essential (primary) hypertension: Secondary | ICD-10-CM | POA: Diagnosis present

## 2024-03-24 DIAGNOSIS — W458XXA Other foreign body or object entering through skin, initial encounter: Secondary | ICD-10-CM

## 2024-03-24 MED ORDER — LISINOPRIL-HYDROCHLOROTHIAZIDE 10-12.5 MG PO TABS: 1.0000 | ORAL_TABLET | Freq: Every day | ORAL | 3 refills | Status: AC

## 2024-03-24 NOTE — Patient Instructions (Addendum)
" °  1) For the next week, using gentle soap water  and just rub your earlobe. Don't scrub hard. You then apply a thin layer of neosporin or vaselin to the earlobe after to seal out dirt and germs. - You can cover an earring with rubbing alcohol and poke it through the hole every other day to keep it from closing. If it closed and you feel resistance or pain, then don't push the earring through.  2) Come back in about 1 week if the ear lobe is still bleeding, draining pus, or become painful or swollen.  3) You can take tylenol  as needed for pain if needed. "

## 2024-03-24 NOTE — Progress Notes (Unsigned)
" ° ° °  SUBJECTIVE:   CHIEF COMPLAINT / HPI:   Discussed the use of AI scribe software for clinical note transcription with the patient, who gave verbal consent to proceed.  History of Present Illness Kristin [name] is a 49 year old female who presents with an embedded earring back in her right ear.  Embedded foreign body (right ear) - Embedded earring back in the right ear for approximately 3-4 days - New screw-on earrings, first time wearing this pair - Earring back believed to be tightened excessively, resulting in embedding into the skin - Not a new piercing; ears have been pierced for a long time  Local symptoms (right ear) - Pain localized to the right ear, worsened with attempts at removal - Bleeding from the site after unsuccessful self-removal attempt last night    PERTINENT  PMH / PSH: no pertinent history  OBJECTIVE:   BP 118/74   Pulse 84   Ht 5' 4.5 (1.638 m)   Wt 191 lb 12.8 oz (87 kg)   SpO2 97%   BMI 32.41 kg/m   General: Alert, pleasant woman. NAD. HEENT: NCAT. MMM. Earring in R ear with backing tightened and imbedded into back of earlobe. No surrounding erythema, discharge, or bleeding. CV: RRR, no murmurs. Cap refill <2. Resp: CTAB, no wheezing or crackles. Normal WOB on RA.  Abm: Soft, nontender, nondistended. BS present. Ext: Moves all ext spontaneously Skin: Warm, well perfused   ASSESSMENT/PLAN:   Assessment & Plan Other foreign body or object entering through skin, initial encounter No signs of infection. R ear lobe was cleaned, and ~0.5 cc of lidocaine  was injected. Tweezers were used to remove earring backing. Wound had minimal bleeding and quickly stopped with pressure. Vaseline and bandage applied. - Counseled on wound care and return precautions     Twyla Nearing, MD [facility] "

## 2024-03-26 ENCOUNTER — Encounter (HOSPITAL_COMMUNITY): Payer: Self-pay

## 2024-03-26 NOTE — Patient Instructions (Addendum)
 SURGICAL WAITING ROOM VISITATION  Patients having surgery or a procedure may have no more than 2 support people in the waiting area - these visitors may rotate.    Children ages 22 and under will not be able to visit patients in [facility] under most circumstances.   Visitors with respiratory illnesses are discouraged from visiting and should remain at home.  If the patient needs to stay at the hospital during part of their recovery, the visitor guidelines for inpatient rooms apply. Pre-op nurse will coordinate an appropriate time for 1 support person to accompany patient in pre-op.  This support person may not rotate.    Please refer to the [facility] website for the visitor guidelines for Inpatients (after your surgery is over and you are in a regular room).                                                SURGICAL WAITING ROOM VISITATION  Patients having surgery or a procedure may have no more than 2 support people in the waiting area - these visitors may rotate.    Children ages 9 and under will not be able to visit patients in [facility] under most circumstances.   Visitors with respiratory illnesses are discouraged from visiting and should remain at home.  If the patient needs to stay at the hospital during part of their recovery, the visitor guidelines for inpatient rooms apply. Pre-op nurse will coordinate an appropriate time for 1 support person to accompany patient in pre-op.  This support person may not rotate.    Please refer to the [facility] website for the visitor guidelines for Inpatients (after your surgery is over and you are in a regular room).       Your procedure is scheduled on: 04-03-24    Report to [facility] Main Entrance    Report to admitting at      0515  AM   Call this number if you have problems the morning of surgery [phone]   Do not eat food :After Midnight.   After Midnight you may have the following  liquids until _0430_____ AM/ DAY OF SURGERY  then nothing by mouth  Water  Non-Citrus Juices (without pulp, NO RED-Apple, White grape, White cranberry) Black Coffee (NO MILK/CREAM OR CREAMERS, sugar ok)  Clear Tea (NO MILK/CREAM OR CREAMERS, sugar ok) regular and decaf                             Plain Jell-O (NO RED)                                           Fruit ices (not with fruit pulp, NO RED)                                     Popsicles (NO RED)  Sports drinks like Gatorade (NO RED)              .        The day of surgery:  Drink ONE (1) Pre-Surgery Clear Ensure  BY    0430  AM the morning of surgery. Drink in one sitting. Do not sip.  This drink was given to you during your hospital  pre-op appointment visit. Nothing else to drink after completing the  Pre-Surgery Clear Ensure .          If you have questions, please contact your surgeons office.   FOLLOW  ANY ADDITIONAL PRE OP INSTRUCTIONS YOU RECEIVED FROM YOUR SURGEON'S OFFICE!!!     Oral Hygiene is also important to reduce your risk of infection.                                    Remember - BRUSH YOUR TEETH THE MORNING OF SURGERY WITH YOUR REGULAR TOOTHPASTE  DENTURES WILL BE REMOVED PRIOR TO SURGERY PLEASE DO NOT APPLY Poly grip OR ADHESIVES!!!   Do NOT smoke after Midnight   Stop all vitamins and herbal supplements 7 days before surgery.   Take these medicines the morning of surgery with A SIP OF WATER : gabapentin                         You may not have any metal on your body including hair pins, jewelry, and body piercing             Do not wear make-up, lotions, powders, perfumes/ or deodorant  Do not wear nail polish including gel and S&S, artificial/acrylic nails, or any other type of covering on natural nails including finger and toenails. If you have artificial nails, gel coating, etc. that needs to be removed by a nail salon please have  this removed prior to surgery or surgery may need to be canceled/ delayed if the surgeon/ anesthesia feels like they are unable to be safely monitored.   Do not shave  48 hours prior to surgery.            Do not bring valuables to the hospital. [facility] IS NOT             RESPONSIBLE   FOR VALUABLES.   Contacts, glasses, dentures or bridgework may not be worn into surgery.   Bring small overnight bag day of surgery.   DO NOT BRING YOUR HOME MEDICATIONS TO THE HOSPITAL. PHARMACY WILL DISPENSE MEDICATIONS LISTED ON YOUR MEDICATION LIST TO YOU DURING YOUR ADMISSION IN THE HOSPITAL!    Patients discharged on the day of surgery will not be allowed to drive home.  Someone NEEDS to stay with you for the first 24 hours after anesthesia.   Special Instructions: Bring a copy of your healthcare power of attorney and living will documents the day of surgery if you haven't scanned them before.              Please read over the following fact sheets you were given: IF YOU HAVE QUESTIONS ABOUT YOUR PRE-OP INSTRUCTIONS PLEASE CALL [phone].   If you test positive for Covid or have been in contact with anyone that has tested positive in the last 10 days please notify you surgeon.      [place] - Preparing for Surgery Before surgery, you can play an important role.  Because skin is not sterile, your skin needs to be as free of germs as possible.  You can reduce the number of germs on your skin by washing with CHG (chlorahexidine gluconate) soap before surgery.  CHG is an antiseptic cleaner which kills germs and bonds with the skin to continue killing germs even after washing. Please DO NOT use if you have an allergy to CHG or antibacterial soaps.  If your skin becomes reddened/irritated stop using the CHG and inform your nurse when you arrive at Short Stay. Do not shave (including legs and underarms) for at least 48 hours prior to the first CHG shower.  You may shave your face/neck.  Please follow  these instructions carefully:  1.  Shower with CHG Soap the night before surgery ONLY (DO NOT USE THE SOAP THE MORNING OF SURGERY).  2.  If you choose to wash your hair, wash your hair first as usual with your normal  shampoo.  3.  After you shampoo, rinse your hair and body thoroughly to remove the shampoo.                             4.  Use CHG as you would any other liquid soap.  You can apply chg directly to the skin and wash.  Gently with a scrungie or clean washcloth.  5.  Apply the CHG Soap to your body ONLY FROM THE NECK DOWN.   Do not use on face/ open                           Wound or open sores. Avoid contact with eyes, ears mouth and genitals (private parts).                       Wash face,  Genitals (private parts) with your normal soap.             6.  Wash thoroughly, paying special attention to the area where your  surgery  will be performed.  7.  Thoroughly rinse your body with warm water  from the neck down.  8.  DO NOT shower/wash with your normal soap after using and rinsing off the CHG Soap.                9.  Pat yourself dry with a clean towel.            10.  Wear clean pajamas.            11.  Place clean sheets on your bed the night of your first shower and do not  sleep with pets. Day of Surgery : Do not apply any CHG, lotions/deodorants the morning of surgery.  Please wear clean clothes to the hospital/surgery center.  FAILURE TO FOLLOW THESE INSTRUCTIONS MAY RESULT IN THE CANCELLATION OF YOUR SURGERY  PATIENT SIGNATURE_________________________________  NURSE SIGNATURE__________________________________  ________________________________________________________________________

## 2024-03-26 NOTE — Progress Notes (Addendum)
 PCP - Lauraine Molt LOV 08-24-23 epic [facility]  on [address] Cardiologist - n/a  PPM/ICD -  Device Orders -  Rep Notified -   Chest x-ray - 04-16-23 epic EKG - 02-05-24 epic Stress Test -  ECHO - 04-17-23 epic Cardiac Cath - 2023 epic  Sleep Study - n/a CPAP - n/a  Fasting Blood Sugar - n/a Checks Blood Sugar _n/a____ times a day  Blood Thinner Instructions:n/a Aspirin  Instructions:n/a  ERAS Protcol - PRE-SURGERY Ensure    COVID vaccine -yes  Activity--Able to complete ADL's with no SOB or CP some SOB with walking  not new for pt. For about a year now . Fluctuates with weight gain  Anesthesia review: HTN  Patient denies shortness of breath, fever, cough and chest pain at PAT appointment   All instructions explained to the patient, with a verbal understanding of the material. Patient agrees to go over the instructions while at home for a better understanding. Patient also instructed to self quarantine after being tested for COVID-19. The opportunity to ask questions was provided.

## 2024-03-27 ENCOUNTER — Other Ambulatory Visit: Payer: Self-pay

## 2024-03-27 ENCOUNTER — Encounter (HOSPITAL_COMMUNITY)
Admission: RE | Admit: 2024-03-27 | Discharge: 2024-03-27 | Disposition: A | Discharge status: Home / Self Care | Source: Ambulatory Visit | Attending: Orthopedic Surgery | Admitting: Orthopedic Surgery

## 2024-03-27 ENCOUNTER — Encounter (HOSPITAL_COMMUNITY): Payer: Self-pay

## 2024-03-27 VITALS — BP 146/96 | HR 79 | Temp 98.6°F | Resp 16 | Ht 64.5 in | Wt 181.0 lb

## 2024-03-27 DIAGNOSIS — Z01812 Encounter for preprocedural laboratory examination: Secondary | ICD-10-CM | POA: Insufficient documentation

## 2024-03-27 DIAGNOSIS — Z01818 Encounter for other preprocedural examination: Secondary | ICD-10-CM

## 2024-03-27 LAB — BASIC METABOLIC PANEL WITH GFR
Anion gap: 10 (ref 5–15)
BUN: 12 mg/dL (ref 6–20)
CO2: 24 mmol/L (ref 22–32)
Calcium: 9.3 mg/dL (ref 8.9–10.3)
Chloride: 104 mmol/L (ref 98–111)
Creatinine, Ser: 0.8 mg/dL (ref 0.44–1.00)
GFR, Estimated: >60 mL/min
Glucose, Bld: 96 mg/dL (ref 70–99)
Potassium: 4.3 mmol/L (ref 3.5–5.1)
Sodium: 138 mmol/L (ref 135–145)

## 2024-03-27 LAB — CBC
HCT: 39.9 % (ref 36.0–46.0)
Hemoglobin: 13 g/dL (ref 12.0–15.0)
MCH: 28.3 pg (ref 26.0–34.0)
MCHC: 32.6 g/dL (ref 30.0–36.0)
MCV: 86.7 fL (ref 80.0–100.0)
Platelets: 371 K/uL (ref 150–400)
RBC: 4.6 MIL/uL (ref 3.87–5.11)
RDW: 17.9 % — ABNORMAL HIGH (ref 11.5–15.5)
WBC: 4.5 K/uL (ref 4.0–10.5)
nRBC: 0.4 % — ABNORMAL HIGH (ref 0.0–0.2)

## 2024-04-02 ENCOUNTER — Inpatient Hospital Stay (HOSPITAL_COMMUNITY)
Admission: EM | Admit: 2024-04-02 | Discharge: 2024-04-16 | DRG: 504 | Disposition: A | Discharge status: Skilled Nursing Facility | Attending: Internal Medicine | Admitting: Internal Medicine

## 2024-04-02 ENCOUNTER — Emergency Department (HOSPITAL_COMMUNITY)

## 2024-04-02 ENCOUNTER — Other Ambulatory Visit: Payer: Self-pay

## 2024-04-02 ENCOUNTER — Inpatient Hospital Stay (HOSPITAL_COMMUNITY)

## 2024-04-02 ENCOUNTER — Encounter (HOSPITAL_COMMUNITY): Payer: Self-pay | Admitting: Internal Medicine

## 2024-04-02 DIAGNOSIS — F17210 Nicotine dependence, cigarettes, uncomplicated: Secondary | ICD-10-CM | POA: Diagnosis present

## 2024-04-02 DIAGNOSIS — I10 Essential (primary) hypertension: Secondary | ICD-10-CM | POA: Diagnosis present

## 2024-04-02 DIAGNOSIS — Z8679 Personal history of other diseases of the circulatory system: Secondary | ICD-10-CM

## 2024-04-02 DIAGNOSIS — L299 Pruritus, unspecified: Secondary | ICD-10-CM | POA: Diagnosis present

## 2024-04-02 DIAGNOSIS — Z720 Tobacco use: Secondary | ICD-10-CM | POA: Diagnosis not present

## 2024-04-02 DIAGNOSIS — W010XXA Fall on same level from slipping, tripping and stumbling without subsequent striking against object, initial encounter: Secondary | ICD-10-CM | POA: Diagnosis present

## 2024-04-02 DIAGNOSIS — Z9889 Other specified postprocedural states: Secondary | ICD-10-CM

## 2024-04-02 DIAGNOSIS — S92331A Displaced fracture of third metatarsal bone, right foot, initial encounter for closed fracture: Secondary | ICD-10-CM | POA: Diagnosis present

## 2024-04-02 DIAGNOSIS — Z6831 Body mass index (BMI) 31.0-31.9, adult: Secondary | ICD-10-CM

## 2024-04-02 DIAGNOSIS — M25571 Pain in right ankle and joints of right foot: Secondary | ICD-10-CM

## 2024-04-02 DIAGNOSIS — Y92008 Other place in unspecified non-institutional (private) residence as the place of occurrence of the external cause: Secondary | ICD-10-CM

## 2024-04-02 DIAGNOSIS — Z8249 Family history of ischemic heart disease and other diseases of the circulatory system: Secondary | ICD-10-CM

## 2024-04-02 DIAGNOSIS — K219 Gastro-esophageal reflux disease without esophagitis: Secondary | ICD-10-CM | POA: Diagnosis present

## 2024-04-02 DIAGNOSIS — S92321A Displaced fracture of second metatarsal bone, right foot, initial encounter for closed fracture: Secondary | ICD-10-CM | POA: Diagnosis present

## 2024-04-02 DIAGNOSIS — S93324A Dislocation of tarsometatarsal joint of right foot, initial encounter: Secondary | ICD-10-CM

## 2024-04-02 DIAGNOSIS — Z01818 Encounter for other preprocedural examination: Secondary | ICD-10-CM

## 2024-04-02 DIAGNOSIS — F319 Bipolar disorder, unspecified: Secondary | ICD-10-CM | POA: Diagnosis present

## 2024-04-02 DIAGNOSIS — Z800 Family history of malignant neoplasm of digestive organs: Secondary | ICD-10-CM

## 2024-04-02 DIAGNOSIS — S82891A Other fracture of right lower leg, initial encounter for closed fracture: Secondary | ICD-10-CM | POA: Diagnosis not present

## 2024-04-02 DIAGNOSIS — Z79899 Other long term (current) drug therapy: Secondary | ICD-10-CM

## 2024-04-02 DIAGNOSIS — N390 Urinary tract infection, site not specified: Secondary | ICD-10-CM | POA: Diagnosis present

## 2024-04-02 DIAGNOSIS — F419 Anxiety disorder, unspecified: Secondary | ICD-10-CM | POA: Diagnosis present

## 2024-04-02 DIAGNOSIS — Z8781 Personal history of (healed) traumatic fracture: Secondary | ICD-10-CM

## 2024-04-02 DIAGNOSIS — Z888 Allergy status to other drugs, medicaments and biological substances status: Secondary | ICD-10-CM

## 2024-04-02 DIAGNOSIS — Z91018 Allergy to other foods: Secondary | ICD-10-CM

## 2024-04-02 DIAGNOSIS — S8261XA Displaced fracture of lateral malleolus of right fibula, initial encounter for closed fracture: Principal | ICD-10-CM | POA: Diagnosis present

## 2024-04-02 DIAGNOSIS — E66811 Obesity, class 1: Secondary | ICD-10-CM | POA: Diagnosis present

## 2024-04-02 DIAGNOSIS — I429 Cardiomyopathy, unspecified: Secondary | ICD-10-CM | POA: Diagnosis present

## 2024-04-02 DIAGNOSIS — Z803 Family history of malignant neoplasm of breast: Secondary | ICD-10-CM

## 2024-04-02 DIAGNOSIS — E6609 Other obesity due to excess calories: Secondary | ICD-10-CM

## 2024-04-02 DIAGNOSIS — Z818 Family history of other mental and behavioral disorders: Secondary | ICD-10-CM

## 2024-04-02 LAB — CBC WITH DIFFERENTIAL/PLATELET
Abs Immature Granulocytes: 0.02 K/uL (ref 0.00–0.07)
Basophils Absolute: 0.1 K/uL (ref 0.0–0.1)
Basophils Relative: 1 %
Eosinophils Absolute: 0.3 K/uL (ref 0.0–0.5)
Eosinophils Relative: 3 %
HCT: 38.7 % (ref 36.0–46.0)
Hemoglobin: 12.8 g/dL (ref 12.0–15.0)
Immature Granulocytes: 0 %
Lymphocytes Relative: 27 %
Lymphs Abs: 2.1 K/uL (ref 0.7–4.0)
MCH: 28.6 pg (ref 26.0–34.0)
MCHC: 33.1 g/dL (ref 30.0–36.0)
MCV: 86.4 fL (ref 80.0–100.0)
Monocytes Absolute: 0.8 K/uL (ref 0.1–1.0)
Monocytes Relative: 10 %
Neutro Abs: 4.5 K/uL (ref 1.7–7.7)
Neutrophils Relative %: 59 %
Platelets: 164 K/uL (ref 150–400)
RBC: 4.48 MIL/uL (ref 3.87–5.11)
RDW: 18.1 % — ABNORMAL HIGH (ref 11.5–15.5)
WBC: 7.8 K/uL (ref 4.0–10.5)
nRBC: 0 % (ref 0.0–0.2)

## 2024-04-02 LAB — BASIC METABOLIC PANEL WITH GFR
Anion gap: 14 (ref 5–15)
BUN: 15 mg/dL (ref 6–20)
CO2: 22 mmol/L (ref 22–32)
Calcium: 8.2 mg/dL — ABNORMAL LOW (ref 8.9–10.3)
Chloride: 102 mmol/L (ref 98–111)
Creatinine, Ser: 1.05 mg/dL — ABNORMAL HIGH (ref 0.44–1.00)
GFR, Estimated: >60 mL/min
Glucose, Bld: 77 mg/dL (ref 70–99)
Potassium: 3.6 mmol/L (ref 3.5–5.1)
Sodium: 138 mmol/L (ref 135–145)

## 2024-04-02 MED ORDER — SODIUM CHLORIDE 0.9 % IV SOLN: INTRAVENOUS | Status: DC

## 2024-04-02 MED ORDER — LISINOPRIL 10 MG PO TABS
10.0000 mg | ORAL_TABLET | Freq: Every day | ORAL | Status: DC
  Administered 2024-04-04 – 2024-04-16 (×13): 10 mg via ORAL
  Filled 2024-04-02 (×14): qty 1

## 2024-04-02 MED ORDER — OXYCODONE HCL 5 MG PO TABS
5.0000 mg | ORAL_TABLET | ORAL | Status: DC | PRN
  Administered 2024-04-02: 5 mg via ORAL
  Filled 2024-04-02: qty 1

## 2024-04-02 MED ORDER — OXYCODONE HCL 5 MG PO TABS
5.0000 mg | ORAL_TABLET | Freq: Once | ORAL | Status: AC
  Administered 2024-04-02: 5 mg via ORAL
  Filled 2024-04-02: qty 1

## 2024-04-02 MED ORDER — DIPHENHYDRAMINE HCL 25 MG PO CAPS
25.0000 mg | ORAL_CAPSULE | Freq: Four times a day (QID) | ORAL | Status: DC | PRN
  Administered 2024-04-02 (×2): 25 mg via ORAL
  Filled 2024-04-02 (×2): qty 1

## 2024-04-02 MED ORDER — HYDROXYZINE HCL 25 MG PO TABS
25.0000 mg | ORAL_TABLET | Freq: Three times a day (TID) | ORAL | Status: DC | PRN
  Administered 2024-04-02 – 2024-04-16 (×23): 25 mg via ORAL
  Filled 2024-04-02 (×25): qty 1

## 2024-04-02 MED ORDER — METHOCARBAMOL 500 MG PO TABS
500.0000 mg | ORAL_TABLET | Freq: Four times a day (QID) | ORAL | Status: DC | PRN
  Administered 2024-04-02: 500 mg via ORAL
  Filled 2024-04-02: qty 1

## 2024-04-02 MED ORDER — LORAZEPAM 2 MG/ML IJ SOLN
0.5000 mg | Freq: Four times a day (QID) | INTRAMUSCULAR | Status: AC | PRN
  Administered 2024-04-02 – 2024-04-03 (×2): 0.5 mg via INTRAVENOUS
  Filled 2024-04-02 (×2): qty 1

## 2024-04-02 MED ORDER — LORAZEPAM 2 MG/ML IJ SOLN: 0.5000 mg | Freq: Four times a day (QID) | INTRAMUSCULAR | Status: DC | PRN

## 2024-04-02 MED ORDER — HYDRALAZINE HCL 20 MG/ML IJ SOLN: 10.0000 mg | INTRAMUSCULAR | Status: DC | PRN

## 2024-04-02 MED ORDER — ENOXAPARIN SODIUM 40 MG/0.4ML IJ SOSY: 40.0000 mg | PREFILLED_SYRINGE | INTRAMUSCULAR | Status: DC

## 2024-04-02 MED ORDER — MORPHINE SULFATE (PF) 2 MG/ML IV SOLN
2.0000 mg | INTRAVENOUS | Status: DC | PRN
  Administered 2024-04-02 – 2024-04-03 (×2): 2 mg via INTRAVENOUS
  Filled 2024-04-02 (×2): qty 1

## 2024-04-02 MED ORDER — HYDROCHLOROTHIAZIDE 12.5 MG PO TABS
12.5000 mg | ORAL_TABLET | Freq: Every day | ORAL | Status: DC
  Administered 2024-04-04 – 2024-04-16 (×13): 12.5 mg via ORAL
  Filled 2024-04-02 (×14): qty 1

## 2024-04-02 MED ORDER — LISINOPRIL-HYDROCHLOROTHIAZIDE 10-12.5 MG PO TABS: 1.0000 | ORAL_TABLET | Freq: Every day | ORAL | Status: DC

## 2024-04-02 MED ORDER — SODIUM CHLORIDE 0.9 % IV SOLN: INTRAVENOUS | Status: AC

## 2024-04-02 NOTE — Progress Notes (Signed)
 Orthopedic Tech Progress Note Patient Details:  Kristin [name] 1976-01-09 [id number]  Ortho Devices Type of Ortho Device: Ace wrap, Cotton web roll, Short leg splint Ortho Device/Splint Location: RLE Ortho Device/Splint Interventions: Application, Ordered, Adjustment   Post Interventions Patient Tolerated: Well Instructions Provided: Care of device  Kristin [name] Pac 04/02/2024, 9:59 AM

## 2024-04-02 NOTE — ED Provider Notes (Signed)
 " [place] EMERGENCY DEPARTMENT AT [facility] Provider Note   CSN: [id number] Arrival date & time: 04/02/24  9347     Patient presents with: Right Foot Injury   Kristin [name] is a 49 y.o. female.   Right foot pain after fall last night, recently injured left ankle. Patient denies weakness numbness tingling, nothing makes it worse or better. Hx of depression, anxiety, ACL tear, bipolar. Patient denies pain or injury elsewhere.  Patient supposed to have surgery tomorrow on her left ankle to redo fracture there.  Now having pain in the right ankle since she slipped yesterday.  Pain throughout the ankle and foot.  The history is provided by the patient.       Prior to Admission medications  Medication Sig Start Date End Date Taking? Authorizing Provider  oxyCODONE -acetaminophen  (PERCOCET) 5-325 MG tablet Take 1 tablet by mouth every 4 (four) hours as needed for moderate pain (pain score 4-6). 01/28/24  Yes [provider]  acetaminophen  (TYLENOL ) 500 MG tablet Take 1,000 mg by mouth every 6 (six) hours as needed for moderate pain (pain score 4-6).    [provider]  acetaminophen  (TYLENOL ) 650 MG CR tablet Take 1,300 mg by mouth every 8 (eight) hours as needed for pain.    [provider]  cyclobenzaprine  (FLEXERIL ) 5 MG tablet Take 5 mg by mouth 3 (three) times daily as needed for muscle spasms. 12/03/23   [provider]  docusate sodium  (COLACE) 100 MG capsule Take 1 capsule (100 mg total) by mouth 2 (two) times daily. While taking narcotic pain medicine. Patient taking differently: Take 100 mg by mouth daily as needed for moderate constipation. While taking narcotic pain medicine. 12/19/23   Aniceto Eva Grebe, PA-C  gabapentin  (NEURONTIN ) 300 MG capsule TAKE 1 CAPSULE BY MOUTH EVERY MORNING AND TAKE 2 CAPSULES BY MOUTH AT BEDTIME Patient taking differently: Take 600 mg by mouth 3 (three) times daily. 10/10/23   Rosendo Norleen BROCKS, MD   ibuprofen  (ADVIL ) 200 MG tablet Take 800 mg by mouth every 8 (eight) hours as needed for moderate pain (pain score 4-6).    [provider]  lisinopril -hydrochlorothiazide  (ZESTORETIC ) 10-12.5 MG tablet Take 1 tablet by mouth daily. 03/24/24   Elicia Hamlet, MD  meloxicam  (MOBIC ) 15 MG tablet TAKE 1 TABLET BY MOUTH EVERY DAY WITH A MEAL    [provider]  methocarbamol  (ROBAXIN ) 500 MG tablet Take 1 tablet (500 mg total) by mouth every 6 (six) hours as needed for muscle spasms. 12/21/23   Aniceto Eva Grebe, PA-C  Multiple Vitamin (MULTIVITAMIN) tablet Take 1 tablet by mouth daily.    [provider]  senna (SENOKOT) 8.6 MG TABS tablet Take 2 tablets (17.2 mg total) by mouth 2 (two) times daily. 12/19/23   Aniceto Eva Grebe, PA-C  fluticasone  (FLONASE ) 50 MCG/ACT nasal spray Place 2 sprays into both nostrils daily. Patient taking differently: Place 2 sprays into both nostrils daily as needed for allergies.  02/25/17 04/14/19  Babara Greig GAILS, PA-C  promethazine  (PHENERGAN ) 25 MG tablet Take 1 tablet (25 mg total) by mouth every 6 (six) hours as needed for nausea or vomiting. 04/17/18 08/29/18  Dean Clarity, MD    Allergies: Imitrex [sumatriptan] and Banana    Review of Systems  Updated Vital Signs BP (!) 141/90   Pulse 86   Temp 98.1 F (36.7 C)   Resp 16   LMP  (LMP Unknown)   SpO2 100%   Physical Exam  Vitals and nursing note reviewed.  Constitutional:      General: She is not in acute distress.    Appearance: She is well-developed.  HENT:     Head: Normocephalic and atraumatic.  Eyes:     Conjunctiva/sclera: Conjunctivae normal.  Cardiovascular:     Rate and Rhythm: Normal rate and regular rhythm.     Pulses: Normal pulses.     Heart sounds: No murmur heard. Pulmonary:     Effort: Pulmonary effort is normal. No respiratory distress.     Breath sounds: Normal breath sounds.  Abdominal:     Palpations: Abdomen is soft.     Tenderness: There is no  abdominal tenderness.  Musculoskeletal:        General: Tenderness (right ankle) present. No swelling.     Cervical back: Neck supple.  Skin:    General: Skin is warm and dry.     Capillary Refill: Capillary refill takes less than 2 seconds.  Neurological:     General: No focal deficit present.     Mental Status: She is alert.     Sensory: No sensory deficit.     Motor: No weakness.  Psychiatric:        Mood and Affect: Mood normal.     (all labs ordered are listed, but only abnormal results are displayed) Labs Reviewed  CBC WITH DIFFERENTIAL/PLATELET  BASIC METABOLIC PANEL WITH GFR    EKG: None  Radiology: DG Ankle Right Port Result Date: 04/02/2024 CLINICAL DATA:  Trauma to the right ankle. EXAM: PORTABLE RIGHT ANKLE - 2 VIEW COMPARISON:  Right foot radiograph dated 04/03/2023. FINDINGS: Mildly displaced fracture of the lateral malleolus with approximately 5 mm lateral displacement of the distal fracture fragment. Fracture of the medial base of the second metatarsal with widening of the first intermetatarsal space in keeping with Lisfranc injury. Additional fracture involving the base of the third metatarsal as well as lateral corner of the cuboid suspected. Further evaluation with CT is recommended. The bones are osteopenic. There is soft tissue swelling over the lateral malleolus. No radiopaque foreign object or soft tissue gas. IMPRESSION: 1. Mildly displaced fracture of the lateral malleolus. 2. Fracture of the medial base of the second metatarsal with widening of the first intermetatarsal space in keeping with Lisfranc injury. 3. Additional fracture involving the base of the third metatarsal as well as lateral corner of the cuboid suspected. Electronically Signed   By: Vanetta Chou M.D.   On: 04/02/2024 08:02   DG Foot Complete Right Result Date: 04/02/2024 CLINICAL DATA:  Right foot injury with pain. EXAM: RIGHT FOOT COMPLETE - 3+ VIEW COMPARISON:  None Available. FINDINGS:  There is an acute corner fracture from the medial base of the second toe metatarsal involving the articular surface. Subtle lucency noted at the medial base of the third metatarsal but no discrete fracture fragment can be identified. No other evidence for an acute fracture. No dislocation. IMPRESSION: 1. Acute corner fracture from the medial base of the second toe metatarsal involving the articular surface. 2. Subtle lucency at the medial base of the third metatarsal without discrete fracture fragment. CT imaging may prove helpful to further evaluate as clinically warranted. Electronically Signed   By: Camellia Candle M.D.   On: 04/02/2024 07:29     Procedures   Medications Ordered in the ED  oxyCODONE  (Oxy IR/ROXICODONE ) immediate release tablet 5 mg (has no administration in time range)  morphine  (PF) 2 MG/ML injection 2 mg (has no administration  in time range)  enoxaparin  (LOVENOX ) injection 40 mg (has no administration in time range)  diphenhydrAMINE  (BENADRYL ) capsule 25 mg (has no administration in time range)  LORazepam  (ATIVAN ) injection 0.5 mg (has no administration in time range)  oxyCODONE  (Oxy IR/ROXICODONE ) immediate release tablet 5 mg (5 mg Oral Given 04/02/24 0739)                                    Medical Decision Making Amount and/or Complexity of Data Reviewed Labs: ordered. Radiology: ordered.  Risk Prescription drug management. Decision regarding hospitalization.   Doyce CROME Enterline is here with right ankle and foot pain.  Sounds like she sprained it last night.  Having difficulty putting weight on it.  She is also supposed to have her left ankle surgery tomorrow to redo ankle fracture she had here recently.  She is neurovascular neuromuscular intact on exam.  She is tender throughout the right ankle and right foot but no obvious deformity.  Will get x-rays of the right ankle and right foot will give her a dose of pain medicine and reevaluate.  Per the report there is a  mildly displaced fracture of the lateral malleolus but also fracture of the medial base of the second metatarsal with widening of the first intermetatarsal space in keeping with a Lisfranc injury and there is also likely a fracture involving the base of the third metatarsal as well as the lateral corner of the cuboid.  Ultimately given that she is post to have surgery on her left ankle tomorrow and likely to be nonweightbearing after that surgery and now seemingly being able to not bear much weight on her right lower extremity I talked with Ozell Purchase with orthopedics.  She is scheduled for the OR at [facility] tomorrow with Dr. Kit.  The plan will be to place her in a short leg splint to get a CT of the right foot and ankle for further evaluation of fractures and admit her to [facility] for surgery in the morning and she will likely need placement given the likelihood of nonweightbearing to both lower extremities.  I talked with Dr. Claudene regarding admission.  He is in a reach out to orthopedic team to figure out logistics as well.  This chart was dictated using voice recognition software.  Despite best efforts to proofread,  errors can occur which can change the documentation meaning.      Final diagnoses:  Closed fracture of right ankle, initial encounter  Acute right ankle pain    ED Discharge Orders     None          Ruthe Cornet, DO 04/02/24 9074  "

## 2024-04-02 NOTE — Progress Notes (Signed)
 Pt known to me for previous ankle injury and surgery.  She is scheduled for OR tomorrow but fell yesterday.  I reviewed her CT Scan.  She has a displaced lisfranc injury.  I'll change her surgical posting to fix the injured foot first.  We'll defer treatment on the previous ankle injury for the time being.  Full consult note to follow.  No blood thinners pending surgery tomorrow.  NPO after midnight.

## 2024-04-02 NOTE — H&P (Signed)
 " History and Physical    [name] FMW:[id number] DOB: 26-Mar-1975 DOA: 04/02/2024 DOS: the patient was seen and examined on 04/02/2024 PCP: Toma Matas, MD  Patient coming from: Home  Chief Complaint:  Chief Complaint  Patient presents with   Right Foot Injury   HPI: Kristin [name] is a 49 y.o. female with medical history significant of hypertension,  previous Takotsubo cardiomyopathy, bipolar disorder, and prior left ankle fracture s/p ORIF presents after having a fall with right ankle pain.  She slipped and fell last night, resulting in right ankle pain. She required assistance to get up and immediately noticed the pain. No head injury was mentioned during the fall.  She is currently taking medications for blood pressure, oxycodone  for pain related to a previous ankle injury, and gabapentin . The previous left ankle injury occurred in September with subsequent open reduction internal fixation by Dr. Kit performed on 12/18/2023., and she is scheduled for redo surgery on Thursday as she reports that it was not healing properly.  She smokes a pack of cigarettes every three to four days and does not use drugs.  In the emergency department patient was noted to be afebrile with blood pressures elevated up to 141/90, and O2 saturations maintained on room air.  X-rays of the right ankle revealed minimally displaced fracture of the lateral malleolus, fracture of the medial base of the second metatarsal with widening of the first intermetatarsal space, no additional fracture involving the base of the third metatarsal along with the lateral corner of the cuboid suspected.  Orthopedics was consulted and requested admission at [facility].  Patient had been given oxycodone  for pain, ice  applied to the area, and it was to be splinted.  CT scan for further evaluation were ordered.  Review of Systems: As mentioned in the history of present illness. All other systems reviewed and are  negative. Past Medical History:  Diagnosis Date   Acid reflux    ACL tear 2011   not sure which side   Acute lateral meniscus tear of right knee    Anemia    Anxiety    Arthritis    Back pain    slipped disc lower back lumbar   Bipolar 1 disorder (HCC)    Depression    Dysfunctional uterine bleeding    Eczema    Frequent UTI    History of blood transfusion    Hypertension    Insomnia    Migraine headache    Neuromuscular disorder (HCC)    leg cramps, neuropathy feet   Osteoarthritis of right knee 12/21/2014   ACL deficit since 2011   Pyelonephritis 10/19/2022   Respiratory distress 01/12/2022   Sepsis with acute hypoxic respiratory failure without septic shock, due to unspecified organism (HCC) 01/06/2022   Past Surgical History:  Procedure Laterality Date   BIOPSY  09/19/2017   Procedure: BIOPSY;  Surgeon: Elicia Claw, MD;  Location: [facility] ENDOSCOPY;  Service: Gastroenterology;;   BIOPSY  10/15/2019   Procedure: BIOPSY;  Surgeon: Dianna Specking, MD;  Location: [facility] ENDOSCOPY;  Service: Endoscopy;;   BIOPSY  04/19/2023   Procedure: BIOPSY;  Surgeon: Suzann Inocente HERO, MD;  Location: [facility] ENDOSCOPY;  Service: Gastroenterology;;   COLONOSCOPY N/A 10/15/2019   Procedure: COLONOSCOPY;  Surgeon: Dianna Specking, MD;  Location: [facility] ENDOSCOPY;  Service: Endoscopy;  Laterality: N/A;   DILITATION & CURRETTAGE/HYSTROSCOPY WITH HYDROTHERMAL ABLATION N/A 04/04/2017   Procedure: DILATATION & CURETTAGE/HYSTEROSCOPY WITH HYDROTHERMAL ABLATION;  Surgeon: Fredirick Glenys RAMAN,  MD;  Location: [facility];  Service: Gynecology;  Laterality: N/A;   ESOPHAGOGASTRODUODENOSCOPY N/A 10/15/2019   Procedure: ESOPHAGOGASTRODUODENOSCOPY (EGD);  Surgeon: Dianna Specking, MD;  Location: [facility];  Service: Endoscopy;  Laterality: N/A;   ESOPHAGOGASTRODUODENOSCOPY (EGD) WITH PROPOFOL  N/A 09/19/2017   Procedure: ESOPHAGOGASTRODUODENOSCOPY (EGD) WITH PROPOFOL ;  Surgeon: Elicia Claw,  MD;  Location: [facility] ENDOS[facility];  Service: Gastroenterology;  Laterality: N/A;   ESOPHAGOGASTRODUODENOSCOPY (EGD) WITH PROPOFOL  N/A 04/19/2023   Procedure: ESOPHAGOGASTRODUODENOSCOPY (EGD) WITH PROPOFOL ;  Surgeon: Suzann Inocente HERO, MD;  Location: [facility] ENDOSCOPY;  Service: Gastroenterology;  Laterality: N/A;   KNEE ARTHROSCOPY WITH LATERAL MENISECTOMY Right 09/30/2019   Procedure: KNEE ARTHROSCOPY WITH LATERAL MENISECTOMY;  Surgeon: Beverley Evalene BIRCH, MD;  Location: [facility];  Service: Orthopedics;  Laterality: Right;   LIGAMENT REPAIR Left 12/18/2023   Procedure: REPAIR, LIGAMENT;  Surgeon: Kit Rush, MD;  Location: [facility]S;  Service: Orthopedics;  Laterality: Left;   ORIF ANKLE FRACTURE Left 12/18/2023   Procedure: OPEN REDUCTION INTERNAL FIXATION (ORIF) ANKLE FRACTURE;  Surgeon: Kit Rush, MD;  Location: [facility] ORS;  Service: Orthopedics;  Laterality: Left;   RIGHT/LEFT HEART CATH AND CORONARY ANGIOGRAPHY N/A 01/09/2022   Procedure: RIGHT/LEFT HEART CATH AND CORONARY ANGIOGRAPHY;  Surgeon: Elmira Newman PARAS, MD;  Location: [facility];  Service: Cardiovascular;  Laterality: N/A;   SYNDESMOSIS REPAIR Left 12/18/2023   Procedure: REPAIR, SYNDESMOSIS, ANKLE;  Surgeon: Kit Rush, MD;  Location: [facility];  Service: Orthopedics;  Laterality: Left;   TUBAL LIGATION  yrs ago   Social History:  reports that she has been smoking cigarettes. She has a 1.5 pack-year smoking history. She has been exposed to tobacco smoke. She has never used smokeless tobacco. She reports current alcohol use. She reports that she does not use drugs.  Allergies[1]  Family History  Problem Relation Age of Onset   Breast cancer Maternal Grandmother    Hypertension Mother    Hypertension Brother    Colon cancer Maternal Uncle    Bipolar disorder Cousin    Schizophrenia Cousin     Prior to Admission medications  Medication Sig Start Date End Date Taking? Authorizing Provider  acetaminophen   (TYLENOL ) 500 MG tablet Take 1,000 mg by mouth every 6 (six) hours as needed for moderate pain (pain score 4-6).    [provider]  acetaminophen  (TYLENOL ) 650 MG CR tablet Take 1,300 mg by mouth every 8 (eight) hours as needed for pain.    [provider]  cyclobenzaprine  (FLEXERIL ) 5 MG tablet Take 5 mg by mouth 3 (three) times daily as needed for muscle spasms. 12/03/23   [provider]  docusate sodium  (COLACE) 100 MG capsule Take 1 capsule (100 mg total) by mouth 2 (two) times daily. While taking narcotic pain medicine. Patient taking differently: Take 100 mg by mouth daily as needed for moderate constipation. While taking narcotic pain medicine. 12/19/23   Aniceto Eva Grebe, PA-C  gabapentin  (NEURONTIN ) 300 MG capsule TAKE 1 CAPSULE BY MOUTH EVERY MORNING AND TAKE 2 CAPSULES BY MOUTH AT BEDTIME Patient taking differently: Take 600 mg by mouth 3 (three) times daily. 10/10/23   Rosendo Rush BROCKS, MD  ibuprofen  (ADVIL ) 200 MG tablet Take 800 mg by mouth every 8 (eight) hours as needed for moderate pain (pain score 4-6).    [provider]  lisinopril -hydrochlorothiazide  (ZESTORETIC ) 10-12.5 MG tablet Take 1 tablet by mouth daily. 03/24/24   Elicia Hamlet, MD  methocarbamol  (ROBAXIN ) 500 MG tablet Take 1 tablet (  500 mg total) by mouth every 6 (six) hours as needed for muscle spasms. 12/21/23   Aniceto Eva Grebe, PA-C  Multiple Vitamin (MULTIVITAMIN) tablet Take 1 tablet by mouth daily.    [provider]  senna (SENOKOT) 8.6 MG TABS tablet Take 2 tablets (17.2 mg total) by mouth 2 (two) times daily. 12/19/23   Aniceto Eva Grebe, PA-C  fluticasone  (FLONASE ) 50 MCG/ACT nasal spray Place 2 sprays into both nostrils daily. Patient taking differently: Place 2 sprays into both nostrils daily as needed for allergies.  02/25/17 04/14/19  Babara Greig GAILS, PA-C  promethazine  (PHENERGAN ) 25 MG tablet Take 1 tablet (25 mg total) by mouth every 6 (six) hours as needed for nausea  or vomiting. 04/17/18 08/29/18  Dean Clarity, MD    Physical Exam: Vitals:   04/02/24 0656 04/02/24 0700 04/02/24 0704  BP: (!) 141/90    Pulse: (!) 29 85 86  Resp: 16    Temp: 98.1 F (36.7 C)    SpO2: (!) 85% 100% 100%    Constitutional: Middle-aged female NAD, calm, comfortable Eyes: PERRL, lids and conjunctivae normal ENMT: Mucous membranes are moist. Posterior pharynx clear of any exudate or lesions.Normal dentition.  Neck: normal, supple, no masses, no thyromegaly Respiratory: clear to auscultation bilaterally, no wheezing, no crackles. Normal respiratory effort. No accessory muscle use.  Cardiovascular: Regular rate and rhythm, no murmurs / rubs / gallops. No extremity edema. 2+ pedal pulses. No carotid bruits.  Abdomen: no tenderness, no masses palpated. No hepatosplenomegaly. Bowel sounds positive.  Musculoskeletal: no clubbing / cyanosis.  Deformity and swelling noted of the right ankle.  Swelling and deformity noted of the left ankle. Neurologic: CN 2-12 grossly intact. Sensation intact, DTR normal. Strength 5/5 in all 4.  Psychiatric: Normal judgment and insight. Alert and oriented x 3. Normal mood.   Data Reviewed:  reviewed labs, imaging, and pertinent records as documented.  Assessment and Plan:  Right ankle fracture/right Lisfranc injury with associated medial malleolus fracture secondary to fall Acute.  Patient presents after slipping at home falling injuring her right ankle.  X-rays revealed minimally displaced fracture of the lateral malleolus, fracture of the medial base of the second metatarsal with widening of the first intermetatarsal space, no additional fracture involving the base of the third metatarsal along with the lateral corner of the cuboid suspected. - Admit to a MedSurg bed at [facility] - Nonweightbearing on right ankle - Regular diet and n.p.o. after midnight - Oxycodone /morphine  IV as needed for moderate to severe pain respectively -  Appreciate orthopedic consultative services  Status post left ankle ORIF Patient was scheduled for a redo procedure of the left. - Per orthopedics  History of Takotsubo cardiomyopathy Patient appears to be euvolemic on physical exam.  ast echocardiogram noted EF to be 60 to 65% with normal diastolic parameters when last checked 2//2025.  Hypertension Blood pressure in noted to be elevated up to 141/90. - Consider resuming lisinopril  hydrochlorothiazide  in a.m. - Hydralazine  IV as needed   Tobacco abuse Patient reports smoking quarter of a pack cigarettes per day on average.  She declined needed nicotine  patch. - Counseled on the need of cessation of tobacco use  DVT prophylaxis: SCDs due to pending surgery  Advance Care Planning:   Code Status: Full Code    Consults: Orthopedics  Family Communication: None requested  Severity of Illness: The appropriate patient status for this patient is INPATIENT. Inpatient status is judged to be reasonable and necessary in order to provide  the required intensity of service to ensure the patient's safety. The patient's presenting symptoms, physical exam findings, and initial radiographic and laboratory data in the context of their chronic comorbidities is felt to place them at high risk for further clinical deterioration. Furthermore, it is not anticipated that the patient will be medically stable for discharge from the hospital within 2 midnights of admission.   * I certify that at the point of admission it is my clinical judgment that the patient will require inpatient hospital care spanning beyond 2 midnights from the point of admission due to high intensity of service, high risk for further deterioration and high frequency of surveillance required.*  Author: Maximino DELENA Sharps, MD 04/02/2024 8:52 AM  For on call review [email or web address].     [1]  Allergies Allergen Reactions   Imitrex [Sumatriptan] Anaphylaxis, Other (See Comments) and  Hypertension    Numbness    Banana Other (See Comments)    Stomach cramps   "

## 2024-04-02 NOTE — Plan of Care (Signed)
   Problem: Education: Goal: Knowledge of General Education information will improve Description: Including pain rating scale, medication(s)/side effects and non-pharmacologic comfort measures Outcome: Progressing   Problem: Nutrition: Goal: Adequate nutrition will be maintained Outcome: Progressing   Problem: Coping: Goal: Level of anxiety will decrease Outcome: Progressing

## 2024-04-02 NOTE — ED Triage Notes (Signed)
 Patient lost her balance and injured her right foot last night .

## 2024-04-02 NOTE — Consult Note (Signed)
 Reason for Consult: Right foot injury Referring Physician: Dr. Claudene Doyce LITTIE [name] is an 49 y.o. female.  HPI: 49 year old female with past medical history significant for smoking complains of right foot pain since a fall yesterday.  She describes twisting her foot.  She was seen in the emergency room at [facility].  She complains of aching pain as well as swelling in the foot.  She feels a bit better now that she is in the splint.  She hurts more when she lets it hang down.  She describes the pain as throbbing and aching.  She has a history of left ankle fracture a few months ago that has gone on to nonunion and hardware failure with posttraumatic arthritis of the ankle.  She was originally on the operating room schedule for left ankle arthrodesis and hardware removal tomorrow morning.  She is admitted now to the internal medicine service for evaluation and treatment of her right foot injury.  She states that she has quit smoking.  She denies any history of diabetes.  Past Medical History:  Diagnosis Date   Acid reflux    ACL tear 2011   not sure which side   Acute lateral meniscus tear of right knee    Anemia    Anxiety    Arthritis    Back pain    slipped disc lower back lumbar   Bipolar 1 disorder (HCC)    Depression    Dysfunctional uterine bleeding    Eczema    Frequent UTI    History of blood transfusion    Hypertension    Insomnia    Migraine headache    Neuromuscular disorder (HCC)    leg cramps, neuropathy feet   Osteoarthritis of right knee 12/21/2014   ACL deficit since 2011   Pyelonephritis 10/19/2022   Respiratory distress 01/12/2022   Sepsis with acute hypoxic respiratory failure without septic shock, due to unspecified organism (HCC) 01/06/2022    Past Surgical History:  Procedure Laterality Date   BIOPSY  09/19/2017   Procedure: BIOPSY;  Surgeon: Kristin Claw, MD;  Location: [facility];  Service: Gastroenterology;;   BIOPSY  10/15/2019   Procedure:  BIOPSY;  Surgeon: Kristin Specking, MD;  Location: [facility];  Service: Endoscopy;;   BIOPSY  04/19/2023   Procedure: BIOPSY;  Surgeon: Kristin Inocente HERO, MD;  Location: [facility] ENDOSCOPY;  Service: Gastroenterology;;   COLONOSCOPY N/A 10/15/2019   Procedure: COLONOSCOPY;  Surgeon: Kristin Specking, MD;  Location: [facility];  Service: Endoscopy;  Laterality: N/A;   DILITATION & CURRETTAGE/HYSTROSCOPY WITH HYDROTHERMAL ABLATION N/A 04/04/2017   Procedure: DILATATION & CURETTAGE/HYSTEROSCOPY WITH HYDROTHERMAL ABLATION;  Surgeon: Kristin Glenys RAMAN, MD;  Location: [facility];  Service: Gynecology;  Laterality: N/A;   ESOPHAGOGASTRODUODENOSCOPY N/A 10/15/2019   Procedure: ESOPHAGOGASTRODUODENOSCOPY (EGD);  Surgeon: Kristin Specking, MD;  Location: [facility];  Service: Endoscopy;  Laterality: N/A;   ESOPHAGOGASTRODUODENOSCOPY (EGD) WITH PROPOFOL  N/A 09/19/2017   Procedure: ESOPHAGOGASTRODUODENOSCOPY (EGD) WITH PROPOFOL ;  Surgeon: Kristin Claw, MD;  Location: [facility];  Service: Gastroenterology;  Laterality: N/A;   ESOPHAGOGASTRODUODENOSCOPY (EGD) WITH PROPOFOL  N/A 04/19/2023   Procedure: ESOPHAGOGASTRODUODENOSCOPY (EGD) WITH PROPOFOL ;  Surgeon: Kristin Inocente HERO, MD;  Location: [facility];  Service: Gastroenterology;  Laterality: N/A;   KNEE ARTHROSCOPY WITH LATERAL MENISECTOMY Right 09/30/2019   Procedure: KNEE ARTHROSCOPY WITH LATERAL MENISECTOMY;  Surgeon: Kristin Evalene BIRCH, MD;  Location: [facility];  Service: Orthopedics;  Laterality: Right;   LIGAMENT REPAIR Left 12/18/2023  Procedure: REPAIR, LIGAMENT;  Surgeon: Kristin Rush, MD;  Location: [facility];  Service: Orthopedics;  Laterality: Left;   ORIF ANKLE FRACTURE Left 12/18/2023   Procedure: OPEN REDUCTION INTERNAL FIXATION (ORIF) ANKLE FRACTURE;  Surgeon: Kristin Rush, MD;  Location: [facility];  Service: Orthopedics;  Laterality: Left;   RIGHT/LEFT HEART CATH AND CORONARY ANGIOGRAPHY N/A 01/09/2022    Procedure: RIGHT/LEFT HEART CATH AND CORONARY ANGIOGRAPHY;  Surgeon: Kristin Newman PARAS, MD;  Location: [facility];  Service: Cardiovascular;  Laterality: N/A;   SYNDESMOSIS REPAIR Left 12/18/2023   Procedure: REPAIR, SYNDESMOSIS, ANKLE;  Surgeon: Kristin Rush, MD;  Location: [facility];  Service: Orthopedics;  Laterality: Left;   TUBAL LIGATION  yrs ago    Family History  Problem Relation Age of Onset   Breast cancer Maternal Grandmother    Hypertension Mother    Hypertension Brother    Colon cancer Maternal Uncle    Bipolar disorder Cousin    Schizophrenia Cousin     Social History:  reports that she has been smoking cigarettes. She has a 1.5 pack-year smoking history. She has been exposed to tobacco smoke. She has never used smokeless tobacco. She reports current alcohol use. She reports that she does not use drugs.  Allergies: Allergies[1]  Medications: I have reviewed the patient's current medications.  Results for orders placed or performed during the hospital encounter of 04/02/24 (from the past 48 hours)  CBC with Differential     Status: Abnormal   Collection Time: 04/02/24  9:21 AM  Result Value Ref Range   WBC 7.8 4.0 - 10.5 K/uL   RBC 4.48 3.87 - 5.11 MIL/uL   Hemoglobin 12.8 12.0 - 15.0 g/dL   HCT 61.2 63.9 - 53.9 %   MCV 86.4 80.0 - 100.0 fL   MCH 28.6 26.0 - 34.0 pg   MCHC 33.1 30.0 - 36.0 g/dL   RDW 81.8 (H) 88.4 - 84.4 %   Platelets 164 150 - 400 K/uL   nRBC 0.0 0.0 - 0.2 %   Neutrophils Relative % 59 %   Neutro Abs 4.5 1.7 - 7.7 K/uL   Lymphocytes Relative 27 %   Lymphs Abs 2.1 0.7 - 4.0 K/uL   Monocytes Relative 10 %   Monocytes Absolute 0.8 0.1 - 1.0 K/uL   Eosinophils Relative 3 %   Eosinophils Absolute 0.3 0.0 - 0.5 K/uL   Basophils Relative 1 %   Basophils Absolute 0.1 0.0 - 0.1 K/uL   Immature Granulocytes 0 %   Abs Immature Granulocytes 0.02 0.00 - 0.07 K/uL    Comment: Performed at [facility], [address] [place], [place]  [address]  Basic metabolic panel     Status: Abnormal   Collection Time: 04/02/24  9:21 AM  Result Value Ref Range   Sodium 138 135 - 145 mmol/L   Potassium 3.6 3.5 - 5.1 mmol/L   Chloride 102 98 - 111 mmol/L   CO2 22 22 - 32 mmol/L   Glucose, Bld 77 70 - 99 mg/dL    Comment: Glucose reference range applies only to samples taken after fasting for at least 8 hours.   BUN 15 6 - 20 mg/dL   Creatinine, Ser 8.94 (H) 0.44 - 1.00 mg/dL   Calcium  8.2 (L) 8.9 - 10.3 mg/dL   GFR, Estimated >39 >39 mL/min    Comment: (NOTE) Calculated using the CKD-EPI Creatinine Equation (2021)    Anion gap 14 5 - 15    Comment:  Performed at [facility], [address] [place], [place] [address]    CT Foot Right Wo Contrast Result Date: 04/02/2024 CLINICAL DATA:  Right foot and ankle injury.  Fracture evaluation. EXAM: CT OF THE RIGHT FOOT WITHOUT CONTRAST CT OF THE RIGHT ANKLE WITHOUT CONTRAST TECHNIQUE: Multidetector CT imaging of the right foot was performed according to the standard protocol. Multiplanar CT image reconstructions were also generated. RADIATION DOSE REDUCTION: This exam was performed according to the departmental dose-optimization program which includes automated exposure control, adjustment of the mA and/or kV according to patient size and/or use of iterative reconstruction technique. COMPARISON:  Earlier same day radiographs of the right foot and ankle. FINDINGS: Bones/Joint/Cartilage Ankle: Acute fracture of the lateral malleolus below the level of the syndesmosis demonstrates 3 mm of posterior displacement. Cortical irregularity and trabecular lucency of the posterior malleolus is suspicious for acute nondisplaced fracture. Ankle mortise is relatively congruent.  No dislocation. Foot: Acute comminuted intra-articular fracture of the medial base of the second metatarsal at the level of the Lisfranc joint with dominant 10 x 9 x 7 mm fracture fragment demonstrating approximately 4 mm of dorsal  displacement. There is approximately 5 mm of lateral subluxation of the lateral base of the second metatarsal relative to the intermediate cuneiform. Tiny minimally distracted avulsion fracture fragments of the intermediate cuneiform at the dorsal aspect of the second TMT joint. Irregularity of the Lisfranc joint without evidence of significant diastasis. Acute comminuted and displaced intra-articular fracture of the medial base of the third metatarsal with approximately 5 mm of lateral displacement/lateral subluxation of the base of the third metatarsal relative to the lateral cuneiform. Mildly distracted avulsion fracture fragments at the dorsal aspect of third TMT joint. Acute intra-articular fracture of the lateral base of the fourth metatarsal with approximately 7 mm of medial displacement of the fracture fragment. Acute mildly distracted fracture of the adjacent cuboid. Acute mildly displaced intra-articular fracture of the medial cuneiform involving the first TMT joint. Acute nondisplaced fracture of the base of the first metatarsal can not be excluded. Remote healed fracture of the proximal phalanx of the great toe. Mild degenerative changes of the first MTP joint. Ligaments Ligaments are suboptimally evaluated by CT. Muscles and Tendons Generalized atrophy of the musculature. Achilles tendon is intact. Flexor and extensor tendons appear intact. There is common peroneal tendon sheath fluid with indistinctness of the peroneal brevis tendon at the level of the lateral malleolar fracture. The peroneal brevis tendon appears grossly intact distally although diminutive. Soft tissue Subcutaneous edema and soft tissue swelling the lateral ankle extending through the lateral and dorsal hindfoot. Soft tissue swelling of the midfoot. IMPRESSION: Foot: 1. Acute comminuted intra-articular fracture of the medial base of the second metatarsal at the level of the Lisfranc joint with dominant 10 x 9 x 7 mm fracture fragment  demonstrating approximately 4 mm of dorsal displacement. There is approximately 5 mm of lateral subluxation of the lateral base of the second metatarsal relative to the intermediate cuneiform. Tiny minimally distracted avulsion fracture fragments of the intermediate cuneiform at the dorsal aspect of the second TMT joint. Irregularity of the Lisfranc joint without evidence of significant diastasis. 2. Acute comminuted and displaced intra-articular fracture of the medial base of the third metatarsal with approximately 5 mm of lateral displacement/lateral subluxation of the base of the third metatarsal relative to the lateral cuneiform. Mildly distracted avulsion fracture fragments at the dorsal aspect of third TMT joint. 3. Acute displaced intra-articular fracture of the  lateral base of the fourth metatarsal. 4. Acute mildly distracted fracture of the adjacent cuboid. 5. Acute mildly displaced intra-articular fracture of the medial cuneiform involving the first TMT joint. 6. Acute nondisplaced fracture of the base of the first metatarsal can not be excluded. Ankle: 1. Acute mildly displaced fracture of the lateral malleolus, below the level of the syndesmosis. 2. Findings suspicious for acute nondisplaced fracture of the posterior malleolus. 3. There is common peroneal tendon sheath fluid with indistinctness of the peroneal brevis tendon at the level of the lateral malleolar fracture. The peroneal brevis tendon appears grossly intact distally although diminutive. 4. Subcutaneous edema and soft tissue swelling the lateral ankle extending through the lateral and dorsal hindfoot. Soft tissue swelling of the midfoot. Electronically Signed   By: Harrietta Sherry M.D.   On: 04/02/2024 12:39   CT Ankle Right Wo Contrast Result Date: 04/02/2024 CLINICAL DATA:  Right foot and ankle injury.  Fracture evaluation. EXAM: CT OF THE RIGHT FOOT WITHOUT CONTRAST CT OF THE RIGHT ANKLE WITHOUT CONTRAST TECHNIQUE: Multidetector CT  imaging of the right foot was performed according to the standard protocol. Multiplanar CT image reconstructions were also generated. RADIATION DOSE REDUCTION: This exam was performed according to the departmental dose-optimization program which includes automated exposure control, adjustment of the mA and/or kV according to patient size and/or use of iterative reconstruction technique. COMPARISON:  Earlier same day radiographs of the right foot and ankle. FINDINGS: Bones/Joint/Cartilage Ankle: Acute fracture of the lateral malleolus below the level of the syndesmosis demonstrates 3 mm of posterior displacement. Cortical irregularity and trabecular lucency of the posterior malleolus is suspicious for acute nondisplaced fracture. Ankle mortise is relatively congruent.  No dislocation. Foot: Acute comminuted intra-articular fracture of the medial base of the second metatarsal at the level of the Lisfranc joint with dominant 10 x 9 x 7 mm fracture fragment demonstrating approximately 4 mm of dorsal displacement. There is approximately 5 mm of lateral subluxation of the lateral base of the second metatarsal relative to the intermediate cuneiform. Tiny minimally distracted avulsion fracture fragments of the intermediate cuneiform at the dorsal aspect of the second TMT joint. Irregularity of the Lisfranc joint without evidence of significant diastasis. Acute comminuted and displaced intra-articular fracture of the medial base of the third metatarsal with approximately 5 mm of lateral displacement/lateral subluxation of the base of the third metatarsal relative to the lateral cuneiform. Mildly distracted avulsion fracture fragments at the dorsal aspect of third TMT joint. Acute intra-articular fracture of the lateral base of the fourth metatarsal with approximately 7 mm of medial displacement of the fracture fragment. Acute mildly distracted fracture of the adjacent cuboid. Acute mildly displaced intra-articular fracture  of the medial cuneiform involving the first TMT joint. Acute nondisplaced fracture of the base of the first metatarsal can not be excluded. Remote healed fracture of the proximal phalanx of the great toe. Mild degenerative changes of the first MTP joint. Ligaments Ligaments are suboptimally evaluated by CT. Muscles and Tendons Generalized atrophy of the musculature. Achilles tendon is intact. Flexor and extensor tendons appear intact. There is common peroneal tendon sheath fluid with indistinctness of the peroneal brevis tendon at the level of the lateral malleolar fracture. The peroneal brevis tendon appears grossly intact distally although diminutive. Soft tissue Subcutaneous edema and soft tissue swelling the lateral ankle extending through the lateral and dorsal hindfoot. Soft tissue swelling of the midfoot. IMPRESSION: Foot: 1. Acute comminuted intra-articular fracture of the medial base of the second metatarsal at  the level of the Lisfranc joint with dominant 10 x 9 x 7 mm fracture fragment demonstrating approximately 4 mm of dorsal displacement. There is approximately 5 mm of lateral subluxation of the lateral base of the second metatarsal relative to the intermediate cuneiform. Tiny minimally distracted avulsion fracture fragments of the intermediate cuneiform at the dorsal aspect of the second TMT joint. Irregularity of the Lisfranc joint without evidence of significant diastasis. 2. Acute comminuted and displaced intra-articular fracture of the medial base of the third metatarsal with approximately 5 mm of lateral displacement/lateral subluxation of the base of the third metatarsal relative to the lateral cuneiform. Mildly distracted avulsion fracture fragments at the dorsal aspect of third TMT joint. 3. Acute displaced intra-articular fracture of the lateral base of the fourth metatarsal. 4. Acute mildly distracted fracture of the adjacent cuboid. 5. Acute mildly displaced intra-articular fracture of the  medial cuneiform involving the first TMT joint. 6. Acute nondisplaced fracture of the base of the first metatarsal can not be excluded. Ankle: 1. Acute mildly displaced fracture of the lateral malleolus, below the level of the syndesmosis. 2. Findings suspicious for acute nondisplaced fracture of the posterior malleolus. 3. There is common peroneal tendon sheath fluid with indistinctness of the peroneal brevis tendon at the level of the lateral malleolar fracture. The peroneal brevis tendon appears grossly intact distally although diminutive. 4. Subcutaneous edema and soft tissue swelling the lateral ankle extending through the lateral and dorsal hindfoot. Soft tissue swelling of the midfoot. Electronically Signed   By: Harrietta Sherry M.D.   On: 04/02/2024 12:39   DG Ankle Right Port Result Date: 04/02/2024 CLINICAL DATA:  Trauma to the right ankle. EXAM: PORTABLE RIGHT ANKLE - 2 VIEW COMPARISON:  Right foot radiograph dated 04/03/2023. FINDINGS: Mildly displaced fracture of the lateral malleolus with approximately 5 mm lateral displacement of the distal fracture fragment. Fracture of the medial base of the second metatarsal with widening of the first intermetatarsal space in keeping with Lisfranc injury. Additional fracture involving the base of the third metatarsal as well as lateral corner of the cuboid suspected. Further evaluation with CT is recommended. The bones are osteopenic. There is soft tissue swelling over the lateral malleolus. No radiopaque foreign object or soft tissue gas. IMPRESSION: 1. Mildly displaced fracture of the lateral malleolus. 2. Fracture of the medial base of the second metatarsal with widening of the first intermetatarsal space in keeping with Lisfranc injury. 3. Additional fracture involving the base of the third metatarsal as well as lateral corner of the cuboid suspected. Electronically Signed   By: Vanetta Chou M.D.   On: 04/02/2024 08:02   DG Foot Complete Right Result  Date: 04/02/2024 CLINICAL DATA:  Right foot injury with pain. EXAM: RIGHT FOOT COMPLETE - 3+ VIEW COMPARISON:  None Available. FINDINGS: There is an acute corner fracture from the medial base of the second toe metatarsal involving the articular surface. Subtle lucency noted at the medial base of the third metatarsal but no discrete fracture fragment can be identified. No other evidence for an acute fracture. No dislocation. IMPRESSION: 1. Acute corner fracture from the medial base of the second toe metatarsal involving the articular surface. 2. Subtle lucency at the medial base of the third metatarsal without discrete fracture fragment. CT imaging may prove helpful to further evaluate as clinically warranted. Electronically Signed   By: Camellia Candle M.D.   On: 04/02/2024 07:29    ROS: No recent fever, chills, nausea, vomiting or changes in her  appetite.  10 system review is otherwise negative. PE:  Blood pressure 129/73, pulse (!) 109, temperature 99.3 F (37.4 C), temperature source Oral, resp. rate 15, height 5' 4 (1.626 m), weight 82.1 kg, SpO2 98%. Well-nourished well-developed woman in no apparent distress.  Alert and oriented.  Normal mood and affect.  Gait is nonweightbearing on the right lower extremity.  Left ankle is in valgus.  Healthy skin.  Healed surgical incisions.  No signs of infection.  There is some swelling around the left ankle.  The right foot is swollen.  Active dorsiflexion and plantarflexion strength at the toes.  Brisk capillary refill at the toes.  Intact sensibility to light touch dorsally and plantarly at the forefoot.  Skin is healthy and intact.  No lymphadenopathy proximal to the right lower extremity splint.  Assessment/Plan: Right foot acute Lisfranc injury including 2nd and 3rd metatarsal base fractures with apparent involvement of the 1st through 5th TMT joints and the medial intercuneiform joint.  I explained the nature of this injury to the patient in detail.  She  has an unstable Lisfranc injury.  I believe it is medically necessary to consider surgical treatment for this displaced and unstable fracture pattern.  She will need open treatment of the 1st through 3rd TMT joints with internal fixation and open treatment with internal fixation of the medial intercuneiform joint.  Likely percutaneous fixation of the 4th and 5th TMT joints.  The left ankle arthrodesis scheduled for tomorrow will be deferred to a later date.  She is safe to continue bearing weight as tolerated in the cam boot.  Her skin appears to be holding up okay to her valgus malalignment.  Preop orders are entered.  N.p.o. after midnight.  Hold blood thinners.  The risks and benefits of the alternative treatment options have been discussed in detail.  The patient wishes to proceed with surgery and specifically understands risks of bleeding, infection, nerve damage, blood clots, need for additional surgery, amputation and death.   Kristin [name] 04-14-24, 7:49 PM          [1]  Allergies Allergen Reactions   Imitrex [Sumatriptan] Anaphylaxis, Other (See Comments) and Hypertension    Numbness    Banana Other (See Comments)    Stomach cramps

## 2024-04-02 NOTE — Anesthesia Preprocedure Evaluation (Signed)
 "                                  Anesthesia Evaluation  Patient identified by MRN, date of birth, ID band Patient awake    Reviewed: Allergy & Precautions, NPO status , Patient's Chart, lab work & pertinent test results  History of Anesthesia Complications Negative for: history of anesthetic complications  Airway Mallampati: III  TM Distance: >3 FB Neck ROM: Full   Comment: Previous grade II view with MAC 3, easy mask  Dental  (+) Dental Advisory Given   Pulmonary neg shortness of breath, neg sleep apnea, neg COPD, neg recent URI, Current Smoker and Patient abstained from smoking.   Pulmonary exam normal breath sounds clear to auscultation       Cardiovascular hypertension (lisinopril -HCTZ), Pt. on medications (-) angina +CHF (h/o Takotsubo cardiomyopathy 12/2021, EF 60-65%)  (-) Past MI, (-) Cardiac Stents and (-) CABG + Valvular Problems/Murmurs (mild) MR  Rhythm:Regular Rate:Normal  TTE 04/17/2023: IMPRESSIONS    1. Left ventricular ejection fraction, by estimation, is 60 to 65%. The  left ventricle has normal function. The left ventricle has no regional  wall motion abnormalities. Left ventricular diastolic parameters were  normal.   2. Right ventricular systolic function is normal. The right ventricular  size is normal. Tricuspid regurgitation signal is inadequate for assessing  PA pressure.   3. The mitral valve is normal in structure. Mild mitral valve  regurgitation. No evidence of mitral stenosis.   4. The aortic valve is normal in structure. Aortic valve regurgitation is  not visualized. No aortic stenosis is present.   5. The inferior vena cava is normal in size with greater than 50%  respiratory variability, suggesting right atrial pressure of 3 mmHg.   RHC 01/09/2022: Right dominant circulation. No significant coronary artery disease.   RA: 9 mmHg RV: 37/4 mmHg PA: 38/16 mmHg, mPAP 26 mmHg PCW: 15 mmHg   CO: 6.0 L/min CI: 3.3 L./min/m2    Mildly decompensated nonischemic cardiomyopathy Mild PH, WHO grp II     Neuro/Psych  Headaches, neg Seizures PSYCHIATRIC DISORDERS (panic attacks) Anxiety Depression Bipolar Disorder    Neuromuscular disease (neuropathy of feet, lumbar spinal stenosis)    GI/Hepatic ,GERD  ,,(+)     substance abuse  alcohol use  Endo/Other  negative endocrine ROS    Renal/GU negative Renal ROS     Musculoskeletal  (+) Arthritis , Osteoarthritis,    Abdominal   Peds  Hematology negative hematology ROS (+) Lab Results      Component                Value               Date                      WBC                      7.8                 04/02/2024                HGB                      12.8                04/02/2024  HCT                      38.7                04/02/2024                MCV                      86.4                04/02/2024                PLT                      164                 04/02/2024               Anesthesia Other Findings Cyclical vomiting syndrome - reports this is GERD and is controlled  Reproductive/Obstetrics                              Anesthesia Physical Anesthesia Plan  ASA: 3  Anesthesia Plan: General   Post-op Pain Management: Regional block* and Tylenol  PO (pre-op)*   Induction: Intravenous  PONV Risk Score and Plan: 2 and Ondansetron , Dexamethasone , Midazolam  and Treatment may vary due to age or medical condition  Airway Management Planned: LMA  Additional Equipment:   Intra-op Plan:   Post-operative Plan: Extubation in OR  Informed Consent: I have reviewed the patients History and Physical, chart, labs and discussed the procedure including the risks, benefits and alternatives for the proposed anesthesia with the patient or authorized representative who has indicated his/her understanding and acceptance.     Dental advisory given  Plan Discussed with: CRNA and  Anesthesiologist  Anesthesia Plan Comments: (Risks of general anesthesia discussed including, but not limited to, sore throat, hoarse voice, chipped/damaged teeth, injury to vocal cords, nausea and vomiting, allergic reactions, lung infection, heart attack, stroke, and death. All questions answered. )         Anesthesia Quick Evaluation  "

## 2024-04-02 NOTE — ED Notes (Signed)
 [name]

## 2024-04-03 ENCOUNTER — Inpatient Hospital Stay (HOSPITAL_COMMUNITY): Payer: Self-pay | Admitting: Anesthesiology

## 2024-04-03 ENCOUNTER — Encounter (HOSPITAL_COMMUNITY)
Admission: EM | Disposition: A | Discharge status: Skilled Nursing Facility | Payer: Self-pay | Source: Home / Self Care | Attending: Internal Medicine

## 2024-04-03 ENCOUNTER — Encounter (HOSPITAL_COMMUNITY): Payer: Self-pay | Admitting: Anesthesiology

## 2024-04-03 ENCOUNTER — Encounter (HOSPITAL_COMMUNITY): Payer: Self-pay | Admitting: Internal Medicine

## 2024-04-03 ENCOUNTER — Ambulatory Visit (HOSPITAL_COMMUNITY): Admission: RE | Admit: 2024-04-03 | Source: Home / Self Care | Admitting: Orthopedic Surgery

## 2024-04-03 DIAGNOSIS — I5021 Acute systolic (congestive) heart failure: Secondary | ICD-10-CM | POA: Diagnosis not present

## 2024-04-03 DIAGNOSIS — S92321A Displaced fracture of second metatarsal bone, right foot, initial encounter for closed fracture: Secondary | ICD-10-CM

## 2024-04-03 DIAGNOSIS — S82891A Other fracture of right lower leg, initial encounter for closed fracture: Secondary | ICD-10-CM | POA: Diagnosis not present

## 2024-04-03 DIAGNOSIS — I110 Hypertensive heart disease with heart failure: Secondary | ICD-10-CM

## 2024-04-03 LAB — CBC
HCT: 33.9 % — ABNORMAL LOW (ref 36.0–46.0)
Hemoglobin: 11.1 g/dL — ABNORMAL LOW (ref 12.0–15.0)
MCH: 28.5 pg (ref 26.0–34.0)
MCHC: 32.7 g/dL (ref 30.0–36.0)
MCV: 87.1 fL (ref 80.0–100.0)
Platelets: 114 K/uL — ABNORMAL LOW (ref 150–400)
RBC: 3.89 MIL/uL (ref 3.87–5.11)
RDW: 17.9 % — ABNORMAL HIGH (ref 11.5–15.5)
WBC: 6.1 K/uL (ref 4.0–10.5)
nRBC: 0 % (ref 0.0–0.2)

## 2024-04-03 LAB — CREATININE, SERUM
Creatinine, Ser: 0.81 mg/dL (ref 0.44–1.00)
GFR, Estimated: >60 mL/min

## 2024-04-03 MED ORDER — PHENYLEPHRINE 80 MCG/ML (10ML) SYRINGE FOR IV PUSH (FOR BLOOD PRESSURE SUPPORT)
PREFILLED_SYRINGE | INTRAVENOUS | Status: DC | PRN
  Administered 2024-04-03 (×2): 160 ug via INTRAVENOUS

## 2024-04-03 MED ORDER — PROPOFOL 10 MG/ML IV BOLUS
INTRAVENOUS | Status: DC | PRN
  Administered 2024-04-03: 200 mg via INTRAVENOUS

## 2024-04-03 MED ORDER — MIDAZOLAM HCL (PF) 2 MG/2ML IJ SOLN
INTRAMUSCULAR | Status: DC | PRN
  Administered 2024-04-03: 2 mg via INTRAVENOUS

## 2024-04-03 MED ORDER — FENTANYL CITRATE (PF) 100 MCG/2ML IJ SOLN
INTRAMUSCULAR | Status: AC
  Filled 2024-04-03: qty 2

## 2024-04-03 MED ORDER — BUPIVACAINE HCL (PF) 0.25 % IJ SOLN
INTRAMUSCULAR | Status: DC | PRN
  Administered 2024-04-03: 15 mL via PERINEURAL

## 2024-04-03 MED ORDER — ACETAMINOPHEN 500 MG PO TABS
1000.0000 mg | ORAL_TABLET | Freq: Once | ORAL | Status: AC
  Administered 2024-04-03: 1000 mg via ORAL
  Filled 2024-04-03: qty 2

## 2024-04-03 MED ORDER — FENTANYL CITRATE (PF) 50 MCG/ML IJ SOSY
25.0000 ug | PREFILLED_SYRINGE | INTRAMUSCULAR | Status: DC | PRN
  Administered 2024-04-03 (×2): 50 ug via INTRAVENOUS

## 2024-04-03 MED ORDER — OXYCODONE HCL 5 MG PO TABS: 5.0000 mg | ORAL_TABLET | ORAL | Status: DC | PRN

## 2024-04-03 MED ORDER — DIPHENHYDRAMINE HCL 50 MG/ML IJ SOLN
25.0000 mg | Freq: Once | INTRAMUSCULAR | Status: AC
  Administered 2024-04-03: 25 mg via INTRAVENOUS
  Filled 2024-04-03: qty 1

## 2024-04-03 MED ORDER — ENOXAPARIN SODIUM 40 MG/0.4ML IJ SOSY
40.0000 mg | PREFILLED_SYRINGE | INTRAMUSCULAR | Status: DC
  Administered 2024-04-04: 40 mg via SUBCUTANEOUS
  Filled 2024-04-03 (×2): qty 0.4

## 2024-04-03 MED ORDER — ACETAMINOPHEN 10 MG/ML IV SOLN
INTRAVENOUS | Status: AC
  Filled 2024-04-03: qty 100

## 2024-04-03 MED ORDER — OXYCODONE HCL 5 MG PO TABS
ORAL_TABLET | ORAL | Status: AC
  Filled 2024-04-03: qty 1

## 2024-04-03 MED ORDER — CHLORHEXIDINE GLUCONATE 4 % EX SOLN: 60.0000 mL | Freq: Once | CUTANEOUS | Status: DC

## 2024-04-03 MED ORDER — NAPROXEN 250 MG PO TABS
250.0000 mg | ORAL_TABLET | Freq: Two times a day (BID) | ORAL | Status: DC
  Administered 2024-04-03 – 2024-04-16 (×26): 250 mg via ORAL
  Filled 2024-04-03 (×27): qty 1

## 2024-04-03 MED ORDER — OXYCODONE HCL 5 MG PO TABS
5.0000 mg | ORAL_TABLET | Freq: Once | ORAL | Status: AC | PRN
  Administered 2024-04-03: 5 mg via ORAL

## 2024-04-03 MED ORDER — AMISULPRIDE (ANTIEMETIC) 5 MG/2ML IV SOLN: 10.0000 mg | Freq: Once | INTRAVENOUS | Status: DC | PRN

## 2024-04-03 MED ORDER — BUPIVACAINE HCL (PF) 0.5 % IJ SOLN
INTRAMUSCULAR | Status: DC | PRN
  Administered 2024-04-03: 10 mL via PERINEURAL

## 2024-04-03 MED ORDER — KETOROLAC TROMETHAMINE 30 MG/ML IJ SOLN
INTRAMUSCULAR | Status: AC
  Filled 2024-04-03: qty 1

## 2024-04-03 MED ORDER — ONDANSETRON HCL 4 MG/2ML IJ SOLN
INTRAMUSCULAR | Status: DC | PRN
  Administered 2024-04-03: 4 mg via INTRAVENOUS

## 2024-04-03 MED ORDER — BUPIVACAINE LIPOSOME 1.3 % IJ SUSP
INTRAMUSCULAR | Status: DC | PRN
  Administered 2024-04-03: 10 mL via PERINEURAL

## 2024-04-03 MED ORDER — DOCUSATE SODIUM 100 MG PO CAPS
100.0000 mg | ORAL_CAPSULE | Freq: Two times a day (BID) | ORAL | Status: DC
  Administered 2024-04-03: 100 mg via ORAL
  Filled 2024-04-03 (×18): qty 1

## 2024-04-03 MED ORDER — CEFAZOLIN SODIUM-DEXTROSE 2-4 GM/100ML-% IV SOLN
2.0000 g | INTRAVENOUS | Status: AC
  Administered 2024-04-03: 2 g via INTRAVENOUS
  Filled 2024-04-03: qty 100

## 2024-04-03 MED ORDER — PHENYLEPHRINE HCL-NACL 20-0.9 MG/250ML-% IV SOLN
INTRAVENOUS | Status: DC | PRN
  Administered 2024-04-03: 50 ug/min via INTRAVENOUS

## 2024-04-03 MED ORDER — 0.9 % SODIUM CHLORIDE (POUR BTL) OPTIME
TOPICAL | Status: DC | PRN
  Administered 2024-04-03: 1000 mL

## 2024-04-03 MED ORDER — MIDAZOLAM HCL 2 MG/2ML IJ SOLN
INTRAMUSCULAR | Status: AC
  Filled 2024-04-03: qty 2

## 2024-04-03 MED ORDER — ONDANSETRON HCL 4 MG/2ML IJ SOLN: 4.0000 mg | Freq: Four times a day (QID) | INTRAMUSCULAR | Status: DC | PRN

## 2024-04-03 MED ORDER — ONDANSETRON HCL 4 MG PO TABS: 4.0000 mg | ORAL_TABLET | Freq: Four times a day (QID) | ORAL | Status: DC | PRN

## 2024-04-03 MED ORDER — HYDROMORPHONE HCL 1 MG/ML IJ SOLN: 0.5000 mg | INTRAMUSCULAR | Status: DC | PRN

## 2024-04-03 MED ORDER — PHENYLEPHRINE HCL (PRESSORS) 10 MG/ML IV SOLN
INTRAVENOUS | Status: AC
  Filled 2024-04-03: qty 1

## 2024-04-03 MED ORDER — ACETAMINOPHEN 325 MG PO TABS
325.0000 mg | ORAL_TABLET | Freq: Four times a day (QID) | ORAL | Status: DC | PRN
  Administered 2024-04-08 – 2024-04-15 (×7): 650 mg via ORAL
  Filled 2024-04-03 (×7): qty 2

## 2024-04-03 MED ORDER — VANCOMYCIN HCL 1000 MG IV SOLR
INTRAVENOUS | Status: AC
  Filled 2024-04-03: qty 20

## 2024-04-03 MED ORDER — SODIUM CHLORIDE 0.9 % IV SOLN: INTRAVENOUS | Status: DC

## 2024-04-03 MED ORDER — MAGNESIUM CITRATE PO SOLN: 1.0000 | Freq: Once | ORAL | Status: DC | PRN

## 2024-04-03 MED ORDER — DIPHENHYDRAMINE HCL 25 MG PO CAPS
50.0000 mg | ORAL_CAPSULE | Freq: Four times a day (QID) | ORAL | Status: DC | PRN
  Administered 2024-04-03 – 2024-04-10 (×6): 50 mg via ORAL
  Filled 2024-04-03 (×7): qty 2

## 2024-04-03 MED ORDER — CEFAZOLIN SODIUM-DEXTROSE 2-4 GM/100ML-% IV SOLN: 2.0000 g | INTRAVENOUS | Status: DC

## 2024-04-03 MED ORDER — FENTANYL CITRATE (PF) 100 MCG/2ML IJ SOLN
INTRAMUSCULAR | Status: DC | PRN
  Administered 2024-04-03: 50 ug via INTRAVENOUS
  Administered 2024-04-03: 100 ug via INTRAVENOUS
  Administered 2024-04-03: 50 ug via INTRAVENOUS

## 2024-04-03 MED ORDER — POLYETHYLENE GLYCOL 3350 17 G PO PACK: 17.0000 g | PACK | Freq: Every day | ORAL | Status: DC | PRN

## 2024-04-03 MED ORDER — SODIUM CHLORIDE 0.9 % IV SOLN: INTRAVENOUS | Status: AC

## 2024-04-03 MED ORDER — KETOROLAC TROMETHAMINE 30 MG/ML IJ SOLN
INTRAMUSCULAR | Status: DC | PRN
  Administered 2024-04-03: 30 mg via INTRAVENOUS

## 2024-04-03 MED ORDER — CHLORHEXIDINE GLUCONATE 0.12 % MT SOLN: 15.0000 mL | Freq: Once | OROMUCOSAL | Status: DC

## 2024-04-03 MED ORDER — ORAL CARE MOUTH RINSE: 15.0000 mL | Freq: Once | OROMUCOSAL | Status: DC

## 2024-04-03 MED ORDER — METOCLOPRAMIDE HCL 5 MG PO TABS: 5.0000 mg | ORAL_TABLET | Freq: Three times a day (TID) | ORAL | Status: DC | PRN

## 2024-04-03 MED ORDER — PROPOFOL 500 MG/50ML IV EMUL
INTRAVENOUS | Status: AC
  Filled 2024-04-03: qty 50

## 2024-04-03 MED ORDER — METOCLOPRAMIDE HCL 5 MG/ML IJ SOLN: 5.0000 mg | Freq: Three times a day (TID) | INTRAMUSCULAR | Status: DC | PRN

## 2024-04-03 MED ORDER — FENTANYL CITRATE (PF) 50 MCG/ML IJ SOSY
PREFILLED_SYRINGE | INTRAMUSCULAR | Status: AC
  Filled 2024-04-03: qty 2

## 2024-04-03 MED ORDER — LIDOCAINE HCL (PF) 2 % IJ SOLN
INTRAMUSCULAR | Status: AC
  Filled 2024-04-03: qty 5

## 2024-04-03 MED ORDER — OXYCODONE HCL 5 MG/5ML PO SOLN: 5.0000 mg | Freq: Once | ORAL | Status: AC | PRN

## 2024-04-03 MED ORDER — BISACODYL 10 MG RE SUPP: 10.0000 mg | Freq: Every day | RECTAL | Status: DC | PRN

## 2024-04-03 MED ORDER — LACTATED RINGERS IV SOLN: INTRAVENOUS | Status: DC

## 2024-04-03 MED ORDER — VANCOMYCIN HCL 1000 MG IV SOLR
INTRAVENOUS | Status: DC | PRN
  Administered 2024-04-03: 1000 mg via TOPICAL

## 2024-04-03 MED ORDER — PHENYLEPHRINE 80 MCG/ML (10ML) SYRINGE FOR IV PUSH (FOR BLOOD PRESSURE SUPPORT)
PREFILLED_SYRINGE | INTRAVENOUS | Status: AC
  Filled 2024-04-03: qty 10

## 2024-04-03 MED ORDER — OXYCODONE HCL 5 MG PO TABS
10.0000 mg | ORAL_TABLET | ORAL | Status: DC | PRN
  Administered 2024-04-03: 10 mg via ORAL
  Administered 2024-04-04 – 2024-04-06 (×6): 15 mg via ORAL
  Administered 2024-04-07: 10 mg via ORAL
  Filled 2024-04-03: qty 3
  Filled 2024-04-03: qty 2
  Filled 2024-04-03 (×3): qty 3
  Filled 2024-04-03: qty 2
  Filled 2024-04-03 (×2): qty 3

## 2024-04-03 MED ORDER — POVIDONE-IODINE 10 % EX SWAB: 2.0000 | Freq: Once | CUTANEOUS | Status: DC

## 2024-04-03 NOTE — Anesthesia Procedure Notes (Signed)
 Anesthesia Regional Block: Popliteal block   Pre-Anesthetic Checklist: , timeout performed,  Correct Patient, Correct Site, Correct Laterality,  Correct Procedure, Correct Position, site marked,  Risks and benefits discussed,  Surgical consent,  Pre-op evaluation,  At surgeon's request and post-op pain management  Laterality: Right  Prep: chloraprep       Needles:  Injection technique: Single-shot  Needle Type: Echogenic Stimulator Needle     Needle Length: 9cm  Needle Gauge: 21     Additional Needles:   Procedures:,,,, ultrasound used (permanent image in chart),,    Narrative:  Start time: 04/03/2024 7:18 AM End time: 04/03/2024 7:23 AM Injection made incrementally with aspirations every 5 mL.  Performed by: Personally  Anesthesiologist: Peggye Delon Brunswick, MD  Additional Notes: Patient with pre-block numbness on top of foot up to her knee and on the bottom of her foot. She can wiggle all her toes. Foot in splint.  Discussed risks and benefits of nerve block including, but not limited to, prolonged and/or permanent nerve injury involving sensory and/or motor function. Monitors were applied and a time-out was performed. The nerve and associated structures were visualized under ultrasound guidance. After negative aspiration, local anesthetic was slowly injected around the nerve. There was no evidence of high pressure during the procedure. There were no paresthesias. VSS remained stable and the patient tolerated the procedure well.

## 2024-04-03 NOTE — Anesthesia Procedure Notes (Signed)
 Procedure Name: LMA Insertion Date/Time: 04/03/2024 7:45 AM  Performed by: Mckenzee Beem, Corean BROCKS, CRNAPre-anesthesia Checklist: Patient identified, Emergency Drugs available, Suction available and Patient being monitored Patient Re-evaluated:Patient Re-evaluated prior to induction Oxygen Delivery Method: Circle system utilized Preoxygenation: Pre-oxygenation with 100% oxygen Induction Type: IV induction Ventilation: Mask ventilation without difficulty LMA: LMA inserted LMA Size: 4.0 Number of attempts: 1 Airway Equipment and Method: Bite block Placement Confirmation: positive ETCO2 Tube secured with: Tape Dental Injury: Teeth and Oropharynx as per pre-operative assessment

## 2024-04-03 NOTE — Anesthesia Procedure Notes (Signed)
 Anesthesia Regional Block: Adductor canal block   Pre-Anesthetic Checklist: , timeout performed,  Correct Patient, Correct Site, Correct Laterality,  Correct Procedure, Correct Position, site marked,  Risks and benefits discussed,  Surgical consent,  Pre-op evaluation,  At surgeon's request and post-op pain management  Laterality: Right  Prep: chloraprep       Needles:  Injection technique: Single-shot  Needle Type: Echogenic Stimulator Needle     Needle Length: 9cm  Needle Gauge: 21     Additional Needles:   Procedures:,,,, ultrasound used (permanent image in chart),,    Narrative:  Start time: 04/03/2024 7:24 AM End time: 04/03/2024 7:27 AM Injection made incrementally with aspirations every 5 mL.  Performed by: Personally  Anesthesiologist: Peggye Delon Brunswick, MD  Additional Notes: Discussed risks and benefits of nerve block including, but not limited to, prolonged and/or permanent nerve injury involving sensory and/or motor function. Monitors were applied and a time-out was performed. The nerve and associated structures were visualized under ultrasound guidance. After negative aspiration, local anesthetic was slowly injected around the nerve. There was no evidence of high pressure during the procedure. There were no paresthesias. VSS remained stable and the patient tolerated the procedure well.

## 2024-04-03 NOTE — Progress Notes (Signed)
 Orthopedic Tech Progress Note Patient Details:  Kristin [name] 31-Aug-1975 [id number]  Ortho Devices Type of Ortho Device: CAM walker Ortho Device/Splint Location: left cam boot applied Ortho Device/Splint Interventions: Ordered, Application, Adjustment   Post Interventions Patient Tolerated: Well Instructions Provided: Adjustment of device, Care of device  Waylan Thom Loving 04/03/2024, 11:45 AM

## 2024-04-03 NOTE — Interval H&P Note (Signed)
 History and Physical Interval Note:  04/03/2024 7:26 AM  Kristin [name]  has presented today for surgery, with the diagnosis of Right Ankle Fracture.  The various methods of treatment have been discussed with the patient and family. After consideration of risks, benefits and other options for treatment, the patient has consented to  Procedures: OPEN REDUCTION INTERNAL FIXATION (ORIF) FOOT LISFRANC FRACTURE (Right) as a surgical intervention.  The patient's history has been reviewed, patient examined, no change in status, stable for surgery.  I have reviewed the patient's chart and labs.  Questions were answered to the patient's satisfaction.    The risks and benefits of the alternative treatment options have been discussed in detail.  The patient wishes to proceed with surgery and specifically understands risks of bleeding, infection, nerve damage, blood clots, need for additional surgery, amputation and death.   [name]

## 2024-04-03 NOTE — Transfer of Care (Signed)
 Immediate Anesthesia Transfer of Care Note  [name]  Procedure(s) Performed: OPEN REDUCTION INTERNAL FIXATION (ORIF) FOOT LISFRANC FRACTURE (Right: Foot)  Patient Location: PACU  Anesthesia Type:GA combined with regional for post-op pain  Level of Consciousness: awake, alert , and oriented  Airway & Oxygen Therapy: Patient Spontanous Breathing and Patient connected to face mask oxygen  Post-op Assessment: Report given to RN and Post -op Vital signs reviewed and stable  Post vital signs: Reviewed and stable  Last Vitals:  Vitals Value Taken Time  BP 129/90 04/03/24 09:53  Temp    Pulse 86 04/03/24 09:58  Resp 18 04/03/24 09:58  SpO2 100 % 04/03/24 09:58  Vitals shown include unfiled device data.  Last Pain:  Vitals:   04/03/24 0711  TempSrc:   PainSc: 4          Complications: No notable events documented.

## 2024-04-03 NOTE — Anesthesia Postprocedure Evaluation (Signed)
"   Anesthesia Post Note  [name]  Procedure(s) Performed: OPEN REDUCTION INTERNAL FIXATION (ORIF) FOOT LISFRANC FRACTURE (Right: Foot)     Patient location during evaluation: PACU Anesthesia Type: General Level of consciousness: awake Pain management: pain level controlled Vital Signs Assessment: post-procedure vital signs reviewed and stable Respiratory status: spontaneous breathing, nonlabored ventilation and respiratory function stable Cardiovascular status: blood pressure returned to baseline and stable Postop Assessment: no apparent nausea or vomiting Anesthetic complications: no   No notable events documented.  Last Vitals:  Vitals:   04/03/24 1045 04/03/24 1129  BP: (!) 115/96 120/89  Pulse: 79 73  Resp: 17 17  Temp: 36.8 C 36.6 C  SpO2: 100% 98%    Last Pain:  Vitals:   04/03/24 1129  TempSrc: Oral  PainSc:                  Delon Aisha Arch      "

## 2024-04-03 NOTE — Plan of Care (Signed)
   Problem: Coping: Goal: Level of anxiety will decrease Outcome: Progressing   Problem: Pain Managment: Goal: General experience of comfort will improve and/or be controlled Outcome: Progressing   Problem: Safety: Goal: Ability to remain free from injury will improve Outcome: Progressing

## 2024-04-03 NOTE — Evaluation (Signed)
 Physical Therapy Evaluation Patient Details Name: Kristin [name] MRN: [id number] DOB: 05-Jul-1975 Today's Date: 04/03/2024  History of Present Illness  49 y.o. female with medical history significant of hypertension, previous Takotsubo cardiomyopathy, bipolar disorder, and prior Left ankle ORIF and primary repair of deltoid ligament on 12/18/23 who presented with right ankle pain after a fall and was found to have a right ankle fracture (Lisfranc injury) and underwent surgical treatment of right ankle injury on 04/03/24.  Clinical Impression  Patient is s/p above surgery resulting in functional limitations due to the deficits listed below (see PT Problem List).  Patient will benefit from acute skilled PT to increase their independence and safety with mobility to facilitate discharge.  Pt requesting to use bathroom for urgent BM on arrival.  Pt encouraged to use BSC at first due to weight bearing limitations. Pt finally agreeable for using BSC once pt realized she was unable to stand without assist.  Pt also provided max cues for maintaining R ankle NWB however poor adherence observed (RN also notified).  Pt having difficulty transferring with Left foot in CAM boot and right ankle splinted and NWB.  Pt does not feel able to function at home safely and requesting SNF.  Patient will benefit from continued inpatient follow up therapy, <3 hours/day.         If plan is discharge home, recommend the following: A lot of help with walking and/or transfers;A lot of help with bathing/dressing/bathroom;Assistance with cooking/housework;Assist for transportation;Help with stairs or ramp for entrance   Can travel by private vehicle        Equipment Recommendations Wheelchair (measurements PT);Wheelchair cushion (measurements PT)  Recommendations for Other Services       Functional Status Assessment Patient has had a recent decline in their functional status and demonstrates the ability to make significant  improvements in function in a reasonable and predictable amount of time.     Precautions / Restrictions Precautions Precautions: Fall Restrictions Weight Bearing Restrictions Per Provider Order: Yes RLE Weight Bearing Per Provider Order: Non weight bearing LLE Weight Bearing Per Provider Order: Weight bearing as tolerated (with CAM boot)      Mobility  Bed Mobility Overal bed mobility: Needs Assistance Bed Mobility: Supine to Sit, Sit to Supine     Supine to sit: Mod assist Sit to supine: Mod assist   General bed mobility comments: pt self assisting however also requiring assist for bringing LEs over and onto bed    Transfers Overall transfer level: Needs assistance Equipment used: Rolling walker (2 wheels) Transfers: Sit to/from Stand, Bed to chair/wheelchair/BSC Sit to Stand: Mod assist Stand pivot transfers: Mod assist         General transfer comment: max cues for maintaining NWB on R foot however pt not able, significant reliance on upper body to perform transfers, pt adamant to transfer to [facility] for BM (initially wanted to get into bathroom however she realized she was not able to ambulate at this time once she started having difficulty with standing)    Ambulation/Gait                  Stairs            Wheelchair Mobility     Tilt Bed    Modified Rankin (Stroke Patients Only)       Balance Overall balance assessment: History of Falls  Pertinent Vitals/Pain Pain Assessment Pain Assessment: Faces Faces Pain Scale: Hurts even more Pain Location: right ankle Pain Descriptors / Indicators: Throbbing, Tender, Aching, Grimacing Pain Intervention(s): Repositioned, Monitored during session, Patient requesting pain meds-RN notified    Home Living Family/patient expects to be discharged to:: Skilled nursing facility                   Additional Comments: reports she has stairs  at home    Prior Function Prior Level of Function : Independent/Modified Independent             Mobility Comments: reports poor adherence to weight bearing status of previous left ankle repair       Extremity/Trunk Assessment        Lower Extremity Assessment Lower Extremity Assessment: Generalized weakness;RLE deficits/detail;LLE deficits/detail RLE Deficits / Details: in splint and NWB LLE Deficits / Details: wearing CAM boot    Cervical / Trunk Assessment Cervical / Trunk Assessment: Normal  Communication   Communication Communication: No apparent difficulties    Cognition Arousal: Alert Behavior During Therapy: WFL for tasks assessed/performed   PT - Cognitive impairments: Safety/Judgement                                 Cueing       General Comments      Exercises     Assessment/Plan    PT Assessment Patient needs continued PT services  PT Problem List Decreased strength;Decreased activity tolerance;Decreased balance;Decreased mobility;Decreased knowledge of use of DME;Pain;Decreased knowledge of precautions       PT Treatment Interventions Gait training;DME instruction;Therapeutic exercise;Therapeutic activities;Patient/family education;Functional mobility training;Balance training;Wheelchair mobility training    PT Goals (Current goals can be found in the Care Plan section)  Acute Rehab PT Goals PT Goal Formulation: With patient Time For Goal Achievement: 04/17/24 Potential to Achieve Goals: Good    Frequency Min 3X/week     Co-evaluation               AM-PAC PT 6 Clicks Mobility  Outcome Measure Help needed turning from your back to your side while in a flat bed without using bedrails?: A Lot Help needed moving from lying on your back to sitting on the side of a flat bed without using bedrails?: A Lot Help needed moving to and from a bed to a chair (including a wheelchair)?: A Lot Help needed standing up from a  chair using your arms (e.g., wheelchair or bedside chair)?: A Lot Help needed to walk in hospital room?: Total Help needed climbing 3-5 steps with a railing? : Total 6 Click Score: 10    End of Session Equipment Utilized During Treatment: Gait belt Activity Tolerance: Patient tolerated treatment well Patient left: with call bell/phone within reach;in bed;with bed alarm set Nurse Communication: Mobility status;Patient requests pain meds PT Visit Diagnosis: Muscle weakness (generalized) (M62.81);Unsteadiness on feet (R26.81);Other abnormalities of gait and mobility (R26.89)    Time: 8477-8446 PT Time Calculation (min) (ACUTE ONLY): 31 min   Charges:   PT Evaluation $PT Eval Low Complexity: 1 Low PT Treatments $Therapeutic Activity: 8-22 mins PT General Charges $$ ACUTE PT VISIT: 1 Visit       Tari PT, DPT Physical Therapist Acute Rehabilitation Services Office: [phone]   [name] 04/03/2024, 4:15 PM

## 2024-04-03 NOTE — Progress Notes (Signed)
 " Progress Note   [name] FMW:[id number] DOB: Feb 28, 1976 DOA: 04/02/2024     1 DOS: the patient was seen and examined on 04/03/2024    Brief hospital course: Kristin [name] is a 49 y.o. female with medical history significant of hypertension,  previous Takotsubo cardiomyopathy, bipolar disorder, and prior left ankle fracture s/p ORIF who presented with right ankle pain after a fall and was found to have a right ankle fracture (Lisfranc injury).  Orthopedics was consulted and patient underwent surgical treatment of her fractures/dislocations.  Postoperatively, orthopedics recommending nonweightbearing to the right lower extremity.  Assessment and Plan:  Right ankle fracture/right Lisfranc injury with associated medial malleolus fracture secondary to fall Acute.   Patient presented after slipping at home falling injuring her right ankle.   X-rays revealed minimally displaced fracture of the lateral malleolus, fracture of the medial base of the second metatarsal with widening of the first intermetatarsal space, no additional fracture involving the base of the third metatarsal along with the lateral corner of the cuboid suspected. Orthopedics was consulted. Patient underwent surgical treatment of her right foot ankle injury including management of multiple TMT dislocations and right ankle lateral malleolus fracture. - Postoperatively, orthopedics recommending nonweightbearing on right ankle and Lovenox  for DVT prophylaxis on postop day 1. - Patient to be evaluated by PT.  Status post left ankle ORIF Patient was scheduled for a redo procedure of the left. This has been deferred in the setting of new right ankle fracture. - Orthopedics recommending weightbearing as tolerated to the left lower extremity in a cam boot   History of Takotsubo cardiomyopathy Patient appears to be euvolemic on physical exam.   Last echocardiogram noted EF to be 60 to 65% with normal diastolic parameters  when last checked 2//2025. - Will monitor   Hypertension Blood pressure in noted to be elevated up to 141/90. - Plan to resume lisinopril  hydrochlorothiazide  if BP high.    Tobacco abuse Patient reports smoking quarter of a pack cigarettes per day on average.   She declined nicotine  patch. - Counseled on the need of cessation of tobacco use      Subjective: Patient evaluated postoperatively.  States her right lower extremity still feels numb (had adductor canal block).  She also complains of severe itching, which she has experienced before after surgery.  Physical Exam: BP 116/73 (BP Location: Left Arm)   Pulse 74   Temp 98.5 F (36.9 C) (Oral)   Resp 17   Ht 5' 4 (1.626 m)   Wt 82.1 kg   LMP  (LMP Unknown)   SpO2 97%   BMI 31.07 kg/m     General: Alert, oriented X3  Eyes: Pupils equal, reactive  Oral cavity: moist mucous membranes  Head: Atraumatic, normocephalic  Neck: supple  Chest: clear to auscultation. No crackles, no wheezes  CVS: S1,S2 RRR. No murmurs  Abd: No distention, soft, non-tender. No masses palpable  MSK: Right lower extremity in splint, left lower extremity in cam boot Neurological: Grossly intact.    Data Reviewed:    Latest Ref Rng & Units 04/03/2024   12:01 PM 04/02/2024    9:21 AM 03/27/2024    2:47 PM  CBC  WBC 4.0 - 10.5 K/uL 6.1  7.8  4.5   Hemoglobin 12.0 - 15.0 g/dL 88.8  87.1  86.9   Hematocrit 36.0 - 46.0 % 33.9  38.7  39.9   Platelets 150 - 400 K/uL 114  164  371  Latest Ref Rng & Units 04/03/2024   12:01 PM 04/02/2024    9:21 AM 03/27/2024    2:47 PM  BMP  Glucose 70 - 99 mg/dL  77  96   BUN 6 - 20 mg/dL  15  12   Creatinine 9.55 - 1.00 mg/dL 9.18  8.94  9.19   Sodium 135 - 145 mmol/L  138  138   Potassium 3.5 - 5.1 mmol/L  3.6  4.3   Chloride 98 - 111 mmol/L  102  104   CO2 22 - 32 mmol/L  22  24   Calcium  8.9 - 10.3 mg/dL  8.2  9.3    Family Communication: n/a  Disposition: Status is: Inpatient  DVT PPx: SQ  Lovenox       Author: MDALA-GAUSI, Brixton Franko AGATHA, MD 04/03/2024 2:20 PM  For on call review [email or web address].    "

## 2024-04-03 NOTE — Op Note (Signed)
 04/03/2024  9:58 AM  PATIENT:  Kristin [name]  49 y.o. female  PRE-OPERATIVE DIAGNOSIS: 1.  Right ankle lateral malleolus fracture 2.  Right foot Lisfranc injury including the 1st through 5th TMT joints 3.  Right foot medial intercuneiform dislocation 4.  Right second metatarsal base fracture 5.  Right third metatarsal base fracture 6.  Right medial intercuneiform fracture  POST-OPERATIVE DIAGNOSIS: Same  Procedures: 1.  Open treatment right second TMT joint dislocation with internal fixation 2.  Open treatment of right second metatarsal base fracture with internal fixation 3.  Stress examination of the right foot 4.  Open treatment of right first TMT joint dislocation with internal fixation 5.  Open treatment of right third TMT joint dislocation with internal fixation 6.  Open treatment of right second metatarsal base fracture with internal fixation 7.  Closed reduction and percutaneous pinning of the right 4th and 5th TMT joints 8.  Closed treatment of right ankle lateral malleolus fracture 9.  Right foot AP, lateral and oblique radiographs  SURGEON:  [name], MD  ASSISTANT: Dickey Sales, PA-C  ANESTHESIA:   General, regional  EBL:  minimal   TOURNIQUET:   Total Tourniquet Time Documented: Thigh (Right) - 91 minutes Total: Thigh (Right) - 91 minutes  COMPLICATIONS:  None apparent  DISPOSITION:  Extubated, awake and stable to recovery.  INDICATION FOR PROCEDURE: 49 year old female with past medical history significant for smoking injured her right foot the day before previously scheduled surgery on her left ankle.  She was seen in the emergency room where radiographs and a CT scan showed a Lisfranc injury of her right foot including injury to the 1st through 5th TMT joints and the medial intercuneiform joint as well as 2nd and 3rd metatarsal base fractures.  She also has a lateral malleolus fracture.  The decision was made to postpone her left ankle surgery due to her  severe right foot injuries.  She presents now for operative treatment of her right foot injuries.  The risks and benefits of the alternative treatment options have been discussed in detail.  The patient wishes to proceed with surgery and specifically understands risks of bleeding, infection, nerve damage, blood clots, need for additional surgery, amputation and death.   PROCEDURE IN DETAIL:  After pre operative consent was obtained, and the correct operative site was identified, the patient was brought to the operating room and placed supine on the OR table.  Anesthesia was administered.  Pre-operative antibiotics were administered.  A surgical timeout was taken.  The right lower extremity was prepped and draped in standard sterile fashion with a tourniquet around the thigh.  The extremity was elevated, and the tourniquet was inflated to 300 mmHg.  A longitudinal incision was made over the dorsum of the midfoot between the 1st and 2nd rays.  Dissection was carried sharply down through the subcutaneous tissues.  The interval between the EHL and EHB tendons was developed.  Neurovascular bundle was mobilized and retracted laterally.  Was protected throughout the case.  The second TMT joint was identified.  Comminuted fracture was identified along with displacement of the second metatarsal laterally.  The wound was cleaned of all hematoma and irrigated copiously.  The fracture of the second metatarsal base was reduced and held provisionally with a tenaculum.  An 8 hole 2.7 mm [employer] MBX mini frag plate was contoured to fit the dorsum of the joint.  It was placed over the joint and provisionally pinned.  Radiographs confirmed appropriate position  of the joint.  The plate was then secured to the middle cuneiform with 2 nonlocking screws into the second metatarsal base with 3 nonlocking screws spanning the area of fracture comminution.  AP, oblique and lateral radiographs confirmed appropriate reduction of the  second TMT joint and appropriate position and length of the hardware as well as reduction of the second metatarsal base fracture.  A stress examination was then performed.  Abduction and adduction stress was applied to the forefoot under live fluoroscopy.  The first TMT joint was noted to be unstable along with the Lisfranc joint complex and the medial intercuneiform joint.  The first TMT joint was reduced and provisionally pinned.  A 7 hole plate was then placed over the joint and provisionally pinned.  Radiographs confirmed appropriate position of the plate.  The plate was secured proximally with 2 nonlocking and 1 locking screw and distally with 2 nonlocking and 1 locking screw.  The provisional pin was then removed.  Radiographs confirmed appropriate reduction of the first TMT joint.  The third TMT joint was then reduced.  A Weber tenaculum was placed from the third metatarsal base across to the medial cuneiform.  This held the third TMT joint reduced along with the medial intercuneiform joint.  An incision was made at the medial midfoot.  Dissection was carried down through the subcutaneous tissues to the medial cuneiform.  A guidepin was placed in the homerun position from the medial cuneiform across the second metatarsal base and into the third metatarsal base.  AP, lateral and oblique radiographs confirmed appropriate reduction of the third TMT joint and the medial intercuneiform joint.  The guidepin was overdrilled and removed.  A 4 mm fully threaded solid screw from the [employer] small frag set was inserted.  It was advanced and seated at the medial cuneiform.  Radiographs confirmed appropriate length of the screw.  A guidepin was then placed from the medial cuneiform to the middle cuneiform.  Radiographs confirmed appropriate position of the guidepin.  It was overdrilled.  A 4 mm fully threaded solid screw was inserted securing reduction of the medial intercuneiform joint.  The 4th and 5th TMT  joints were then reduced.  A 1.6 mm guidepin was inserted percutaneously from the fifth metatarsal base into the cuboid.  Radiographs confirmed appropriate position of the pin.  It was bent, trimmed and capped.  The fourth TMT joint was then reduced.  A guidepin was inserted percutaneously from the fourth metatarsal base into the cuboid.  Appropriate position was confirmed with radiographs.  The pin was bent, trimmed and capped.  Final AP, oblique and lateral radiographs confirmed appropriate reduction of the 1st through 5th TMT joints and appropriate reduction of the 2nd and 3rd metatarsal base fractures.  Hardware is appropriately positioned and of the appropriate lengths.  The wounds were irrigated copiously and sprinkled with vancomycin  powder.  Subcutaneous tissues were approximated with 3-0 Monocryl.  Skin incision wounds were closed with 3-0 Monocryl.    The lateral malleolus fracture is minimally displaced and can be treated safely in closed fashion.  Sterile dressings were applied followed by a well-padded short leg splint.  The patient was awakened from anesthesia and transported to the recovery room in stable condition.  FOLLOW UP PLAN: Nonweightbearing on the right lower extremity.  Weightbearing as tolerated on the left lower extremity in a cam boot.  Lovenox  for DVT prophylaxis on postop day 1.  Plan discharge to skilled nursing when the patient is able.  RADIOGRAPHS: AP, oblique and lateral radiographs of the right foot are obtained intraoperatively.  These show interval reduction and fixation of the 1st through 5th TMT joints, 2nd and 3rd metatarsal bases and medial intercuneiform joint.  Hardware is appropriately positioned and of the appropriate lengths.  No other acute injuries are noted.   Dickey Sales, PA-C was present and scrubbed for the duration of the operative case. His assistance was essential in positioning the patient, prepping and draping, gaining and maintaining  exposure, performing the operation, closing and dressing the wounds and applying the splint.

## 2024-04-04 DIAGNOSIS — S82891A Other fracture of right lower leg, initial encounter for closed fracture: Secondary | ICD-10-CM | POA: Diagnosis not present

## 2024-04-04 LAB — BASIC METABOLIC PANEL WITH GFR
Anion gap: 8 (ref 5–15)
BUN: 13 mg/dL (ref 6–20)
CO2: 24 mmol/L (ref 22–32)
Calcium: 8.7 mg/dL — ABNORMAL LOW (ref 8.9–10.3)
Chloride: 103 mmol/L (ref 98–111)
Creatinine, Ser: 0.78 mg/dL (ref 0.44–1.00)
GFR, Estimated: >60 mL/min
Glucose, Bld: 126 mg/dL — ABNORMAL HIGH (ref 70–99)
Potassium: 4.2 mmol/L (ref 3.5–5.1)
Sodium: 136 mmol/L (ref 135–145)

## 2024-04-04 LAB — CBC
HCT: 32.1 % — ABNORMAL LOW (ref 36.0–46.0)
Hemoglobin: 10.3 g/dL — ABNORMAL LOW (ref 12.0–15.0)
MCH: 28.3 pg (ref 26.0–34.0)
MCHC: 32.1 g/dL (ref 30.0–36.0)
MCV: 88.2 fL (ref 80.0–100.0)
Platelets: 117 K/uL — ABNORMAL LOW (ref 150–400)
RBC: 3.64 MIL/uL — ABNORMAL LOW (ref 3.87–5.11)
RDW: 18 % — ABNORMAL HIGH (ref 11.5–15.5)
WBC: 5.7 K/uL (ref 4.0–10.5)
nRBC: 0 % (ref 0.0–0.2)

## 2024-04-04 MED ORDER — FAMOTIDINE 20 MG PO TABS
20.0000 mg | ORAL_TABLET | Freq: Two times a day (BID) | ORAL | Status: AC
  Administered 2024-04-04 – 2024-04-05 (×4): 20 mg via ORAL
  Filled 2024-04-04 (×4): qty 1

## 2024-04-04 MED ORDER — DEXAMETHASONE SOD PHOSPHATE PF 10 MG/ML IJ SOLN
10.0000 mg | Freq: Once | INTRAMUSCULAR | Status: AC
  Administered 2024-04-04: 10 mg via INTRAVENOUS
  Filled 2024-04-04: qty 1

## 2024-04-04 NOTE — Plan of Care (Signed)
   Problem: Coping: Goal: Level of anxiety will decrease Outcome: Progressing   Problem: Pain Managment: Goal: General experience of comfort will improve and/or be controlled Outcome: Progressing   Problem: Safety: Goal: Ability to remain free from injury will improve Outcome: Progressing

## 2024-04-04 NOTE — NC FL2 (Signed)
 " [place]  MEDICAID [id number] LEVEL OF CARE FORM     IDENTIFICATION  Patient Name: Kristin [name] Birthdate: 1975-07-25 Sex: female Admission Date (Current Location): 04/02/2024  [facility] and [place] Number:  Producer, Television/film/video and Address:  [facility],  [address], [place] [address]      Provider Number: [id number]  Attending Physician Name and Address:  Jearlean, Masiku Agat*  Relative Name and Phone Number:  Delores Sierras  Daughter, Emergency Contact  [phone] (Mobile)    Current Level of Care: Hospital Recommended Level of Care: Skilled Nursing Facility Prior Approval Number:    Date Approved/Denied:   PASRR Number: pending  Discharge Plan: SNF    Current Diagnoses: Patient Active Problem List   Diagnosis Date Noted   Closed right ankle fracture 04/02/2024   Status post ORIF of fracture of ankle 04/02/2024   History of cardiomyopathy 04/02/2024   Tobacco abuse 04/02/2024   Bimalleolar fracture of left ankle 12/18/2023   Trichomonas infection 08/25/2023   Vaginal lesion 08/25/2023   History of recurrent UTIs 08/14/2023   Elevated ferritin level 08/01/2023   Cyclical vomiting syndrome not associated with migraine 04/19/2023   Gastroesophageal reflux disease 04/18/2023   Neuropathy 06/29/2022   Physical deconditioning 01/10/2022   Stress-induced cardiomyopathy    Acute HFrEF (heart failure with reduced ejection fraction) (HCC) 01/06/2022   Substance abuse (HCC) 01/06/2022   Spinal stenosis of lumbar region with neurogenic claudication 11/25/2021   Primary osteoarthritis of both knees 12/02/2020   Chronic bilateral low back pain without sciatica 12/02/2020   Functional diarrhea    Migraine headache    Normocytic anemia    Recurrent UTI 08/06/2019   Esophagitis determined by endoscopy    AKI (acute kidney injury)    Generalized anxiety disorder 08/23/2015   Eczema 03/26/2012   Alcohol use disorder, mild, abuse 10/26/2010   Bipolar 1  disorder (HCC) 10/09/2010   Panic attacks 10/09/2010   Bilateral chronic knee pain 11/03/2009   Obesity 10/29/2008   Essential hypertension 12/20/2006    Orientation RESPIRATION BLADDER Height & Weight     Self, Situation, Place, Time  Normal Continent Weight: 82.1 kg Height:  5' 4 (162.6 cm)  BEHAVIORAL SYMPTOMS/MOOD NEUROLOGICAL BOWEL NUTRITION STATUS      Continent Diet (regular)  AMBULATORY STATUS COMMUNICATION OF NEEDS Skin   Extensive Assist Verbally Surgical wounds (Right ankle)                       Personal Care Assistance Level of Assistance  Bathing, Feeding, Dressing Bathing Assistance: Limited assistance Feeding assistance: Limited assistance Dressing Assistance: Limited assistance     Functional Limitations Info  Sight, Hearing, Speech Sight Info: Adequate Hearing Info: Adequate Speech Info: Adequate    SPECIAL CARE FACTORS FREQUENCY  PT (By licensed PT), OT (By licensed OT)     PT Frequency: 5x/wk OT Frequency: 5x/wk            Contractures Contractures Info: Not present    Additional Factors Info  Code Status, Allergies, Psychotropic Code Status Info: Full code Allergies Info: Imitrex (Sumatriptan), Banana Psychotropic Info: see MAR         Current Medications (04/04/2024):  This is the current hospital active medication list Current Facility-Administered Medications  Medication Dose Route Frequency Provider Last Rate Last Admin   acetaminophen  (TYLENOL ) tablet 325-650 mg  325-650 mg Oral Q6H PRN Sprague, Eleanor A, PA-C       bisacodyl  (DULCOLAX) suppository 10 mg  10 mg Rectal Daily PRN Sprague, Dickey A, PA-C       diphenhydrAMINE  (BENADRYL ) capsule 50 mg  50 mg Oral Q6H PRN Mdala-Gausi, Masiku Agatha, MD   50 mg at 04/04/24 0243   docusate sodium  (COLACE) capsule 100 mg  100 mg Oral BID Sprague, Eleanor A, PA-C   100 mg at 04/03/24 1201   enoxaparin  (LOVENOX ) injection 40 mg  40 mg Subcutaneous Q24H Sprague, Eleanor A, PA-C   40 mg  at 04/04/24 0831   lisinopril  (ZESTRIL ) tablet 10 mg  10 mg Oral Daily Sprague, Eleanor A, PA-C   10 mg at 04/04/24 9168   And   hydrochlorothiazide  (HYDRODIURIL ) tablet 12.5 mg  12.5 mg Oral Daily Sprague, Eleanor A, PA-C   12.5 mg at 04/04/24 0831   HYDROmorphone  (DILAUDID ) injection 0.5-1 mg  0.5-1 mg Intravenous Q4H PRN Sprague, Eleanor A, PA-C       hydrOXYzine  (ATARAX ) tablet 25 mg  25 mg Oral TID PRN Mdala-Gausi, Masiku Agatha, MD   25 mg at 04/04/24 0831   magnesium  citrate solution 1 Bottle  1 Bottle Oral Once PRN Sprague, Dickey A, PA-C       metoCLOPramide  (REGLAN ) tablet 5-10 mg  5-10 mg Oral Q8H PRN Sprague, Eleanor A, PA-C       Or   metoCLOPramide  (REGLAN ) injection 5-10 mg  5-10 mg Intravenous Q8H PRN Sprague, Eleanor A, PA-C       naproxen  (NAPROSYN ) tablet 250 mg  250 mg Oral BID WC Sprague, Eleanor A, PA-C   250 mg at 04/04/24 0831   ondansetron  (ZOFRAN ) tablet 4 mg  4 mg Oral Q6H PRN Sprague, Eleanor A, PA-C       Or   ondansetron  (ZOFRAN ) injection 4 mg  4 mg Intravenous Q6H PRN Sprague, Eleanor A, PA-C       oxyCODONE  (Oxy IR/ROXICODONE ) immediate release tablet 10-15 mg  10-15 mg Oral Q4H PRN Sprague, Eleanor A, PA-C   15 mg at 04/04/24 0831   oxyCODONE  (Oxy IR/ROXICODONE ) immediate release tablet 5-10 mg  5-10 mg Oral Q4H PRN Sprague, Eleanor A, PA-C       polyethylene glycol (MIRALAX  / GLYCOLAX ) packet 17 g  17 g Oral Daily PRN Sprague, Eleanor A, PA-C         Discharge Medications: Please see discharge summary for a list of discharge medications.  Relevant Imaging Results:  Relevant Lab Results:   Additional Information SSN: [id number]  Alfonse JONELLE Rex, RN     "

## 2024-04-04 NOTE — Progress Notes (Signed)
 Subjective: 1 Day Post-Op Procedures (LRB): OPEN REDUCTION INTERNAL FIXATION (ORIF) FOOT LISFRANC FRACTURE (Right) Patient reports pain as moderate.  Patient is currently eating lunch. Reports she has been able to tolerate liquids and solids. She has been able to defecate and urinate after surgery. She reports numbness in her toes. Denies n/v/f/c. OT has been by today.  She has a history of left ankle fracture a few months ago that has gone on to nonunion and hardware failure with posttraumatic arthritis of the ankle. She was originally on the operating room schedule for left ankle arthrodesis and hardware removal.  Objective: Vital signs in last 24 hours: Temp:  [98.3 F (36.8 C)-99.3 F (37.4 C)] 98.3 F (36.8 C) (01/23 1352) Pulse Rate:  [69-92] 69 (01/23 1352) Resp:  [16-19] 16 (01/23 1352) BP: (100-132)/(69-89) 100/69 (01/23 1352) SpO2:  [100 %] 100 % (01/23 1352)  Intake/Output from previous day: 01/22 0701 - 01/23 0700 In: 2797.7 [P.O.:920; I.V.:1777.7; IV Piggyback:100] Out: 1325 [Urine:1300; Blood:25] Intake/Output this shift: Total I/O In: 240 [P.O.:240] Out: -   Recent Labs    04/02/24 0921 04/03/24 1201 04/04/24 0346  HGB 12.8 11.1* 10.3*   Recent Labs    04/03/24 1201 04/04/24 0346  WBC 6.1 5.7  RBC 3.89 3.64*  HCT 33.9* 32.1*  PLT 114* 117*   Recent Labs    04/02/24 0921 04/03/24 1201 04/04/24 0346  NA 138  --  136  K 3.6  --  4.2  CL 102  --  103  CO2 22  --  24  BUN 15  --  13  CREATININE 1.05* 0.81 0.78  GLUCOSE 77  --  126*  CALCIUM  8.2*  --  8.7*   No results for input(s): LABPT, INR in the last 72 hours.  Subjectively diminished sensation of the right toes. Cap refill in toes is brisk Intact pulses distally Dorsiflexion/Plantar flexion intact in the toes  There is a well-padded short leg splint on her RLE. There is a tall CAM boot on her LLE.    Assessment/Plan: 1 Day Post-Op Procedures (LRB): OPEN REDUCTION INTERNAL FIXATION  (ORIF) FOOT LISFRANC FRACTURE (Right) Up with therapy Maintain NWB status on RLE, WBAT in the CAM boot for the LLE Continue Lovenox  for DVT prophylaxis Plan to discharge to skilled nursing when the patient is able     Kristin [name] A Kristin [name] 04/04/2024, 1:57 PM

## 2024-04-04 NOTE — Progress Notes (Addendum)
 " Progress Note   [name] FMW:[id number] DOB: [birth year] DOA: 04/02/2024     2 DOS: the patient was seen and examined on 04/04/2024    Brief hospital course: Kristin [name] is a 49 y.o. female with medical history significant of hypertension,  previous Takotsubo cardiomyopathy, bipolar disorder, and prior left ankle fracture s/p ORIF who presented with right ankle pain after a fall and was found to have a right ankle fracture (Lisfranc injury).  Orthopedics was consulted and patient underwent surgical treatment of her fractures/dislocations.  Postoperatively, orthopedics recommending nonweightbearing to the right lower extremity.  Assessment and Plan:  Right ankle fracture/right Lisfranc injury with associated medial malleolus fracture secondary to fall Acute.   Patient presented after slipping at home falling injuring her right ankle.   X-rays revealed minimally displaced fracture of the lateral malleolus, fracture of the medial base of the second metatarsal with widening of the first intermetatarsal space, no additional fracture involving the base of the third metatarsal along with the lateral corner of the cuboid suspected. Orthopedics was consulted. Patient underwent surgical treatment of her right foot ankle injury including management of multiple TMT dislocations and right ankle lateral malleolus fracture. - Postoperatively, orthopedics recommending nonweightbearing on right ankle and Lovenox  for DVT prophylaxis from postop day 1. - Patient evaluated by PT and SNF recommended.   Status post left ankle ORIF Patient was scheduled for a redo procedure of the left. This has been deferred in the setting of new right ankle fracture. - Orthopedics recommending weightbearing as tolerated to the left lower extremity in a cam boot  Pruritus Patient complains of severe itching, which has happened to her before in the postoperative period.  She does not seem to be responding to  hydroxyzine , Benadryl . - Decadron  10 mg x 1 ordered. - Twice daily Pepcid    History of Takotsubo cardiomyopathy Patient appears to be euvolemic on physical exam.   Last echocardiogram noted EF to be 60 to 65% with normal diastolic parameters when last checked 04/17/2023. - Will monitor   Hypertension Blood pressure in noted to be elevated up to 141/90. - Plan to resume lisinopril  hydrochlorothiazide  if BP high.    Tobacco abuse Patient reports smoking quarter of a pack cigarettes per day on average.   She declined nicotine  patch. - Counseled on the need of cessation of tobacco use      Subjective: Patient still complains of severe itching. Able to wiggle toes today  Physical Exam: BP 100/69 (BP Location: Left Arm)   Pulse 69   Temp 98.3 F (36.8 C)   Resp 16   Ht 5' 4 (1.626 m)   Wt 82.1 kg   LMP  (LMP Unknown)   SpO2 100%   BMI 31.07 kg/m     General: Alert, oriented X3  Eyes: Pupils equal, reactive  Oral cavity: moist mucous membranes  Head: Atraumatic, normocephalic  Neck: supple  Chest: clear to auscultation. No crackles, no wheezes  CVS: S1,S2 RRR. No murmurs  Abd: No distention, soft, non-tender. No masses palpable  MSK: Right lower extremity in splint, left lower extremity in cam boot Neurological: Grossly intact.    Data Reviewed:    Latest Ref Rng & Units 04/04/2024    3:46 AM 04/03/2024   12:01 PM 04/02/2024    9:21 AM  CBC  WBC 4.0 - 10.5 K/uL 5.7  6.1  7.8   Hemoglobin 12.0 - 15.0 g/dL 89.6  88.8  87.1   Hematocrit 36.0 -  46.0 % 32.1  33.9  38.7   Platelets 150 - 400 K/uL 117  114  164       Latest Ref Rng & Units 04/04/2024    3:46 AM 04/03/2024   12:01 PM 04/02/2024    9:21 AM  BMP  Glucose 70 - 99 mg/dL 873   77   BUN 6 - 20 mg/dL 13   15   Creatinine 9.55 - 1.00 mg/dL 9.21  9.18  8.94   Sodium 135 - 145 mmol/L 136   138   Potassium 3.5 - 5.1 mmol/L 4.2   3.6   Chloride 98 - 111 mmol/L 103   102   CO2 22 - 32 mmol/L 24   22   Calcium   8.9 - 10.3 mg/dL 8.7   8.2    Family Communication: n/a  Disposition: Status is: Inpatient  DVT PPx: SQ Lovenox       Author: MDALA-GAUSI, Shemicka Cohrs AGATHA, MD 04/04/2024 2:23 PM  For on call review [email or web address].    "

## 2024-04-04 NOTE — TOC Initial Note (Signed)
 Transition of Care [facility]) - Initial/Assessment Note    Patient Details  Name: Kristin [name] MRN: [id number] Date of Birth: May 10, 1975  Transition of [facility]) CM/SW Contact:    Alfonse JONELLE Rex, RN Phone Number: 04/04/2024, 1:58 PM  Clinical Narrative:      Met with patient at bedside to introduce role of INPT CM and review for dc planning, PT recommendation for short term rehab/SNF, pt agreeable, no preference. Patient reports she is currently homeless, stays with her daughter, god mother or sleeps in her care, states she and her daughter had a misunderstanding and she is no longer able to stay with her at this time. Home DME: RW, crutches. Patient states after SNF she plans to go stay with her godmother. FL2 updated, Level 2 PASRR pending, faxed out for bed offers.              Expected Discharge Plan: Skilled Nursing Facility Barriers to Discharge: Continued Medical Work up   Patient Goals and CMS Choice   CMS Medicare.gov Compare Post Acute Care list provided to:: Patient Choice offered to / list presented to : Patient [place] ownership interest in [facility].provided to:: Patient    Expected Discharge Plan and Services       Living arrangements for the past 2 months: Homeless                                      Prior Living Arrangements/Services Living arrangements for the past 2 months: Homeless   Patient language and need for interpreter reviewed:: Yes        Need for Family Participation in Patient Care: Yes (Comment) Care giver support system in place?: Yes (comment) Current home services: DME (crutches, RW) Criminal Activity/Legal Involvement Pertinent to Current Situation/Hospitalization: No - Comment as needed  Activities of Daily Living   ADL Screening (condition at time of admission) Independently performs ADLs?: Yes (appropriate for developmental age) Is the patient deaf or have difficulty hearing?: No Does the patient have  difficulty seeing, even when wearing glasses/contacts?: No Does the patient have difficulty concentrating, remembering, or making decisions?: No  Permission Sought/Granted                  Emotional Assessment Appearance:: Appears stated age Attitude/Demeanor/Rapport: Engaged Affect (typically observed): Accepting Orientation: : Oriented to Self, Oriented to Place, Oriented to  Time, Oriented to Situation Alcohol / Substance Use: Not Applicable Psych Involvement: No (comment)  Admission diagnosis:  Closed right ankle fracture [S82.891A] Closed fracture of right ankle, initial encounter [S82.891A] Acute right ankle pain [M25.571] Patient Active Problem List   Diagnosis Date Noted   Closed right ankle fracture 04/02/2024   Status post ORIF of fracture of ankle 04/02/2024   History of cardiomyopathy 04/02/2024   Tobacco abuse 04/02/2024   Bimalleolar fracture of left ankle 12/18/2023   Trichomonas infection 08/25/2023   Vaginal lesion 08/25/2023   History of recurrent UTIs 08/14/2023   Elevated ferritin level 08/01/2023   Cyclical vomiting syndrome not associated with migraine 04/19/2023   Gastroesophageal reflux disease 04/18/2023   Neuropathy 06/29/2022   Physical deconditioning 01/10/2022   Stress-induced cardiomyopathy    Acute HFrEF (heart failure with reduced ejection fraction) (HCC) 01/06/2022   Substance abuse (HCC) 01/06/2022   Spinal stenosis of lumbar region with neurogenic claudication 11/25/2021   Primary osteoarthritis of both knees 12/02/2020   Chronic bilateral low  back pain without sciatica 12/02/2020   Functional diarrhea    Migraine headache    Normocytic anemia    Recurrent UTI 08/06/2019   Esophagitis determined by endoscopy    AKI (acute kidney injury)    Generalized anxiety disorder 08/23/2015   Eczema 03/26/2012   Alcohol use disorder, mild, abuse 10/26/2010   Bipolar 1 disorder (HCC) 10/09/2010   Panic attacks 10/09/2010   Bilateral chronic  knee pain 11/03/2009   Obesity 10/29/2008   Essential hypertension 12/20/2006   PCP:  Toma Matas, MD Pharmacy:   [id number] [place], [place] - [address] AT [facility] OF [address] [place] [address] Phone: [phone] Fax: [phone]  [facility] - [place], [place] - One [facility] AT [facility] to [facility] [address] [place] [address] Phone: [phone] Fax: [phone]  [id number] [place], [place] - [address] AT [facility] [address] [place] [address] Phone: [phone] Fax: [phone]  [facility] [address] [place] [address] Phone: [phone] Fax: [phone]  [facility] [address] [place] [address] Phone: [phone] Fax: [phone]     Social Drivers of Health (SDOH) Social History: SDOH Screenings   Food Insecurity: Food Insecurity Present (04/02/2024)  Housing: High Risk (04/02/2024)  Transportation Needs: No Transportation Needs (04/02/2024)  Utilities: At Risk (04/02/2024)  Depression (PHQ2-9): High Risk (08/24/2023)  Social Connections: Unknown (12/19/2021)   Received from [facility]  Tobacco Use: High Risk (04/03/2024)   SDOH Interventions: Food Insecurity Interventions: [employer] Provided, Inpatient TOC Housing Interventions: [employer] Provided, Inpatient TOC Utilities Interventions: [facility] [employer] Provided, Inpatient TOC   Readmission Risk Interventions    04/04/2024    1:57 PM 12/19/2023    3:38 PM  Readmission Risk Prevention Plan  Transportation Screening Complete Complete  PCP or Specialist Appt within 5-7 Days Complete Complete  Home Care Screening Complete Complete  Medication Review (RN CM) Complete Complete

## 2024-04-04 NOTE — Evaluation (Signed)
 Occupational Therapy Evaluation Patient Details Name: Kristin [name] MRN: [id number] DOB: [birth year] Today's Date: 04/04/2024   History of Present Illness   49 y.o. female with medical history significant of hypertension, previous Takotsubo cardiomyopathy, bipolar disorder, and prior Left ankle ORIF and primary repair of deltoid ligament on 12/18/23 who presented with right ankle pain after a fall and was found to have a right ankle fracture (Lisfranc injury) and underwent surgical treatment of right ankle injury on 04/03/24.     Clinical Impressions Prior to this admission, patient was in CAM boot for her L ankle, and reporting poor adherence to weight bearing precautions during PT evaluation. 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 Currently, patient is demonstrating difficulty with NWB on RLE, and requiring increased assist to complete stand pivot transfers. Patient mod A for ADL management, and mod A of 2 to complete step pivot to recliner. Patient with improved NWB adherence on RLE upon second attempt, and able to complete incremental hop/pivots on L CAM boot. Given current level, OT is recommending short term rehab < 3 hours prior to discharge home. OT will continue to follow acutely.      If plan is discharge home, recommend the following:   Two people to help with walking and/or transfers;A lot of help with bathing/dressing/bathroom;Assist for transportation;Help with stairs or ramp for entrance     Functional Status Assessment   Patient has had a recent decline in their functional status and demonstrates the ability to make significant improvements in function in a reasonable and predictable amount of time.     Equipment Recommendations   Other (comment) (defer to next venue)     Recommendations for Other Services         Precautions/Restrictions   Precautions Precautions: Fall Recall of Precautions/Restrictions:  Intact Restrictions Weight Bearing Restrictions Per Provider Order: Yes RLE Weight Bearing Per Provider Order: Non weight bearing LLE Weight Bearing Per Provider Order: Weight bearing as tolerated     Mobility Bed Mobility Overal bed mobility: Needs Assistance Bed Mobility: Supine to Sit     Supine to sit: Mod assist     General bed mobility comments: assist to bring RLE off of bed    Transfers Overall transfer level: Needs assistance Equipment used: Rolling walker (2 wheels) Transfers: Sit to/from Stand, Bed to chair/wheelchair/BSC Sit to Stand: Mod assist, +2 physical assistance, +2 safety/equipment Stand pivot transfers: Mod assist, +2 physical assistance, +2 safety/equipment         General transfer comment: cues for RLE NWB, but improved on second sit<>stand when bed was elevated, mod A of 2 to complete, and mod A of 2 to complete incremental step/hops to the L      Balance Overall balance assessment: Needs assistance Sitting-balance support: Bilateral upper extremity supported, Feet supported Sitting balance-Leahy Scale: Fair Sitting balance - Comments: did not challenge   Standing balance support: During functional activity, Bilateral upper extremity supported, Reliant on assistive device for balance Standing balance-Leahy Scale: Poor Standing balance comment: reliant on RW                           ADL either performed or assessed with clinical judgement   ADL Overall ADL's : Needs assistance/impaired Eating/Feeding: Set up;Sitting   Grooming: Set up;Sitting   Upper Body Bathing: Contact guard assist;Sitting   Lower Body Bathing: Moderate assistance;Sit to/from stand;Sitting/lateral leans   Upper Body Dressing : Contact  guard assist;Sitting   Lower Body Dressing: Moderate assistance;Sitting/lateral leans;Sit to/from stand   Toilet Transfer: Maximal assistance;+2 for physical assistance;+2 for safety/equipment;Stand-pivot;BSC/3in1;Rolling  walker (2 wheels) Toilet Transfer Details (indicate cue type and reason): simulated with transfer to the recliner Toileting- Clothing Manipulation and Hygiene: Sitting/lateral lean;Sit to/from stand;Minimal assistance Toileting - Clothing Manipulation Details (indicate cue type and reason): for thoroughness     Functional mobility during ADLs: Moderate assistance;+2 for safety/equipment;+2 for physical assistance;Cueing for safety;Cueing for sequencing;Rolling walker (2 wheels) General ADL Comments: Prior to this admission, patient was in CAM boot for her L ankle, and reporting poor adherence to weight bearing precautions during PT evaluation. 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 Currently, patient is demonstrating difficulty with NWB on RLE, and requiring increased assist to complete stand pivot transfers. Patient mod A for ADL management, and mod A of 2 to complete step pivot to recliner. Patient with improved NWB adherence on RLE upon second attempt, and able to complete incremental hop/pivots on L CAM boot. Given current level, OT is recommending short term rehab < 3 hours prior to discharge home. OT will continue to follow acutely.     Vision Baseline Vision/History: 0 No visual deficits Ability to See in Adequate Light: 0 Adequate Patient Visual Report: No change from baseline Vision Assessment?: No apparent visual deficits     Perception Perception: Not tested       Praxis Praxis: Not tested       Pertinent Vitals/Pain Pain Assessment Pain Assessment: Faces Faces Pain Scale: Hurts little more Pain Location: right ankle Pain Descriptors / Indicators: Discomfort, Grimacing, Guarding Pain Intervention(s): Limited activity within patient's tolerance, Monitored during session, Repositioned     Extremity/Trunk Assessment Upper Extremity Assessment Upper Extremity Assessment: Overall WFL for tasks assessed;Right hand dominant   Lower  Extremity Assessment Lower Extremity Assessment: Defer to PT evaluation   Cervical / Trunk Assessment Cervical / Trunk Assessment: Normal   Communication Communication Communication: No apparent difficulties   Cognition Arousal: Alert Behavior During Therapy: WFL for tasks assessed/performed Cognition: No apparent impairments                               Following commands: Intact       Cueing  General Comments   Cueing Techniques: Verbal cues      Exercises     Shoulder Instructions      Home Living Family/patient expects to be discharged to:: Private residence Living Arrangements: Children Available Help at Discharge: Family;Available 24 hours/day Type of Home: House Home Access: Stairs to enter [employer] of Steps: 5 Entrance Stairs-Rails: Right;Left;Can reach both Home Layout: Multi-level;Bed/bath upstairs     Bathroom Shower/Tub: Producer, Television/film/video: Standard     Home Equipment: Cane - single point          Prior Functioning/Environment Prior Level of Function : Independent/Modified Independent;Working/employed;Driving             Mobility Comments: reports poor adherence to weight bearing status of previous left ankle repair ADLs Comments: Ind with ADLs, IADLs, 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)    OT Problem List: Decreased strength;Decreased range of motion;Impaired balance (sitting and/or standing);Decreased coordination;Decreased safety awareness;Decreased knowledge of precautions;Pain   OT Treatment/Interventions: Self-care/ADL training;Therapeutic exercise;Energy conservation;DME and/or AE instruction;Manual therapy;Therapeutic activities;Patient/family education;Balance training      OT Goals(Current goals can be found in  the care plan section)   Acute Rehab OT Goals Patient Stated Goal: to get better OT Goal Formulation: With patient Time For Goal Achievement:  04/18/24 Potential to Achieve Goals: Good   OT Frequency:  Min 2X/week    Co-evaluation              AM-PAC OT 6 Clicks Daily Activity     Outcome Measure Help from another person eating meals?: A Little Help from another person taking care of personal grooming?: A Little Help from another person toileting, which includes using toliet, bedpan, or urinal?: A Lot Help from another person bathing (including washing, rinsing, drying)?: A Lot Help from another person to put on and taking off regular upper body clothing?: A Little Help from another person to put on and taking off regular lower body clothing?: A Lot 6 Click Score: 15   End of Session Equipment Utilized During Treatment: Gait belt;Rolling walker (2 wheels);Other (comment) (CAM boot) Nurse Communication: Mobility status  Activity Tolerance: Patient limited by pain Patient left: with call bell/phone within reach;in chair;with chair alarm set  OT Visit Diagnosis: Unsteadiness on feet (R26.81);Other abnormalities of gait and mobility (R26.89);Muscle weakness (generalized) (M62.81);History of falling (Z91.81);Repeated falls (R29.6);Pain Pain - Right/Left: Right Pain - part of body: Leg                Time: 8843-8787 OT Time Calculation (min): 16 min Charges:  OT General Charges $OT Visit: 1 Visit OT Evaluation $OT Eval Moderate Complexity: 1 Mod  Ronal Gift E. Gerrard Crystal, OTR/L Acute Rehabilitation Services [phone]   Ronal Gift Salt 04/04/2024, 12:38 PM

## 2024-04-04 NOTE — TOC PASRR Note (Signed)
 30 Day PASRR Note   Patient Details  Name: Kristin [name] Date of Birth: 16-Apr-1975   Transition of Care [facility]) CM/SW Contact:    Alfonse JONELLE Rex, RN Phone Number: 04/04/2024, 12:27 PM  To Whom It May Concern:  Please be advised that this patient will require a short-term nursing home stay - anticipated 30 days or less for rehabilitation and strengthening.   The plan is for return home.

## 2024-04-05 DIAGNOSIS — S82891A Other fracture of right lower leg, initial encounter for closed fracture: Secondary | ICD-10-CM | POA: Diagnosis not present

## 2024-04-05 LAB — CBC
HCT: 30.4 % — ABNORMAL LOW (ref 36.0–46.0)
Hemoglobin: 10.1 g/dL — ABNORMAL LOW (ref 12.0–15.0)
MCH: 28.9 pg (ref 26.0–34.0)
MCHC: 33.2 g/dL (ref 30.0–36.0)
MCV: 86.9 fL (ref 80.0–100.0)
Platelets: 154 10*3/uL (ref 150–400)
RBC: 3.5 MIL/uL — ABNORMAL LOW (ref 3.87–5.11)
RDW: 17.8 % — ABNORMAL HIGH (ref 11.5–15.5)
WBC: 6.7 10*3/uL (ref 4.0–10.5)
nRBC: 0 % (ref 0.0–0.2)

## 2024-04-05 MED ORDER — DEXAMETHASONE SOD PHOSPHATE PF 10 MG/ML IJ SOLN
10.0000 mg | Freq: Once | INTRAMUSCULAR | Status: AC
  Administered 2024-04-06: 10 mg via INTRAVENOUS
  Filled 2024-04-05: qty 1

## 2024-04-05 NOTE — Progress Notes (Signed)
 Physical Therapy Treatment Patient Details Name: Kristin [name] MRN: [id number] DOB: [birth year] Today's Date: 04/05/2024   History of Present Illness 49 y.o. female with medical history significant of hypertension, previous Takotsubo cardiomyopathy, bipolar disorder, and prior Left ankle ORIF and primary repair of deltoid ligament on 12/18/23 who presented with right ankle pain after a fall and was found to have a right ankle fracture (Lisfranc injury) and underwent surgical treatment of right ankle injury on 04/03/24.    PT Comments  Due to pt's difficulty maintaining NWB, educated pt on lateral/scoot transfer. Pt required less assist and better able to maintain NWB.  Will attempt to find/bring drop arm BSC to room for pt to utilize during admission.  Patient will benefit from continued inpatient follow up therapy, <3 hours/day    If plan is discharge home, recommend the following: A lot of help with walking and/or transfers;A lot of help with bathing/dressing/bathroom;Assistance with cooking/housework;Assist for transportation;Help with stairs or ramp for entrance   Can travel by private vehicle        Equipment Recommendations  Wheelchair (measurements PT);Wheelchair cushion (measurements PT)    Recommendations for Other Services       Precautions / Restrictions Precautions Precautions: Fall Recall of Precautions/Restrictions: Intact Restrictions Weight Bearing Restrictions Per Provider Order: Yes RLE Weight Bearing Per Provider Order: Non weight bearing LLE Weight Bearing Per Provider Order: Weight bearing as tolerated (with CAM boot)     Mobility  Bed Mobility Overal bed mobility: Needs Assistance Bed Mobility: Supine to Sit     Supine to sit: Supervision     General bed mobility comments: pt self assisted LEs over EOB    Transfers Overall transfer level: Needs assistance Equipment used: None Transfers: Bed to chair/wheelchair/BSC            Lateral/Scoot  Transfers: Contact guard assist, From elevated surface General transfer comment: performed lateral scoot transfer from bed to drop arm recliner with mod cues for RLE NWB which pt was better able to maintain    Ambulation/Gait                   Stairs             Wheelchair Mobility     Tilt Bed    Modified Rankin (Stroke Patients Only)       Balance Overall balance assessment: Needs assistance Sitting-balance support: Feet supported Sitting balance-Leahy Scale: Good Sitting balance - Comments: pt able to reach down and doff CAM boot (preparing for bathing)                                    Communication Communication Communication: No apparent difficulties  Cognition Arousal: Alert Behavior During Therapy: WFL for tasks assessed/performed   PT - Cognitive impairments: Safety/Judgement                         Following commands: Intact      Cueing Cueing Techniques: Verbal cues  Exercises      General Comments        Pertinent Vitals/Pain Pain Assessment Pain Assessment: Faces Faces Pain Scale: Hurts a little bit Pain Location: right ankle Pain Descriptors / Indicators: Discomfort, Guarding Pain Intervention(s): Monitored during session, Repositioned    Home Living  Prior Function            PT Goals (current goals can now be found in the care plan section) Progress towards PT goals: Progressing toward goals    Frequency    Min 3X/week      PT Plan      Co-evaluation              AM-PAC PT 6 Clicks Mobility   Outcome Measure  Help needed turning from your back to your side while in a flat bed without using bedrails?: A Little Help needed moving from lying on your back to sitting on the side of a flat bed without using bedrails?: A Little Help needed moving to and from a bed to a chair (including a wheelchair)?: A Lot Help needed standing up from a chair  using your arms (e.g., wheelchair or bedside chair)?: A Lot Help needed to walk in hospital room?: Total Help needed climbing 3-5 steps with a railing? : Total 6 Click Score: 12    End of Session   Activity Tolerance: Patient tolerated treatment well Patient left: in chair;with call bell/phone within reach;with chair alarm set Nurse Communication: Mobility status PT Visit Diagnosis: Muscle weakness (generalized) (M62.81);Unsteadiness on feet (R26.81);Other abnormalities of gait and mobility (R26.89)     Time: 8799-8783 PT Time Calculation (min) (ACUTE ONLY): 16 min  Charges:    $Therapeutic Activity: 8-22 mins PT General Charges $$ ACUTE PT VISIT: 1 Visit                     Kristin PT, DPT Physical Therapist Acute Rehabilitation Services Office: [phone]    Kristin [name] 04/05/2024, 1:26 PM

## 2024-04-05 NOTE — Progress Notes (Signed)
 Patient is a high fall risk and noncompliant with safety measures, as well as, weight bearing restrictions. Patient has been educated multiple times about using call bell for help and continues to get up unsupervised. Weight bearing restrictions ordered by orthopedic surgeon for patient to be non weight bearing on right lower extremity and weight bearing as tolerated with CAM boot on left lower extremity. Patient found walking in room putting weight on right lower extremity. Educated patient on the importance of keeping weight off of her right leg. Patient expressed that she is not in a good mood and to not mess with her today because she can switch and be a real bitch. Asked patient if there was anything else she needed, turned on bed and chair alarms, and advised her to call for help. Notified attending MD and on call ortho provider.

## 2024-04-05 NOTE — Progress Notes (Signed)
 " Progress Note   [name] FMW:[id number] DOB: 1976/02/23 DOA: 04/02/2024     3 DOS: the patient was seen and examined on 04/05/2024    Brief hospital course: Kristin [name] is a 49 y.o. female with medical history significant of hypertension,  previous Takotsubo cardiomyopathy, bipolar disorder, and prior left ankle fracture s/p ORIF who presented with right ankle pain after a fall and was found to have a right ankle fracture (Lisfranc injury).  Orthopedics was consulted and patient underwent surgical treatment of her fractures/dislocations.  Postoperatively, orthopedics recommending nonweightbearing to the right lower extremity.  Assessment and Plan:  Right ankle fracture/right Lisfranc injury with associated medial malleolus fracture secondary to fall Acute.   Patient presented after slipping at home falling injuring her right ankle.   X-rays revealed minimally displaced fracture of the lateral malleolus, fracture of the medial base of the second metatarsal with widening of the first intermetatarsal space, no additional fracture involving the base of the third metatarsal along with the lateral corner of the cuboid suspected. Orthopedics was consulted. Patient underwent surgical treatment of her right foot ankle injury including management of multiple TMT dislocations and right ankle lateral malleolus fracture. - Postoperatively, orthopedics recommending nonweightbearing on right ankle and Lovenox  for DVT prophylaxis from postop day 1. - Patient evaluated by PT and SNF recommended. Placement is pending.   Status post left ankle ORIF Patient was scheduled for a redo procedure of the left. This has been deferred in the setting of new right ankle fracture. - Orthopedics recommending weightbearing as tolerated to the left lower extremity in a cam boot  Pruritus Patient complains of severe itching, which has happened to her before in the postoperative period.  She does not seem  to be responding to hydroxyzine , Benadryl . She received Decadron  yesterday and reports she slept a little better last night. Will repeat Decadron  today. - Continue twice daily Pepcid , hydroxyzine , Benadryl . - Will need to evaluate pruritus further if it persists.  History of Takotsubo cardiomyopathy Patient appears to be euvolemic on physical exam.   Last echocardiogram noted EF to be 60 to 65% with normal diastolic parameters when last checked 04/17/2023. - Will monitor   Hypertension - Continue home lisinopril  hydrochlorothiazide .   Tobacco abuse Patient reports smoking quarter of a pack cigarettes per day on average.   She declined nicotine  patch. - Counseled on the need of cessation of tobacco use      Subjective: Patient still complains of severe itching.  Slept a little better last night, but itching still very troublesome.  Physical Exam: BP 121/86 (BP Location: Left Arm)   Pulse 65   Temp 97.8 F (36.6 C) (Oral)   Resp 18   Ht 5' 4 (1.626 m)   Wt 82.1 kg   LMP  (LMP Unknown)   SpO2 100%   BMI 31.07 kg/m     General: Alert, oriented X3  Eyes: Pupils equal, reactive  Oral cavity: moist mucous membranes  Head: Atraumatic, normocephalic  Neck: supple  Chest: clear to auscultation. No crackles, no wheezes  CVS: S1,S2 RRR. No murmurs  Abd: No distention, soft, non-tender. No masses palpable  MSK: Right lower extremity in splint, left lower extremity in cam boot Neurological: Grossly intact.    Data Reviewed:    Latest Ref Rng & Units 04/05/2024    3:22 AM 04/04/2024    3:46 AM 04/03/2024   12:01 PM  CBC  WBC 4.0 - 10.5 K/uL 6.7  5.7  6.1   Hemoglobin 12.0 - 15.0 g/dL 89.8  89.6  88.8   Hematocrit 36.0 - 46.0 % 30.4  32.1  33.9   Platelets 150 - 400 K/uL 154  117  114       Latest Ref Rng & Units 04/04/2024    3:46 AM 04/03/2024   12:01 PM 04/02/2024    9:21 AM  BMP  Glucose 70 - 99 mg/dL 873   77   BUN 6 - 20 mg/dL 13   15   Creatinine 9.55 - 1.00  mg/dL 9.21  9.18  8.94   Sodium 135 - 145 mmol/L 136   138   Potassium 3.5 - 5.1 mmol/L 4.2   3.6   Chloride 98 - 111 mmol/L 103   102   CO2 22 - 32 mmol/L 24   22   Calcium  8.9 - 10.3 mg/dL 8.7   8.2    Family Communication: n/a  Disposition: Status is: Inpatient  DVT PPx: SQ Lovenox       Author: MDALA-GAUSI, Ferd Horrigan AGATHA, MD 04/05/2024 2:30 PM  For on call review [email or web address]    "

## 2024-04-05 NOTE — Progress Notes (Signed)
" ° °  Subjective: 2 Days Post-Op Procedures (LRB): OPEN REDUCTION INTERNAL FIXATION (ORIF) FOOT LISFRANC FRACTURE (Right)  Pt sitting in bed in no acute distress Pain is mild to moderate at times Difficulty using the walker and remaining non weight bearing Patient reports pain as moderate.  Objective:   VITALS:   Vitals:   04/04/24 2053 04/05/24 0520  BP: (!) 138/93 121/86  Pulse: 76 65  Resp: 16 18  Temp: 99.5 F (37.5 C) 97.8 F (36.6 C)  SpO2: 100% 100%    Right lower leg: splint in place Nv intact distally No rashes or edema No signs of infection or dvt   LABS Recent Labs    04/03/24 1201 04/04/24 0346 04/05/24 0322  HGB 11.1* 10.3* 10.1*  HCT 33.9* 32.1* 30.4*  WBC 6.1 5.7 6.7  PLT 114* 117* 154    Recent Labs    04/02/24 0921 04/03/24 1201 04/04/24 0346  NA 138  --  136  K 3.6  --  4.2  BUN 15  --  13  CREATININE 1.05* 0.81 0.78  GLUCOSE 77  --  126*     Assessment/Plan: 2 Days Post-Op Procedures (LRB): OPEN REDUCTION INTERNAL FIXATION (ORIF) FOOT LISFRANC FRACTURE (Right) Continue PT/OT and non weight bearing D/c planning to suspected SNF Pain management per medical team Will continue to monitor her progress   Arvella Melvenia RIGGERS, MPAS [facility] is now [facility] [address], [place], [place] [address] Phone: [phone] [email or web address] Facebook  [employer]      "

## 2024-04-05 NOTE — Plan of Care (Signed)
  Problem: Activity: Goal: Risk for activity intolerance will decrease Outcome: Progressing   Problem: Coping: Goal: Level of anxiety will decrease Outcome: Progressing   Problem: Pain Managment: Goal: General experience of comfort will improve and/or be controlled Outcome: Progressing   Problem: Safety: Goal: Ability to remain free from injury will improve Outcome: Progressing

## 2024-04-06 DIAGNOSIS — S82891A Other fracture of right lower leg, initial encounter for closed fracture: Secondary | ICD-10-CM | POA: Diagnosis not present

## 2024-04-06 LAB — CBC WITH DIFFERENTIAL/PLATELET
Abs Immature Granulocytes: 0.02 10*3/uL (ref 0.00–0.07)
Basophils Absolute: 0.1 10*3/uL (ref 0.0–0.1)
Basophils Relative: 1 %
Eosinophils Absolute: 0.2 10*3/uL (ref 0.0–0.5)
Eosinophils Relative: 4 %
HCT: 32.1 % — ABNORMAL LOW (ref 36.0–46.0)
Hemoglobin: 10.2 g/dL — ABNORMAL LOW (ref 12.0–15.0)
Immature Granulocytes: 0 %
Lymphocytes Relative: 16 %
Lymphs Abs: 1.1 10*3/uL (ref 0.7–4.0)
MCH: 28.5 pg (ref 26.0–34.0)
MCHC: 31.8 g/dL (ref 30.0–36.0)
MCV: 89.7 fL (ref 80.0–100.0)
Monocytes Absolute: 0.7 10*3/uL (ref 0.1–1.0)
Monocytes Relative: 10 %
Neutro Abs: 4.9 10*3/uL (ref 1.7–7.7)
Neutrophils Relative %: 69 %
Platelets: 168 10*3/uL (ref 150–400)
RBC: 3.58 MIL/uL — ABNORMAL LOW (ref 3.87–5.11)
RDW: 17.9 % — ABNORMAL HIGH (ref 11.5–15.5)
WBC: 7 10*3/uL (ref 4.0–10.5)
nRBC: 0 % (ref 0.0–0.2)

## 2024-04-06 LAB — COMPREHENSIVE METABOLIC PANEL WITH GFR
ALT: 10 U/L (ref 0–44)
AST: 28 U/L (ref 15–41)
Albumin: 3.6 g/dL (ref 3.5–5.0)
Alkaline Phosphatase: 113 U/L (ref 38–126)
Anion gap: 9 (ref 5–15)
BUN: 12 mg/dL (ref 6–20)
CO2: 25 mmol/L (ref 22–32)
Calcium: 8.8 mg/dL — ABNORMAL LOW (ref 8.9–10.3)
Chloride: 102 mmol/L (ref 98–111)
Creatinine, Ser: 0.76 mg/dL (ref 0.44–1.00)
GFR, Estimated: >60 mL/min
Glucose, Bld: 114 mg/dL — ABNORMAL HIGH (ref 70–99)
Potassium: 4.3 mmol/L (ref 3.5–5.1)
Sodium: 136 mmol/L (ref 135–145)
Total Bilirubin: 0.4 mg/dL (ref 0.0–1.2)
Total Protein: 6.3 g/dL — ABNORMAL LOW (ref 6.5–8.1)

## 2024-04-06 MED ORDER — FAMOTIDINE 20 MG PO TABS
20.0000 mg | ORAL_TABLET | Freq: Two times a day (BID) | ORAL | Status: DC
  Administered 2024-04-06 – 2024-04-16 (×20): 20 mg via ORAL
  Filled 2024-04-06 (×20): qty 1

## 2024-04-06 MED ORDER — ASPIRIN 81 MG PO TBEC
81.0000 mg | DELAYED_RELEASE_TABLET | Freq: Two times a day (BID) | ORAL | Status: DC
  Administered 2024-04-06 – 2024-04-16 (×21): 81 mg via ORAL
  Filled 2024-04-06 (×21): qty 1

## 2024-04-06 NOTE — Progress Notes (Signed)
 " Progress Note   [name] FMW:[id number] DOB: 12-03-1975 DOA: 04/02/2024     4 DOS: the patient was seen and examined on 04/06/2024    Brief hospital course: Kristin [name] is a 49 y.o. female with medical history significant of hypertension,  previous Takotsubo cardiomyopathy, bipolar disorder, and prior left ankle fracture s/p ORIF who presented with right ankle pain after a fall and was found to have a right ankle fracture (Lisfranc injury).  Orthopedics was consulted and patient underwent surgical treatment of her fractures/dislocations.  Postoperatively, orthopedics recommending nonweightbearing to the right lower extremity.  Assessment and Plan:  Right ankle fracture/right Lisfranc injury with associated medial malleolus fracture secondary to fall Acute.   Patient presented after slipping at home falling injuring her right ankle.   X-rays revealed minimally displaced fracture of the lateral malleolus, fracture of the medial base of the second metatarsal with widening of the first intermetatarsal space, no additional fracture involving the base of the third metatarsal along with the lateral corner of the cuboid suspected. Orthopedics was consulted. Patient underwent surgical treatment of her right foot ankle injury including management of multiple TMT dislocations and right ankle lateral malleolus fracture. - Postoperatively, orthopedics recommending nonweightbearing on right ankle and Lovenox  for DVT prophylaxis from postop day 1. - Patient requests she be given an alternative agent (other than Lovenox ) for DVT prophylaxis.  Started on aspirin  81 mg twice daily. - Patient evaluated by PT and SNF recommended. Placement is pending.   Status post left ankle ORIF Patient was scheduled for a redo procedure of the left. This has been deferred in the setting of new right ankle fracture. - Orthopedics recommending weightbearing as tolerated to the left lower extremity in a cam  boot  Pruritus Patient complains of severe itching, which has happened to her before in the postoperative period.  Patient states this is improving. S/p Decadron , Pepcid . Noted that it is likely related to her oxycodone  use. - Continue as needed hydroxyzine , Benadryl . - Minimize use of oxycodone  if possible.  History of Takotsubo cardiomyopathy Patient appears to be euvolemic on physical exam.   Last echocardiogram noted EF to be 60 to 65% with normal diastolic parameters when last checked 04/17/2023. - Will monitor   Hypertension - Continue home lisinopril -hydrochlorothiazide .   Tobacco abuse Patient reports smoking quarter of a pack cigarettes per day on average.   She declined nicotine  patch. - Counseled on the need of cessation of tobacco use      Subjective: Patient says itching is improving. Believes it is exacerbated by oxycodone .   Physical Exam: BP (!) 147/93 (BP Location: Left Arm)   Pulse 72   Temp 97.7 F (36.5 C) (Oral)   Resp 16   Ht 5' 4 (1.626 m)   Wt 82.1 kg   LMP  (LMP Unknown)   SpO2 100%   BMI 31.07 kg/m     General: Alert, oriented X3  Eyes: Pupils equal, reactive  Oral cavity: moist mucous membranes  Head: Atraumatic, normocephalic  Neck: supple  Chest: clear to auscultation. No crackles, no wheezes  CVS: S1,S2 RRR. No murmurs  Abd: No distention, soft, non-tender. No masses palpable  MSK: Right lower extremity in splint, left lower extremity in cam boot Neurological: Grossly intact.    Data Reviewed:    Latest Ref Rng & Units 04/06/2024    3:23 AM 04/05/2024    3:22 AM 04/04/2024    3:46 AM  CBC  WBC 4.0 - 10.5  K/uL 7.0  6.7  5.7   Hemoglobin 12.0 - 15.0 g/dL 89.7  89.8  89.6   Hematocrit 36.0 - 46.0 % 32.1  30.4  32.1   Platelets 150 - 400 K/uL 168  154  117       Latest Ref Rng & Units 04/06/2024    3:23 AM 04/04/2024    3:46 AM 04/03/2024   12:01 PM  BMP  Glucose 70 - 99 mg/dL 885  873    BUN 6 - 20 mg/dL 12  13     Creatinine 9.55 - 1.00 mg/dL 9.23  9.21  9.18   Sodium 135 - 145 mmol/L 136  136    Potassium 3.5 - 5.1 mmol/L 4.3  4.2    Chloride 98 - 111 mmol/L 102  103    CO2 22 - 32 mmol/L 25  24    Calcium  8.9 - 10.3 mg/dL 8.8  8.7     Family Communication: n/a  Disposition: Status is: Inpatient  DVT PPx: ASA 81 mg BID       Author: MDALA-GAUSI, Angell Honse AGATHA, MD 04/06/2024 12:50 PM  For on call review [email or web address]    "

## 2024-04-07 DIAGNOSIS — S82891A Other fracture of right lower leg, initial encounter for closed fracture: Secondary | ICD-10-CM | POA: Diagnosis not present

## 2024-04-07 LAB — URINALYSIS, ROUTINE W REFLEX MICROSCOPIC
Bilirubin Urine: NEGATIVE
Glucose, UA: NEGATIVE mg/dL
Ketones, ur: NEGATIVE mg/dL
Nitrite: POSITIVE — AB
Protein, ur: 30 mg/dL — AB
Specific Gravity, Urine: 1.019 (ref 1.005–1.030)
WBC, UA: >50 WBC/hpf (ref 0–5)
pH: 5 (ref 5.0–8.0)

## 2024-04-07 MED ORDER — OXYCODONE HCL 5 MG PO TABS
5.0000 mg | ORAL_TABLET | Freq: Four times a day (QID) | ORAL | Status: DC | PRN
  Administered 2024-04-08 – 2024-04-16 (×23): 5 mg via ORAL
  Filled 2024-04-07 (×23): qty 1

## 2024-04-07 NOTE — Plan of Care (Signed)
   Problem: Coping: Goal: Level of anxiety will decrease Outcome: Progressing   Problem: Pain Managment: Goal: General experience of comfort will improve and/or be controlled Outcome: Progressing   Problem: Safety: Goal: Ability to remain free from injury will improve Outcome: Progressing

## 2024-04-07 NOTE — Progress Notes (Signed)
 " Progress Note   [name] FMW:[id number] DOB: 03-14-1975 DOA: 04/02/2024     5 DOS: the patient was seen and examined on 04/07/2024    Brief hospital course: Kristin [name] is a 49 y.o. female with medical history significant of hypertension,  previous Takotsubo cardiomyopathy, bipolar disorder, and prior left ankle fracture s/p ORIF who presented with right ankle pain after a fall and was found to have a right ankle fracture (Lisfranc injury).  Orthopedics was consulted and patient underwent surgical treatment of her fractures/dislocations.  Postoperatively, orthopedics recommending nonweightbearing to the right lower extremity.  Assessment and Plan:  Possible UTI - Check UA. -Will hold off antibiotics until UA done.  Right ankle fracture/right Lisfranc injury with associated medial malleolus fracture secondary to fall Acute.   Patient presented after slipping at home falling injuring her right ankle.   X-rays revealed minimally displaced fracture of the lateral malleolus, fracture of the medial base of the second metatarsal with widening of the first intermetatarsal space, no additional fracture involving the base of the third metatarsal along with the lateral corner of the cuboid suspected. Orthopedics was consulted. Patient underwent surgical treatment of her right foot ankle injury including management of multiple TMT dislocations and right ankle lateral malleolus fracture. - Postoperatively, orthopedics recommending nonweightbearing on right ankle and Lovenox  for DVT prophylaxis from postop day 1. - Patient requests she be given an alternative agent (other than Lovenox ) for DVT prophylaxis.  Started on aspirin  81 mg twice daily. - Patient evaluated by PT and SNF recommended. Placement is pending.   Status post left ankle ORIF Patient was scheduled for a redo procedure of the left. This has been deferred in the setting of new right ankle fracture. - Orthopedics  recommending weightbearing as tolerated to the left lower extremity in a cam boot  Pruritus Patient complains of severe itching, which has happened to her before in the postoperative period.  Patient states this is improving. S/p Decadron , Pepcid . Noted that it is likely related to her oxycodone  use. - Continue as needed hydroxyzine , Benadryl . - Minimize use of oxycodone  if possible.  History of Takotsubo cardiomyopathy Patient appears to be euvolemic on physical exam.   Last echocardiogram noted EF to be 60 to 65% with normal diastolic parameters when last checked 04/17/2023. - Will monitor   Hypertension - Continue home lisinopril -hydrochlorothiazide .   Tobacco abuse Patient reports smoking quarter of a pack cigarettes per day on average.   She declined nicotine  patch. - Counseled on the need of cessation of tobacco use      Subjective: Patient complains of back pain.  States she feels she has a UTI.  Physical Exam: BP (!) 132/90 (BP Location: Right Arm)   Pulse 63   Temp 98.6 F (37 C) (Oral)   Resp 16   Ht 5' 4 (1.626 m)   Wt 82.1 kg   LMP  (LMP Unknown)   SpO2 100%   BMI 31.07 kg/m     General: Alert, oriented X3  Eyes: Pupils equal, reactive  Oral cavity: moist mucous membranes  Head: Atraumatic, normocephalic  Neck: supple  Chest: clear to auscultation. No crackles, no wheezes  CVS: S1,S2 RRR. No murmurs  Abd: No distention, soft, non-tender. No masses palpable  MSK: Right lower extremity in splint, left lower extremity in cam boot Neurological: Grossly intact.    Data Reviewed:    Latest Ref Rng & Units 04/06/2024    3:23 AM 04/05/2024    3:22  AM 04/04/2024    3:46 AM  CBC  WBC 4.0 - 10.5 K/uL 7.0  6.7  5.7   Hemoglobin 12.0 - 15.0 g/dL 89.7  89.8  89.6   Hematocrit 36.0 - 46.0 % 32.1  30.4  32.1   Platelets 150 - 400 K/uL 168  154  117       Latest Ref Rng & Units 04/06/2024    3:23 AM 04/04/2024    3:46 AM 04/03/2024   12:01 PM  BMP  Glucose  70 - 99 mg/dL 885  873    BUN 6 - 20 mg/dL 12  13    Creatinine 9.55 - 1.00 mg/dL 9.23  9.21  9.18   Sodium 135 - 145 mmol/L 136  136    Potassium 3.5 - 5.1 mmol/L 4.3  4.2    Chloride 98 - 111 mmol/L 102  103    CO2 22 - 32 mmol/L 25  24    Calcium  8.9 - 10.3 mg/dL 8.8  8.7     Family Communication: n/a  Disposition: Status is: Inpatient  DVT PPx: ASA 81 mg BID       Author: MDALA-GAUSI, Kristin Weitzman AGATHA, MD 04/07/2024 12:53 PM  For on call review [email or web address]    "

## 2024-04-07 NOTE — Progress Notes (Addendum)
-  Subjective: 4 Days Post-Op Procedures (LRB): OPEN REDUCTION INTERNAL FIXATION (ORIF) FOOT LISFRANC FRACTURE (Right) Patient reports pain as mild.  Tolerating regular diet.  Struggling with NWB on the right in PT.  Objective: Vital signs in last 24 hours: Temp:  [97.7 F (36.5 C)-98.7 F (37.1 C)] 98.6 F (37 C) (01/26 0413) Pulse Rate:  [63-72] 63 (01/26 0413) Resp:  [15-16] 16 (01/26 0413) BP: (123-147)/(70-93) 132/90 (01/26 0413) SpO2:  [100 %] 100 % (01/26 0413)  Intake/Output from previous day: 01/25 0701 - 01/26 0700 In: 640 [P.O.:640] Out: 650 [Urine:650] Intake/Output this shift: No intake/output data recorded.  Recent Labs    04/05/24 0322 04/06/24 0323  HGB 10.1* 10.2*   Recent Labs    04/05/24 0322 04/06/24 0323  WBC 6.7 7.0  RBC 3.50* 3.58*  HCT 30.4* 32.1*  PLT 154 168   Recent Labs    04/06/24 0323  NA 136  K 4.3  CL 102  CO2 25  BUN 12  CREATININE 0.76  GLUCOSE 114*  CALCIUM  8.8*   No results for input(s): LABPT, INR in the last 72 hours.  PE:  wn wd woman in nad.  A and O.  R LE splinted.  Toes with brisk cap refill and active PF and DF.  Intact sens to LT at the toes.   Assessment/Plan: 4 Days Post-Op Procedures (LRB): OPEN REDUCTION INTERNAL FIXATION (ORIF) FOOT LISFRANC FRACTURE (Right) Up with therapy  STRICT NWB on the R LE.  After previous attempt at discharge home NWB on the left lower extremity after ORIF last year the patient failed and walked on the R LE.  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  I believe it's unrealistic for her to discharge home NWB on the R LE after ORIF of her right foot 4 days ago.  I recommend SNF placement for convalescence and continued PT so she has the best chance to heal from the R foot ORIF.  I will continue to follow her as an inpatient.  Her pain is well controlled.  I'll decrease the opioid doses to help with her itching.      Kristin [name] 04/07/2024, 9:20 AM

## 2024-04-07 NOTE — TOC Progression Note (Addendum)
 Transition of Care [facility]) - Progression Note    Patient Details  Name: Kristin [name] MRN: [id number] Date of Birth: 1975-04-03  [facility]) CM/SW Contact  Alfonse JONELLE Rex, RN Phone Number: 04/07/2024, 12:05 PM  Clinical Narrative:   Level 2 PASRR [id number] E 04/07/24 to 05/07/24) . No bed offers at this time. SNF referral sent to [facility] in [place], [place]. Call to facility to speak with admit coordinator, out of the office today. NCM will follow up tomorrow to discuss possible SNF bed offer.     Expected Discharge Plan: Skilled Nursing Facility Barriers to Discharge: Continued Medical Work up               Expected Discharge Plan and Services       Living arrangements for the past 2 months: Homeless                                       Social Drivers of Health (SDOH) Interventions SDOH Screenings   Food Insecurity: Food Insecurity Present (04/02/2024)  Housing: High Risk (04/02/2024)  Transportation Needs: No Transportation Needs (04/02/2024)  Utilities: At Risk (04/02/2024)  Depression (PHQ2-9): High Risk (08/24/2023)  Social Connections: Unknown (12/19/2021)   Received from [facility]  Tobacco Use: High Risk (04/03/2024)    Readmission Risk Interventions    04/04/2024    1:57 PM 12/19/2023    3:38 PM  Readmission Risk Prevention Plan  Transportation Screening Complete Complete  PCP or Specialist Appt within 5-7 Days Complete Complete  Home Care Screening Complete Complete  Medication Review (RN CM) Complete Complete

## 2024-04-07 NOTE — Plan of Care (Signed)

## 2024-04-08 DIAGNOSIS — S82891A Other fracture of right lower leg, initial encounter for closed fracture: Secondary | ICD-10-CM | POA: Diagnosis not present

## 2024-04-08 MED ORDER — NITROFURANTOIN MONOHYD MACRO 100 MG PO CAPS
100.0000 mg | ORAL_CAPSULE | Freq: Two times a day (BID) | ORAL | Status: AC
  Administered 2024-04-08 – 2024-04-12 (×10): 100 mg via ORAL
  Filled 2024-04-08 (×10): qty 1

## 2024-04-08 NOTE — Progress Notes (Signed)
 Physical Therapy Treatment Patient Details Name: Kristin [name] MRN: [id number] DOB: 07/22/1975 Today's Date: 04/08/2024   History of Present Illness 49 y.o. female with medical history significant of hypertension, previous Takotsubo cardiomyopathy, bipolar disorder, and prior Left ankle ORIF and primary repair of deltoid ligament on 12/18/23 who presented with right ankle pain after a fall and was found to have a right ankle fracture (Lisfranc injury) and underwent surgical treatment of right ankle injury on 04/03/24.    PT Comments  Pt was AxO x 3 pleasant and willing.  Assisted with EOB sitting as well as donning PJ bothoms and LEFT CAM boot in anticipation to get OOB to recliner.  During standing to pull up PJ bottoms, noted Pt to be PWB through her R LE (NWBing orders post ORIFfoot/ankle).  Attempted to redirect and Educate Pt was unsuccessful as she became increasing agittated with Therapist pertaining to her NWBing stating, That's impossible.  She abruptly sat back down on the bed and stated, I'm done.  I'm not doing this.  Then laid back downon bed declining suggestion to elevated R LE.  RN enetered room.  Reported that Pt was non compliant with her R LE NWBing and abrupt behavior to continue PT session.  General transfer comment: Pt was self able to rise from bed however required MAX VC's to maintain NWBing R LE.  Pt performed static standing to pull up her PJ bottoms with weight bearing through her suppose to be NWBing R LE.  Attempted to redirect and educate resulted in  increased frustration.  When instructed to only stand on her left LE with walker, Pt stated, that's impossible.    Pt declined to transfer to recliner as planned and got back bed.  RN entered room.     If plan is discharge home, recommend the following: A lot of help with walking and/or transfers;A lot of help with bathing/dressing/bathroom;Assistance with cooking/housework;Assist for transportation;Help with stairs or  ramp for entrance   Can travel by private vehicle        Equipment Recommendations  Wheelchair (measurements PT);Wheelchair cushion (measurements PT)    Recommendations for Other Services       Precautions / Restrictions Precautions Recall of Precautions/Restrictions: Intact Required Braces or Orthoses: Splint/Cast Splint/Cast: CAM boot old Fx on her LEFT LE/soft ACE wrap cast R LE new Fx's Splint/Cast - Date Prophylactic Dressing Applied (if applicable): 04/03/24 Restrictions Weight Bearing Restrictions Per Provider Order: Yes RLE Weight Bearing Per Provider Order: Non weight bearing LLE Weight Bearing Per Provider Order: Weight bearing as tolerated     Mobility  Bed Mobility Overal bed mobility: Needs Assistance Bed Mobility: Supine to Sit, Sit to Supine     Supine to sit: Supervision Sit to supine: Supervision   General bed mobility comments: Pt self able EOB and back to bed    Transfers Overall transfer level: Needs assistance Equipment used: Rolling walker (2 wheels)   Sit to Stand: Supervision           General transfer comment: Pt was self able to rise from bed however required MAX VC's to maintain NWBing R LE.  Pt performed static standing to pull up her PJ bottoms with weight bearing through her suppose to be NWBing R LE.  Attempted to redirect and educate resulted in  increased frustration.  When instructed to only stand on her left LE with walker, Pt stated, that's impossible.    Pt declined to transfer to recliner as planned and got back bed.  Ambulation/Gait                   Stairs             Wheelchair Mobility     Tilt Bed    Modified Rankin (Stroke Patients Only)       Balance                                            Communication Communication Communication: No apparent difficulties  Cognition   Behavior During Therapy: WFL for tasks assessed/performed   PT - Cognitive impairments:  Safety/Judgement                       PT - Cognition Comments: Pt was AxO x 3 pleasant and willing.  Assisted with EOB sitting as well as donning PJ bothoms and LEFT CAM boot in anticipation to get OOB to recliner.  During standing to pull up PJ bottoms, noted Pt to be PWB through her R LE (NWBing orders post ORIFfoot/ankle).  Attempted to redirect and Educate Pt was unsuccessful as she became increasing agittated with Therapist pertaining to her NWBing stating, That's impossible.  She abruptly sat back down on the bed and stated, I'm done.  I'm not doing this.  Then laid back downon bed declining suggestion to elevated R LE.  RN enetered room.  Reported that Pt was non compliant with her R LE NWBing and abrupt behavior to continue PT session. Following commands: Intact      Cueing Cueing Techniques: Verbal cues  Exercises      General Comments        Pertinent Vitals/Pain Pain Assessment Pain Assessment: 0-10 Pain Score: 10-Worst pain ever Pain Location: sitting EOB Pt reports 20/10 back pain and 10/10 R ankle/foot pain.  Pt was pre medicated but also pain meds requested. Pain Descriptors / Indicators: Discomfort, Guarding, Operative site guarding, Throbbing, Stabbing Pain Intervention(s): Monitored during session, Premedicated before session, Repositioned, Patient requesting pain meds-RN notified    Home Living                          Prior Function            PT Goals (current goals can now be found in the care plan section) Progress towards PT goals: Progressing toward goals    Frequency    Min 3X/week      PT Plan      Co-evaluation              AM-PAC PT 6 Clicks Mobility   Outcome Measure  Help needed turning from your back to your side while in a flat bed without using bedrails?: None Help needed moving from lying on your back to sitting on the side of a flat bed without using bedrails?: None Help needed moving to and from a  bed to a chair (including a wheelchair)?: None Help needed standing up from a chair using your arms (e.g., wheelchair or bedside chair)?: A Little Help needed to walk in hospital room?: A Lot Help needed climbing 3-5 steps with a railing? : A Lot 6 Click Score: 19    End of Session Equipment Utilized During Treatment: Gait belt Activity Tolerance: Other (comment) (Pt ceased to complete PT session due to increased agitation) Patient left: in bed;with  call bell/phone within reach;with bed alarm set;with family/visitor present;with nursing/sitter in room Nurse Communication: Mobility status PT Visit Diagnosis: Muscle weakness (generalized) (M62.81);Unsteadiness on feet (R26.81);Other abnormalities of gait and mobility (R26.89)     Time: 9045-8985 PT Time Calculation (min) (ACUTE ONLY): 20 min  Charges:    $Therapeutic Activity: 8-22 mins PT General Charges $$ ACUTE PT VISIT: 1 Visit                     Katheryn Leap  PTA Acute  Rehabilitation Services Office M-F          [phone]

## 2024-04-08 NOTE — Progress Notes (Signed)
 " Progress Note   [name] FMW:[id number] DOB: 1975/07/31 DOA: 04/02/2024     6 DOS: the patient was seen and examined on 04/08/2024    Brief hospital course: Kristin [name] is a 48 y.o. female with medical history significant of hypertension,  previous Takotsubo cardiomyopathy, bipolar disorder, and prior left ankle fracture s/p ORIF who presented with right ankle pain after a fall and was found to have a right ankle fracture (Lisfranc injury).  Orthopedics was consulted and patient underwent surgical treatment of her fractures/dislocations.  Postoperatively, orthopedics recommending nonweightbearing to the right lower extremity.  Assessment and Plan:  UTI UA concerning for infection. - Nitrofurantoin  100 mg twice daily x 5 days.  Right ankle fracture/right Lisfranc injury with associated medial malleolus fracture secondary to fall Acute.   Patient presented after slipping at home falling injuring her right ankle.   X-rays revealed minimally displaced fracture of the lateral malleolus, fracture of the medial base of the second metatarsal with widening of the first intermetatarsal space, no additional fracture involving the base of the third metatarsal along with the lateral corner of the cuboid suspected. Orthopedics was consulted. Patient underwent surgical treatment of her right foot ankle injury including management of multiple TMT dislocations and right ankle lateral malleolus fracture. - Postoperatively, orthopedics recommending nonweightbearing on right ankle and Lovenox  for DVT prophylaxis from postop day 1. - Patient requests she be given an alternative agent (other than Lovenox ) for DVT prophylaxis.  Started on aspirin  81 mg twice daily. - Patient evaluated by PT and SNF recommended. Placement is pending.   Status post left ankle ORIF Patient was scheduled for a redo procedure of the left. This has been deferred in the setting of new right ankle fracture. -  Orthopedics recommending weightbearing as tolerated to the left lower extremity in a cam boot  Pruritus Patient complains of severe itching, which has happened to her before in the postoperative period.  Patient states this is improving. S/p Decadron , Pepcid . Noted that it is likely related to her oxycodone  use. - Continue as needed hydroxyzine , Benadryl . - Minimize use of oxycodone  if possible.  History of Takotsubo cardiomyopathy Patient appears to be euvolemic on physical exam.   Last echocardiogram noted EF to be 60 to 65% with normal diastolic parameters when last checked 04/17/2023. - Will monitor   Hypertension - Continue home lisinopril -hydrochlorothiazide .   Tobacco abuse Patient reports smoking quarter of a pack cigarettes per day on average.   She declined nicotine  patch. - Counseled on the need of cessation of tobacco use      Subjective: Patient still complains of back pain.  Reports she did not sleep well last night.  UA suggestive for UTI.  Starting nitrofurantoin  today.  Physical Exam: BP 124/83 (BP Location: Right Arm)   Pulse 75   Temp 98.8 F (37.1 C) (Oral)   Resp 15   Ht 5' 4 (1.626 m)   Wt 82.1 kg   LMP  (LMP Unknown)   SpO2 100%   BMI 31.07 kg/m     General: Alert, oriented X3  Eyes: Pupils equal, reactive  Oral cavity: moist mucous membranes  Head: Atraumatic, normocephalic  Neck: supple  Chest: clear to auscultation. No crackles, no wheezes  CVS: S1,S2 RRR. No murmurs  Abd: No distention, soft, non-tender. No masses palpable  MSK: Right lower extremity in splint, left lower extremity in cam boot Neurological: Grossly intact.    Data Reviewed:    Latest Ref Rng &  Units 04/06/2024    3:23 AM 04/05/2024    3:22 AM 04/04/2024    3:46 AM  CBC  WBC 4.0 - 10.5 K/uL 7.0  6.7  5.7   Hemoglobin 12.0 - 15.0 g/dL 89.7  89.8  89.6   Hematocrit 36.0 - 46.0 % 32.1  30.4  32.1   Platelets 150 - 400 K/uL 168  154  117       Latest Ref Rng & Units  04/06/2024    3:23 AM 04/04/2024    3:46 AM 04/03/2024   12:01 PM  BMP  Glucose 70 - 99 mg/dL 885  873    BUN 6 - 20 mg/dL 12  13    Creatinine 9.55 - 1.00 mg/dL 9.23  9.21  9.18   Sodium 135 - 145 mmol/L 136  136    Potassium 3.5 - 5.1 mmol/L 4.3  4.2    Chloride 98 - 111 mmol/L 102  103    CO2 22 - 32 mmol/L 25  24    Calcium  8.9 - 10.3 mg/dL 8.8  8.7     Family Communication: n/a  Disposition: Status is: Inpatient  DVT PPx: ASA 81 mg BID       Author: MDALA-GAUSI, Daviel Allegretto AGATHA, MD 04/08/2024 2:55 PM  For on call review [email or web address]    "

## 2024-04-08 NOTE — TOC Progression Note (Signed)
 Transition of Care [facility]) - Progression Note    Patient Details  Name: ANYELI HOCKENBURY MRN: [id number] Date of Birth: 12/21/1975  [facility] CM/SW Contact  Alfonse JONELLE Rex, RN Phone Number: 04/08/2024, 2:35 PM  Clinical Narrative:   No SNF bed offers to date. Email sent to [facility] CM Senior Management requesting assistance with placement at [facility], await determination.     Expected Discharge Plan: Skilled Nursing Facility Barriers to Discharge: Continued Medical Work up               Expected Discharge Plan and Services       Living arrangements for the past 2 months: Homeless                                       Social Drivers of Health (SDOH) Interventions SDOH Screenings   Food Insecurity: Food Insecurity Present (04/02/2024)  Housing: High Risk (04/02/2024)  Transportation Needs: No Transportation Needs (04/02/2024)  Utilities: At Risk (04/02/2024)  Depression (PHQ2-9): High Risk (08/24/2023)  Social Connections: Unknown (12/19/2021)   Received from [facility]  Tobacco Use: High Risk (04/03/2024)    Readmission Risk Interventions    04/04/2024    1:57 PM 12/19/2023    3:38 PM  Readmission Risk Prevention Plan  Transportation Screening Complete Complete  PCP or Specialist Appt within 5-7 Days Complete Complete  Home Care Screening Complete Complete  Medication Review (RN CM) Complete Complete

## 2024-04-08 NOTE — Progress Notes (Signed)
 Subjective: 5 Days Post-Op Procedures (LRB): OPEN REDUCTION INTERNAL FIXATION (ORIF) FOOT LISFRANC FRACTURE (Right) Patient reports pain as severe and unfortunately reports itching when taking oxycodone . She reports that PT came by but she was not up to doing physical therapy today due to pain and fatigue. Struggling with NWB on the right in PT. She reports back pain and UTI symptoms. She is being treated for UTI by hospitalist. She is tolerating regular diet.  Objective: Vital signs in last 24 hours: Temp:  [98.3 F (36.8 C)-98.8 F (37.1 C)] 98.8 F (37.1 C) (01/27 1418) Pulse Rate:  [66-77] 75 (01/27 1418) Resp:  [15-16] 15 (01/27 0422) BP: (117-155)/(83-111) 124/83 (01/27 1418) SpO2:  [100 %] 100 % (01/27 1418)  Intake/Output from previous day: 01/26 0701 - 01/27 0700 In: 1520 [P.O.:1520] Out: 1050 [Urine:1050] Intake/Output this shift: No intake/output data recorded.  Recent Labs    04/06/24 0323  HGB 10.2*   Recent Labs    04/06/24 0323  WBC 7.0  RBC 3.58*  HCT 32.1*  PLT 168   Recent Labs    04/06/24 0323  NA 136  K 4.3  CL 102  CO2 25  BUN 12  CREATININE 0.76  GLUCOSE 114*  CALCIUM  8.8*   No results for input(s): LABPT, INR in the last 72 hours.  PE: wn wd woman in nad. A and O. R LE splinted. Toes with brisk cap refill and active PF and DF. Intact sensation to LT at the toes.    Assessment/Plan: 5 Days Post-Op Procedures (LRB): OPEN REDUCTION INTERNAL FIXATION (ORIF) FOOT LISFRANC FRACTURE (Right) Up with therapy. STRICT NWB on the R LE. After previous attempt at discharge home NWB on the left lower extremity after ORIF last year, the patient failed and walked on the L LE. 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 I believe it is unrealistic for her to discharge home NWB on the R LE after ORIF of her foot 5 days ago. I agree with Dr. Patric recommendation of SNF placement for convalescence and continued PT so she has the  best change to heal from the R foot ORIF. We will continue to follow her as an inpatient. Hopefully she will be feeling up for PT tomorrow.     Kristin [name] 04/08/2024, 4:52 PM

## 2024-04-09 DIAGNOSIS — S82891A Other fracture of right lower leg, initial encounter for closed fracture: Secondary | ICD-10-CM | POA: Diagnosis not present

## 2024-04-09 LAB — IGE: IgE (Immunoglobulin E), Serum: 493 [IU]/mL (ref 6–495)

## 2024-04-09 NOTE — Progress Notes (Signed)
 " Progress Note   [name] FMW:[id number] DOB: [birth year] DOA: 04/02/2024     7 DOS: the patient was seen and examined on 04/09/2024    Brief hospital course: BISMA KLETT is a 49 y.o. female with medical history significant of hypertension,  previous Takotsubo cardiomyopathy, bipolar disorder, and prior left ankle fracture s/p ORIF who presented with right ankle pain after a fall and was found to have a right ankle fracture (Lisfranc injury).  Orthopedics was consulted and patient underwent surgical treatment of her fractures/dislocations.  Postoperatively, orthopedics recommending nonweightbearing to the right lower extremity.  Assessment and Plan:  UTI UA concerning for infection. - Nitrofurantoin  100 mg twice daily x 5 days.  Right ankle fracture/right Lisfranc injury with associated medial malleolus fracture secondary to fall Acute.   Patient presented after slipping at home falling injuring her right ankle.   X-rays revealed minimally displaced fracture of the lateral malleolus, fracture of the medial base of the second metatarsal with widening of the first intermetatarsal space, no additional fracture involving the base of the third metatarsal along with the lateral corner of the cuboid suspected. Orthopedics was consulted. Patient underwent surgical treatment of her right foot ankle injury including management of multiple TMT dislocations and right ankle lateral malleolus fracture. - Postoperatively, orthopedics recommending nonweightbearing on right ankle and Lovenox  for DVT prophylaxis from postop day 1. - Patient requests she be given an alternative agent (other than Lovenox ) for DVT prophylaxis.  Started on aspirin  81 mg twice daily. - Patient evaluated by PT and SNF recommended. Placement is pending.   Status post left ankle ORIF Patient was scheduled for a redo procedure of the left. This has been deferred in the setting of new right ankle fracture. -  Orthopedics recommending weightbearing as tolerated to the left lower extremity in a cam boot  Pruritus Patient complains of severe itching, which has happened to her before in the postoperative period.  Patient states this is improving. S/p Decadron , Pepcid . Noted that it is likely related to her oxycodone  use. - Continue as needed hydroxyzine , Benadryl . - Minimize use of oxycodone  if possible.  History of Takotsubo cardiomyopathy Patient appears to be euvolemic on physical exam.   Last echocardiogram noted EF to be 60 to 65% with normal diastolic parameters when last checked 04/17/2023. - Will monitor   Hypertension - Continue home lisinopril -hydrochlorothiazide .   Tobacco abuse Patient reports smoking quarter of a pack cigarettes per day on average.   She declined nicotine  patch. - Counseled on the need of cessation of tobacco use      Subjective: Patient feeling better after starting nitrofurantoin  yesterday.   Physical Exam: BP 124/79 (BP Location: Right Arm)   Pulse 66   Temp (!) 97.4 F (36.3 C) (Oral)   Resp 18   Ht 5' 4 (1.626 m)   Wt 82.1 kg   LMP  (LMP Unknown)   SpO2 100%   BMI 31.07 kg/m     General: Alert, oriented X3  Eyes: Pupils equal, reactive  Oral cavity: moist mucous membranes  Head: Atraumatic, normocephalic  Neck: supple  Chest: clear to auscultation. No crackles, no wheezes  CVS: S1,S2 RRR. No murmurs  Abd: No distention, soft, non-tender. No masses palpable  MSK: Right lower extremity in splint, left lower extremity in cam boot Neurological: Grossly intact.    Data Reviewed:    Latest Ref Rng & Units 04/06/2024    3:23 AM 04/05/2024    3:22 AM 04/04/2024  3:46 AM  CBC  WBC 4.0 - 10.5 K/uL 7.0  6.7  5.7   Hemoglobin 12.0 - 15.0 g/dL 89.7  89.8  89.6   Hematocrit 36.0 - 46.0 % 32.1  30.4  32.1   Platelets 150 - 400 K/uL 168  154  117       Latest Ref Rng & Units 04/06/2024    3:23 AM 04/04/2024    3:46 AM 04/03/2024   12:01 PM   BMP  Glucose 70 - 99 mg/dL 885  873    BUN 6 - 20 mg/dL 12  13    Creatinine 9.55 - 1.00 mg/dL 9.23  9.21  9.18   Sodium 135 - 145 mmol/L 136  136    Potassium 3.5 - 5.1 mmol/L 4.3  4.2    Chloride 98 - 111 mmol/L 102  103    CO2 22 - 32 mmol/L 25  24    Calcium  8.9 - 10.3 mg/dL 8.8  8.7     Family Communication: n/a  Disposition: Status is: Inpatient  DVT PPx: ASA 81 mg BID       Author: MDALA-GAUSI, Rawson Minix AGATHA, MD 04/09/2024 11:45 AM  For on call review [email or web address]    "

## 2024-04-09 NOTE — Plan of Care (Signed)
   Problem: Clinical Measurements: Goal: Ability to maintain clinical measurements within normal limits will improve Outcome: Progressing Goal: Will remain free from infection Outcome: Progressing Goal: Diagnostic test results will improve Outcome: Progressing Goal: Respiratory complications will improve Outcome: Progressing

## 2024-04-09 NOTE — TOC Progression Note (Addendum)
 Transition of Care [facility]) - Progression Note    Patient Details  Name: Kristin [name] MRN: [id number] Date of Birth: Dec 19, 1975  [facility]) CM/SW Contact  Alfonse JONELLE Rex, RN Phone Number: 04/09/2024, 12:24 PM  Clinical Narrative:   Call to [facility]) nurse liaison  rebecca at [phone], reviewed patient's need for short term rehab. [place] request CM to sent referrals to [facility], [employer], [employer] and [facility] and Rehab in [place] for her review, await determination.   -2;40pm Call to Laymon Mustard, [facility] Care Coordinator, [facility] and Rehabilitation Services [phone] , requested review for short term rehab, patient information provided. Brittany states she will review and notify CM of determination.    -3:00pm  Call from Brittany w/ [employer], confirmed able to accept patient at [facility] and healthcare in [place] or [place], pt resides in [place], accepted bed offer at [facility], facility will initiate SNF shara, auth pending.    Expected Discharge Plan: Skilled Nursing Facility Barriers to Discharge: Continued Medical Work up               Expected Discharge Plan and Services       Living arrangements for the past 2 months: Homeless                                       Social Drivers of Health (SDOH) Interventions SDOH Screenings   Food Insecurity: Food Insecurity Present (04/02/2024)  Housing: High Risk (04/02/2024)  Transportation Needs: No Transportation Needs (04/02/2024)  Utilities: At Risk (04/02/2024)  Depression (PHQ2-9): High Risk (08/24/2023)  Social Connections: Unknown (12/19/2021)   Received from [facility]  Tobacco Use: High Risk (04/03/2024)    Readmission Risk Interventions    04/04/2024    1:57 PM 12/19/2023    3:38 PM  Readmission Risk Prevention Plan  Transportation Screening Complete Complete  PCP or Specialist Appt within 5-7 Days  Complete Complete  Home Care Screening Complete Complete  Medication Review (RN CM) Complete Complete

## 2024-04-09 NOTE — Progress Notes (Signed)
 Occupational Therapy Treatment Patient Details Name: Kristin [name] MRN: [id number] DOB: 22-May-1975 Today's Date: 04/09/2024   History of present illness 49 y.o. female with medical history significant of hypertension, previous Takotsubo cardiomyopathy, bipolar disorder, and prior Left ankle ORIF and primary repair of deltoid ligament on 12/18/23 who presented with right ankle pain after a fall and was found to have a right ankle fracture (Lisfranc injury) and underwent surgical treatment of right ankle injury on 04/03/24.   OT comments  Pt walking out of bathroom using RW upon OT arrival. Pt wearing CAM boot on L LE. OT reviewed R LE NWB and pt stated, that's impossible. Pt transferred to [facility] with Sup, then STS Sup to sit EOB to participate in UB/LB bathing and dressing tasks min A/CGA. OT will continue to follow acutely to maximize level of function and safety      If plan is discharge home, recommend the following:  Assist for transportation;Help with stairs or ramp for entrance;A little help with bathing/dressing/bathroom;A little help with walking and/or transfers;Assistance with cooking/housework   Equipment Recommendations  Other (comment);Wheelchair (measurements OT);Wheelchair cushion (measurements OT) (defer)    Recommendations for Other Services      Precautions / Restrictions Precautions Precautions: Fall Recall of Precautions/Restrictions: Intact Required Braces or Orthoses: Splint/Cast Splint/Cast: CAM boot old Fx on her LEFT LE/soft ACE wrap cast R LE new Fx's Splint/Cast - Date Prophylactic Dressing Applied (if applicable): 04/03/24 Restrictions Weight Bearing Restrictions Per Provider Order: Yes RLE Weight Bearing Per Provider Order: Non weight bearing LLE Weight Bearing Per Provider Order: Weight bearing as tolerated       Mobility Bed Mobility               General bed mobility comments: pt walking out of bathroom using RW upon OT arrival     Transfers Overall transfer level: Needs assistance Equipment used: Rolling walker (2 wheels) Transfers: Bed to chair/wheelchair/BSC, Sit to/from Stand Sit to Stand: Supervision           General transfer comment: required MAX VC's to maintain NWBing R LE and pt stated, that's impossible when OT redirecte dpt to maintain NWB     Balance Overall balance assessment: Needs assistance Sitting-balance support: Feet supported Sitting balance-Leahy Scale: Good Sitting balance - Comments: pt able to reach down and doff CAM boot (preparing for bathing)   Standing balance support: During functional activity, Bilateral upper extremity supported, Reliant on assistive device for balance Standing balance-Leahy Scale: Poor                             ADL either performed or assessed with clinical judgement   ADL Overall ADL's : Needs assistance/impaired     Grooming: Wash/dry hands;Wash/dry face;Set up;Sitting       Lower Body Bathing: Minimal assistance;Contact guard assist;Sitting/lateral leans;Sit to/from stand;Cueing for compensatory techniques       Lower Body Dressing: Minimal assistance;Contact guard assist;Sitting/lateral leans;Sit to/from stand;Cueing for compensatory techniques   Toilet Transfer: Contact guard assist;Total assistance;Ambulation;Rolling walker (2 wheels);BSC/3in1;Cueing for safety   Toileting- Clothing Manipulation and Hygiene: Contact guard assist;Supervision/safety;Sitting/lateral lean;Sit to/from stand       Functional mobility during ADLs: Contact guard assist;Supervision/safety;Rolling walker (2 wheels);Cueing for safety General ADL Comments: Prior to this admission, patient was in CAM boot for her L ankle, and reporting poor adherence to weight bearing precautions    Extremity/Trunk Assessment Upper Extremity Assessment Upper Extremity Assessment: Overall WFL for tasks  assessed   Lower Extremity Assessment Lower Extremity  Assessment: Defer to PT evaluation        Vision Ability to See in Adequate Light: 0 Adequate Patient Visual Report: No change from baseline     Perception     Praxis     Communication Communication Communication: No apparent difficulties   Cognition Arousal: Alert Behavior During Therapy: WFL for tasks assessed/performed Cognition: No apparent impairments                               Following commands: Intact        Cueing   Cueing Techniques: Verbal cues  Exercises      Shoulder Instructions       General Comments      Pertinent Vitals/ Pain       Pain Assessment Pain Assessment: Faces Faces Pain Scale: Hurts a little bit Pain Location: R ankle/foot Pain Descriptors / Indicators: Discomfort, Guarding, Operative site guarding, Throbbing Pain Intervention(s): Limited activity within patient's tolerance, Premedicated before session, Repositioned, Monitored during session  Home Living                                          Prior Functioning/Environment              Frequency  Min 2X/week        Progress Toward Goals  OT Goals(current goals can now be found in the care plan section)  Progress towards OT goals: Progressing toward goals     Plan      Co-evaluation                 AM-PAC OT 6 Clicks Daily Activity     Outcome Measure   Help from another person eating meals?: None Help from another person taking care of personal grooming?: A Little Help from another person toileting, which includes using toliet, bedpan, or urinal?: A Little Help from another person bathing (including washing, rinsing, drying)?: A Little Help from another person to put on and taking off regular upper body clothing?: A Little Help from another person to put on and taking off regular lower body clothing?: A Little 6 Click Score: 19    End of Session Equipment Utilized During Treatment: Gait belt;Rolling walker (2  wheels);Other (comment) (3 in 1)  OT Visit Diagnosis: Unsteadiness on feet (R26.81);Other abnormalities of gait and mobility (R26.89);Muscle weakness (generalized) (M62.81);History of falling (Z91.81);Repeated falls (R29.6);Pain Pain - Right/Left: Right Pain - part of body: Ankle and joints of foot;Leg   Activity Tolerance Patient tolerated treatment well   Patient Left with call bell/phone within reach;in bed;Other (comment) (sitting EOB)   Nurse Communication Mobility status        Time: 8788-8764 OT Time Calculation (min): 24 min  Charges: OT General Charges $OT Visit: 1 Visit OT Treatments $Self Care/Home Management : 8-22 mins $Therapeutic Activity: 8-22 mins    Jacques Karna Loose 04/09/2024, 12:46 PM

## 2024-04-10 ENCOUNTER — Encounter (HOSPITAL_COMMUNITY): Payer: Self-pay | Admitting: Orthopedic Surgery

## 2024-04-10 MED ORDER — NAPROXEN 250 MG PO TABS: 250.0000 mg | ORAL_TABLET | Freq: Two times a day (BID) | ORAL | 1 refills | Status: AC

## 2024-04-10 MED ORDER — ASPIRIN 81 MG PO TBEC: 81.0000 mg | DELAYED_RELEASE_TABLET | Freq: Two times a day (BID) | ORAL | 12 refills | Status: DC

## 2024-04-10 NOTE — Progress Notes (Signed)
 " Progress Note   [name] FMW:[id number] DOB: [birth year] DOA: 04/02/2024     8 DOS: the patient was seen and examined on 04/10/2024    Brief hospital course: DAZHA KEMPA is a 49 y.o. female with medical history significant of hypertension,  previous Takotsubo cardiomyopathy, bipolar disorder, and prior left ankle fracture s/p ORIF who presented with right ankle pain after a fall and was found to have a right ankle fracture (Lisfranc injury).  Orthopedics was consulted and patient underwent surgical treatment of her fractures/dislocations.  Postoperatively, orthopedics recommending nonweightbearing to the right lower extremity.  Assessment and Plan:  UTI UA concerning for infection. - Nitrofurantoin  100 mg twice daily x 5 days.  Right ankle fracture/right Lisfranc injury with associated medial malleolus fracture secondary to fall Acute.   Patient presented after slipping at home falling injuring her right ankle.   X-rays revealed minimally displaced fracture of the lateral malleolus, fracture of the medial base of the second metatarsal with widening of the first intermetatarsal space, no additional fracture involving the base of the third metatarsal along with the lateral corner of the cuboid suspected. Orthopedics was consulted. Patient underwent surgical treatment of her right foot ankle injury including management of multiple TMT dislocations and right ankle lateral malleolus fracture. - Postoperatively, orthopedics recommending nonweightbearing on right ankle and Lovenox  for DVT prophylaxis from postop day 1. - Patient requests she be given an alternative agent (other than Lovenox ) for DVT prophylaxis.  Started on aspirin  81 mg twice daily. - Patient evaluated by PT and SNF recommended. Placement is pending.   Status post left ankle ORIF Patient was scheduled for a redo procedure of the left. This has been deferred in the setting of new right ankle fracture. -  Orthopedics recommending weightbearing as tolerated to the left lower extremity in a cam boot  Pruritus Patient complains of severe itching, which has happened to her before in the postoperative period.  Patient states this is improving. S/p Decadron , Pepcid . Noted that it is likely related to her oxycodone  use. - Continue as needed hydroxyzine , Benadryl . - Minimize use of oxycodone  if possible.  History of Takotsubo cardiomyopathy Patient appears to be euvolemic on physical exam.   Last echocardiogram noted EF to be 60 to 65% with normal diastolic parameters when last checked 04/17/2023. - Will monitor   Hypertension - Continue home lisinopril -hydrochlorothiazide .   Tobacco abuse Patient reports smoking quarter of a pack cigarettes per day on average.   She declined nicotine  patch. - Counseled on the need of cessation of tobacco use  Disposition: Placement pending.  Medically stable for discharge      Subjective: Patient feels okay today.  Pain manageable with medications.  Vital signs stable  Physical Exam: BP (!) 118/92 (BP Location: Right Arm)   Pulse (!) 59   Temp 98.2 F (36.8 C) (Oral)   Resp 16   Ht 5' 4 (1.626 m)   Wt 82.1 kg   LMP  (LMP Unknown)   SpO2 100%   BMI 31.07 kg/m     General: Alert, oriented X3  Eyes: Pupils equal, reactive  Oral cavity: moist mucous membranes  Head: Atraumatic, normocephalic  Neck: supple  Chest: clear to auscultation. No crackles, no wheezes  CVS: S1,S2 RRR. No murmurs  Abd: No distention, soft, non-tender. No masses palpable  MSK: Right lower extremity in splint, left lower extremity in cam boot Neurological: Grossly intact.    Data Reviewed:    Latest Ref Rng &  Units 04/06/2024    3:23 AM 04/05/2024    3:22 AM 04/04/2024    3:46 AM  CBC  WBC 4.0 - 10.5 K/uL 7.0  6.7  5.7   Hemoglobin 12.0 - 15.0 g/dL 89.7  89.8  89.6   Hematocrit 36.0 - 46.0 % 32.1  30.4  32.1   Platelets 150 - 400 K/uL 168  154  117        Latest Ref Rng & Units 04/06/2024    3:23 AM 04/04/2024    3:46 AM 04/03/2024   12:01 PM  BMP  Glucose 70 - 99 mg/dL 885  873    BUN 6 - 20 mg/dL 12  13    Creatinine 9.55 - 1.00 mg/dL 9.23  9.21  9.18   Sodium 135 - 145 mmol/L 136  136    Potassium 3.5 - 5.1 mmol/L 4.3  4.2    Chloride 98 - 111 mmol/L 102  103    CO2 22 - 32 mmol/L 25  24    Calcium  8.9 - 10.3 mg/dL 8.8  8.7     Family Communication: n/a  Disposition: Status is: Inpatient  DVT PPx: ASA 81 mg BID       Author: Teresita Fanton, MD 04/10/2024 8:41 AM  For on call review [email or web address]    "

## 2024-04-10 NOTE — Discharge Instructions (Signed)
 [name], MD [facility]  Please read the following information regarding your care after surgery.  Medications  You only need a prescription for the narcotic pain medicine (ex. oxycodone , Percocet, Norco).  All of the other medicines listed below are available over the counter. X Aleve  2 pills twice a day for the first 3 days after surgery. X acetominophen (Tylenol ) 650 mg every 4-6 hours as you need for minor to moderate pain X oxycodone  as prescribed for severe pain  Narcotic pain medicine (ex. oxycodone , Percocet, Vicodin) will cause constipation.  To prevent this problem, take the following medicines while you are taking any pain medicine. X docusate sodium  (Colace) 100 mg twice a day ? senna (Senokot) 2 tablets twice a day  X To help prevent blood clots, take a baby aspirin  (81 mg) twice a day for two weeks after surgery.  You should also get up every hour while you are awake to move around.    Weight Bearing X Bear weight when you are able on your operated leg or foot on the left. X Do not bear any weight on the operated leg or foot on the right.  Cast / Splint / Dressing X Keep your splint, cast or dressing clean and dry.  Dont put anything (coat hanger, pencil, etc) down inside of it.  If it gets damp, use a hair dryer on the cool setting to dry it.  If it gets soaked, call the office to schedule an appointment for a cast change.  After your dressing, cast or splint is removed; you may shower, but do not soak or scrub the wound.  Allow the water  to run over it, and then gently pat it dry.  Swelling It is normal for you to have swelling where you had surgery.  To reduce swelling and pain, keep your toes above your nose for at least 3 days after surgery.  It may be necessary to keep your foot or leg elevated for several weeks.  If it hurts, it should be elevated.  Follow Up Call my office at [phone] when you are discharged from the hospital or surgery center to schedule  an appointment to be seen two weeks after surgery.  Call my office at [phone] if you develop a fever >101.5 F, nausea, vomiting, bleeding from the surgical site or severe pain.

## 2024-04-10 NOTE — TOC Progression Note (Addendum)
 Transition of Care [facility]) - Progression Note    Patient Details  Name: Kristin [name] MRN: [id number] Date of Birth: 09/13/1975  Transition of Care [facility]) CM/SW Contact  Alfonse JONELLE Rex, RN Phone Number: 04/10/2024, 12:40 PM  Clinical Narrative:   Text received from Brittany,  [facility], reports [facility] unable to accept patient secondary to utility issues at the facility, [facility] able to accept patient.  SNF auth pending.   -3;49pm Text from Brittany with [facility], confirmed SNF auth approved, patient able to admit tomorrow.   -4;13pm Teams chat from nurse that patient states she is unable to accept SNF in [place] due to work around with her family. CM call to patient's room, explained that due to her insurance she has a small network of SNF and currently this is the only SNF offer we have, patient confirmed that she is not able to admit to a SNF in [facility], explained to patient if unable to locate at [facility] in [place] the option may be to dc home with [facility] services, pt voiced understanding.  Call to Brittany, [place] health liaison, vm left that patient has declined admit to [facility]     Expected Discharge Plan: Skilled Nursing Facility Barriers to Discharge: Continued Medical Work up               Expected Discharge Plan and Services       Living arrangements for the past 2 months: Homeless                                       Social Drivers of Health (SDOH) Interventions SDOH Screenings   Food Insecurity: Food Insecurity Present (04/02/2024)  Housing: High Risk (04/02/2024)  Transportation Needs: No Transportation Needs (04/02/2024)  Utilities: At Risk (04/02/2024)  Depression (PHQ2-9): High Risk (08/24/2023)  Social Connections: Unknown (12/19/2021)   Received from [facility]  Tobacco Use: High Risk (04/03/2024)    Readmission Risk Interventions    04/04/2024    1:57 PM 12/19/2023    3:38 PM   Readmission Risk Prevention Plan  Transportation Screening Complete Complete  PCP or Specialist Appt within 5-7 Days Complete Complete  Home Care Screening Complete Complete  Medication Review (RN CM) Complete Complete

## 2024-04-10 NOTE — Progress Notes (Signed)
 Subjective: 7 Days Post-Op Procedures (LRB): OPEN REDUCTION INTERNAL FIXATION (ORIF) FOOT LISFRANC FRACTURE (Right) Patient reports pain as mild.  Well managed with oral pain meds.  SNF placement pending.  Objective: Vital signs in last 24 hours: Temp:  [97.5 F (36.4 C)-98.2 F (36.8 C)] 98.1 F (36.7 C) (01/29 1328) Pulse Rate:  [59-74] 74 (01/29 1328) Resp:  [16-17] 17 (01/29 1328) BP: (118-128)/(74-92) 126/81 (01/29 1328) SpO2:  [100 %] 100 % (01/29 1328)  Intake/Output from previous day: 01/28 0701 - 01/29 0700 In: 600 [P.O.:600] Out: 1100 [Urine:1100] Intake/Output this shift: Total I/O In: 440 [P.O.:440] Out: 750 [Urine:750]  No results for input(s): HGB in the last 72 hours. No results for input(s): WBC, RBC, HCT, PLT in the last 72 hours. No results for input(s): NA, K, CL, CO2, BUN, CREATININE, GLUCOSE, CALCIUM  in the last 72 hours. No results for input(s): LABPT, INR in the last 72 hours.  PE:  right LE splint intact.  Wiggles toes.  L LE immobilized in a cam boot.   Assessment/Plan: 7 Days Post-Op Procedures (LRB): OPEN REDUCTION INTERNAL FIXATION (ORIF) FOOT LISFRANC FRACTURE (Right) Up with therapy  Continue nwb on the R LE.  WBAT on the LLE in the cam boot.  F/u in the office in 2 weeks.    Kristin [name] 04/10/2024, 6:03 PM

## 2024-04-10 NOTE — Plan of Care (Signed)
   Problem: Activity: Goal: Risk for activity intolerance will decrease Outcome: Progressing   Problem: Nutrition: Goal: Adequate nutrition will be maintained Outcome: Progressing   Problem: Coping: Goal: Level of anxiety will decrease Outcome: Progressing

## 2024-04-11 MED ORDER — ENOXAPARIN SODIUM 40 MG/0.4ML IJ SOSY: 40.0000 mg | PREFILLED_SYRINGE | INTRAMUSCULAR | 0 refills | Status: DC

## 2024-04-11 MED ORDER — OXYCODONE HCL 5 MG PO TABS: 5.0000 mg | ORAL_TABLET | Freq: Four times a day (QID) | ORAL | 0 refills | Status: AC | PRN

## 2024-04-11 NOTE — Progress Notes (Signed)
 " Progress Note   [name] FMW:[id number] DOB: June 04, 1975 DOA: 04/02/2024     9 DOS: the patient was seen and examined on 04/11/2024    Brief hospital course: DAJANIQUE ROBLEY is a 49 y.o. female with medical history significant of hypertension,  previous Takotsubo cardiomyopathy, bipolar disorder, and prior left ankle fracture s/p ORIF who presented with right ankle pain after a fall and was found to have a right ankle fracture (Lisfranc injury).  Orthopedics was consulted and patient underwent surgical treatment of her fractures/dislocations.  Postoperatively, orthopedics recommending nonweightbearing to the right lower extremity.  Assessment and Plan:  UTI UA concerning for infection. - Nitrofurantoin  100 mg twice daily x 5 days.  Right ankle fracture/right Lisfranc injury with associated medial malleolus fracture secondary to fall Acute.   Patient presented after slipping at home falling injuring her right ankle.   X-rays revealed minimally displaced fracture of the lateral malleolus, fracture of the medial base of the second metatarsal with widening of the first intermetatarsal space, no additional fracture involving the base of the third metatarsal along with the lateral corner of the cuboid suspected. Orthopedics was consulted. Patient underwent surgical treatment of her right foot ankle injury including management of multiple TMT dislocations and right ankle lateral malleolus fracture. - Postoperatively, orthopedics recommending nonweightbearing on right ankle and Lovenox  for DVT prophylaxis from postop day 1. - Patient requests she be given an alternative agent (other than Lovenox ) for DVT prophylaxis.  Started on aspirin  81 mg twice daily. - Patient evaluated by PT and SNF recommended. Placement is pending.   Status post left ankle ORIF Patient was scheduled for a redo procedure of the left. This has been deferred in the setting of new right ankle fracture. -  Orthopedics recommending weightbearing as tolerated to the left lower extremity in a cam boot  Pruritus Patient complains of severe itching, which has happened to her before in the postoperative period.  Patient states this is improving. S/p Decadron , Pepcid . Noted that it is likely related to her oxycodone  use. - Continue as needed hydroxyzine , Benadryl . - Minimize use of oxycodone  if possible.  History of Takotsubo cardiomyopathy Patient appears to be euvolemic on physical exam.   Last echocardiogram noted EF to be 60 to 65% with normal diastolic parameters when last checked 04/17/2023. - Will monitor   Hypertension - Continue home lisinopril -hydrochlorothiazide .   Tobacco abuse Patient reports smoking quarter of a pack cigarettes per day on average.   She declined nicotine  patch. - Counseled on the need of cessation of tobacco use  Disposition: Placement pending.  Medically stable for discharge.  Patient unfortunately declined nursing home at [place] she would like to rahab in [place]      Subjective: Patient feels okay today.  Pain manageable with medications.  Vital signs stable.  pt does not want to go to [facility] for rehab.  Wants [facility]  Physical Exam: BP (!) 148/105 (BP Location: Right Arm)   Pulse 78   Temp 98 F (36.7 C) (Oral)   Resp 19   Ht 5' 4 (1.626 m)   Wt 82.1 kg   LMP  (LMP Unknown)   SpO2 100%   BMI 31.07 kg/m     General: Alert, oriented X3  Eyes: Pupils equal, reactive  Oral cavity: moist mucous membranes  Head: Atraumatic, normocephalic  Neck: supple  Chest: clear to auscultation. No crackles, no wheezes  CVS: S1,S2 RRR. No murmurs  Abd: No distention, soft, non-tender. No masses palpable  MSK: Right lower extremity in splint, left lower extremity in cam boot Neurological: Grossly intact.    Data Reviewed:    Latest Ref Rng & Units 04/06/2024    3:23 AM 04/05/2024    3:22 AM 04/04/2024    3:46 AM  CBC  WBC 4.0 - 10.5 K/uL 7.0   6.7  5.7   Hemoglobin 12.0 - 15.0 g/dL 89.7  89.8  89.6   Hematocrit 36.0 - 46.0 % 32.1  30.4  32.1   Platelets 150 - 400 K/uL 168  154  117       Latest Ref Rng & Units 04/06/2024    3:23 AM 04/04/2024    3:46 AM 04/03/2024   12:01 PM  BMP  Glucose 70 - 99 mg/dL 885  873    BUN 6 - 20 mg/dL 12  13    Creatinine 9.55 - 1.00 mg/dL 9.23  9.21  9.18   Sodium 135 - 145 mmol/L 136  136    Potassium 3.5 - 5.1 mmol/L 4.3  4.2    Chloride 98 - 111 mmol/L 102  103    CO2 22 - 32 mmol/L 25  24    Calcium  8.9 - 10.3 mg/dL 8.8  8.7     Family Communication: n/a  Disposition: Status is: Inpatient  DVT PPx: ASA 81 mg BID       Author: Charese Abundis, MD 04/11/2024 3:05 PM  For on call review [email or web address]    "

## 2024-04-11 NOTE — Progress Notes (Signed)
 Mobility Specialist - Progress Note:   04/11/24 1513  Mobility  Activity Pivoted/transferred from chair to bed (Wheelchair)  Level of Assistance Modified independent, requires aide device or extra time  Assistive Device Wheelchair;Front wheel walker  Distance Ambulated (ft) 2 ft  Activity Response Tolerated well  Mobility Referral Yes  Mobility visit 1 Mobility  Mobility Specialist Start Time (ACUTE ONLY) 1400  Mobility Specialist Stop Time (ACUTE ONLY) 1410  Mobility Specialist Time Calculation (min) (ACUTE ONLY) 10 min   Pt was received in wheelchair, assisted Pt back to bed. No complaints during session. Returned to bed with all needs met.  [employer] - Mobility Specialist - Acute Rehabilitation Can be reached via [employer]

## 2024-04-11 NOTE — Plan of Care (Signed)
  Problem: Health Behavior/Discharge Planning: Goal: Ability to manage health-related needs will improve Outcome: Progressing   Problem: Clinical Measurements: Goal: Will remain free from infection Outcome: Progressing   Problem: Pain Managment: Goal: General experience of comfort will improve and/or be controlled Outcome: Progressing

## 2024-04-11 NOTE — TOC Progression Note (Signed)
 Transition of Care [facility]) - Progression Note    Patient Details  Name: Kristin [name] MRN: [id number] Date of Birth: February 19, 1976  Transition of Care [facility] CM/SW Contact  NORMAN ASPEN, LCSW Phone Number: 04/11/2024, 4:03 PM  Clinical Narrative:     Follow up with pt today to discuss further the SNF offer in [place].  Explained to patient that this is the ONLY bed offer due to very limited number of facilities who will accept her insurance.  Pt continues to refuse this bed and states she cannot go to family member's home because she is confined to a wheelchair.  Pt very argumentative and refuses to consider any option other than placement in [place].  Pt stating, even the doctor didn't understand why I can't get a bed in [place] and he said he would fix it.  Again, attempting to explain the barrier with insurance but pt continues to refuse the one SNF bed offer we have.  Will alert IP CM leadership as well as attending MD.  Expected Discharge Plan: Skilled Nursing Facility Barriers to Discharge: Continued Medical Work up               Expected Discharge Plan and Services       Living arrangements for the past 2 months: Homeless                                       Social Drivers of Health (SDOH) Interventions SDOH Screenings   Food Insecurity: Food Insecurity Present (04/02/2024)  Housing: High Risk (04/02/2024)  Transportation Needs: No Transportation Needs (04/02/2024)  Utilities: At Risk (04/02/2024)  Depression (PHQ2-9): High Risk (08/24/2023)  Social Connections: Unknown (12/19/2021)   Received from [facility]  Tobacco Use: High Risk (04/03/2024)    Readmission Risk Interventions    04/04/2024    1:57 PM 12/19/2023    3:38 PM  Readmission Risk Prevention Plan  Transportation Screening Complete Complete  PCP or Specialist Appt within 5-7 Days Complete Complete  Home Care Screening Complete Complete  Medication Review (RN CM) Complete Complete

## 2024-04-11 NOTE — Progress Notes (Signed)
 Physical Therapy Treatment Patient Details Name: Kristin [name] MRN: [id number] DOB: June 02, 1975 Today's Date: 04/11/2024   History of Present Illness 49 y.o. female with medical history significant of hypertension, previous Takotsubo cardiomyopathy, bipolar disorder, and prior Left ankle ORIF and primary repair of deltoid ligament on 12/18/23 who presented with right ankle pain after a fall and was found to have a right ankle fracture (Lisfranc injury) and underwent surgical treatment of right ankle injury on 04/03/24.    PT Comments  Pt agreeable for transfer and mobility with wheelchair.  Pt did better with maintaining R LE NWB today (during transfer).  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  Pt anticipates d/c to SNF.    If plan is discharge home, recommend the following: A lot of help with walking and/or transfers;A lot of help with bathing/dressing/bathroom;Assistance with cooking/housework;Assist for transportation;Help with stairs or ramp for entrance   Can travel by private vehicle        Equipment Recommendations  Wheelchair (measurements PT);Wheelchair cushion (measurements PT)    Recommendations for Other Services       Precautions / Restrictions Precautions Precautions: Fall Restrictions Weight Bearing Restrictions Per Provider Order: Yes RLE Weight Bearing Per Provider Order: Non weight bearing LLE Weight Bearing Per Provider Order: Weight bearing as tolerated (with CAM boot)     Mobility  Bed Mobility Overal bed mobility: Modified Independent                  Transfers Overall transfer level: Needs assistance Equipment used: Rolling walker (2 wheels) Transfers: Sit to/from Stand, Bed to chair/wheelchair/BSC Sit to Stand: Supervision Stand pivot transfers: Contact guard assist         General transfer comment: discussed WB status and reasoning again today and pt does  still report being tempted to take weight on R LE, improved ability to keep R foot off floor and maintain NWB during stand pivot transfer to w/c    Ambulation/Gait                   Dance Movement Psychotherapist Wheelchair mobility: Yes Wheelchair propulsion: Both upper extremities Wheelchair parts: Supervision/cueing Distance: 200 Wheelchair Assistance Details (indicate cue type and reason): visual an verbal cues for brakes, turning; reports UE fatigue with distance (both LEs assisted onto elevating leg rests)   Tilt Bed    Modified Rankin (Stroke Patients Only)       Balance                                            Communication Communication Communication: No apparent difficulties  Cognition Arousal: Alert Behavior During Therapy: WFL for tasks assessed/performed                             Following commands: Intact      Cueing Cueing Techniques: Verbal cues  Exercises      General Comments        Pertinent Vitals/Pain      Home Living                          Prior Function  PT Goals (current goals can now be found in the care plan section) Progress towards PT goals: Progressing toward goals    Frequency    Min 3X/week      PT Plan      Co-evaluation              AM-PAC PT 6 Clicks Mobility   Outcome Measure  Help needed turning from your back to your side while in a flat bed without using bedrails?: None Help needed moving from lying on your back to sitting on the side of a flat bed without using bedrails?: None Help needed moving to and from a bed to a chair (including a wheelchair)?: None Help needed standing up from a chair using your arms (e.g., wheelchair or bedside chair)?: A Little Help needed to walk in hospital room?: A Lot Help needed climbing 3-5 steps with a railing? : A Lot 6 Click Score: 19    End of Session    Activity Tolerance: Patient tolerated treatment well Patient left: Other (comment) (pt requested to be left in wheelchair, secretary notified)   PT Visit Diagnosis: Other abnormalities of gait and mobility (R26.89)     Time: 1206-1220 PT Time Calculation (min) (ACUTE ONLY): 14 min  Charges:    $Wheel Chair Management: 8-22 mins PT General Charges $$ ACUTE PT VISIT: 1 Visit                     Tari PT, DPT Physical Therapist Acute Rehabilitation Services Office: [phone]    Kati L Payson 04/11/2024, 1:32 PM

## 2024-04-11 NOTE — Plan of Care (Signed)
" °  Problem: Health Behavior/Discharge Planning: Goal: Ability to manage health-related needs will improve Outcome: Adequate for Discharge   Problem: Clinical Measurements: Goal: Ability to maintain clinical measurements within normal limits will improve Outcome: Adequate for Discharge Goal: Will remain free from infection Outcome: Progressing Goal: Diagnostic test results will improve Outcome: Progressing Goal: Respiratory complications will improve Outcome: Completed/Met Goal: Cardiovascular complication will be avoided Outcome: Completed/Met   Problem: Activity: Goal: Risk for activity intolerance will decrease Outcome: Completed/Met   Problem: Nutrition: Goal: Adequate nutrition will be maintained Outcome: Completed/Met   Problem: Coping: Goal: Level of anxiety will decrease Outcome: Progressing   Problem: Elimination: Goal: Will not experience complications related to bowel motility Outcome: Completed/Met Goal: Will not experience complications related to urinary retention Outcome: Completed/Met   Problem: Pain Managment: Goal: General experience of comfort will improve and/or be controlled Outcome: Progressing   Problem: Safety: Goal: Ability to remain free from injury will improve Outcome: Adequate for Discharge   Problem: Skin Integrity: Goal: Risk for impaired skin integrity will decrease Outcome: Completed/Met   Problem: Education: Goal: Knowledge of the prescribed therapeutic regimen will improve Outcome: Progressing   Problem: Bowel/Gastric: Goal: Gastrointestinal status for postoperative course will improve Outcome: Completed/Met   Problem: Cardiac: Goal: Ability to maintain an adequate cardiac output Outcome: Completed/Met Goal: Will show no evidence of cardiac arrhythmias Outcome: Completed/Met   Problem: Nutritional: Goal: Will attain and maintain optimal nutritional status Outcome: Completed/Met   Problem: Neurological: Goal: Will regain  or maintain usual level of consciousness Outcome: Completed/Met   Problem: Clinical Measurements: Goal: Ability to maintain clinical measurements within normal limits Outcome: Progressing Goal: Postoperative complications will be avoided or minimized Outcome: Progressing   Problem: Respiratory: Goal: Will regain and/or maintain adequate ventilation Outcome: Completed/Met Goal: Respiratory status will improve Outcome: Completed/Met   Problem: Skin Integrity: Goal: Demonstrates signs of wound healing without infection Outcome: Progressing   Problem: Urinary Elimination: Goal: Will remain free from infection Outcome: Progressing Goal: Ability to achieve and maintain adequate urine output Outcome: Completed/Met   "

## 2024-04-11 NOTE — Progress Notes (Addendum)
 Subjective: 8 Days Post-Op Procedures (LRB): OPEN REDUCTION INTERNAL FIXATION (ORIF) FOOT LISFRANC FRACTURE (Right) Patient reports pain as moderate.  PT has come by today. Maintains difficulty ambulating.  Objective: Vital signs in last 24 hours: Temp:  [98.1 F (36.7 C)-98.3 F (36.8 C)] 98.2 F (36.8 C) (01/30 0556) Pulse Rate:  [67-74] 67 (01/30 0556) Resp:  [17] 17 (01/30 0556) BP: (126-136)/(77-92) 136/92 (01/30 0556) SpO2:  [100 %] 100 % (01/30 0556)  Intake/Output from previous day: 01/29 0701 - 01/30 0700 In: 800 [P.O.:800] Out: 1350 [Urine:1350] Intake/Output this shift: No intake/output data recorded.  No results for input(s): HGB in the last 72 hours. No results for input(s): WBC, RBC, HCT, PLT in the last 72 hours. No results for input(s): NA, K, CL, CO2, BUN, CREATININE, GLUCOSE, CALCIUM  in the last 72 hours. No results for input(s): LABPT, INR in the last 72 hours.  PE: subjectively diminished sensation to LT at great toe. Able to actively PF and DF toes. Cap refill brisk. Splint in place.   Assessment/Plan: 8 Days Post-Op Procedures (LRB): OPEN REDUCTION INTERNAL FIXATION (ORIF) FOOT LISFRANC FRACTURE (Right) Up with therapy. NWB in splint on RLE. WBAT in CAM boot on LLE. Ok for discharge to SNF from ortho perspective. Pain meds on chart. Lovenox  until 2 weeks post op then 81mg  Aspirin  BID. Ortho signing off. Follow-up in office in 2 weeks.       Dickey A Sprague 04/11/2024, 12:21 PM

## 2024-04-12 DIAGNOSIS — S82891A Other fracture of right lower leg, initial encounter for closed fracture: Secondary | ICD-10-CM | POA: Diagnosis not present

## 2024-04-12 NOTE — Progress Notes (Signed)
 " Progress Note   [name] FMW:[id number] DOB: Jun 25, 1975 DOA: 04/02/2024     10 DOS: the patient was seen and examined on 04/12/2024   Brief hospital course: 49yo with h/o HTN, Takotsubo cardiomyopathy, bipolar d/o, and prior L ankle fracture s/p ORIF who presented with a fall resulting in R ankle Lisfranc fracture.  Orthopedics performed ORIF on 1/22.  She is awaiting STR placement but has refused the only bed offered.    Assessment & Plan Closed fracture of right ankle Patient presented after slipping at home, injuring her right ankle X-rays revealed minimally displaced fracture of the lateral malleolus, fracture of the medial base of the second metatarsal with widening of the first intermetatarsal space, no additional fracture involving the base of the third metatarsal along with the lateral corner of the cuboid suspected Orthopedics was consulted Patient underwent surgical treatment of her right foot ankle injury including management of multiple TMT dislocations and right ankle lateral malleolus fracture Postoperatively, orthopedics recommending nonweightbearing on right ankle and Lovenox  for DVT prophylaxis from postop day 1 Patient requested that she be given an alternative agent (other than Lovenox ) for DVT prophylaxis - started on aspirin  81 mg twice daily Patient evaluated by PT and SNF recommended NWB in splint on RLE Follow-up with orthopedics in 2 weeks She has only been accepted to 1 SNF, in [place]; she is hesitant to go there Will attempt to send out to [facility] SNFs again; if unable to gain acceptance, she may be willing to go to [place] since home really isn't a current option Status post ORIF of fracture of ankle Patient was scheduled for a redo procedure of the left This has been deferred in the setting of new right ankle fracture Orthopedics recommending weightbearing as tolerated to the left lower extremity in a cam boot History of  cardiomyopathy Euvolemic Last echocardiogram noted EF to be 60 to 65% with normal diastolic parameters when last checked 04/17/2023 Essential hypertension Continue home lisinopril -hydrochlorothiazide   She reports that hydrochlorothiazide  causes polyuria, which is also a symptom listed when she has a UTI; alternative therapy may need to be considered Bipolar 1 disorder (HCC) She does not appear to be taking medications for this issue at this time  Recurrent UTI Reports recurrent infections, knows she has one because I know my body UA concerning for infection Nitrofurantoin  100 mg twice daily x 5 days Completes abx 2/1 and recommend no further urinary testing unless she has actual symptoms Tobacco abuse 1/4 ppd She declined nicotine  patch Class 1 obesity due to excess calories with body mass index (BMI) of 31.0 to 31.9 in adult Body mass index is 31.07 kg/m.[name]  Weight loss should be encouraged Outpatient PCP/bariatric medicine f/u encouraged Significantly low or high BMI is associated with higher medical risk including morbidity and mortality       Consultants: Orthopedics PT OT ICM   Procedures: ORIF R ankle 1/22   Antibiotics: Cefazolin  x 1 Nitrofurantoin  1/27-2/1  30 Day Unplanned Readmission Risk Score    Flowsheet Row ED to Hosp-Admission (Current) from 04/02/2024 in [place]  30 Day Unplanned Readmission Risk Score (%) 16.26 Filed at 04/12/2024 1600    This score is the patient's risk of an unplanned readmission within 30 days of being discharged (0 -100%). The score is based on dignosis, age, lab data, medications, orders, and past utilization.   Low:  0-14.9   Medium: 15-21.9   High: 22-29.9   Extreme: 30 and above  Subjective: Feeling ok.  Pain is reasonably controlled.  Reluctant to go to [facility] because family can't visit her and she needs things to take with her.  Wants to try again for [place] SNF but will consider [place] if she has to  since home with family is so unlikely as an option.   Objective: Vitals:   04/12/24 0449 04/12/24 1411  BP: 111/70 (!) 160/107  Pulse: 78 76  Resp: 18 17  Temp: 98.7 F (37.1 C) 98 F (36.7 C)  SpO2: 98% 100%    Intake/Output Summary (Last 24 hours) at 04/12/2024 1638 Last data filed at 04/12/2024 0600 Gross per 24 hour  Intake 1200 ml  Output 600 ml  Net 600 ml   Filed Weights   04/02/24 1230 04/03/24 0711  Weight: 82.1 kg 82.1 kg    Exam:  General:  Appears calm and comfortable and is in NAD Eyes:  normal lids, iris ENT:  grossly normal hearing, lips & tongue, mmm Cardiovascular:  RRR. No LE edema.  Respiratory:   CTA bilaterally with no wheezes/rales/rhonchi.  Normal respiratory effort. Abdomen:  soft, NT, ND Skin:  no rash or induration seen on limited exam Musculoskeletal:  R ankle is splinted Psychiatric:  grossly normal mood and affect, speech fluent and appropriate, AOx3 Neurologic:  CN 2-12 grossly intact, moves all extremities in coordinated fashion  Data Reviewed: I have reviewed the patient's lab results since admission.  Pertinent labs for today include:   None today     Family Communication: None present  Mobility: PT/OT Consulted and are recommending - Skilled Nursing-Short Term Rehab (<3 Hours/Day)04/11/2024 1330    Code Status: Full Code    Disposition: Status is: Inpatient Remains inpatient appropriate because: awaiting placement     Time spent: 51 minutes  Unresulted Labs (From admission, onward)    None        Author: Delon Herald, MD 04/12/2024 4:38 PM  For on call review [email or web address].            "

## 2024-04-12 NOTE — Assessment & Plan Note (Deleted)
 Patient presented after slipping at home falling injuring her right ankle.   X-rays revealed minimally displaced fracture of the lateral malleolus, fracture of the medial base of the second metatarsal with widening of the first intermetatarsal space, no additional fracture involving the base of the third metatarsal along with the lateral corner of the cuboid suspected. Orthopedics was consulted. Patient underwent surgical treatment of her right foot ankle injury including management of multiple TMT dislocations and right ankle lateral malleolus fracture. - Postoperatively, orthopedics recommending nonweightbearing on right ankle and Lovenox  for DVT prophylaxis from postop day 1. - Patient requests she be given an alternative agent (other than Lovenox ) for DVT prophylaxis.  Started on aspirin  81 mg twice daily. - Patient evaluated by PT and SNF recommended. Placement is pending. Up with therapy. NWB in splint on RLE. WBAT in CAM boot on LLE. Ok for discharge to SNF from ortho perspective. Pain meds on chart. Lovenox  until 2 weeks post op then 81mg  Aspirin  BID. Ortho signing off. Follow-up in office in 2 weeks.

## 2024-04-12 NOTE — Assessment & Plan Note (Signed)
 Reports recurrent infections, knows she has one because I know my body UA concerning for infection Nitrofurantoin  100 mg twice daily x 5 days Completes abx 2/1 and recommend no further urinary testing unless she has actual symptoms

## 2024-04-12 NOTE — TOC Progression Note (Signed)
 Transition of Care [facility]) - Progression Note    Patient Details  Name: Kristin [name] MRN: [id number] Date of Birth: March 18, 1975  Transition of Care [facility]) CM/SW Contact  NORMAN ASPEN, LCSW Phone Number: 04/12/2024, 10:45 AM  Clinical Narrative:     Per discussion with MD today, have resubmitted referral to all facilities in [place].  Will see if any can reconsider bed offer but, as of this time, only offer from SNF in [facility].  Expected Discharge Plan: Skilled Nursing Facility Barriers to Discharge: Continued Medical Work up               Expected Discharge Plan and Services       Living arrangements for the past 2 months: Homeless                                       Social Drivers of Health (SDOH) Interventions SDOH Screenings   Food Insecurity: Food Insecurity Present (04/02/2024)  Housing: High Risk (04/02/2024)  Transportation Needs: No Transportation Needs (04/02/2024)  Utilities: At Risk (04/02/2024)  Depression (PHQ2-9): High Risk (08/24/2023)  Social Connections: Unknown (12/19/2021)   Received from [facility]  Tobacco Use: High Risk (04/03/2024)    Readmission Risk Interventions    04/04/2024    1:57 PM 12/19/2023    3:38 PM  Readmission Risk Prevention Plan  Transportation Screening Complete Complete  PCP or Specialist Appt within 5-7 Days Complete Complete  Home Care Screening Complete Complete  Medication Review (RN CM) Complete Complete

## 2024-04-12 NOTE — Assessment & Plan Note (Deleted)
 Patient reports smoking quarter of a pack cigarettes per day on average.   She declined nicotine  patch. - Counseled on the need of cessation of tobacco use

## 2024-04-12 NOTE — Plan of Care (Signed)
   Problem: Coping: Goal: Level of anxiety will decrease Outcome: Progressing   Problem: Pain Managment: Goal: General experience of comfort will improve and/or be controlled Outcome: Progressing   Problem: Safety: Goal: Ability to remain free from injury will improve Outcome: Progressing

## 2024-04-12 NOTE — Assessment & Plan Note (Deleted)
 Body mass index is 31.07 kg/m.[name]  Weight loss should be encouraged Outpatient PCP/bariatric medicine f/u encouraged Significantly low or high BMI is associated with higher medical risk including morbidity and mortality

## 2024-04-12 NOTE — Assessment & Plan Note (Signed)
 Euvolemic Last echocardiogram noted EF to be 60 to 65% with normal diastolic parameters when last checked 04/17/2023

## 2024-04-12 NOTE — Assessment & Plan Note (Deleted)
 Patient was scheduled for a redo procedure of the left. This has been deferred in the setting of new right ankle fracture. - Orthopedics recommending weightbearing as tolerated to the left lower extremity in a cam boot

## 2024-04-12 NOTE — Assessment & Plan Note (Signed)
-  She does not appear to be taking medications for this issue at this time

## 2024-04-12 NOTE — Assessment & Plan Note (Signed)
 Patient presented after slipping at home, injuring her right ankle X-rays revealed minimally displaced fracture of the lateral malleolus, fracture of the medial base of the second metatarsal with widening of the first intermetatarsal space, no additional fracture involving the base of the third metatarsal along with the lateral corner of the cuboid suspected Orthopedics was consulted Patient underwent surgical treatment of her right foot ankle injury including management of multiple TMT dislocations and right ankle lateral malleolus fracture Postoperatively, orthopedics recommending nonweightbearing on right ankle and Lovenox  for DVT prophylaxis from postop day 1 Patient requested that she be given an alternative agent (other than Lovenox ) for DVT prophylaxis - started on aspirin  81 mg twice daily Patient evaluated by PT and SNF recommended NWB in splint on RLE Follow-up with orthopedics in 2 weeks She has only been accepted to 1 SNF, in [place]; she is hesitant to go there Will attempt to send out to [facility] SNFs again; if unable to gain acceptance, she may be willing to go to [place] since home really isn't a current option

## 2024-04-12 NOTE — Assessment & Plan Note (Signed)
 Patient was scheduled for a redo procedure of the left. This has been deferred in the setting of new right ankle fracture. - Orthopedics recommending weightbearing as tolerated to the left lower extremity in a cam boot

## 2024-04-12 NOTE — Assessment & Plan Note (Deleted)
 UA concerning for infection. - Nitrofurantoin  100 mg twice daily x 5 days

## 2024-04-12 NOTE — Assessment & Plan Note (Signed)
 Continue home lisinopril -hydrochlorothiazide   She reports that hydrochlorothiazide  causes polyuria, which is also a symptom listed when she has a UTI; alternative therapy may need to be considered

## 2024-04-12 NOTE — Assessment & Plan Note (Signed)
 Body mass index is 31.07 kg/m.[name]  Weight loss should be encouraged Outpatient PCP/bariatric medicine f/u encouraged Significantly low or high BMI is associated with higher medical risk including morbidity and mortality

## 2024-04-12 NOTE — Assessment & Plan Note (Deleted)
 Continue home lisinopril-hydrochlorothiazide

## 2024-04-12 NOTE — Assessment & Plan Note (Signed)
 1/4 ppd She declined nicotine  patch

## 2024-04-13 DIAGNOSIS — S82891A Other fracture of right lower leg, initial encounter for closed fracture: Secondary | ICD-10-CM | POA: Diagnosis not present

## 2024-04-13 NOTE — Assessment & Plan Note (Addendum)
 1/4 ppd She declined nicotine  patch

## 2024-04-13 NOTE — Assessment & Plan Note (Addendum)
 Patient presented after slipping at home, injuring her right ankle X-rays revealed minimally displaced fracture of the lateral malleolus, fracture of the medial base of the second metatarsal with widening of the first intermetatarsal space, no additional fracture involving the base of the third metatarsal along with the lateral corner of the cuboid suspected Orthopedics was consulted Patient underwent surgical treatment of her right foot ankle injury including management of multiple TMT dislocations and right ankle lateral malleolus fracture Postoperatively, orthopedics recommending nonweightbearing on right ankle and Lovenox  for DVT prophylaxis from postop day 1 Patient requested that she be given an alternative agent (other than Lovenox ) for DVT prophylaxis - started on aspirin  81 mg twice daily Patient evaluated by PT and SNF recommended NWB in splint on RLE Follow-up with orthopedics in 2 weeks She has only been accepted to 1 SNF, in [place]; she is hesitant to go there Will attempt to send out to [facility] SNFs again; if unable to gain acceptance, she is willing to go to [place] since home really isn't a current option

## 2024-04-13 NOTE — Assessment & Plan Note (Addendum)
 Reports recurrent infections, knows she has one because I know my body UA concerning for infection Nitrofurantoin  100 mg twice daily x 5 days Completes abx 2/1 and recommend no further urinary testing unless she has actual symptoms

## 2024-04-13 NOTE — Assessment & Plan Note (Addendum)
 Body mass index is 31.07 kg/m.[name]  Weight loss should be encouraged Outpatient PCP/bariatric medicine f/u encouraged Significantly low or high BMI is associated with higher medical risk including morbidity and mortality

## 2024-04-13 NOTE — Plan of Care (Signed)
   Problem: Coping: Goal: Level of anxiety will decrease Outcome: Progressing   Problem: Pain Managment: Goal: General experience of comfort will improve and/or be controlled Outcome: Progressing   Problem: Safety: Goal: Ability to remain free from injury will improve Outcome: Progressing

## 2024-04-13 NOTE — Progress Notes (Signed)
 " Progress Note   [name] FMW:[id number] DOB: 09-16-1975 DOA: 04/02/2024     11 DOS: the patient was seen and examined on 04/13/2024   Brief hospital course: 48yo with h/o HTN, Takotsubo cardiomyopathy, bipolar d/o, and prior L ankle fracture s/p ORIF who presented with a fall resulting in R ankle Lisfranc fracture.  Orthopedics performed ORIF on 1/22.  She is awaiting STR placement but has refused the only bed offered.   Assessment & Plan Closed fracture of right ankle Patient presented after slipping at home, injuring her right ankle X-rays revealed minimally displaced fracture of the lateral malleolus, fracture of the medial base of the second metatarsal with widening of the first intermetatarsal space, no additional fracture involving the base of the third metatarsal along with the lateral corner of the cuboid suspected Orthopedics was consulted Patient underwent surgical treatment of her right foot ankle injury including management of multiple TMT dislocations and right ankle lateral malleolus fracture Postoperatively, orthopedics recommending nonweightbearing on right ankle and Lovenox  for DVT prophylaxis from postop day 1 Patient requested that she be given an alternative agent (other than Lovenox ) for DVT prophylaxis - started on aspirin  81 mg twice daily Patient evaluated by PT and SNF recommended NWB in splint on RLE Follow-up with orthopedics in 2 weeks She has only been accepted to 1 SNF, in [place]; she is hesitant to go there Will attempt to send out to [facility] SNFs again; if unable to gain acceptance, she is willing to go to [place] [facility] since home really isn't a current option Status post ORIF of fracture of ankle Patient was scheduled for a redo procedure of the left This has been deferred in the setting of new right ankle fracture Orthopedics recommending weightbearing as tolerated to the left lower extremity in a cam boot History of  cardiomyopathy Euvolemic Last echocardiogram noted EF to be 60 to 65% with normal diastolic parameters when last checked 04/17/2023 Essential hypertension Continue home lisinopril -hydrochlorothiazide   She reports that hydrochlorothiazide  causes polyuria, which is also a symptom listed when she has a UTI; alternative therapy may need to be considered Bipolar 1 disorder (HCC) She does not appear to be taking medications for this issue at this time  Recurrent UTI Reports recurrent infections, knows she has one because I know my body UA concerning for infection Nitrofurantoin  100 mg twice daily x 5 days Completes abx 2/1 and recommend no further urinary testing unless she has actual symptoms Tobacco abuse 1/4 ppd She declined nicotine  patch Class 1 obesity due to excess calories with body mass index (BMI) of 31.0 to 31.9 in adult Body mass index is 31.07 kg/m.[name]  Weight loss should be encouraged Outpatient PCP/bariatric medicine f/u encouraged Significantly low or high BMI is associated with higher medical risk including morbidity and mortality       Consultants: Orthopedics PT OT ICM   Procedures: ORIF R ankle 1/22   Antibiotics: Cefazolin  x 1 Nitrofurantoin  1/27-2/1  30 Day Unplanned Readmission Risk Score    Flowsheet Row ED to Hosp-Admission (Current) from 04/02/2024 in [facility]  30 Day Unplanned Readmission Risk Score (%) 16.2 Filed at 04/13/2024 0401    This score is the patient's risk of an unplanned readmission within 30 days of being discharged (0 -100%). The score is based on dignosis, age, lab data, medications, orders, and past utilization.   Low:  0-14.9   Medium: 15-21.9   High: 22-29.9   Extreme: 30 and above  Subjective: Still having pain.  Poor mobility.  She appreciates the efforts to try to get a [place] facility and agrees to go to [facility] if unable to get a bed here.  Vape pen found in her bed, which she reports is empty.  It  was placed back in her bag across the room so that her daughter can take it home.   Objective: Vitals:   04/13/24 1003 04/13/24 1301  BP: (!) 163/116 (!) 145/98  Pulse:  61  Resp:  16  Temp:  98.4 F (36.9 C)  SpO2:  100%    Intake/Output Summary (Last 24 hours) at 04/13/2024 1552 Last data filed at 04/13/2024 1400 Gross per 24 hour  Intake 1380 ml  Output 1650 ml  Net -270 ml   Filed Weights   04/02/24 1230 04/03/24 0711  Weight: 82.1 kg 82.1 kg    Exam:  General:  Appears calm and comfortable and is in NAD Eyes:  normal lids, iris ENT:  grossly normal hearing, lips & tongue, mmm Cardiovascular:  RRR. No LE edema.  Respiratory:   CTA bilaterally with no wheezes/rales/rhonchi.  Normal respiratory effort. Abdomen:  soft, NT, ND Skin:  no rash or induration seen on limited exam Musculoskeletal:  R ankle soft splint in place Psychiatric:  grossly normal mood and affect, speech fluent and appropriate, AOx3 Neurologic:  CN 2-12 grossly intact, moves all extremities in coordinated fashion  Data Reviewed: I have reviewed the patient's lab results since admission.  Pertinent labs for today include:   None     Family Communication: None present  Mobility: PT/OT Consulted and are recommending - Skilled Nursing-Short Term Rehab (<3 Hours/Day)04/11/2024 1330    Code Status: Full Code    Disposition: Status is: Inpatient Remains inpatient appropriate because: awaiting placement     Time spent: 35 minutes  Unresulted Labs (From admission, onward)     Start     Ordered   04/14/24 0500  CBC  Tomorrow morning,   R        04/13/24 0609   04/14/24 0500  Basic metabolic panel with GFR  Tomorrow morning,   R        04/13/24 9390             Author: Delon Herald, MD 04/13/2024 3:52 PM  For on call review [email or web address].            "

## 2024-04-13 NOTE — Assessment & Plan Note (Addendum)
 Patient was scheduled for a redo procedure of the left. This has been deferred in the setting of new right ankle fracture. - Orthopedics recommending weightbearing as tolerated to the left lower extremity in a cam boot

## 2024-04-13 NOTE — Progress Notes (Signed)
 Mobility Specialist - Progress Note:   04/13/24 1626  Mobility  Activity Dangled on edge of bed (Bed Exercises)  Level of Assistance Independent  Range of Motion/Exercises Active  Activity Response Tolerated well  Mobility Referral Yes  Mobility visit 1 Mobility  Mobility Specialist Start Time (ACUTE ONLY) 1345  Mobility Specialist Stop Time (ACUTE ONLY) 1356  Mobility Specialist Time Calculation (min) (ACUTE ONLY) 11 min   Pt was received on the edge of bed and agreed to mobility: Seated BLE Exercises: 10 reps each  1) Knee Extension  2) Marching    3) Hip Adduction (pillow squeezes)  Pt had no complaints/issues at the end of session. Returned to bed with all needs met. Call bell in reach.  [employer] - Mobility Specialist - Acute Rehabilitation Can be reached via [employer]

## 2024-04-13 NOTE — Assessment & Plan Note (Addendum)
-  She does not appear to be taking medications for this issue at this time

## 2024-04-13 NOTE — Assessment & Plan Note (Addendum)
 Euvolemic Last echocardiogram noted EF to be 60 to 65% with normal diastolic parameters when last checked 04/17/2023

## 2024-04-13 NOTE — Assessment & Plan Note (Addendum)
 Continue home lisinopril -hydrochlorothiazide   She reports that hydrochlorothiazide  causes polyuria, which is also a symptom listed when she has a UTI; alternative therapy may need to be considered

## 2024-04-14 DIAGNOSIS — S82891A Other fracture of right lower leg, initial encounter for closed fracture: Secondary | ICD-10-CM | POA: Diagnosis not present

## 2024-04-14 LAB — CBC
HCT: 33.9 % — ABNORMAL LOW (ref 36.0–46.0)
Hemoglobin: 10.6 g/dL — ABNORMAL LOW (ref 12.0–15.0)
MCH: 27.7 pg (ref 26.0–34.0)
MCHC: 31.3 g/dL (ref 30.0–36.0)
MCV: 88.7 fL (ref 80.0–100.0)
Platelets: 247 10*3/uL (ref 150–400)
RBC: 3.82 MIL/uL — ABNORMAL LOW (ref 3.87–5.11)
RDW: 17.6 % — ABNORMAL HIGH (ref 11.5–15.5)
WBC: 5.1 10*3/uL (ref 4.0–10.5)
nRBC: 0 % (ref 0.0–0.2)

## 2024-04-14 LAB — BASIC METABOLIC PANEL WITH GFR
Anion gap: 10 (ref 5–15)
BUN: 14 mg/dL (ref 6–20)
CO2: 25 mmol/L (ref 22–32)
Calcium: 9.5 mg/dL (ref 8.9–10.3)
Chloride: 103 mmol/L (ref 98–111)
Creatinine, Ser: 0.86 mg/dL (ref 0.44–1.00)
GFR, Estimated: >60 mL/min
Glucose, Bld: 106 mg/dL — ABNORMAL HIGH (ref 70–99)
Potassium: 4.3 mmol/L (ref 3.5–5.1)
Sodium: 138 mmol/L (ref 135–145)

## 2024-04-14 MED ORDER — ASPIRIN 81 MG PO TBEC: 81.0000 mg | DELAYED_RELEASE_TABLET | Freq: Two times a day (BID) | ORAL | 12 refills | Status: AC

## 2024-04-14 NOTE — Assessment & Plan Note (Deleted)
 Patient presented after slipping at home, injuring her right ankle X-rays revealed minimally displaced fracture of the lateral malleolus, fracture of the medial base of the second metatarsal with widening of the first intermetatarsal space, no additional fracture involving the base of the third metatarsal along with the lateral corner of the cuboid suspected Orthopedics was consulted Patient underwent surgical treatment of her right foot ankle injury including management of multiple TMT dislocations and right ankle lateral malleolus fracture Postoperatively, orthopedics recommending nonweightbearing on right ankle and Lovenox  for DVT prophylaxis from postop day 1 Patient requested that she be given an alternative agent (other than Lovenox ) for DVT prophylaxis - started on aspirin  81 mg twice daily Patient evaluated by PT and SNF recommended NWB in splint on RLE Follow-up with orthopedics in 2 weeks She has only been accepted to 1 SNF, in [place]; she is hesitant to go there Will attempt to send out to [facility] SNFs again; if unable to gain acceptance, she is willing to go to [place] since home really isn't a current option

## 2024-04-14 NOTE — Assessment & Plan Note (Deleted)
 1/4 ppd She declined nicotine  patch

## 2024-04-14 NOTE — Assessment & Plan Note (Deleted)
 Patient was scheduled for a redo procedure of the left This has been deferred in the setting of new right ankle fracture Orthopedics recommending weightbearing as tolerated to the left lower extremity in a cam boot

## 2024-04-14 NOTE — Assessment & Plan Note (Addendum)
 Body mass index is 31.07 kg/m.[name]  Weight loss should be encouraged Outpatient PCP/bariatric medicine f/u encouraged Significantly low or high BMI is associated with higher medical risk including morbidity and mortality

## 2024-04-14 NOTE — Assessment & Plan Note (Deleted)
 Reports recurrent infections, knows she has one because I know my body UA concerning for infection Nitrofurantoin  100 mg twice daily x 5 days Completes abx 2/1 and recommend no further urinary testing unless she has actual symptoms

## 2024-04-14 NOTE — Assessment & Plan Note (Addendum)
 1/4 ppd She declined nicotine  patch

## 2024-04-14 NOTE — Assessment & Plan Note (Addendum)
 Euvolemic Last echocardiogram noted EF to be 60 to 65% with normal diastolic parameters when last checked 04/17/2023

## 2024-04-14 NOTE — Assessment & Plan Note (Addendum)
 Reports recurrent infections, knows she has one because I know my body UA concerning for infection Nitrofurantoin  100 mg twice daily x 5 days Completed abx 2/1 and recommend no further urinary testing unless she has actual symptoms

## 2024-04-14 NOTE — Assessment & Plan Note (Deleted)
 Continue home lisinopril -hydrochlorothiazide   She reports that hydrochlorothiazide  causes polyuria, which is also a symptom listed when she has a UTI; alternative therapy may need to be considered

## 2024-04-14 NOTE — Assessment & Plan Note (Deleted)
 Euvolemic Last echocardiogram noted EF to be 60 to 65% with normal diastolic parameters when last checked 04/17/2023

## 2024-04-14 NOTE — Progress Notes (Signed)
 Subjective: 11 Days Post-Op Procedures (LRB): OPEN REDUCTION INTERNAL FIXATION (ORIF) FOOT LISFRANC FRACTURE (Right) Patient reports pain as moderate.  Controlled with oral pain meds.  Objective: Vital signs in last 24 hours: Temp:  [97.7 F (36.5 C)-98.8 F (37.1 C)] 98.3 F (36.8 C) (02/02 0556) Pulse Rate:  [61-94] 61 (02/02 0556) Resp:  [16-18] 18 (02/02 0556) BP: (106-145)/(72-98) 131/85 (02/02 0556) SpO2:  [93 %-100 %] 100 % (02/02 0556)  Intake/Output from previous day: 02/01 0701 - 02/02 0700 In: 1140 [P.O.:1140] Out: 1900 [Urine:1900] Intake/Output this shift: No intake/output data recorded.  Recent Labs    04/14/24 0315  HGB 10.6*   Recent Labs    04/14/24 0315  WBC 5.1  RBC 3.82*  HCT 33.9*  PLT 247   Recent Labs    04/14/24 0315  NA 138  K 4.3  CL 103  CO2 25  BUN 14  CREATININE 0.86  GLUCOSE 106*  CALCIUM  9.5   No results for input(s): LABPT, INR in the last 72 hours.  PE:  wn wd woman in nad.  Right foot splinted.  Brisk cap refill at the toes.  Active PF and DF.  Splint intact.   Assessment/Plan: 11 Days Post-Op Procedures (LRB): OPEN REDUCTION INTERNAL FIXATION (ORIF) FOOT LISFRANC FRACTURE (Right) Discharge to SNF when bed available.  I reinforced the splint today.  I'll see her back in the office in 7-10 days for suture removal, wound check and casting.      [name] 04/14/2024, 11:08 AM

## 2024-04-14 NOTE — Assessment & Plan Note (Addendum)
 Patient was scheduled for a redo procedure of the left. This has been deferred in the setting of new right ankle fracture. - Orthopedics recommending weightbearing as tolerated to the left lower extremity in a cam boot

## 2024-04-14 NOTE — Progress Notes (Signed)
 Physical Therapy Treatment Patient Details Name: Kristin [name] MRN: [id number] DOB: 1976/01/25 Today's Date: 04/14/2024   History of Present Illness 49 y.o. female with medical history significant of hypertension, previous Takotsubo cardiomyopathy, bipolar disorder, and prior Left ankle ORIF and primary repair of deltoid ligament on 12/18/23 who presented with right ankle pain after a fall and was found to have a right ankle fracture (Lisfranc injury) and underwent surgical treatment of right ankle injury on 04/03/24.    PT Comments  Pt pleasant, reports desire to mobilize more. Educated pt on NWB RLE and WBAT LLE with CAM and pt verbalizes understanding. Within initial STS from bedside with RW, pt with low set RW for increased BUE assist but appears too low with pt resting RLE on floor despite max cuing. Pt returns to sitting and therapist increases height of RW, pt able to power to stand with supv, maintains RLE elevated off of floor for entire transfer to rise and return to sitting. Pt performing hopping gait pattern with min A, strong BUE assisting on RW, maintain RLE elevated off of floor, educated to avoid resting RLE on floor with rest, minimizing gait distances, using BUE on RW to minimize force in LLE with hopping motion. Educated pt on w/c use for mobility choice currently and pt verbalizes understanding. Continue to recommend continued inpatient follow up therapy, <3 hours/day as pt resides in split level home without 24/7 assistance.   If plan is discharge home, recommend the following: Assistance with cooking/housework;Assist for transportation;Help with stairs or ramp for entrance;A little help with walking and/or transfers;A little help with bathing/dressing/bathroom   Can travel by private vehicle     Yes  Equipment Recommendations       Recommendations for Other Services       Precautions / Restrictions Precautions Precautions: Fall Required Braces or Orthoses:  Splint/Cast Restrictions Weight Bearing Restrictions Per Provider Order: Yes RLE Weight Bearing Per Provider Order: Non weight bearing LLE Weight Bearing Per Provider Order: Weight bearing as tolerated (with CAM boot)     Mobility  Bed Mobility Overal bed mobility: Modified Independent             General bed mobility comments: no assist or cues    Transfers Overall transfer level: Needs assistance Equipment used: Rolling walker (2 wheels) Transfers: Sit to/from Stand Sit to Stand: Supervision           General transfer comment: educated pt on NWB RLE, on initial transfer pt appears to lack full compliance; returned to sitting, therapist readjusted RW to higher height then pt performs 2nd STS and able to complete with RLE fully elevated off of floor for entire transfer, pt reports feeling more supported and less hunched at elevated RW position and still able to utilize BUE to maintain NWB RLE    Ambulation/Gait Ambulation/Gait assistance: Min assist Gait Distance (Feet): 24 Feet Assistive device: Rolling walker (2 wheels) Gait Pattern/deviations: Step-to pattern       General Gait Details: hop-to gait pattern with L CAM boot, pt able to maintain RLE elevated off of floor with good NWB RLE compliance throughout gait, educated on safety with mobility maintaining body within RW frame and utilizing BUE to minimize hopping force on LLE in CAM, pt agreeable to w/c use for primary mobility at this time   Stairs             Wheelchair Mobility     Tilt Bed    Modified Rankin (Stroke Patients Only)  Balance Overall balance assessment: Needs assistance Sitting-balance support: Feet supported Sitting balance-Leahy Scale: Good Sitting balance - Comments: CAM boot placement and donning LB clothing while seated at bedside   Standing balance support: During functional activity, Bilateral upper extremity supported, Reliant on assistive device for  balance Standing balance-Leahy Scale: Poor Standing balance comment: reliant on RW                            Communication Communication Communication: No apparent difficulties  Cognition Arousal: Alert Behavior During Therapy: WFL for tasks assessed/performed   PT - Cognitive impairments: No apparent impairments                       PT - Cognition Comments: Pt pleasant, joking with therapist, follows commands, able to verbalize WB status and CAM boot need correctly; pt needing intermittent NWB RLE cues with fatigue with good carryover Following commands: Intact      Cueing Cueing Techniques: Verbal cues, Visual cues  Exercises      General Comments        Pertinent Vitals/Pain Pain Assessment Pain Assessment: Faces Faces Pain Scale: Hurts little more Pain Location: bil ankle/foot Pain Descriptors / Indicators: Tingling, Numbness, Discomfort Pain Intervention(s): Limited activity within patient's tolerance, Monitored during session, Premedicated before session, Repositioned    Home Living                          Prior Function            PT Goals (current goals can now be found in the care plan section) Progress towards PT goals: Progressing toward goals    Frequency    Min 3X/week      PT Plan      Co-evaluation              AM-PAC PT 6 Clicks Mobility   Outcome Measure  Help needed turning from your back to your side while in a flat bed without using bedrails?: None Help needed moving from lying on your back to sitting on the side of a flat bed without using bedrails?: None Help needed moving to and from a bed to a chair (including a wheelchair)?: A Little Help needed standing up from a chair using your arms (e.g., wheelchair or bedside chair)?: A Little Help needed to walk in hospital room?: A Lot Help needed climbing 3-5 steps with a railing? : A Lot 6 Click Score: 18    End of Session Equipment Utilized  During Treatment: Gait belt Activity Tolerance: Patient tolerated treatment well Patient left: in bed;with call bell/phone within reach;Other (comment) (seated at bedside with MD enterring room) Nurse Communication: Mobility status;Other (comment) (requesting to wash up) PT Visit Diagnosis: Other abnormalities of gait and mobility (R26.89)     Time: 8964-8948 PT Time Calculation (min) (ACUTE ONLY): 16 min  Charges:    $Gait Training: 8-22 mins PT General Charges $$ ACUTE PT VISIT: 1 Visit                     Tori Reise Hietala PT, DPT 04/14/24, 11:23 AM

## 2024-04-14 NOTE — Assessment & Plan Note (Addendum)
 Continue home lisinopril -hydrochlorothiazide   She reports that hydrochlorothiazide  causes polyuria, which is also a symptom listed when she has a UTI; alternative therapy may need to be considered

## 2024-04-14 NOTE — Assessment & Plan Note (Addendum)
-  She does not appear to be taking medications for this issue at this time

## 2024-04-14 NOTE — Assessment & Plan Note (Deleted)
 Body mass index is 31.07 kg/m.[name]  Weight loss should be encouraged Outpatient PCP/bariatric medicine f/u encouraged Significantly low or high BMI is associated with higher medical risk including morbidity and mortality

## 2024-04-14 NOTE — Assessment & Plan Note (Deleted)
-  She does not appear to be taking medications for this issue at this time

## 2024-04-15 DIAGNOSIS — S82891A Other fracture of right lower leg, initial encounter for closed fracture: Secondary | ICD-10-CM | POA: Diagnosis not present

## 2024-04-15 MED ORDER — FLUCONAZOLE 100 MG PO TABS
100.0000 mg | ORAL_TABLET | Freq: Once | ORAL | Status: AC
  Administered 2024-04-15: 100 mg via ORAL
  Filled 2024-04-15: qty 1

## 2024-04-15 NOTE — Assessment & Plan Note (Signed)
 Reports recurrent infections, knows she has one because I know my body UA concerning for infection Nitrofurantoin  100 mg twice daily x 5 days Completed abx 2/1 and recommend no further urinary testing unless she has actual symptoms

## 2024-04-15 NOTE — Assessment & Plan Note (Signed)
 1/4 ppd She declined nicotine  patch

## 2024-04-15 NOTE — TOC Progression Note (Signed)
 Transition of Care [facility]) - Progression Note    Patient Details  Name: Kristin [name] MRN: [id number] Date of Birth: September 11, 1975  Transition of Care [facility]) CM/SW Contact  Alfonse JONELLE Rex, RN Phone Number: 04/15/2024, 10:21 AM  Clinical Narrative:  Text from [facility]ia[facility], bed will be available for admit tomorrow, team notified.      Expected Discharge Plan: Skilled Nursing Facility Barriers to Discharge: Continued Medical Work up               Expected Discharge Plan and Services       Living arrangements for the past 2 months: Homeless Expected Discharge Date: 04/15/24                                     Social Drivers of Health (SDOH) Interventions SDOH Screenings   Food Insecurity: Food Insecurity Present (04/02/2024)  Housing: High Risk (04/02/2024)  Transportation Needs: No Transportation Needs (04/02/2024)  Utilities: At Risk (04/02/2024)  Depression (PHQ2-9): High Risk (08/24/2023)  Social Connections: Unknown (12/19/2021)   Received from [facility]  Tobacco Use: High Risk (04/03/2024)    Readmission Risk Interventions    04/04/2024    1:57 PM 12/19/2023    3:38 PM  Readmission Risk Prevention Plan  Transportation Screening Complete Complete  PCP or Specialist Appt within 5-7 Days Complete Complete  Home Care Screening Complete Complete  Medication Review (RN CM) Complete Complete

## 2024-04-15 NOTE — Assessment & Plan Note (Signed)
-  She does not appear to be taking medications for this issue at this time

## 2024-04-15 NOTE — Assessment & Plan Note (Signed)
 Continue home lisinopril -hydrochlorothiazide   She reports that hydrochlorothiazide  causes polyuria, which is also a symptom listed when she has a UTI; alternative therapy may need to be considered

## 2024-04-15 NOTE — Assessment & Plan Note (Signed)
 Euvolemic Last echocardiogram noted EF to be 60 to 65% with normal diastolic parameters when last checked 04/17/2023

## 2024-04-15 NOTE — Assessment & Plan Note (Signed)
 Body mass index is 31.07 kg/m.[name]  Weight loss should be encouraged Outpatient PCP/bariatric medicine f/u encouraged Significantly low or high BMI is associated with higher medical risk including morbidity and mortality

## 2024-04-15 NOTE — Assessment & Plan Note (Signed)
 Patient was scheduled for a redo procedure of the left. This has been deferred in the setting of new right ankle fracture. - Orthopedics recommending weightbearing as tolerated to the left lower extremity in a cam boot

## 2024-04-16 NOTE — Progress Notes (Signed)
 Patient is waiting for discharge to SNF.  She is currently medically stable for discharge to SNF.  Please refer to the full discharge summary done by Dr. Barbarann on 04/15/2024.  Patient seen and examined at bedside today.

## 2024-04-16 NOTE — TOC Transition Note (Addendum)
 Transition of Care [facility]) - Discharge Note   Patient Details  Name: Kristin [name] MRN: [id number] Date of Birth: Dec 31, 1975  Transition of Care [facility]) CM/SW Contact:  Alfonse JONELLE Rex, RN Phone Number: 04/16/2024, 11:04 AM   Clinical Narrative:   DC order to [facility]. Call to [facility] admitting, confirmed bed available for admit today, request admit after 1:00pm. RM C103, Call report [phone]. PTAR for transport. No further CM needs at this time.       Barriers to Discharge: Continued Medical Work up   Patient Goals and CMS Choice   CMS Medicare.gov Compare Post Acute Care list provided to:: Patient Choice offered to / list presented to : Patient [place] ownership interest in [facility].provided to:: Patient    Discharge Placement                       Discharge Plan and Services Additional resources added to the After Visit Summary for                                       Social Drivers of Health (SDOH) Interventions SDOH Screenings   Food Insecurity: Food Insecurity Present (04/02/2024)  Housing: High Risk (04/02/2024)  Transportation Needs: No Transportation Needs (04/02/2024)  Utilities: At Risk (04/02/2024)  Depression (PHQ2-9): High Risk (08/24/2023)  Social Connections: Unknown (12/19/2021)   Received from [facility]  Tobacco Use: High Risk (04/03/2024)     Readmission Risk Interventions    04/04/2024    1:57 PM 12/19/2023    3:38 PM  Readmission Risk Prevention Plan  Transportation Screening Complete Complete  PCP or Specialist Appt within 5-7 Days Complete Complete  Home Care Screening Complete Complete  Medication Review (RN CM) Complete Complete
